# Patient Record
Sex: Male | Born: 1980
Health system: Southern US, Community
[De-identification: ages and names within clinical notes are randomized; demographics above are authoritative.]

## PROBLEM LIST (undated history)

## (undated) DIAGNOSIS — G8190 Hemiplegia, unspecified affecting unspecified side: Secondary | ICD-10-CM

## (undated) DIAGNOSIS — G8110 Spastic hemiplegia affecting unspecified side: Secondary | ICD-10-CM

## (undated) DIAGNOSIS — K219 Gastro-esophageal reflux disease without esophagitis: Secondary | ICD-10-CM

## (undated) DIAGNOSIS — N319 Neuromuscular dysfunction of bladder, unspecified: Secondary | ICD-10-CM

## (undated) DIAGNOSIS — Z720 Tobacco use: Secondary | ICD-10-CM

## (undated) DIAGNOSIS — I639 Cerebral infarction, unspecified: Secondary | ICD-10-CM

## (undated) DIAGNOSIS — E7211 Homocystinuria: Secondary | ICD-10-CM

## (undated) DIAGNOSIS — R4701 Aphasia: Secondary | ICD-10-CM

---

## 2018-02-07 DIAGNOSIS — N319 Neuromuscular dysfunction of bladder, unspecified: Secondary | ICD-10-CM

## 2018-02-07 DIAGNOSIS — G8190 Hemiplegia, unspecified affecting unspecified side: Secondary | ICD-10-CM

## 2018-02-07 HISTORY — DX: Hemiplegia, unspecified affecting unspecified side: G81.90

## 2018-02-07 HISTORY — DX: Neuromuscular dysfunction of bladder, unspecified: N31.9

## 2018-02-19 ENCOUNTER — Emergency Department (HOSPITAL_COMMUNITY): Payer: BLUE CROSS/BLUE SHIELD

## 2018-02-19 ENCOUNTER — Inpatient Hospital Stay (HOSPITAL_COMMUNITY): Payer: BLUE CROSS/BLUE SHIELD

## 2018-02-19 ENCOUNTER — Other Ambulatory Visit: Payer: Self-pay

## 2018-02-19 ENCOUNTER — Encounter (HOSPITAL_COMMUNITY): Payer: Self-pay

## 2018-02-19 ENCOUNTER — Encounter (HOSPITAL_COMMUNITY)
Admission: EM | Disposition: A | Discharge status: Rehabilitation Facility | Payer: Self-pay | Source: Home / Self Care | Attending: Neurology

## 2018-02-19 ENCOUNTER — Emergency Department (HOSPITAL_COMMUNITY): Payer: BLUE CROSS/BLUE SHIELD | Admitting: Anesthesiology

## 2018-02-19 ENCOUNTER — Inpatient Hospital Stay (HOSPITAL_COMMUNITY)
Admission: EM | Admit: 2018-02-19 | Discharge: 2018-03-01 | DRG: 023 | Disposition: A | Discharge status: Rehabilitation Facility | Payer: BLUE CROSS/BLUE SHIELD | Attending: Neurology | Admitting: Neurology

## 2018-02-19 DIAGNOSIS — I7771 Dissection of carotid artery: Secondary | ICD-10-CM | POA: Diagnosis not present

## 2018-02-19 DIAGNOSIS — R29718 NIHSS score 18: Secondary | ICD-10-CM | POA: Diagnosis not present

## 2018-02-19 DIAGNOSIS — G8110 Spastic hemiplegia affecting unspecified side: Secondary | ICD-10-CM | POA: Diagnosis not present

## 2018-02-19 DIAGNOSIS — I358 Other nonrheumatic aortic valve disorders: Secondary | ICD-10-CM

## 2018-02-19 DIAGNOSIS — G8191 Hemiplegia, unspecified affecting right dominant side: Secondary | ICD-10-CM

## 2018-02-19 DIAGNOSIS — I69390 Apraxia following cerebral infarction: Secondary | ICD-10-CM | POA: Diagnosis not present

## 2018-02-19 DIAGNOSIS — N319 Neuromuscular dysfunction of bladder, unspecified: Secondary | ICD-10-CM | POA: Diagnosis not present

## 2018-02-19 DIAGNOSIS — I63412 Cerebral infarction due to embolism of left middle cerebral artery: Secondary | ICD-10-CM

## 2018-02-19 DIAGNOSIS — D72829 Elevated white blood cell count, unspecified: Secondary | ICD-10-CM | POA: Diagnosis not present

## 2018-02-19 DIAGNOSIS — I63312 Cerebral infarction due to thrombosis of left middle cerebral artery: Secondary | ICD-10-CM | POA: Diagnosis not present

## 2018-02-19 DIAGNOSIS — I6602 Occlusion and stenosis of left middle cerebral artery: Secondary | ICD-10-CM | POA: Diagnosis not present

## 2018-02-19 DIAGNOSIS — G9340 Encephalopathy, unspecified: Secondary | ICD-10-CM | POA: Diagnosis not present

## 2018-02-19 DIAGNOSIS — I63512 Cerebral infarction due to unspecified occlusion or stenosis of left middle cerebral artery: Secondary | ICD-10-CM | POA: Diagnosis not present

## 2018-02-19 DIAGNOSIS — F17210 Nicotine dependence, cigarettes, uncomplicated: Secondary | ICD-10-CM | POA: Diagnosis not present

## 2018-02-19 DIAGNOSIS — L723 Sebaceous cyst: Secondary | ICD-10-CM | POA: Diagnosis not present

## 2018-02-19 DIAGNOSIS — J9601 Acute respiratory failure with hypoxia: Secondary | ICD-10-CM | POA: Diagnosis not present

## 2018-02-19 DIAGNOSIS — I6522 Occlusion and stenosis of left carotid artery: Secondary | ICD-10-CM | POA: Diagnosis not present

## 2018-02-19 DIAGNOSIS — E7211 Homocystinuria: Secondary | ICD-10-CM | POA: Diagnosis not present

## 2018-02-19 DIAGNOSIS — I69351 Hemiplegia and hemiparesis following cerebral infarction affecting right dominant side: Secondary | ICD-10-CM | POA: Diagnosis not present

## 2018-02-19 DIAGNOSIS — I639 Cerebral infarction, unspecified: Secondary | ICD-10-CM | POA: Diagnosis not present

## 2018-02-19 DIAGNOSIS — Z823 Family history of stroke: Secondary | ICD-10-CM

## 2018-02-19 DIAGNOSIS — I6300 Cerebral infarction due to thrombosis of unspecified precerebral artery: Secondary | ICD-10-CM | POA: Diagnosis not present

## 2018-02-19 DIAGNOSIS — R1312 Dysphagia, oropharyngeal phase: Secondary | ICD-10-CM | POA: Diagnosis present

## 2018-02-19 DIAGNOSIS — K219 Gastro-esophageal reflux disease without esophagitis: Secondary | ICD-10-CM | POA: Diagnosis not present

## 2018-02-19 DIAGNOSIS — R1310 Dysphagia, unspecified: Secondary | ICD-10-CM | POA: Diagnosis not present

## 2018-02-19 DIAGNOSIS — I63032 Cerebral infarction due to thrombosis of left carotid artery: Secondary | ICD-10-CM | POA: Diagnosis not present

## 2018-02-19 DIAGNOSIS — Z9289 Personal history of other medical treatment: Secondary | ICD-10-CM

## 2018-02-19 DIAGNOSIS — F17200 Nicotine dependence, unspecified, uncomplicated: Secondary | ICD-10-CM | POA: Diagnosis present

## 2018-02-19 DIAGNOSIS — J9690 Respiratory failure, unspecified, unspecified whether with hypoxia or hypercapnia: Secondary | ICD-10-CM

## 2018-02-19 DIAGNOSIS — I69391 Dysphagia following cerebral infarction: Secondary | ICD-10-CM | POA: Diagnosis not present

## 2018-02-19 DIAGNOSIS — Z452 Encounter for adjustment and management of vascular access device: Secondary | ICD-10-CM | POA: Diagnosis not present

## 2018-02-19 DIAGNOSIS — I63511 Cerebral infarction due to unspecified occlusion or stenosis of right middle cerebral artery: Secondary | ICD-10-CM | POA: Diagnosis not present

## 2018-02-19 DIAGNOSIS — R7881 Bacteremia: Secondary | ICD-10-CM | POA: Diagnosis not present

## 2018-02-19 DIAGNOSIS — I359 Nonrheumatic aortic valve disorder, unspecified: Secondary | ICD-10-CM | POA: Diagnosis not present

## 2018-02-19 DIAGNOSIS — I69332 Monoplegia of upper limb following cerebral infarction affecting left dominant side: Secondary | ICD-10-CM | POA: Diagnosis not present

## 2018-02-19 DIAGNOSIS — R0689 Other abnormalities of breathing: Secondary | ICD-10-CM | POA: Diagnosis not present

## 2018-02-19 DIAGNOSIS — I63413 Cerebral infarction due to embolism of bilateral middle cerebral arteries: Principal | ICD-10-CM | POA: Diagnosis present

## 2018-02-19 DIAGNOSIS — E876 Hypokalemia: Secondary | ICD-10-CM | POA: Diagnosis not present

## 2018-02-19 DIAGNOSIS — E870 Hyperosmolality and hypernatremia: Secondary | ICD-10-CM | POA: Diagnosis not present

## 2018-02-19 DIAGNOSIS — L729 Follicular cyst of the skin and subcutaneous tissue, unspecified: Secondary | ICD-10-CM | POA: Diagnosis not present

## 2018-02-19 DIAGNOSIS — Z4682 Encounter for fitting and adjustment of non-vascular catheter: Secondary | ICD-10-CM | POA: Diagnosis not present

## 2018-02-19 DIAGNOSIS — E871 Hypo-osmolality and hyponatremia: Secondary | ICD-10-CM | POA: Diagnosis not present

## 2018-02-19 DIAGNOSIS — I9581 Postprocedural hypotension: Secondary | ICD-10-CM | POA: Diagnosis not present

## 2018-02-19 DIAGNOSIS — R130 Aphagia: Secondary | ICD-10-CM | POA: Diagnosis not present

## 2018-02-19 DIAGNOSIS — R1311 Dysphagia, oral phase: Secondary | ICD-10-CM | POA: Diagnosis present

## 2018-02-19 DIAGNOSIS — E538 Deficiency of other specified B group vitamins: Secondary | ICD-10-CM | POA: Diagnosis not present

## 2018-02-19 DIAGNOSIS — R339 Retention of urine, unspecified: Secondary | ICD-10-CM | POA: Diagnosis not present

## 2018-02-19 DIAGNOSIS — G935 Compression of brain: Secondary | ICD-10-CM | POA: Diagnosis not present

## 2018-02-19 DIAGNOSIS — I6389 Other cerebral infarction: Secondary | ICD-10-CM | POA: Diagnosis not present

## 2018-02-19 DIAGNOSIS — R29810 Facial weakness: Secondary | ICD-10-CM | POA: Diagnosis not present

## 2018-02-19 DIAGNOSIS — R0902 Hypoxemia: Secondary | ICD-10-CM | POA: Diagnosis not present

## 2018-02-19 DIAGNOSIS — Z789 Other specified health status: Secondary | ICD-10-CM | POA: Diagnosis not present

## 2018-02-19 DIAGNOSIS — R29818 Other symptoms and signs involving the nervous system: Secondary | ICD-10-CM | POA: Diagnosis not present

## 2018-02-19 DIAGNOSIS — R296 Repeated falls: Secondary | ICD-10-CM | POA: Diagnosis present

## 2018-02-19 DIAGNOSIS — K592 Neurogenic bowel, not elsewhere classified: Secondary | ICD-10-CM | POA: Diagnosis not present

## 2018-02-19 DIAGNOSIS — I609 Nontraumatic subarachnoid hemorrhage, unspecified: Secondary | ICD-10-CM | POA: Diagnosis not present

## 2018-02-19 DIAGNOSIS — R4701 Aphasia: Secondary | ICD-10-CM | POA: Diagnosis present

## 2018-02-19 DIAGNOSIS — G936 Cerebral edema: Secondary | ICD-10-CM | POA: Diagnosis not present

## 2018-02-19 DIAGNOSIS — Z9889 Other specified postprocedural states: Secondary | ICD-10-CM | POA: Diagnosis not present

## 2018-02-19 DIAGNOSIS — I63232 Cerebral infarction due to unspecified occlusion or stenosis of left carotid arteries: Secondary | ICD-10-CM | POA: Diagnosis not present

## 2018-02-19 DIAGNOSIS — B957 Other staphylococcus as the cause of diseases classified elsewhere: Secondary | ICD-10-CM | POA: Diagnosis not present

## 2018-02-19 DIAGNOSIS — I69320 Aphasia following cerebral infarction: Secondary | ICD-10-CM | POA: Diagnosis not present

## 2018-02-19 DIAGNOSIS — I339 Acute and subacute endocarditis, unspecified: Secondary | ICD-10-CM | POA: Diagnosis not present

## 2018-02-19 DIAGNOSIS — I330 Acute and subacute infective endocarditis: Secondary | ICD-10-CM | POA: Diagnosis not present

## 2018-02-19 DIAGNOSIS — R159 Full incontinence of feces: Secondary | ICD-10-CM | POA: Diagnosis not present

## 2018-02-19 DIAGNOSIS — I10 Essential (primary) hypertension: Secondary | ICD-10-CM | POA: Diagnosis not present

## 2018-02-19 DIAGNOSIS — J984 Other disorders of lung: Secondary | ICD-10-CM | POA: Diagnosis not present

## 2018-02-19 DIAGNOSIS — J9811 Atelectasis: Secondary | ICD-10-CM | POA: Diagnosis not present

## 2018-02-19 DIAGNOSIS — Z23 Encounter for immunization: Secondary | ICD-10-CM | POA: Diagnosis not present

## 2018-02-19 DIAGNOSIS — L089 Local infection of the skin and subcutaneous tissue, unspecified: Secondary | ICD-10-CM | POA: Diagnosis not present

## 2018-02-19 HISTORY — PX: IR PERCUTANEOUS ART THROMBECTOMY/INFUSION INTRACRANIAL INC DIAG ANGIO: IMG6087

## 2018-02-19 HISTORY — PX: IR ANGIO VERTEBRAL SEL VERTEBRAL BILAT MOD SED: IMG5369

## 2018-02-19 HISTORY — PX: RADIOLOGY WITH ANESTHESIA: SHX6223

## 2018-02-19 HISTORY — PX: IR ANGIO INTRA EXTRACRAN SEL COM CAROTID INNOMINATE UNI R MOD SED: IMG5359

## 2018-02-19 HISTORY — PX: IR US GUIDE VASC ACCESS RIGHT: IMG2390

## 2018-02-19 HISTORY — PX: IR CT HEAD LTD: IMG2386

## 2018-02-19 HISTORY — DX: Tobacco use: Z72.0

## 2018-02-19 LAB — POCT I-STAT 3, ART BLOOD GAS (G3+)
Acid-base deficit: 6 mmol/L — ABNORMAL HIGH (ref 0.0–2.0)
Bicarbonate: 19.8 mmol/L — ABNORMAL LOW (ref 20.0–28.0)
O2 Saturation: 100 %
PCO2 ART: 37.9 mmHg (ref 32.0–48.0)
PO2 ART: 218 mmHg — AB (ref 83.0–108.0)
Patient temperature: 98.9
TCO2: 21 mmol/L — AB (ref 22–32)
pH, Arterial: 7.328 — ABNORMAL LOW (ref 7.350–7.450)

## 2018-02-19 LAB — COMPREHENSIVE METABOLIC PANEL
ALBUMIN: 3.4 g/dL — AB (ref 3.5–5.0)
ALT: 33 U/L (ref 0–44)
ANION GAP: 13 (ref 5–15)
AST: 28 U/L (ref 15–41)
Alkaline Phosphatase: 96 U/L (ref 38–126)
BILIRUBIN TOTAL: 1.1 mg/dL (ref 0.3–1.2)
BUN: <5 mg/dL — ABNORMAL LOW (ref 6–20)
CO2: 22 mmol/L (ref 22–32)
Calcium: 8.7 mg/dL — ABNORMAL LOW (ref 8.9–10.3)
Chloride: 105 mmol/L (ref 98–111)
Creatinine, Ser: 0.73 mg/dL (ref 0.61–1.24)
GFR calc Af Amer: >60 mL/min (ref >60)
GFR calc non Af Amer: >60 mL/min (ref >60)
GLUCOSE: 141 mg/dL — AB (ref 70–99)
POTASSIUM: 3.8 mmol/L (ref 3.5–5.1)
Sodium: 140 mmol/L (ref 135–145)
TOTAL PROTEIN: 7.2 g/dL (ref 6.5–8.1)

## 2018-02-19 LAB — CBC
HCT: 48.7 % (ref 39.0–52.0)
Hemoglobin: 18 g/dL — ABNORMAL HIGH (ref 13.0–17.0)
MCH: 39 pg — ABNORMAL HIGH (ref 26.0–34.0)
MCHC: 37 g/dL — ABNORMAL HIGH (ref 30.0–36.0)
MCV: 105.6 fL — AB (ref 78.0–100.0)
PLATELETS: 265 10*3/uL (ref 150–400)
RBC: 4.61 MIL/uL (ref 4.22–5.81)
RDW: 13 % (ref 11.5–15.5)
WBC: 15.4 10*3/uL — AB (ref 4.0–10.5)

## 2018-02-19 LAB — I-STAT CHEM 8, ED
BUN: <3 mg/dL — ABNORMAL LOW (ref 6–20)
CREATININE: 0.8 mg/dL (ref 0.61–1.24)
Calcium, Ion: 0.97 mmol/L — ABNORMAL LOW (ref 1.15–1.40)
Chloride: 101 mmol/L (ref 98–111)
GLUCOSE: 137 mg/dL — AB (ref 70–99)
HEMATOCRIT: 53 % — AB (ref 39.0–52.0)
HEMOGLOBIN: 18 g/dL — AB (ref 13.0–17.0)
Potassium: 3.5 mmol/L (ref 3.5–5.1)
SODIUM: 139 mmol/L (ref 135–145)
TCO2: 23 mmol/L (ref 22–32)

## 2018-02-19 LAB — DIFFERENTIAL
Abs Immature Granulocytes: 0.1 10*3/uL (ref 0.0–0.1)
BASOS ABS: 0.1 10*3/uL (ref 0.0–0.1)
Basophils Relative: 0 %
EOS ABS: 0 10*3/uL (ref 0.0–0.7)
EOS PCT: 0 %
IMMATURE GRANULOCYTES: 1 %
LYMPHS ABS: 2.6 10*3/uL (ref 0.7–4.0)
Lymphocytes Relative: 17 %
Monocytes Absolute: 0.9 10*3/uL (ref 0.1–1.0)
Monocytes Relative: 6 %
Neutro Abs: 11.8 10*3/uL — ABNORMAL HIGH (ref 1.7–7.7)
Neutrophils Relative %: 76 %

## 2018-02-19 LAB — RAPID URINE DRUG SCREEN, HOSP PERFORMED
AMPHETAMINES: NOT DETECTED
BENZODIAZEPINES: NOT DETECTED
Cocaine: NOT DETECTED
OPIATES: NOT DETECTED
Tetrahydrocannabinol: NOT DETECTED

## 2018-02-19 LAB — I-STAT TROPONIN, ED: TROPONIN I, POC: 0 ng/mL (ref 0.00–0.08)

## 2018-02-19 LAB — TRIGLYCERIDES: TRIGLYCERIDES: 130 mg/dL (ref <150)

## 2018-02-19 LAB — MRSA PCR SCREENING: MRSA by PCR: POSITIVE — AB

## 2018-02-19 LAB — PROTIME-INR
INR: 1.02
PROTHROMBIN TIME: 13.3 s (ref 11.4–15.2)

## 2018-02-19 LAB — FOLATE: Folate: 1.5 ng/mL — ABNORMAL LOW (ref >5.9)

## 2018-02-19 LAB — APTT: APTT: 29 s (ref 24–36)

## 2018-02-19 LAB — VITAMIN B12: Vitamin B-12: 224 pg/mL (ref 180–914)

## 2018-02-19 SURGERY — IR WITH ANESTHESIA
Anesthesia: General

## 2018-02-19 MED ORDER — IOHEXOL 300 MG/ML  SOLN: 100.0000 mL | Freq: Once | INTRAMUSCULAR | Status: DC | PRN

## 2018-02-19 MED ORDER — IOPAMIDOL (ISOVUE-370) INJECTION 76%
90.0000 mL | Freq: Once | INTRAVENOUS | Status: AC | PRN
  Administered 2018-02-19: 90 mL via INTRAVENOUS

## 2018-02-19 MED ORDER — PROPOFOL 10 MG/ML IV BOLUS
INTRAVENOUS | Status: DC | PRN
  Administered 2018-02-19: 50 mg via INTRAVENOUS
  Administered 2018-02-19: 130 mg via INTRAVENOUS

## 2018-02-19 MED ORDER — SODIUM CHLORIDE 0.9 % IV SOLN
INTRAVENOUS | Status: DC | PRN
  Administered 2018-02-19: 15 ug/min via INTRAVENOUS

## 2018-02-19 MED ORDER — NICARDIPINE HCL IN NACL 20-0.86 MG/200ML-% IV SOLN
0.0000 mg/h | INTRAVENOUS | Status: DC
  Filled 2018-02-19: qty 200

## 2018-02-19 MED ORDER — CHLORHEXIDINE GLUCONATE CLOTH 2 % EX PADS
6.0000 | MEDICATED_PAD | Freq: Every day | CUTANEOUS | Status: DC
  Administered 2018-02-21 – 2018-02-22 (×2): 6 via TOPICAL

## 2018-02-19 MED ORDER — ONDANSETRON HCL 4 MG/2ML IJ SOLN
INTRAMUSCULAR | Status: DC | PRN
  Administered 2018-02-19: 4 mg via INTRAVENOUS

## 2018-02-19 MED ORDER — CEFAZOLIN SODIUM-DEXTROSE 2-3 GM-%(50ML) IV SOLR
INTRAVENOUS | Status: DC | PRN
  Administered 2018-02-19: 2 g via INTRAVENOUS

## 2018-02-19 MED ORDER — DOCUSATE SODIUM 50 MG/5ML PO LIQD: 100.0000 mg | Freq: Two times a day (BID) | ORAL | Status: DC | PRN

## 2018-02-19 MED ORDER — LIDOCAINE HCL (CARDIAC) PF 100 MG/5ML IV SOSY
PREFILLED_SYRINGE | INTRAVENOUS | Status: DC | PRN
  Administered 2018-02-19: 60 mg via INTRAVENOUS

## 2018-02-19 MED ORDER — ORAL CARE MOUTH RINSE
15.0000 mL | OROMUCOSAL | Status: DC
  Administered 2018-02-19 – 2018-02-22 (×24): 15 mL via OROMUCOSAL

## 2018-02-19 MED ORDER — MUPIROCIN 2 % EX OINT
1.0000 "application " | TOPICAL_OINTMENT | Freq: Two times a day (BID) | CUTANEOUS | Status: AC
  Administered 2018-02-20 – 2018-02-24 (×9): 1 via NASAL
  Filled 2018-02-19 (×2): qty 22

## 2018-02-19 MED ORDER — CLEVIDIPINE BUTYRATE 0.5 MG/ML IV EMUL
0.0000 mg/h | INTRAVENOUS | Status: DC
  Administered 2018-02-19 – 2018-02-21 (×2): 1 mg/h via INTRAVENOUS
  Administered 2018-02-22: 5 mg/h via INTRAVENOUS
  Administered 2018-02-22: 6 mg/h via INTRAVENOUS
  Administered 2018-02-23: 2 mg/h via INTRAVENOUS
  Filled 2018-02-19 (×6): qty 50

## 2018-02-19 MED ORDER — ACETAMINOPHEN 650 MG RE SUPP: 650.0000 mg | RECTAL | Status: DC | PRN

## 2018-02-19 MED ORDER — TICAGRELOR 90 MG PO TABS
ORAL_TABLET | ORAL | Status: AC
  Filled 2018-02-19: qty 2

## 2018-02-19 MED ORDER — EPTIFIBATIDE 20 MG/10ML IV SOLN
INTRAVENOUS | Status: AC
  Filled 2018-02-19: qty 10

## 2018-02-19 MED ORDER — CEFAZOLIN SODIUM-DEXTROSE 2-4 GM/100ML-% IV SOLN
INTRAVENOUS | Status: AC
  Filled 2018-02-19: qty 100

## 2018-02-19 MED ORDER — LIDOCAINE HCL 1 % IJ SOLN
INTRAMUSCULAR | Status: AC
  Filled 2018-02-19: qty 20

## 2018-02-19 MED ORDER — FENTANYL CITRATE (PF) 100 MCG/2ML IJ SOLN
100.0000 ug | INTRAMUSCULAR | Status: DC | PRN
  Administered 2018-02-19 – 2018-02-21 (×10): 100 ug via INTRAVENOUS
  Filled 2018-02-19 (×9): qty 2

## 2018-02-19 MED ORDER — IOPAMIDOL (ISOVUE-300) INJECTION 61%
INTRAVENOUS | Status: AC
  Administered 2018-02-19: 40 mL
  Filled 2018-02-19: qty 50

## 2018-02-19 MED ORDER — STROKE: EARLY STAGES OF RECOVERY BOOK
Freq: Once | Status: AC
  Administered 2018-02-22: 1
  Filled 2018-02-19 (×2): qty 1

## 2018-02-19 MED ORDER — FENTANYL CITRATE (PF) 100 MCG/2ML IJ SOLN
INTRAMUSCULAR | Status: DC | PRN
  Administered 2018-02-19: 150 ug via INTRAVENOUS
  Administered 2018-02-19: 100 ug via INTRAVENOUS

## 2018-02-19 MED ORDER — ROCURONIUM BROMIDE 100 MG/10ML IV SOLN
INTRAVENOUS | Status: DC | PRN
  Administered 2018-02-19 (×2): 100 mg via INTRAVENOUS

## 2018-02-19 MED ORDER — ASPIRIN 325 MG PO TABS
ORAL_TABLET | ORAL | Status: AC
  Filled 2018-02-19: qty 1

## 2018-02-19 MED ORDER — PROPOFOL 500 MG/50ML IV EMUL
INTRAVENOUS | Status: DC | PRN
  Administered 2018-02-19: 50 ug/kg/min via INTRAVENOUS

## 2018-02-19 MED ORDER — FENTANYL CITRATE (PF) 250 MCG/5ML IJ SOLN
INTRAMUSCULAR | Status: AC
  Filled 2018-02-19: qty 5

## 2018-02-19 MED ORDER — SODIUM CHLORIDE 0.9 % IV SOLN
INTRAVENOUS | Status: DC
  Administered 2018-02-19 – 2018-02-20 (×2): via INTRAVENOUS
  Administered 2018-02-20: 75 mL/h via INTRAVENOUS
  Administered 2018-02-21: 02:00:00 via INTRAVENOUS

## 2018-02-19 MED ORDER — NITROGLYCERIN 1 MG/10 ML FOR IR/CATH LAB
INTRA_ARTERIAL | Status: AC
  Filled 2018-02-19: qty 10

## 2018-02-19 MED ORDER — FENTANYL CITRATE (PF) 100 MCG/2ML IJ SOLN
100.0000 ug | INTRAMUSCULAR | Status: AC | PRN
  Administered 2018-02-19 – 2018-02-21 (×3): 100 ug via INTRAVENOUS
  Filled 2018-02-19 (×4): qty 2

## 2018-02-19 MED ORDER — SODIUM CHLORIDE 0.9 % IV SOLN
INTRAVENOUS | Status: DC | PRN
  Administered 2018-02-19 (×2): via INTRAVENOUS

## 2018-02-19 MED ORDER — SUCCINYLCHOLINE CHLORIDE 20 MG/ML IJ SOLN
INTRAMUSCULAR | Status: DC | PRN
  Administered 2018-02-19: 80 mg via INTRAVENOUS

## 2018-02-19 MED ORDER — ALBUMIN HUMAN 5 % IV SOLN
INTRAVENOUS | Status: DC | PRN
  Administered 2018-02-19 (×2): via INTRAVENOUS

## 2018-02-19 MED ORDER — TIROFIBAN HCL IN NACL 5-0.9 MG/100ML-% IV SOLN
INTRAVENOUS | Status: AC
  Filled 2018-02-19: qty 100

## 2018-02-19 MED ORDER — DEXAMETHASONE SODIUM PHOSPHATE 10 MG/ML IJ SOLN
INTRAMUSCULAR | Status: DC | PRN
  Administered 2018-02-19: 10 mg via INTRAVENOUS

## 2018-02-19 MED ORDER — ACETAMINOPHEN 325 MG PO TABS: 650.0000 mg | ORAL_TABLET | ORAL | Status: DC | PRN

## 2018-02-19 MED ORDER — ACETAMINOPHEN 160 MG/5ML PO SOLN
650.0000 mg | ORAL | Status: DC | PRN
  Administered 2018-02-27: 650 mg
  Filled 2018-02-19: qty 20.3

## 2018-02-19 MED ORDER — PROPOFOL 1000 MG/100ML IV EMUL
0.0000 ug/kg/min | INTRAVENOUS | Status: DC
  Administered 2018-02-19: 50 ug/kg/min via INTRAVENOUS
  Administered 2018-02-19: 45 ug/kg/min via INTRAVENOUS
  Administered 2018-02-19: 25 ug/kg/min via INTRAVENOUS
  Administered 2018-02-20 (×3): 50 ug/kg/min via INTRAVENOUS
  Administered 2018-02-20: 45 ug/kg/min via INTRAVENOUS
  Administered 2018-02-20: 50 ug/kg/min via INTRAVENOUS
  Administered 2018-02-20: 45 ug/kg/min via INTRAVENOUS
  Administered 2018-02-20 – 2018-02-21 (×3): 50 ug/kg/min via INTRAVENOUS
  Filled 2018-02-19 (×11): qty 100

## 2018-02-19 MED ORDER — CHLORHEXIDINE GLUCONATE 0.12% ORAL RINSE (MEDLINE KIT)
15.0000 mL | Freq: Two times a day (BID) | OROMUCOSAL | Status: DC
  Administered 2018-02-19 – 2018-02-21 (×5): 15 mL via OROMUCOSAL

## 2018-02-19 MED ORDER — CLOPIDOGREL BISULFATE 300 MG PO TABS
ORAL_TABLET | ORAL | Status: AC
  Filled 2018-02-19: qty 1

## 2018-02-19 NOTE — Sedation Documentation (Signed)
Bed control called. Spoke with [facility]. Given bed assignment [id number].

## 2018-02-19 NOTE — Anesthesia Preprocedure Evaluation (Addendum)
Anesthesia Evaluation   Patient unresponsive    Reviewed: Patient's Chart, lab work & pertinent test results, Unable to perform ROS - Chart review onlyPreop documentation limited or incomplete due to emergent nature of procedure.  Airway Mallampati: II  TM Distance: >3 FB Neck ROM: Full   Comment: Difficult to examine  Dental no notable dental hx.    Pulmonary Current Smoker,    Pulmonary exam normal breath sounds clear to auscultation       Cardiovascular negative cardio ROS Normal cardiovascular exam     Neuro/Psych 1. Hypodensities left frontal lobe and left occipital parietal lobe most compatible with acute infarct. Possible emboli or watershed infarct. Negative for hemorrhage or mass-effect  Right sided weakness negative psych ROS   GI/Hepatic negative GI ROS, Neg liver ROS,   Endo/Other  negative endocrine ROS  Renal/GU negative Renal ROS     Musculoskeletal negative musculoskeletal ROS (+)   Abdominal   Peds  Hematology negative hematology ROS (+)   Anesthesia Other Findings code stroke  Reproductive/Obstetrics                           Anesthesia Physical Anesthesia Plan  ASA: III and emergent  Anesthesia Plan: General   Post-op Pain Management:    Induction: Intravenous  PONV Risk Score and Plan: 2 and Ondansetron, Dexamethasone and Treatment may vary due to age or medical condition  Airway Management Planned: Oral ETT  Additional Equipment: Arterial line  Intra-op Plan:   Post-operative Plan: Extubation in OR and Possible Post-op intubation/ventilation  Informed Consent: I have reviewed the patients History and Physical, chart, labs and discussed the procedure including the risks, benefits and alternatives for the proposed anesthesia with the patient or authorized representative who has indicated his/her understanding and acceptance.   Dental advisory given  Plan  Discussed with: CRNA  Anesthesia Plan Comments:        Anesthesia Quick Evaluation

## 2018-02-19 NOTE — Progress Notes (Signed)
Pt was incredibly agitated during trip to ct and required quite a lot of sedation in order to accurately obtain the CT scan that was emergently ordered .  Since arrival back onto the unit the pt has calmed down a lot more.  Pt still appears to be aphasic and not able to follow commands but is moving all of his extremities purposefully.

## 2018-02-19 NOTE — Sedation Documentation (Signed)
R groin and distal pulses assessed at bedside with Daine Floras, RN. See flowsheet. All questions answered. Care transferred to 4N.

## 2018-02-19 NOTE — Anesthesia Postprocedure Evaluation (Signed)
Anesthesia Post Note  Patient: Xavier Villegas  Procedure(s) Performed: IR WITH ANESTHESIA CODE STROKE (N/A )     Patient location during evaluation: SICU Anesthesia Type: General Level of consciousness: sedated Pain management: pain level controlled Vital Signs Assessment: post-procedure vital signs reviewed and stable Respiratory status: patient remains intubated per anesthesia plan Cardiovascular status: stable Postop Assessment: no apparent nausea or vomiting Anesthetic complications: no    Last Vitals:  Vitals:   02/19/18 1935 02/19/18 2000  BP:  114/71  Pulse: (!) 115 96  Resp: 16 18  Temp:  36.6 C  SpO2: 100% 99%    Last Pain:  Vitals:   02/19/18 2000  TempSrc: Axillary                 Kimori Tartaglia P Rinaldo Macqueen

## 2018-02-19 NOTE — Progress Notes (Signed)
Transported pt from CT to 4N on vent, full support 100% FIO2. Tolerated well and stable throughout.

## 2018-02-19 NOTE — Progress Notes (Addendum)
NeuroInterventional Radiology Pre-Procedure Note  History: 37 yo male with history of acute left MCA stroke, ELVO, NIHSS of 18.   He was last known well last night, outside window for tPA  Baseline mRS: 0 NIHSS:   18  CT ASPECTS: 8 CTA:   M1 occlusion, with left ICA dissection CTP:   Shows evidence of core infarct larger than the aspects reflects   The patient is a young otherwise health male with apparent embolic stroke to the left MCA secondary to presumably spontaneous left ICA dissection.  He is otherwise healthy, with no known risk factors.   He is outside of window for tPA, with dissection.   He stands to have a large infarction of the left MCA territory.   There is some inconsistency with his imaging data set, with an ASPECTS of 8, and CTP showing evidence of infarction larger than the ASPECTS would suggest.  This could mean he is progressing fast, however, he has been without MCA flow since last night.    He is within timeframe for intervention, but does have risk for reperfusion malignant edema/hemorrhage.    Dr. Leonel Ramsay and I discussed options with the family.  In this difficult decision to proceed with thrombectomy or use standard medical care, they decided within their best assessment that their family member would prefer to be aggressive and give himself the best chance for maintaining flow and preserving function.    Given our discussion and the patient's symptoms/baseline/age, we decided to proceed with mechanical thrombectomy.    The risks and benefits of the procedure were discussed with the patient/patient's family, with specific risks including: bleeding, infection, arterial injury/dissection, contrast reaction, kidney injury, need for further procedure/surgery, neurologic deficit, 10-15% risk or greater of intracranial hemorrhage/edema, cardiopulmonary collapse, death. All questions were answered.  The patient/family would like to proceed with attempt at  thrombectomy.   Plan for cerebral angiogram and attempt at mechanical thrombectomy.   Signed,  Dulcy Fanny. Earleen Newport, DO

## 2018-02-19 NOTE — ED Notes (Signed)
Patient arrived to IR, immediately transported to [facility] decision Rapid RN, and Primary ED RN is at bedside

## 2018-02-19 NOTE — Sedation Documentation (Signed)
4N Charge, Sonia Baller, RN, called. Updated on pts progress, condition and plan of care. Verified bed assignment. Receiving RN will be Susie.

## 2018-02-19 NOTE — H&P (Signed)
Neurology H&P  CC: Right sided weakness  History is obtained from:patient  HPI: Xavier Villegas is a 37 yo M with with no PMH who presents with right sided weakness and aphasia present on awakening. There was some question per EMS of some hand symptoms for the past week, but this is unclear as no family present at this time were aware of it. He was still normal at bedtime, however, then was found on the floor this morning by his girlfriend.   EMS activated a code stroke and he was treated as unclear LKW. His CT had aspect score of 8, but perfusion was suggestive of large area of infarct. Due to this discrepancy, he was taken to MRI for consideration of IR if his infarct burden appeared less than estimated by CTP.   Unfortunately, due to other patient care issues(emergency in a PICU patient), it would have been a significant delay to get an MRI. After discussion amongst the team, it was felt that given the patient's young age and relatively preserved ASPECT score, he was taken for intervention.   LKW: prior to bed, unclear exact time.  tpa given?: no, out of window Modified Rankin Scale: 0-Completely asymptomatic and back to baseline post- stroke  ROS:  Unable to obtain due to altered mental status.   PMH: None   FHx: Unable to assess secondary to patient's altered mental status.    Social History:  has no tobacco, alcohol, and drug history on file.   Exam: Current vital signs: There were no vitals taken for this visit. Vital signs in last 24 hours:    Physical Exam  Constitutional: Appears well-developed and well-nourished.  Psych: Affect appropriate to situation Eyes: No scleral injection HENT: No OP obstrucion Head: Normocephalic.  Cardiovascular: Normal rate and regular rhythm.  Respiratory: Effort normal and breath sounds normal to anterior ascultation GI: Soft.  No distension. There is no tenderness.  Skin: WDI  Neuro: Mental Status: Patient is awake, alert,  densely aphasic. When asked to given a thumbs up, he holds his hand out, but does not give thumbs up, when asked to stick out his tongue, he opens his mouth, but does not stick out tongue.  Cranial Nerves: II: Does not blink to threat. Pupils are equal, round, and reactive to light.   III,IV, VI: L gaze preference V: Facial sensation is symmetric to temperature VII: Facial movement with right weakness VIII: hearing is intact to voice Motor: He has little movement of his right arm, does lift right leg against graivty. 5/5 on the left.  Sensory: Sensation is diminished on the right  Cerebellar: Does not perform.    I have reviewed labs in epic and the results pertinent to this consultation are: CMP - unremarkable  I have reviewed the images obtained:CT head - aspect 8, CTP - estimates large infarct core, but still with 40cc penumbra even by CTP.   Primary Diagnosis:  Cerebral infarction due to embolism of right middle cerebral artery  Secondary Diagnosis: Cerebral edema   Impression: 37 yo F with R MCA occlusion. He was taken for IR as described above. His post-operative scan demonstrates some contrast staining and some patchy hemorrhage. He did have an occlusive ICA dissection, but given the risk of dual anti-platelets with ICH, was not stented. .   Recommendations: - HgbA1c, fasting lipid panel - MRI of the brain without contrast - Frequent neuro checks - Echocardiogram - Prophylactic therapy-none due to [place] - Risk factor modification - Telemetry monitoring -  PT consult, OT consult, Speech consult - Stroke team to follow    This patient is critically ill and at significant risk of neurological worsening, death and care requires constant monitoring of vital signs, hemodynamics,respiratory and cardiac monitoring, neurological assessment, discussion with family, other specialists and medical decision making of high complexity. I spent 100 minutes of neurocritical care time  in  the care of  this patient.  Roland Rack, MD [facility] [phone]  If 7pm- 7am, please page neurology on call as listed in [place]. 02/19/2018  12:24 PM

## 2018-02-19 NOTE — Anesthesia Procedure Notes (Signed)
Procedure Name: Intubation Date/Time: 02/19/2018 1:12 PM Performed by: Lance Coon, CRNA Pre-anesthesia Checklist: Patient identified, Emergency Drugs available, Suction available, Patient being monitored and Timeout performed Patient Re-evaluated:Patient Re-evaluated prior to induction Oxygen Delivery Method: Circle system utilized Preoxygenation: Pre-oxygenation with 100% oxygen Induction Type: IV induction and Rapid sequence Laryngoscope Size: Miller and 3 Grade View: Grade I Tube type: Subglottic suction tube Tube size: 8.0 mm Number of attempts: 1 Airway Equipment and Method: Stylet Placement Confirmation: ETT inserted through vocal cords under direct vision,  positive ETCO2 and breath sounds checked- equal and bilateral Secured at: 22 cm Tube secured with: Tape Dental Injury: Teeth and Oropharynx as per pre-operative assessment

## 2018-02-19 NOTE — Anesthesia Procedure Notes (Signed)
Arterial Line Insertion Start/End7/13/2019 1:10 PM, 02/19/2018 1:15 PM Performed by: Murvin Natal, MD, anesthesiologist  Patient location: OR. Preanesthetic checklist: patient identified, IV checked, site marked, surgical consent, monitors and equipment checked, pre-op evaluation, timeout performed and anesthesia consent Emergency situation Lidocaine 1% used for infiltration Left, radial was placed Catheter size: 20 Fr Hand hygiene performed  and maximum sterile barriers used   Attempts: 2 (first attempt by CRNA) Procedure performed without using ultrasound guided technique. Following insertion, dressing applied. Post procedure assessment: normal and unchanged  Patient tolerated the procedure well with no immediate complications.

## 2018-02-19 NOTE — Sedation Documentation (Addendum)
[facility] called to [employer], Therapist, sports.

## 2018-02-19 NOTE — Procedures (Signed)
Neuro-Interventional Radiology Post Cerebral Angiogram Procedure Note  History:  37 yo male presents with acute left MCA ELVO, outside of window for tPA.    Baseline mRS:  0 NIHSS:   21 Last Known Well:  "last night" ASPECTS:   8 Anesthesia    GETA IA tPA:    [No] IA Medication:  [No] Proximal or Distal:  Proximal left M1/MCA occlusion Post TICI Score:  TICI 2b  Device/time:    1st    ADAPT technique with ACE 68 13:50 2nd    Solitaire 4x40 14:01 3rd    Solitaire 4x40  14:24  Procedure:  US guided right CFA access Cerebral angiogram Mechanical thrombectomy of left M1/MCA occlusion Flat panel CT in room Closure with Angioseal device  Findings:  Left proximal cervical ICA dissection/thrombus Left M1 occlusion First pass, TICI 2a, with emboli in distal M1/M2 Second pass, TICI 2a, with persistent occlusion of M2 branch Third pass, TICI 2b  Occlusive left ICA dissection, with patent ACA and maintained cross-flow from right to left Posterior circulation perfuses the left temporal and occipital region  Flat panel CT shows SAH/ICH  Complications: ICH  Recommendations: - Right hip straight x 4 hours - admit to ICU/neurology - remain intubated - CT now - pressure below 140 SBP   Signed,  Dulcy Fanny. Earleen Newport, DO

## 2018-02-19 NOTE — ED Notes (Signed)
Patient arrived from home with [facility].  Per family patient has "problems with hand for a week, he fell on the floor last night, his girlfriend got him up into the bed, and he fell again, this morning, she could not get him up, so she called EMS". Unable to determine Last well known-time because girlfriend states that LKW is "last night"

## 2018-02-19 NOTE — ED Notes (Signed)
Patient transferred to IR staff

## 2018-02-19 NOTE — Consult Note (Signed)
PULMONARY / CRITICAL CARE MEDICINE   Name: Xavier Villegas MRN: [id number] DOB: 1981-07-06    ADMISSION DATE:  02/19/2018 CONSULTATION DATE: 02/19/2018  REFERRING MD:  Rosette Reveal  CHIEF COMPLAINT:  Right sided weakness and aphasia  HISTORY OF PRESENT ILLNESS:   37 yo male otherwise healthy coming in due to multiple falls. Unfortunately patient was sedated on the ventilator and no family around and most of the histroy was taken from the chart and staff. Patient had a sudden weakness last night and had to use assistance to go to his room after he fell on the floor. This morning his girlfriend could not get him up so he was brought in as a code stroke. CT showed left MCA stroke with Left ICA dissection with proximal left M1/MCA occlusion where he was taken for thrombectomy and post procedure CT showed SAH and hemorrhagic transformation. Patient came to the ICU intubated and paralyzed.   PAST MEDICAL HISTORY :  No Known past medical history    No Known Allergies  Medications: None   No current facility-administered medications on file prior to encounter.    No current outpatient medications on file prior to encounter.    FAMILY HISTORY:  Could not be obtained due to altered mental status  SOCIAL HISTORY: Could not be obtained due to sedation    REVIEW OF SYSTEMS:   Could not be obtained due to sedation    VITAL SIGNS: BP 125/81   Pulse 88   Temp 98.2 F (36.8 C) (Axillary)   Resp 16   Ht 6' (1.829 m)   Wt 83 kg (182 lb 15.7 oz)   SpO2 100%   BMI 24.82 kg/m   HEMODYNAMICS:    VENTILATOR SETTINGS: Vent Mode: PRVC FiO2 (%):  [100 %] 100 % Set Rate:  [16 bmp] 16 bmp Vt Set:  [580 mL] 580 mL PEEP:  [5 cmH20] 5 cmH20 Plateau Pressure:  [17 cmH20] 17 cmH20  INTAKE / OUTPUT: No intake/output data recorded.  PHYSICAL EXAMINATION: General: sedated acutely ill on mechanical ventilation Neuro:  Patient just received paralytics flacid pupils are 4 mm equal   HEENT: intubated  Cardiovascular: normal heart sounds no murmurs Lungs: clear equal air sounds bilaterally no wheezing  Abdomen:  Soft no guarding no organoemgaly  Musculoskeletal:  No edema  Skin:  No rash   LABS:  BMET Recent Labs  Lab 02/19/18 1215 02/19/18 1219  NA 140 139  K 3.8 3.5  CL 105 101  CO2 22  --   BUN <5* <3*  CREATININE 0.73 0.80  GLUCOSE 141* 137*    Electrolytes Recent Labs  Lab 02/19/18 1215  CALCIUM 8.7*    CBC Recent Labs  Lab 02/19/18 1215 02/19/18 1219  WBC 15.4*  --   HGB 18.0* 18.0*  HCT 48.7 53.0*  PLT 265  --     Coag's Recent Labs  Lab 02/19/18 1215  APTT 29  INR 1.02    Sepsis Markers No results for input(s): LATICACIDVEN, PROCALCITON, O2SATVEN in the last 168 hours.  ABG No results for input(s): PHART, PCO2ART, PO2ART in the last 168 hours.  Liver Enzymes Recent Labs  Lab 02/19/18 1215  AST 28  ALT 33  ALKPHOS 96  BILITOT 1.1  ALBUMIN 3.4*    Cardiac Enzymes No results for input(s): TROPONINI, PROBNP in the last 168 hours.  Glucose No results for input(s): GLUCAP in the last 168 hours.  Imaging Ct Angio Head W Or Wo Contrast  Result Date:  02/19/2018 CLINICAL DATA:  Stroke.  Right-sided weakness aphasia EXAM: CT ANGIOGRAPHY HEAD AND NECK CT PERFUSION BRAIN TECHNIQUE: Multidetector CT imaging of the head and neck was performed using the standard protocol during bolus administration of intravenous contrast. Multiplanar CT image reconstructions and MIPs were obtained to evaluate the vascular anatomy. Carotid stenosis measurements (when applicable) are obtained utilizing NASCET criteria, using the distal internal carotid diameter as the denominator. Multiphase CT imaging of the brain was performed following IV bolus contrast injection. Subsequent parametric perfusion maps were calculated using RAPID software. CONTRAST:  84mL ISOVUE-370 IOPAMIDOL (ISOVUE-370) INJECTION 76% COMPARISON:  CT head 02/19/2018 FINDINGS:  CTA NECK FINDINGS Aortic arch: Normal aortic arch without atherosclerotic disease or aneurysm. Proximal great vessels normal Right carotid system: Normal right carotid. No evidence of atherosclerotic disease dissection or stenosis Left carotid system: Irregularity and stenosis of the proximal left internal carotid artery compatible with dissection. No significant calcification. Intraluminal filling defect compatible with thrombus. Left internal carotid artery small in caliber but patent to the cavernous segment. There is occlusion of the supraclinoid internal carotid artery on the left. Vertebral arteries: Normal Skeleton: Normal.  Periapical cyst around right upper incisor Other neck: Negative for mass or adenopathy. Upper chest: Negative Review of the MIP images confirms the above findings CTA HEAD FINDINGS Anterior circulation: Slow flow with small caliber of the left cavernous carotid which then occludes in the supraclinoid component. Occlusion of the proximal left A1. Occlusion of left M1 segment. Occlusion of left M2 branches diffusely. Right middle cerebral artery widely patent. A2 segments patent bilaterally. Posterior circulation: Both vertebral arteries patent to the basilar. Left vertebral dominant. PICA patent bilaterally. Basilar patent. Superior cerebellar and posterior cerebral arteries patent bilaterally. Venous sinuses: Negative Anatomic variants: None Delayed phase: Not performed Review of the MIP images confirms the above findings CT Brain Perfusion Findings: CBF (<30%) Volume: 133mL Perfusion (Tmax>6.0s) volume: 135mL Mismatch Volume: 61mL Infarction Location:Left MCA territory including left basal ganglia. IMPRESSION: Large territory left MCA acute infarct. 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 Occlusion left A1 with reconstitution left A2. Occlusion left M1 and M2  segments. Call is currently in for Dr. Leonides Schanz. Electronically Signed   By: Franchot Gallo M.D.   On: 02/19/2018 12:59   Dg Chest 1 View  Result Date: 02/19/2018 CLINICAL DATA:  37 year old male with history of stroke. Post procedure chest x-ray. EXAM: CHEST  1 VIEW COMPARISON:  No priors. FINDINGS: An endotracheal tube is in place with tip 3.1 cm above the carina. Nasogastric tube extends into the proximal stomach. Ill-defined opacity in the right lung base which is coincident with the in divisum tubing, potentially artifactual. Lung volumes are low. No other definite consolidative airspace disease. No pleural effusions. No evidence of pulmonary edema. Heart size is normal. Upper mediastinal contours are within normal limits. IMPRESSION: 1. Support apparatus, as above. 2. Low lung volumes without definite radiographic evidence of acute cardiopulmonary disease. Electronically Signed   By: Vinnie Langton M.D.   On: 02/19/2018 16:24   Ct Head Wo Contrast  Result Date: 02/19/2018 CLINICAL DATA:  Stroke post intervention. EXAM: CT HEAD WITHOUT CONTRAST TECHNIQUE: Contiguous axial images were obtained from the base of the skull through the vertex without intravenous contrast. COMPARISON:  CT head 02/19/2018 FINDINGS: Brain: Large territory left MCA infarct has become more evident on CT with diffuse low-density throughout the left MCA territory including the  basal ganglia. Interval development of patchy areas of acute hemorrhage within the infarct involving the left frontal, left parietal, and left insular region. Chronic hemorrhage also in the left lateral basal ganglia. High density in the left posterior insula likely represents extravasation of contrast. Mild local mass-effect without midline shift. Ventricle size remains normal. No other acute infarct. Vascular: Diffuse arterial enhancement due to prior angiography in CTA Skull: Negative Sinuses/Orbits: Mild mucosal edema paranasal sinuses. Negative orbit  Other: None IMPRESSION: Large territory acute infarct left MCA territory. Interval development of multiple areas of patchy hemorrhage within the infarct following clot retrieval. No midline shift. The images were reviewed with Dr. Earleen Newport. Electronically Signed   By: Franchot Gallo M.D.   On: 02/19/2018 16:18   Ct Angio Neck W Or Wo Contrast  Result Date: 02/19/2018 CLINICAL DATA:  Stroke.  Right-sided weakness aphasia EXAM: CT ANGIOGRAPHY HEAD AND NECK CT PERFUSION BRAIN TECHNIQUE: Multidetector CT imaging of the head and neck was performed using the standard protocol during bolus administration of intravenous contrast. Multiplanar CT image reconstructions and MIPs were obtained to evaluate the vascular anatomy. Carotid stenosis measurements (when applicable) are obtained utilizing NASCET criteria, using the distal internal carotid diameter as the denominator. Multiphase CT imaging of the brain was performed following IV bolus contrast injection. Subsequent parametric perfusion maps were calculated using RAPID software. CONTRAST:  10mL ISOVUE-370 IOPAMIDOL (ISOVUE-370) INJECTION 76% COMPARISON:  CT head 02/19/2018 FINDINGS: CTA NECK FINDINGS Aortic arch: Normal aortic arch without atherosclerotic disease or aneurysm. Proximal great vessels normal Right carotid system: Normal right carotid. No evidence of atherosclerotic disease dissection or stenosis Left carotid system: Irregularity and stenosis of the proximal left internal carotid artery compatible with dissection. No significant calcification. Intraluminal filling defect compatible with thrombus. Left internal carotid artery small in caliber but patent to the cavernous segment. There is occlusion of the supraclinoid internal carotid artery on the left. Vertebral arteries: Normal Skeleton: Normal.  Periapical cyst around right upper incisor Other neck: Negative for mass or adenopathy. Upper chest: Negative Review of the MIP images confirms the above findings  CTA HEAD FINDINGS Anterior circulation: Slow flow with small caliber of the left cavernous carotid which then occludes in the supraclinoid component. Occlusion of the proximal left A1. Occlusion of left M1 segment. Occlusion of left M2 branches diffusely. Right middle cerebral artery widely patent. A2 segments patent bilaterally. Posterior circulation: Both vertebral arteries patent to the basilar. Left vertebral dominant. PICA patent bilaterally. Basilar patent. Superior cerebellar and posterior cerebral arteries patent bilaterally. Venous sinuses: Negative Anatomic variants: None Delayed phase: Not performed Review of the MIP images confirms the above findings CT Brain Perfusion Findings: CBF (<30%) Volume: 157mL Perfusion (Tmax>6.0s) volume: 169mL Mismatch Volume: 68mL Infarction Location:Left MCA territory including left basal ganglia. IMPRESSION: Large territory left MCA acute infarct. 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 Occlusion left A1 with reconstitution left A2. Occlusion left M1 and M2 segments. Call is currently in for Dr. Leonides Schanz. Electronically Signed   By: Franchot Gallo M.D.   On: 02/19/2018 12:59   Ct Cerebral Perfusion W Contrast  Result Date: 02/19/2018 CLINICAL DATA:  Stroke.  Right-sided weakness aphasia EXAM: CT ANGIOGRAPHY HEAD AND NECK CT PERFUSION BRAIN TECHNIQUE: Multidetector CT imaging of the head and neck was performed using the standard protocol during bolus administration of intravenous contrast. Multiplanar CT image reconstructions and MIPs were obtained to evaluate the  vascular anatomy. Carotid stenosis measurements (when applicable) are obtained utilizing NASCET criteria, using the distal internal carotid diameter as the denominator. Multiphase CT imaging of the brain was performed following IV bolus contrast injection. Subsequent parametric perfusion  maps were calculated using RAPID software. CONTRAST:  36mL ISOVUE-370 IOPAMIDOL (ISOVUE-370) INJECTION 76% COMPARISON:  CT head 02/19/2018 FINDINGS: CTA NECK FINDINGS Aortic arch: Normal aortic arch without atherosclerotic disease or aneurysm. Proximal great vessels normal Right carotid system: Normal right carotid. No evidence of atherosclerotic disease dissection or stenosis Left carotid system: Irregularity and stenosis of the proximal left internal carotid artery compatible with dissection. No significant calcification. Intraluminal filling defect compatible with thrombus. Left internal carotid artery small in caliber but patent to the cavernous segment. There is occlusion of the supraclinoid internal carotid artery on the left. Vertebral arteries: Normal Skeleton: Normal.  Periapical cyst around right upper incisor Other neck: Negative for mass or adenopathy. Upper chest: Negative Review of the MIP images confirms the above findings CTA HEAD FINDINGS Anterior circulation: Slow flow with small caliber of the left cavernous carotid which then occludes in the supraclinoid component. Occlusion of the proximal left A1. Occlusion of left M1 segment. Occlusion of left M2 branches diffusely. Right middle cerebral artery widely patent. A2 segments patent bilaterally. Posterior circulation: Both vertebral arteries patent to the basilar. Left vertebral dominant. PICA patent bilaterally. Basilar patent. Superior cerebellar and posterior cerebral arteries patent bilaterally. Venous sinuses: Negative Anatomic variants: None Delayed phase: Not performed Review of the MIP images confirms the above findings CT Brain Perfusion Findings: CBF (<30%) Volume: 156mL Perfusion (Tmax>6.0s) volume: 130mL Mismatch Volume: 17mL Infarction Location:Left MCA territory including left basal ganglia. IMPRESSION: Large territory left MCA acute infarct. Acute dissection left internal carotid artery with intraluminal thrombus and slow flow in the  left internal carotid artery which occludes in the supraclinoid segment. Occlusion left A1 with reconstitution left A2. Occlusion left M1 and M2 segments. Call is currently in for Dr. Leonides Schanz. Electronically Signed   By: Franchot Gallo M.D.   On: 02/19/2018 12:59   Ct Head Code Stroke Wo Contrast  Result Date: 02/19/2018 CLINICAL DATA:  Code stroke.  Right-sided weakness, aphasia EXAM: CT HEAD WITHOUT CONTRAST TECHNIQUE: Contiguous axial images were obtained from the base of the skull through the vertex without intravenous contrast. COMPARISON:  None. FINDINGS: Brain: Hypodensity left occipital parietal cortex and white matter compatible with acute infarct. Patchy hypodensity in the left frontal lobe also consistent with infarct which is probably acute. Negative for hemorrhage.  Ventricle size normal.  No mass lesion. Vascular: Negative for hyperdense vessel Skull: Negative Sinuses/Orbits: Mild mucosal edema paranasal sinuses.  Normal orbit Other: None ASPECTS ([place] Stroke Program Early CT Score) - Ganglionic level infarction (caudate, lentiform nuclei, internal capsule, insula, M1-M3 cortex): 7 - Supraganglionic infarction (M4-M6 cortex): 2 Total score (0-10 with 10 being normal): 8 IMPRESSION: 1. Hypodensities left frontal lobe and left occipital parietal lobe most compatible with acute infarct. Possible emboli or watershed infarct. Negative for hemorrhage or mass-effect 2. ASPECTS is 8 3. These results were called by telephone at the time of interpretation on 02/19/2018 at 12:29 pm to Dr. Leonel Ramsay , who verbally acknowledged these results. Electronically Signed   By: Franchot Gallo M.D.   On: 02/19/2018 12:31     STUDIES:    CULTURES: None   ANTIBIOTICS: None   SIGNIFICANT EVENTS: Thrombectomy and hemorrhagic transformation 7/13  LINES/TUBES: ETT 7/13 >>  DISCUSSION: 37 yr old male coming in with aphasia and  right sided weakness with large left MCA infarct due to M1 occlusion and  spontaneous left ICA dissection not a candidate for TPA due to coming beyond the window was taken for emergent thrombectomy shortly after he had a ICH transformation   ASSESSMENT / PLAN:  PULMONARY A: Acute respiratory insufficiency requiring mechanical ventilation for airway protection   P:   VAP protocol - not candidate for weaning - adjust PEEP and FIO2 to keep Pox>92%  CARDIOVASCULAR A:  Hypotension post procedure  P:  - Required neosynephrine transiently - keep SBP 120-140 - use cardene if needed   RENAL A:   No acute renal issues  P:   - follow urine output and renal function  - NS at 75ml/hr    GASTROINTESTINAL A:   No issues   P:   - PPI for GI prophylaxis - keep NPO for now   HEMATOLOGIC A:   Leukocytosis  P:  - ?possible reactive no signs of infections   INFECTIOUS A:   No signs of infections  P:   No ABx for now   ENDOCRINE A:   No issues     P:   Frequent glucose monitoring    NEUROLOGIC A:   Acute left MCA stroke with left ICA spontaneous disection and M1 occlusion s/p thrombectomy with reperfusion hemorrhagic transformation   P:   RASS goal: -2 - daily sedation vacation - frequent monitoring - assess for any signs of increased ICP ?need for craniotomy - keep SBP 120-140  - neuro is primary - we appreciate the consult we will continue following    Patient is acutely ill with very high risk of morbidity and mortality with guarded prognosis.    I have spent 40 minutes of critical care time this time was spent at bedside or in the unit this time was exclusive of any billable procedures patient is needing intensive care unit due to acute respiratory insufficiency requiring mechanical ventilation acute stroke with hemorrhagic transformation requiring frequent neuro monitoring and close observation of vital signs.  FAMILY  - Updates: no family at bedside   - Inter-disciplinary family meet or Palliative Care meeting due by:   02/26/2018     Silver Huguenin  Pulmonary and [place] Pager: [phone]  02/19/2018, 4:29 PM

## 2018-02-19 NOTE — Transfer of Care (Signed)
Immediate Anesthesia Transfer of Care Note  Patient: Xavier Villegas  Procedure(s) Performed: IR WITH ANESTHESIA CODE STROKE (N/A )  Patient Location: ICU  Anesthesia Type:General  Level of Consciousness: sedated and Patient remains intubated per anesthesia plan  Airway & Oxygen Therapy: Patient remains intubated per anesthesia plan and Patient placed on Ventilator (see vital sign flow sheet for setting)  Post-op Assessment: Report given to RN and Post -op Vital signs reviewed and stable  Post vital signs: Reviewed and stable  Last Vitals:  Vitals Value Taken Time  BP 118/69 02/19/2018  3:51 PM  Temp    Pulse 87 02/19/2018  3:54 PM  Resp 12 02/19/2018  3:54 PM  SpO2 100 % 02/19/2018  3:54 PM  Vitals shown include unvalidated device data.  Last Pain:  Vitals:   02/19/18 1211  TempSrc: Axillary         Complications: No apparent anesthesia complications

## 2018-02-20 ENCOUNTER — Inpatient Hospital Stay (HOSPITAL_COMMUNITY): Payer: BLUE CROSS/BLUE SHIELD

## 2018-02-20 ENCOUNTER — Encounter (HOSPITAL_COMMUNITY): Payer: Self-pay | Admitting: Interventional Radiology

## 2018-02-20 DIAGNOSIS — R0689 Other abnormalities of breathing: Secondary | ICD-10-CM

## 2018-02-20 DIAGNOSIS — I63232 Cerebral infarction due to unspecified occlusion or stenosis of left carotid arteries: Secondary | ICD-10-CM

## 2018-02-20 DIAGNOSIS — I10 Essential (primary) hypertension: Secondary | ICD-10-CM

## 2018-02-20 DIAGNOSIS — I639 Cerebral infarction, unspecified: Secondary | ICD-10-CM

## 2018-02-20 DIAGNOSIS — I339 Acute and subacute endocarditis, unspecified: Secondary | ICD-10-CM

## 2018-02-20 DIAGNOSIS — G9340 Encephalopathy, unspecified: Secondary | ICD-10-CM

## 2018-02-20 LAB — BASIC METABOLIC PANEL
Anion gap: 9 (ref 5–15)
BUN: 5 mg/dL — ABNORMAL LOW (ref 6–20)
CALCIUM: 7.9 mg/dL — AB (ref 8.9–10.3)
CO2: 23 mmol/L (ref 22–32)
Chloride: 110 mmol/L (ref 98–111)
Creatinine, Ser: 0.71 mg/dL (ref 0.61–1.24)
GFR calc non Af Amer: >60 mL/min (ref >60)
GLUCOSE: 133 mg/dL — AB (ref 70–99)
Potassium: 4 mmol/L (ref 3.5–5.1)
Sodium: 142 mmol/L (ref 135–145)

## 2018-02-20 LAB — BLOOD GAS, ARTERIAL
Acid-base deficit: 2.4 mmol/L — ABNORMAL HIGH (ref 0.0–2.0)
Bicarbonate: 21.8 mmol/L (ref 20.0–28.0)
DRAWN BY: 51155
FIO2: 40
MECHVT: 580 mL
O2 Saturation: 99 %
PEEP: 5 cmH2O
PO2 ART: 170 mmHg — AB (ref 83.0–108.0)
Patient temperature: 98.6
RATE: 16 resp/min
pCO2 arterial: 37 mmHg (ref 32.0–48.0)
pH, Arterial: 7.387 (ref 7.350–7.450)

## 2018-02-20 LAB — LIPID PANEL
CHOL/HDL RATIO: 3 ratio
Cholesterol: 101 mg/dL (ref 0–200)
HDL: 34 mg/dL — AB (ref >40)
LDL CALC: 46 mg/dL (ref 0–99)
Triglycerides: 103 mg/dL (ref <150)
VLDL: 21 mg/dL (ref 0–40)

## 2018-02-20 LAB — CBC WITH DIFFERENTIAL/PLATELET
Abs Immature Granulocytes: 0.1 10*3/uL (ref 0.0–0.1)
BASOS PCT: 0 %
Basophils Absolute: 0 10*3/uL (ref 0.0–0.1)
EOS ABS: 0 10*3/uL (ref 0.0–0.7)
EOS PCT: 0 %
HEMATOCRIT: 43 % (ref 39.0–52.0)
Hemoglobin: 15.2 g/dL (ref 13.0–17.0)
Immature Granulocytes: 1 %
LYMPHS ABS: 1 10*3/uL (ref 0.7–4.0)
Lymphocytes Relative: 5 %
MCH: 38.6 pg — ABNORMAL HIGH (ref 26.0–34.0)
MCHC: 35.3 g/dL (ref 30.0–36.0)
MCV: 109.1 fL — AB (ref 78.0–100.0)
Monocytes Absolute: 0.9 10*3/uL (ref 0.1–1.0)
Monocytes Relative: 5 %
Neutro Abs: 17.4 10*3/uL — ABNORMAL HIGH (ref 1.7–7.7)
Neutrophils Relative %: 89 %
Platelets: 174 10*3/uL (ref 150–400)
RBC: 3.94 MIL/uL — AB (ref 4.22–5.81)
RDW: 13.2 % (ref 11.5–15.5)
WBC: 19.5 10*3/uL — AB (ref 4.0–10.5)

## 2018-02-20 LAB — SODIUM
SODIUM: 142 mmol/L (ref 135–145)
Sodium: 140 mmol/L (ref 135–145)

## 2018-02-20 LAB — HIV ANTIBODY (ROUTINE TESTING W REFLEX): HIV SCREEN 4TH GENERATION: NONREACTIVE

## 2018-02-20 LAB — MAGNESIUM
Magnesium: 1.7 mg/dL (ref 1.7–2.4)
Magnesium: 1.7 mg/dL (ref 1.7–2.4)

## 2018-02-20 LAB — TSH: TSH: 1 u[IU]/mL (ref 0.350–4.500)

## 2018-02-20 LAB — ANTITHROMBIN III: AntiThromb III Func: 96 % (ref 75–120)

## 2018-02-20 LAB — HEMOGLOBIN A1C
Hgb A1c MFr Bld: 4.9 % (ref 4.8–5.6)
MEAN PLASMA GLUCOSE: 93.93 mg/dL

## 2018-02-20 LAB — ECHOCARDIOGRAM COMPLETE
Height: 72 in
WEIGHTICAEL: 2927.71 [oz_av]

## 2018-02-20 LAB — PHOSPHORUS
PHOSPHORUS: 3.3 mg/dL (ref 2.5–4.6)
Phosphorus: 3.3 mg/dL (ref 2.5–4.6)

## 2018-02-20 LAB — SEDIMENTATION RATE: Sed Rate: 1 mm/hr (ref 0–16)

## 2018-02-20 LAB — C-REACTIVE PROTEIN

## 2018-02-20 LAB — GLUCOSE, CAPILLARY
GLUCOSE-CAPILLARY: 75 mg/dL (ref 70–99)
Glucose-Capillary: 75 mg/dL (ref 70–99)
Glucose-Capillary: 77 mg/dL (ref 70–99)

## 2018-02-20 MED ORDER — FOLIC ACID 5 MG/ML IJ SOLN
1.0000 mg | Freq: Every day | INTRAMUSCULAR | Status: AC
  Administered 2018-02-20: 1 mg via INTRAVENOUS
  Filled 2018-02-20: qty 0.2

## 2018-02-20 MED ORDER — VITAL HIGH PROTEIN PO LIQD
1000.0000 mL | ORAL | Status: DC
  Administered 2018-02-20 – 2018-02-21 (×2): 1000 mL

## 2018-02-20 MED ORDER — VITAMIN B-12 1000 MCG PO TABS
1000.0000 ug | ORAL_TABLET | Freq: Every day | ORAL | Status: DC
  Administered 2018-02-24 – 2018-03-01 (×6): 1000 ug via ORAL
  Filled 2018-02-20 (×9): qty 1

## 2018-02-20 MED ORDER — ASPIRIN 300 MG RE SUPP: 300.0000 mg | Freq: Every day | RECTAL | Status: DC

## 2018-02-20 MED ORDER — MIDAZOLAM HCL 2 MG/2ML IJ SOLN
INTRAMUSCULAR | Status: AC
Start: 2018-02-20 — End: 2018-02-20
  Administered 2018-02-20: 2 mg
  Filled 2018-02-20: qty 2

## 2018-02-20 MED ORDER — ASPIRIN 325 MG PO TABS
325.0000 mg | ORAL_TABLET | Freq: Every day | ORAL | Status: DC
  Administered 2018-02-20 – 2018-03-01 (×9): 325 mg via ORAL
  Filled 2018-02-20 (×9): qty 1

## 2018-02-20 MED ORDER — SODIUM CHLORIDE 3 % IV SOLN
INTRAVENOUS | Status: DC
  Administered 2018-02-20 – 2018-02-21 (×3): 75 mL/h via INTRAVENOUS
  Administered 2018-02-21 – 2018-02-23 (×4): 50 mL/h via INTRAVENOUS
  Filled 2018-02-20 (×18): qty 500

## 2018-02-20 MED ORDER — MIDAZOLAM HCL 2 MG/2ML IJ SOLN
INTRAMUSCULAR | Status: AC
  Administered 2018-02-20: 2 mg
  Filled 2018-02-20: qty 2

## 2018-02-20 MED ORDER — CYANOCOBALAMIN 1000 MCG/ML IJ SOLN
1000.0000 ug | Freq: Once | INTRAMUSCULAR | Status: AC
  Administered 2018-02-20: 1000 ug via INTRAMUSCULAR
  Filled 2018-02-20: qty 1

## 2018-02-20 MED ORDER — HEPARIN SODIUM (PORCINE) 5000 UNIT/ML IJ SOLN
5000.0000 [IU] | Freq: Three times a day (TID) | INTRAMUSCULAR | Status: DC
  Administered 2018-02-20 – 2018-02-21 (×2): 5000 [IU] via SUBCUTANEOUS
  Filled 2018-02-20 (×2): qty 1

## 2018-02-20 MED ORDER — FOLIC ACID 1 MG PO TABS
1.0000 mg | ORAL_TABLET | Freq: Every day | ORAL | Status: DC
  Filled 2018-02-20: qty 1

## 2018-02-20 MED ORDER — PRO-STAT SUGAR FREE PO LIQD
30.0000 mL | Freq: Two times a day (BID) | ORAL | Status: DC
  Administered 2018-02-20 – 2018-02-21 (×3): 30 mL
  Filled 2018-02-20 (×3): qty 30

## 2018-02-20 NOTE — Progress Notes (Signed)
SLP Cancellation Note  Patient Details Name: Xavier Villegas MRN: [id number] DOB: [birth year]   Cancelled treatment:       Reason Eval/Treat Not Completed: Patient not medically ready. Remains ventilated. Will follow for readiness.    Zariya Minner, Katherene Ponto 02/20/2018, 8:51 AM

## 2018-02-20 NOTE — Progress Notes (Signed)
PT Cancellation Note  Patient Details Name: [name] MRN: [id number] DOB: 06-02-1981   Cancelled Treatment:    Reason Eval/Treat Not Completed: Medical issues which prohibited therapy   Remains on Vent post mechanical thrombectomy of Left M1/MCA occlusion;   Will continue to follow for appropriateness of PT eval as schedule allows;   Roney [place], [place]  [facility] Services Pager [phone] Office [place] 02/20/2018, 7:34 AM

## 2018-02-20 NOTE — Progress Notes (Signed)
OT Cancellation Note  Patient Details Name: Chiron Campione MRN: [id number] DOB: 1981/05/14   Cancelled Treatment:    Reason Eval/Treat Not Completed: Patient not medically ready.  Remains ventilated Will reattempt.  [place], OTR/L [phone]    Lucille Passy M 02/20/2018, 9:43 AM

## 2018-02-20 NOTE — Procedures (Signed)
Central Venous Catheter Insertion Procedure Note Xavier Villegas [id number] 22-Jun-1981  Procedure: Insertion of Central Venous Catheter Indications: Drug and/or fluid administration  Procedure Details Consent: Risks of procedure as well as the alternatives and risks of each were explained to the (patient/caregiver).  Consent for procedure obtained. Time Out: Verified patient identification, verified procedure, site/side was marked, verified correct patient position, special equipment/implants available, medications/allergies/relevent history reviewed, required imaging and test results available.  Performed  Maximum sterile technique was used including antiseptics, cap, gloves, gown, hand hygiene, mask and sheet. Skin prep: Chlorhexidine; local anesthetic administered A antimicrobial bonded/coated triple lumen catheter was placed in the left internal jugular vein using the Seldinger technique.  Evaluation Blood flow good Complications: No apparent complications Patient did tolerate procedure well. Chest X-ray ordered to verify placement.  CXR: pending.  Wael Z Aljishi 02/20/2018, 4:19 PM

## 2018-02-20 NOTE — Progress Notes (Signed)
VASCULAR LAB PRELIMINARY  PRELIMINARY  PRELIMINARY  PRELIMINARY  Carotid duplex completed.    Preliminary report:  R: no ICA stenosis. The left ICA appears occluded.  Right vertebral artery flow is antegrade.  Left vertebral artery not insonated secondary to patient's positioning on vent.   Aamina Skiff, RVT 02/20/2018, 12:19 PM

## 2018-02-20 NOTE — Progress Notes (Signed)
STROKE TEAM PROGRESS NOTE   SUBJECTIVE (INTERVAL HISTORY) His mom and dad and girlfriend are at the bedside.  Pt still intubated on sedation. With sedation off, he was still obtunded, not following commands, moving left arm and leg spontaneously, but right side hemiplegia. MRI showed large left MCA infarct. MRA showed left ICA and MCA occlusion.    OBJECTIVE Temp:  [97.9 F (36.6 C)-98.2 F (36.8 C)] 98 F (36.7 C) (07/14 0400) Pulse Rate:  [58-115] 64 (07/14 0700) Cardiac Rhythm: Normal sinus rhythm (07/14 0400) Resp:  [11-22] 16 (07/14 0700) BP: (111-171)/(60-112) 121/77 (07/14 0700) SpO2:  [98 %-100 %] 99 % (07/14 0700) Arterial Line BP: (115-201)/(58-108) 130/62 (07/14 0700) FiO2 (%):  [40 %-100 %] 40 % (07/14 0259) Weight:  [182 lb 15.7 oz (83 kg)] 182 lb 15.7 oz (83 kg) (07/13 1600)  CBC:  Recent Labs  Lab 02/19/18 1215 02/19/18 1219 02/20/18 0547  WBC 15.4*  --  19.5*  NEUTROABS 11.8*  --  17.4*  HGB 18.0* 18.0* 15.2  HCT 48.7 53.0* 43.0  MCV 105.6*  --  109.1*  PLT 265  --  496    Basic Metabolic Panel:  Recent Labs  Lab 02/19/18 1215 02/19/18 1219 02/20/18 0547  NA 140 139 142  K 3.8 3.5 4.0  CL 105 101 110  CO2 22  --  23  GLUCOSE 141* 137* 133*  BUN <5* <3* 5*  CREATININE 0.73 0.80 0.71  CALCIUM 8.7*  --  7.9*    Lipid Panel:     Component Value Date/Time   CHOL 101 02/20/2018 0547   TRIG 103 02/20/2018 0547   HDL 34 (L) 02/20/2018 0547   CHOLHDL 3.0 02/20/2018 0547   VLDL 21 02/20/2018 0547   LDLCALC 46 02/20/2018 0547   HgbA1c:  Lab Results  Component Value Date   HGBA1C 4.9 02/20/2018   Urine Drug Screen:     Component Value Date/Time   LABOPIA NONE DETECTED 02/19/2018 1655   COCAINSCRNUR NONE DETECTED 02/19/2018 1655   LABBENZ NONE DETECTED 02/19/2018 1655   AMPHETMU NONE DETECTED 02/19/2018 1655   THCU NONE DETECTED 02/19/2018 1655   LABBARB (A) 02/19/2018 1655    Result not available. Reagent lot number recalled by  manufacturer.    Alcohol Level No results found for: [facility]  IMAGING I have personally reviewed the radiological images below and agree with the radiology interpretations.  Ct Angio Head W Or Wo Contrast Ct Angio Neck W Or Wo Contrast Ct Cerebral Perfusion W Contrast 02/19/2018 IMPRESSION:  Large territory left MCA acute infarct. 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 Occlusion left A1 with reconstitution left A2. Occlusion left M1 and M2 segments.   IR Angiogram Dr Earleen Newport 02/19/2018 3:08 PM Findings:  Left proximal cervical ICA dissection/thrombus Left M1 occlusion First pass, TICI 2a, with emboli in distal M1/M2 Second pass, TICI 2a, with persistent occlusion of M2 branch Third pass, TICI 2b Occlusive left ICA dissection, with patent ACA and maintained cross-flow from right to left Posterior circulation perfuses the left temporal and occipital region Flat panel CT shows SAH/ICH Complications: ICH  Ct Head Wo Contrast 02/19/2018 IMPRESSION:  Left MCA territory infarction with early low-density and swelling but no mass effect or shift at this time. I think most the hyperdensity seen previously relates to contrast staining, much of which is diminishing or resolved. There is some persistent contrast staining  in the region of the insula and I could not exclude that there is a small amount of admixed hemorrhage.   Ct Head Wo Contrast 02/19/2018 IMPRESSION:  Large territory acute infarct left MCA territory. Interval development of multiple areas of patchy hemorrhage within the infarct following clot retrieval. No midline shift.   Ct Head Code Stroke Wo Contrast 02/19/2018 IMPRESSION:  1. Hypodensities left frontal lobe and left occipital parietal lobe most compatible with acute infarct. Possible emboli or watershed infarct. Negative for hemorrhage or mass-effect   2. ASPECTS is 8   MRI / MRA Acute infarct of the entire left MCA territory with mild associated hemorrhage in the left parietal and frontal lobe. Diffuse edema. 2 mm midline shift to the right. No other infarct Occlusion of the left internal carotid artery and entire left middle cerebral artery  Transthoracic Echocardiogram - Left ventricle: The cavity size was normal. Wall thickness was   increased in a pattern of mild LVH. Systolic function was normal.   The estimated ejection fraction was in the range of 55% to 60%.   Wall motion was normal; there were no regional wall motion   abnormalities. Left ventricular diastolic function parameters   were normal. - Aortic valve: There was a small, mobile vegetation on the left   ventricular aspect. Impressions: - There is a small - medium sized mobile mass on the LV aspect of   the aortic valve.   The appearence is c/w vegetation.   Aortic valve endocarditis  VAS [place] LOWER EXTREMITY VENOUS (DVT) There is no DVT or SVT noted in the bilateral lower extremities.   CUS  R: no ICA stenosis. The left ICA appears occluded.  Right vertebral artery flow is antegrade.  Left vertebral artery not insonated secondary to patient's positioning on vent.  CT head pending   PHYSICAL EXAM Temp:  [97.9 F (36.6 C)-100.6 F (38.1 C)] 98.9 F (37.2 C) (07/14 1600) Pulse Rate:  [58-115] 62 (07/14 1400) Resp:  [11-22] 16 (07/14 1400) BP: (111-171)/(62-112) 124/80 (07/14 1400) SpO2:  [99 %-100 %] 100 % (07/14 1400) Arterial Line BP: (115-201)/(58-108) 142/74 (07/14 1400) FiO2 (%):  [40 %-60 %] 40 % (07/14 1310)  General - Well nourished, well developed, intubated on sedation.  Ophthalmologic - fundi not visualized due to noncooperation.  Cardiovascular - Regular rate and rhythm.  Neuro - intubated on sedation. With sedation off, he was not able to open eyes on voice. With forced eye open, eye mid position, sluggish pupillary reflex, positive gag  and corneal. Not blinking to visual threat, doll's eye sluggish bilaterally. Facial symmetry not able to evaluate due to ET tube. LUE and LLE spontaneous movement against gravity, RUE and REL 1/5 on pain stimulation. DTR 1+ and right babinski positive. Sensation, coordination and gait not tested.   ASSESSMENT/PLAN Mr. Colt Martelle is a 37 y.o. male with no significant past medical history other than tobacco use presenting with right-sided weakness and aphasia. He did not receive IV t-PA due to late presentation.  Attempted thrombectomy with subsequent left ICA dissection and possible ICH.  Stroke: large left MCA territory infarct due to left ICA and MCA occlusion, etiology unclear, could be due to ICA dissection or aortic valve vegetation shown on TTE   Resultant  Intubated, right hemiplegia, aphasia  CT head - Large territory acute infarct left MCA territory.  IR - occlusive left ICA but TICI2b left MCA  MRI head - large left MCA infarct with small ICH/SAH hemorrhagic conversion  MRA head - left ICA and MCA remain occluded  CTA H&N - Acute dissection left internal carotid artery with intraluminal thrombus  LE Venous Dopplers - no DVT  Carotid Doppler - left ICA occluded  2D Echo - EF 55-60% and possible aortic valve endocarditis  Need to consider TEE once stable  Hypercoagulable work up pending  LDL - 46  HgbA1c - 4.9  VTE prophylaxis - subq heparin  No antithrombotic prior to admission, now on ASA.   Ongoing aggressive stroke risk factor management  Therapy recommendations:  pending  Disposition:  Pending  Cerebral edema  MRA large left MCA infarct with 50mm midline shift  On 3% saline  Na goal 150-160  Na Q6h  Need central line for potential 23.4% saline  CT repeat in am  Will call NSG consult if hemicrani is needed.  Aortic valve endocarditis ??  TTE concerning for AV vegetation  Blood culture pending  WBC 15.4->19.5  Tmax  100.6  Will need TEE once stable  Left ICA dissection ??  Girlfriend stated that he bumped his head to window 3 weeks ago  Left ICA occlusion   Tobacco abuse  Current smoker  Smoking cessation counseling will be provided   Other Stroke Risk Factors  ETOH use, advised to drink no more than 1 alcoholic beverage per day.  Family hx of stroke (grandparents)  Other Active Problems  Low FA level - 1.5 - on FA supplement  Low normal B12 level - 224 -supplementation ordered  Hospital day # 1  This patient is critically ill due to large left MCA infarct with hemorrhagic conversion, endocarditis, smoker and at significant risk of neurological worsening, death form recurrent stroke, hemorrhage, heart failure, seizure. This patient's care requires constant monitoring of vital signs, hemodynamics, respiratory and cardiac monitoring, review of multiple databases, neurological assessment, discussion with family, other specialists and medical decision making of high complexity. I spent 50 minutes of neurocritical care time in the care of this patient. I had long discussion with dad, mom and girlfriend at bedside, updated pt current condition, treatment plan and potential prognosis. They expressed understanding and appreciation.   Rosalin Hawking, MD PhD Stroke Neurology 02/20/2018 4:49 PM  To contact Stroke Continuity provider, please refer to [email or web address] After hours, contact General Neurology

## 2018-02-20 NOTE — Progress Notes (Signed)
  Echocardiogram 2D Echocardiogram has been performed.  Xavier Villegas 02/20/2018, 1:04 PM

## 2018-02-20 NOTE — Progress Notes (Signed)
VASCULAR LAB PRELIMINARY  PRELIMINARY  PRELIMINARY  PRELIMINARY  Bilateral lower extremity venous duplex completed.    Preliminary report:  There is no DVT or SVT noted in the bilateral lower extremities.   Rashee Marschall, RVT 02/20/2018, 12:21 PM

## 2018-02-20 NOTE — Progress Notes (Signed)
Brief Nutrition Note  Consult received for enteral/tube feeding initiation and management.  Adult Enteral Nutrition Protocol initiated. Full assessment to follow.  Admitting Dx: Stroke (cerebrum) ([place]) [I63.9]  Body mass index is 24.82 kg/m. Pt meets criteria for normal based on current BMI.  Labs:  Recent Labs  Lab 02/19/18 1215 02/19/18 1219 02/20/18 0547  NA 140 139 142  K 3.8 3.5 4.0  CL 105 101 110  CO2 22  --  23  BUN <5* <3* 5*  CREATININE 0.73 0.80 0.71  CALCIUM 8.7*  --  7.9*  GLUCOSE 141* 137* 133*    Clayton Bibles, MS, RD, LDN [facility] Long Inpatient Clinical Dietitian Pager: [phone] After Hours Pager: [phone]

## 2018-02-20 NOTE — Consult Note (Signed)
PULMONARY / CRITICAL CARE MEDICINE   Name: Xavier Villegas MRN: [id number] DOB: [birth year]    ADMISSION DATE:  02/19/2018 CONSULTATION DATE: 02/19/2018  REFERRING MD:  Rosette Reveal  CHIEF COMPLAINT:  Right sided weakness and aphasia  HISTORY OF PRESENT ILLNESS:   37 yo male otherwise healthy coming in due to multiple falls. Unfortunately patient was sedated on the ventilator and no family around and most of the histroy was taken from the chart and staff. Patient had a sudden weakness last night and had to use assistance to go to his room after he fell on the floor. This morning his girlfriend could not get him up so he was brought in as a code stroke. CT showed left MCA stroke with Left ICA dissection with proximal left M1/MCA occlusion where he was taken for thrombectomy and post procedure CT showed SAH and hemorrhagic transformation. Patient came to the ICU intubated and paralyzed.   Subjective: No events overnight, no new complaints  VITAL SIGNS: BP (!) 168/80   Pulse 75   Temp 98.4 F (36.9 C) (Axillary)   Resp 16   Ht 6' (1.829 m)   Wt 182 lb 15.7 oz (83 kg)   SpO2 100%   BMI 24.82 kg/m   HEMODYNAMICS:    VENTILATOR SETTINGS: Vent Mode: PRVC FiO2 (%):  [40 %-100 %] 40 % Set Rate:  [16 bmp] 16 bmp Vt Set:  [580 mL] 580 mL PEEP:  [5 cmH20] 5 cmH20 Plateau Pressure:  [7 cmH20-19 cmH20] 16 cmH20  INTAKE / OUTPUT: I/O last 3 completed shifts: In: 2805.8 [I.V.:2305.8; IV Piggyback:500] Out: 1900 [Urine:1890; Blood:10]  PHYSICAL EXAMINATION: General: Sedated, on the vent acutely ill appearing Neuro:  Unable to assess, heavily sedated for MRI HEENT: Melbeta/AT, ETT in place Cardiovascular: RRR, Nl S1/S2 and -M/R/G Lungs: CTA bilaterally Abdomen:  Soft, NT, ND and +BS Musculoskeletal:  No edema  Skin:  No rash   LABS:  BMET Recent Labs  Lab 02/19/18 1215 02/19/18 1219 02/20/18 0547  NA 140 139 142  K 3.8 3.5 4.0  CL 105 101 110  CO2 22  --  23  BUN <5*  <3* 5*  CREATININE 0.73 0.80 0.71  GLUCOSE 141* 137* 133*   Electrolytes Recent Labs  Lab 02/19/18 1215 02/20/18 0547  CALCIUM 8.7* 7.9*   CBC Recent Labs  Lab 02/19/18 1215 02/19/18 1219 02/20/18 0547  WBC 15.4*  --  19.5*  HGB 18.0* 18.0* 15.2  HCT 48.7 53.0* 43.0  PLT 265  --  174   Coag's Recent Labs  Lab 02/19/18 1215  APTT 29  INR 1.02   Sepsis Markers No results for input(s): LATICACIDVEN, PROCALCITON, O2SATVEN in the last 168 hours.  ABG Recent Labs  Lab 02/19/18 1850 02/20/18 0252  PHART 7.328* 7.387  PCO2ART 37.9 37.0  PO2ART 218.0* 170*   Liver Enzymes Recent Labs  Lab 02/19/18 1215  AST 28  ALT 33  ALKPHOS 96  BILITOT 1.1  ALBUMIN 3.4*   Cardiac Enzymes No results for input(s): TROPONINI, PROBNP in the last 168 hours.  Glucose No results for input(s): GLUCAP in the last 168 hours.  Imaging Ct Angio Head W Or Wo Contrast  Result Date: 02/19/2018 CLINICAL DATA:  Stroke.  Right-sided weakness aphasia EXAM: CT ANGIOGRAPHY HEAD AND NECK CT PERFUSION BRAIN TECHNIQUE: Multidetector CT imaging of the head and neck was performed using the standard protocol during bolus administration of intravenous contrast. Multiplanar CT image reconstructions and MIPs were obtained to evaluate the  vascular anatomy. Carotid stenosis measurements (when applicable) are obtained utilizing NASCET criteria, using the distal internal carotid diameter as the denominator. Multiphase CT imaging of the brain was performed following IV bolus contrast injection. Subsequent parametric perfusion maps were calculated using RAPID software. CONTRAST:  17mL ISOVUE-370 IOPAMIDOL (ISOVUE-370) INJECTION 76% COMPARISON:  CT head 02/19/2018 FINDINGS: CTA NECK FINDINGS Aortic arch: Normal aortic arch without atherosclerotic disease or aneurysm. Proximal great vessels normal Right carotid system: Normal right carotid. No evidence of atherosclerotic disease dissection or stenosis Left carotid  system: Irregularity and stenosis of the proximal left internal carotid artery compatible with dissection. No significant calcification. Intraluminal filling defect compatible with thrombus. Left internal carotid artery small in caliber but patent to the cavernous segment. There is occlusion of the supraclinoid internal carotid artery on the left. Vertebral arteries: Normal Skeleton: Normal.  Periapical cyst around right upper incisor Other neck: Negative for mass or adenopathy. Upper chest: Negative Review of the MIP images confirms the above findings CTA HEAD FINDINGS Anterior circulation: Slow flow with small caliber of the left cavernous carotid which then occludes in the supraclinoid component. Occlusion of the proximal left A1. Occlusion of left M1 segment. Occlusion of left M2 branches diffusely. Right middle cerebral artery widely patent. A2 segments patent bilaterally. Posterior circulation: Both vertebral arteries patent to the basilar. Left vertebral dominant. PICA patent bilaterally. Basilar patent. Superior cerebellar and posterior cerebral arteries patent bilaterally. Venous sinuses: Negative Anatomic variants: None Delayed phase: Not performed Review of the MIP images confirms the above findings CT Brain Perfusion Findings: CBF (<30%) Volume: 165mL Perfusion (Tmax>6.0s) volume: 128mL Mismatch Volume: 12mL Infarction Location:Left MCA territory including left basal ganglia. IMPRESSION: Large territory left MCA acute infarct. 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 Occlusion left A1 with reconstitution left A2. Occlusion left M1 and M2 segments. Call is currently in for Dr. Leonides Schanz. Electronically Signed   By: Franchot Gallo M.D.   On: 02/19/2018 12:59   Dg Chest 1 View  Result Date: 02/19/2018 CLINICAL DATA:  37 year old male with history of stroke. Post procedure chest  x-ray. EXAM: CHEST  1 VIEW COMPARISON:  No priors. FINDINGS: An endotracheal tube is in place with tip 3.1 cm above the carina. Nasogastric tube extends into the proximal stomach. Ill-defined opacity in the right lung base which is coincident with the in divisum tubing, potentially artifactual. Lung volumes are low. No other definite consolidative airspace disease. No pleural effusions. No evidence of pulmonary edema. Heart size is normal. Upper mediastinal contours are within normal limits. IMPRESSION: 1. Support apparatus, as above. 2. Low lung volumes without definite radiographic evidence of acute cardiopulmonary disease. Electronically Signed   By: Vinnie Langton M.D.   On: 02/19/2018 16:24   Ct Head Wo Contrast  Result Date: 02/19/2018 CLINICAL DATA:  Followup revascularization attempt. Left MCA territory infarction secondary to acute dissection of the left internal carotid artery. EXAM: CT HEAD WITHOUT CONTRAST TECHNIQUE: Contiguous axial images were obtained from the base of the skull through the vertex without intravenous contrast. COMPARISON:  Multiple examinations earlier today. FINDINGS: Brain: Developing left MCA territory infarction. Contrast staining possibly admixed with hemorrhage in the insular region. Diminished contrast staining elsewhere in the region of the infarction. Early swelling but no mass effect or shift at this time. No hydrocephalus. No extra-axial collection. Vascular: Some vascular contrast still evident. Skull: Normal Sinuses/Orbits: Clear/normal Other: None IMPRESSION: Left MCA  territory infarction with early low-density and swelling but no mass effect or shift at this time. I think most the hyperdensity seen previously relates to contrast staining, much of which is diminishing or resolved. There is some persistent contrast staining in the region of the insula and I could not exclude that there is a small amount of admixed hemorrhage. Electronically Signed   By: Nelson Chimes  M.D.   On: 02/19/2018 20:33   Ct Head Wo Contrast  Result Date: 02/19/2018 CLINICAL DATA:  Stroke post intervention. EXAM: CT HEAD WITHOUT CONTRAST TECHNIQUE: Contiguous axial images were obtained from the base of the skull through the vertex without intravenous contrast. COMPARISON:  CT head 02/19/2018 FINDINGS: Brain: Large territory left MCA infarct has become more evident on CT with diffuse low-density throughout the left MCA territory including the basal ganglia. Interval development of patchy areas of acute hemorrhage within the infarct involving the left frontal, left parietal, and left insular region. Chronic hemorrhage also in the left lateral basal ganglia. High density in the left posterior insula likely represents extravasation of contrast. Mild local mass-effect without midline shift. Ventricle size remains normal. No other acute infarct. Vascular: Diffuse arterial enhancement due to prior angiography in CTA Skull: Negative Sinuses/Orbits: Mild mucosal edema paranasal sinuses. Negative orbit Other: None IMPRESSION: Large territory acute infarct left MCA territory. Interval development of multiple areas of patchy hemorrhage within the infarct following clot retrieval. No midline shift. The images were reviewed with Dr. Earleen Newport. Electronically Signed   By: Franchot Gallo M.D.   On: 02/19/2018 16:18   Ct Angio Neck W Or Wo Contrast  Result Date: 02/19/2018 CLINICAL DATA:  Stroke.  Right-sided weakness aphasia EXAM: CT ANGIOGRAPHY HEAD AND NECK CT PERFUSION BRAIN TECHNIQUE: Multidetector CT imaging of the head and neck was performed using the standard protocol during bolus administration of intravenous contrast. Multiplanar CT image reconstructions and MIPs were obtained to evaluate the vascular anatomy. Carotid stenosis measurements (when applicable) are obtained utilizing NASCET criteria, using the distal internal carotid diameter as the denominator. Multiphase CT imaging of the brain was performed  following IV bolus contrast injection. Subsequent parametric perfusion maps were calculated using RAPID software. CONTRAST:  6mL ISOVUE-370 IOPAMIDOL (ISOVUE-370) INJECTION 76% COMPARISON:  CT head 02/19/2018 FINDINGS: CTA NECK FINDINGS Aortic arch: Normal aortic arch without atherosclerotic disease or aneurysm. Proximal great vessels normal Right carotid system: Normal right carotid. No evidence of atherosclerotic disease dissection or stenosis Left carotid system: Irregularity and stenosis of the proximal left internal carotid artery compatible with dissection. No significant calcification. Intraluminal filling defect compatible with thrombus. Left internal carotid artery small in caliber but patent to the cavernous segment. There is occlusion of the supraclinoid internal carotid artery on the left. Vertebral arteries: Normal Skeleton: Normal.  Periapical cyst around right upper incisor Other neck: Negative for mass or adenopathy. Upper chest: Negative Review of the MIP images confirms the above findings CTA HEAD FINDINGS Anterior circulation: Slow flow with small caliber of the left cavernous carotid which then occludes in the supraclinoid component. Occlusion of the proximal left A1. Occlusion of left M1 segment. Occlusion of left M2 branches diffusely. Right middle cerebral artery widely patent. A2 segments patent bilaterally. Posterior circulation: Both vertebral arteries patent to the basilar. Left vertebral dominant. PICA patent bilaterally. Basilar patent. Superior cerebellar and posterior cerebral arteries patent bilaterally. Venous sinuses: Negative Anatomic variants: None Delayed phase: Not performed Review of the MIP images confirms the above findings CT Brain Perfusion Findings: CBF (<30%) Volume:  158mL Perfusion (Tmax>6.0s) volume: 114mL Mismatch Volume: 54mL Infarction Location:Left MCA territory including left basal ganglia. IMPRESSION: Large territory left MCA acute infarct. Acute dissection left  internal carotid artery with intraluminal thrombus and slow flow in the left internal carotid artery which occludes in the supraclinoid segment. Occlusion left A1 with reconstitution left A2. Occlusion left M1 and M2 segments. Call is currently in for Dr. Leonides Schanz. Electronically Signed   By: Franchot Gallo M.D.   On: 02/19/2018 12:59   Ct Cerebral Perfusion W Contrast  Result Date: 02/19/2018 CLINICAL DATA:  Stroke.  Right-sided weakness aphasia EXAM: CT ANGIOGRAPHY HEAD AND NECK CT PERFUSION BRAIN TECHNIQUE: Multidetector CT imaging of the head and neck was performed using the standard protocol during bolus administration of intravenous contrast. Multiplanar CT image reconstructions and MIPs were obtained to evaluate the vascular anatomy. Carotid stenosis measurements (when applicable) are obtained utilizing NASCET criteria, using the distal internal carotid diameter as the denominator. Multiphase CT imaging of the brain was performed following IV bolus contrast injection. Subsequent parametric perfusion maps were calculated using RAPID software. CONTRAST:  47mL ISOVUE-370 IOPAMIDOL (ISOVUE-370) INJECTION 76% COMPARISON:  CT head 02/19/2018 FINDINGS: CTA NECK FINDINGS Aortic arch: Normal aortic arch without atherosclerotic disease or aneurysm. Proximal great vessels normal Right carotid system: Normal right carotid. No evidence of atherosclerotic disease dissection or stenosis Left carotid system: Irregularity and stenosis of the proximal left internal carotid artery compatible with dissection. No significant calcification. Intraluminal filling defect compatible with thrombus. Left internal carotid artery small in caliber but patent to the cavernous segment. There is occlusion of the supraclinoid internal carotid artery on the left. Vertebral arteries: Normal Skeleton: Normal.  Periapical cyst around right upper incisor Other neck: Negative for mass or adenopathy. Upper chest: Negative Review of the MIP images  confirms the above findings CTA HEAD FINDINGS Anterior circulation: Slow flow with small caliber of the left cavernous carotid which then occludes in the supraclinoid component. Occlusion of the proximal left A1. Occlusion of left M1 segment. Occlusion of left M2 branches diffusely. Right middle cerebral artery widely patent. A2 segments patent bilaterally. Posterior circulation: Both vertebral arteries patent to the basilar. Left vertebral dominant. PICA patent bilaterally. Basilar patent. Superior cerebellar and posterior cerebral arteries patent bilaterally. Venous sinuses: Negative Anatomic variants: None Delayed phase: Not performed Review of the MIP images confirms the above findings CT Brain Perfusion Findings: CBF (<30%) Volume: 123mL Perfusion (Tmax>6.0s) volume: 136mL Mismatch Volume: 7mL Infarction Location:Left MCA territory including left basal ganglia. IMPRESSION: Large territory left MCA acute infarct. 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 Occlusion left A1 with reconstitution left A2. Occlusion left M1 and M2 segments. Call is currently in for Dr. Leonides Schanz. Electronically Signed   By: Franchot Gallo M.D.   On: 02/19/2018 12:59   Dg Chest Port 1 View  Result Date: 02/20/2018 CLINICAL DATA:  37 year old male with respiratory failure status post left internal carotid artery dissection and left MCA territory infarct. EXAM: PORTABLE CHEST 1 VIEW COMPARISON:  Prior chest x-ray 02/19/2018 FINDINGS: The patient is intubated. The tip of the endotracheal tube is 6.8 cm above the carina. A nasogastric tube is present. The tip of the tube overlies the GE junction and is not within the stomach. Low inspiratory volumes with perhaps minimal bibasilar atelectasis. The lungs are otherwise clear. Remote healed right seventh rib fracture. IMPRESSION: 1. The tip of the nasogastric tube  overlies the gastroesophageal junction and is not within the stomach. If the intends to decompress the stomach, recommend advancing 10 cm. 2. Low inspiratory volumes with minimal bibasilar atelectasis. 3. The tip of the endotracheal tube is 6.7 cm above the carina. These results will be called to the ordering clinician or representative by the Radiologist Assistant, and communication documented in the PACS or zVision Dashboard. Electronically Signed   By: Jacqulynn Cadet M.D.   On: 02/20/2018 08:55   Ct Head Code Stroke Wo Contrast  Result Date: 02/19/2018 CLINICAL DATA:  Code stroke.  Right-sided weakness, aphasia EXAM: CT HEAD WITHOUT CONTRAST TECHNIQUE: Contiguous axial images were obtained from the base of the skull through the vertex without intravenous contrast. COMPARISON:  None. FINDINGS: Brain: Hypodensity left occipital parietal cortex and white matter compatible with acute infarct. Patchy hypodensity in the left frontal lobe also consistent with infarct which is probably acute. Negative for hemorrhage.  Ventricle size normal.  No mass lesion. Vascular: Negative for hyperdense vessel Skull: Negative Sinuses/Orbits: Mild mucosal edema paranasal sinuses.  Normal orbit Other: None ASPECTS (Nikolai Stroke Program Early CT Score) - Ganglionic level infarction (caudate, lentiform nuclei, internal capsule, insula, M1-M3 cortex): 7 - Supraganglionic infarction (M4-M6 cortex): 2 Total score (0-10 with 10 being normal): 8 IMPRESSION: 1. Hypodensities left frontal lobe and left occipital parietal lobe most compatible with acute infarct. Possible emboli or watershed infarct. Negative for hemorrhage or mass-effect 2. ASPECTS is 8 3. These results were called by telephone at the time of interpretation on 02/19/2018 at 12:29 pm to Dr. Leonel Ramsay , who verbally acknowledged these results. Electronically Signed   By: Franchot Gallo M.D.   On: 02/19/2018 12:31   STUDIES:    CULTURES: None   ANTIBIOTICS: None    SIGNIFICANT EVENTS: Thrombectomy and hemorrhagic transformation 7/13  LINES/TUBES: ETT 7/13 >>  DISCUSSION: 37 yr old male coming in with aphasia and right sided weakness with large left MCA infarct due to M1 occlusion and spontaneous left ICA dissection not a candidate for TPA due to coming beyond the window was taken for emergent thrombectomy shortly after he had a ICH transformation   ASSESSMENT / PLAN:  PULMONARY A: Acute respiratory insufficiency requiring mechanical ventilation for airway protection   P:   - VAP protocol - Continue full vent support - Adjust vent for ABG - CXR and ABG in AM - Will need to assess airway protection prior to serious consideration of extubation  CARDIOVASCULAR A:  Hypotension post procedure  P:  - Required neosynephrine transiently - Cardene or Neo for target SBP of 120-140 - Tele monitoring  RENAL A:   No acute renal issues  P:   - Follow urine output and renal function  - NS at 69ml/hr  - BMET in AM - Replace electrolytes as indicated  GASTROINTESTINAL A:   No issues   P:   - PPI for GI prophylaxis - Consult nutrition for TF as per nutrition  HEMATOLOGIC A:   Leukocytosis  P:  - ?possible reactive no signs of infections   INFECTIOUS A:   No signs of infections  P:   - Monitor off abx  ENDOCRINE A:   No issues     P:   - Frequent glucose monitoring   NEUROLOGIC A:   Acute left MCA stroke with left ICA spontaneous disection and M1 occlusion s/p thrombectomy with reperfusion hemorrhagic transformation   P:   RASS goal: -2 - Daily sedation vacation - Frequent monitoring - Assess  for any signs of increased ICP ?need for craniotomy - Keep SBP 120-140  - Neuro is primary   FAMILY  - Updates: no family at bedside   - Inter-disciplinary family meet or Palliative Care meeting due by:  02/26/2018   The patient is critically ill with multiple organ systems failure and requires high complexity decision  making for assessment and support, frequent evaluation and titration of therapies, application of advanced monitoring technologies and extensive interpretation of multiple databases.   Critical Care Time devoted to patient care services described in this note is  34  Minutes. This time reflects time of care of this signee Dr Jennet Maduro. This critical care time does not reflect procedure time, or teaching time or supervisory time of PA/NP/Med student/Med Resident etc but could involve care discussion time.  Rush Farmer, M.D. [facility] Pulmonary/Critical Care Medicine. Pager: [phone]. After hours pager: [phone].   02/20/2018, 10:25 AM

## 2018-02-21 ENCOUNTER — Encounter (HOSPITAL_COMMUNITY): Payer: Self-pay

## 2018-02-21 ENCOUNTER — Inpatient Hospital Stay (HOSPITAL_COMMUNITY)
Admission: EM | Disposition: A | Discharge status: Rehabilitation Facility | Payer: Self-pay | Source: Home / Self Care | Attending: Neurology

## 2018-02-21 ENCOUNTER — Inpatient Hospital Stay (HOSPITAL_COMMUNITY): Payer: BLUE CROSS/BLUE SHIELD | Admitting: Certified Registered"

## 2018-02-21 ENCOUNTER — Inpatient Hospital Stay (HOSPITAL_COMMUNITY): Payer: BLUE CROSS/BLUE SHIELD

## 2018-02-21 DIAGNOSIS — R0689 Other abnormalities of breathing: Secondary | ICD-10-CM | POA: Diagnosis not present

## 2018-02-21 DIAGNOSIS — I6522 Occlusion and stenosis of left carotid artery: Secondary | ICD-10-CM

## 2018-02-21 DIAGNOSIS — G935 Compression of brain: Secondary | ICD-10-CM

## 2018-02-21 DIAGNOSIS — I6389 Other cerebral infarction: Secondary | ICD-10-CM | POA: Diagnosis not present

## 2018-02-21 DIAGNOSIS — J9601 Acute respiratory failure with hypoxia: Secondary | ICD-10-CM

## 2018-02-21 HISTORY — PX: CRANIOTOMY: SHX93

## 2018-02-21 LAB — POCT I-STAT 3, ART BLOOD GAS (G3+)
Acid-base deficit: 2 mmol/L (ref 0.0–2.0)
Bicarbonate: 24.8 mmol/L (ref 20.0–28.0)
Bicarbonate: 23 mmol/L (ref 20.0–28.0)
O2 SAT: 100 %
O2 SAT: 99 %
PCO2 ART: 39.7 mmHg (ref 32.0–48.0)
PCO2 ART: 39.4 mmHg (ref 32.0–48.0)
PH ART: 7.373 (ref 7.350–7.450)
PO2 ART: 169 mmHg — AB (ref 83.0–108.0)
Patient temperature: 98.2
TCO2: 26 mmol/L (ref 22–32)
TCO2: 24 mmol/L (ref 22–32)
pH, Arterial: 7.404 (ref 7.350–7.450)
pO2, Arterial: 172 mmHg — ABNORMAL HIGH (ref 83.0–108.0)

## 2018-02-21 LAB — LUPUS ANTICOAGULANT PANEL
DRVVT: 32.1 s (ref 0.0–47.0)
PTT Lupus Anticoagulant: 32.7 s (ref 0.0–51.9)

## 2018-02-21 LAB — CBC
HCT: 44.2 % (ref 39.0–52.0)
Hemoglobin: 15.1 g/dL (ref 13.0–17.0)
MCH: 38.4 pg — AB (ref 26.0–34.0)
MCHC: 34.2 g/dL (ref 30.0–36.0)
MCV: 112.5 fL — ABNORMAL HIGH (ref 78.0–100.0)
Platelets: 157 10*3/uL (ref 150–400)
RBC: 3.93 MIL/uL — ABNORMAL LOW (ref 4.22–5.81)
RDW: 13.5 % (ref 11.5–15.5)
WBC: 14.2 10*3/uL — ABNORMAL HIGH (ref 4.0–10.5)

## 2018-02-21 LAB — PROTEIN C ACTIVITY: PROTEIN C ACTIVITY: 94 % (ref 73–180)

## 2018-02-21 LAB — BLOOD GAS, ARTERIAL
ACID-BASE DEFICIT: 0.1 mmol/L (ref 0.0–2.0)
Bicarbonate: 24.1 mmol/L (ref 20.0–28.0)
DRAWN BY: 252031
FIO2: 40
LHR: 16 {breaths}/min
O2 Saturation: 99.2 %
PEEP: 5 cmH2O
Patient temperature: 98.6
VT: 580 mL
pCO2 arterial: 39.5 mmHg (ref 32.0–48.0)
pH, Arterial: 7.401 (ref 7.350–7.450)
pO2, Arterial: 156 mmHg — ABNORMAL HIGH (ref 83.0–108.0)

## 2018-02-21 LAB — BASIC METABOLIC PANEL
Anion gap: 7 (ref 5–15)
BUN: 12 mg/dL (ref 6–20)
CALCIUM: 8 mg/dL — AB (ref 8.9–10.3)
CO2: 25 mmol/L (ref 22–32)
CREATININE: 0.7 mg/dL (ref 0.61–1.24)
Chloride: 118 mmol/L — ABNORMAL HIGH (ref 98–111)
GFR calc non Af Amer: >60 mL/min (ref >60)
Glucose, Bld: 120 mg/dL — ABNORMAL HIGH (ref 70–99)
Potassium: 3.4 mmol/L — ABNORMAL LOW (ref 3.5–5.1)
SODIUM: 150 mmol/L — AB (ref 135–145)

## 2018-02-21 LAB — GLUCOSE, CAPILLARY
GLUCOSE-CAPILLARY: 82 mg/dL (ref 70–99)
Glucose-Capillary: 107 mg/dL — ABNORMAL HIGH (ref 70–99)
Glucose-Capillary: 146 mg/dL — ABNORMAL HIGH (ref 70–99)
Glucose-Capillary: 81 mg/dL (ref 70–99)
Glucose-Capillary: 85 mg/dL (ref 70–99)
Glucose-Capillary: 89 mg/dL (ref 70–99)

## 2018-02-21 LAB — HOMOCYSTEINE: Homocysteine: 181.7 umol/L — ABNORMAL HIGH (ref 0.0–15.0)

## 2018-02-21 LAB — PROTEIN S, TOTAL: Protein S Ag, Total: 75 % (ref 60–150)

## 2018-02-21 LAB — PROTEIN S ACTIVITY: PROTEIN S ACTIVITY: 70 % (ref 63–140)

## 2018-02-21 LAB — PHOSPHORUS
PHOSPHORUS: 2.6 mg/dL (ref 2.5–4.6)
PHOSPHORUS: 2.9 mg/dL (ref 2.5–4.6)

## 2018-02-21 LAB — SODIUM
SODIUM: 145 mmol/L (ref 135–145)
SODIUM: 153 mmol/L — AB (ref 135–145)
Sodium: 157 mmol/L — ABNORMAL HIGH (ref 135–145)

## 2018-02-21 LAB — ANTI-DNA ANTIBODY, DOUBLE-STRANDED: DS DNA AB: 2 [IU]/mL (ref 0–9)

## 2018-02-21 LAB — ANTINUCLEAR ANTIBODIES, IFA: ANTINUCLEAR ANTIBODIES, IFA: NEGATIVE

## 2018-02-21 LAB — RPR: RPR: NONREACTIVE

## 2018-02-21 LAB — MAGNESIUM
MAGNESIUM: 1.8 mg/dL (ref 1.7–2.4)
Magnesium: 1.8 mg/dL (ref 1.7–2.4)

## 2018-02-21 SURGERY — CRANIOTOMY HEMATOMA EVACUATION SUBDURAL
Anesthesia: General | Laterality: Left

## 2018-02-21 MED ORDER — CEFAZOLIN SODIUM-DEXTROSE 2-4 GM/100ML-% IV SOLN
2.0000 g | Freq: Three times a day (TID) | INTRAVENOUS | Status: AC
  Administered 2018-02-22 (×2): 2 g via INTRAVENOUS
  Filled 2018-02-21 (×2): qty 100

## 2018-02-21 MED ORDER — CEFAZOLIN SODIUM-DEXTROSE 1-4 GM/50ML-% IV SOLN
INTRAVENOUS | Status: DC | PRN
  Administered 2018-02-21: 2 g via INTRAVENOUS

## 2018-02-21 MED ORDER — SODIUM CHLORIDE 0.9 % IJ SOLN
INTRAMUSCULAR | Status: AC
  Filled 2018-02-21: qty 20

## 2018-02-21 MED ORDER — SUCCINYLCHOLINE CHLORIDE 200 MG/10ML IV SOSY
PREFILLED_SYRINGE | INTRAVENOUS | Status: AC
  Filled 2018-02-21: qty 10

## 2018-02-21 MED ORDER — SODIUM CHLORIDE 23.4 % INJECTION (4 MEQ/ML) FOR IV ADMINISTRATION
120.0000 meq | Freq: Once | INTRAVENOUS | Status: DC
  Filled 2018-02-21: qty 30

## 2018-02-21 MED ORDER — ROCURONIUM BROMIDE 100 MG/10ML IV SOLN
INTRAVENOUS | Status: DC | PRN
  Administered 2018-02-21: 50 mg via INTRAVENOUS

## 2018-02-21 MED ORDER — PHENYLEPHRINE 40 MCG/ML (10ML) SYRINGE FOR IV PUSH (FOR BLOOD PRESSURE SUPPORT)
PREFILLED_SYRINGE | INTRAVENOUS | Status: AC
  Filled 2018-02-21: qty 20

## 2018-02-21 MED ORDER — CEFAZOLIN SODIUM 1 G IJ SOLR
INTRAMUSCULAR | Status: AC
  Filled 2018-02-21: qty 20

## 2018-02-21 MED ORDER — FAMOTIDINE IN NACL 20-0.9 MG/50ML-% IV SOLN
20.0000 mg | Freq: Two times a day (BID) | INTRAVENOUS | Status: DC
  Administered 2018-02-21 – 2018-02-23 (×4): 20 mg via INTRAVENOUS
  Filled 2018-02-21 (×4): qty 50

## 2018-02-21 MED ORDER — FENTANYL CITRATE (PF) 250 MCG/5ML IJ SOLN
INTRAMUSCULAR | Status: DC | PRN
  Administered 2018-02-21 (×3): 50 ug via INTRAVENOUS
  Administered 2018-02-21: 100 ug via INTRAVENOUS

## 2018-02-21 MED ORDER — 0.9 % SODIUM CHLORIDE (POUR BTL) OPTIME
TOPICAL | Status: DC | PRN
  Administered 2018-02-21 (×2): 1000 mL

## 2018-02-21 MED ORDER — DEXAMETHASONE SODIUM PHOSPHATE 10 MG/ML IJ SOLN
INTRAMUSCULAR | Status: AC
  Filled 2018-02-21: qty 1

## 2018-02-21 MED ORDER — LIDOCAINE-EPINEPHRINE 1 %-1:100000 IJ SOLN
INTRAMUSCULAR | Status: AC
  Filled 2018-02-21: qty 1

## 2018-02-21 MED ORDER — PROMETHAZINE HCL 25 MG PO TABS: 12.5000 mg | ORAL_TABLET | ORAL | Status: DC | PRN

## 2018-02-21 MED ORDER — THROMBIN 20000 UNITS EX SOLR
CUTANEOUS | Status: DC | PRN
  Administered 2018-02-21: 20000 [IU] via TOPICAL

## 2018-02-21 MED ORDER — ROCURONIUM BROMIDE 10 MG/ML (PF) SYRINGE
PREFILLED_SYRINGE | INTRAVENOUS | Status: AC
  Filled 2018-02-21: qty 10

## 2018-02-21 MED ORDER — SODIUM CHLORIDE 0.9 % IV SOLN
INTRAVENOUS | Status: DC | PRN
  Administered 2018-02-21 – 2018-02-26 (×4): 250 mL via INTRAVENOUS
  Administered 2018-02-26: 1000 mL via INTRAVENOUS
  Administered 2018-02-26: 250 mL via INTRAVENOUS

## 2018-02-21 MED ORDER — EMPTY CONTAINERS FLEXIBLE MISC
50.0000 g | Freq: Once | Status: AC
  Administered 2018-02-21: 50 g via INTRAVENOUS
  Filled 2018-02-21: qty 250

## 2018-02-21 MED ORDER — ONDANSETRON HCL 4 MG/2ML IJ SOLN
INTRAMUSCULAR | Status: AC
  Filled 2018-02-21: qty 2

## 2018-02-21 MED ORDER — BACITRACIN 50000 UNITS IM SOLR
INTRAMUSCULAR | Status: DC | PRN
  Administered 2018-02-21: 15:00:00

## 2018-02-21 MED ORDER — ONDANSETRON HCL 4 MG PO TABS: 4.0000 mg | ORAL_TABLET | ORAL | Status: DC | PRN

## 2018-02-21 MED ORDER — LIDOCAINE 2% (20 MG/ML) 5 ML SYRINGE
INTRAMUSCULAR | Status: AC
  Filled 2018-02-21: qty 5

## 2018-02-21 MED ORDER — THROMBIN (RECOMBINANT) 20000 UNITS EX SOLR
CUTANEOUS | Status: AC
  Filled 2018-02-21: qty 20000

## 2018-02-21 MED ORDER — FENTANYL CITRATE (PF) 250 MCG/5ML IJ SOLN
INTRAMUSCULAR | Status: AC
  Filled 2018-02-21: qty 5

## 2018-02-21 MED ORDER — BUPIVACAINE HCL (PF) 0.5 % IJ SOLN
INTRAMUSCULAR | Status: AC
  Filled 2018-02-21: qty 30

## 2018-02-21 MED ORDER — MICROFIBRILLAR COLL HEMOSTAT EX PADS
MEDICATED_PAD | CUTANEOUS | Status: DC | PRN
  Administered 2018-02-21: 1 via TOPICAL

## 2018-02-21 MED ORDER — SODIUM CHLORIDE 0.9 % IV SOLN
INTRAVENOUS | Status: DC | PRN
  Administered 2018-02-21: 15:00:00 via INTRAVENOUS

## 2018-02-21 MED ORDER — LIDOCAINE-EPINEPHRINE 1 %-1:100000 IJ SOLN
INTRAMUSCULAR | Status: DC | PRN
  Administered 2018-02-21: 16 mL via INTRADERMAL

## 2018-02-21 MED ORDER — SODIUM CHLORIDE 23.4 % INJECTION (4 MEQ/ML) FOR IV ADMINISTRATION
120.0000 meq | Freq: Once | INTRAVENOUS | Status: AC
  Administered 2018-02-21: 120 meq via INTRAVENOUS
  Filled 2018-02-21: qty 30

## 2018-02-21 MED ORDER — ONDANSETRON HCL 4 MG/2ML IJ SOLN: 4.0000 mg | INTRAMUSCULAR | Status: DC | PRN

## 2018-02-21 MED ORDER — HEMOSTATIC AGENTS (NO CHARGE) OPTIME
TOPICAL | Status: DC | PRN
  Administered 2018-02-21 (×2): 1 via TOPICAL

## 2018-02-21 SURGICAL SUPPLY — 74 items
BAG DECANTER FOR FLEXI CONT (MISCELLANEOUS) ×2 IMPLANT
BANDAGE GAUZE 4  KLING STR (GAUZE/BANDAGES/DRESSINGS) IMPLANT
BIT DRILL WIRE PASS 1.3MM (BIT) ×1 IMPLANT
BLADE CLIPPER SURG (BLADE) ×2 IMPLANT
BLADE SURG 11 STRL SS (BLADE) ×2 IMPLANT
BNDG COHESIVE 4X5 TAN NS LF (GAUZE/BANDAGES/DRESSINGS) IMPLANT
BUR ACORN 9.0 PRECISION (BURR) ×2 IMPLANT
BUR SPIRAL ROUTER 2.3 (BUR) ×2 IMPLANT
CANISTER SUCT 3000ML PPV (MISCELLANEOUS) ×2 IMPLANT
CARTRIDGE OIL MAESTRO DRILL (MISCELLANEOUS) ×1 IMPLANT
CLIP RANEY DISP (INSTRUMENTS) ×2 IMPLANT
CLIP VESOCCLUDE MED 6/CT (CLIP) IMPLANT
COVER BACK TABLE 60X90IN (DRAPES) IMPLANT
DECANTER SPIKE VIAL GLASS SM (MISCELLANEOUS) ×4 IMPLANT
DERMABOND ADVANCED (GAUZE/BANDAGES/DRESSINGS) ×1
DERMABOND ADVANCED .7 DNX12 (GAUZE/BANDAGES/DRESSINGS) ×1 IMPLANT
DIFFUSER DRILL AIR PNEUMATIC (MISCELLANEOUS) ×2 IMPLANT
DRAPE NEUROLOGICAL W/INCISE (DRAPES) ×2 IMPLANT
DRAPE SURG 17X23 STRL (DRAPES) IMPLANT
DRAPE WARM FLUID 44X44 (DRAPE) ×2 IMPLANT
DRILL WIRE PASS 1.3MM (BIT) ×2
DRSG OPSITE 4X5.5 SM (GAUZE/BANDAGES/DRESSINGS) ×2 IMPLANT
DRSG OPSITE POSTOP 4X10 (GAUZE/BANDAGES/DRESSINGS) ×2 IMPLANT
DURAMATRIX ONLAY 2X2 (Neuro Prosthesis/Implant) ×2 IMPLANT
DURAMATRIX ONLAY 3X3 (Plate) ×2 IMPLANT
ELECT CAUTERY BLADE 6.4 (BLADE) ×2 IMPLANT
ELECT REM PT RETURN 9FT ADLT (ELECTROSURGICAL) ×2
ELECTRODE REM PT RTRN 9FT ADLT (ELECTROSURGICAL) ×1 IMPLANT
GAUZE SPONGE 4X4 12PLY STRL (GAUZE/BANDAGES/DRESSINGS) IMPLANT
GAUZE SPONGE 4X4 12PLY STRL LF (GAUZE/BANDAGES/DRESSINGS) ×2 IMPLANT
GAUZE SPONGE 4X4 16PLY XRAY LF (GAUZE/BANDAGES/DRESSINGS) IMPLANT
GLOVE BIO SURGEON STRL SZ7 (GLOVE) IMPLANT
GLOVE BIO SURGEON STRL SZ8 (GLOVE) ×2 IMPLANT
GLOVE BIOGEL PI IND STRL 7.0 (GLOVE) IMPLANT
GLOVE BIOGEL PI IND STRL 8.5 (GLOVE) ×1 IMPLANT
GLOVE BIOGEL PI INDICATOR 7.0 (GLOVE)
GLOVE BIOGEL PI INDICATOR 8.5 (GLOVE) ×1
GLOVE ECLIPSE 8.0 STRL XLNG CF (GLOVE) ×4 IMPLANT
GLOVE EXAM NITRILE LRG STRL (GLOVE) IMPLANT
GLOVE EXAM NITRILE XL STR (GLOVE) IMPLANT
GLOVE EXAM NITRILE XS STR PU (GLOVE) IMPLANT
GLOVE INDICATOR 8.5 STRL (GLOVE) ×2 IMPLANT
GOWN STRL REUS W/ TWL LRG LVL3 (GOWN DISPOSABLE) ×1 IMPLANT
GOWN STRL REUS W/ TWL XL LVL3 (GOWN DISPOSABLE) ×1 IMPLANT
GOWN STRL REUS W/TWL 2XL LVL3 (GOWN DISPOSABLE) ×4 IMPLANT
GOWN STRL REUS W/TWL LRG LVL3 (GOWN DISPOSABLE) ×1
GOWN STRL REUS W/TWL XL LVL3 (GOWN DISPOSABLE) ×1
HEMOSTAT SURGICEL 2X14 (HEMOSTASIS) ×2 IMPLANT
KIT BASIN OR (CUSTOM PROCEDURE TRAY) ×2 IMPLANT
KIT TURNOVER KIT B (KITS) ×2 IMPLANT
NEEDLE HYPO 25X1 1.5 SAFETY (NEEDLE) ×2 IMPLANT
NS IRRIG 1000ML POUR BTL (IV SOLUTION) ×2 IMPLANT
OIL CARTRIDGE MAESTRO DRILL (MISCELLANEOUS) ×2
PACK CRANIOTOMY CUSTOM (CUSTOM PROCEDURE TRAY) ×2 IMPLANT
PAD ARMBOARD 7.5X6 YLW CONV (MISCELLANEOUS) ×2 IMPLANT
PATTIES SURGICAL .25X.25 (GAUZE/BANDAGES/DRESSINGS) IMPLANT
PATTIES SURGICAL .5 X.5 (GAUZE/BANDAGES/DRESSINGS) IMPLANT
PATTIES SURGICAL .5 X3 (DISPOSABLE) ×2 IMPLANT
PATTIES SURGICAL 1X1 (DISPOSABLE) IMPLANT
PIN MAYFIELD SKULL DISP (PIN) IMPLANT
SPONGE NEURO XRAY DETECT 1X3 (DISPOSABLE) IMPLANT
SPONGE SURGIFOAM ABS GEL 100 (HEMOSTASIS) IMPLANT
STAPLER SKIN PROX WIDE 3.9 (STAPLE) ×4 IMPLANT
STAPLER VISISTAT 35W (STAPLE) ×2 IMPLANT
SUT ETHILON 3 0 FSL (SUTURE) IMPLANT
SUT NURALON 4 0 TR CR/8 (SUTURE) ×4 IMPLANT
SUT VIC AB 2-0 CT1 18 (SUTURE) ×8 IMPLANT
SUT VIC AB 3-0 SH 8-18 (SUTURE) ×4 IMPLANT
SYR CONTROL 10ML LL (SYRINGE) ×2 IMPLANT
TOWEL GREEN STERILE (TOWEL DISPOSABLE) ×2 IMPLANT
TOWEL GREEN STERILE FF (TOWEL DISPOSABLE) ×2 IMPLANT
TRAY FOLEY MTR SLVR 16FR STAT (SET/KITS/TRAYS/PACK) IMPLANT
UNDERPAD 30X30 (UNDERPADS AND DIAPERS) IMPLANT
WATER STERILE IRR 1000ML POUR (IV SOLUTION) ×2 IMPLANT

## 2018-02-21 NOTE — Transfer of Care (Signed)
Immediate Anesthesia Transfer of Care Note  Patient: Xavier Villegas  Procedure(s) Performed: DECOMPRESSIVE CRANIECTOMY with bone flap to abdomen (Left )  Patient Location: PACU  Anesthesia Type:General  Level of Consciousness: Patient remains intubated per anesthesia plan  Airway & Oxygen Therapy: Patient remains intubated per anesthesia plan and Patient placed on Ventilator (see vital sign flow sheet for setting)  Post-op Assessment: Report given to RN and Post -op Vital signs reviewed and stable  Post vital signs: Reviewed and stable  Last Vitals:  Vitals Value Taken Time  BP 117/82 02/21/2018  4:45 PM  Temp 37.3 C 02/21/2018  4:45 PM  Pulse 67 02/21/2018  4:45 PM  Resp 16 02/21/2018  4:45 PM  SpO2 100 % 02/21/2018  4:45 PM    Last Pain:  Vitals:   02/21/18 1645  TempSrc: Axillary         Complications: No apparent anesthesia complications

## 2018-02-21 NOTE — Consult Note (Addendum)
Cardiology Consultation:   Patient ID: Xavier Villegas; [id number]; 02-28-1981   Admit date: 02/19/2018 Date of Consult: 02/21/2018  Primary Care Provider: Patient, No Pcp Per Primary Cardiologist: Karma Greaser   Patient Profile:   Xavier Villegas is a 37 y.o. male with history of tobacco use who is being seen today for the evaluation of aortic valve mass consistent with aortic valve vegetation at the request of Dr. Erlinda Hong.   History of Present Illness:   Xavier Villegas is a 37 year old male who presented to [facility] with right-sided weakness and aphasia on 02/19/2018. Unfortunately, due to his current status, history difficult to obtain and was taken from chart and family at bedside. Patient's family states that there was some question of hand symptoms (trouble writing and numbness) for the past week. On day of admission, he was found on the floor by his girlfriend with reports he had been last seen normal at bedtime. Given his significant others findings, EMS was called for transport to [facility] for further evaluation.    Upon transport, EMS activated code stroke. CT of his head was suggestive of a large area of infarct, an apparent embolic left MCA stroke secondary to presumably spontaneous left ICA dissection. He was then taken to MRI for consideration of IR if his infarct burden appeared to be less than estimated by CTP.  Interventional radiology was consulted for possible IR intervention.  Per IR note, there was concern for reperfusion malignant edema/hemorrhage. The decision was then made to proceed with mechanical thrombectomy performed on 02/19/2018, though this was complicated by intracranial hemorrhage. Patient is now intubated and sedated on propofol.  Per family, he had complaints of a migraine headache approximately 3 weeks ago as well as complaints of mild lower extremity swelling. He has a hx of tobacco and mild alcohol use and no history of illicit drug use. UDS negative. He has a  strong family history of CVA in both paternal and maternal grandparents.  Family at bedside report no recent fevers, chills, cough, chest pain, palpitations or syncope.  His father does report that he was recently treated at an urgent care for a lower lip cyst which was drained and he was treated with antibiotics.  An echocardiogram was performed 02/20/2018 which revealed a small, mobile vegetation on the left ventricular aspect (small-medium sized mobile mass) consistent with vegetation, aortic valve endocarditis.  Cardiology has been consulted given the above echocardiogram findings.  Past Medical History:  Diagnosis Date  . Tobacco abuse     The histories are not reviewed yet. Please review them in the "History" navigator section and refresh this [place].   Prior to Admission medications   Not on File    Inpatient Medications: Scheduled Meds: .  stroke: mapping our early stages of recovery book   Does not apply Once  . aspirin  325 mg Oral Daily   Or  . aspirin  300 mg Rectal Daily  . chlorhexidine gluconate ([name] KIT)  15 mL Mouth Rinse BID  . Chlorhexidine Gluconate Cloth  6 each Topical Q0600  . feeding supplement (PRO-STAT SUGAR FREE 64)  30 mL Per Tube BID  . feeding supplement (VITAL HIGH PROTEIN)  1,000 mL Per Tube Q24H  . folic acid  1 mg Oral Daily  . mouth rinse  15 mL Mouth Rinse 10 times per day  . mupirocin ointment  1 application Nasal BID  . vitamin B-12  1,000 mcg Oral Daily   Continuous Infusions: . clevidipine Stopped (  02/19/18 2042)  . famotidine (PEPCID) IV    . propofol (DIPRIVAN) infusion 50 mcg/kg/min (02/21/18 0600)  . sodium chloride (hypertonic) 75 mL/hr (02/21/18 0859)   PRN Meds: acetaminophen **OR** acetaminophen (TYLENOL) oral liquid 160 mg/5 mL **OR** acetaminophen, docusate, fentaNYL (SUBLIMAZE) injection, iohexol  Allergies:   No Known Allergies  Social History:   Social History   Socioeconomic History  . Marital status: Single     Spouse name: Not on file  . Number of children: Not on file  . Years of education: Not on file  . Highest education level: Not on file  Occupational History  . Not on file  Social Needs  . Financial resource strain: Not on file  . Food insecurity:    Worry: Not on file    Inability: Not on file  . Transportation needs:    Medical: Not on file    Non-medical: Not on file  Tobacco Use  . Smoking status: Current Every Day Smoker    Packs/day: 0.50    Types: Cigarettes  . Smokeless tobacco: Never Used  Substance and Sexual Activity  . Alcohol use: Yes    Alcohol/week: 8.4 oz    Types: 14 Cans of beer per week  . Drug use: Never  . Sexual activity: Yes  Lifestyle  . Physical activity:    Days per week: Not on file    Minutes per session: Not on file  . Stress: Not on file  Relationships  . Social connections:    Talks on phone: Not on file    Gets together: Not on file    Attends religious service: Not on file    Active member of club or organization: Not on file    Attends meetings of clubs or organizations: Not on file    Relationship status: Not on file  . Intimate partner violence:    Fear of current or ex partner: Not on file    Emotionally abused: Not on file    Physically abused: Not on file    Forced sexual activity: Not on file  Other Topics Concern  . Not on file  Social History Narrative  . Not on file    Family History:   Family History  Problem Relation Age of Onset  . Stroke Maternal Grandmother   . Stroke Maternal Grandfather    Family Status:  Family Status  Relation Name Status  . MGM  (Not Specified)  . MGF  (Not Specified)    ROS:  Please see the history of present illness.  All other ROS reviewed and negative.     Physical Exam/Data:   Vitals:   02/21/18 0800 02/21/18 0815 02/21/18 1131 02/21/18 1200  BP:  129/76 (!) 143/87   Pulse:  (!) 55 69   Resp:  16 18   Temp: (!) 97.2 F (36.2 C)   99.7 F (37.6 C)  TempSrc: Axillary    Axillary  SpO2:  100% 97%   Weight:      Height:        Intake/Output Summary (Last 24 hours) at 02/21/2018 1417 Last data filed at 02/21/2018 0700 Gross per 24 hour  Intake 2782.79 ml  Output 765 ml  Net 2017.79 ml   Filed Weights   02/19/18 1600 02/21/18 0500  Weight: 182 lb 15.7 oz (83 kg) 189 lb 9.5 oz (86 kg)   Body mass index is 25.71 kg/m.   General: Intubated and sedated,  NAD Skin: Warm, dry, intact  Head:  Normocephalic, atraumatic, clear, moist mucus membranes. Neck: Negative for carotid bruits. No JVD Lungs:Clear to ausculation bilaterally. No wheezes, rales, or rhonchi. ETT for ventilation  Cardiovascular: RRR with S1 S2. No murmurs, rubs or gallops Abdomen: Soft, non-tender, non-distended with normoactive bowel sounds. No obvious abdominal masses. MSK: Strength and tone appear normal for age on left upper and lower extremity. Flaccid on upper and lower right  Extremities: No edema. No clubbing or cyanosis. DP/PT pulses 1+ bilaterally Neuro: Intubated and sedated. No facial asymmetry. Moves left upper and lower extremity  spontaneously. Psych: Intubated and sedated.   EKG:  The EKG was personally reviewed and demonstrates:  None on file  Telemetry:  Telemetry was personally reviewed and demonstrates: 02/21/18 NSR HR 58  Relevant CV Studies:  ECHO: 02/20/2018: Study Conclusions  - Left ventricle: The cavity size was normal. Wall thickness was   increased in a pattern of mild LVH. Systolic function was normal.   The estimated ejection fraction was in the range of 55% to 60%.   Wall motion was normal; there were no regional wall motion   abnormalities. Left ventricular diastolic function parameters   were normal. - Aortic valve: There was a small, mobile vegetation on the left   ventricular aspect.  Impressions:  - There is a small - medium sized mobile mass on the LV aspect of   the aortic valve.   The appearence is c/w vegetation.   Aortic valve  endocarditis  CATH: None  Laboratory Data:  Chemistry Recent Labs  Lab 02/19/18 1215 02/19/18 1219 02/20/18 0547  02/20/18 2302 02/21/18 0539 02/21/18 1157  NA 140 139 142   < > 145 150* 153*  K 3.8 3.5 4.0  --   --  3.4*  --   CL 105 101 110  --   --  118*  --   CO2 22  --  23  --   --  25  --   GLUCOSE 141* 137* 133*  --   --  120*  --   BUN <5* <3* 5*  --   --  12  --   CREATININE 0.73 0.80 0.71  --   --  0.70  --   CALCIUM 8.7*  --  7.9*  --   --  8.0*  --   GFRNONAA >60  --  >60  --   --  >60  --   GFRAA >60  --  >60  --   --  >60  --   ANIONGAP 13  --  9  --   --  7  --    < > = values in this interval not displayed.    Total Protein  Date Value Ref Range Status  02/19/2018 7.2 6.5 - 8.1 g/dL Final   Albumin  Date Value Ref Range Status  02/19/2018 3.4 (L) 3.5 - 5.0 g/dL Final   AST  Date Value Ref Range Status  02/19/2018 28 15 - 41 U/L Final   ALT  Date Value Ref Range Status  02/19/2018 33 0 - 44 U/L Final    Comment:    Please note change in reference range.   Alkaline Phosphatase  Date Value Ref Range Status  02/19/2018 96 38 - 126 U/L Final   Total Bilirubin  Date Value Ref Range Status  02/19/2018 1.1 0.3 - 1.2 mg/dL Final   Hematology Recent Labs  Lab 02/19/18 1215 02/19/18 1219 02/20/18 0547 02/21/18 0539  WBC 15.4*  --  19.5* 14.2*  RBC 4.61  --  3.94* 3.93*  HGB 18.0* 18.0* 15.2 15.1  HCT 48.7 53.0* 43.0 44.2  MCV 105.6*  --  109.1* 112.5*  MCH 39.0*  --  38.6* 38.4*  MCHC 37.0*  --  35.3 34.2  RDW 13.0  --  13.2 13.5  PLT 265  --  174 157   Cardiac EnzymesNo results for input(s): TROPONINI in the last 168 hours.  Recent Labs  Lab 02/19/18 1218  TROPIPOC 0.00    BNPNo results for input(s): BNP, PROBNP in the last 168 hours.  DDimer No results for input(s): DDIMER in the last 168 hours. TSH:  Lab Results  Component Value Date   TSH 1.000 02/20/2018   Lipids: Lab Results  Component Value Date   CHOL 101 02/20/2018     HDL 34 (L) 02/20/2018   LDLCALC 46 02/20/2018   TRIG 103 02/20/2018   CHOLHDL 3.0 02/20/2018   HgbA1c: Lab Results  Component Value Date   HGBA1C 4.9 02/20/2018    Radiology/Studies:  Ct Angio Head W Or Wo Contrast  Result Date: 02/19/2018 CLINICAL DATA:  Stroke.  Right-sided weakness aphasia EXAM: CT ANGIOGRAPHY HEAD AND NECK CT PERFUSION BRAIN TECHNIQUE: Multidetector CT imaging of the head and neck was performed using the standard protocol during bolus administration of intravenous contrast. Multiplanar CT image reconstructions and MIPs were obtained to evaluate the vascular anatomy. Carotid stenosis measurements (when applicable) are obtained utilizing NASCET criteria, using the distal internal carotid diameter as the denominator. Multiphase CT imaging of the brain was performed following IV bolus contrast injection. Subsequent parametric perfusion maps were calculated using RAPID software. CONTRAST:  39m ISOVUE-370 IOPAMIDOL (ISOVUE-370) INJECTION 76% COMPARISON:  CT head 02/19/2018 FINDINGS: CTA NECK FINDINGS Aortic arch: Normal aortic arch without atherosclerotic disease or aneurysm. Proximal great vessels normal Right carotid system: Normal right carotid. No evidence of atherosclerotic disease dissection or stenosis Left carotid system: Irregularity and stenosis of the proximal left internal carotid artery compatible with dissection. No significant calcification. Intraluminal filling defect compatible with thrombus. Left internal carotid artery small in caliber but patent to the cavernous segment. There is occlusion of the supraclinoid internal carotid artery on the left. Vertebral arteries: Normal Skeleton: Normal.  Periapical cyst around right upper incisor Other neck: Negative for mass or adenopathy. Upper chest: Negative Review of the MIP images confirms the above findings CTA HEAD FINDINGS Anterior circulation: Slow flow with small caliber of the left cavernous carotid which then  occludes in the supraclinoid component. Occlusion of the proximal left A1. Occlusion of left M1 segment. Occlusion of left M2 branches diffusely. Right middle cerebral artery widely patent. A2 segments patent bilaterally. Posterior circulation: Both vertebral arteries patent to the basilar. Left vertebral dominant. PICA patent bilaterally. Basilar patent. Superior cerebellar and posterior cerebral arteries patent bilaterally. Venous sinuses: Negative Anatomic variants: None Delayed phase: Not performed Review of the MIP images confirms the above findings CT Brain Perfusion Findings: CBF (<30%) Volume: 1328mPerfusion (Tmax>6.0s) volume: 1714mismatch Volume: 33m92mfarction Location:Left MCA territory including left basal ganglia. IMPRESSION: Large territory left MCA acute infarct. 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 Occlusion left A1 with reconstitution left A2. Occlusion left M1 and M2 segments. Call is currently in for Dr. McNeLeonides Schanzectronically Signed   By: CharFranchot Gallo.   On: 02/19/2018 12:59   Dg Chest 1 View  Result Date:  02/19/2018 CLINICAL DATA:  37 year old male with history of stroke. Post procedure chest x-ray. EXAM: CHEST  1 VIEW COMPARISON:  No priors. FINDINGS: An endotracheal tube is in place with tip 3.1 cm above the carina. Nasogastric tube extends into the proximal stomach. Ill-defined opacity in the right lung base which is coincident with the in divisum tubing, potentially artifactual. Lung volumes are low. No other definite consolidative airspace disease. No pleural effusions. No evidence of pulmonary edema. Heart size is normal. Upper mediastinal contours are within normal limits. IMPRESSION: 1. Support apparatus, as above. 2. Low lung volumes without definite radiographic evidence of acute cardiopulmonary disease. Electronically Signed   By: Vinnie Langton M.D.   On: 02/19/2018 16:24   Ct Head Wo Contrast  Result Date: 02/21/2018 CLINICAL DATA:  Stroke follow-up EXAM: CT HEAD WITHOUT CONTRAST TECHNIQUE: Contiguous axial images were obtained from the base of the skull through the vertex without intravenous contrast. COMPARISON:  Head CT 02/19/2018 FINDINGS: Brain: There is increased hypodensity throughout the left MCA distribution in the location of the known infarct. Hyperdense material within the infarcted region has also decreased in density, with no increased volume. Mass effect on the left lateral ventricle is increased and there is now rightward midline shift measuring 4 mm at the level of the foramina of [place]. No hydrocephalus. Right hemisphere and cerebellum are normal. Vascular: Hyperattenuation of the left middle cerebral artery. Skull: The visualized skull base, calvarium and extracranial soft tissues are normal. Sinuses/Orbits: No fluid levels or advanced mucosal thickening of the visualized paranasal sinuses. No mastoid or middle ear effusion. The orbits are normal. IMPRESSION: 1. Progression of cytotoxic edema throughout the left MCA distribution, causing slight mass effect on the left lateral ventricle with 4 mm of rightward midline shift. No hydrocephalus. 2. Decreased density in unchanged distribution of hyperattenuating material in the left MCA territory, favored to indicate resolving contrast staining. Electronically Signed   By: Ulyses Jarred M.D.   On: 02/21/2018 05:15   Ct Head Wo Contrast  Result Date: 02/19/2018 CLINICAL DATA:  Followup revascularization attempt. Left MCA territory infarction secondary to acute dissection of the left internal carotid artery. EXAM: CT HEAD WITHOUT CONTRAST TECHNIQUE: Contiguous axial images were obtained from the base of the skull through the vertex without intravenous contrast. COMPARISON:  Multiple examinations earlier today. FINDINGS: Brain: Developing left MCA territory infarction. Contrast  staining possibly admixed with hemorrhage in the insular region. Diminished contrast staining elsewhere in the region of the infarction. Early swelling but no mass effect or shift at this time. No hydrocephalus. No extra-axial collection. Vascular: Some vascular contrast still evident. Skull: Normal Sinuses/Orbits: Clear/normal Other: None IMPRESSION: Left MCA territory infarction with early low-density and swelling but no mass effect or shift at this time. I think most the hyperdensity seen previously relates to contrast staining, much of which is diminishing or resolved. There is some persistent contrast staining in the region of the insula and I could not exclude that there is a small amount of admixed hemorrhage. Electronically Signed   By: Nelson Chimes M.D.   On: 02/19/2018 20:33   Ct Head Wo Contrast  Result Date: 02/19/2018 CLINICAL DATA:  Stroke post intervention. EXAM: CT HEAD WITHOUT CONTRAST TECHNIQUE: Contiguous axial images were obtained from the base of the skull through the vertex without intravenous contrast. COMPARISON:  CT head 02/19/2018 FINDINGS: Brain: Large territory left MCA infarct has become more evident on CT with diffuse low-density throughout the left MCA territory including the basal  ganglia. Interval development of patchy areas of acute hemorrhage within the infarct involving the left frontal, left parietal, and left insular region. Chronic hemorrhage also in the left lateral basal ganglia. High density in the left posterior insula likely represents extravasation of contrast. Mild local mass-effect without midline shift. Ventricle size remains normal. No other acute infarct. Vascular: Diffuse arterial enhancement due to prior angiography in CTA Skull: Negative Sinuses/Orbits: Mild mucosal edema paranasal sinuses. Negative orbit Other: None IMPRESSION: Large territory acute infarct left MCA territory. Interval development of multiple areas of patchy hemorrhage within the infarct  following clot retrieval. No midline shift. The images were reviewed with Dr. Earleen Newport. Electronically Signed   By: Franchot Gallo M.D.   On: 02/19/2018 16:18   Ct Angio Neck W Or Wo Contrast  Result Date: 02/19/2018 CLINICAL DATA:  Stroke.  Right-sided weakness aphasia EXAM: CT ANGIOGRAPHY HEAD AND NECK CT PERFUSION BRAIN TECHNIQUE: Multidetector CT imaging of the head and neck was performed using the standard protocol during bolus administration of intravenous contrast. Multiplanar CT image reconstructions and MIPs were obtained to evaluate the vascular anatomy. Carotid stenosis measurements (when applicable) are obtained utilizing NASCET criteria, using the distal internal carotid diameter as the denominator. Multiphase CT imaging of the brain was performed following IV bolus contrast injection. Subsequent parametric perfusion maps were calculated using RAPID software. CONTRAST:  26m ISOVUE-370 IOPAMIDOL (ISOVUE-370) INJECTION 76% COMPARISON:  CT head 02/19/2018 FINDINGS: CTA NECK FINDINGS Aortic arch: Normal aortic arch without atherosclerotic disease or aneurysm. Proximal great vessels normal Right carotid system: Normal right carotid. No evidence of atherosclerotic disease dissection or stenosis Left carotid system: Irregularity and stenosis of the proximal left internal carotid artery compatible with dissection. No significant calcification. Intraluminal filling defect compatible with thrombus. Left internal carotid artery small in caliber but patent to the cavernous segment. There is occlusion of the supraclinoid internal carotid artery on the left. Vertebral arteries: Normal Skeleton: Normal.  Periapical cyst around right upper incisor Other neck: Negative for mass or adenopathy. Upper chest: Negative Review of the MIP images confirms the above findings CTA HEAD FINDINGS Anterior circulation: Slow flow with small caliber of the left cavernous carotid which then occludes in the supraclinoid component.  Occlusion of the proximal left A1. Occlusion of left M1 segment. Occlusion of left M2 branches diffusely. Right middle cerebral artery widely patent. A2 segments patent bilaterally. Posterior circulation: Both vertebral arteries patent to the basilar. Left vertebral dominant. PICA patent bilaterally. Basilar patent. Superior cerebellar and posterior cerebral arteries patent bilaterally. Venous sinuses: Negative Anatomic variants: None Delayed phase: Not performed Review of the MIP images confirms the above findings CT Brain Perfusion Findings: CBF (<30%) Volume: 1341mPerfusion (Tmax>6.0s) volume: 171104mismatch Volume: 74m9mfarction Location:Left MCA territory including left basal ganglia. IMPRESSION: Large territory left MCA acute infarct. 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 Occlusion left A1 with reconstitution left A2. Occlusion left M1 and M2 segments. Call is currently in for Dr. McNeLeonides Schanzectronically Signed   By: CharFranchot Gallo.   On: 02/19/2018 12:59   Mr Mra Jodene Namd Wo Contrast  Result Date: 02/20/2018 CLINICAL DATA:  Stroke post left MCA clot retrieval. Left carotid dissection EXAM: MRI HEAD WITHOUT CONTRAST MRA HEAD WITHOUT CONTRAST TECHNIQUE: Multiplanar, multiecho pulse sequences of the brain and surrounding structures were obtained without intravenous contrast. Angiographic images of the head were obtained using MRA technique without contrast. COMPARISON:  CT head 02/19/2018 FINDINGS: MRI HEAD FINDINGS Brain: Large territory acute infarct left MCA territory. Acute infarct throughout the left basal ganglia and entire left MCA territory. Mild amount of hemorrhage in the left frontal and parietal lobe. Diffuse edema with mass-effect and 2 mm midline shift to the right. No other areas of infarct hemorrhage or mass. Vascular: Thrombosed left internal carotid artery.  Normal flow void right internal carotid artery and posterior circulation Skull and upper cervical spine: Negative Sinuses/Orbits: Negative Other: None MRA HEAD FINDINGS Left internal carotid artery is occluded through the cervical and cavernous segment. Left middle cerebral artery is occluded including the left M1 M2 and M3 segments. Left anterior cerebral artery is patent presumably supplied from the right. Right internal carotid artery widely patent. Right anterior and middle cerebral arteries normal. Both vertebral arteries patent to the basilar. PICA patent bilaterally. Basilar widely patent. Superior cerebellar and posterior cerebral arteries normal bilaterally Negative for cerebral aneurysm IMPRESSION: Acute infarct of the entire left MCA territory with mild associated hemorrhage in the left parietal and frontal lobe. Diffuse edema. 2 mm midline shift to the right. No other infarct Occlusion of the left internal carotid artery and entire left middle cerebral artery Electronically Signed   By: Franchot Gallo M.D.   On: 02/20/2018 10:52   Mr Brain Wo Contrast  Result Date: 02/20/2018 CLINICAL DATA:  Stroke post left MCA clot retrieval. Left carotid dissection EXAM: MRI HEAD WITHOUT CONTRAST MRA HEAD WITHOUT CONTRAST TECHNIQUE: Multiplanar, multiecho pulse sequences of the brain and surrounding structures were obtained without intravenous contrast. Angiographic images of the head were obtained using MRA technique without contrast. COMPARISON:  CT head 02/19/2018 FINDINGS: MRI HEAD FINDINGS Brain: Large territory acute infarct left MCA territory. Acute infarct throughout the left basal ganglia and entire left MCA territory. Mild amount of hemorrhage in the left frontal and parietal lobe. Diffuse edema with mass-effect and 2 mm midline shift to the right. No other areas of infarct hemorrhage or mass. Vascular: Thrombosed left internal carotid artery. Normal flow void right internal carotid artery and posterior  circulation Skull and upper cervical spine: Negative Sinuses/Orbits: Negative Other: None MRA HEAD FINDINGS Left internal carotid artery is occluded through the cervical and cavernous segment. Left middle cerebral artery is occluded including the left M1 M2 and M3 segments. Left anterior cerebral artery is patent presumably supplied from the right. Right internal carotid artery widely patent. Right anterior and middle cerebral arteries normal. Both vertebral arteries patent to the basilar. PICA patent bilaterally. Basilar widely patent. Superior cerebellar and posterior cerebral arteries normal bilaterally Negative for cerebral aneurysm IMPRESSION: Acute infarct of the entire left MCA territory with mild associated hemorrhage in the left parietal and frontal lobe. Diffuse edema. 2 mm midline shift to the right. No other infarct Occlusion of the left internal carotid artery and entire left middle cerebral artery Electronically Signed   By: Franchot Gallo M.D.   On: 02/20/2018 10:52   [place]  Result Date: 02/20/2018 INDICATION: 37 year old male with a history of acute left MCA stroke Given the patient's age and baseline function, thrombectomy was offered to the patient's family as a possibility of improving his overall function and giving him the best chance of recovery, accepting a high risk of intracranial hemorrhage/malignant edema given the size of the infarction. EXAM: ULTRASOUND GUIDED ACCESS RIGHT COMMON FEMORAL ARTERY CEREBRAL ANGIOGRAM MECHANICAL THROMBECTOMY LEFT MCA CLOSURE OF ACCESS SITE WITH ANGIO-SEAL DEVICE COMPARISON:  CT 02/19/2018, CT ANGIOGRAM 02/19/2018 MEDICATIONS: 2  g Ancef. The antibiotic was administered within 1 hour of the procedure ANESTHESIA/SEDATION: GENERAL ENDOTRACHEAL TUBE ANESTHESIA WITH THE ANESTHESIA TEAM CONTRAST:  140 cc Isovue FLUOROSCOPY TIME:  Fluoroscopy Time: 35 minutes 6 seconds (2373 mGy). COMPLICATIONS: None TECHNIQUE: Informed written consent was obtained from  the patient's family after a thorough discussion of the procedural risks, benefits and alternatives. Specific risks discussed include: Bleeding, infection, contrast reaction, kidney injury/failure, need for further procedure/surgery, arterial injury or dissection, embolization to new territory, intracranial hemorrhage (10-15% risk), neurologic deterioration, cardiopulmonary collapse, death. All questions were addressed. Maximal Sterile Barrier Technique was utilized including during the procedure including caps, mask, sterile gowns, sterile gloves, sterile drape, hand hygiene and skin antiseptic. A timeout was performed prior to the initiation of the procedure. The anesthesia team was present to provide general endotracheal tube anesthesia and for patient monitoring during the procedure. Interventional neuro radiology nursing staff was also present. FINDINGS: Ultrasound images were performed of the right inguinal region, confirming patency of the right common femoral artery. Images were stored and sent to PACs. Initial Findings: Left common carotid artery:  Normal course caliber and contour. Left external carotid artery: Patent with antegrade flow. Left internal carotid artery: There is irregular appearance of the posterior proximal internal carotid artery extending from the carotid bulb along the proximal 3rd of the right internal carotid artery. This results in at least 50 percent narrowing, with irregular blunted appearance on the distal filling defect, compatible with thrombus/dissection. The mid and distal cervical segment of the ICA is patent. Unremarkable petrous segment, lacerum segment, cavernous segment, clinoid segment/ophthalmic segment and terminal segment. Left MCA: MCA occluded at the origin with a reversed meniscus suggesting acute thrombus. Patent anterior choroidal artery and a small posterior communicating artery, which washes out from collateral flow from the posterior. The initial injection  demonstrates patent meningo-hypophyseal branch, with sellar blush. Left ACA: A 1 segment patent. A 2 segment perfuses the left territory. Patent anterior communicating artery with transient perfusion of the right ACA territory. Completion Findings: Left MCA: After mechanical thrombectomy, there is reperfusion of the middle cerebral artery, with slow flow through the insular branches, TICI 2b. Early venous drainage is visualized. Upon withdrawal into the common carotid artery, there is occlusion of the proximal cervical ICA secondary to acute dissection/thrombus. External carotid artery remains patent, with early perfusion changes of external to internal collateralization, as there is filling of the carotid siphon via ascending cervical. Right common carotid artery:  Normal course caliber and contour. Right external carotid artery: Patent with antegrade flow. Right internal carotid artery: Normal course caliber and contour of the cervical portion. Vertical and petrous segment patent with normal course caliber contour. Lacerum segment patent. cavernous segment patent. Clinoid segment patent. Antegrade flow of the ophthalmic artery. Ophthalmic segment patent. Terminus patent. Crossover flow into the left hemisphere, with perfusion of both the ACA territory and the MCA territory at the conclusion. Right MCA: M1 segment patent. Insular and opercular segments patent. Unremarkable caliber and course of the cortical segments. Typical arterial, capillary/ parenchymal, and venous phase. Right ACA: A 1 segment patent. A 2 segment perfuses the right territory. Right vertebral: Cervical vertebral artery have normal course caliber and contour with expected filling of the C3, C2, C1 branches. The dominant runoff of the right vertebral artery is into a right posterior inferior cerebellar artery, with a small right vertebral artery to basilar contribution. The majority of flow into the basilar from the right is washed out from a  left vertebral  artery inflow. Left vertebral: Normal course caliber and contour of left vertebral artery. Dominant left vertebral artery. Expected filling of the C3, C2, C1 branches. Dominant vertebral artery contributes to basilar artery, with patent left posterior inferior cerebellar artery, patent bilateral superior cerebellar artery, and patent bilateral posterior cerebral artery, with symmetric opacification from the left vertebral injection. The left posterior cerebral artery contributes to perfusion of the left temporal lobe and occipital lobe. Flat panel CT: CT demonstrates no significant mass effect, however, at the conclusion there is evidence of contrast staining of the left MCA territory gyri as well as a combination of ICH/SAH at the left basal ganglia and sylvian fissure. Evidence of involving left MCA infarction. PROCEDURE: Patient is brought emergently to the neuro angiography suite, with the patient identified appropriately and placed supine position on the table. Left radial arterial line was placed by the anesthesia team. The patient is then prepped and draped in the usual sterile fashion. Ultrasound survey of the right inguinal region was performed with images stored and sent to PACs, confirming patency of the common femoral artery. 11 blade scalpel was used to make a small incision. Blunt dissection was performed. A micropuncture needle was used access the right common femoral artery under ultrasound. With excellent arterial blood flow returned, an .018 micro wire was passed through the needle, observed to enter the abdominal aorta under fluoroscopy. The needle was removed, and a micropuncture sheath was placed over the wire. The inner dilator and wire were removed, and an 035 Bentson wire was advanced under fluoroscopy into the abdominal aorta. The sheath was removed and a standard 5 [place] vascular sheath was placed. The dilator was removed and the sheath was flushed. A 72F JB-1 diagnostic  catheter was advanced over the wire to the proximal descending thoracic aorta. Wire was then removed. Double flush of the catheter was performed. Catheter was then used to select the left common carotid artery. Formal angiogram was performed of the cervical and cerebral vessels. Glidewire was then used to navigate across the dissected proximal cervical ICA. Diagnostic catheter was then advanced into the distal cervical ICA. Exchange length Rosen wire was then passed through the diagnostic catheter to the distal cervical ICA and the diagnostic catheter was removed. The 5 French sheath was removed and exchanged for 8 French 55 centimeter BrightTip sheath. Sheath was flushed and attached to pressurized and heparinized saline bag for constant forward flow. Then a neuron Max 80cm catheter was prepared on the back table. Ace 68 intermediate catheter was then loaded though the Neuron Max catheter with coaxial microcatheter, and advanced through the guide catheter. Under roadmap angiogram, Microcatheter system was then introduced through the Ace catheter using a synchro soft 014 wire and a Trevo Provue18 catheter. Microcatheter system was advanced into the internal carotid artery, to the level of the occlusion. The micro wire and the microcatheter were carefully placed at the proximal occlusion, and the aspiration catheter was advanced to the occlusion. Microwire in the microcatheter were withdrawn, an aspiration was performed. Aspiration was initiated, confirming return of blood flow. The catheter was then advanced into the occlusion with stasis of blood flow returned. With no blood flow returned after 30 seconds-60 seconds of aspiration, the aspiration catheter was removed entirely from the neuron max. This catheter was then flushed, with clot retrieved. Repeat angiogram performed. Further targets with thrombus were identified within M2 branches. The aspiration catheter was then used as a intermediate catheter, with the  microcatheter advanced with the synchro wire.  Under road map angiogram, the microcatheter and microwire were advanced beyond the M2 thrombus. The wire was removed and gentle aspiration was performed confirming blood flow returned. Gentle injection was performed to prove intraluminal location. A rotating hemostatic valve was then attached to the back end of the microcatheter, and a pressurized and heparinized saline bag was attached to the catheter. 4 x 40 solitaire device was then selected. Back flush was achieved at the rotating hemostatic valve, and then the device was gently advanced through the microcatheter to the distal end. The retriever was then unsheathed by withdrawing the microcatheter under fluoroscopy. Once the retriever was completely unsheathed, control angiogram was performed from the balloon catheter. Constant aspiration was then performed through the Ace catheter as the retriever was gently and slowly withdrawn with fluoroscopic observation. Once the retriever was entirely removed from the system, free aspiration was confirmed at the hub of the intermediate catheter, with free blood return confirmed. Control angiogram was then performed. Persistent occlusion at the proximal M2 branch was confirmed. The microcatheter system was then again advanced through the Ace catheter. Once the micro wire microcatheter were beyond the M2 occlusion, the micro wire was removed and solitaire 4 x 40 device was deployed. After the solitaire was deployed across the occlusion, the Ace catheter was advanced into the M1 segment at the site of the occlusion. Local aspiration was performed upon withdrawal of the solitaire device under fluoroscopic observation. Once the retrieve her was entirely removed from the system, free aspiration was confirmed at the hub of the intermediate catheter, with free blood return confirmed. Ace aspiration catheter was removed. Control angiogram was again performed. 035 rose in wire was then  advanced through the neuron max into the cervical segment. The neuron max was withdrawn proximal to the bifurcation with repeat angiogram performed. At this point, the results and evolution of the case as well as the patient management was thoroughly discussed with the neurology team. We elected at this time to perform flat panel CT. After the results demonstrated presence of subarachnoid/ICH, we elected not to place a carotid stent. The neuro on max was removed. Completion cerebral angiogram was then performed of the right carotid system and the bilateral vertebral arteries. Catheter was removed, and the bright tip sheath was exchanged for a short 8 French sheath at the right common femoral artery. Eight [place] Angio-Seal was deployed for hemostasis. Patient tolerated the procedure well and remained hemodynamically stable throughout. No complications were encountered. IMPRESSION: Status post ultrasound guided access right common femoral artery for cerebral angiogram and mechanical thrombectomy of left MCA ELVO, achieving TICI 2b reperfusion after 3 passes with a combination of ADAPT technique and SOLUMBRA technique using a 4x40 solitaire. Status post deployment of Angio-Seal for hemostasis at the access site. Cerebral angiogram confirms acute left ICA dissection as the source of the intracranial emboli, with cervical ICA occluded at the case conclusion. Signed, Dulcy Fanny. Dellia Nims, RPVI [facility] Radiology PLAN: Given the presence of hemorrhage, the patient will stay intubated. Straight to CT for baseline CT scan. Right common femoral artery has been closed with Angio-Seal. Local wound care and right hip straight for 4 hours. The patient will be admitted to the neuro ICU. Target blood pressure goal of less than 413 systolic Electronically Signed   By: Corrie Mckusick D.O.   On: 02/20/2018 14:20   Ir US Guide Vasc Access Right  Result Date: 02/21/2018 INDICATION:  37 year old male with a history of acute  left MCA stroke  Given the patient's age and baseline function, thrombectomy was offered to the patient's family as a possibility of improving his overall function and giving him the best chance of recovery, accepting a high risk of intracranial hemorrhage/malignant edema given the size of the infarction.  EXAM: ULTRASOUND GUIDED ACCESS RIGHT COMMON FEMORAL ARTERY  CEREBRAL ANGIOGRAM  MECHANICAL THROMBECTOMY LEFT MCA  CLOSURE OF ACCESS SITE WITH ANGIO-SEAL DEVICE  COMPARISON:  CT 02/19/2018, CT ANGIOGRAM 02/19/2018  MEDICATIONS: 2 g Ancef. The antibiotic was administered within 1 hour of the procedure  ANESTHESIA/SEDATION: GENERAL ENDOTRACHEAL TUBE ANESTHESIA WITH THE ANESTHESIA TEAM  CONTRAST:  140 cc Isovue  FLUOROSCOPY TIME:  Fluoroscopy Time: 35 minutes 6 seconds (2373 mGy).  COMPLICATIONS: None  TECHNIQUE: Informed written consent was obtained from the patient's family after a thorough discussion of the procedural risks, benefits and alternatives. Specific risks discussed include: Bleeding, infection, contrast reaction, kidney injury/failure, need for further procedure/surgery, arterial injury or dissection, embolization to new territory, intracranial hemorrhage (10-15% risk), neurologic deterioration, cardiopulmonary collapse, death. All questions were addressed. Maximal Sterile Barrier Technique was utilized including during the procedure including caps, mask, sterile gowns, sterile gloves, sterile drape, hand hygiene and skin antiseptic. A timeout was performed prior to the initiation of the procedure.  The anesthesia team was present to provide general endotracheal tube anesthesia and for patient monitoring during the procedure. Interventional neuro radiology nursing staff was also present.  FINDINGS: Ultrasound images were performed of the right inguinal region, confirming patency of the right common femoral artery. Images were stored and sent to PACs.   Initial Findings:  Left common carotid artery:  Normal course caliber and contour.  Left external carotid artery: Patent with antegrade flow.  Left internal carotid artery: There is irregular appearance of the posterior proximal internal carotid artery extending from the carotid bulb along the proximal 3rd of the right internal carotid artery. This results in at least 50 percent narrowing, with irregular blunted appearance on the distal filling defect, compatible with thrombus/dissection.  The mid and distal cervical segment of the ICA is patent. Unremarkable petrous segment, lacerum segment, cavernous segment, clinoid segment/ophthalmic segment and terminal segment.  Left MCA: MCA occluded at the origin with a reversed meniscus suggesting acute thrombus. Patent anterior choroidal artery and a small posterior communicating artery, which washes out from collateral flow from the posterior. The initial injection demonstrates patent meningo-hypophyseal branch, with sellar blush.  Left ACA: A 1 segment patent. A 2 segment perfuses the left territory. Patent anterior communicating artery with transient perfusion of the right ACA territory.  Completion Findings:  Left MCA: After mechanical thrombectomy, there is reperfusion of the middle cerebral artery, with slow flow through the insular branches, TICI 2b.  Early venous drainage is visualized.  Upon withdrawal into the common carotid artery, there is occlusion of the proximal cervical ICA secondary to acute dissection/thrombus. External carotid artery remains patent, with early perfusion changes of external to internal collateralization, as there is filling of the carotid siphon via ascending cervical.  Right common carotid artery:  Normal course caliber and contour.  Right external carotid artery: Patent with antegrade flow.  Right internal carotid artery: Normal course caliber and contour of the cervical portion. Vertical and petrous segment patent with  normal course caliber contour. Lacerum segment patent. cavernous segment patent. Clinoid segment patent. Antegrade flow of the ophthalmic artery. Ophthalmic segment patent. Terminus patent. Crossover flow into the left hemisphere, with perfusion of both the ACA territory and the MCA  territory at the conclusion.  Right MCA: M1 segment patent. Insular and opercular segments patent. Unremarkable caliber and course of the cortical segments. Typical arterial, capillary/ parenchymal, and venous phase.  Right ACA: A 1 segment patent. A 2 segment perfuses the right territory.  Right vertebral:  Cervical vertebral artery have normal course caliber and contour with expected filling of the C3, C2, C1 branches. The dominant runoff of the right vertebral artery is into a right posterior inferior cerebellar artery, with a small right vertebral artery to basilar contribution. The majority of flow into the basilar from the right is washed out from a left vertebral artery inflow.  Left vertebral:  Normal course caliber and contour of left vertebral artery. Dominant left vertebral artery. Expected filling of the C3, C2, C1 branches.  Dominant vertebral artery contributes to basilar artery, with patent left posterior inferior cerebellar artery, patent bilateral superior cerebellar artery, and patent bilateral posterior cerebral artery, with symmetric opacification from the left vertebral injection. The left posterior cerebral artery contributes to perfusion of the left temporal lobe and occipital lobe.  Flat panel CT:  CT demonstrates no significant mass effect, however, at the conclusion there is evidence of contrast staining of the left MCA territory gyri as well as a combination of ICH/SAH at the left basal ganglia and sylvian fissure. Evidence of involving left MCA infarction.  PROCEDURE: Patient is brought emergently to the neuro angiography suite, with the patient identified appropriately and placed supine position on  the table. Left radial arterial line was placed by the anesthesia team.  The patient is then prepped and draped in the usual sterile fashion.  Ultrasound survey of the right inguinal region was performed with images stored and sent to PACs, confirming patency of the common femoral artery.  11 blade scalpel was used to make a small incision. Blunt dissection was performed. A micropuncture needle was used access the right common femoral artery under ultrasound. With excellent arterial blood flow returned, an .018 micro wire was passed through the needle, observed to enter the abdominal aorta under fluoroscopy. The needle was removed, and a micropuncture sheath was placed over the wire. The inner dilator and wire were removed, and an 035 Bentson wire was advanced under fluoroscopy into the abdominal aorta. The sheath was removed and a standard 5 [place] vascular sheath was placed. The dilator was removed and the sheath was flushed.  A 81F JB-1 diagnostic catheter was advanced over the wire to the proximal descending thoracic aorta. Wire was then removed. Double flush of the catheter was performed. Catheter was then used to select the left common carotid artery.  Formal angiogram was performed of the cervical and cerebral vessels.  Glidewire was then used to navigate across the dissected proximal cervical ICA. Diagnostic catheter was then advanced into the distal cervical ICA.  Exchange length Rosen wire was then passed through the diagnostic catheter to the distal cervical ICA and the diagnostic catheter was removed. The 5 French sheath was removed and exchanged for 8 French 55 centimeter BrightTip sheath. Sheath was flushed and attached to pressurized and heparinized saline bag for constant forward flow. Then a neuron Max 80cm catheter was prepared on the back table.  Ace 68 intermediate catheter was then loaded though the Neuron Max catheter with coaxial microcatheter, and advanced through the guide catheter.   Under roadmap angiogram, Microcatheter system was then introduced through the Ace catheter using a synchro soft 014 wire and a Trevo Provue18 catheter. Microcatheter system was advanced into  the internal carotid artery, to the level of the occlusion. The micro wire and the microcatheter were carefully placed at the proximal occlusion, and the aspiration catheter was advanced to the occlusion. Microwire in the microcatheter were withdrawn, an aspiration was performed.  Aspiration was initiated, confirming return of blood flow. The catheter was then advanced into the occlusion with stasis of blood flow returned.  With no blood flow returned after 30 seconds-60 seconds of aspiration, the aspiration catheter was removed entirely from the neuron max. This catheter was then flushed, with clot retrieved.  Repeat angiogram performed. Further targets with thrombus were identified within M2 branches.  The aspiration catheter was then used as a intermediate catheter, with the microcatheter advanced with the synchro wire. Under road map angiogram, the microcatheter and microwire were advanced beyond the M2 thrombus. The wire was removed and gentle aspiration was performed confirming blood flow returned. Gentle injection was performed to prove intraluminal location.  A rotating hemostatic valve was then attached to the back end of the microcatheter, and a pressurized and heparinized saline bag was attached to the catheter.  4 x 40 solitaire device was then selected. Back flush was achieved at the rotating hemostatic valve, and then the device was gently advanced through the microcatheter to the distal end. The retriever was then unsheathed by withdrawing the microcatheter under fluoroscopy. Once the retriever was completely unsheathed, control angiogram was performed from the balloon catheter.  Constant aspiration was then performed through the Ace catheter as the retriever was gently and slowly withdrawn with  fluoroscopic observation. Once the retriever was entirely removed from the system, free aspiration was confirmed at the hub of the intermediate catheter, with free blood return confirmed.  Control angiogram was then performed. Persistent occlusion at the proximal M2 branch was confirmed.  The microcatheter system was then again advanced through the Ace catheter.  Once the micro wire microcatheter were beyond the M2 occlusion, the micro wire was removed and solitaire 4 x 40 device was deployed. After the solitaire was deployed across the occlusion, the Ace catheter was advanced into the M1 segment at the site of the occlusion.  Local aspiration was performed upon withdrawal of the solitaire device under fluoroscopic observation. Once the retrieve her was entirely removed from the system, free aspiration was confirmed at the hub of the intermediate catheter, with free blood return confirmed.  Ace aspiration catheter was removed.  Control angiogram was again performed.  035 rose in wire was then advanced through the neuron max into the cervical segment. The neuron max was withdrawn proximal to the bifurcation with repeat angiogram performed.  At this point, the results and evolution of the case as well as the patient management was thoroughly discussed with the neurology team. We elected at this time to perform flat panel CT. After the results demonstrated presence of subarachnoid/ICH, we elected not to place a carotid stent.  The neuro on max was removed.  Completion cerebral angiogram was then performed of the right carotid system and the bilateral vertebral arteries.  Catheter was removed, and the bright tip sheath was exchanged for a short 8 French sheath at the right common femoral artery.  Eight [place] Angio-Seal was deployed for hemostasis.  Patient tolerated the procedure well and remained hemodynamically stable throughout.  No complications were encountered.  IMPRESSION: Status post ultrasound  guided access right common femoral artery for cerebral angiogram and mechanical thrombectomy of left MCA ELVO, achieving TICI 2b reperfusion after 3 passes with a combination  of ADAPT technique and SOLUMBRA technique using a 4x40 solitaire.  Status post deployment of Angio-Seal for hemostasis at the access site.  Cerebral angiogram confirms acute left ICA dissection as the source of the intracranial emboli, with cervical ICA occluded at the case conclusion.  Signed,  Dulcy Fanny. Dellia Nims, RPVI  [facility]  [facility] Radiology  PLAN: Given the presence of hemorrhage, the patient will stay intubated.  Straight to CT for baseline CT scan.  Right common femoral artery has been closed with Angio-Seal. Local wound care and right hip straight for 4 hours.  The patient will be admitted to the neuro ICU.  Target blood pressure goal of less than 417 systolic   Electronically Signed   By: Corrie Mckusick D.O.   On: 02/20/2018 14:20  Ct Cerebral Perfusion W Contrast  Result Date: 02/19/2018 CLINICAL DATA:  Stroke.  Right-sided weakness aphasia EXAM: CT ANGIOGRAPHY HEAD AND NECK CT PERFUSION BRAIN TECHNIQUE: Multidetector CT imaging of the head and neck was performed using the standard protocol during bolus administration of intravenous contrast. Multiplanar CT image reconstructions and MIPs were obtained to evaluate the vascular anatomy. Carotid stenosis measurements (when applicable) are obtained utilizing NASCET criteria, using the distal internal carotid diameter as the denominator. Multiphase CT imaging of the brain was performed following IV bolus contrast injection. Subsequent parametric perfusion maps were calculated using RAPID software. CONTRAST:  31m ISOVUE-370 IOPAMIDOL (ISOVUE-370) INJECTION 76% COMPARISON:  CT head 02/19/2018 FINDINGS: CTA NECK FINDINGS Aortic arch: Normal aortic arch without atherosclerotic disease or aneurysm. Proximal great vessels normal  Right carotid system: Normal right carotid. No evidence of atherosclerotic disease dissection or stenosis Left carotid system: Irregularity and stenosis of the proximal left internal carotid artery compatible with dissection. No significant calcification. Intraluminal filling defect compatible with thrombus. Left internal carotid artery small in caliber but patent to the cavernous segment. There is occlusion of the supraclinoid internal carotid artery on the left. Vertebral arteries: Normal Skeleton: Normal.  Periapical cyst around right upper incisor Other neck: Negative for mass or adenopathy. Upper chest: Negative Review of the MIP images confirms the above findings CTA HEAD FINDINGS Anterior circulation: Slow flow with small caliber of the left cavernous carotid which then occludes in the supraclinoid component. Occlusion of the proximal left A1. Occlusion of left M1 segment. Occlusion of left M2 branches diffusely. Right middle cerebral artery widely patent. A2 segments patent bilaterally. Posterior circulation: Both vertebral arteries patent to the basilar. Left vertebral dominant. PICA patent bilaterally. Basilar patent. Superior cerebellar and posterior cerebral arteries patent bilaterally. Venous sinuses: Negative Anatomic variants: None Delayed phase: Not performed Review of the MIP images confirms the above findings CT Brain Perfusion Findings: CBF (<30%) Volume: 1361mPerfusion (Tmax>6.0s) volume: 17112mismatch Volume: 40m35mfarction Location:Left MCA territory including left basal ganglia. IMPRESSION: Large territory left MCA acute infarct. 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 Occlusion left A1 with reconstitution left A2. Occlusion left M1 and M2 segments. Call is currently in for Dr. McNeLeonides Schanzectronically Signed   By: CharFranchot Gallo.   On: 02/19/2018 12:59   Dg  Chest Port 1 View  Result Date: 02/21/2018 CLINICAL DATA:  Hypoxia EXAM: PORTABLE CHEST 1 VIEW COMPARISON:  February 20, 2018 FINDINGS: Endotracheal tube tip is 4.0 cm above the carina. Nasogastric tube tip and side port are below the diaphragm. Central catheter tip is at the  cavoatrial junction. No pneumothorax. There is atelectatic change in the lung bases. Lungs elsewhere clear. Heart is upper normal in size with pulmonary vascularity normal. No adenopathy. No bone lesions. IMPRESSION: Tube and catheter positions as described without pneumothorax. Lower lobe atelectatic change. No edema or consolidation. Stable cardiac silhouette. Electronically Signed   By: Lowella Grip III M.D.   On: 02/21/2018 07:25   Dg Chest Port 1 View  Result Date: 02/20/2018 CLINICAL DATA:  Respiratory failure,central line placement EXAM: PORTABLE CHEST 1 VIEW COMPARISON:  02/20/2018 FINDINGS: Endotracheal tube is in place, tip approximately 7.1 centimeters above the carina. Nasogastric tube is in place, tip beyond the level of the mid stomach. A LEFT IJ central line tip overlies the superior vena cava and is new since the previous exam. There is no pneumothorax. Heart size is normal. Mild subsegmental atelectasis in the MEDIAL LEFT lung base. Remote RIGHT rib fracture. IMPRESSION: Interval placement of LEFT IJ central line.  No pneumothorax. LEFT LOWER lobe atelectasis. Electronically Signed   By: Nolon Nations M.D.   On: 02/20/2018 16:50   Dg Chest Port 1 View  Result Date: 02/20/2018 CLINICAL DATA:  Endotracheal tube placement EXAM: PORTABLE CHEST 1 VIEW COMPARISON:  02/20/2018 FINDINGS: Endotracheal tube 6.5 cm above the carina unchanged from earlier today. NG tube enters the stomach with side hole in the distal esophagus similar to the prior study. Mild bibasilar atelectasis similar to the prior study. Negative for edema or effusion IMPRESSION: Endotracheal tube 6.5 cm above the carina unchanged NG tube in the distal  esophagus unchanged Mild bibasilar atelectasis unchanged Electronically Signed   By: Franchot Gallo M.D.   On: 02/20/2018 15:34   Dg Chest Port 1 View  Result Date: 02/20/2018 CLINICAL DATA:  37 year old male with respiratory failure status post left internal carotid artery dissection and left MCA territory infarct. EXAM: PORTABLE CHEST 1 VIEW COMPARISON:  Prior chest x-ray 02/19/2018 FINDINGS: The patient is intubated. The tip of the endotracheal tube is 6.8 cm above the carina. A nasogastric tube is present. The tip of the tube overlies the GE junction and is not within the stomach. Low inspiratory volumes with perhaps minimal bibasilar atelectasis. The lungs are otherwise clear. Remote healed right seventh rib fracture. IMPRESSION: 1. The tip of the nasogastric tube overlies the gastroesophageal junction and is not within the stomach. If the intends to decompress the stomach, recommend advancing 10 cm. 2. Low inspiratory volumes with minimal bibasilar atelectasis. 3. The tip of the endotracheal tube is 6.7 cm above the carina. These results will be called to the ordering clinician or representative by the Radiologist Assistant, and communication documented in the PACS or zVision Dashboard. Electronically Signed   By: Jacqulynn Cadet M.D.   On: 02/20/2018 08:55   Ir Percutaneous Art Thrombectomy/infusion Intracranial Inc Diag Angio  Result Date: 02/20/2018 INDICATION: 37 year old male with a history of acute left MCA stroke Given the patient's age and baseline function, thrombectomy was offered to the patient's family as a possibility of improving his overall function and giving him the best chance of recovery, accepting a high risk of intracranial hemorrhage/malignant edema given the size of the infarction. EXAM: ULTRASOUND GUIDED ACCESS RIGHT COMMON FEMORAL ARTERY CEREBRAL ANGIOGRAM MECHANICAL THROMBECTOMY LEFT MCA CLOSURE OF ACCESS SITE WITH ANGIO-SEAL DEVICE COMPARISON:  CT 02/19/2018, CT ANGIOGRAM  02/19/2018 MEDICATIONS: 2 g Ancef. The antibiotic was administered within 1 hour of the procedure ANESTHESIA/SEDATION: GENERAL ENDOTRACHEAL TUBE ANESTHESIA WITH THE ANESTHESIA TEAM CONTRAST:  140 cc Isovue FLUOROSCOPY  TIME:  Fluoroscopy Time: 35 minutes 6 seconds (2373 mGy). COMPLICATIONS: None TECHNIQUE: Informed written consent was obtained from the patient's family after a thorough discussion of the procedural risks, benefits and alternatives. Specific risks discussed include: Bleeding, infection, contrast reaction, kidney injury/failure, need for further procedure/surgery, arterial injury or dissection, embolization to new territory, intracranial hemorrhage (10-15% risk), neurologic deterioration, cardiopulmonary collapse, death. All questions were addressed. Maximal Sterile Barrier Technique was utilized including during the procedure including caps, mask, sterile gowns, sterile gloves, sterile drape, hand hygiene and skin antiseptic. A timeout was performed prior to the initiation of the procedure. The anesthesia team was present to provide general endotracheal tube anesthesia and for patient monitoring during the procedure. Interventional neuro radiology nursing staff was also present. FINDINGS: Ultrasound images were performed of the right inguinal region, confirming patency of the right common femoral artery. Images were stored and sent to PACs. Initial Findings: Left common carotid artery:  Normal course caliber and contour. Left external carotid artery: Patent with antegrade flow. Left internal carotid artery: There is irregular appearance of the posterior proximal internal carotid artery extending from the carotid bulb along the proximal 3rd of the right internal carotid artery. This results in at least 50 percent narrowing, with irregular blunted appearance on the distal filling defect, compatible with thrombus/dissection. The mid and distal cervical segment of the ICA is patent. Unremarkable petrous  segment, lacerum segment, cavernous segment, clinoid segment/ophthalmic segment and terminal segment. Left MCA: MCA occluded at the origin with a reversed meniscus suggesting acute thrombus. Patent anterior choroidal artery and a small posterior communicating artery, which washes out from collateral flow from the posterior. The initial injection demonstrates patent meningo-hypophyseal branch, with sellar blush. Left ACA: A 1 segment patent. A 2 segment perfuses the left territory. Patent anterior communicating artery with transient perfusion of the right ACA territory. Completion Findings: Left MCA: After mechanical thrombectomy, there is reperfusion of the middle cerebral artery, with slow flow through the insular branches, TICI 2b. Early venous drainage is visualized. Upon withdrawal into the common carotid artery, there is occlusion of the proximal cervical ICA secondary to acute dissection/thrombus. External carotid artery remains patent, with early perfusion changes of external to internal collateralization, as there is filling of the carotid siphon via ascending cervical. Right common carotid artery:  Normal course caliber and contour. Right external carotid artery: Patent with antegrade flow. Right internal carotid artery: Normal course caliber and contour of the cervical portion. Vertical and petrous segment patent with normal course caliber contour. Lacerum segment patent. cavernous segment patent. Clinoid segment patent. Antegrade flow of the ophthalmic artery. Ophthalmic segment patent. Terminus patent. Crossover flow into the left hemisphere, with perfusion of both the ACA territory and the MCA territory at the conclusion. Right MCA: M1 segment patent. Insular and opercular segments patent. Unremarkable caliber and course of the cortical segments. Typical arterial, capillary/ parenchymal, and venous phase. Right ACA: A 1 segment patent. A 2 segment perfuses the right territory. Right vertebral: Cervical  vertebral artery have normal course caliber and contour with expected filling of the C3, C2, C1 branches. The dominant runoff of the right vertebral artery is into a right posterior inferior cerebellar artery, with a small right vertebral artery to basilar contribution. The majority of flow into the basilar from the right is washed out from a left vertebral artery inflow. Left vertebral: Normal course caliber and contour of left vertebral artery. Dominant left vertebral artery. Expected filling of the C3, C2, C1 branches. Dominant vertebral artery  contributes to basilar artery, with patent left posterior inferior cerebellar artery, patent bilateral superior cerebellar artery, and patent bilateral posterior cerebral artery, with symmetric opacification from the left vertebral injection. The left posterior cerebral artery contributes to perfusion of the left temporal lobe and occipital lobe. Flat panel CT: CT demonstrates no significant mass effect, however, at the conclusion there is evidence of contrast staining of the left MCA territory gyri as well as a combination of ICH/SAH at the left basal ganglia and sylvian fissure. Evidence of involving left MCA infarction. PROCEDURE: Patient is brought emergently to the neuro angiography suite, with the patient identified appropriately and placed supine position on the table. Left radial arterial line was placed by the anesthesia team. The patient is then prepped and draped in the usual sterile fashion. Ultrasound survey of the right inguinal region was performed with images stored and sent to PACs, confirming patency of the common femoral artery. 11 blade scalpel was used to make a small incision. Blunt dissection was performed. A micropuncture needle was used access the right common femoral artery under ultrasound. With excellent arterial blood flow returned, an .018 micro wire was passed through the needle, observed to enter the abdominal aorta under fluoroscopy. The  needle was removed, and a micropuncture sheath was placed over the wire. The inner dilator and wire were removed, and an 035 Bentson wire was advanced under fluoroscopy into the abdominal aorta. The sheath was removed and a standard 5 [place] vascular sheath was placed. The dilator was removed and the sheath was flushed. A 55F JB-1 diagnostic catheter was advanced over the wire to the proximal descending thoracic aorta. Wire was then removed. Double flush of the catheter was performed. Catheter was then used to select the left common carotid artery. Formal angiogram was performed of the cervical and cerebral vessels. Glidewire was then used to navigate across the dissected proximal cervical ICA. Diagnostic catheter was then advanced into the distal cervical ICA. Exchange length Rosen wire was then passed through the diagnostic catheter to the distal cervical ICA and the diagnostic catheter was removed. The 5 French sheath was removed and exchanged for 8 French 55 centimeter BrightTip sheath. Sheath was flushed and attached to pressurized and heparinized saline bag for constant forward flow. Then a neuron Max 80cm catheter was prepared on the back table. Ace 68 intermediate catheter was then loaded though the Neuron Max catheter with coaxial microcatheter, and advanced through the guide catheter. Under roadmap angiogram, Microcatheter system was then introduced through the Ace catheter using a synchro soft 014 wire and a Trevo Provue18 catheter. Microcatheter system was advanced into the internal carotid artery, to the level of the occlusion. The micro wire and the microcatheter were carefully placed at the proximal occlusion, and the aspiration catheter was advanced to the occlusion. Microwire in the microcatheter were withdrawn, an aspiration was performed. Aspiration was initiated, confirming return of blood flow. The catheter was then advanced into the occlusion with stasis of blood flow returned. With no blood  flow returned after 30 seconds-60 seconds of aspiration, the aspiration catheter was removed entirely from the neuron max. This catheter was then flushed, with clot retrieved. Repeat angiogram performed. Further targets with thrombus were identified within M2 branches. The aspiration catheter was then used as a intermediate catheter, with the microcatheter advanced with the synchro wire. Under road map angiogram, the microcatheter and microwire were advanced beyond the M2 thrombus. The wire was removed and gentle aspiration was performed confirming blood flow returned. Gentle  injection was performed to prove intraluminal location. A rotating hemostatic valve was then attached to the back end of the microcatheter, and a pressurized and heparinized saline bag was attached to the catheter. 4 x 40 solitaire device was then selected. Back flush was achieved at the rotating hemostatic valve, and then the device was gently advanced through the microcatheter to the distal end. The retriever was then unsheathed by withdrawing the microcatheter under fluoroscopy. Once the retriever was completely unsheathed, control angiogram was performed from the balloon catheter. Constant aspiration was then performed through the Ace catheter as the retriever was gently and slowly withdrawn with fluoroscopic observation. Once the retriever was entirely removed from the system, free aspiration was confirmed at the hub of the intermediate catheter, with free blood return confirmed. Control angiogram was then performed. Persistent occlusion at the proximal M2 branch was confirmed. The microcatheter system was then again advanced through the Ace catheter. Once the micro wire microcatheter were beyond the M2 occlusion, the micro wire was removed and solitaire 4 x 40 device was deployed. After the solitaire was deployed across the occlusion, the Ace catheter was advanced into the M1 segment at the site of the occlusion. Local aspiration was  performed upon withdrawal of the solitaire device under fluoroscopic observation. Once the retrieve her was entirely removed from the system, free aspiration was confirmed at the hub of the intermediate catheter, with free blood return confirmed. Ace aspiration catheter was removed. Control angiogram was again performed. 035 rose in wire was then advanced through the neuron max into the cervical segment. The neuron max was withdrawn proximal to the bifurcation with repeat angiogram performed. At this point, the results and evolution of the case as well as the patient management was thoroughly discussed with the neurology team. We elected at this time to perform flat panel CT. After the results demonstrated presence of subarachnoid/ICH, we elected not to place a carotid stent. The neuro on max was removed. Completion cerebral angiogram was then performed of the right carotid system and the bilateral vertebral arteries. Catheter was removed, and the bright tip sheath was exchanged for a short 8 French sheath at the right common femoral artery. Eight [place] Angio-Seal was deployed for hemostasis. Patient tolerated the procedure well and remained hemodynamically stable throughout. No complications were encountered. IMPRESSION: Status post ultrasound guided access right common femoral artery for cerebral angiogram and mechanical thrombectomy of left MCA ELVO, achieving TICI 2b reperfusion after 3 passes with a combination of ADAPT technique and SOLUMBRA technique using a 4x40 solitaire. Status post deployment of Angio-Seal for hemostasis at the access site. Cerebral angiogram confirms acute left ICA dissection as the source of the intracranial emboli, with cervical ICA occluded at the case conclusion. Signed, Dulcy Fanny. Dellia Nims, RPVI [facility] Radiology PLAN: Given the presence of hemorrhage, the patient will stay intubated. Straight to CT for baseline CT scan. Right  common femoral artery has been closed with Angio-Seal. Local wound care and right hip straight for 4 hours. The patient will be admitted to the neuro ICU. Target blood pressure goal of less than 315 systolic Electronically Signed   By: Corrie Mckusick D.O.   On: 02/20/2018 14:20   Ct Head Code Stroke Wo Contrast  Result Date: 02/19/2018 CLINICAL DATA:  Code stroke.  Right-sided weakness, aphasia EXAM: CT HEAD WITHOUT CONTRAST TECHNIQUE: Contiguous axial images were obtained from the base of the skull through the vertex without intravenous contrast. COMPARISON:  None. FINDINGS:  Brain: Hypodensity left occipital parietal cortex and white matter compatible with acute infarct. Patchy hypodensity in the left frontal lobe also consistent with infarct which is probably acute. Negative for hemorrhage.  Ventricle size normal.  No mass lesion. Vascular: Negative for hyperdense vessel Skull: Negative Sinuses/Orbits: Mild mucosal edema paranasal sinuses.  Normal orbit Other: None ASPECTS ([place] Stroke Program Early CT Score) - Ganglionic level infarction (caudate, lentiform nuclei, internal capsule, insula, M1-M3 cortex): 7 - Supraganglionic infarction (M4-M6 cortex): 2 Total score (0-10 with 10 being normal): 8 IMPRESSION: 1. Hypodensities left frontal lobe and left occipital parietal lobe most compatible with acute infarct. Possible emboli or watershed infarct. Negative for hemorrhage or mass-effect 2. ASPECTS is 8 3. These results were called by telephone at the time of interpretation on 02/19/2018 at 12:29 pm to Dr. Leonel Ramsay , who verbally acknowledged these results. Electronically Signed   By: Franchot Gallo M.D.   On: 02/19/2018 12:31   Ir Angio Intra Extracran Sel Com Carotid Innominate Uni R Mod Sed  Result Date: 02/20/2018 INDICATION: 37 year old male with a history of acute left MCA stroke Given the patient's age and baseline function, thrombectomy was offered to the patient's family as a possibility of  improving his overall function and giving him the best chance of recovery, accepting a high risk of intracranial hemorrhage/malignant edema given the size of the infarction. EXAM: ULTRASOUND GUIDED ACCESS RIGHT COMMON FEMORAL ARTERY CEREBRAL ANGIOGRAM MECHANICAL THROMBECTOMY LEFT MCA CLOSURE OF ACCESS SITE WITH ANGIO-SEAL DEVICE COMPARISON:  CT 02/19/2018, CT ANGIOGRAM 02/19/2018 MEDICATIONS: 2 g Ancef. The antibiotic was administered within 1 hour of the procedure ANESTHESIA/SEDATION: GENERAL ENDOTRACHEAL TUBE ANESTHESIA WITH THE ANESTHESIA TEAM CONTRAST:  140 cc Isovue FLUOROSCOPY TIME:  Fluoroscopy Time: 35 minutes 6 seconds (2373 mGy). COMPLICATIONS: None TECHNIQUE: Informed written consent was obtained from the patient's family after a thorough discussion of the procedural risks, benefits and alternatives. Specific risks discussed include: Bleeding, infection, contrast reaction, kidney injury/failure, need for further procedure/surgery, arterial injury or dissection, embolization to new territory, intracranial hemorrhage (10-15% risk), neurologic deterioration, cardiopulmonary collapse, death. All questions were addressed. Maximal Sterile Barrier Technique was utilized including during the procedure including caps, mask, sterile gowns, sterile gloves, sterile drape, hand hygiene and skin antiseptic. A timeout was performed prior to the initiation of the procedure. The anesthesia team was present to provide general endotracheal tube anesthesia and for patient monitoring during the procedure. Interventional neuro radiology nursing staff was also present. FINDINGS: Ultrasound images were performed of the right inguinal region, confirming patency of the right common femoral artery. Images were stored and sent to PACs. Initial Findings: Left common carotid artery:  Normal course caliber and contour. Left external carotid artery: Patent with antegrade flow. Left internal carotid artery: There is irregular appearance  of the posterior proximal internal carotid artery extending from the carotid bulb along the proximal 3rd of the right internal carotid artery. This results in at least 50 percent narrowing, with irregular blunted appearance on the distal filling defect, compatible with thrombus/dissection. The mid and distal cervical segment of the ICA is patent. Unremarkable petrous segment, lacerum segment, cavernous segment, clinoid segment/ophthalmic segment and terminal segment. Left MCA: MCA occluded at the origin with a reversed meniscus suggesting acute thrombus. Patent anterior choroidal artery and a small posterior communicating artery, which washes out from collateral flow from the posterior. The initial injection demonstrates patent meningo-hypophyseal branch, with sellar blush. Left ACA: A 1 segment patent. A 2 segment perfuses the left territory. Patent anterior communicating  artery with transient perfusion of the right ACA territory. Completion Findings: Left MCA: After mechanical thrombectomy, there is reperfusion of the middle cerebral artery, with slow flow through the insular branches, TICI 2b. Early venous drainage is visualized. Upon withdrawal into the common carotid artery, there is occlusion of the proximal cervical ICA secondary to acute dissection/thrombus. External carotid artery remains patent, with early perfusion changes of external to internal collateralization, as there is filling of the carotid siphon via ascending cervical. Right common carotid artery:  Normal course caliber and contour. Right external carotid artery: Patent with antegrade flow. Right internal carotid artery: Normal course caliber and contour of the cervical portion. Vertical and petrous segment patent with normal course caliber contour. Lacerum segment patent. cavernous segment patent. Clinoid segment patent. Antegrade flow of the ophthalmic artery. Ophthalmic segment patent. Terminus patent. Crossover flow into the left  hemisphere, with perfusion of both the ACA territory and the MCA territory at the conclusion. Right MCA: M1 segment patent. Insular and opercular segments patent. Unremarkable caliber and course of the cortical segments. Typical arterial, capillary/ parenchymal, and venous phase. Right ACA: A 1 segment patent. A 2 segment perfuses the right territory. Right vertebral: Cervical vertebral artery have normal course caliber and contour with expected filling of the C3, C2, C1 branches. The dominant runoff of the right vertebral artery is into a right posterior inferior cerebellar artery, with a small right vertebral artery to basilar contribution. The majority of flow into the basilar from the right is washed out from a left vertebral artery inflow. Left vertebral: Normal course caliber and contour of left vertebral artery. Dominant left vertebral artery. Expected filling of the C3, C2, C1 branches. Dominant vertebral artery contributes to basilar artery, with patent left posterior inferior cerebellar artery, patent bilateral superior cerebellar artery, and patent bilateral posterior cerebral artery, with symmetric opacification from the left vertebral injection. The left posterior cerebral artery contributes to perfusion of the left temporal lobe and occipital lobe. Flat panel CT: CT demonstrates no significant mass effect, however, at the conclusion there is evidence of contrast staining of the left MCA territory gyri as well as a combination of ICH/SAH at the left basal ganglia and sylvian fissure. Evidence of involving left MCA infarction. PROCEDURE: Patient is brought emergently to the neuro angiography suite, with the patient identified appropriately and placed supine position on the table. Left radial arterial line was placed by the anesthesia team. The patient is then prepped and draped in the usual sterile fashion. Ultrasound survey of the right inguinal region was performed with images stored and sent to PACs,  confirming patency of the common femoral artery. 11 blade scalpel was used to make a small incision. Blunt dissection was performed. A micropuncture needle was used access the right common femoral artery under ultrasound. With excellent arterial blood flow returned, an .018 micro wire was passed through the needle, observed to enter the abdominal aorta under fluoroscopy. The needle was removed, and a micropuncture sheath was placed over the wire. The inner dilator and wire were removed, and an 035 Bentson wire was advanced under fluoroscopy into the abdominal aorta. The sheath was removed and a standard 5 [place] vascular sheath was placed. The dilator was removed and the sheath was flushed. A 39F JB-1 diagnostic catheter was advanced over the wire to the proximal descending thoracic aorta. Wire was then removed. Double flush of the catheter was performed. Catheter was then used to select the left common carotid artery. Formal angiogram was performed of  the cervical and cerebral vessels. Glidewire was then used to navigate across the dissected proximal cervical ICA. Diagnostic catheter was then advanced into the distal cervical ICA. Exchange length Rosen wire was then passed through the diagnostic catheter to the distal cervical ICA and the diagnostic catheter was removed. The 5 French sheath was removed and exchanged for 8 French 55 centimeter BrightTip sheath. Sheath was flushed and attached to pressurized and heparinized saline bag for constant forward flow. Then a neuron Max 80cm catheter was prepared on the back table. Ace 68 intermediate catheter was then loaded though the Neuron Max catheter with coaxial microcatheter, and advanced through the guide catheter. Under roadmap angiogram, Microcatheter system was then introduced through the Ace catheter using a synchro soft 014 wire and a Trevo Provue18 catheter. Microcatheter system was advanced into the internal carotid artery, to the level of the occlusion. The  micro wire and the microcatheter were carefully placed at the proximal occlusion, and the aspiration catheter was advanced to the occlusion. Microwire in the microcatheter were withdrawn, an aspiration was performed. Aspiration was initiated, confirming return of blood flow. The catheter was then advanced into the occlusion with stasis of blood flow returned. With no blood flow returned after 30 seconds-60 seconds of aspiration, the aspiration catheter was removed entirely from the neuron max. This catheter was then flushed, with clot retrieved. Repeat angiogram performed. Further targets with thrombus were identified within M2 branches. The aspiration catheter was then used as a intermediate catheter, with the microcatheter advanced with the synchro wire. Under road map angiogram, the microcatheter and microwire were advanced beyond the M2 thrombus. The wire was removed and gentle aspiration was performed confirming blood flow returned. Gentle injection was performed to prove intraluminal location. A rotating hemostatic valve was then attached to the back end of the microcatheter, and a pressurized and heparinized saline bag was attached to the catheter. 4 x 40 solitaire device was then selected. Back flush was achieved at the rotating hemostatic valve, and then the device was gently advanced through the microcatheter to the distal end. The retriever was then unsheathed by withdrawing the microcatheter under fluoroscopy. Once the retriever was completely unsheathed, control angiogram was performed from the balloon catheter. Constant aspiration was then performed through the Ace catheter as the retriever was gently and slowly withdrawn with fluoroscopic observation. Once the retriever was entirely removed from the system, free aspiration was confirmed at the hub of the intermediate catheter, with free blood return confirmed. Control angiogram was then performed. Persistent occlusion at the proximal M2 branch was  confirmed. The microcatheter system was then again advanced through the Ace catheter. Once the micro wire microcatheter were beyond the M2 occlusion, the micro wire was removed and solitaire 4 x 40 device was deployed. After the solitaire was deployed across the occlusion, the Ace catheter was advanced into the M1 segment at the site of the occlusion. Local aspiration was performed upon withdrawal of the solitaire device under fluoroscopic observation. Once the retrieve her was entirely removed from the system, free aspiration was confirmed at the hub of the intermediate catheter, with free blood return confirmed. Ace aspiration catheter was removed. Control angiogram was again performed. 035 rose in wire was then advanced through the neuron max into the cervical segment. The neuron max was withdrawn proximal to the bifurcation with repeat angiogram performed. At this point, the results and evolution of the case as well as the patient management was thoroughly discussed with the neurology team. We elected  at this time to perform flat panel CT. After the results demonstrated presence of subarachnoid/ICH, we elected not to place a carotid stent. The neuro on max was removed. Completion cerebral angiogram was then performed of the right carotid system and the bilateral vertebral arteries. Catheter was removed, and the bright tip sheath was exchanged for a short 8 French sheath at the right common femoral artery. Eight [place] Angio-Seal was deployed for hemostasis. Patient tolerated the procedure well and remained hemodynamically stable throughout. No complications were encountered. IMPRESSION: Status post ultrasound guided access right common femoral artery for cerebral angiogram and mechanical thrombectomy of left MCA ELVO, achieving TICI 2b reperfusion after 3 passes with a combination of ADAPT technique and SOLUMBRA technique using a 4x40 solitaire. Status post deployment of Angio-Seal for hemostasis at the access  site. Cerebral angiogram confirms acute left ICA dissection as the source of the intracranial emboli, with cervical ICA occluded at the case conclusion. Signed, Dulcy Fanny. Dellia Nims, RPVI [facility] Inter[facility]iology [facility] Radiology PLAN: Given the presence of hemorrhage, the patient will stay intubated. Straight to CT for baseline CT scan. Right common femoral artery has been closed with Angio-Seal. Local wound care and right hip straight for 4 hours. The patient will be admitted to the neuro ICU. Target blood pressure goal of less than 094 systolic Electronically Signed   By: Corrie Mckusick D.O.   On: 02/20/2018 14:20   Ir Angio Vertebral Sel Vertebral Bilat Mod Sed  Result Date: 02/20/2018 INDICATION: 37 year old male with a history of acute left MCA stroke Given the patient's age and baseline function, thrombectomy was offered to the patient's family as a possibility of improving his overall function and giving him the best chance of recovery, accepting a high risk of intracranial hemorrhage/malignant edema given the size of the infarction. EXAM: ULTRASOUND GUIDED ACCESS RIGHT COMMON FEMORAL ARTERY CEREBRAL ANGIOGRAM MECHANICAL THROMBECTOMY LEFT MCA CLOSURE OF ACCESS SITE WITH ANGIO-SEAL DEVICE COMPARISON:  CT 02/19/2018, CT ANGIOGRAM 02/19/2018 MEDICATIONS: 2 g Ancef. The antibiotic was administered within 1 hour of the procedure ANESTHESIA/SEDATION: GENERAL ENDOTRACHEAL TUBE ANESTHESIA WITH THE ANESTHESIA TEAM CONTRAST:  140 cc Isovue FLUOROSCOPY TIME:  Fluoroscopy Time: 35 minutes 6 seconds (2373 mGy). COMPLICATIONS: None TECHNIQUE: Informed written consent was obtained from the patient's family after a thorough discussion of the procedural risks, benefits and alternatives. Specific risks discussed include: Bleeding, infection, contrast reaction, kidney injury/failure, need for further procedure/surgery, arterial injury or dissection, embolization to new territory, intracranial  hemorrhage (10-15% risk), neurologic deterioration, cardiopulmonary collapse, death. All questions were addressed. Maximal Sterile Barrier Technique was utilized including during the procedure including caps, mask, sterile gowns, sterile gloves, sterile drape, hand hygiene and skin antiseptic. A timeout was performed prior to the initiation of the procedure. The anesthesia team was present to provide general endotracheal tube anesthesia and for patient monitoring during the procedure. Interventional neuro radiology nursing staff was also present. FINDINGS: Ultrasound images were performed of the right inguinal region, confirming patency of the right common femoral artery. Images were stored and sent to PACs. Initial Findings: Left common carotid artery:  Normal course caliber and contour. Left external carotid artery: Patent with antegrade flow. Left internal carotid artery: There is irregular appearance of the posterior proximal internal carotid artery extending from the carotid bulb along the proximal 3rd of the right internal carotid artery. This results in at least 50 percent narrowing, with irregular blunted appearance on the distal filling defect, compatible with thrombus/dissection. The mid and distal cervical  segment of the ICA is patent. Unremarkable petrous segment, lacerum segment, cavernous segment, clinoid segment/ophthalmic segment and terminal segment. Left MCA: MCA occluded at the origin with a reversed meniscus suggesting acute thrombus. Patent anterior choroidal artery and a small posterior communicating artery, which washes out from collateral flow from the posterior. The initial injection demonstrates patent meningo-hypophyseal branch, with sellar blush. Left ACA: A 1 segment patent. A 2 segment perfuses the left territory. Patent anterior communicating artery with transient perfusion of the right ACA territory. Completion Findings: Left MCA: After mechanical thrombectomy, there is reperfusion of  the middle cerebral artery, with slow flow through the insular branches, TICI 2b. Early venous drainage is visualized. Upon withdrawal into the common carotid artery, there is occlusion of the proximal cervical ICA secondary to acute dissection/thrombus. External carotid artery remains patent, with early perfusion changes of external to internal collateralization, as there is filling of the carotid siphon via ascending cervical. Right common carotid artery:  Normal course caliber and contour. Right external carotid artery: Patent with antegrade flow. Right internal carotid artery: Normal course caliber and contour of the cervical portion. Vertical and petrous segment patent with normal course caliber contour. Lacerum segment patent. cavernous segment patent. Clinoid segment patent. Antegrade flow of the ophthalmic artery. Ophthalmic segment patent. Terminus patent. Crossover flow into the left hemisphere, with perfusion of both the ACA territory and the MCA territory at the conclusion. Right MCA: M1 segment patent. Insular and opercular segments patent. Unremarkable caliber and course of the cortical segments. Typical arterial, capillary/ parenchymal, and venous phase. Right ACA: A 1 segment patent. A 2 segment perfuses the right territory. Right vertebral: Cervical vertebral artery have normal course caliber and contour with expected filling of the C3, C2, C1 branches. The dominant runoff of the right vertebral artery is into a right posterior inferior cerebellar artery, with a small right vertebral artery to basilar contribution. The majority of flow into the basilar from the right is washed out from a left vertebral artery inflow. Left vertebral: Normal course caliber and contour of left vertebral artery. Dominant left vertebral artery. Expected filling of the C3, C2, C1 branches. Dominant vertebral artery contributes to basilar artery, with patent left posterior inferior cerebellar artery, patent bilateral  superior cerebellar artery, and patent bilateral posterior cerebral artery, with symmetric opacification from the left vertebral injection. The left posterior cerebral artery contributes to perfusion of the left temporal lobe and occipital lobe. Flat panel CT: CT demonstrates no significant mass effect, however, at the conclusion there is evidence of contrast staining of the left MCA territory gyri as well as a combination of ICH/SAH at the left basal ganglia and sylvian fissure. Evidence of involving left MCA infarction. PROCEDURE: Patient is brought emergently to the neuro angiography suite, with the patient identified appropriately and placed supine position on the table. Left radial arterial line was placed by the anesthesia team. The patient is then prepped and draped in the usual sterile fashion. Ultrasound survey of the right inguinal region was performed with images stored and sent to PACs, confirming patency of the common femoral artery. 11 blade scalpel was used to make a small incision. Blunt dissection was performed. A micropuncture needle was used access the right common femoral artery under ultrasound. With excellent arterial blood flow returned, an .018 micro wire was passed through the needle, observed to enter the abdominal aorta under fluoroscopy. The needle was removed, and a micropuncture sheath was placed over the wire. The inner dilator and wire were removed,  and an 035 Bentson wire was advanced under fluoroscopy into the abdominal aorta. The sheath was removed and a standard 5 [place] vascular sheath was placed. The dilator was removed and the sheath was flushed. A 46F JB-1 diagnostic catheter was advanced over the wire to the proximal descending thoracic aorta. Wire was then removed. Double flush of the catheter was performed. Catheter was then used to select the left common carotid artery. Formal angiogram was performed of the cervical and cerebral vessels. Glidewire was then used to navigate  across the dissected proximal cervical ICA. Diagnostic catheter was then advanced into the distal cervical ICA. Exchange length Rosen wire was then passed through the diagnostic catheter to the distal cervical ICA and the diagnostic catheter was removed. The 5 French sheath was removed and exchanged for 8 French 55 centimeter BrightTip sheath. Sheath was flushed and attached to pressurized and heparinized saline bag for constant forward flow. Then a neuron Max 80cm catheter was prepared on the back table. Ace 68 intermediate catheter was then loaded though the Neuron Max catheter with coaxial microcatheter, and advanced through the guide catheter. Under roadmap angiogram, Microcatheter system was then introduced through the Ace catheter using a synchro soft 014 wire and a Trevo Provue18 catheter. Microcatheter system was advanced into the internal carotid artery, to the level of the occlusion. The micro wire and the microcatheter were carefully placed at the proximal occlusion, and the aspiration catheter was advanced to the occlusion. Microwire in the microcatheter were withdrawn, an aspiration was performed. Aspiration was initiated, confirming return of blood flow. The catheter was then advanced into the occlusion with stasis of blood flow returned. With no blood flow returned after 30 seconds-60 seconds of aspiration, the aspiration catheter was removed entirely from the neuron max. This catheter was then flushed, with clot retrieved. Repeat angiogram performed. Further targets with thrombus were identified within M2 branches. The aspiration catheter was then used as a intermediate catheter, with the microcatheter advanced with the synchro wire. Under road map angiogram, the microcatheter and microwire were advanced beyond the M2 thrombus. The wire was removed and gentle aspiration was performed confirming blood flow returned. Gentle injection was performed to prove intraluminal location. A rotating hemostatic  valve was then attached to the back end of the microcatheter, and a pressurized and heparinized saline bag was attached to the catheter. 4 x 40 solitaire device was then selected. Back flush was achieved at the rotating hemostatic valve, and then the device was gently advanced through the microcatheter to the distal end. The retriever was then unsheathed by withdrawing the microcatheter under fluoroscopy. Once the retriever was completely unsheathed, control angiogram was performed from the balloon catheter. Constant aspiration was then performed through the Ace catheter as the retriever was gently and slowly withdrawn with fluoroscopic observation. Once the retriever was entirely removed from the system, free aspiration was confirmed at the hub of the intermediate catheter, with free blood return confirmed. Control angiogram was then performed. Persistent occlusion at the proximal M2 branch was confirmed. The microcatheter system was then again advanced through the Ace catheter. Once the micro wire microcatheter were beyond the M2 occlusion, the micro wire was removed and solitaire 4 x 40 device was deployed. After the solitaire was deployed across the occlusion, the Ace catheter was advanced into the M1 segment at the site of the occlusion. Local aspiration was performed upon withdrawal of the solitaire device under fluoroscopic observation. Once the retrieve her was entirely removed from the system, free  aspiration was confirmed at the hub of the intermediate catheter, with free blood return confirmed. Ace aspiration catheter was removed. Control angiogram was again performed. 035 rose in wire was then advanced through the neuron max into the cervical segment. The neuron max was withdrawn proximal to the bifurcation with repeat angiogram performed. At this point, the results and evolution of the case as well as the patient management was thoroughly discussed with the neurology team. We elected at this time to  perform flat panel CT. After the results demonstrated presence of subarachnoid/ICH, we elected not to place a carotid stent. The neuro on max was removed. Completion cerebral angiogram was then performed of the right carotid system and the bilateral vertebral arteries. Catheter was removed, and the bright tip sheath was exchanged for a short 8 French sheath at the right common femoral artery. Eight [place] Angio-Seal was deployed for hemostasis. Patient tolerated the procedure well and remained hemodynamically stable throughout. No complications were encountered. IMPRESSION: Status post ultrasound guided access right common femoral artery for cerebral angiogram and mechanical thrombectomy of left MCA ELVO, achieving TICI 2b reperfusion after 3 passes with a combination of ADAPT technique and SOLUMBRA technique using a 4x40 solitaire. Status post deployment of Angio-Seal for hemostasis at the access site. Cerebral angiogram confirms acute left ICA dissection as the source of the intracranial emboli, with cervical ICA occluded at the case conclusion. Signed, Dulcy Fanny. Dellia Nims, RPVI Vascular and Interventional Radiology Specialists [facility] Radiology PLAN: Given the presence of hemorrhage, the patient will stay intubated. Straight to CT for baseline CT scan. Right common femoral artery has been closed with Angio-Seal. Local wound care and right hip straight for 4 hours. The patient will be admitted to the neuro ICU. Target blood pressure goal of less than 683 systolic Electronically Signed   By: Corrie Mckusick D.O.   On: 02/20/2018 14:20    Assessment and Plan:   1.  Acute left MCA infarct with increased cerebral edema with 4 mm midline shift: -Patient presented to [facility] after being found at his home on the floor by his significant other on 02/19/2018 after several recent "falls". He has had recent complaints of upper extremity dysfunction (unable to write his name) and LE swelling. He also had complaints to his  father about a recent migraine. Code stroke was initiated. Given his head CT findings, patient was taken to IR for possible intervention. Per IR note, there was initial concern for reperfusion malignant edema/hemorrhage.  The decision was then made to proceed with mechanical thrombectomy on 02/19/2018 though this was complicated by intracranial hemorrhage and was deemed unsuccessful.  -Neurosurgery consulted on 02/21/2018 with recommendations for decompressive hemicraniotomy and duraplasty scheduled for today, 02/21/2018.  2.  Aortic valve vegetation/mass: -Echocardiogram performed 02/20/2018 with concern for AV vegetation versus aortic valve mass -Patient without endocarditis symptoms, afebrile, WBC 15.4>19.5>14.2 -Blood cultures sent per primary team -Will need TEE, however given patient's current status and scheduled surgery (02/21/18) will defer to a more appropriate time when okay with neurosurgery.  3.  Tobacco abuse: -Current every day smoker -Smoking cessation will be provided once extubated   For questions or updates, please contact [place] Please consult [email or web address] for contact info under Cardiology/STEMI.   Lyndel Safe NP-C [facility] Pager: [phone] 02/21/2018 2:17 PM   Patient examined chart reviewed. Discussed care with Dr Saintclair Halsted and PA. Patient getting ready to go for decompressive craniotomy. Exam with flaccid right side moves LUE. Intubated no aortic valve murmur lungs clear  anteriorly Reviewed his echo and does have what appears to be a vegetation on ventricular side of AV. Not causing any hemodynamic issues in regard to AR/AS. SBE may be related to soft tissue infection of lip weeks ago.  Rx with antibiotics for 6 weeks per ID. Reviewed head CT and has 4 mm of midline shift. No cardiac contraindications to surgery now. Will continue to have brain edema next 3-4 days with rise in intracranial pressure so hopefully procedure will help decrease the risk of  nuchal herniation. No immediate cardiac w/u indicated Will need TEE latter when stable as initial w/u   Jenkins Rouge

## 2018-02-21 NOTE — Progress Notes (Addendum)
STROKE TEAM PROGRESS NOTE   SUBJECTIVE (INTERVAL HISTORY) His dad and girlfriend are at the bedside.  Pt still intubated. Off propofol for one hour also, not able to open eyes but able to follow some simple command on the left. However, his left pupil is bigger than right but still reactive to light briskly. Will consult NSG for hemicrani. TTE also showed AV mass vs. Vegetation, will call cardiology for TEE and management.    OBJECTIVE Temp:  [97.2 F (36.2 C)-100.6 F (38.1 C)] 97.2 F (36.2 C) (07/15 0800) Pulse Rate:  [53-98] 55 (07/15 0815) Cardiac Rhythm: Normal sinus rhythm (07/15 0400) Resp:  [13-24] 16 (07/15 0815) BP: (112-147)/(60-102) 129/76 (07/15 0815) SpO2:  [100 %] 100 % (07/15 0815) Arterial Line BP: (131-172)/(59-87) 155/71 (07/15 0400) FiO2 (%):  [40 %] 40 % (07/15 0815) Weight:  [189 lb 9.5 oz (86 kg)] 189 lb 9.5 oz (86 kg) (07/15 0500)  CBC:  Recent Labs  Lab 02/19/18 1215  02/20/18 0547 02/21/18 0539  WBC 15.4*  --  19.5* 14.2*  NEUTROABS 11.8*  --  17.4*  --   HGB 18.0*   < > 15.2 15.1  HCT 48.7   < > 43.0 44.2  MCV 105.6*  --  109.1* 112.5*  PLT 265  --  174 157   < > = values in this interval not displayed.    Basic Metabolic Panel:  Recent Labs  Lab 02/20/18 0547  02/20/18 1656 02/20/18 2302 02/21/18 0539  NA 142   < > 140 145 150*  K 4.0  --   --   --  3.4*  CL 110  --   --   --  118*  CO2 23  --   --   --  25  GLUCOSE 133*  --   --   --  120*  BUN 5*  --   --   --  12  CREATININE 0.71  --   --   --  0.70  CALCIUM 7.9*  --   --   --  8.0*  MG  --    < > 1.7  --  1.8  PHOS  --    < > 3.3  --  2.6   < > = values in this interval not displayed.    Lipid Panel:     Component Value Date/Time   CHOL 101 02/20/2018 0547   TRIG 103 02/20/2018 0547   HDL 34 (L) 02/20/2018 0547   CHOLHDL 3.0 02/20/2018 0547   VLDL 21 02/20/2018 0547   LDLCALC 46 02/20/2018 0547   HgbA1c:  Lab Results  Component Value Date   HGBA1C 4.9 02/20/2018    Urine Drug Screen:     Component Value Date/Time   LABOPIA NONE DETECTED 02/19/2018 1655   COCAINSCRNUR NONE DETECTED 02/19/2018 1655   LABBENZ NONE DETECTED 02/19/2018 1655   AMPHETMU NONE DETECTED 02/19/2018 1655   THCU NONE DETECTED 02/19/2018 1655   LABBARB (A) 02/19/2018 1655    Result not available. Reagent lot number recalled by manufacturer.    Alcohol Level No results found for: [facility]  IMAGING I have personally reviewed the radiological images below and agree with the radiology interpretations.  Ct Angio Head W Or Wo Contrast Ct Angio Neck W Or Wo Contrast Ct Cerebral Perfusion W Contrast 02/19/2018 IMPRESSION:  Large territory left MCA acute infarct. Acute dissection left internal carotid artery with intraluminal thrombus and slow flow in the left internal carotid artery which  occludes in the supraclinoid segment. Occlusion left A1 with reconstitution left A2. Occlusion left M1 and M2 segments.   IR Angiogram Dr Earleen Newport 02/19/2018 3:08 PM Findings:  Left proximal cervical ICA dissection/thrombus Left M1 occlusion First pass, TICI 2a, with emboli in distal M1/M2 Second pass, TICI 2a, with persistent occlusion of M2 branch Third pass, TICI 2b Occlusive left ICA dissection, with patent ACA and maintained cross-flow from right to left Posterior circulation perfuses the left temporal and occipital region Flat panel CT shows SAH/ICH Complications: ICH  Ct Head Wo Contrast 02/19/2018 IMPRESSION:  Left MCA territory infarction with early low-density and swelling but no mass effect or shift at this time. I think most the hyperdensity seen previously relates to contrast staining, much of which is diminishing or resolved. There is some persistent contrast staining in the region of the insula and I could not exclude that there is a small amount of admixed hemorrhage.   Ct Head Wo Contrast 02/19/2018 IMPRESSION:  Large territory acute infarct left MCA territory. Interval  development of multiple areas of patchy hemorrhage within the infarct following clot retrieval. No midline shift.   Ct Head Code Stroke Wo Contrast 02/19/2018 IMPRESSION:  1. Hypodensities left frontal lobe and left occipital parietal lobe most compatible with acute infarct. Possible emboli or watershed infarct. Negative for hemorrhage or mass-effect  2. ASPECTS is 8   MRI / MRA Acute infarct of the entire left MCA territory with mild associated hemorrhage in the left parietal and frontal lobe. Diffuse edema. 2 mm midline shift to the right. No other infarct Occlusion of the left internal carotid artery and entire left middle cerebral artery  Transthoracic Echocardiogram - Left ventricle: The cavity size was normal. Wall thickness was   increased in a pattern of mild LVH. Systolic function was normal.   The estimated ejection fraction was in the range of 55% to 60%.   Wall motion was normal; there were no regional wall motion   abnormalities. Left ventricular diastolic function parameters   were normal. - Aortic valve: There was a small, mobile vegetation on the left   ventricular aspect. Impressions: - There is a small - medium sized mobile mass on the LV aspect of   the aortic valve.   The appearence is c/w vegetation.   Aortic valve endocarditis  VAS [place] LOWER EXTREMITY VENOUS (DVT) There is no DVT or SVT noted in the bilateral lower extremities.   CUS  R: no ICA stenosis. The left ICA appears occluded.  Right vertebral artery flow is antegrade.  Left vertebral artery not insonated secondary to patient's positioning on vent.  Ct Head Wo Contrast 02/21/2018 CLINICAL DATA:  Stroke follow-up EXAM: CT HEAD WITHOUT CONTRAST TECHNIQUE: Contiguous axial images were obtained from the base of the skull through the vertex without intravenous contrast. COMPARISON:  Head CT 02/19/2018 FINDINGS: Brain: There is increased hypodensity throughout the left MCA distribution in the location of the  known infarct. Hyperdense material within the infarcted region has also decreased in density, with no increased volume. Mass effect on the left lateral ventricle is increased and there is now rightward midline shift measuring 4 mm at the level of the foramina of [place]. No hydrocephalus. Right hemisphere and cerebellum are normal. Vascular: Hyperattenuation of the left middle cerebral artery. Skull: The visualized skull base, calvarium and extracranial soft tissues are normal. Sinuses/Orbits: No fluid levels or advanced mucosal thickening of the visualized paranasal sinuses. No mastoid or middle ear effusion. The orbits are normal.  IMPRESSION: 1. Progression of cytotoxic edema throughout the left MCA distribution, causing slight mass effect on the left lateral ventricle with 4 mm of rightward midline shift. No hydrocephalus. 2. Decreased density in unchanged distribution of hyperattenuating material in the left MCA territory, favored to indicate resolving contrast staining. Electronically Signed   By: Ulyses Jarred M.D.   On: 02/21/2018 05:15    PHYSICAL EXAM Temp:  [97.2 F (36.2 C)-100.6 F (38.1 C)] 97.2 F (36.2 C) (07/15 0800) Pulse Rate:  [53-98] 55 (07/15 0815) Resp:  [13-24] 16 (07/15 0815) BP: (112-147)/(60-102) 129/76 (07/15 0815) SpO2:  [100 %] 100 % (07/15 0815) Arterial Line BP: (131-172)/(59-87) 155/71 (07/15 0400) FiO2 (%):  [40 %] 40 % (07/15 0815) Weight:  [189 lb 9.5 oz (86 kg)] 189 lb 9.5 oz (86 kg) (07/15 0500)  General - Well nourished, well developed, intubated off sedation.  Ophthalmologic - fundi not visualized due to noncooperation.  Cardiovascular - Regular rate and rhythm.  Neuro - intubated just off sedation. He was not able to open eyes on voice, but obviously trying. Able to follow simple commands on the left hand with close and open, left leg holding up, but not showing fingers or thumb. With forced eye open, eye mid position, brisk pupillary reflex bilaterally,  however, anisocoria, L>R, positive gag and corneal. Not blinking to visual threat. Facial symmetry not able to evaluate due to ET tube. LUE and LLE spontaneous movement against gravity, and able to holding up in air as requested. RUE 0/5 and REL 1/5 on pain stimulation. DTR 1+ and right babinski positive. Sensation, coordination and gait not tested.   ASSESSMENT/PLAN Mr. Asim Gersten is a 36 y.o. male with no significant past medical history other than tobacco use presenting with right-sided weakness and aphasia. He did not receive IV t-PA due to late presentation.  Attempted thrombectomy with subsequent left ICA dissection and possible ICH.  Stroke: large left MCA territory infarct due to left ICA and MCA occlusion, etiology unclear, could be due to ICA dissection or aortic valve vegetation shown on TTE   Resultant  Intubated, right hemiplegia, aphasia  CT head - Large territory acute infarct left MCA territory.  IR - occlusive left ICA but TICI2b left MCA  MRI head - large left MCA infarct with small ICH/SAH hemorrhagic conversion  MRA head - left ICA and MCA remain occluded  CTA H&N - Acute dissection left internal carotid artery with intraluminal thrombus  LE Venous Dopplers - no DVT  Carotid Doppler - left ICA occluded  2D Echo - EF 55-60% and aortic valve mass vs. vegetation  Need to consider TEE - will have cardiology consult  Hypercoagulable work up pending  LDL - 46  HgbA1c - 4.9  VTE prophylaxis - subq heparin  No antithrombotic prior to admission, now on ASA.   Ongoing aggressive stroke risk factor management  Therapy recommendations:  pending  Disposition:  Pending  Cerebral edema  MRA large left MCA infarct with 62mm midline shift 02/20/18  Repeat CT showed mass effect with 58mm midline shift 02/21/18  Anisocoria today but pupillary reflex present  On 3% saline  Na goal 150-160  Na Q6h  Mannitol and 23.4% saline x 1  Emergent NSG consult  for [facility]  Aortic valve mass vs. vegetation  TTE concerning for AV vegetation vs. mass  Blood culture pending  WBC 15.4->19.5->14.2  Tmax 100.6->97.2  Will need TEE - will have cardiology consult  Left ICA dissection ??  Girlfriend  stated that he bumped his head to window 3 weeks ago  Left ICA occlusion   Tobacco abuse  Current smoker  Smoking cessation counseling will be provided   Other Stroke Risk Factors  ETOH use, advised to drink no more than 1 alcoholic beverage per day.  Family hx of stroke (grandparents)  Other Active Problems  Low FA level - 1.5 - on FA supplement  Low normal B12 level - 224 -supplementation ordered  Hospital day # 2  This patient is critically ill due to large left MCA infarct with hemorrhagic conversion, endocarditis, smoker and at significant risk of neurological worsening, death form recurrent stroke, hemorrhage, heart failure, seizure, brain herniation. This patient's care requires constant monitoring of vital signs, hemodynamics, respiratory and cardiac monitoring, review of multiple databases, neurological assessment, discussion with family, other specialists and medical decision making of high complexity. I spent 50 minutes of neurocritical care time in the care of this patient. I had long discussion with dad and girlfriend at bedside, updated pt current condition, treatment plan and potential prognosis. They expressed understanding and appreciation. I also discussed with Dr. Pearline Cables from [place].   Rosalin Hawking, MD PhD Stroke Neurology 02/21/2018 9:45 AM  To contact Stroke Continuity provider, please refer to [email or web address] After hours, contact General Neurology

## 2018-02-21 NOTE — Progress Notes (Signed)
Referring Physician(s): CODE STROKE  Supervising Physician: Corrie Mckusick  Patient Status:  [facility] - In-pt  Chief Complaint: None  Subjective:  Left MCA ELVO s/p thrombectomy 02/19/2018 with Dr. Earleen Newport. Patient laying in bed intubated and sedated. Accompanied by father and girlfriend at bedside. No spontaneous movement of extremities with sedation on. PEERL sluggish bilaterally. Right groin incision c/d/i.  CT head 02/21/2018: 1. Progression of cytotoxic edema throughout the left MCA distribution, causing slight mass effect on the left lateral ventricle with 4 mm of rightward midline shift. No hydrocephalus. 2. Decreased density in unchanged distribution of hyperattenuating material in the left MCA territory, favored to indicate resolving contrast staining.  Allergies: Patient has no known allergies.  Medications: Prior to Admission medications   Not on File     Vital Signs: BP 125/79   Pulse (!) 59   Temp 98 F (36.7 C) (Axillary)   Resp 16   Ht 6' (1.829 m)   Wt 189 lb 9.5 oz (86 kg)   SpO2 100%   BMI 25.71 kg/m   Physical Exam  Constitutional: He appears well-developed and well-nourished. No distress.  Intubated and sedated.  Cardiovascular: Normal rate, regular rhythm and normal heart sounds.  No murmur heard. Pulmonary/Chest: Effort normal and breath sounds normal. No respiratory distress. He has no wheezes.  Intubated and sedated.  Neurological:  Intubated and sedated. Speech and comprehension not assessed. PERRL sluggish bilaterally. EOMs not assessed. Visual fields not assessed. Facial asymmetry not assessed. Tongue protrusion not assessed. No spontaneous movement of extremities but positive Babinski in right foot with sedation on. Pronator drift not assessed. Fine motor and coordination not assessed. Gait not assessed. Romberg not assessed. Heel to toe not assessed. Distal pulses palpable with doppler bilaterally.  Skin: Skin is warm and dry.    Right groin incision soft without active bleeding or hematoma.  Psychiatric:  Intubated and sedated.    Imaging: Ct Angio Head W Or Wo Contrast  Result Date: 02/19/2018 CLINICAL DATA:  Stroke.  Right-sided weakness aphasia EXAM: CT ANGIOGRAPHY HEAD AND NECK CT PERFUSION BRAIN TECHNIQUE: Multidetector CT imaging of the head and neck was performed using the standard protocol during bolus administration of intravenous contrast. Multiplanar CT image reconstructions and MIPs were obtained to evaluate the vascular anatomy. Carotid stenosis measurements (when applicable) are obtained utilizing NASCET criteria, using the distal internal carotid diameter as the denominator. Multiphase CT imaging of the brain was performed following IV bolus contrast injection. Subsequent parametric perfusion maps were calculated using RAPID software. CONTRAST:  41mL ISOVUE-370 IOPAMIDOL (ISOVUE-370) INJECTION 76% COMPARISON:  CT head 02/19/2018 FINDINGS: CTA NECK FINDINGS Aortic arch: Normal aortic arch without atherosclerotic disease or aneurysm. Proximal great vessels normal Right carotid system: Normal right carotid. No evidence of atherosclerotic disease dissection or stenosis Left carotid system: Irregularity and stenosis of the proximal left internal carotid artery compatible with dissection. No significant calcification. Intraluminal filling defect compatible with thrombus. Left internal carotid artery small in caliber but patent to the cavernous segment. There is occlusion of the supraclinoid internal carotid artery on the left. Vertebral arteries: Normal Skeleton: Normal.  Periapical cyst around right upper incisor Other neck: Negative for mass or adenopathy. Upper chest: Negative Review of the MIP images confirms the above findings CTA HEAD FINDINGS Anterior circulation: Slow flow with small caliber of the left cavernous carotid which then occludes in the supraclinoid component. Occlusion of the proximal left A1.  Occlusion of left M1 segment. Occlusion of left M2 branches diffusely. Right  middle cerebral artery widely patent. A2 segments patent bilaterally. Posterior circulation: Both vertebral arteries patent to the basilar. Left vertebral dominant. PICA patent bilaterally. Basilar patent. Superior cerebellar and posterior cerebral arteries patent bilaterally. Venous sinuses: Negative Anatomic variants: None Delayed phase: Not performed Review of the MIP images confirms the above findings CT Brain Perfusion Findings: CBF (<30%) Volume: 175mL Perfusion (Tmax>6.0s) volume: 160mL Mismatch Volume: 85mL Infarction Location:Left MCA territory including left basal ganglia. IMPRESSION: Large territory left MCA acute infarct. 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 Occlusion left A1 with reconstitution left A2. Occlusion left M1 and M2 segments. Call is currently in for Dr. Leonides Schanz. Electronically Signed   By: Franchot Gallo M.D.   On: 02/19/2018 12:59   Dg Chest 1 View  Result Date: 02/19/2018 CLINICAL DATA:  37 year old male with history of stroke. Post procedure chest x-ray. EXAM: CHEST  1 VIEW COMPARISON:  No priors. FINDINGS: An endotracheal tube is in place with tip 3.1 cm above the carina. Nasogastric tube extends into the proximal stomach. Ill-defined opacity in the right lung base which is coincident with the in divisum tubing, potentially artifactual. Lung volumes are low. No other definite consolidative airspace disease. No pleural effusions. No evidence of pulmonary edema. Heart size is normal. Upper mediastinal contours are within normal limits. IMPRESSION: 1. Support apparatus, as above. 2. Low lung volumes without definite radiographic evidence of acute cardiopulmonary disease. Electronically Signed   By: Vinnie Langton M.D.   On: 02/19/2018 16:24   Ct Head Wo Contrast  Result Date:  02/21/2018 CLINICAL DATA:  Stroke follow-up EXAM: CT HEAD WITHOUT CONTRAST TECHNIQUE: Contiguous axial images were obtained from the base of the skull through the vertex without intravenous contrast. COMPARISON:  Head CT 02/19/2018 FINDINGS: Brain: There is increased hypodensity throughout the left MCA distribution in the location of the known infarct. Hyperdense material within the infarcted region has also decreased in density, with no increased volume. Mass effect on the left lateral ventricle is increased and there is now rightward midline shift measuring 4 mm at the level of the foramina of [place]. No hydrocephalus. Right hemisphere and cerebellum are normal. Vascular: Hyperattenuation of the left middle cerebral artery. Skull: The visualized skull base, calvarium and extracranial soft tissues are normal. Sinuses/Orbits: No fluid levels or advanced mucosal thickening of the visualized paranasal sinuses. No mastoid or middle ear effusion. The orbits are normal. IMPRESSION: 1. Progression of cytotoxic edema throughout the left MCA distribution, causing slight mass effect on the left lateral ventricle with 4 mm of rightward midline shift. No hydrocephalus. 2. Decreased density in unchanged distribution of hyperattenuating material in the left MCA territory, favored to indicate resolving contrast staining. Electronically Signed   By: Ulyses Jarred M.D.   On: 02/21/2018 05:15   Ct Head Wo Contrast  Result Date: 02/19/2018 CLINICAL DATA:  Followup revascularization attempt. Left MCA territory infarction secondary to acute dissection of the left internal carotid artery. EXAM: CT HEAD WITHOUT CONTRAST TECHNIQUE: Contiguous axial images were obtained from the base of the skull through the vertex without intravenous contrast. COMPARISON:  Multiple examinations earlier today. FINDINGS: Brain: Developing left MCA territory infarction. Contrast staining possibly admixed with hemorrhage in the insular region. Diminished  contrast staining elsewhere in the region of the infarction. Early swelling but no mass effect or shift at this time. No hydrocephalus. No extra-axial collection. Vascular: Some vascular contrast still evident. Skull:  Normal Sinuses/Orbits: Clear/normal Other: None IMPRESSION: Left MCA territory infarction with early low-density and swelling but no mass effect or shift at this time. I think most the hyperdensity seen previously relates to contrast staining, much of which is diminishing or resolved. There is some persistent contrast staining in the region of the insula and I could not exclude that there is a small amount of admixed hemorrhage. Electronically Signed   By: Nelson Chimes M.D.   On: 02/19/2018 20:33   Ct Head Wo Contrast  Result Date: 02/19/2018 CLINICAL DATA:  Stroke post intervention. EXAM: CT HEAD WITHOUT CONTRAST TECHNIQUE: Contiguous axial images were obtained from the base of the skull through the vertex without intravenous contrast. COMPARISON:  CT head 02/19/2018 FINDINGS: Brain: Large territory left MCA infarct has become more evident on CT with diffuse low-density throughout the left MCA territory including the basal ganglia. Interval development of patchy areas of acute hemorrhage within the infarct involving the left frontal, left parietal, and left insular region. Chronic hemorrhage also in the left lateral basal ganglia. High density in the left posterior insula likely represents extravasation of contrast. Mild local mass-effect without midline shift. Ventricle size remains normal. No other acute infarct. Vascular: Diffuse arterial enhancement due to prior angiography in CTA Skull: Negative Sinuses/Orbits: Mild mucosal edema paranasal sinuses. Negative orbit Other: None IMPRESSION: Large territory acute infarct left MCA territory. Interval development of multiple areas of patchy hemorrhage within the infarct following clot retrieval. No midline shift. The images were reviewed with Dr.  Earleen Newport. Electronically Signed   By: Franchot Gallo M.D.   On: 02/19/2018 16:18   Ct Angio Neck W Or Wo Contrast  Result Date: 02/19/2018 CLINICAL DATA:  Stroke.  Right-sided weakness aphasia EXAM: CT ANGIOGRAPHY HEAD AND NECK CT PERFUSION BRAIN TECHNIQUE: Multidetector CT imaging of the head and neck was performed using the standard protocol during bolus administration of intravenous contrast. Multiplanar CT image reconstructions and MIPs were obtained to evaluate the vascular anatomy. Carotid stenosis measurements (when applicable) are obtained utilizing NASCET criteria, using the distal internal carotid diameter as the denominator. Multiphase CT imaging of the brain was performed following IV bolus contrast injection. Subsequent parametric perfusion maps were calculated using RAPID software. CONTRAST:  38mL ISOVUE-370 IOPAMIDOL (ISOVUE-370) INJECTION 76% COMPARISON:  CT head 02/19/2018 FINDINGS: CTA NECK FINDINGS Aortic arch: Normal aortic arch without atherosclerotic disease or aneurysm. Proximal great vessels normal Right carotid system: Normal right carotid. No evidence of atherosclerotic disease dissection or stenosis Left carotid system: Irregularity and stenosis of the proximal left internal carotid artery compatible with dissection. No significant calcification. Intraluminal filling defect compatible with thrombus. Left internal carotid artery small in caliber but patent to the cavernous segment. There is occlusion of the supraclinoid internal carotid artery on the left. Vertebral arteries: Normal Skeleton: Normal.  Periapical cyst around right upper incisor Other neck: Negative for mass or adenopathy. Upper chest: Negative Review of the MIP images confirms the above findings CTA HEAD FINDINGS Anterior circulation: Slow flow with small caliber of the left cavernous carotid which then occludes in the supraclinoid component. Occlusion of the proximal left A1. Occlusion of left M1 segment. Occlusion of  left M2 branches diffusely. Right middle cerebral artery widely patent. A2 segments patent bilaterally. Posterior circulation: Both vertebral arteries patent to the basilar. Left vertebral dominant. PICA patent bilaterally. Basilar patent. Superior cerebellar and posterior cerebral arteries patent bilaterally. Venous sinuses: Negative Anatomic variants: None Delayed phase: Not performed Review of the MIP images confirms the above  findings CT Brain Perfusion Findings: CBF (<30%) Volume: 179mL Perfusion (Tmax>6.0s) volume: 165mL Mismatch Volume: 33mL Infarction Location:Left MCA territory including left basal ganglia. IMPRESSION: Large territory left MCA acute infarct. 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 Occlusion left A1 with reconstitution left A2. Occlusion left M1 and M2 segments. Call is currently in for Dr. Leonides Schanz. Electronically Signed   By: Franchot Gallo M.D.   On: 02/19/2018 12:59   Mr Jodene Nam Head Wo Contrast  Result Date: 02/20/2018 CLINICAL DATA:  Stroke post left MCA clot retrieval. Left carotid dissection EXAM: MRI HEAD WITHOUT CONTRAST MRA HEAD WITHOUT CONTRAST TECHNIQUE: Multiplanar, multiecho pulse sequences of the brain and surrounding structures were obtained without intravenous contrast. Angiographic images of the head were obtained using MRA technique without contrast. COMPARISON:  CT head 02/19/2018 FINDINGS: MRI HEAD FINDINGS Brain: Large territory acute infarct left MCA territory. Acute infarct throughout the left basal ganglia and entire left MCA territory. Mild amount of hemorrhage in the left frontal and parietal lobe. Diffuse edema with mass-effect and 2 mm midline shift to the right. No other areas of infarct hemorrhage or mass. Vascular: Thrombosed left internal carotid artery. Normal flow void right internal carotid artery and posterior circulation Skull and  upper cervical spine: Negative Sinuses/Orbits: Negative Other: None MRA HEAD FINDINGS Left internal carotid artery is occluded through the cervical and cavernous segment. Left middle cerebral artery is occluded including the left M1 M2 and M3 segments. Left anterior cerebral artery is patent presumably supplied from the right. Right internal carotid artery widely patent. Right anterior and middle cerebral arteries normal. Both vertebral arteries patent to the basilar. PICA patent bilaterally. Basilar widely patent. Superior cerebellar and posterior cerebral arteries normal bilaterally Negative for cerebral aneurysm IMPRESSION: Acute infarct of the entire left MCA territory with mild associated hemorrhage in the left parietal and frontal lobe. Diffuse edema. 2 mm midline shift to the right. No other infarct Occlusion of the left internal carotid artery and entire left middle cerebral artery Electronically Signed   By: Franchot Gallo M.D.   On: 02/20/2018 10:52   Mr Brain Wo Contrast  Result Date: 02/20/2018 CLINICAL DATA:  Stroke post left MCA clot retrieval. Left carotid dissection EXAM: MRI HEAD WITHOUT CONTRAST MRA HEAD WITHOUT CONTRAST TECHNIQUE: Multiplanar, multiecho pulse sequences of the brain and surrounding structures were obtained without intravenous contrast. Angiographic images of the head were obtained using MRA technique without contrast. COMPARISON:  CT head 02/19/2018 FINDINGS: MRI HEAD FINDINGS Brain: Large territory acute infarct left MCA territory. Acute infarct throughout the left basal ganglia and entire left MCA territory. Mild amount of hemorrhage in the left frontal and parietal lobe. Diffuse edema with mass-effect and 2 mm midline shift to the right. No other areas of infarct hemorrhage or mass. Vascular: Thrombosed left internal carotid artery. Normal flow void right internal carotid artery and posterior circulation Skull and upper cervical spine: Negative Sinuses/Orbits: Negative  Other: None MRA HEAD FINDINGS Left internal carotid artery is occluded through the cervical and cavernous segment. Left middle cerebral artery is occluded including the left M1 M2 and M3 segments. Left anterior cerebral artery is patent presumably supplied from the right. Right internal carotid artery widely patent. Right anterior and middle cerebral arteries normal. Both vertebral arteries patent to the basilar. PICA patent bilaterally. Basilar widely patent. Superior cerebellar and posterior cerebral arteries normal bilaterally Negative for cerebral aneurysm IMPRESSION: Acute infarct  of the entire left MCA territory with mild associated hemorrhage in the left parietal and frontal lobe. Diffuse edema. 2 mm midline shift to the right. No other infarct Occlusion of the left internal carotid artery and entire left middle cerebral artery Electronically Signed   By: Franchot Gallo M.D.   On: 02/20/2018 10:52   [place]  Result Date: 02/20/2018 INDICATION: 37 year old male with a history of acute left MCA stroke Given the patient's age and baseline function, thrombectomy was offered to the patient's family as a possibility of improving his overall function and giving him the best chance of recovery, accepting a high risk of intracranial hemorrhage/malignant edema given the size of the infarction. EXAM: ULTRASOUND GUIDED ACCESS RIGHT COMMON FEMORAL ARTERY CEREBRAL ANGIOGRAM MECHANICAL THROMBECTOMY LEFT MCA CLOSURE OF ACCESS SITE WITH ANGIO-SEAL DEVICE COMPARISON:  CT 02/19/2018, CT ANGIOGRAM 02/19/2018 MEDICATIONS: 2 g Ancef. The antibiotic was administered within 1 hour of the procedure ANESTHESIA/SEDATION: GENERAL ENDOTRACHEAL TUBE ANESTHESIA WITH THE ANESTHESIA TEAM CONTRAST:  140 cc Isovue FLUOROSCOPY TIME:  Fluoroscopy Time: 35 minutes 6 seconds (2373 mGy). COMPLICATIONS: None TECHNIQUE: Informed written consent was obtained from the patient's family after a thorough discussion of the procedural risks,  benefits and alternatives. Specific risks discussed include: Bleeding, infection, contrast reaction, kidney injury/failure, need for further procedure/surgery, arterial injury or dissection, embolization to new territory, intracranial hemorrhage (10-15% risk), neurologic deterioration, cardiopulmonary collapse, death. All questions were addressed. Maximal Sterile Barrier Technique was utilized including during the procedure including caps, mask, sterile gowns, sterile gloves, sterile drape, hand hygiene and skin antiseptic. A timeout was performed prior to the initiation of the procedure. The anesthesia team was present to provide general endotracheal tube anesthesia and for patient monitoring during the procedure. Interventional neuro radiology nursing staff was also present. FINDINGS: Ultrasound images were performed of the right inguinal region, confirming patency of the right common femoral artery. Images were stored and sent to PACs. Initial Findings: Left common carotid artery:  Normal course caliber and contour. Left external carotid artery: Patent with antegrade flow. Left internal carotid artery: There is irregular appearance of the posterior proximal internal carotid artery extending from the carotid bulb along the proximal 3rd of the right internal carotid artery. This results in at least 50 percent narrowing, with irregular blunted appearance on the distal filling defect, compatible with thrombus/dissection. The mid and distal cervical segment of the ICA is patent. Unremarkable petrous segment, lacerum segment, cavernous segment, clinoid segment/ophthalmic segment and terminal segment. Left MCA: MCA occluded at the origin with a reversed meniscus suggesting acute thrombus. Patent anterior choroidal artery and a small posterior communicating artery, which washes out from collateral flow from the posterior. The initial injection demonstrates patent meningo-hypophyseal branch, with sellar blush. Left ACA: A  1 segment patent. A 2 segment perfuses the left territory. Patent anterior communicating artery with transient perfusion of the right ACA territory. Completion Findings: Left MCA: After mechanical thrombectomy, there is reperfusion of the middle cerebral artery, with slow flow through the insular branches, TICI 2b. Early venous drainage is visualized. Upon withdrawal into the common carotid artery, there is occlusion of the proximal cervical ICA secondary to acute dissection/thrombus. External carotid artery remains patent, with early perfusion changes of external to internal collateralization, as there is filling of the carotid siphon via ascending cervical. Right common carotid artery:  Normal course caliber and contour. Right external carotid artery: Patent with antegrade flow. Right internal carotid artery: Normal course caliber and contour of the cervical portion.  Vertical and petrous segment patent with normal course caliber contour. Lacerum segment patent. cavernous segment patent. Clinoid segment patent. Antegrade flow of the ophthalmic artery. Ophthalmic segment patent. Terminus patent. Crossover flow into the left hemisphere, with perfusion of both the ACA territory and the MCA territory at the conclusion. Right MCA: M1 segment patent. Insular and opercular segments patent. Unremarkable caliber and course of the cortical segments. Typical arterial, capillary/ parenchymal, and venous phase. Right ACA: A 1 segment patent. A 2 segment perfuses the right territory. Right vertebral: Cervical vertebral artery have normal course caliber and contour with expected filling of the C3, C2, C1 branches. The dominant runoff of the right vertebral artery is into a right posterior inferior cerebellar artery, with a small right vertebral artery to basilar contribution. The majority of flow into the basilar from the right is washed out from a left vertebral artery inflow. Left vertebral: Normal course caliber and contour of  left vertebral artery. Dominant left vertebral artery. Expected filling of the C3, C2, C1 branches. Dominant vertebral artery contributes to basilar artery, with patent left posterior inferior cerebellar artery, patent bilateral superior cerebellar artery, and patent bilateral posterior cerebral artery, with symmetric opacification from the left vertebral injection. The left posterior cerebral artery contributes to perfusion of the left temporal lobe and occipital lobe. Flat panel CT: CT demonstrates no significant mass effect, however, at the conclusion there is evidence of contrast staining of the left MCA territory gyri as well as a combination of ICH/SAH at the left basal ganglia and sylvian fissure. Evidence of involving left MCA infarction. PROCEDURE: Patient is brought emergently to the neuro angiography suite, with the patient identified appropriately and placed supine position on the table. Left radial arterial line was placed by the anesthesia team. The patient is then prepped and draped in the usual sterile fashion. Ultrasound survey of the right inguinal region was performed with images stored and sent to PACs, confirming patency of the common femoral artery. 11 blade scalpel was used to make a small incision. Blunt dissection was performed. A micropuncture needle was used access the right common femoral artery under ultrasound. With excellent arterial blood flow returned, an .018 micro wire was passed through the needle, observed to enter the abdominal aorta under fluoroscopy. The needle was removed, and a micropuncture sheath was placed over the wire. The inner dilator and wire were removed, and an 035 Bentson wire was advanced under fluoroscopy into the abdominal aorta. The sheath was removed and a standard 5 [place] vascular sheath was placed. The dilator was removed and the sheath was flushed. A 40F JB-1 diagnostic catheter was advanced over the wire to the proximal descending thoracic aorta. Wire was  then removed. Double flush of the catheter was performed. Catheter was then used to select the left common carotid artery. Formal angiogram was performed of the cervical and cerebral vessels. Glidewire was then used to navigate across the dissected proximal cervical ICA. Diagnostic catheter was then advanced into the distal cervical ICA. Exchange length Rosen wire was then passed through the diagnostic catheter to the distal cervical ICA and the diagnostic catheter was removed. The 5 French sheath was removed and exchanged for 8 French 55 centimeter BrightTip sheath. Sheath was flushed and attached to pressurized and heparinized saline bag for constant forward flow. Then a neuron Max 80cm catheter was prepared on the back table. Ace 68 intermediate catheter was then loaded though the Neuron Max catheter with coaxial microcatheter, and advanced through the guide catheter. Under  roadmap angiogram, Microcatheter system was then introduced through the Ace catheter using a synchro soft 014 wire and a Trevo Provue18 catheter. Microcatheter system was advanced into the internal carotid artery, to the level of the occlusion. The micro wire and the microcatheter were carefully placed at the proximal occlusion, and the aspiration catheter was advanced to the occlusion. Microwire in the microcatheter were withdrawn, an aspiration was performed. Aspiration was initiated, confirming return of blood flow. The catheter was then advanced into the occlusion with stasis of blood flow returned. With no blood flow returned after 30 seconds-60 seconds of aspiration, the aspiration catheter was removed entirely from the neuron max. This catheter was then flushed, with clot retrieved. Repeat angiogram performed. Further targets with thrombus were identified within M2 branches. The aspiration catheter was then used as a intermediate catheter, with the microcatheter advanced with the synchro wire. Under road map angiogram, the microcatheter  and microwire were advanced beyond the M2 thrombus. The wire was removed and gentle aspiration was performed confirming blood flow returned. Gentle injection was performed to prove intraluminal location. A rotating hemostatic valve was then attached to the back end of the microcatheter, and a pressurized and heparinized saline bag was attached to the catheter. 4 x 40 solitaire device was then selected. Back flush was achieved at the rotating hemostatic valve, and then the device was gently advanced through the microcatheter to the distal end. The retriever was then unsheathed by withdrawing the microcatheter under fluoroscopy. Once the retriever was completely unsheathed, control angiogram was performed from the balloon catheter. Constant aspiration was then performed through the Ace catheter as the retriever was gently and slowly withdrawn with fluoroscopic observation. Once the retriever was entirely removed from the system, free aspiration was confirmed at the hub of the intermediate catheter, with free blood return confirmed. Control angiogram was then performed. Persistent occlusion at the proximal M2 branch was confirmed. The microcatheter system was then again advanced through the Ace catheter. Once the micro wire microcatheter were beyond the M2 occlusion, the micro wire was removed and solitaire 4 x 40 device was deployed. After the solitaire was deployed across the occlusion, the Ace catheter was advanced into the M1 segment at the site of the occlusion. Local aspiration was performed upon withdrawal of the solitaire device under fluoroscopic observation. Once the retrieve her was entirely removed from the system, free aspiration was confirmed at the hub of the intermediate catheter, with free blood return confirmed. Ace aspiration catheter was removed. Control angiogram was again performed. 035 rose in wire was then advanced through the neuron max into the cervical segment. The neuron max was withdrawn  proximal to the bifurcation with repeat angiogram performed. At this point, the results and evolution of the case as well as the patient management was thoroughly discussed with the neurology team. We elected at this time to perform flat panel CT. After the results demonstrated presence of subarachnoid/ICH, we elected not to place a carotid stent. The neuro on max was removed. Completion cerebral angiogram was then performed of the right carotid system and the bilateral vertebral arteries. Catheter was removed, and the bright tip sheath was exchanged for a short 8 French sheath at the right common femoral artery. Eight [place] Angio-Seal was deployed for hemostasis. Patient tolerated the procedure well and remained hemodynamically stable throughout. No complications were encountered. IMPRESSION: Status post ultrasound guided access right common femoral artery for cerebral angiogram and mechanical thrombectomy of left MCA ELVO, achieving TICI 2b reperfusion after  3 passes with a combination of ADAPT technique and SOLUMBRA technique using a 4x40 solitaire. Status post deployment of Angio-Seal for hemostasis at the access site. Cerebral angiogram confirms acute left ICA dissection as the source of the intracranial emboli, with cervical ICA occluded at the case conclusion. Signed, Dulcy Fanny. Dellia Nims, RPVI Vascular and Interventional Radiology Specialists [facility] Radiology PLAN: Given the presence of hemorrhage, the patient will stay intubated. Straight to CT for baseline CT scan. Right common femoral artery has been closed with Angio-Seal. Local wound care and right hip straight for 4 hours. The patient will be admitted to the neuro ICU. Target blood pressure goal of less than 132 systolic Electronically Signed   By: Corrie Mckusick D.O.   On: 02/20/2018 14:20   Ct Cerebral Perfusion W Contrast  Result Date: 02/19/2018 CLINICAL DATA:  Stroke.  Right-sided weakness aphasia EXAM: CT ANGIOGRAPHY HEAD AND NECK CT  PERFUSION BRAIN TECHNIQUE: Multidetector CT imaging of the head and neck was performed using the standard protocol during bolus administration of intravenous contrast. Multiplanar CT image reconstructions and MIPs were obtained to evaluate the vascular anatomy. Carotid stenosis measurements (when applicable) are obtained utilizing NASCET criteria, using the distal internal carotid diameter as the denominator. Multiphase CT imaging of the brain was performed following IV bolus contrast injection. Subsequent parametric perfusion maps were calculated using RAPID software. CONTRAST:  53mL ISOVUE-370 IOPAMIDOL (ISOVUE-370) INJECTION 76% COMPARISON:  CT head 02/19/2018 FINDINGS: CTA NECK FINDINGS Aortic arch: Normal aortic arch without atherosclerotic disease or aneurysm. Proximal great vessels normal Right carotid system: Normal right carotid. No evidence of atherosclerotic disease dissection or stenosis Left carotid system: Irregularity and stenosis of the proximal left internal carotid artery compatible with dissection. No significant calcification. Intraluminal filling defect compatible with thrombus. Left internal carotid artery small in caliber but patent to the cavernous segment. There is occlusion of the supraclinoid internal carotid artery on the left. Vertebral arteries: Normal Skeleton: Normal.  Periapical cyst around right upper incisor Other neck: Negative for mass or adenopathy. Upper chest: Negative Review of the MIP images confirms the above findings CTA HEAD FINDINGS Anterior circulation: Slow flow with small caliber of the left cavernous carotid which then occludes in the supraclinoid component. Occlusion of the proximal left A1. Occlusion of left M1 segment. Occlusion of left M2 branches diffusely. Right middle cerebral artery widely patent. A2 segments patent bilaterally. Posterior circulation: Both vertebral arteries patent to the basilar. Left vertebral dominant. PICA patent bilaterally. Basilar  patent. Superior cerebellar and posterior cerebral arteries patent bilaterally. Venous sinuses: Negative Anatomic variants: None Delayed phase: Not performed Review of the MIP images confirms the above findings CT Brain Perfusion Findings: CBF (<30%) Volume: 181mL Perfusion (Tmax>6.0s) volume: 126mL Mismatch Volume: 39mL Infarction Location:Left MCA territory including left basal ganglia. IMPRESSION: Large territory left MCA acute infarct. 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 Occlusion left A1 with reconstitution left A2. Occlusion left M1 and M2 segments. Call is currently in for Dr. Leonides Schanz. Electronically Signed   By: Franchot Gallo M.D.   On: 02/19/2018 12:59   Dg Chest Port 1 View  Result Date: 02/21/2018 CLINICAL DATA:  Hypoxia EXAM: PORTABLE CHEST 1 VIEW COMPARISON:  February 20, 2018 FINDINGS: Endotracheal tube tip is 4.0 cm above the carina. Nasogastric tube tip and side port are below the diaphragm. Central catheter tip is at the cavoatrial junction. No pneumothorax. There is atelectatic  change in the lung bases. Lungs elsewhere clear. Heart is upper normal in size with pulmonary vascularity normal. No adenopathy. No bone lesions. IMPRESSION: Tube and catheter positions as described without pneumothorax. Lower lobe atelectatic change. No edema or consolidation. Stable cardiac silhouette. Electronically Signed   By: Lowella Grip III M.D.   On: 02/21/2018 07:25   Dg Chest Port 1 View  Result Date: 02/20/2018 CLINICAL DATA:  Respiratory failure,central line placement EXAM: PORTABLE CHEST 1 VIEW COMPARISON:  02/20/2018 FINDINGS: Endotracheal tube is in place, tip approximately 7.1 centimeters above the carina. Nasogastric tube is in place, tip beyond the level of the mid stomach. A LEFT IJ central line tip overlies the superior vena cava and is new since the previous exam.  There is no pneumothorax. Heart size is normal. Mild subsegmental atelectasis in the MEDIAL LEFT lung base. Remote RIGHT rib fracture. IMPRESSION: Interval placement of LEFT IJ central line.  No pneumothorax. LEFT LOWER lobe atelectasis. Electronically Signed   By: Nolon Nations M.D.   On: 02/20/2018 16:50   Dg Chest Port 1 View  Result Date: 02/20/2018 CLINICAL DATA:  Endotracheal tube placement EXAM: PORTABLE CHEST 1 VIEW COMPARISON:  02/20/2018 FINDINGS: Endotracheal tube 6.5 cm above the carina unchanged from earlier today. NG tube enters the stomach with side hole in the distal esophagus similar to the prior study. Mild bibasilar atelectasis similar to the prior study. Negative for edema or effusion IMPRESSION: Endotracheal tube 6.5 cm above the carina unchanged NG tube in the distal esophagus unchanged Mild bibasilar atelectasis unchanged Electronically Signed   By: Franchot Gallo M.D.   On: 02/20/2018 15:34   Dg Chest Port 1 View  Result Date: 02/20/2018 CLINICAL DATA:  37 year old male with respiratory failure status post left internal carotid artery dissection and left MCA territory infarct. EXAM: PORTABLE CHEST 1 VIEW COMPARISON:  Prior chest x-ray 02/19/2018 FINDINGS: The patient is intubated. The tip of the endotracheal tube is 6.8 cm above the carina. A nasogastric tube is present. The tip of the tube overlies the GE junction and is not within the stomach. Low inspiratory volumes with perhaps minimal bibasilar atelectasis. The lungs are otherwise clear. Remote healed right seventh rib fracture. IMPRESSION: 1. The tip of the nasogastric tube overlies the gastroesophageal junction and is not within the stomach. If the intends to decompress the stomach, recommend advancing 10 cm. 2. Low inspiratory volumes with minimal bibasilar atelectasis. 3. The tip of the endotracheal tube is 6.7 cm above the carina. These results will be called to the ordering clinician or representative by the Radiologist  Assistant, and communication documented in the PACS or zVision Dashboard. Electronically Signed   By: Jacqulynn Cadet M.D.   On: 02/20/2018 08:55   Ir Percutaneous Art Thrombectomy/infusion Intracranial Inc Diag Angio  Result Date: 02/20/2018 INDICATION: 37 year old male with a history of acute left MCA stroke Given the patient's age and baseline function, thrombectomy was offered to the patient's family as a possibility of improving his overall function and giving him the best chance of recovery, accepting a high risk of intracranial hemorrhage/malignant edema given the size of the infarction. EXAM: ULTRASOUND GUIDED ACCESS RIGHT COMMON FEMORAL ARTERY CEREBRAL ANGIOGRAM MECHANICAL THROMBECTOMY LEFT MCA CLOSURE OF ACCESS SITE WITH ANGIO-SEAL DEVICE COMPARISON:  CT 02/19/2018, CT ANGIOGRAM 02/19/2018 MEDICATIONS: 2 g Ancef. The antibiotic was administered within 1 hour of the procedure ANESTHESIA/SEDATION: GENERAL ENDOTRACHEAL TUBE ANESTHESIA WITH THE ANESTHESIA TEAM CONTRAST:  140 cc Isovue FLUOROSCOPY TIME:  Fluoroscopy Time: 35 minutes 6  seconds (2373 mGy). COMPLICATIONS: None TECHNIQUE: Informed written consent was obtained from the patient's family after a thorough discussion of the procedural risks, benefits and alternatives. Specific risks discussed include: Bleeding, infection, contrast reaction, kidney injury/failure, need for further procedure/surgery, arterial injury or dissection, embolization to new territory, intracranial hemorrhage (10-15% risk), neurologic deterioration, cardiopulmonary collapse, death. All questions were addressed. Maximal Sterile Barrier Technique was utilized including during the procedure including caps, mask, sterile gowns, sterile gloves, sterile drape, hand hygiene and skin antiseptic. A timeout was performed prior to the initiation of the procedure. The anesthesia team was present to provide general endotracheal tube anesthesia and for patient monitoring during the  procedure. Interventional neuro radiology nursing staff was also present. FINDINGS: Ultrasound images were performed of the right inguinal region, confirming patency of the right common femoral artery. Images were stored and sent to PACs. Initial Findings: Left common carotid artery:  Normal course caliber and contour. Left external carotid artery: Patent with antegrade flow. Left internal carotid artery: There is irregular appearance of the posterior proximal internal carotid artery extending from the carotid bulb along the proximal 3rd of the right internal carotid artery. This results in at least 50 percent narrowing, with irregular blunted appearance on the distal filling defect, compatible with thrombus/dissection. The mid and distal cervical segment of the ICA is patent. Unremarkable petrous segment, lacerum segment, cavernous segment, clinoid segment/ophthalmic segment and terminal segment. Left MCA: MCA occluded at the origin with a reversed meniscus suggesting acute thrombus. Patent anterior choroidal artery and a small posterior communicating artery, which washes out from collateral flow from the posterior. The initial injection demonstrates patent meningo-hypophyseal branch, with sellar blush. Left ACA: A 1 segment patent. A 2 segment perfuses the left territory. Patent anterior communicating artery with transient perfusion of the right ACA territory. Completion Findings: Left MCA: After mechanical thrombectomy, there is reperfusion of the middle cerebral artery, with slow flow through the insular branches, TICI 2b. Early venous drainage is visualized. Upon withdrawal into the common carotid artery, there is occlusion of the proximal cervical ICA secondary to acute dissection/thrombus. External carotid artery remains patent, with early perfusion changes of external to internal collateralization, as there is filling of the carotid siphon via ascending cervical. Right common carotid artery:  Normal course  caliber and contour. Right external carotid artery: Patent with antegrade flow. Right internal carotid artery: Normal course caliber and contour of the cervical portion. Vertical and petrous segment patent with normal course caliber contour. Lacerum segment patent. cavernous segment patent. Clinoid segment patent. Antegrade flow of the ophthalmic artery. Ophthalmic segment patent. Terminus patent. Crossover flow into the left hemisphere, with perfusion of both the ACA territory and the MCA territory at the conclusion. Right MCA: M1 segment patent. Insular and opercular segments patent. Unremarkable caliber and course of the cortical segments. Typical arterial, capillary/ parenchymal, and venous phase. Right ACA: A 1 segment patent. A 2 segment perfuses the right territory. Right vertebral: Cervical vertebral artery have normal course caliber and contour with expected filling of the C3, C2, C1 branches. The dominant runoff of the right vertebral artery is into a right posterior inferior cerebellar artery, with a small right vertebral artery to basilar contribution. The majority of flow into the basilar from the right is washed out from a left vertebral artery inflow. Left vertebral: Normal course caliber and contour of left vertebral artery. Dominant left vertebral artery. Expected filling of the C3, C2, C1 branches. Dominant vertebral artery contributes to basilar artery, with patent left  posterior inferior cerebellar artery, patent bilateral superior cerebellar artery, and patent bilateral posterior cerebral artery, with symmetric opacification from the left vertebral injection. The left posterior cerebral artery contributes to perfusion of the left temporal lobe and occipital lobe. Flat panel CT: CT demonstrates no significant mass effect, however, at the conclusion there is evidence of contrast staining of the left MCA territory gyri as well as a combination of ICH/SAH at the left basal ganglia and sylvian  fissure. Evidence of involving left MCA infarction. PROCEDURE: Patient is brought emergently to the neuro angiography suite, with the patient identified appropriately and placed supine position on the table. Left radial arterial line was placed by the anesthesia team. The patient is then prepped and draped in the usual sterile fashion. Ultrasound survey of the right inguinal region was performed with images stored and sent to PACs, confirming patency of the common femoral artery. 11 blade scalpel was used to make a small incision. Blunt dissection was performed. A micropuncture needle was used access the right common femoral artery under ultrasound. With excellent arterial blood flow returned, an .018 micro wire was passed through the needle, observed to enter the abdominal aorta under fluoroscopy. The needle was removed, and a micropuncture sheath was placed over the wire. The inner dilator and wire were removed, and an 035 Bentson wire was advanced under fluoroscopy into the abdominal aorta. The sheath was removed and a standard 5 [place] vascular sheath was placed. The dilator was removed and the sheath was flushed. A 36F JB-1 diagnostic catheter was advanced over the wire to the proximal descending thoracic aorta. Wire was then removed. Double flush of the catheter was performed. Catheter was then used to select the left common carotid artery. Formal angiogram was performed of the cervical and cerebral vessels. Glidewire was then used to navigate across the dissected proximal cervical ICA. Diagnostic catheter was then advanced into the distal cervical ICA. Exchange length Rosen wire was then passed through the diagnostic catheter to the distal cervical ICA and the diagnostic catheter was removed. The 5 French sheath was removed and exchanged for 8 French 55 centimeter BrightTip sheath. Sheath was flushed and attached to pressurized and heparinized saline bag for constant forward flow. Then a neuron Max 80cm  catheter was prepared on the back table. Ace 68 intermediate catheter was then loaded though the Neuron Max catheter with coaxial microcatheter, and advanced through the guide catheter. Under roadmap angiogram, Microcatheter system was then introduced through the Ace catheter using a synchro soft 014 wire and a Trevo Provue18 catheter. Microcatheter system was advanced into the internal carotid artery, to the level of the occlusion. The micro wire and the microcatheter were carefully placed at the proximal occlusion, and the aspiration catheter was advanced to the occlusion. Microwire in the microcatheter were withdrawn, an aspiration was performed. Aspiration was initiated, confirming return of blood flow. The catheter was then advanced into the occlusion with stasis of blood flow returned. With no blood flow returned after 30 seconds-60 seconds of aspiration, the aspiration catheter was removed entirely from the neuron max. This catheter was then flushed, with clot retrieved. Repeat angiogram performed. Further targets with thrombus were identified within M2 branches. The aspiration catheter was then used as a intermediate catheter, with the microcatheter advanced with the synchro wire. Under road map angiogram, the microcatheter and microwire were advanced beyond the M2 thrombus. The wire was removed and gentle aspiration was performed confirming blood flow returned. Gentle injection was performed to prove intraluminal location.  A rotating hemostatic valve was then attached to the back end of the microcatheter, and a pressurized and heparinized saline bag was attached to the catheter. 4 x 40 solitaire device was then selected. Back flush was achieved at the rotating hemostatic valve, and then the device was gently advanced through the microcatheter to the distal end. The retriever was then unsheathed by withdrawing the microcatheter under fluoroscopy. Once the retriever was completely unsheathed, control  angiogram was performed from the balloon catheter. Constant aspiration was then performed through the Ace catheter as the retriever was gently and slowly withdrawn with fluoroscopic observation. Once the retriever was entirely removed from the system, free aspiration was confirmed at the hub of the intermediate catheter, with free blood return confirmed. Control angiogram was then performed. Persistent occlusion at the proximal M2 branch was confirmed. The microcatheter system was then again advanced through the Ace catheter. Once the micro wire microcatheter were beyond the M2 occlusion, the micro wire was removed and solitaire 4 x 40 device was deployed. After the solitaire was deployed across the occlusion, the Ace catheter was advanced into the M1 segment at the site of the occlusion. Local aspiration was performed upon withdrawal of the solitaire device under fluoroscopic observation. Once the retrieve her was entirely removed from the system, free aspiration was confirmed at the hub of the intermediate catheter, with free blood return confirmed. Ace aspiration catheter was removed. Control angiogram was again performed. 035 rose in wire was then advanced through the neuron max into the cervical segment. The neuron max was withdrawn proximal to the bifurcation with repeat angiogram performed. At this point, the results and evolution of the case as well as the patient management was thoroughly discussed with the neurology team. We elected at this time to perform flat panel CT. After the results demonstrated presence of subarachnoid/ICH, we elected not to place a carotid stent. The neuro on max was removed. Completion cerebral angiogram was then performed of the right carotid system and the bilateral vertebral arteries. Catheter was removed, and the bright tip sheath was exchanged for a short 8 French sheath at the right common femoral artery. Eight [place] Angio-Seal was deployed for hemostasis. Patient tolerated  the procedure well and remained hemodynamically stable throughout. No complications were encountered. IMPRESSION: Status post ultrasound guided access right common femoral artery for cerebral angiogram and mechanical thrombectomy of left MCA ELVO, achieving TICI 2b reperfusion after 3 passes with a combination of ADAPT technique and SOLUMBRA technique using a 4x40 solitaire. Status post deployment of Angio-Seal for hemostasis at the access site. Cerebral angiogram confirms acute left ICA dissection as the source of the intracranial emboli, with cervical ICA occluded at the case conclusion. Signed, Dulcy Fanny. Dellia Nims, RPVI Vascular and Interventional Radiology Specialists [facility] Radiology PLAN: Given the presence of hemorrhage, the patient will stay intubated. Straight to CT for baseline CT scan. Right common femoral artery has been closed with Angio-Seal. Local wound care and right hip straight for 4 hours. The patient will be admitted to the neuro ICU. Target blood pressure goal of less than 045 systolic Electronically Signed   By: Corrie Mckusick D.O.   On: 02/20/2018 14:20   Ct Head Code Stroke Wo Contrast  Result Date: 02/19/2018 CLINICAL DATA:  Code stroke.  Right-sided weakness, aphasia EXAM: CT HEAD WITHOUT CONTRAST TECHNIQUE: Contiguous axial images were obtained from the base of the skull through the vertex without intravenous contrast. COMPARISON:  None. FINDINGS: Brain: Hypodensity left occipital parietal cortex and  white matter compatible with acute infarct. Patchy hypodensity in the left frontal lobe also consistent with infarct which is probably acute. Negative for hemorrhage.  Ventricle size normal.  No mass lesion. Vascular: Negative for hyperdense vessel Skull: Negative Sinuses/Orbits: Mild mucosal edema paranasal sinuses.  Normal orbit Other: None ASPECTS ([place] Stroke Program Early CT Score) - Ganglionic level infarction (caudate, lentiform nuclei, internal capsule, insula, M1-M3 cortex):  7 - Supraganglionic infarction (M4-M6 cortex): 2 Total score (0-10 with 10 being normal): 8 IMPRESSION: 1. Hypodensities left frontal lobe and left occipital parietal lobe most compatible with acute infarct. Possible emboli or watershed infarct. Negative for hemorrhage or mass-effect 2. ASPECTS is 8 3. These results were called by telephone at the time of interpretation on 02/19/2018 at 12:29 pm to Dr. Leonel Ramsay , who verbally acknowledged these results. Electronically Signed   By: Franchot Gallo M.D.   On: 02/19/2018 12:31   Ir Angio Intra Extracran Sel Com Carotid Innominate Uni R Mod Sed  Result Date: 02/20/2018 INDICATION: 37 year old male with a history of acute left MCA stroke Given the patient's age and baseline function, thrombectomy was offered to the patient's family as a possibility of improving his overall function and giving him the best chance of recovery, accepting a high risk of intracranial hemorrhage/malignant edema given the size of the infarction. EXAM: ULTRASOUND GUIDED ACCESS RIGHT COMMON FEMORAL ARTERY CEREBRAL ANGIOGRAM MECHANICAL THROMBECTOMY LEFT MCA CLOSURE OF ACCESS SITE WITH ANGIO-SEAL DEVICE COMPARISON:  CT 02/19/2018, CT ANGIOGRAM 02/19/2018 MEDICATIONS: 2 g Ancef. The antibiotic was administered within 1 hour of the procedure ANESTHESIA/SEDATION: GENERAL ENDOTRACHEAL TUBE ANESTHESIA WITH THE ANESTHESIA TEAM CONTRAST:  140 cc Isovue FLUOROSCOPY TIME:  Fluoroscopy Time: 35 minutes 6 seconds (2373 mGy). COMPLICATIONS: None TECHNIQUE: Informed written consent was obtained from the patient's family after a thorough discussion of the procedural risks, benefits and alternatives. Specific risks discussed include: Bleeding, infection, contrast reaction, kidney injury/failure, need for further procedure/surgery, arterial injury or dissection, embolization to new territory, intracranial hemorrhage (10-15% risk), neurologic deterioration, cardiopulmonary collapse, death. All questions were  addressed. Maximal Sterile Barrier Technique was utilized including during the procedure including caps, mask, sterile gowns, sterile gloves, sterile drape, hand hygiene and skin antiseptic. A timeout was performed prior to the initiation of the procedure. The anesthesia team was present to provide general endotracheal tube anesthesia and for patient monitoring during the procedure. Interventional neuro radiology nursing staff was also present. FINDINGS: Ultrasound images were performed of the right inguinal region, confirming patency of the right common femoral artery. Images were stored and sent to PACs. Initial Findings: Left common carotid artery:  Normal course caliber and contour. Left external carotid artery: Patent with antegrade flow. Left internal carotid artery: There is irregular appearance of the posterior proximal internal carotid artery extending from the carotid bulb along the proximal 3rd of the right internal carotid artery. This results in at least 50 percent narrowing, with irregular blunted appearance on the distal filling defect, compatible with thrombus/dissection. The mid and distal cervical segment of the ICA is patent. Unremarkable petrous segment, lacerum segment, cavernous segment, clinoid segment/ophthalmic segment and terminal segment. Left MCA: MCA occluded at the origin with a reversed meniscus suggesting acute thrombus. Patent anterior choroidal artery and a small posterior communicating artery, which washes out from collateral flow from the posterior. The initial injection demonstrates patent meningo-hypophyseal branch, with sellar blush. Left ACA: A 1 segment patent. A 2 segment perfuses the left territory. Patent anterior communicating artery with transient perfusion of the right  ACA territory. Completion Findings: Left MCA: After mechanical thrombectomy, there is reperfusion of the middle cerebral artery, with slow flow through the insular branches, TICI 2b. Early venous drainage  is visualized. Upon withdrawal into the common carotid artery, there is occlusion of the proximal cervical ICA secondary to acute dissection/thrombus. External carotid artery remains patent, with early perfusion changes of external to internal collateralization, as there is filling of the carotid siphon via ascending cervical. Right common carotid artery:  Normal course caliber and contour. Right external carotid artery: Patent with antegrade flow. Right internal carotid artery: Normal course caliber and contour of the cervical portion. Vertical and petrous segment patent with normal course caliber contour. Lacerum segment patent. cavernous segment patent. Clinoid segment patent. Antegrade flow of the ophthalmic artery. Ophthalmic segment patent. Terminus patent. Crossover flow into the left hemisphere, with perfusion of both the ACA territory and the MCA territory at the conclusion. Right MCA: M1 segment patent. Insular and opercular segments patent. Unremarkable caliber and course of the cortical segments. Typical arterial, capillary/ parenchymal, and venous phase. Right ACA: A 1 segment patent. A 2 segment perfuses the right territory. Right vertebral: Cervical vertebral artery have normal course caliber and contour with expected filling of the C3, C2, C1 branches. The dominant runoff of the right vertebral artery is into a right posterior inferior cerebellar artery, with a small right vertebral artery to basilar contribution. The majority of flow into the basilar from the right is washed out from a left vertebral artery inflow. Left vertebral: Normal course caliber and contour of left vertebral artery. Dominant left vertebral artery. Expected filling of the C3, C2, C1 branches. Dominant vertebral artery contributes to basilar artery, with patent left posterior inferior cerebellar artery, patent bilateral superior cerebellar artery, and patent bilateral posterior cerebral artery, with symmetric opacification from  the left vertebral injection. The left posterior cerebral artery contributes to perfusion of the left temporal lobe and occipital lobe. Flat panel CT: CT demonstrates no significant mass effect, however, at the conclusion there is evidence of contrast staining of the left MCA territory gyri as well as a combination of ICH/SAH at the left basal ganglia and sylvian fissure. Evidence of involving left MCA infarction. PROCEDURE: Patient is brought emergently to the neuro angiography suite, with the patient identified appropriately and placed supine position on the table. Left radial arterial line was placed by the anesthesia team. The patient is then prepped and draped in the usual sterile fashion. Ultrasound survey of the right inguinal region was performed with images stored and sent to PACs, confirming patency of the common femoral artery. 11 blade scalpel was used to make a small incision. Blunt dissection was performed. A micropuncture needle was used access the right common femoral artery under ultrasound. With excellent arterial blood flow returned, an .018 micro wire was passed through the needle, observed to enter the abdominal aorta under fluoroscopy. The needle was removed, and a micropuncture sheath was placed over the wire. The inner dilator and wire were removed, and an 035 Bentson wire was advanced under fluoroscopy into the abdominal aorta. The sheath was removed and a standard 5 [place] vascular sheath was placed. The dilator was removed and the sheath was flushed. A 22F JB-1 diagnostic catheter was advanced over the wire to the proximal descending thoracic aorta. Wire was then removed. Double flush of the catheter was performed. Catheter was then used to select the left common carotid artery. Formal angiogram was performed of the cervical and cerebral vessels. Glidewire was  then used to navigate across the dissected proximal cervical ICA. Diagnostic catheter was then advanced into the distal cervical  ICA. Exchange length Rosen wire was then passed through the diagnostic catheter to the distal cervical ICA and the diagnostic catheter was removed. The 5 French sheath was removed and exchanged for 8 French 55 centimeter BrightTip sheath. Sheath was flushed and attached to pressurized and heparinized saline bag for constant forward flow. Then a neuron Max 80cm catheter was prepared on the back table. Ace 68 intermediate catheter was then loaded though the Neuron Max catheter with coaxial microcatheter, and advanced through the guide catheter. Under roadmap angiogram, Microcatheter system was then introduced through the Ace catheter using a synchro soft 014 wire and a Trevo Provue18 catheter. Microcatheter system was advanced into the internal carotid artery, to the level of the occlusion. The micro wire and the microcatheter were carefully placed at the proximal occlusion, and the aspiration catheter was advanced to the occlusion. Microwire in the microcatheter were withdrawn, an aspiration was performed. Aspiration was initiated, confirming return of blood flow. The catheter was then advanced into the occlusion with stasis of blood flow returned. With no blood flow returned after 30 seconds-60 seconds of aspiration, the aspiration catheter was removed entirely from the neuron max. This catheter was then flushed, with clot retrieved. Repeat angiogram performed. Further targets with thrombus were identified within M2 branches. The aspiration catheter was then used as a intermediate catheter, with the microcatheter advanced with the synchro wire. Under road map angiogram, the microcatheter and microwire were advanced beyond the M2 thrombus. The wire was removed and gentle aspiration was performed confirming blood flow returned. Gentle injection was performed to prove intraluminal location. A rotating hemostatic valve was then attached to the back end of the microcatheter, and a pressurized and heparinized saline bag  was attached to the catheter. 4 x 40 solitaire device was then selected. Back flush was achieved at the rotating hemostatic valve, and then the device was gently advanced through the microcatheter to the distal end. The retriever was then unsheathed by withdrawing the microcatheter under fluoroscopy. Once the retriever was completely unsheathed, control angiogram was performed from the balloon catheter. Constant aspiration was then performed through the Ace catheter as the retriever was gently and slowly withdrawn with fluoroscopic observation. Once the retriever was entirely removed from the system, free aspiration was confirmed at the hub of the intermediate catheter, with free blood return confirmed. Control angiogram was then performed. Persistent occlusion at the proximal M2 branch was confirmed. The microcatheter system was then again advanced through the Ace catheter. Once the micro wire microcatheter were beyond the M2 occlusion, the micro wire was removed and solitaire 4 x 40 device was deployed. After the solitaire was deployed across the occlusion, the Ace catheter was advanced into the M1 segment at the site of the occlusion. Local aspiration was performed upon withdrawal of the solitaire device under fluoroscopic observation. Once the retrieve her was entirely removed from the system, free aspiration was confirmed at the hub of the intermediate catheter, with free blood return confirmed. Ace aspiration catheter was removed. Control angiogram was again performed. 035 rose in wire was then advanced through the neuron max into the cervical segment. The neuron max was withdrawn proximal to the bifurcation with repeat angiogram performed. At this point, the results and evolution of the case as well as the patient management was thoroughly discussed with the neurology team. We elected at this time to perform flat panel  CT. After the results demonstrated presence of subarachnoid/ICH, we elected not to place a  carotid stent. The neuro on max was removed. Completion cerebral angiogram was then performed of the right carotid system and the bilateral vertebral arteries. Catheter was removed, and the bright tip sheath was exchanged for a short 8 French sheath at the right common femoral artery. Eight [place] Angio-Seal was deployed for hemostasis. Patient tolerated the procedure well and remained hemodynamically stable throughout. No complications were encountered. IMPRESSION: Status post ultrasound guided access right common femoral artery for cerebral angiogram and mechanical thrombectomy of left MCA ELVO, achieving TICI 2b reperfusion after 3 passes with a combination of ADAPT technique and SOLUMBRA technique using a 4x40 solitaire. Status post deployment of Angio-Seal for hemostasis at the access site. Cerebral angiogram confirms acute left ICA dissection as the source of the intracranial emboli, with cervical ICA occluded at the case conclusion. Signed, Dulcy Fanny. Dellia Nims, RPVI [facility] Radiology PLAN: Given the presence of hemorrhage, the patient will stay intubated. Straight to CT for baseline CT scan. Right common femoral artery has been closed with Angio-Seal. Local wound care and right hip straight for 4 hours. The patient will be admitted to the neuro ICU. Target blood pressure goal of less than 196 systolic Electronically Signed   By: Corrie Mckusick D.O.   On: 02/20/2018 14:20   Ir Angio Vertebral Sel Vertebral Bilat Mod Sed  Result Date: 02/20/2018 INDICATION: 37 year old male with a history of acute left MCA stroke Given the patient's age and baseline function, thrombectomy was offered to the patient's family as a possibility of improving his overall function and giving him the best chance of recovery, accepting a high risk of intracranial hemorrhage/malignant edema given the size of the infarction. EXAM: ULTRASOUND GUIDED ACCESS RIGHT COMMON FEMORAL ARTERY  CEREBRAL ANGIOGRAM MECHANICAL THROMBECTOMY LEFT MCA CLOSURE OF ACCESS SITE WITH ANGIO-SEAL DEVICE COMPARISON:  CT 02/19/2018, CT ANGIOGRAM 02/19/2018 MEDICATIONS: 2 g Ancef. The antibiotic was administered within 1 hour of the procedure ANESTHESIA/SEDATION: GENERAL ENDOTRACHEAL TUBE ANESTHESIA WITH THE ANESTHESIA TEAM CONTRAST:  140 cc Isovue FLUOROSCOPY TIME:  Fluoroscopy Time: 35 minutes 6 seconds (2373 mGy). COMPLICATIONS: None TECHNIQUE: Informed written consent was obtained from the patient's family after a thorough discussion of the procedural risks, benefits and alternatives. Specific risks discussed include: Bleeding, infection, contrast reaction, kidney injury/failure, need for further procedure/surgery, arterial injury or dissection, embolization to new territory, intracranial hemorrhage (10-15% risk), neurologic deterioration, cardiopulmonary collapse, death. All questions were addressed. Maximal Sterile Barrier Technique was utilized including during the procedure including caps, mask, sterile gowns, sterile gloves, sterile drape, hand hygiene and skin antiseptic. A timeout was performed prior to the initiation of the procedure. The anesthesia team was present to provide general endotracheal tube anesthesia and for patient monitoring during the procedure. Interventional neuro radiology nursing staff was also present. FINDINGS: Ultrasound images were performed of the right inguinal region, confirming patency of the right common femoral artery. Images were stored and sent to PACs. Initial Findings: Left common carotid artery:  Normal course caliber and contour. Left external carotid artery: Patent with antegrade flow. Left internal carotid artery: There is irregular appearance of the posterior proximal internal carotid artery extending from the carotid bulb along the proximal 3rd of the right internal carotid artery. This results in at least 50 percent narrowing, with irregular blunted appearance on the  distal filling defect, compatible with thrombus/dissection. The mid and distal cervical segment of the ICA is patent.  Unremarkable petrous segment, lacerum segment, cavernous segment, clinoid segment/ophthalmic segment and terminal segment. Left MCA: MCA occluded at the origin with a reversed meniscus suggesting acute thrombus. Patent anterior choroidal artery and a small posterior communicating artery, which washes out from collateral flow from the posterior. The initial injection demonstrates patent meningo-hypophyseal branch, with sellar blush. Left ACA: A 1 segment patent. A 2 segment perfuses the left territory. Patent anterior communicating artery with transient perfusion of the right ACA territory. Completion Findings: Left MCA: After mechanical thrombectomy, there is reperfusion of the middle cerebral artery, with slow flow through the insular branches, TICI 2b. Early venous drainage is visualized. Upon withdrawal into the common carotid artery, there is occlusion of the proximal cervical ICA secondary to acute dissection/thrombus. External carotid artery remains patent, with early perfusion changes of external to internal collateralization, as there is filling of the carotid siphon via ascending cervical. Right common carotid artery:  Normal course caliber and contour. Right external carotid artery: Patent with antegrade flow. Right internal carotid artery: Normal course caliber and contour of the cervical portion. Vertical and petrous segment patent with normal course caliber contour. Lacerum segment patent. cavernous segment patent. Clinoid segment patent. Antegrade flow of the ophthalmic artery. Ophthalmic segment patent. Terminus patent. Crossover flow into the left hemisphere, with perfusion of both the ACA territory and the MCA territory at the conclusion. Right MCA: M1 segment patent. Insular and opercular segments patent. Unremarkable caliber and course of the cortical segments. Typical arterial,  capillary/ parenchymal, and venous phase. Right ACA: A 1 segment patent. A 2 segment perfuses the right territory. Right vertebral: Cervical vertebral artery have normal course caliber and contour with expected filling of the C3, C2, C1 branches. The dominant runoff of the right vertebral artery is into a right posterior inferior cerebellar artery, with a small right vertebral artery to basilar contribution. The majority of flow into the basilar from the right is washed out from a left vertebral artery inflow. Left vertebral: Normal course caliber and contour of left vertebral artery. Dominant left vertebral artery. Expected filling of the C3, C2, C1 branches. Dominant vertebral artery contributes to basilar artery, with patent left posterior inferior cerebellar artery, patent bilateral superior cerebellar artery, and patent bilateral posterior cerebral artery, with symmetric opacification from the left vertebral injection. The left posterior cerebral artery contributes to perfusion of the left temporal lobe and occipital lobe. Flat panel CT: CT demonstrates no significant mass effect, however, at the conclusion there is evidence of contrast staining of the left MCA territory gyri as well as a combination of ICH/SAH at the left basal ganglia and sylvian fissure. Evidence of involving left MCA infarction. PROCEDURE: Patient is brought emergently to the neuro angiography suite, with the patient identified appropriately and placed supine position on the table. Left radial arterial line was placed by the anesthesia team. The patient is then prepped and draped in the usual sterile fashion. Ultrasound survey of the right inguinal region was performed with images stored and sent to PACs, confirming patency of the common femoral artery. 11 blade scalpel was used to make a small incision. Blunt dissection was performed. A micropuncture needle was used access the right common femoral artery under ultrasound. With excellent  arterial blood flow returned, an .018 micro wire was passed through the needle, observed to enter the abdominal aorta under fluoroscopy. The needle was removed, and a micropuncture sheath was placed over the wire. The inner dilator and wire were removed, and an 035 Bentson wire was  advanced under fluoroscopy into the abdominal aorta. The sheath was removed and a standard 5 [place] vascular sheath was placed. The dilator was removed and the sheath was flushed. A 25F JB-1 diagnostic catheter was advanced over the wire to the proximal descending thoracic aorta. Wire was then removed. Double flush of the catheter was performed. Catheter was then used to select the left common carotid artery. Formal angiogram was performed of the cervical and cerebral vessels. Glidewire was then used to navigate across the dissected proximal cervical ICA. Diagnostic catheter was then advanced into the distal cervical ICA. Exchange length Rosen wire was then passed through the diagnostic catheter to the distal cervical ICA and the diagnostic catheter was removed. The 5 French sheath was removed and exchanged for 8 French 55 centimeter BrightTip sheath. Sheath was flushed and attached to pressurized and heparinized saline bag for constant forward flow. Then a neuron Max 80cm catheter was prepared on the back table. Ace 68 intermediate catheter was then loaded though the Neuron Max catheter with coaxial microcatheter, and advanced through the guide catheter. Under roadmap angiogram, Microcatheter system was then introduced through the Ace catheter using a synchro soft 014 wire and a Trevo Provue18 catheter. Microcatheter system was advanced into the internal carotid artery, to the level of the occlusion. The micro wire and the microcatheter were carefully placed at the proximal occlusion, and the aspiration catheter was advanced to the occlusion. Microwire in the microcatheter were withdrawn, an aspiration was performed. Aspiration was  initiated, confirming return of blood flow. The catheter was then advanced into the occlusion with stasis of blood flow returned. With no blood flow returned after 30 seconds-60 seconds of aspiration, the aspiration catheter was removed entirely from the neuron max. This catheter was then flushed, with clot retrieved. Repeat angiogram performed. Further targets with thrombus were identified within M2 branches. The aspiration catheter was then used as a intermediate catheter, with the microcatheter advanced with the synchro wire. Under road map angiogram, the microcatheter and microwire were advanced beyond the M2 thrombus. The wire was removed and gentle aspiration was performed confirming blood flow returned. Gentle injection was performed to prove intraluminal location. A rotating hemostatic valve was then attached to the back end of the microcatheter, and a pressurized and heparinized saline bag was attached to the catheter. 4 x 40 solitaire device was then selected. Back flush was achieved at the rotating hemostatic valve, and then the device was gently advanced through the microcatheter to the distal end. The retriever was then unsheathed by withdrawing the microcatheter under fluoroscopy. Once the retriever was completely unsheathed, control angiogram was performed from the balloon catheter. Constant aspiration was then performed through the Ace catheter as the retriever was gently and slowly withdrawn with fluoroscopic observation. Once the retriever was entirely removed from the system, free aspiration was confirmed at the hub of the intermediate catheter, with free blood return confirmed. Control angiogram was then performed. Persistent occlusion at the proximal M2 branch was confirmed. The microcatheter system was then again advanced through the Ace catheter. Once the micro wire microcatheter were beyond the M2 occlusion, the micro wire was removed and solitaire 4 x 40 device was deployed. After the  solitaire was deployed across the occlusion, the Ace catheter was advanced into the M1 segment at the site of the occlusion. Local aspiration was performed upon withdrawal of the solitaire device under fluoroscopic observation. Once the retrieve her was entirely removed from the system, free aspiration was confirmed at the hub  of the intermediate catheter, with free blood return confirmed. Ace aspiration catheter was removed. Control angiogram was again performed. 035 rose in wire was then advanced through the neuron max into the cervical segment. The neuron max was withdrawn proximal to the bifurcation with repeat angiogram performed. At this point, the results and evolution of the case as well as the patient management was thoroughly discussed with the neurology team. We elected at this time to perform flat panel CT. After the results demonstrated presence of subarachnoid/ICH, we elected not to place a carotid stent. The neuro on max was removed. Completion cerebral angiogram was then performed of the right carotid system and the bilateral vertebral arteries. Catheter was removed, and the bright tip sheath was exchanged for a short 8 French sheath at the right common femoral artery. Eight [place] Angio-Seal was deployed for hemostasis. Patient tolerated the procedure well and remained hemodynamically stable throughout. No complications were encountered. IMPRESSION: Status post ultrasound guided access right common femoral artery for cerebral angiogram and mechanical thrombectomy of left MCA ELVO, achieving TICI 2b reperfusion after 3 passes with a combination of ADAPT technique and SOLUMBRA technique using a 4x40 solitaire. Status post deployment of Angio-Seal for hemostasis at the access site. Cerebral angiogram confirms acute left ICA dissection as the source of the intracranial emboli, with cervical ICA occluded at the case conclusion. Signed, Dulcy Fanny. Dellia Nims, RPVI V[facility] Interventional Radiology  Special[facility] Radiology PLAN: Given the presence of hemorrhage, the patient will stay intubated. Straight to CT for baseline CT scan. Right common femoral artery has been closed with Angio-Seal. Local wound care and right hip straight for 4 hours. The patient will be admitted to the neuro ICU. Target blood pressure goal of less than 938 systolic Electronically Signed   By: Corrie Mckusick D.O.   On: 02/20/2018 14:20    Labs:  CBC: Recent Labs    02/19/18 1215 02/19/18 1219 02/20/18 0547 02/21/18 0539  WBC 15.4*  --  19.5* 14.2*  HGB 18.0* 18.0* 15.2 15.1  HCT 48.7 53.0* 43.0 44.2  PLT 265  --  174 157    COAGS: Recent Labs    02/19/18 1215  INR 1.02  APTT 29    BMP: Recent Labs    02/19/18 1215 02/19/18 1219 02/20/18 0547 02/20/18 1127 02/20/18 1656 02/20/18 2302 02/21/18 0539  NA 140 139 142 142 140 145 150*  K 3.8 3.5 4.0  --   --   --  3.4*  CL 105 101 110  --   --   --  118*  CO2 22  --  23  --   --   --  25  GLUCOSE 141* 137* 133*  --   --   --  120*  BUN <5* <3* 5*  --   --   --  12  CALCIUM 8.7*  --  7.9*  --   --   --  8.0*  CREATININE 0.73 0.80 0.71  --   --   --  0.70  GFRNONAA >60  --  >60  --   --   --  >60  GFRAA >60  --  >60  --   --   --  >60    LIVER FUNCTION TESTS: Recent Labs    02/19/18 1215  BILITOT 1.1  AST 28  ALT 33  ALKPHOS 96  PROT 7.2  ALBUMIN 3.4*    Assessment and Plan:   Left MCA ELVO s/p thrombectomy 02/19/2018  with Dr. Earleen Newport. Patient's condition stable at this time- remains intubated and sedated, no spontaneous movement of extremities but positive Babinski in right foot with sedation on, PERRL sluggish bilaterally. Right groin incision stable. Appreciate and agree with neurology management.  Electronically Signed: Earley Abide, PA-C 02/21/2018, 8:16 AM   I spent a total of 15 Minutes at the the patient's bedside AND on the patient's hospital floor or unit, greater than 50% of which was  counseling/coordinating care for left MCA ELVO s/p thrombectomy.

## 2018-02-21 NOTE — Progress Notes (Addendum)
OT Cancellation Note  Patient Details Name: Xavier Villegas MRN: [id number] DOB: Dec 27, 1980   Cancelled Treatment:    Reason Eval/Treat Not Completed: Patient not medically ready. Remains ventilated and sedated. Will continue to follow.   [place] 02/21/2018, 8:59 AM  Hulda Humphrey OTR/L [phone]  Pt currently in OR for a decompressive hemicraniectomy and duraplasty. OT will attempt as Pt medically ready post-op.  Hulda Humphrey OTR/L [phone] 3:29pm

## 2018-02-21 NOTE — Progress Notes (Signed)
Pt transported from 4N30 to CT and back with no complications.

## 2018-02-21 NOTE — Progress Notes (Signed)
PT Cancellation Note  Patient Details Name: Xavier Villegas MRN: [id number] DOB: [birth year]   Cancelled Treatment:    Reason Eval/Treat Not Completed: Patient not medically ready.  Pt sedated on the vent. Will see pt as able. 02/21/2018  Donnella Sham, [place] [phone]  (pager)    Tessie Fass Rosi Secrist 02/21/2018, 12:59 PM
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
## 2018-02-21 NOTE — Anesthesia Preprocedure Evaluation (Signed)
Anesthesia Evaluation  Patient identified by MRN, date of birth, ID band Patient unresponsive    Reviewed: Patient's Chart, lab work & pertinent test results, Unable to perform ROS - Chart review only  Airway Mallampati: Intubated      Comment: Difficult to examine  Dental no notable dental hx.    Pulmonary Current Smoker,    breath sounds clear to auscultation       Cardiovascular negative cardio ROS   Rhythm:Regular     Neuro/Psych 1. Hypodensities left frontal lobe and left occipital parietal lobe most compatible with acute infarct. Possible emboli or watershed infarct. Negative for hemorrhage or mass-effect  Right sided weakness CVA negative psych ROS   GI/Hepatic negative GI ROS, Neg liver ROS,   Endo/Other  negative endocrine ROS  Renal/GU negative Renal ROS     Musculoskeletal negative musculoskeletal ROS (+)   Abdominal   Peds  Hematology negative hematology ROS (+)   Anesthesia Other Findings code stroke  Reproductive/Obstetrics                             Anesthesia Physical Anesthesia Plan  ASA: IV  Anesthesia Plan: General   Post-op Pain Management:    Induction: Inhalational  PONV Risk Score and Plan: 1 and Treatment may vary due to age or medical condition  Airway Management Planned: Oral ETT  Additional Equipment:   Intra-op Plan:   Post-operative Plan: Post-operative intubation/ventilation  Informed Consent:   History available from chart only  Plan Discussed with: CRNA and Surgeon  Anesthesia Plan Comments:         Anesthesia Quick Evaluation

## 2018-02-21 NOTE — Consult Note (Signed)
Reason for Consult:MCA stroke Referring Physician: neurology  Xavier Villegas is an 37 y.o. male.  HPI: 37 year old gentleman presented with a large MCA stroke 2 days ago said some declining mental status but is also present chemically sedated and intubated. However CT scans and MRIs have shown increased cerebral edema around left MCA stroke with 4 mm midline shift. There is also been a question of vegetations around a heart valve unclear whether this represents endocarditis Xavier Villegas has had no fevers.  History reviewed. No pertinent past medical history.    No family history on file.  Social History:  reports that he has been smoking cigarettes.  He has been smoking about 0.50 packs per day. He has never used smokeless tobacco. He reports that he drinks about 8.4 oz of alcohol per week. He reports that he does not use drugs.  Allergies: No Known Allergies  Medications:   Results for orders placed or performed during the hospital encounter of 02/19/18 (from the past 48 hour(s))  Protime-INR     Status: None   Collection Time: 02/19/18 12:15 PM  Result Value Ref Range   Prothrombin Time 13.3 11.4 - 15.2 seconds   INR 1.02     Comment: Performed at [facility] [address] [place], [place] [address]  APTT     Status: None   Collection Time: 02/19/18 12:15 PM  Result Value Ref Range   aPTT 29 24 - 36 seconds    Comment: Performed at [facility] [address] [place], [place] [address]  CBC     Status: Abnormal   Collection Time: 02/19/18 12:15 PM  Result Value Ref Range   WBC 15.4 (H) 4.0 - 10.5 K/uL   RBC 4.61 4.22 - 5.81 MIL/uL   Hemoglobin 18.0 (H) 13.0 - 17.0 g/dL   HCT 48.7 39.0 - 52.0 %   MCV 105.6 (H) 78.0 - 100.0 fL   MCH 39.0 (H) 26.0 - 34.0 pg   MCHC 37.0 (H) 30.0 - 36.0 g/dL   RDW 13.0 11.5 - 15.5 %   Platelets 265 150 - 400 K/uL    Comment: Performed at [facility] [address] [place], [place] [address]  Differential      Status: Abnormal   Collection Time: 02/19/18 12:15 PM  Result Value Ref Range   Neutrophils Relative % 76 %   Neutro Abs 11.8 (H) 1.7 - 7.7 K/uL   Lymphocytes Relative 17 %   Lymphs Abs 2.6 0.7 - 4.0 K/uL   Monocytes Relative 6 %   Monocytes Absolute 0.9 0.1 - 1.0 K/uL   Eosinophils Relative 0 %   Eosinophils Absolute 0.0 0.0 - 0.7 K/uL   Basophils Relative 0 %   Basophils Absolute 0.1 0.0 - 0.1 K/uL   Immature Granulocytes 1 %   Abs Immature Granulocytes 0.1 0.0 - 0.1 K/uL    Comment: Performed at [facility] [address], [place], [place] [address]  Comprehensive metabolic panel     Status: Abnormal   Collection Time: 02/19/18 12:15 PM  Result Value Ref Range   Sodium 140 135 - 145 mmol/L   Potassium 3.8 3.5 - 5.1 mmol/L   Chloride 105 98 - 111 mmol/L    Comment: Please note change in reference range.   CO2 22 22 - 32 mmol/L   Glucose, Bld 141 (H) 70 - 99 mg/dL    Comment: Please note change in reference range.   BUN <  5 (L) 6 - 20 mg/dL    Comment: Please note change in reference range.   Creatinine, Ser 0.73 0.61 - 1.24 mg/dL   Calcium 8.7 (L) 8.9 - 10.3 mg/dL   Total Protein 7.2 6.5 - 8.1 g/dL   Albumin 3.4 (L) 3.5 - 5.0 g/dL   AST 28 15 - 41 U/L   ALT 33 0 - 44 U/L    Comment: Please note change in reference range.   Alkaline Phosphatase 96 38 - 126 U/L   Total Bilirubin 1.1 0.3 - 1.2 mg/dL   GFR calc non Af Amer >60 >60 mL/min   GFR calc Af Amer >60 >60 mL/min    Comment: (NOTE) The eGFR has been calculated using the CKD EPI equation. This calculation has not been validated in all clinical situations. eGFR's persistently <60 mL/min signify possible Chronic Kidney Disease.    Anion gap 13 5 - 15    Comment: Performed at [facility] [address] [place], [place] [address]  I-stat troponin, ED     Status: None   Collection Time: 02/19/18 12:18 PM  Result Value Ref Range   Troponin i, poc 0.00 0.00 - 0.08 ng/mL   Comment 3            Comment:  Due to the release kinetics of cTnI, a negative result within the first hours of the onset of symptoms does not rule out myocardial infarction with certainty. If myocardial infarction is still suspected, repeat the test at appropriate intervals.   I-Stat Chem 8, ED     Status: Abnormal   Collection Time: 02/19/18 12:19 PM  Result Value Ref Range   Sodium 139 135 - 145 mmol/L   Potassium 3.5 3.5 - 5.1 mmol/L   Chloride 101 98 - 111 mmol/L   BUN <3 (L) 6 - 20 mg/dL    Comment: QA FLAGS AND/OR RANGES MODIFIED BY DEMOGRAPHIC UPDATE ON 07/13 AT 1239   Creatinine, Ser 0.80 0.61 - 1.24 mg/dL   Glucose, Bld 137 (H) 70 - 99 mg/dL   Calcium, Ion 0.97 (L) 1.15 - 1.40 mmol/L   TCO2 23 22 - 32 mmol/L   Hemoglobin 18.0 (H) 13.0 - 17.0 g/dL   HCT 53.0 (H) 39.0 - 52.0 %  HIV antibody (Routine Testing)     Status: None   Collection Time: 02/19/18  3:50 PM  Result Value Ref Range   HIV Screen 4th Generation wRfx Non Reactive Non Reactive    Comment: (NOTE) Performed At: [facility] [address] [place], [place] [id number] Rush Farmer MD [id number]   MRSA PCR Screening     Status: Abnormal   Collection Time: 02/19/18  4:00 PM  Result Value Ref Range   MRSA by PCR POSITIVE (A) NEGATIVE    Comment:        The GeneXpert MRSA Assay (FDA approved for NASAL specimens only), is one component of a comprehensive MRSA colonization surveillance program. It is not intended to diagnose MRSA infection nor to guide or monitor treatment for MRSA infections. RESULT CALLED TO, READ BACK BY AND VERIFIED WITH: S.MAYES RN AT 1752 02/19/18 BY A.DAVIS Performed at [facility], [address] [place], [place] [address]   Triglycerides     Status: None   Collection Time: 02/19/18  4:55 PM  Result Value Ref Range   Triglycerides 130 <150 mg/dL    Comment: Performed at [facility] [address], [place], [place] [address]  Urine rapid drug screen (hosp performed)     Status:  Abnormal   Collection Time: 02/19/18  4:55 PM  Result Value Ref Range   Opiates NONE DETECTED NONE DETECTED   Cocaine NONE DETECTED NONE DETECTED   Benzodiazepines NONE DETECTED NONE DETECTED   Amphetamines NONE DETECTED NONE DETECTED   Tetrahydrocannabinol NONE DETECTED NONE DETECTED   Barbiturates (A) NONE DETECTED    Result not available. Reagent lot number recalled by manufacturer.    Comment: Performed at [facility], [place] [address] [place], [place] [address]  Vitamin B12     Status: None   Collection Time: 02/19/18  4:55 PM  Result Value Ref Range   Vitamin B-12 224 180 - 914 pg/mL    Comment: (NOTE) This assay is not validated for testing neonatal or myeloproliferative syndrome specimens for Vitamin B12 levels. Performed at [facility], [place] [address] [place], [place] [address]   Folate     Status: Abnormal   Collection Time: 02/19/18  4:55 PM  Result Value Ref Range   Folate 1.5 (L) >5.9 ng/mL    Comment: Performed at [facility], [place] [address], [place], [place] [address]  I-STAT 3, arterial blood gas (G3+)     Status: Abnormal   Collection Time: 02/19/18  6:50 PM  Result Value Ref Range   pH, Arterial 7.328 (L) 7.350 - 7.450   pCO2 arterial 37.9 32.0 - 48.0 mmHg   pO2, Arterial 218.0 (H) 83.0 - 108.0 mmHg   Bicarbonate 19.8 (L) 20.0 - 28.0 mmol/L   TCO2 21 (L) 22 - 32 mmol/L   O2 Saturation 100.0 %   Acid-base deficit 6.0 (H) 0.0 - 2.0 mmol/L   Xavier Villegas temperature 98.9 F    Collection site ARTERIAL LINE    Drawn by Operator    Sample type ARTERIAL   Blood gas, arterial     Status: Abnormal   Collection Time: 02/20/18  2:52 AM  Result Value Ref Range   FIO2 40.00    Delivery systems VENTILATOR    Mode PRESSURE REGULATED VOLUME CONTROL    VT 580 mL   LHR 16 resp/min   Peep/cpap 5.0 cm H20   pH, Arterial 7.387 7.350 - 7.450   pCO2 arterial 37.0 32.0 - 48.0 mmHg   pO2, Arterial 170 (H) 83.0 - 108.0 mmHg   Bicarbonate 21.8 20.0 - 28.0  mmol/L   Acid-base deficit 2.4 (H) 0.0 - 2.0 mmol/L   O2 Saturation 99.0 %   Xavier Villegas temperature 98.6    Collection site A-LINE    Drawn by [phone]    Sample type ARTERIAL DRAW    Allens test (pass/fail) PASS PASS  Hemoglobin A1c     Status: None   Collection Time: 02/20/18  5:47 AM  Result Value Ref Range   Hgb A1c MFr Bld 4.9 4.8 - 5.6 %    Comment: (NOTE) Pre diabetes:          5.7%-6.4% Diabetes:              >6.4% Glycemic control for   <7.0% adults with diabetes    Mean Plasma Glucose 93.93 mg/dL    Comment: Performed at [facility], [address], [place], [place] [address]  Lipid panel     Status: Abnormal   Collection Time: 02/20/18  5:47 AM  Result Value Ref Range   Cholesterol 101 0 - 200 mg/dL   Triglycerides 103 <150 mg/dL  HDL 34 (L) >40 mg/dL   Total CHOL/HDL Ratio 3.0 RATIO   VLDL 21 0 - 40 mg/dL   LDL Cholesterol 46 0 - 99 mg/dL    Comment:        Total Cholesterol/HDL:CHD Risk Coronary Heart Disease Risk Table                     Men   Women  1/2 Average Risk   3.4   3.3  Average Risk       5.0   4.4  2 X Average Risk   9.6   7.1  3 X Average Risk  23.4   11.0        Use the calculated Xavier Villegas Ratio above and the CHD Risk Table to determine the Xavier Villegas's CHD Risk.        ATP III CLASSIFICATION (LDL):  <100     mg/dL   Optimal  100-129  mg/dL   Near or Above                    Optimal  130-159  mg/dL   Borderline  160-189  mg/dL   High  >190     mg/dL   Very High Performed at [facility] [address], [place], [place] [address]   CBC with Differential/Platelet     Status: Abnormal   Collection Time: 02/20/18  5:47 AM  Result Value Ref Range   WBC 19.5 (H) 4.0 - 10.5 K/uL   RBC 3.94 (L) 4.22 - 5.81 MIL/uL   Hemoglobin 15.2 13.0 - 17.0 g/dL   HCT 43.0 39.0 - 52.0 %   MCV 109.1 (H) 78.0 - 100.0 fL   MCH 38.6 (H) 26.0 - 34.0 pg   MCHC 35.3 30.0 - 36.0 g/dL   RDW 13.2 11.5 - 15.5 %   Platelets 174 150 - 400 K/uL   Neutrophils  Relative % 89 %   Neutro Abs 17.4 (H) 1.7 - 7.7 K/uL   Lymphocytes Relative 5 %   Lymphs Abs 1.0 0.7 - 4.0 K/uL   Monocytes Relative 5 %   Monocytes Absolute 0.9 0.1 - 1.0 K/uL   Eosinophils Relative 0 %   Eosinophils Absolute 0.0 0.0 - 0.7 K/uL   Basophils Relative 0 %   Basophils Absolute 0.0 0.0 - 0.1 K/uL   Immature Granulocytes 1 %   Abs Immature Granulocytes 0.1 0.0 - 0.1 K/uL    Comment: Performed at [facility], [place] [address] [place], [place] [address]  Basic metabolic panel     Status: Abnormal   Collection Time: 02/20/18  5:47 AM  Result Value Ref Range   Sodium 142 135 - 145 mmol/L   Potassium 4.0 3.5 - 5.1 mmol/L   Chloride 110 98 - 111 mmol/L    Comment: Please note change in reference range.   CO2 23 22 - 32 mmol/L   Glucose, Bld 133 (H) 70 - 99 mg/dL    Comment: Please note change in reference range.   BUN 5 (L) 6 - 20 mg/dL    Comment: Please note change in reference range.   Creatinine, Ser 0.71 0.61 - 1.24 mg/dL   Calcium 7.9 (L) 8.9 - 10.3 mg/dL   GFR calc non Af Amer >60 >60 mL/min   GFR calc Af Amer >60 >60 mL/min    Comment: (NOTE) The eGFR has been calculated using the CKD EPI equation. This calculation has not been  validated in all clinical situations. eGFR's persistently <60 mL/min signify possible Chronic Kidney Disease.    Anion gap 9 5 - 15    Comment: Performed at [facility] [address] [place], [place] [address]  TSH     Status: None   Collection Time: 02/20/18 11:27 AM  Result Value Ref Range   TSH 1.000 0.350 - 4.500 uIU/mL    Comment: Performed by a 3rd Generation assay with a functional sensitivity of <=0.01 uIU/mL. Performed at [facility], [place] [address] [place], [place] [address]   RPR     Status: None   Collection Time: 02/20/18 11:27 AM  Result Value Ref Range   RPR Ser Ql Non Reactive Non Reactive    Comment: (NOTE) Performed At: [facility] [place], [place]  [id number] Rush Farmer MD [id number]   C-reactive protein     Status: None   Collection Time: 02/20/18 11:27 AM  Result Value Ref Range   CRP <0.8 <1.0 mg/dL    Comment: Performed at [facility], [place] [address], [place], [place] [address]  Sedimentation rate     Status: None   Collection Time: 02/20/18 11:27 AM  Result Value Ref Range   Sed Rate 1 0 - 16 mm/hr    Comment: Performed at [facility] [address], [place], [place] [address]  Antithrombin III     Status: None   Collection Time: 02/20/18 11:27 AM  Result Value Ref Range   AntiThromb III Func 96 75 - 120 %    Comment: Performed at [facility] [address], [place], [place] [address]  Sodium     Status: None   Collection Time: 02/20/18 11:27 AM  Result Value Ref Range   Sodium 142 135 - 145 mmol/L    Comment: Performed at [facility] [address], [place], [place] [address]  Magnesium     Status: None   Collection Time: 02/20/18  1:17 PM  Result Value Ref Range   Magnesium 1.7 1.7 - 2.4 mg/dL    Comment: Performed at [facility] [address] [place], [place] [address]  Phosphorus     Status: None   Collection Time: 02/20/18  1:17 PM  Result Value Ref Range   Phosphorus 3.3 2.5 - 4.6 mg/dL    Comment: Performed at [facility] [address] [place], [place] [address]  Glucose, capillary     Status: None   Collection Time: 02/20/18  3:46 PM  Result Value Ref Range   Glucose-Capillary 75 70 - 99 mg/dL  Sodium     Status: None   Collection Time: 02/20/18  4:56 PM  Result Value Ref Range   Sodium 140 135 - 145 mmol/L    Comment: Performed at [facility] [address] [place], [place] [address]  Magnesium     Status: None   Collection Time: 02/20/18  4:56 PM  Result Value Ref Range   Magnesium 1.7 1.7 - 2.4 mg/dL    Comment: Performed at [facility], [place] [address] [place], [place] [address]  Phosphorus     Status: None   Collection  Time: 02/20/18  4:56 PM  Result Value Ref Range   Phosphorus 3.3 2.5 - 4.6 mg/dL    Comment: Performed at [facility], [place] [address], [place], [place] [address]  Culture, blood (Routine X  2) w Reflex to ID Panel     Status: None (Preliminary result)   Collection Time: 02/20/18  4:56 PM  Result Value Ref Range   Specimen Description BLOOD CENTRAL LINE    Special Requests      BOTTLES DRAWN AEROBIC AND ANAEROBIC Blood Culture adequate volume   Culture      NO GROWTH < 24 HOURS Performed at [facility], [place] [address] [place], [place] [address]    Report Status PENDING   Culture, blood (Routine X 2) w Reflex to ID Panel     Status: None (Preliminary result)   Collection Time: 02/20/18  5:20 PM  Result Value Ref Range   Specimen Description BLOOD CENTRAL LINE    Special Requests      BOTTLES DRAWN AEROBIC AND ANAEROBIC Blood Culture adequate volume   Culture      NO GROWTH < 24 HOURS Performed at [facility], [place] [address] [place], [place] [address]    Report Status PENDING   Glucose, capillary     Status: None   Collection Time: 02/20/18  8:11 PM  Result Value Ref Range   Glucose-Capillary 75 70 - 99 mg/dL  Glucose, capillary     Status: None   Collection Time: 02/20/18 10:58 PM  Result Value Ref Range   Glucose-Capillary 77 70 - 99 mg/dL  Sodium     Status: None   Collection Time: 02/20/18 11:02 PM  Result Value Ref Range   Sodium 145 135 - 145 mmol/L    Comment: Performed at [facility], [place] [address] [place], [place] [address]  Blood gas, arterial     Status: Abnormal   Collection Time: 02/21/18  3:33 AM  Result Value Ref Range   FIO2 40.00    O2 Content VENTILATOR L/min   Delivery systems VENTILATOR    Mode PRESSURE REGULATED VOLUME CONTROL    VT 580 mL   LHR 16 resp/min   Peep/cpap 5.0 cm H20   pH, Arterial 7.401 7.350 - 7.450   pCO2 arterial 39.5 32.0 - 48.0 mmHg   pO2, Arterial 156 (H) 83.0 - 108.0 mmHg   Bicarbonate 24.1 20.0  - 28.0 mmol/L   Acid-base deficit 0.1 0.0 - 2.0 mmol/L   O2 Saturation 99.2 %   Xavier Villegas temperature 98.6    Collection site A-LINE    Drawn by [id number]    Sample type ARTERIAL DRAW    Allens test (pass/fail) PASS PASS  Glucose, capillary     Status: None   Collection Time: 02/21/18  3:36 AM  Result Value Ref Range   Glucose-Capillary 89 70 - 99 mg/dL  I-STAT 3, arterial blood gas (G3+)     Status: Abnormal   Collection Time: 02/21/18  3:58 AM  Result Value Ref Range   pH, Arterial 7.404 7.350 - 7.450   pCO2 arterial 39.7 32.0 - 48.0 mmHg   pO2, Arterial 172.0 (H) 83.0 - 108.0 mmHg   Bicarbonate 24.8 20.0 - 28.0 mmol/L   TCO2 26 22 - 32 mmol/L   O2 Saturation 100.0 %   Xavier Villegas temperature HIDE    Collection site RADIAL, ALLEN'S TEST ACCEPTABLE    Drawn by RT    Sample type ARTERIAL   CBC     Status: Abnormal   Collection Time: 02/21/18  5:39 AM  Result Value Ref Range   WBC 14.2 (H) 4.0 - 10.5 K/uL   RBC 3.93 (L) 4.22 - 5.81 MIL/uL  Hemoglobin 15.1 13.0 - 17.0 g/dL   HCT 44.2 39.0 - 52.0 %   MCV 112.5 (H) 78.0 - 100.0 fL   MCH 38.4 (H) 26.0 - 34.0 pg   MCHC 34.2 30.0 - 36.0 g/dL   RDW 13.5 11.5 - 15.5 %   Platelets 157 150 - 400 K/uL    Comment: Performed at [facility] [address], [place], [place] [address]  Basic metabolic panel     Status: Abnormal   Collection Time: 02/21/18  5:39 AM  Result Value Ref Range   Sodium 150 (H) 135 - 145 mmol/L   Potassium 3.4 (L) 3.5 - 5.1 mmol/L   Chloride 118 (H) 98 - 111 mmol/L    Comment: Please note change in reference range.   CO2 25 22 - 32 mmol/L   Glucose, Bld 120 (H) 70 - 99 mg/dL    Comment: Please note change in reference range.   BUN 12 6 - 20 mg/dL    Comment: Please note change in reference range.   Creatinine, Ser 0.70 0.61 - 1.24 mg/dL   Calcium 8.0 (L) 8.9 - 10.3 mg/dL   GFR calc non Af Amer >60 >60 mL/min   GFR calc Af Amer >60 >60 mL/min    Comment: (NOTE) The eGFR has been calculated using the CKD  EPI equation. This calculation has not been validated in all clinical situations. eGFR's persistently <60 mL/min signify possible Chronic Kidney Disease.    Anion gap 7 5 - 15    Comment: Performed at [facility] [address], [place], [place] [address]  Magnesium     Status: None   Collection Time: 02/21/18  5:39 AM  Result Value Ref Range   Magnesium 1.8 1.7 - 2.4 mg/dL    Comment: Performed at [facility] [address], [place], [place] [address]  Phosphorus     Status: None   Collection Time: 02/21/18  5:39 AM  Result Value Ref Range   Phosphorus 2.6 2.5 - 4.6 mg/dL    Comment: Performed at [facility] [address], [place], [place] [address]  Glucose, capillary     Status: None   Collection Time: 02/21/18  8:30 AM  Result Value Ref Range   Glucose-Capillary 81 70 - 99 mg/dL   Comment 1 Notify RN    Comment 2 Document in Chart   I-STAT 3, arterial blood gas (G3+)     Status: Abnormal   Collection Time: 02/21/18  9:42 AM  Result Value Ref Range   pH, Arterial 7.373 7.350 - 7.450   pCO2 arterial 39.4 32.0 - 48.0 mmHg   pO2, Arterial 169.0 (H) 83.0 - 108.0 mmHg   Bicarbonate 23.0 20.0 - 28.0 mmol/L   TCO2 24 22 - 32 mmol/L   O2 Saturation 99.0 %   Acid-base deficit 2.0 0.0 - 2.0 mmol/L   Xavier Villegas temperature 98.2 F    Collection site RADIAL, ALLEN'S TEST ACCEPTABLE    Drawn by RT    Sample type ARTERIAL     Ct Angio Head W Or Wo Contrast  Result Date: 02/19/2018 CLINICAL DATA:  Stroke.  Right-sided weakness aphasia EXAM: CT ANGIOGRAPHY HEAD AND NECK CT PERFUSION BRAIN TECHNIQUE: Multidetector CT imaging of the head and neck was performed using the standard protocol during bolus administration of intravenous contrast. Multiplanar CT image reconstructions and MIPs were obtained to evaluate the vascular anatomy. Carotid stenosis measurements (when applicable) are obtained utilizing NASCET  criteria, using the distal internal carotid diameter as the  denominator. Multiphase CT imaging of the brain was performed following IV bolus contrast injection. Subsequent parametric perfusion maps were calculated using RAPID software. CONTRAST:  62m ISOVUE-370 IOPAMIDOL (ISOVUE-370) INJECTION 76% COMPARISON:  CT head 02/19/2018 FINDINGS: CTA NECK FINDINGS Aortic arch: Normal aortic arch without atherosclerotic disease or aneurysm. Proximal great vessels normal Right carotid system: Normal right carotid. No evidence of atherosclerotic disease dissection or stenosis Left carotid system: Irregularity and stenosis of the proximal left internal carotid artery compatible with dissection. No significant calcification. Intraluminal filling defect compatible with thrombus. Left internal carotid artery small in caliber but patent to the cavernous segment. There is occlusion of the supraclinoid internal carotid artery on the left. Vertebral arteries: Normal Skeleton: Normal.  Periapical cyst around right upper incisor Other neck: Negative for mass or adenopathy. Upper chest: Negative Review of the MIP images confirms the above findings CTA HEAD FINDINGS Anterior circulation: Slow flow with small caliber of the left cavernous carotid which then occludes in the supraclinoid component. Occlusion of the proximal left A1. Occlusion of left M1 segment. Occlusion of left M2 branches diffusely. Right middle cerebral artery widely patent. A2 segments patent bilaterally. Posterior circulation: Both vertebral arteries patent to the basilar. Left vertebral dominant. PICA patent bilaterally. Basilar patent. Superior cerebellar and posterior cerebral arteries patent bilaterally. Venous sinuses: Negative Anatomic variants: None Delayed phase: Not performed Review of the MIP images confirms the above findings CT Brain Perfusion Findings: CBF (<30%) Volume: 1364mPerfusion (Tmax>6.0s) volume: 17149mismatch Volume: 7m8mfarction Location:Left MCA territory including left basal ganglia. IMPRESSION:  Large territory left MCA acute infarct. 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 Occlusion left A1 with reconstitution left A2. Occlusion left M1 and M2 segments. Call is currently in for Dr. McNeLeonides Schanzectronically Signed   By: CharFranchot Gallo.   On: 02/19/2018 12:59   Dg Chest 1 View  Result Date: 02/19/2018 CLINICAL DATA:  36 y52r old male with history of stroke. Post procedure chest x-ray. EXAM: CHEST  1 VIEW COMPARISON:  No priors. FINDINGS: An endotracheal tube is in place with tip 3.1 cm above the carina. Nasogastric tube extends into the proximal stomach. Ill-defined opacity in the right lung base which is coincident with the in divisum tubing, potentially artifactual. Lung volumes are low. No other definite consolidative airspace disease. No pleural effusions. No evidence of pulmonary edema. Heart size is normal. Upper mediastinal contours are within normal limits. IMPRESSION: 1. Support apparatus, as above. 2. Low lung volumes without definite radiographic evidence of acute cardiopulmonary disease. Electronically Signed   By: DaniVinnie Langton.   On: 02/19/2018 16:24   Ct Head Wo Contrast  Result Date: 02/21/2018 CLINICAL DATA:  Stroke follow-up EXAM: CT HEAD WITHOUT CONTRAST TECHNIQUE: Contiguous axial images were obtained from the base of the skull through the vertex without intravenous contrast. COMPARISON:  Head CT 02/19/2018 FINDINGS: Brain: There is increased hypodensity throughout the left MCA distribution in the location of the known infarct. Hyperdense material within the infarcted region has also decreased in density, with no increased volume. Mass effect on the left lateral ventricle is increased and there is now rightward midline shift measuring 4 mm at the level of the foramina of [place]. No hydrocephalus. Right hemisphere and cerebellum are normal.  Vascular: Hyperattenuation of the left middle cerebral artery. Skull: The visualized skull base, calvarium and extracranial  soft tissues are normal. Sinuses/Orbits: No fluid levels or advanced mucosal thickening of the visualized paranasal sinuses. No mastoid or middle ear effusion. The orbits are normal. IMPRESSION: 1. Progression of cytotoxic edema throughout the left MCA distribution, causing slight mass effect on the left lateral ventricle with 4 mm of rightward midline shift. No hydrocephalus. 2. Decreased density in unchanged distribution of hyperattenuating material in the left MCA territory, favored to indicate resolving contrast staining. Electronically Signed   By: Ulyses Jarred M.D.   On: 02/21/2018 05:15   Ct Head Wo Contrast  Result Date: 02/19/2018 CLINICAL DATA:  Followup revascularization attempt. Left MCA territory infarction secondary to acute dissection of the left internal carotid artery. EXAM: CT HEAD WITHOUT CONTRAST TECHNIQUE: Contiguous axial images were obtained from the base of the skull through the vertex without intravenous contrast. COMPARISON:  Multiple examinations earlier today. FINDINGS: Brain: Developing left MCA territory infarction. Contrast staining possibly admixed with hemorrhage in the insular region. Diminished contrast staining elsewhere in the region of the infarction. Early swelling but no mass effect or shift at this time. No hydrocephalus. No extra-axial collection. Vascular: Some vascular contrast still evident. Skull: Normal Sinuses/Orbits: Clear/normal Other: None IMPRESSION: Left MCA territory infarction with early low-density and swelling but no mass effect or shift at this time. I think most the hyperdensity seen previously relates to contrast staining, much of which is diminishing or resolved. There is some persistent contrast staining in the region of the insula and I could not exclude that there is a small amount of admixed hemorrhage. Electronically Signed    By: Nelson Chimes M.D.   On: 02/19/2018 20:33   Ct Head Wo Contrast  Result Date: 02/19/2018 CLINICAL DATA:  Stroke post intervention. EXAM: CT HEAD WITHOUT CONTRAST TECHNIQUE: Contiguous axial images were obtained from the base of the skull through the vertex without intravenous contrast. COMPARISON:  CT head 02/19/2018 FINDINGS: Brain: Large territory left MCA infarct has become more evident on CT with diffuse low-density throughout the left MCA territory including the basal ganglia. Interval development of patchy areas of acute hemorrhage within the infarct involving the left frontal, left parietal, and left insular region. Chronic hemorrhage also in the left lateral basal ganglia. High density in the left posterior insula likely represents extravasation of contrast. Mild local mass-effect without midline shift. Ventricle size remains normal. No other acute infarct. Vascular: Diffuse arterial enhancement due to prior angiography in CTA Skull: Negative Sinuses/Orbits: Mild mucosal edema paranasal sinuses. Negative orbit Other: None IMPRESSION: Large territory acute infarct left MCA territory. Interval development of multiple areas of patchy hemorrhage within the infarct following clot retrieval. No midline shift. The images were reviewed with Dr. Earleen Newport. Electronically Signed   By: Franchot Gallo M.D.   On: 02/19/2018 16:18   Ct Angio Neck W Or Wo Contrast  Result Date: 02/19/2018 CLINICAL DATA:  Stroke.  Right-sided weakness aphasia EXAM: CT ANGIOGRAPHY HEAD AND NECK CT PERFUSION BRAIN TECHNIQUE: Multidetector CT imaging of the head and neck was performed using the standard protocol during bolus administration of intravenous contrast. Multiplanar CT image reconstructions and MIPs were obtained to evaluate the vascular anatomy. Carotid stenosis measurements (when applicable) are obtained utilizing NASCET criteria, using the distal internal carotid diameter as the denominator. Multiphase CT imaging of the  brain was performed following IV bolus contrast injection. Subsequent parametric perfusion maps were calculated using RAPID software. CONTRAST:  12m ISOVUE-370 IOPAMIDOL (ISOVUE-370) INJECTION 76% COMPARISON:  CT head 02/19/2018 FINDINGS: CTA NECK FINDINGS Aortic arch:  Normal aortic arch without atherosclerotic disease or aneurysm. Proximal great vessels normal Right carotid system: Normal right carotid. No evidence of atherosclerotic disease dissection or stenosis Left carotid system: Irregularity and stenosis of the proximal left internal carotid artery compatible with dissection. No significant calcification. Intraluminal filling defect compatible with thrombus. Left internal carotid artery small in caliber but patent to the cavernous segment. There is occlusion of the supraclinoid internal carotid artery on the left. Vertebral arteries: Normal Skeleton: Normal.  Periapical cyst around right upper incisor Other neck: Negative for mass or adenopathy. Upper chest: Negative Review of the MIP images confirms the above findings CTA HEAD FINDINGS Anterior circulation: Slow flow with small caliber of the left cavernous carotid which then occludes in the supraclinoid component. Occlusion of the proximal left A1. Occlusion of left M1 segment. Occlusion of left M2 branches diffusely. Right middle cerebral artery widely patent. A2 segments patent bilaterally. Posterior circulation: Both vertebral arteries patent to the basilar. Left vertebral dominant. PICA patent bilaterally. Basilar patent. Superior cerebellar and posterior cerebral arteries patent bilaterally. Venous sinuses: Negative Anatomic variants: None Delayed phase: Not performed Review of the MIP images confirms the above findings CT Brain Perfusion Findings: CBF (<30%) Volume: 173m Perfusion (Tmax>6.0s) volume: 1713mMismatch Volume: 4041mnfarction Location:Left MCA territory including left basal ganglia. IMPRESSION: Large territory left MCA acute infarct.  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 Occlusion left A1 with reconstitution left A2. Occlusion left M1 and M2 segments. Call is currently in for Dr. McNLeonides Schanzlectronically Signed   By: ChaFranchot GalloD.   On: 02/19/2018 12:59   Mr MraJodene Namad Wo Contrast  Result Date: 02/20/2018 CLINICAL DATA:  Stroke post left MCA clot retrieval. Left carotid dissection EXAM: MRI HEAD WITHOUT CONTRAST MRA HEAD WITHOUT CONTRAST TECHNIQUE: Multiplanar, multiecho pulse sequences of the brain and surrounding structures were obtained without intravenous contrast. Angiographic images of the head were obtained using MRA technique without contrast. COMPARISON:  CT head 02/19/2018 FINDINGS: MRI HEAD FINDINGS Brain: Large territory acute infarct left MCA territory. Acute infarct throughout the left basal ganglia and entire left MCA territory. Mild amount of hemorrhage in the left frontal and parietal lobe. Diffuse edema with mass-effect and 2 mm midline shift to the right. No other areas of infarct hemorrhage or mass. Vascular: Thrombosed left internal carotid artery. Normal flow void right internal carotid artery and posterior circulation Skull and upper cervical spine: Negative Sinuses/Orbits: Negative Other: None MRA HEAD FINDINGS Left internal carotid artery is occluded through the cervical and cavernous segment. Left middle cerebral artery is occluded including the left M1 M2 and M3 segments. Left anterior cerebral artery is patent presumably supplied from the right. Right internal carotid artery widely patent. Right anterior and middle cerebral arteries normal. Both vertebral arteries patent to the basilar. PICA patent bilaterally. Basilar widely patent. Superior cerebellar and posterior cerebral arteries normal bilaterally Negative for cerebral aneurysm IMPRESSION: Acute infarct of the entire left MCA  territory with mild associated hemorrhage in the left parietal and frontal lobe. Diffuse edema. 2 mm midline shift to the right. No other infarct Occlusion of the left internal carotid artery and entire left middle cerebral artery Electronically Signed   By: ChaFranchot GalloD.   On: 02/20/2018 10:52   Mr Brain Wo Contrast  Result Date: 02/20/2018 CLINICAL DATA:  Stroke post left MCA clot retrieval. Left carotid dissection EXAM: MRI HEAD WITHOUT CONTRAST  MRA HEAD WITHOUT CONTRAST TECHNIQUE: Multiplanar, multiecho pulse sequences of the brain and surrounding structures were obtained without intravenous contrast. Angiographic images of the head were obtained using MRA technique without contrast. COMPARISON:  CT head 02/19/2018 FINDINGS: MRI HEAD FINDINGS Brain: Large territory acute infarct left MCA territory. Acute infarct throughout the left basal ganglia and entire left MCA territory. Mild amount of hemorrhage in the left frontal and parietal lobe. Diffuse edema with mass-effect and 2 mm midline shift to the right. No other areas of infarct hemorrhage or mass. Vascular: Thrombosed left internal carotid artery. Normal flow void right internal carotid artery and posterior circulation Skull and upper cervical spine: Negative Sinuses/Orbits: Negative Other: None MRA HEAD FINDINGS Left internal carotid artery is occluded through the cervical and cavernous segment. Left middle cerebral artery is occluded including the left M1 M2 and M3 segments. Left anterior cerebral artery is patent presumably supplied from the right. Right internal carotid artery widely patent. Right anterior and middle cerebral arteries normal. Both vertebral arteries patent to the basilar. PICA patent bilaterally. Basilar widely patent. Superior cerebellar and posterior cerebral arteries normal bilaterally Negative for cerebral aneurysm IMPRESSION: Acute infarct of the entire left MCA territory with mild associated hemorrhage in the left  parietal and frontal lobe. Diffuse edema. 2 mm midline shift to the right. No other infarct Occlusion of the left internal carotid artery and entire left middle cerebral artery Electronically Signed   By: Franchot Gallo M.D.   On: 02/20/2018 10:52   [place]  Result Date: 02/20/2018 INDICATION: 37 year old male with a history of acute left MCA stroke Given the Xavier Villegas's age and baseline function, thrombectomy was offered to the Xavier Villegas's family as a possibility of improving his overall function and giving him the best chance of recovery, accepting a high risk of intracranial hemorrhage/malignant edema given the size of the infarction. EXAM: ULTRASOUND GUIDED ACCESS RIGHT COMMON FEMORAL ARTERY CEREBRAL ANGIOGRAM MECHANICAL THROMBECTOMY LEFT MCA CLOSURE OF ACCESS SITE WITH ANGIO-SEAL DEVICE COMPARISON:  CT 02/19/2018, CT ANGIOGRAM 02/19/2018 MEDICATIONS: 2 g Ancef. The antibiotic was administered within 1 hour of the procedure ANESTHESIA/SEDATION: GENERAL ENDOTRACHEAL TUBE ANESTHESIA WITH THE ANESTHESIA TEAM CONTRAST:  140 cc Isovue FLUOROSCOPY TIME:  Fluoroscopy Time: 35 minutes 6 seconds (2373 mGy). COMPLICATIONS: None TECHNIQUE: Informed written consent was obtained from the Xavier Villegas's family after a thorough discussion of the procedural risks, benefits and alternatives. Specific risks discussed include: Bleeding, infection, contrast reaction, kidney injury/failure, need for further procedure/surgery, arterial injury or dissection, embolization to new territory, intracranial hemorrhage (10-15% risk), neurologic deterioration, cardiopulmonary collapse, death. All questions were addressed. Maximal Sterile Barrier Technique was utilized including during the procedure including caps, mask, sterile gowns, sterile gloves, sterile drape, hand hygiene and skin antiseptic. A timeout was performed prior to the initiation of the procedure. The anesthesia team was present to provide general endotracheal tube  anesthesia and for Xavier Villegas monitoring during the procedure. Interventional neuro radiology nursing staff was also present. FINDINGS: Ultrasound images were performed of the right inguinal region, confirming patency of the right common femoral artery. Images were stored and sent to PACs. Initial Findings: Left common carotid artery:  Normal course caliber and contour. Left external carotid artery: Patent with antegrade flow. Left internal carotid artery: There is irregular appearance of the posterior proximal internal carotid artery extending from the carotid bulb along the proximal 3rd of the right internal carotid artery. This results in at least 50 percent narrowing, with irregular blunted appearance on the distal filling defect,  compatible with thrombus/dissection. The mid and distal cervical segment of the ICA is patent. Unremarkable petrous segment, lacerum segment, cavernous segment, clinoid segment/ophthalmic segment and terminal segment. Left MCA: MCA occluded at the origin with a reversed meniscus suggesting acute thrombus. Patent anterior choroidal artery and a small posterior communicating artery, which washes out from collateral flow from the posterior. The initial injection demonstrates patent meningo-hypophyseal branch, with sellar blush. Left ACA: A 1 segment patent. A 2 segment perfuses the left territory. Patent anterior communicating artery with transient perfusion of the right ACA territory. Completion Findings: Left MCA: After mechanical thrombectomy, there is reperfusion of the middle cerebral artery, with slow flow through the insular branches, TICI 2b. Early venous drainage is visualized. Upon withdrawal into the common carotid artery, there is occlusion of the proximal cervical ICA secondary to acute dissection/thrombus. External carotid artery remains patent, with early perfusion changes of external to internal collateralization, as there is filling of the carotid siphon via ascending  cervical. Right common carotid artery:  Normal course caliber and contour. Right external carotid artery: Patent with antegrade flow. Right internal carotid artery: Normal course caliber and contour of the cervical portion. Vertical and petrous segment patent with normal course caliber contour. Lacerum segment patent. cavernous segment patent. Clinoid segment patent. Antegrade flow of the ophthalmic artery. Ophthalmic segment patent. Terminus patent. Crossover flow into the left hemisphere, with perfusion of both the ACA territory and the MCA territory at the conclusion. Right MCA: M1 segment patent. Insular and opercular segments patent. Unremarkable caliber and course of the cortical segments. Typical arterial, capillary/ parenchymal, and venous phase. Right ACA: A 1 segment patent. A 2 segment perfuses the right territory. Right vertebral: Cervical vertebral artery have normal course caliber and contour with expected filling of the C3, C2, C1 branches. The dominant runoff of the right vertebral artery is into a right posterior inferior cerebellar artery, with a small right vertebral artery to basilar contribution. The majority of flow into the basilar from the right is washed out from a left vertebral artery inflow. Left vertebral: Normal course caliber and contour of left vertebral artery. Dominant left vertebral artery. Expected filling of the C3, C2, C1 branches. Dominant vertebral artery contributes to basilar artery, with patent left posterior inferior cerebellar artery, patent bilateral superior cerebellar artery, and patent bilateral posterior cerebral artery, with symmetric opacification from the left vertebral injection. The left posterior cerebral artery contributes to perfusion of the left temporal lobe and occipital lobe. Flat panel CT: CT demonstrates no significant mass effect, however, at the conclusion there is evidence of contrast staining of the left MCA territory gyri as well as a combination  of ICH/SAH at the left basal ganglia and sylvian fissure. Evidence of involving left MCA infarction. PROCEDURE: Xavier Villegas is brought emergently to the neuro angiography suite, with the Xavier Villegas identified appropriately and placed supine position on the table. Left radial arterial line was placed by the anesthesia team. The Xavier Villegas is then prepped and draped in the usual sterile fashion. Ultrasound survey of the right inguinal region was performed with images stored and sent to PACs, confirming patency of the common femoral artery. 11 blade scalpel was used to make a small incision. Blunt dissection was performed. A micropuncture needle was used access the right common femoral artery under ultrasound. With excellent arterial blood flow returned, an .018 micro wire was passed through the needle, observed to enter the abdominal aorta under fluoroscopy. The needle was removed, and a micropuncture sheath was placed over the  wire. The inner dilator and wire were removed, and an 035 Bentson wire was advanced under fluoroscopy into the abdominal aorta. The sheath was removed and a standard 5 [place] vascular sheath was placed. The dilator was removed and the sheath was flushed. A 54F JB-1 diagnostic catheter was advanced over the wire to the proximal descending thoracic aorta. Wire was then removed. Double flush of the catheter was performed. Catheter was then used to select the left common carotid artery. Formal angiogram was performed of the cervical and cerebral vessels. Glidewire was then used to navigate across the dissected proximal cervical ICA. Diagnostic catheter was then advanced into the distal cervical ICA. Exchange length Rosen wire was then passed through the diagnostic catheter to the distal cervical ICA and the diagnostic catheter was removed. The 5 French sheath was removed and exchanged for 8 French 55 centimeter BrightTip sheath. Sheath was flushed and attached to pressurized and heparinized saline bag for  constant forward flow. Then a neuron Max 80cm catheter was prepared on the back table. Ace 68 intermediate catheter was then loaded though the Neuron Max catheter with coaxial microcatheter, and advanced through the guide catheter. Under roadmap angiogram, Microcatheter system was then introduced through the Ace catheter using a synchro soft 014 wire and a Trevo Provue18 catheter. Microcatheter system was advanced into the internal carotid artery, to the level of the occlusion. The micro wire and the microcatheter were carefully placed at the proximal occlusion, and the aspiration catheter was advanced to the occlusion. Microwire in the microcatheter were withdrawn, an aspiration was performed. Aspiration was initiated, confirming return of blood flow. The catheter was then advanced into the occlusion with stasis of blood flow returned. With no blood flow returned after 30 seconds-60 seconds of aspiration, the aspiration catheter was removed entirely from the neuron max. This catheter was then flushed, with clot retrieved. Repeat angiogram performed. Further targets with thrombus were identified within M2 branches. The aspiration catheter was then used as a intermediate catheter, with the microcatheter advanced with the synchro wire. Under road map angiogram, the microcatheter and microwire were advanced beyond the M2 thrombus. The wire was removed and gentle aspiration was performed confirming blood flow returned. Gentle injection was performed to prove intraluminal location. A rotating hemostatic valve was then attached to the back end of the microcatheter, and a pressurized and heparinized saline bag was attached to the catheter. 4 x 40 solitaire device was then selected. Back flush was achieved at the rotating hemostatic valve, and then the device was gently advanced through the microcatheter to the distal end. The retriever was then unsheathed by withdrawing the microcatheter under fluoroscopy. Once the  retriever was completely unsheathed, control angiogram was performed from the balloon catheter. Constant aspiration was then performed through the Ace catheter as the retriever was gently and slowly withdrawn with fluoroscopic observation. Once the retriever was entirely removed from the system, free aspiration was confirmed at the hub of the intermediate catheter, with free blood return confirmed. Control angiogram was then performed. Persistent occlusion at the proximal M2 branch was confirmed. The microcatheter system was then again advanced through the Ace catheter. Once the micro wire microcatheter were beyond the M2 occlusion, the micro wire was removed and solitaire 4 x 40 device was deployed. After the solitaire was deployed across the occlusion, the Ace catheter was advanced into the M1 segment at the site of the occlusion. Local aspiration was performed upon withdrawal of the solitaire device under fluoroscopic observation. Once the retrieve  her was entirely removed from the system, free aspiration was confirmed at the hub of the intermediate catheter, with free blood return confirmed. Ace aspiration catheter was removed. Control angiogram was again performed. 035 rose in wire was then advanced through the neuron max into the cervical segment. The neuron max was withdrawn proximal to the bifurcation with repeat angiogram performed. At this point, the results and evolution of the case as well as the Xavier Villegas management was thoroughly discussed with the neurology team. We elected at this time to perform flat panel CT. After the results demonstrated presence of subarachnoid/ICH, we elected not to place a carotid stent. The neuro on max was removed. Completion cerebral angiogram was then performed of the right carotid system and the bilateral vertebral arteries. Catheter was removed, and the bright tip sheath was exchanged for a short 8 French sheath at the right common femoral artery. Eight [place] Angio-Seal  was deployed for hemostasis. Xavier Villegas tolerated the procedure well and remained hemodynamically stable throughout. No complications were encountered. IMPRESSION: Status post ultrasound guided access right common femoral artery for cerebral angiogram and mechanical thrombectomy of left MCA ELVO, achieving TICI 2b reperfusion after 3 passes with a combination of ADAPT technique and SOLUMBRA technique using a 4x40 solitaire. Status post deployment of Angio-Seal for hemostasis at the access site. Cerebral angiogram confirms acute left ICA dissection as the source of the intracranial emboli, with cervical ICA occluded at the case conclusion. Signed, Dulcy Fanny. Dellia Nims, RPVI [facility] PLAN: Given the presence of hemorrhage, the Xavier Villegas will stay intubated. Straight to CT for baseline CT scan. Right common femoral artery has been closed with Angio-Seal. Local wound care and right hip straight for 4 hours. The Xavier Villegas will be admitted to the neuro ICU. Target blood pressure goal of less than 818 systolic Electronically Signed   By: Corrie Mckusick D.O.   On: 02/20/2018 14:20   Ir US Guide Vasc Access Right  Result Date: 02/21/2018 INDICATION: 37 year old male with a history of acute left MCA stroke  Given the Xavier Villegas's age and baseline function, thrombectomy was offered to the Xavier Villegas's family as a possibility of improving his overall function and giving him the best chance of recovery, accepting a high risk of intracranial hemorrhage/malignant edema given the size of the infarction.  EXAM: ULTRASOUND GUIDED ACCESS RIGHT COMMON FEMORAL ARTERY  CEREBRAL ANGIOGRAM  MECHANICAL THROMBECTOMY LEFT MCA  CLOSURE OF ACCESS SITE WITH ANGIO-SEAL DEVICE  COMPARISON:  CT 02/19/2018, CT ANGIOGRAM 02/19/2018  MEDICATIONS: 2 g Ancef. The antibiotic was administered within 1 hour of the procedure  ANESTHESIA/SEDATION: GENERAL ENDOTRACHEAL TUBE ANESTHESIA WITH THE  ANESTHESIA TEAM  CONTRAST:  140 cc Isovue  FLUOROSCOPY TIME:  Fluoroscopy Time: 35 minutes 6 seconds (2373 mGy).  COMPLICATIONS: None  TECHNIQUE: Informed written consent was obtained from the Xavier Villegas's family after a thorough discussion of the procedural risks, benefits and alternatives. Specific risks discussed include: Bleeding, infection, contrast reaction, kidney injury/failure, need for further procedure/surgery, arterial injury or dissection, embolization to new territory, intracranial hemorrhage (10-15% risk), neurologic deterioration, cardiopulmonary collapse, death. All questions were addressed. Maximal Sterile Barrier Technique was utilized including during the procedure including caps, mask, sterile gowns, sterile gloves, sterile drape, hand hygiene and skin antiseptic. A timeout was performed prior to the initiation of the procedure.  The anesthesia team was present to provide general endotracheal tube anesthesia and for Xavier Villegas monitoring during the procedure. Interventional neuro radiology nursing staff was also present.  FINDINGS: Ultrasound  images were performed of the right inguinal region, confirming patency of the right common femoral artery. Images were stored and sent to PACs.  Initial Findings:  Left common carotid artery:  Normal course caliber and contour.  Left external carotid artery: Patent with antegrade flow.  Left internal carotid artery: There is irregular appearance of the posterior proximal internal carotid artery extending from the carotid bulb along the proximal 3rd of the right internal carotid artery. This results in at least 50 percent narrowing, with irregular blunted appearance on the distal filling defect, compatible with thrombus/dissection.  The mid and distal cervical segment of the ICA is patent. Unremarkable petrous segment, lacerum segment, cavernous segment, clinoid segment/ophthalmic segment and terminal segment.  Left MCA: MCA occluded at the origin with  a reversed meniscus suggesting acute thrombus. Patent anterior choroidal artery and a small posterior communicating artery, which washes out from collateral flow from the posterior. The initial injection demonstrates patent meningo-hypophyseal branch, with sellar blush.  Left ACA: A 1 segment patent. A 2 segment perfuses the left territory. Patent anterior communicating artery with transient perfusion of the right ACA territory.  Completion Findings:  Left MCA: After mechanical thrombectomy, there is reperfusion of the middle cerebral artery, with slow flow through the insular branches, TICI 2b.  Early venous drainage is visualized.  Upon withdrawal into the common carotid artery, there is occlusion of the proximal cervical ICA secondary to acute dissection/thrombus. External carotid artery remains patent, with early perfusion changes of external to internal collateralization, as there is filling of the carotid siphon via ascending cervical.  Right common carotid artery:  Normal course caliber and contour.  Right external carotid artery: Patent with antegrade flow.  Right internal carotid artery: Normal course caliber and contour of the cervical portion. Vertical and petrous segment patent with normal course caliber contour. Lacerum segment patent. cavernous segment patent. Clinoid segment patent. Antegrade flow of the ophthalmic artery. Ophthalmic segment patent. Terminus patent. Crossover flow into the left hemisphere, with perfusion of both the ACA territory and the MCA territory at the conclusion.  Right MCA: M1 segment patent. Insular and opercular segments patent. Unremarkable caliber and course of the cortical segments. Typical arterial, capillary/ parenchymal, and venous phase.  Right ACA: A 1 segment patent. A 2 segment perfuses the right territory.  Right vertebral:  Cervical vertebral artery have normal course caliber and contour with expected filling of the C3, C2, C1 branches. The dominant  runoff of the right vertebral artery is into a right posterior inferior cerebellar artery, with a small right vertebral artery to basilar contribution. The majority of flow into the basilar from the right is washed out from a left vertebral artery inflow.  Left vertebral:  Normal course caliber and contour of left vertebral artery. Dominant left vertebral artery. Expected filling of the C3, C2, C1 branches.  Dominant vertebral artery contributes to basilar artery, with patent left posterior inferior cerebellar artery, patent bilateral superior cerebellar artery, and patent bilateral posterior cerebral artery, with symmetric opacification from the left vertebral injection. The left posterior cerebral artery contributes to perfusion of the left temporal lobe and occipital lobe.  Flat panel CT:  CT demonstrates no significant mass effect, however, at the conclusion there is evidence of contrast staining of the left MCA territory gyri as well as a combination of ICH/SAH at the left basal ganglia and sylvian fissure. Evidence of involving left MCA infarction.  PROCEDURE: Xavier Villegas is brought emergently to the neuro angiography suite, with the Xavier Villegas identified appropriately  and placed supine position on the table. Left radial arterial line was placed by the anesthesia team.  The Xavier Villegas is then prepped and draped in the usual sterile fashion.  Ultrasound survey of the right inguinal region was performed with images stored and sent to PACs, confirming patency of the common femoral artery.  11 blade scalpel was used to make a small incision. Blunt dissection was performed. A micropuncture needle was used access the right common femoral artery under ultrasound. With excellent arterial blood flow returned, an .018 micro wire was passed through the needle, observed to enter the abdominal aorta under fluoroscopy. The needle was removed, and a micropuncture sheath was placed over the wire. The inner dilator and wire  were removed, and an 035 Bentson wire was advanced under fluoroscopy into the abdominal aorta. The sheath was removed and a standard 5 [place] vascular sheath was placed. The dilator was removed and the sheath was flushed.  A 79F JB-1 diagnostic catheter was advanced over the wire to the proximal descending thoracic aorta. Wire was then removed. Double flush of the catheter was performed. Catheter was then used to select the left common carotid artery.  Formal angiogram was performed of the cervical and cerebral vessels.  Glidewire was then used to navigate across the dissected proximal cervical ICA. Diagnostic catheter was then advanced into the distal cervical ICA.  Exchange length Rosen wire was then passed through the diagnostic catheter to the distal cervical ICA and the diagnostic catheter was removed. The 5 French sheath was removed and exchanged for 8 French 55 centimeter BrightTip sheath. Sheath was flushed and attached to pressurized and heparinized saline bag for constant forward flow. Then a neuron Max 80cm catheter was prepared on the back table.  Ace 68 intermediate catheter was then loaded though the Neuron Max catheter with coaxial microcatheter, and advanced through the guide catheter.  Under roadmap angiogram, Microcatheter system was then introduced through the Ace catheter using a synchro soft 014 wire and a Trevo Provue18 catheter. Microcatheter system was advanced into the internal carotid artery, to the level of the occlusion. The micro wire and the microcatheter were carefully placed at the proximal occlusion, and the aspiration catheter was advanced to the occlusion. Microwire in the microcatheter were withdrawn, an aspiration was performed.  Aspiration was initiated, confirming return of blood flow. The catheter was then advanced into the occlusion with stasis of blood flow returned.  With no blood flow returned after 30 seconds-60 seconds of aspiration, the aspiration catheter was  removed entirely from the neuron max. This catheter was then flushed, with clot retrieved.  Repeat angiogram performed. Further targets with thrombus were identified within M2 branches.  The aspiration catheter was then used as a intermediate catheter, with the microcatheter advanced with the synchro wire. Under road map angiogram, the microcatheter and microwire were advanced beyond the M2 thrombus. The wire was removed and gentle aspiration was performed confirming blood flow returned. Gentle injection was performed to prove intraluminal location.  A rotating hemostatic valve was then attached to the back end of the microcatheter, and a pressurized and heparinized saline bag was attached to the catheter.  4 x 40 solitaire device was then selected. Back flush was achieved at the rotating hemostatic valve, and then the device was gently advanced through the microcatheter to the distal end. The retriever was then unsheathed by withdrawing the microcatheter under fluoroscopy. Once the retriever was completely unsheathed, control angiogram was performed from the balloon catheter.  Constant aspiration  was then performed through the Ace catheter as the retriever was gently and slowly withdrawn with fluoroscopic observation. Once the retriever was entirely removed from the system, free aspiration was confirmed at the hub of the intermediate catheter, with free blood return confirmed.  Control angiogram was then performed. Persistent occlusion at the proximal M2 branch was confirmed.  The microcatheter system was then again advanced through the Ace catheter.  Once the micro wire microcatheter were beyond the M2 occlusion, the micro wire was removed and solitaire 4 x 40 device was deployed. After the solitaire was deployed across the occlusion, the Ace catheter was advanced into the M1 segment at the site of the occlusion.  Local aspiration was performed upon withdrawal of the solitaire device under fluoroscopic  observation. Once the retrieve her was entirely removed from the system, free aspiration was confirmed at the hub of the intermediate catheter, with free blood return confirmed.  Ace aspiration catheter was removed.  Control angiogram was again performed.  035 rose in wire was then advanced through the neuron max into the cervical segment. The neuron max was withdrawn proximal to the bifurcation with repeat angiogram performed.  At this point, the results and evolution of the case as well as the Xavier Villegas management was thoroughly discussed with the neurology team. We elected at this time to perform flat panel CT. After the results demonstrated presence of subarachnoid/ICH, we elected not to place a carotid stent.  The neuro on max was removed.  Completion cerebral angiogram was then performed of the right carotid system and the bilateral vertebral arteries.  Catheter was removed, and the bright tip sheath was exchanged for a short 8 French sheath at the right common femoral artery.  Eight [place] Angio-Seal was deployed for hemostasis.  Xavier Villegas tolerated the procedure well and remained hemodynamically stable throughout.  No complications were encountered.  IMPRESSION: Status post ultrasound guided access right common femoral artery for cerebral angiogram and mechanical thrombectomy of left MCA ELVO, achieving TICI 2b reperfusion after 3 passes with a combination of ADAPT technique and SOLUMBRA technique using a 4x40 solitaire.  Status post deployment of Angio-Seal for hemostasis at the access site.  Cerebral angiogram confirms acute left ICA dissection as the source of the intracranial emboli, with cervical ICA occluded at the case conclusion.  Signed,  Dulcy Fanny. Dellia Nims, RPVI  [facility]  [facility]  PLAN: Given the presence of hemorrhage, the Xavier Villegas will stay intubated.  Straight to CT for baseline CT scan.  Right common femoral artery has been  closed with Angio-Seal. Local wound care and right hip straight for 4 hours.  The Xavier Villegas will be admitted to the neuro ICU.  Target blood pressure goal of less than 756 systolic   Electronically Signed   By: Corrie Mckusick D.O.   On: 02/20/2018 14:20  Ct Cerebral Perfusion W Contrast  Result Date: 02/19/2018 CLINICAL DATA:  Stroke.  Right-sided weakness aphasia EXAM: CT ANGIOGRAPHY HEAD AND NECK CT PERFUSION BRAIN TECHNIQUE: Multidetector CT imaging of the head and neck was performed using the standard protocol during bolus administration of intravenous contrast. Multiplanar CT image reconstructions and MIPs were obtained to evaluate the vascular anatomy. Carotid stenosis measurements (when applicable) are obtained utilizing NASCET criteria, using the distal internal carotid diameter as the denominator. Multiphase CT imaging of the brain was performed following IV bolus contrast injection. Subsequent parametric perfusion maps were calculated using RAPID software. CONTRAST:  49m ISOVUE-370 IOPAMIDOL (ISOVUE-370) INJECTION 76% COMPARISON:  CT head 02/19/2018 FINDINGS: CTA NECK FINDINGS Aortic arch: Normal aortic arch without atherosclerotic disease or aneurysm. Proximal great vessels normal Right carotid system: Normal right carotid. No evidence of atherosclerotic disease dissection or stenosis Left carotid system: Irregularity and stenosis of the proximal left internal carotid artery compatible with dissection. No significant calcification. Intraluminal filling defect compatible with thrombus. Left internal carotid artery small in caliber but patent to the cavernous segment. There is occlusion of the supraclinoid internal carotid artery on the left. Vertebral arteries: Normal Skeleton: Normal.  Periapical cyst around right upper incisor Other neck: Negative for mass or adenopathy. Upper chest: Negative Review of the MIP images confirms the above findings CTA HEAD FINDINGS Anterior circulation: Slow flow with  small caliber of the left cavernous carotid which then occludes in the supraclinoid component. Occlusion of the proximal left A1. Occlusion of left M1 segment. Occlusion of left M2 branches diffusely. Right middle cerebral artery widely patent. A2 segments patent bilaterally. Posterior circulation: Both vertebral arteries patent to the basilar. Left vertebral dominant. PICA patent bilaterally. Basilar patent. Superior cerebellar and posterior cerebral arteries patent bilaterally. Venous sinuses: Negative Anatomic variants: None Delayed phase: Not performed Review of the MIP images confirms the above findings CT Brain Perfusion Findings: CBF (<30%) Volume: 1106m Perfusion (Tmax>6.0s) volume: 1773mMismatch Volume: 4084mnfarction Location:Left MCA territory including left basal ganglia. IMPRESSION: Large territory left MCA acute infarct. 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 Occlusion left A1 with reconstitution left A2. Occlusion left M1 and M2 segments. Call is currently in for Dr. McNLeonides Schanzlectronically Signed   By: ChaFranchot GalloD.   On: 02/19/2018 12:59   Dg Chest Port 1 View  Result Date: 02/21/2018 CLINICAL DATA:  Hypoxia EXAM: PORTABLE CHEST 1 VIEW COMPARISON:  February 20, 2018 FINDINGS: Endotracheal tube tip is 4.0 cm above the carina. Nasogastric tube tip and side port are below the diaphragm. Central catheter tip is at the cavoatrial junction. No pneumothorax. There is atelectatic change in the lung bases. Lungs elsewhere clear. Heart is upper normal in size with pulmonary vascularity normal. No adenopathy. No bone lesions. IMPRESSION: Tube and catheter positions as described without pneumothorax. Lower lobe atelectatic change. No edema or consolidation. Stable cardiac silhouette. Electronically Signed   By: WilLowella GripI M.D.   On: 02/21/2018 07:25   Dg Chest Port  1 View  Result Date: 02/20/2018 CLINICAL DATA:  Respiratory failure,central line placement EXAM: PORTABLE CHEST 1 VIEW COMPARISON:  02/20/2018 FINDINGS: Endotracheal tube is in place, tip approximately 7.1 centimeters above the carina. Nasogastric tube is in place, tip beyond the level of the mid stomach. A LEFT IJ central line tip overlies the superior vena cava and is new since the previous exam. There is no pneumothorax. Heart size is normal. Mild subsegmental atelectasis in the MEDIAL LEFT lung base. Remote RIGHT rib fracture. IMPRESSION: Interval placement of LEFT IJ central line.  No pneumothorax. LEFT LOWER lobe atelectasis. Electronically Signed   By: EliNolon NationsD.   On: 02/20/2018 16:50   Dg Chest Port 1 View  Result Date: 02/20/2018 CLINICAL DATA:  Endotracheal tube placement EXAM: PORTABLE CHEST 1 VIEW COMPARISON:  02/20/2018 FINDINGS: Endotracheal tube 6.5 cm above the carina unchanged from earlier today. NG tube enters the stomach with side hole in the distal esophagus similar to the prior study. Mild bibasilar atelectasis similar to the prior study. Negative for edema  or effusion IMPRESSION: Endotracheal tube 6.5 cm above the carina unchanged NG tube in the distal esophagus unchanged Mild bibasilar atelectasis unchanged Electronically Signed   By: Franchot Gallo M.D.   On: 02/20/2018 15:34   Dg Chest Port 1 View  Result Date: 02/20/2018 CLINICAL DATA:  37 year old male with respiratory failure status post left internal carotid artery dissection and left MCA territory infarct. EXAM: PORTABLE CHEST 1 VIEW COMPARISON:  Prior chest x-ray 02/19/2018 FINDINGS: The Xavier Villegas is intubated. The tip of the endotracheal tube is 6.8 cm above the carina. A nasogastric tube is present. The tip of the tube overlies the GE junction and is not within the stomach. Low inspiratory volumes with perhaps minimal bibasilar atelectasis. The lungs are otherwise clear. Remote healed right seventh rib fracture.  IMPRESSION: 1. The tip of the nasogastric tube overlies the gastroesophageal junction and is not within the stomach. If the intends to decompress the stomach, recommend advancing 10 cm. 2. Low inspiratory volumes with minimal bibasilar atelectasis. 3. The tip of the endotracheal tube is 6.7 cm above the carina. These results will be called to the ordering clinician or representative by the Radiologist Assistant, and communication documented in the PACS or zVision Dashboard. Electronically Signed   By: Jacqulynn Cadet M.D.   On: 02/20/2018 08:55   Ir Percutaneous Art Thrombectomy/infusion Intracranial Inc Diag Angio  Result Date: 02/20/2018 INDICATION: 37 year old male with a history of acute left MCA stroke Given the Xavier Villegas's age and baseline function, thrombectomy was offered to the Xavier Villegas's family as a possibility of improving his overall function and giving him the best chance of recovery, accepting a high risk of intracranial hemorrhage/malignant edema given the size of the infarction. EXAM: ULTRASOUND GUIDED ACCESS RIGHT COMMON FEMORAL ARTERY CEREBRAL ANGIOGRAM MECHANICAL THROMBECTOMY LEFT MCA CLOSURE OF ACCESS SITE WITH ANGIO-SEAL DEVICE COMPARISON:  CT 02/19/2018, CT ANGIOGRAM 02/19/2018 MEDICATIONS: 2 g Ancef. The antibiotic was administered within 1 hour of the procedure ANESTHESIA/SEDATION: GENERAL ENDOTRACHEAL TUBE ANESTHESIA WITH THE ANESTHESIA TEAM CONTRAST:  140 cc Isovue FLUOROSCOPY TIME:  Fluoroscopy Time: 35 minutes 6 seconds (2373 mGy). COMPLICATIONS: None TECHNIQUE: Informed written consent was obtained from the Xavier Villegas's family after a thorough discussion of the procedural risks, benefits and alternatives. Specific risks discussed include: Bleeding, infection, contrast reaction, kidney injury/failure, need for further procedure/surgery, arterial injury or dissection, embolization to new territory, intracranial hemorrhage (10-15% risk), neurologic deterioration, cardiopulmonary collapse,  death. All questions were addressed. Maximal Sterile Barrier Technique was utilized including during the procedure including caps, mask, sterile gowns, sterile gloves, sterile drape, hand hygiene and skin antiseptic. A timeout was performed prior to the initiation of the procedure. The anesthesia team was present to provide general endotracheal tube anesthesia and for Xavier Villegas monitoring during the procedure. Interventional neuro radiology nursing staff was also present. FINDINGS: Ultrasound images were performed of the right inguinal region, confirming patency of the right common femoral artery. Images were stored and sent to PACs. Initial Findings: Left common carotid artery:  Normal course caliber and contour. Left external carotid artery: Patent with antegrade flow. Left internal carotid artery: There is irregular appearance of the posterior proximal internal carotid artery extending from the carotid bulb along the proximal 3rd of the right internal carotid artery. This results in at least 50 percent narrowing, with irregular blunted appearance on the distal filling defect, compatible with thrombus/dissection. The mid and distal cervical segment of the ICA is patent. Unremarkable petrous segment, lacerum segment, cavernous segment, clinoid segment/ophthalmic segment and terminal segment. Left MCA:  MCA occluded at the origin with a reversed meniscus suggesting acute thrombus. Patent anterior choroidal artery and a small posterior communicating artery, which washes out from collateral flow from the posterior. The initial injection demonstrates patent meningo-hypophyseal branch, with sellar blush. Left ACA: A 1 segment patent. A 2 segment perfuses the left territory. Patent anterior communicating artery with transient perfusion of the right ACA territory. Completion Findings: Left MCA: After mechanical thrombectomy, there is reperfusion of the middle cerebral artery, with slow flow through the insular branches, TICI  2b. Early venous drainage is visualized. Upon withdrawal into the common carotid artery, there is occlusion of the proximal cervical ICA secondary to acute dissection/thrombus. External carotid artery remains patent, with early perfusion changes of external to internal collateralization, as there is filling of the carotid siphon via ascending cervical. Right common carotid artery:  Normal course caliber and contour. Right external carotid artery: Patent with antegrade flow. Right internal carotid artery: Normal course caliber and contour of the cervical portion. Vertical and petrous segment patent with normal course caliber contour. Lacerum segment patent. cavernous segment patent. Clinoid segment patent. Antegrade flow of the ophthalmic artery. Ophthalmic segment patent. Terminus patent. Crossover flow into the left hemisphere, with perfusion of both the ACA territory and the MCA territory at the conclusion. Right MCA: M1 segment patent. Insular and opercular segments patent. Unremarkable caliber and course of the cortical segments. Typical arterial, capillary/ parenchymal, and venous phase. Right ACA: A 1 segment patent. A 2 segment perfuses the right territory. Right vertebral: Cervical vertebral artery have normal course caliber and contour with expected filling of the C3, C2, C1 branches. The dominant runoff of the right vertebral artery is into a right posterior inferior cerebellar artery, with a small right vertebral artery to basilar contribution. The majority of flow into the basilar from the right is washed out from a left vertebral artery inflow. Left vertebral: Normal course caliber and contour of left vertebral artery. Dominant left vertebral artery. Expected filling of the C3, C2, C1 branches. Dominant vertebral artery contributes to basilar artery, with patent left posterior inferior cerebellar artery, patent bilateral superior cerebellar artery, and patent bilateral posterior cerebral artery, with  symmetric opacification from the left vertebral injection. The left posterior cerebral artery contributes to perfusion of the left temporal lobe and occipital lobe. Flat panel CT: CT demonstrates no significant mass effect, however, at the conclusion there is evidence of contrast staining of the left MCA territory gyri as well as a combination of ICH/SAH at the left basal ganglia and sylvian fissure. Evidence of involving left MCA infarction. PROCEDURE: Xavier Villegas is brought emergently to the neuro angiography suite, with the Xavier Villegas identified appropriately and placed supine position on the table. Left radial arterial line was placed by the anesthesia team. The Xavier Villegas is then prepped and draped in the usual sterile fashion. Ultrasound survey of the right inguinal region was performed with images stored and sent to PACs, confirming patency of the common femoral artery. 11 blade scalpel was used to make a small incision. Blunt dissection was performed. A micropuncture needle was used access the right common femoral artery under ultrasound. With excellent arterial blood flow returned, an .018 micro wire was passed through the needle, observed to enter the abdominal aorta under fluoroscopy. The needle was removed, and a micropuncture sheath was placed over the wire. The inner dilator and wire were removed, and an 035 Bentson wire was advanced under fluoroscopy into the abdominal aorta. The sheath was removed and a standard 5  [place] vascular sheath was placed. The dilator was removed and the sheath was flushed. A 93F JB-1 diagnostic catheter was advanced over the wire to the proximal descending thoracic aorta. Wire was then removed. Double flush of the catheter was performed. Catheter was then used to select the left common carotid artery. Formal angiogram was performed of the cervical and cerebral vessels. Glidewire was then used to navigate across the dissected proximal cervical ICA. Diagnostic catheter was then advanced  into the distal cervical ICA. Exchange length Rosen wire was then passed through the diagnostic catheter to the distal cervical ICA and the diagnostic catheter was removed. The 5 French sheath was removed and exchanged for 8 French 55 centimeter BrightTip sheath. Sheath was flushed and attached to pressurized and heparinized saline bag for constant forward flow. Then a neuron Max 80cm catheter was prepared on the back table. Ace 68 intermediate catheter was then loaded though the Neuron Max catheter with coaxial microcatheter, and advanced through the guide catheter. Under roadmap angiogram, Microcatheter system was then introduced through the Ace catheter using a synchro soft 014 wire and a Trevo Provue18 catheter. Microcatheter system was advanced into the internal carotid artery, to the level of the occlusion. The micro wire and the microcatheter were carefully placed at the proximal occlusion, and the aspiration catheter was advanced to the occlusion. Microwire in the microcatheter were withdrawn, an aspiration was performed. Aspiration was initiated, confirming return of blood flow. The catheter was then advanced into the occlusion with stasis of blood flow returned. With no blood flow returned after 30 seconds-60 seconds of aspiration, the aspiration catheter was removed entirely from the neuron max. This catheter was then flushed, with clot retrieved. Repeat angiogram performed. Further targets with thrombus were identified within M2 branches. The aspiration catheter was then used as a intermediate catheter, with the microcatheter advanced with the synchro wire. Under road map angiogram, the microcatheter and microwire were advanced beyond the M2 thrombus. The wire was removed and gentle aspiration was performed confirming blood flow returned. Gentle injection was performed to prove intraluminal location. A rotating hemostatic valve was then attached to the back end of the microcatheter, and a pressurized and  heparinized saline bag was attached to the catheter. 4 x 40 solitaire device was then selected. Back flush was achieved at the rotating hemostatic valve, and then the device was gently advanced through the microcatheter to the distal end. The retriever was then unsheathed by withdrawing the microcatheter under fluoroscopy. Once the retriever was completely unsheathed, control angiogram was performed from the balloon catheter. Constant aspiration was then performed through the Ace catheter as the retriever was gently and slowly withdrawn with fluoroscopic observation. Once the retriever was entirely removed from the system, free aspiration was confirmed at the hub of the intermediate catheter, with free blood return confirmed. Control angiogram was then performed. Persistent occlusion at the proximal M2 branch was confirmed. The microcatheter system was then again advanced through the Ace catheter. Once the micro wire microcatheter were beyond the M2 occlusion, the micro wire was removed and solitaire 4 x 40 device was deployed. After the solitaire was deployed across the occlusion, the Ace catheter was advanced into the M1 segment at the site of the occlusion. Local aspiration was performed upon withdrawal of the solitaire device under fluoroscopic observation. Once the retrieve her was entirely removed from the system, free aspiration was confirmed at the hub of the intermediate catheter, with free blood return confirmed. Ace aspiration catheter was removed. Control  angiogram was again performed. 035 rose in wire was then advanced through the neuron max into the cervical segment. The neuron max was withdrawn proximal to the bifurcation with repeat angiogram performed. At this point, the results and evolution of the case as well as the Xavier Villegas management was thoroughly discussed with the neurology team. We elected at this time to perform flat panel CT. After the results demonstrated presence of subarachnoid/ICH, we  elected not to place a carotid stent. The neuro on max was removed. Completion cerebral angiogram was then performed of the right carotid system and the bilateral vertebral arteries. Catheter was removed, and the bright tip sheath was exchanged for a short 8 French sheath at the right common femoral artery. Eight [place] Angio-Seal was deployed for hemostasis. Xavier Villegas tolerated the procedure well and remained hemodynamically stable throughout. No complications were encountered. IMPRESSION: Status post ultrasound guided access right common femoral artery for cerebral angiogram and mechanical thrombectomy of left MCA ELVO, achieving TICI 2b reperfusion after 3 passes with a combination of ADAPT technique and SOLUMBRA technique using a 4x40 solitaire. Status post deployment of Angio-Seal for hemostasis at the access site. Cerebral angiogram confirms acute left ICA dissection as the source of the intracranial emboli, with cervical ICA occluded at the case conclusion. Signed, Dulcy Fanny. Dellia Nims, RPVI [facility] Interventional Radiology Specialists [facility] Radiology PLAN: Given the presence of hemorrhage, the Xavier Villegas will stay intubated. Straight to CT for baseline CT scan. Right common femoral artery has been closed with Angio-Seal. Local wound care and right hip straight for 4 hours. The Xavier Villegas will be admitted to the neuro ICU. Target blood pressure goal of less than 818 systolic Electronically Signed   By: Corrie Mckusick D.O.   On: 02/20/2018 14:20   Ct Head Code Stroke Wo Contrast  Result Date: 02/19/2018 CLINICAL DATA:  Code stroke.  Right-sided weakness, aphasia EXAM: CT HEAD WITHOUT CONTRAST TECHNIQUE: Contiguous axial images were obtained from the base of the skull through the vertex without intravenous contrast. COMPARISON:  None. FINDINGS: Brain: Hypodensity left occipital parietal cortex and white matter compatible with acute infarct. Patchy hypodensity in the left frontal lobe also consistent with  infarct which is probably acute. Negative for hemorrhage.  Ventricle size normal.  No mass lesion. Vascular: Negative for hyperdense vessel Skull: Negative Sinuses/Orbits: Mild mucosal edema paranasal sinuses.  Normal orbit Other: None ASPECTS ([place] Stroke Program Early CT Score) - Ganglionic level infarction (caudate, lentiform nuclei, internal capsule, insula, M1-M3 cortex): 7 - Supraganglionic infarction (M4-M6 cortex): 2 Total score (0-10 with 10 being normal): 8 IMPRESSION: 1. Hypodensities left frontal lobe and left occipital parietal lobe most compatible with acute infarct. Possible emboli or watershed infarct. Negative for hemorrhage or mass-effect 2. ASPECTS is 8 3. These results were called by telephone at the time of interpretation on 02/19/2018 at 12:29 pm to Dr. Leonel Ramsay , who verbally acknowledged these results. Electronically Signed   By: Franchot Gallo M.D.   On: 02/19/2018 12:31   Ir Angio Intra Extracran Sel Com Carotid Innominate Uni R Mod Sed  Result Date: 02/20/2018 INDICATION: 37 year old male with a history of acute left MCA stroke Given the Xavier Villegas's age and baseline function, thrombectomy was offered to the Xavier Villegas's family as a possibility of improving his overall function and giving him the best chance of recovery, accepting a high risk of intracranial hemorrhage/malignant edema given the size of the infarction. EXAM: ULTRASOUND GUIDED ACCESS RIGHT COMMON FEMORAL ARTERY CEREBRAL ANGIOGRAM MECHANICAL THROMBECTOMY LEFT MCA CLOSURE OF  ACCESS SITE WITH ANGIO-SEAL DEVICE COMPARISON:  CT 02/19/2018, CT ANGIOGRAM 02/19/2018 MEDICATIONS: 2 g Ancef. The antibiotic was administered within 1 hour of the procedure ANESTHESIA/SEDATION: GENERAL ENDOTRACHEAL TUBE ANESTHESIA WITH THE ANESTHESIA TEAM CONTRAST:  140 cc Isovue FLUOROSCOPY TIME:  Fluoroscopy Time: 35 minutes 6 seconds (2373 mGy). COMPLICATIONS: None TECHNIQUE: Informed written consent was obtained from the Xavier Villegas's family after a  thorough discussion of the procedural risks, benefits and alternatives. Specific risks discussed include: Bleeding, infection, contrast reaction, kidney injury/failure, need for further procedure/surgery, arterial injury or dissection, embolization to new territory, intracranial hemorrhage (10-15% risk), neurologic deterioration, cardiopulmonary collapse, death. All questions were addressed. Maximal Sterile Barrier Technique was utilized including during the procedure including caps, mask, sterile gowns, sterile gloves, sterile drape, hand hygiene and skin antiseptic. A timeout was performed prior to the initiation of the procedure. The anesthesia team was present to provide general endotracheal tube anesthesia and for Xavier Villegas monitoring during the procedure. Interventional neuro radiology nursing staff was also present. FINDINGS: Ultrasound images were performed of the right inguinal region, confirming patency of the right common femoral artery. Images were stored and sent to PACs. Initial Findings: Left common carotid artery:  Normal course caliber and contour. Left external carotid artery: Patent with antegrade flow. Left internal carotid artery: There is irregular appearance of the posterior proximal internal carotid artery extending from the carotid bulb along the proximal 3rd of the right internal carotid artery. This results in at least 50 percent narrowing, with irregular blunted appearance on the distal filling defect, compatible with thrombus/dissection. The mid and distal cervical segment of the ICA is patent. Unremarkable petrous segment, lacerum segment, cavernous segment, clinoid segment/ophthalmic segment and terminal segment. Left MCA: MCA occluded at the origin with a reversed meniscus suggesting acute thrombus. Patent anterior choroidal artery and a small posterior communicating artery, which washes out from collateral flow from the posterior. The initial injection demonstrates patent  meningo-hypophyseal branch, with sellar blush. Left ACA: A 1 segment patent. A 2 segment perfuses the left territory. Patent anterior communicating artery with transient perfusion of the right ACA territory. Completion Findings: Left MCA: After mechanical thrombectomy, there is reperfusion of the middle cerebral artery, with slow flow through the insular branches, TICI 2b. Early venous drainage is visualized. Upon withdrawal into the common carotid artery, there is occlusion of the proximal cervical ICA secondary to acute dissection/thrombus. External carotid artery remains patent, with early perfusion changes of external to internal collateralization, as there is filling of the carotid siphon via ascending cervical. Right common carotid artery:  Normal course caliber and contour. Right external carotid artery: Patent with antegrade flow. Right internal carotid artery: Normal course caliber and contour of the cervical portion. Vertical and petrous segment patent with normal course caliber contour. Lacerum segment patent. cavernous segment patent. Clinoid segment patent. Antegrade flow of the ophthalmic artery. Ophthalmic segment patent. Terminus patent. Crossover flow into the left hemisphere, with perfusion of both the ACA territory and the MCA territory at the conclusion. Right MCA: M1 segment patent. Insular and opercular segments patent. Unremarkable caliber and course of the cortical segments. Typical arterial, capillary/ parenchymal, and venous phase. Right ACA: A 1 segment patent. A 2 segment perfuses the right territory. Right vertebral: Cervical vertebral artery have normal course caliber and contour with expected filling of the C3, C2, C1 branches. The dominant runoff of the right vertebral artery is into a right posterior inferior cerebellar artery, with a small right vertebral artery to basilar contribution. The majority of flow  into the basilar from the right is washed out from a left vertebral artery  inflow. Left vertebral: Normal course caliber and contour of left vertebral artery. Dominant left vertebral artery. Expected filling of the C3, C2, C1 branches. Dominant vertebral artery contributes to basilar artery, with patent left posterior inferior cerebellar artery, patent bilateral superior cerebellar artery, and patent bilateral posterior cerebral artery, with symmetric opacification from the left vertebral injection. The left posterior cerebral artery contributes to perfusion of the left temporal lobe and occipital lobe. Flat panel CT: CT demonstrates no significant mass effect, however, at the conclusion there is evidence of contrast staining of the left MCA territory gyri as well as a combination of ICH/SAH at the left basal ganglia and sylvian fissure. Evidence of involving left MCA infarction. PROCEDURE: Xavier Villegas is brought emergently to the neuro angiography suite, with the Xavier Villegas identified appropriately and placed supine position on the table. Left radial arterial line was placed by the anesthesia team. The Xavier Villegas is then prepped and draped in the usual sterile fashion. Ultrasound survey of the right inguinal region was performed with images stored and sent to PACs, confirming patency of the common femoral artery. 11 blade scalpel was used to make a small incision. Blunt dissection was performed. A micropuncture needle was used access the right common femoral artery under ultrasound. With excellent arterial blood flow returned, an .018 micro wire was passed through the needle, observed to enter the abdominal aorta under fluoroscopy. The needle was removed, and a micropuncture sheath was placed over the wire. The inner dilator and wire were removed, and an 035 Bentson wire was advanced under fluoroscopy into the abdominal aorta. The sheath was removed and a standard 5 [place] vascular sheath was placed. The dilator was removed and the sheath was flushed. A 17F JB-1 diagnostic catheter was advanced over  the wire to the proximal descending thoracic aorta. Wire was then removed. Double flush of the catheter was performed. Catheter was then used to select the left common carotid artery. Formal angiogram was performed of the cervical and cerebral vessels. Glidewire was then used to navigate across the dissected proximal cervical ICA. Diagnostic catheter was then advanced into the distal cervical ICA. Exchange length Rosen wire was then passed through the diagnostic catheter to the distal cervical ICA and the diagnostic catheter was removed. The 5 French sheath was removed and exchanged for 8 French 55 centimeter BrightTip sheath. Sheath was flushed and attached to pressurized and heparinized saline bag for constant forward flow. Then a neuron Max 80cm catheter was prepared on the back table. Ace 68 intermediate catheter was then loaded though the Neuron Max catheter with coaxial microcatheter, and advanced through the guide catheter. Under roadmap angiogram, Microcatheter system was then introduced through the Ace catheter using a synchro soft 014 wire and a Trevo Provue18 catheter. Microcatheter system was advanced into the internal carotid artery, to the level of the occlusion. The micro wire and the microcatheter were carefully placed at the proximal occlusion, and the aspiration catheter was advanced to the occlusion. Microwire in the microcatheter were withdrawn, an aspiration was performed. Aspiration was initiated, confirming return of blood flow. The catheter was then advanced into the occlusion with stasis of blood flow returned. With no blood flow returned after 30 seconds-60 seconds of aspiration, the aspiration catheter was removed entirely from the neuron max. This catheter was then flushed, with clot retrieved. Repeat angiogram performed. Further targets with thrombus were identified within M2 branches. The aspiration catheter was then  used as a intermediate catheter, with the microcatheter advanced with  the synchro wire. Under road map angiogram, the microcatheter and microwire were advanced beyond the M2 thrombus. The wire was removed and gentle aspiration was performed confirming blood flow returned. Gentle injection was performed to prove intraluminal location. A rotating hemostatic valve was then attached to the back end of the microcatheter, and a pressurized and heparinized saline bag was attached to the catheter. 4 x 40 solitaire device was then selected. Back flush was achieved at the rotating hemostatic valve, and then the device was gently advanced through the microcatheter to the distal end. The retriever was then unsheathed by withdrawing the microcatheter under fluoroscopy. Once the retriever was completely unsheathed, control angiogram was performed from the balloon catheter. Constant aspiration was then performed through the Ace catheter as the retriever was gently and slowly withdrawn with fluoroscopic observation. Once the retriever was entirely removed from the system, free aspiration was confirmed at the hub of the intermediate catheter, with free blood return confirmed. Control angiogram was then performed. Persistent occlusion at the proximal M2 branch was confirmed. The microcatheter system was then again advanced through the Ace catheter. Once the micro wire microcatheter were beyond the M2 occlusion, the micro wire was removed and solitaire 4 x 40 device was deployed. After the solitaire was deployed across the occlusion, the Ace catheter was advanced into the M1 segment at the site of the occlusion. Local aspiration was performed upon withdrawal of the solitaire device under fluoroscopic observation. Once the retrieve her was entirely removed from the system, free aspiration was confirmed at the hub of the intermediate catheter, with free blood return confirmed. Ace aspiration catheter was removed. Control angiogram was again performed. 035 rose in wire was then advanced through the neuron  max into the cervical segment. The neuron max was withdrawn proximal to the bifurcation with repeat angiogram performed. At this point, the results and evolution of the case as well as the Xavier Villegas management was thoroughly discussed with the neurology team. We elected at this time to perform flat panel CT. After the results demonstrated presence of subarachnoid/ICH, we elected not to place a carotid stent. The neuro on max was removed. Completion cerebral angiogram was then performed of the right carotid system and the bilateral vertebral arteries. Catheter was removed, and the bright tip sheath was exchanged for a short 8 French sheath at the right common femoral artery. Eight [place] Angio-Seal was deployed for hemostasis. Xavier Villegas tolerated the procedure well and remained hemodynamically stable throughout. No complications were encountered. IMPRESSION: Status post ultrasound guided access right common femoral artery for cerebral angiogram and mechanical thrombectomy of left MCA ELVO, achieving TICI 2b reperfusion after 3 passes with a combination of ADAPT technique and SOLUMBRA technique using a 4x40 solitaire. Status post deployment of Angio-Seal for hemostasis at the access site. Cerebral angiogram confirms acute left ICA dissection as the source of the intracranial emboli, with cervical ICA occluded at the case conclusion. Signed, Dulcy Fanny. Dellia Nims, RPVI Vascular and Interventional Radiology Specialists [facility] Radiology PLAN: Given the presence of hemorrhage, the Xavier Villegas will stay intubated. Straight to CT for baseline CT scan. Right common femoral artery has been closed with Angio-Seal. Local wound care and right hip straight for 4 hours. The Xavier Villegas will be admitted to the neuro ICU. Target blood pressure goal of less than 449 systolic Electronically Signed   By: Corrie Mckusick D.O.   On: 02/20/2018 14:20   Ir Angio Vertebral Sel Vertebral  Bilat Mod Sed  Result Date: 02/20/2018 INDICATION: 37 year old  male with a history of acute left MCA stroke Given the Xavier Villegas's age and baseline function, thrombectomy was offered to the Xavier Villegas's family as a possibility of improving his overall function and giving him the best chance of recovery, accepting a high risk of intracranial hemorrhage/malignant edema given the size of the infarction. EXAM: ULTRASOUND GUIDED ACCESS RIGHT COMMON FEMORAL ARTERY CEREBRAL ANGIOGRAM MECHANICAL THROMBECTOMY LEFT MCA CLOSURE OF ACCESS SITE WITH ANGIO-SEAL DEVICE COMPARISON:  CT 02/19/2018, CT ANGIOGRAM 02/19/2018 MEDICATIONS: 2 g Ancef. The antibiotic was administered within 1 hour of the procedure ANESTHESIA/SEDATION: GENERAL ENDOTRACHEAL TUBE ANESTHESIA WITH THE ANESTHESIA TEAM CONTRAST:  140 cc Isovue FLUOROSCOPY TIME:  Fluoroscopy Time: 35 minutes 6 seconds (2373 mGy). COMPLICATIONS: None TECHNIQUE: Informed written consent was obtained from the Xavier Villegas's family after a thorough discussion of the procedural risks, benefits and alternatives. Specific risks discussed include: Bleeding, infection, contrast reaction, kidney injury/failure, need for further procedure/surgery, arterial injury or dissection, embolization to new territory, intracranial hemorrhage (10-15% risk), neurologic deterioration, cardiopulmonary collapse, death. All questions were addressed. Maximal Sterile Barrier Technique was utilized including during the procedure including caps, mask, sterile gowns, sterile gloves, sterile drape, hand hygiene and skin antiseptic. A timeout was performed prior to the initiation of the procedure. The anesthesia team was present to provide general endotracheal tube anesthesia and for Xavier Villegas monitoring during the procedure. Interventional neuro radiology nursing staff was also present. FINDINGS: Ultrasound images were performed of the right inguinal region, confirming patency of the right common femoral artery. Images were stored and sent to PACs. Initial Findings: Left common carotid  artery:  Normal course caliber and contour. Left external carotid artery: Patent with antegrade flow. Left internal carotid artery: There is irregular appearance of the posterior proximal internal carotid artery extending from the carotid bulb along the proximal 3rd of the right internal carotid artery. This results in at least 50 percent narrowing, with irregular blunted appearance on the distal filling defect, compatible with thrombus/dissection. The mid and distal cervical segment of the ICA is patent. Unremarkable petrous segment, lacerum segment, cavernous segment, clinoid segment/ophthalmic segment and terminal segment. Left MCA: MCA occluded at the origin with a reversed meniscus suggesting acute thrombus. Patent anterior choroidal artery and a small posterior communicating artery, which washes out from collateral flow from the posterior. The initial injection demonstrates patent meningo-hypophyseal branch, with sellar blush. Left ACA: A 1 segment patent. A 2 segment perfuses the left territory. Patent anterior communicating artery with transient perfusion of the right ACA territory. Completion Findings: Left MCA: After mechanical thrombectomy, there is reperfusion of the middle cerebral artery, with slow flow through the insular branches, TICI 2b. Early venous drainage is visualized. Upon withdrawal into the common carotid artery, there is occlusion of the proximal cervical ICA secondary to acute dissection/thrombus. External carotid artery remains patent, with early perfusion changes of external to internal collateralization, as there is filling of the carotid siphon via ascending cervical. Right common carotid artery:  Normal course caliber and contour. Right external carotid artery: Patent with antegrade flow. Right internal carotid artery: Normal course caliber and contour of the cervical portion. Vertical and petrous segment patent with normal course caliber contour. Lacerum segment patent. cavernous  segment patent. Clinoid segment patent. Antegrade flow of the ophthalmic artery. Ophthalmic segment patent. Terminus patent. Crossover flow into the left hemisphere, with perfusion of both the ACA territory and the MCA territory at the conclusion. Right MCA: M1 segment patent. Insular and opercular  segments patent. Unremarkable caliber and course of the cortical segments. Typical arterial, capillary/ parenchymal, and venous phase. Right ACA: A 1 segment patent. A 2 segment perfuses the right territory. Right vertebral: Cervical vertebral artery have normal course caliber and contour with expected filling of the C3, C2, C1 branches. The dominant runoff of the right vertebral artery is into a right posterior inferior cerebellar artery, with a small right vertebral artery to basilar contribution. The majority of flow into the basilar from the right is washed out from a left vertebral artery inflow. Left vertebral: Normal course caliber and contour of left vertebral artery. Dominant left vertebral artery. Expected filling of the C3, C2, C1 branches. Dominant vertebral artery contributes to basilar artery, with patent left posterior inferior cerebellar artery, patent bilateral superior cerebellar artery, and patent bilateral posterior cerebral artery, with symmetric opacification from the left vertebral injection. The left posterior cerebral artery contributes to perfusion of the left temporal lobe and occipital lobe. Flat panel CT: CT demonstrates no significant mass effect, however, at the conclusion there is evidence of contrast staining of the left MCA territory gyri as well as a combination of ICH/SAH at the left basal ganglia and sylvian fissure. Evidence of involving left MCA infarction. PROCEDURE: Xavier Villegas is brought emergently to the neuro angiography suite, with the Xavier Villegas identified appropriately and placed supine position on the table. Left radial arterial line was placed by the anesthesia team. The Xavier Villegas  is then prepped and draped in the usual sterile fashion. Ultrasound survey of the right inguinal region was performed with images stored and sent to PACs, confirming patency of the common femoral artery. 11 blade scalpel was used to make a small incision. Blunt dissection was performed. A micropuncture needle was used access the right common femoral artery under ultrasound. With excellent arterial blood flow returned, an .018 micro wire was passed through the needle, observed to enter the abdominal aorta under fluoroscopy. The needle was removed, and a micropuncture sheath was placed over the wire. The inner dilator and wire were removed, and an 035 Bentson wire was advanced under fluoroscopy into the abdominal aorta. The sheath was removed and a standard 5 [place] vascular sheath was placed. The dilator was removed and the sheath was flushed. A 54F JB-1 diagnostic catheter was advanced over the wire to the proximal descending thoracic aorta. Wire was then removed. Double flush of the catheter was performed. Catheter was then used to select the left common carotid artery. Formal angiogram was performed of the cervical and cerebral vessels. Glidewire was then used to navigate across the dissected proximal cervical ICA. Diagnostic catheter was then advanced into the distal cervical ICA. Exchange length Rosen wire was then passed through the diagnostic catheter to the distal cervical ICA and the diagnostic catheter was removed. The 5 French sheath was removed and exchanged for 8 French 55 centimeter BrightTip sheath. Sheath was flushed and attached to pressurized and heparinized saline bag for constant forward flow. Then a neuron Max 80cm catheter was prepared on the back table. Ace 68 intermediate catheter was then loaded though the Neuron Max catheter with coaxial microcatheter, and advanced through the guide catheter. Under roadmap angiogram, Microcatheter system was then introduced through the Ace catheter using a  synchro soft 014 wire and a Trevo Provue18 catheter. Microcatheter system was advanced into the internal carotid artery, to the level of the occlusion. The micro wire and the microcatheter were carefully placed at the proximal occlusion, and the aspiration catheter was advanced to the  occlusion. Microwire in the microcatheter were withdrawn, an aspiration was performed. Aspiration was initiated, confirming return of blood flow. The catheter was then advanced into the occlusion with stasis of blood flow returned. With no blood flow returned after 30 seconds-60 seconds of aspiration, the aspiration catheter was removed entirely from the neuron max. This catheter was then flushed, with clot retrieved. Repeat angiogram performed. Further targets with thrombus were identified within M2 branches. The aspiration catheter was then used as a intermediate catheter, with the microcatheter advanced with the synchro wire. Under road map angiogram, the microcatheter and microwire were advanced beyond the M2 thrombus. The wire was removed and gentle aspiration was performed confirming blood flow returned. Gentle injection was performed to prove intraluminal location. A rotating hemostatic valve was then attached to the back end of the microcatheter, and a pressurized and heparinized saline bag was attached to the catheter. 4 x 40 solitaire device was then selected. Back flush was achieved at the rotating hemostatic valve, and then the device was gently advanced through the microcatheter to the distal end. The retriever was then unsheathed by withdrawing the microcatheter under fluoroscopy. Once the retriever was completely unsheathed, control angiogram was performed from the balloon catheter. Constant aspiration was then performed through the Ace catheter as the retriever was gently and slowly withdrawn with fluoroscopic observation. Once the retriever was entirely removed from the system, free aspiration was confirmed at the hub  of the intermediate catheter, with free blood return confirmed. Control angiogram was then performed. Persistent occlusion at the proximal M2 branch was confirmed. The microcatheter system was then again advanced through the Ace catheter. Once the micro wire microcatheter were beyond the M2 occlusion, the micro wire was removed and solitaire 4 x 40 device was deployed. After the solitaire was deployed across the occlusion, the Ace catheter was advanced into the M1 segment at the site of the occlusion. Local aspiration was performed upon withdrawal of the solitaire device under fluoroscopic observation. Once the retrieve her was entirely removed from the system, free aspiration was confirmed at the hub of the intermediate catheter, with free blood return confirmed. Ace aspiration catheter was removed. Control angiogram was again performed. 035 rose in wire was then advanced through the neuron max into the cervical segment. The neuron max was withdrawn proximal to the bifurcation with repeat angiogram performed. At this point, the results and evolution of the case as well as the Xavier Villegas management was thoroughly discussed with the neurology team. We elected at this time to perform flat panel CT. After the results demonstrated presence of subarachnoid/ICH, we elected not to place a carotid stent. The neuro on max was removed. Completion cerebral angiogram was then performed of the right carotid system and the bilateral vertebral arteries. Catheter was removed, and the bright tip sheath was exchanged for a short 8 French sheath at the right common femoral artery. Eight [place] Angio-Seal was deployed for hemostasis. Xavier Villegas tolerated the procedure well and remained hemodynamically stable throughout. No complications were encountered. IMPRESSION: Status post ultrasound guided access right common femoral artery for cerebral angiogram and mechanical thrombectomy of left MCA ELVO, achieving TICI 2b reperfusion after 3 passes  with a combination of ADAPT technique and SOLUMBRA technique using a 4x40 solitaire. Status post deployment of Angio-Seal for hemostasis at the access site. Cerebral angiogram confirms acute left ICA dissection as the source of the intracranial emboli, with cervical ICA occluded at the case conclusion. Signed, Dulcy Fanny. Dellia Nims, [facility] Radiology PLAN: Given the presence of hemorrhage, the Xavier Villegas will stay intubated. Straight to CT for baseline CT scan. Right common femoral artery has been closed with Angio-Seal. Local wound care and right hip straight for 4 hours. The Xavier Villegas will be admitted to the neuro ICU. Target blood pressure goal of less than 702 systolic Electronically Signed   By: Corrie Mckusick D.O.   On: 02/20/2018 14:20    Review of Systems  Unable to perform ROS: Critical illness  Gastrointestinal: Positive for heartburn.   Blood pressure 129/76, pulse (!) 55, temperature (!) 97.2 F (36.2 C), temperature source Axillary, resp. rate 16, height 6' (1.829 m), weight 86 kg (189 lb 9.5 oz), SpO2 100 %. Physical Exam  Constitutional: He appears lethargic.  Neurological: He appears lethargic.  Xavier Villegas does have some spontaneous movement does move to voice and occasionally open his eyes does seem to reliably follow commands on the left side has a right baseline plegia. Will not hold up, 2 fingers so there is some degree of dysphasia. Xavier Villegas is intubated and recently has had the propofol and sedation turned off.    Assessment/Plan: 37 year old with large dominant hemisphere hemorrhagic stroke with increased edema and midline shift. Xavier Villegas currently is awake and follows some occasional simple commands with a right hemiplegia. I've had extensive conversations with the Xavier Villegas's family as well as neurology and we feel the Xavier Villegas's best possible chance of survival and functional improvement is a decompressive hemicraniectomy and  duraplasty. I've extensively gone over the risks and benefits of that with the Xavier Villegas and familyas well as perioperative course expectations of outcome and alternatives to surgery and they understand and agree to proceed forward. Xavier Villegas did receive subcutaneous  heparin earlier this morning as well as has been getting tube feeds. We have stopped heparin and the  tube feeds we will give that a few hours to get out of his system and then proceed forward.  Emanii Bugbee P 02/21/2018, 10:30 AM

## 2018-02-21 NOTE — Progress Notes (Signed)
Initial Nutrition Assessment  DOCUMENTATION CODES:   Not applicable  INTERVENTION:   Continue Vital High Protein @ 40 ml/hr with 30 ml Prostat BID post-op RD will follow up to determine if on propofol and adjust TF as appropriate.    NUTRITION DIAGNOSIS:   Inadequate oral intake related to inability to eat as evidenced by NPO status.  GOAL:   Patient will meet greater than or equal to 90% of their needs  MONITOR:   I & O's, TF tolerance  REASON FOR ASSESSMENT:   Consult Enteral/tube feeding initiation and management  ASSESSMENT:   Pt admitted with left MCA stroke with Left ICA dissection with proximal left M1/MCA occlusion where he was taken for thrombectomy and post procedure CT showed SAH and hemorrhagic transformation.  Pt discussed during ICU rounds and with RN.  Spoke with RN at bedside, TF held for craniectomy today  Patient is currently intubated on ventilator support MV: 9 L/min Temp (24hrs), Avg:98 F (36.7 C), Min:97.2 F (36.2 C), Max:98.9 F (37.2 C)  Propofol: off for wake up assessment Cleviprex: off Medications reviewed and include: folic acid, vitamin B 12  Hypertonic saline @ 75 ml/hr Labs reviewed: Na 150 (H), K+ 3.4 (L), TG: 103   TF: Vital High Protein @ 40 ml/hr and 30 ml Prostat BID Provides: 1160 kcal, 114 grams protein, and 802 ml free water.   NUTRITION - FOCUSED PHYSICAL EXAM:    Most Recent Value  Orbital Region  No depletion  Upper Arm Region  No depletion  Thoracic and Lumbar Region  No depletion  Buccal Region  No depletion  Temple Region  No depletion  Clavicle Bone Region  No depletion  Clavicle and Acromion Bone Region  No depletion  Scapular Bone Region  Unable to assess  Dorsal Hand  No depletion  Patellar Region  No depletion  Anterior Thigh Region  No depletion  Posterior Calf Region  No depletion  Edema (RD Assessment)  None  Hair  Reviewed  Eyes  Unable to assess  Mouth  Unable to assess  Skin  Reviewed   Nails  Reviewed       Diet Order:   Diet Order           Diet NPO time specified  Diet effective now          EDUCATION NEEDS:   No education needs have been identified at this time  Skin:  Skin Assessment: Reviewed RN Assessment  Last BM:  unknown  Height:   Ht Readings from Last 1 Encounters:  02/21/18 6' (1.829 m)    Weight:   Wt Readings from Last 1 Encounters:  02/21/18 189 lb 9.5 oz (86 kg)    Ideal Body Weight:  80.9 kg  BMI:  Body mass index is 25.71 kg/m.  Estimated Nutritional Needs:   Kcal:  2191  Protein:  105-130 grams  Fluid:  > 2 L/day  Maylon Peppers RD, LDN, CNSC [phone] Pager [phone] After Hours Pager

## 2018-02-21 NOTE — H&P (View-Only) (Signed)
Cardiology Consultation:   Patient ID: Xavier Villegas; [id number]; 1981-02-10   Admit date: 02/19/2018 Date of Consult: 02/21/2018  Primary Care Provider: Patient, No Pcp Per Primary Cardiologist: Karma Greaser   Patient Profile:   Xavier Villegas is a 37 y.o. male with history of tobacco use who is being seen today for the evaluation of aortic valve mass consistent with aortic valve vegetation at the request of Dr. Erlinda Hong.   History of Present Illness:   Xavier Villegas is a 37 year old male who presented to [facility] with right-sided weakness and aphasia on 02/19/2018. Unfortunately, due to his current status, history difficult to obtain and was taken from chart and family at bedside. Patient's family states that there was some question of hand symptoms (trouble writing and numbness) for the past week. On day of admission, he was found on the floor by his girlfriend with reports he had been last seen normal at bedtime. Given his significant others findings, EMS was called for transport to [facility] for further evaluation.    Upon transport, EMS activated code stroke. CT of his head was suggestive of a large area of infarct, an apparent embolic left MCA stroke secondary to presumably spontaneous left ICA dissection. He was then taken to MRI for consideration of IR if his infarct burden appeared to be less than estimated by CTP.  Interventional radiology was consulted for possible IR intervention.  Per IR note, there was concern for reperfusion malignant edema/hemorrhage. The decision was then made to proceed with mechanical thrombectomy performed on 02/19/2018, though this was complicated by intracranial hemorrhage. Patient is now intubated and sedated on propofol.  Per family, he had complaints of a migraine headache approximately 3 weeks ago as well as complaints of mild lower extremity swelling. He has a hx of tobacco and mild alcohol use and no history of illicit drug use. UDS negative. He has a  strong family history of CVA in both paternal and maternal grandparents.  Family at bedside report no recent fevers, chills, cough, chest pain, palpitations or syncope.  His father does report that he was recently treated at an urgent care for a lower lip cyst which was drained and he was treated with antibiotics.  An echocardiogram was performed 02/20/2018 which revealed a small, mobile vegetation on the left ventricular aspect (small-medium sized mobile mass) consistent with vegetation, aortic valve endocarditis.  Cardiology has been consulted given the above echocardiogram findings.  Past Medical History:  Diagnosis Date  . Tobacco abuse     The histories are not reviewed yet. Please review them in the "History" navigator section and refresh this [place].   Prior to Admission medications   Not on File    Inpatient Medications: Scheduled Meds: .  stroke: mapping our early stages of recovery book   Does not apply Once  . aspirin  325 mg Oral Daily   Or  . aspirin  300 mg Rectal Daily  . chlorhexidine gluconate ([name] KIT)  15 mL Mouth Rinse BID  . Chlorhexidine Gluconate Cloth  6 each Topical Q0600  . feeding supplement (PRO-STAT SUGAR FREE 64)  30 mL Per Tube BID  . feeding supplement (VITAL HIGH PROTEIN)  1,000 mL Per Tube Q24H  . folic acid  1 mg Oral Daily  . mouth rinse  15 mL Mouth Rinse 10 times per day  . mupirocin ointment  1 application Nasal BID  . vitamin B-12  1,000 mcg Oral Daily   Continuous Infusions: . clevidipine Stopped (  02/19/18 2042)  . famotidine (PEPCID) IV    . propofol (DIPRIVAN) infusion 50 mcg/kg/min (02/21/18 0600)  . sodium chloride (hypertonic) 75 mL/hr (02/21/18 0859)   PRN Meds: acetaminophen **OR** acetaminophen (TYLENOL) oral liquid 160 mg/5 mL **OR** acetaminophen, docusate, fentaNYL (SUBLIMAZE) injection, iohexol  Allergies:   No Known Allergies  Social History:   Social History   Socioeconomic History  . Marital status: Single     Spouse name: Not on file  . Number of children: Not on file  . Years of education: Not on file  . Highest education level: Not on file  Occupational History  . Not on file  Social Needs  . Financial resource strain: Not on file  . Food insecurity:    Worry: Not on file    Inability: Not on file  . Transportation needs:    Medical: Not on file    Non-medical: Not on file  Tobacco Use  . Smoking status: Current Every Day Smoker    Packs/day: 0.50    Types: Cigarettes  . Smokeless tobacco: Never Used  Substance and Sexual Activity  . Alcohol use: Yes    Alcohol/week: 8.4 oz    Types: 14 Cans of beer per week  . Drug use: Never  . Sexual activity: Yes  Lifestyle  . Physical activity:    Days per week: Not on file    Minutes per session: Not on file  . Stress: Not on file  Relationships  . Social connections:    Talks on phone: Not on file    Gets together: Not on file    Attends religious service: Not on file    Active member of club or organization: Not on file    Attends meetings of clubs or organizations: Not on file    Relationship status: Not on file  . Intimate partner violence:    Fear of current or ex partner: Not on file    Emotionally abused: Not on file    Physically abused: Not on file    Forced sexual activity: Not on file  Other Topics Concern  . Not on file  Social History Narrative  . Not on file    Family History:   Family History  Problem Relation Age of Onset  . Stroke Maternal Grandmother   . Stroke Maternal Grandfather    Family Status:  Family Status  Relation Name Status  . MGM  (Not Specified)  . MGF  (Not Specified)    ROS:  Please see the history of present illness.  All other ROS reviewed and negative.     Physical Exam/Data:   Vitals:   02/21/18 0800 02/21/18 0815 02/21/18 1131 02/21/18 1200  BP:  129/76 (!) 143/87   Pulse:  (!) 55 69   Resp:  16 18   Temp: (!) 97.2 F (36.2 C)   99.7 F (37.6 C)  TempSrc: Axillary    Axillary  SpO2:  100% 97%   Weight:      Height:        Intake/Output Summary (Last 24 hours) at 02/21/2018 1417 Last data filed at 02/21/2018 0700 Gross per 24 hour  Intake 2782.79 ml  Output 765 ml  Net 2017.79 ml   Filed Weights   02/19/18 1600 02/21/18 0500  Weight: 182 lb 15.7 oz (83 kg) 189 lb 9.5 oz (86 kg)   Body mass index is 25.71 kg/m.   General: Intubated and sedated,  NAD Skin: Warm, dry, intact  Head:  Normocephalic, atraumatic, clear, moist mucus membranes. Neck: Negative for carotid bruits. No JVD Lungs:Clear to ausculation bilaterally. No wheezes, rales, or rhonchi. ETT for ventilation  Cardiovascular: RRR with S1 S2. No murmurs, rubs or gallops Abdomen: Soft, non-tender, non-distended with normoactive bowel sounds. No obvious abdominal masses. MSK: Strength and tone appear normal for age on left upper and lower extremity. Flaccid on upper and lower right  Extremities: No edema. No clubbing or cyanosis. DP/PT pulses 1+ bilaterally Neuro: Intubated and sedated. No facial asymmetry. Moves left upper and lower extremity  spontaneously. Psych: Intubated and sedated.   EKG:  The EKG was personally reviewed and demonstrates:  None on file  Telemetry:  Telemetry was personally reviewed and demonstrates: 02/21/18 NSR HR 58  Relevant CV Studies:  ECHO: 02/20/2018: Study Conclusions  - Left ventricle: The cavity size was normal. Wall thickness was   increased in a pattern of mild LVH. Systolic function was normal.   The estimated ejection fraction was in the range of 55% to 60%.   Wall motion was normal; there were no regional wall motion   abnormalities. Left ventricular diastolic function parameters   were normal. - Aortic valve: There was a small, mobile vegetation on the left   ventricular aspect.  Impressions:  - There is a small - medium sized mobile mass on the LV aspect of   the aortic valve.   The appearence is c/w vegetation.   Aortic valve  endocarditis  CATH: None  Laboratory Data:  Chemistry Recent Labs  Lab 02/19/18 1215 02/19/18 1219 02/20/18 0547  02/20/18 2302 02/21/18 0539 02/21/18 1157  NA 140 139 142   < > 145 150* 153*  K 3.8 3.5 4.0  --   --  3.4*  --   CL 105 101 110  --   --  118*  --   CO2 22  --  23  --   --  25  --   GLUCOSE 141* 137* 133*  --   --  120*  --   BUN <5* <3* 5*  --   --  12  --   CREATININE 0.73 0.80 0.71  --   --  0.70  --   CALCIUM 8.7*  --  7.9*  --   --  8.0*  --   GFRNONAA >60  --  >60  --   --  >60  --   GFRAA >60  --  >60  --   --  >60  --   ANIONGAP 13  --  9  --   --  7  --    < > = values in this interval not displayed.    Total Protein  Date Value Ref Range Status  02/19/2018 7.2 6.5 - 8.1 g/dL Final   Albumin  Date Value Ref Range Status  02/19/2018 3.4 (L) 3.5 - 5.0 g/dL Final   AST  Date Value Ref Range Status  02/19/2018 28 15 - 41 U/L Final   ALT  Date Value Ref Range Status  02/19/2018 33 0 - 44 U/L Final    Comment:    Please note change in reference range.   Alkaline Phosphatase  Date Value Ref Range Status  02/19/2018 96 38 - 126 U/L Final   Total Bilirubin  Date Value Ref Range Status  02/19/2018 1.1 0.3 - 1.2 mg/dL Final   Hematology Recent Labs  Lab 02/19/18 1215 02/19/18 1219 02/20/18 0547 02/21/18 0539  WBC 15.4*  --  19.5* 14.2*  RBC 4.61  --  3.94* 3.93*  HGB 18.0* 18.0* 15.2 15.1  HCT 48.7 53.0* 43.0 44.2  MCV 105.6*  --  109.1* 112.5*  MCH 39.0*  --  38.6* 38.4*  MCHC 37.0*  --  35.3 34.2  RDW 13.0  --  13.2 13.5  PLT 265  --  174 157   Cardiac EnzymesNo results for input(s): TROPONINI in the last 168 hours.  Recent Labs  Lab 02/19/18 1218  TROPIPOC 0.00    BNPNo results for input(s): BNP, PROBNP in the last 168 hours.  DDimer No results for input(s): DDIMER in the last 168 hours. TSH:  Lab Results  Component Value Date   TSH 1.000 02/20/2018   Lipids: Lab Results  Component Value Date   CHOL 101 02/20/2018     HDL 34 (L) 02/20/2018   LDLCALC 46 02/20/2018   TRIG 103 02/20/2018   CHOLHDL 3.0 02/20/2018   HgbA1c: Lab Results  Component Value Date   HGBA1C 4.9 02/20/2018    Radiology/Studies:  Ct Angio Head W Or Wo Contrast  Result Date: 02/19/2018 CLINICAL DATA:  Stroke.  Right-sided weakness aphasia EXAM: CT ANGIOGRAPHY HEAD AND NECK CT PERFUSION BRAIN TECHNIQUE: Multidetector CT imaging of the head and neck was performed using the standard protocol during bolus administration of intravenous contrast. Multiplanar CT image reconstructions and MIPs were obtained to evaluate the vascular anatomy. Carotid stenosis measurements (when applicable) are obtained utilizing NASCET criteria, using the distal internal carotid diameter as the denominator. Multiphase CT imaging of the brain was performed following IV bolus contrast injection. Subsequent parametric perfusion maps were calculated using RAPID software. CONTRAST:  68m ISOVUE-370 IOPAMIDOL (ISOVUE-370) INJECTION 76% COMPARISON:  CT head 02/19/2018 FINDINGS: CTA NECK FINDINGS Aortic arch: Normal aortic arch without atherosclerotic disease or aneurysm. Proximal great vessels normal Right carotid system: Normal right carotid. No evidence of atherosclerotic disease dissection or stenosis Left carotid system: Irregularity and stenosis of the proximal left internal carotid artery compatible with dissection. No significant calcification. Intraluminal filling defect compatible with thrombus. Left internal carotid artery small in caliber but patent to the cavernous segment. There is occlusion of the supraclinoid internal carotid artery on the left. Vertebral arteries: Normal Skeleton: Normal.  Periapical cyst around right upper incisor Other neck: Negative for mass or adenopathy. Upper chest: Negative Review of the MIP images confirms the above findings CTA HEAD FINDINGS Anterior circulation: Slow flow with small caliber of the left cavernous carotid which then  occludes in the supraclinoid component. Occlusion of the proximal left A1. Occlusion of left M1 segment. Occlusion of left M2 branches diffusely. Right middle cerebral artery widely patent. A2 segments patent bilaterally. Posterior circulation: Both vertebral arteries patent to the basilar. Left vertebral dominant. PICA patent bilaterally. Basilar patent. Superior cerebellar and posterior cerebral arteries patent bilaterally. Venous sinuses: Negative Anatomic variants: None Delayed phase: Not performed Review of the MIP images confirms the above findings CT Brain Perfusion Findings: CBF (<30%) Volume: 1316mPerfusion (Tmax>6.0s) volume: 17130mismatch Volume: 65m2mfarction Location:Left MCA territory including left basal ganglia. IMPRESSION: Large territory left MCA acute infarct. 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 Occlusion left A1 with reconstitution left A2. Occlusion left M1 and M2 segments. Call is currently in for Dr. McNeLeonides Schanzectronically Signed   By: CharFranchot Gallo.   On: 02/19/2018 12:59   Dg Chest 1 View  Result Date:  02/19/2018 CLINICAL DATA:  37 year old male with history of stroke. Post procedure chest x-ray. EXAM: CHEST  1 VIEW COMPARISON:  No priors. FINDINGS: An endotracheal tube is in place with tip 3.1 cm above the carina. Nasogastric tube extends into the proximal stomach. Ill-defined opacity in the right lung base which is coincident with the in divisum tubing, potentially artifactual. Lung volumes are low. No other definite consolidative airspace disease. No pleural effusions. No evidence of pulmonary edema. Heart size is normal. Upper mediastinal contours are within normal limits. IMPRESSION: 1. Support apparatus, as above. 2. Low lung volumes without definite radiographic evidence of acute cardiopulmonary disease. Electronically Signed   By: Vinnie Langton M.D.   On: 02/19/2018 16:24   Ct Head Wo Contrast  Result Date: 02/21/2018 CLINICAL DATA:  Stroke follow-up EXAM: CT HEAD WITHOUT CONTRAST TECHNIQUE: Contiguous axial images were obtained from the base of the skull through the vertex without intravenous contrast. COMPARISON:  Head CT 02/19/2018 FINDINGS: Brain: There is increased hypodensity throughout the left MCA distribution in the location of the known infarct. Hyperdense material within the infarcted region has also decreased in density, with no increased volume. Mass effect on the left lateral ventricle is increased and there is now rightward midline shift measuring 4 mm at the level of the foramina of [place]. No hydrocephalus. Right hemisphere and cerebellum are normal. Vascular: Hyperattenuation of the left middle cerebral artery. Skull: The visualized skull base, calvarium and extracranial soft tissues are normal. Sinuses/Orbits: No fluid levels or advanced mucosal thickening of the visualized paranasal sinuses. No mastoid or middle ear effusion. The orbits are normal. IMPRESSION: 1. Progression of cytotoxic edema throughout the left MCA distribution, causing slight mass effect on the left lateral ventricle with 4 mm of rightward midline shift. No hydrocephalus. 2. Decreased density in unchanged distribution of hyperattenuating material in the left MCA territory, favored to indicate resolving contrast staining. Electronically Signed   By: Ulyses Jarred M.D.   On: 02/21/2018 05:15   Ct Head Wo Contrast  Result Date: 02/19/2018 CLINICAL DATA:  Followup revascularization attempt. Left MCA territory infarction secondary to acute dissection of the left internal carotid artery. EXAM: CT HEAD WITHOUT CONTRAST TECHNIQUE: Contiguous axial images were obtained from the base of the skull through the vertex without intravenous contrast. COMPARISON:  Multiple examinations earlier today. FINDINGS: Brain: Developing left MCA territory infarction. Contrast  staining possibly admixed with hemorrhage in the insular region. Diminished contrast staining elsewhere in the region of the infarction. Early swelling but no mass effect or shift at this time. No hydrocephalus. No extra-axial collection. Vascular: Some vascular contrast still evident. Skull: Normal Sinuses/Orbits: Clear/normal Other: None IMPRESSION: Left MCA territory infarction with early low-density and swelling but no mass effect or shift at this time. I think most the hyperdensity seen previously relates to contrast staining, much of which is diminishing or resolved. There is some persistent contrast staining in the region of the insula and I could not exclude that there is a small amount of admixed hemorrhage. Electronically Signed   By: Nelson Chimes M.D.   On: 02/19/2018 20:33   Ct Head Wo Contrast  Result Date: 02/19/2018 CLINICAL DATA:  Stroke post intervention. EXAM: CT HEAD WITHOUT CONTRAST TECHNIQUE: Contiguous axial images were obtained from the base of the skull through the vertex without intravenous contrast. COMPARISON:  CT head 02/19/2018 FINDINGS: Brain: Large territory left MCA infarct has become more evident on CT with diffuse low-density throughout the left MCA territory including the basal  ganglia. Interval development of patchy areas of acute hemorrhage within the infarct involving the left frontal, left parietal, and left insular region. Chronic hemorrhage also in the left lateral basal ganglia. High density in the left posterior insula likely represents extravasation of contrast. Mild local mass-effect without midline shift. Ventricle size remains normal. No other acute infarct. Vascular: Diffuse arterial enhancement due to prior angiography in CTA Skull: Negative Sinuses/Orbits: Mild mucosal edema paranasal sinuses. Negative orbit Other: None IMPRESSION: Large territory acute infarct left MCA territory. Interval development of multiple areas of patchy hemorrhage within the infarct  following clot retrieval. No midline shift. The images were reviewed with Dr. Earleen Newport. Electronically Signed   By: Franchot Gallo M.D.   On: 02/19/2018 16:18   Ct Angio Neck W Or Wo Contrast  Result Date: 02/19/2018 CLINICAL DATA:  Stroke.  Right-sided weakness aphasia EXAM: CT ANGIOGRAPHY HEAD AND NECK CT PERFUSION BRAIN TECHNIQUE: Multidetector CT imaging of the head and neck was performed using the standard protocol during bolus administration of intravenous contrast. Multiplanar CT image reconstructions and MIPs were obtained to evaluate the vascular anatomy. Carotid stenosis measurements (when applicable) are obtained utilizing NASCET criteria, using the distal internal carotid diameter as the denominator. Multiphase CT imaging of the brain was performed following IV bolus contrast injection. Subsequent parametric perfusion maps were calculated using RAPID software. CONTRAST:  53m ISOVUE-370 IOPAMIDOL (ISOVUE-370) INJECTION 76% COMPARISON:  CT head 02/19/2018 FINDINGS: CTA NECK FINDINGS Aortic arch: Normal aortic arch without atherosclerotic disease or aneurysm. Proximal great vessels normal Right carotid system: Normal right carotid. No evidence of atherosclerotic disease dissection or stenosis Left carotid system: Irregularity and stenosis of the proximal left internal carotid artery compatible with dissection. No significant calcification. Intraluminal filling defect compatible with thrombus. Left internal carotid artery small in caliber but patent to the cavernous segment. There is occlusion of the supraclinoid internal carotid artery on the left. Vertebral arteries: Normal Skeleton: Normal.  Periapical cyst around right upper incisor Other neck: Negative for mass or adenopathy. Upper chest: Negative Review of the MIP images confirms the above findings CTA HEAD FINDINGS Anterior circulation: Slow flow with small caliber of the left cavernous carotid which then occludes in the supraclinoid component.  Occlusion of the proximal left A1. Occlusion of left M1 segment. Occlusion of left M2 branches diffusely. Right middle cerebral artery widely patent. A2 segments patent bilaterally. Posterior circulation: Both vertebral arteries patent to the basilar. Left vertebral dominant. PICA patent bilaterally. Basilar patent. Superior cerebellar and posterior cerebral arteries patent bilaterally. Venous sinuses: Negative Anatomic variants: None Delayed phase: Not performed Review of the MIP images confirms the above findings CT Brain Perfusion Findings: CBF (<30%) Volume: 1343mPerfusion (Tmax>6.0s) volume: 17119mismatch Volume: 66m45mfarction Location:Left MCA territory including left basal ganglia. IMPRESSION: Large territory left MCA acute infarct. 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 Occlusion left A1 with reconstitution left A2. Occlusion left M1 and M2 segments. Call is currently in for Dr. McNeLeonides Schanzectronically Signed   By: CharFranchot Gallo.   On: 02/19/2018 12:59   Mr Mra Jodene Namd Wo Contrast  Result Date: 02/20/2018 CLINICAL DATA:  Stroke post left MCA clot retrieval. Left carotid dissection EXAM: MRI HEAD WITHOUT CONTRAST MRA HEAD WITHOUT CONTRAST TECHNIQUE: Multiplanar, multiecho pulse sequences of the brain and surrounding structures were obtained without intravenous contrast. Angiographic images of the head were obtained using MRA technique without contrast. COMPARISON:  CT head 02/19/2018 FINDINGS: MRI HEAD FINDINGS Brain: Large territory acute infarct left MCA territory. Acute infarct throughout the left basal ganglia and entire left MCA territory. Mild amount of hemorrhage in the left frontal and parietal lobe. Diffuse edema with mass-effect and 2 mm midline shift to the right. No other areas of infarct hemorrhage or mass. Vascular: Thrombosed left internal carotid artery.  Normal flow void right internal carotid artery and posterior circulation Skull and upper cervical spine: Negative Sinuses/Orbits: Negative Other: None MRA HEAD FINDINGS Left internal carotid artery is occluded through the cervical and cavernous segment. Left middle cerebral artery is occluded including the left M1 M2 and M3 segments. Left anterior cerebral artery is patent presumably supplied from the right. Right internal carotid artery widely patent. Right anterior and middle cerebral arteries normal. Both vertebral arteries patent to the basilar. PICA patent bilaterally. Basilar widely patent. Superior cerebellar and posterior cerebral arteries normal bilaterally Negative for cerebral aneurysm IMPRESSION: Acute infarct of the entire left MCA territory with mild associated hemorrhage in the left parietal and frontal lobe. Diffuse edema. 2 mm midline shift to the right. No other infarct Occlusion of the left internal carotid artery and entire left middle cerebral artery Electronically Signed   By: Franchot Gallo M.D.   On: 02/20/2018 10:52   Mr Brain Wo Contrast  Result Date: 02/20/2018 CLINICAL DATA:  Stroke post left MCA clot retrieval. Left carotid dissection EXAM: MRI HEAD WITHOUT CONTRAST MRA HEAD WITHOUT CONTRAST TECHNIQUE: Multiplanar, multiecho pulse sequences of the brain and surrounding structures were obtained without intravenous contrast. Angiographic images of the head were obtained using MRA technique without contrast. COMPARISON:  CT head 02/19/2018 FINDINGS: MRI HEAD FINDINGS Brain: Large territory acute infarct left MCA territory. Acute infarct throughout the left basal ganglia and entire left MCA territory. Mild amount of hemorrhage in the left frontal and parietal lobe. Diffuse edema with mass-effect and 2 mm midline shift to the right. No other areas of infarct hemorrhage or mass. Vascular: Thrombosed left internal carotid artery. Normal flow void right internal carotid artery and posterior  circulation Skull and upper cervical spine: Negative Sinuses/Orbits: Negative Other: None MRA HEAD FINDINGS Left internal carotid artery is occluded through the cervical and cavernous segment. Left middle cerebral artery is occluded including the left M1 M2 and M3 segments. Left anterior cerebral artery is patent presumably supplied from the right. Right internal carotid artery widely patent. Right anterior and middle cerebral arteries normal. Both vertebral arteries patent to the basilar. PICA patent bilaterally. Basilar widely patent. Superior cerebellar and posterior cerebral arteries normal bilaterally Negative for cerebral aneurysm IMPRESSION: Acute infarct of the entire left MCA territory with mild associated hemorrhage in the left parietal and frontal lobe. Diffuse edema. 2 mm midline shift to the right. No other infarct Occlusion of the left internal carotid artery and entire left middle cerebral artery Electronically Signed   By: Franchot Gallo M.D.   On: 02/20/2018 10:52   [place]  Result Date: 02/20/2018 INDICATION: 36 year old male with a history of acute left MCA stroke Given the patient's age and baseline function, thrombectomy was offered to the patient's family as a possibility of improving his overall function and giving him the best chance of recovery, accepting a high risk of intracranial hemorrhage/malignant edema given the size of the infarction. EXAM: ULTRASOUND GUIDED ACCESS RIGHT COMMON FEMORAL ARTERY CEREBRAL ANGIOGRAM MECHANICAL THROMBECTOMY LEFT MCA CLOSURE OF ACCESS SITE WITH ANGIO-SEAL DEVICE COMPARISON:  CT 02/19/2018, CT ANGIOGRAM 02/19/2018 MEDICATIONS: 2  g Ancef. The antibiotic was administered within 1 hour of the procedure ANESTHESIA/SEDATION: GENERAL ENDOTRACHEAL TUBE ANESTHESIA WITH THE ANESTHESIA TEAM CONTRAST:  140 cc Isovue FLUOROSCOPY TIME:  Fluoroscopy Time: 35 minutes 6 seconds (2373 mGy). COMPLICATIONS: None TECHNIQUE: Informed written consent was obtained from  the patient's family after a thorough discussion of the procedural risks, benefits and alternatives. Specific risks discussed include: Bleeding, infection, contrast reaction, kidney injury/failure, need for further procedure/surgery, arterial injury or dissection, embolization to new territory, intracranial hemorrhage (10-15% risk), neurologic deterioration, cardiopulmonary collapse, death. All questions were addressed. Maximal Sterile Barrier Technique was utilized including during the procedure including caps, mask, sterile gowns, sterile gloves, sterile drape, hand hygiene and skin antiseptic. A timeout was performed prior to the initiation of the procedure. The anesthesia team was present to provide general endotracheal tube anesthesia and for patient monitoring during the procedure. Interventional neuro radiology nursing staff was also present. FINDINGS: Ultrasound images were performed of the right inguinal region, confirming patency of the right common femoral artery. Images were stored and sent to PACs. Initial Findings: Left common carotid artery:  Normal course caliber and contour. Left external carotid artery: Patent with antegrade flow. Left internal carotid artery: There is irregular appearance of the posterior proximal internal carotid artery extending from the carotid bulb along the proximal 3rd of the right internal carotid artery. This results in at least 50 percent narrowing, with irregular blunted appearance on the distal filling defect, compatible with thrombus/dissection. The mid and distal cervical segment of the ICA is patent. Unremarkable petrous segment, lacerum segment, cavernous segment, clinoid segment/ophthalmic segment and terminal segment. Left MCA: MCA occluded at the origin with a reversed meniscus suggesting acute thrombus. Patent anterior choroidal artery and a small posterior communicating artery, which washes out from collateral flow from the posterior. The initial injection  demonstrates patent meningo-hypophyseal branch, with sellar blush. Left ACA: A 1 segment patent. A 2 segment perfuses the left territory. Patent anterior communicating artery with transient perfusion of the right ACA territory. Completion Findings: Left MCA: After mechanical thrombectomy, there is reperfusion of the middle cerebral artery, with slow flow through the insular branches, TICI 2b. Early venous drainage is visualized. Upon withdrawal into the common carotid artery, there is occlusion of the proximal cervical ICA secondary to acute dissection/thrombus. External carotid artery remains patent, with early perfusion changes of external to internal collateralization, as there is filling of the carotid siphon via ascending cervical. Right common carotid artery:  Normal course caliber and contour. Right external carotid artery: Patent with antegrade flow. Right internal carotid artery: Normal course caliber and contour of the cervical portion. Vertical and petrous segment patent with normal course caliber contour. Lacerum segment patent. cavernous segment patent. Clinoid segment patent. Antegrade flow of the ophthalmic artery. Ophthalmic segment patent. Terminus patent. Crossover flow into the left hemisphere, with perfusion of both the ACA territory and the MCA territory at the conclusion. Right MCA: M1 segment patent. Insular and opercular segments patent. Unremarkable caliber and course of the cortical segments. Typical arterial, capillary/ parenchymal, and venous phase. Right ACA: A 1 segment patent. A 2 segment perfuses the right territory. Right vertebral: Cervical vertebral artery have normal course caliber and contour with expected filling of the C3, C2, C1 branches. The dominant runoff of the right vertebral artery is into a right posterior inferior cerebellar artery, with a small right vertebral artery to basilar contribution. The majority of flow into the basilar from the right is washed out from a  left vertebral  artery inflow. Left vertebral: Normal course caliber and contour of left vertebral artery. Dominant left vertebral artery. Expected filling of the C3, C2, C1 branches. Dominant vertebral artery contributes to basilar artery, with patent left posterior inferior cerebellar artery, patent bilateral superior cerebellar artery, and patent bilateral posterior cerebral artery, with symmetric opacification from the left vertebral injection. The left posterior cerebral artery contributes to perfusion of the left temporal lobe and occipital lobe. Flat panel CT: CT demonstrates no significant mass effect, however, at the conclusion there is evidence of contrast staining of the left MCA territory gyri as well as a combination of ICH/SAH at the left basal ganglia and sylvian fissure. Evidence of involving left MCA infarction. PROCEDURE: Patient is brought emergently to the neuro angiography suite, with the patient identified appropriately and placed supine position on the table. Left radial arterial line was placed by the anesthesia team. The patient is then prepped and draped in the usual sterile fashion. Ultrasound survey of the right inguinal region was performed with images stored and sent to PACs, confirming patency of the common femoral artery. 11 blade scalpel was used to make a small incision. Blunt dissection was performed. A micropuncture needle was used access the right common femoral artery under ultrasound. With excellent arterial blood flow returned, an .018 micro wire was passed through the needle, observed to enter the abdominal aorta under fluoroscopy. The needle was removed, and a micropuncture sheath was placed over the wire. The inner dilator and wire were removed, and an 035 Bentson wire was advanced under fluoroscopy into the abdominal aorta. The sheath was removed and a standard 5 [place] vascular sheath was placed. The dilator was removed and the sheath was flushed. A 36F JB-1 diagnostic  catheter was advanced over the wire to the proximal descending thoracic aorta. Wire was then removed. Double flush of the catheter was performed. Catheter was then used to select the left common carotid artery. Formal angiogram was performed of the cervical and cerebral vessels. Glidewire was then used to navigate across the dissected proximal cervical ICA. Diagnostic catheter was then advanced into the distal cervical ICA. Exchange length Rosen wire was then passed through the diagnostic catheter to the distal cervical ICA and the diagnostic catheter was removed. The 5 French sheath was removed and exchanged for 8 French 55 centimeter BrightTip sheath. Sheath was flushed and attached to pressurized and heparinized saline bag for constant forward flow. Then a neuron Max 80cm catheter was prepared on the back table. Ace 68 intermediate catheter was then loaded though the Neuron Max catheter with coaxial microcatheter, and advanced through the guide catheter. Under roadmap angiogram, Microcatheter system was then introduced through the Ace catheter using a synchro soft 014 wire and a Trevo Provue18 catheter. Microcatheter system was advanced into the internal carotid artery, to the level of the occlusion. The micro wire and the microcatheter were carefully placed at the proximal occlusion, and the aspiration catheter was advanced to the occlusion. Microwire in the microcatheter were withdrawn, an aspiration was performed. Aspiration was initiated, confirming return of blood flow. The catheter was then advanced into the occlusion with stasis of blood flow returned. With no blood flow returned after 30 seconds-60 seconds of aspiration, the aspiration catheter was removed entirely from the neuron max. This catheter was then flushed, with clot retrieved. Repeat angiogram performed. Further targets with thrombus were identified within M2 branches. The aspiration catheter was then used as a intermediate catheter, with the  microcatheter advanced with the synchro wire.  Under road map angiogram, the microcatheter and microwire were advanced beyond the M2 thrombus. The wire was removed and gentle aspiration was performed confirming blood flow returned. Gentle injection was performed to prove intraluminal location. A rotating hemostatic valve was then attached to the back end of the microcatheter, and a pressurized and heparinized saline bag was attached to the catheter. 4 x 40 solitaire device was then selected. Back flush was achieved at the rotating hemostatic valve, and then the device was gently advanced through the microcatheter to the distal end. The retriever was then unsheathed by withdrawing the microcatheter under fluoroscopy. Once the retriever was completely unsheathed, control angiogram was performed from the balloon catheter. Constant aspiration was then performed through the Ace catheter as the retriever was gently and slowly withdrawn with fluoroscopic observation. Once the retriever was entirely removed from the system, free aspiration was confirmed at the hub of the intermediate catheter, with free blood return confirmed. Control angiogram was then performed. Persistent occlusion at the proximal M2 branch was confirmed. The microcatheter system was then again advanced through the Ace catheter. Once the micro wire microcatheter were beyond the M2 occlusion, the micro wire was removed and solitaire 4 x 40 device was deployed. After the solitaire was deployed across the occlusion, the Ace catheter was advanced into the M1 segment at the site of the occlusion. Local aspiration was performed upon withdrawal of the solitaire device under fluoroscopic observation. Once the retrieve her was entirely removed from the system, free aspiration was confirmed at the hub of the intermediate catheter, with free blood return confirmed. Ace aspiration catheter was removed. Control angiogram was again performed. 035 rose in wire was then  advanced through the neuron max into the cervical segment. The neuron max was withdrawn proximal to the bifurcation with repeat angiogram performed. At this point, the results and evolution of the case as well as the patient management was thoroughly discussed with the neurology team. We elected at this time to perform flat panel CT. After the results demonstrated presence of subarachnoid/ICH, we elected not to place a carotid stent. The neuro on max was removed. Completion cerebral angiogram was then performed of the right carotid system and the bilateral vertebral arteries. Catheter was removed, and the bright tip sheath was exchanged for a short 8 French sheath at the right common femoral artery. Eight [place] Angio-Seal was deployed for hemostasis. Patient tolerated the procedure well and remained hemodynamically stable throughout. No complications were encountered. IMPRESSION: Status post ultrasound guided access right common femoral artery for cerebral angiogram and mechanical thrombectomy of left MCA ELVO, achieving TICI 2b reperfusion after 3 passes with a combination of ADAPT technique and SOLUMBRA technique using a 4x40 solitaire. Status post deployment of Angio-Seal for hemostasis at the access site. Cerebral angiogram confirms acute left ICA dissection as the source of the intracranial emboli, with cervical ICA occluded at the case conclusion. Signed, Dulcy Fanny. Dellia Nims, RPVI [facility] Radiology PLAN: Given the presence of hemorrhage, the patient will stay intubated. Straight to CT for baseline CT scan. Right common femoral artery has been closed with Angio-Seal. Local wound care and right hip straight for 4 hours. The patient will be admitted to the neuro ICU. Target blood pressure goal of less than 867 systolic Electronically Signed   By: Corrie Mckusick D.O.   On: 02/20/2018 14:20   Ir US Guide Vasc Access Right  Result Date: 02/21/2018 INDICATION:  37 year old male with a history of acute  left MCA stroke  Given the patient's age and baseline function, thrombectomy was offered to the patient's family as a possibility of improving his overall function and giving him the best chance of recovery, accepting a high risk of intracranial hemorrhage/malignant edema given the size of the infarction.  EXAM: ULTRASOUND GUIDED ACCESS RIGHT COMMON FEMORAL ARTERY  CEREBRAL ANGIOGRAM  MECHANICAL THROMBECTOMY LEFT MCA  CLOSURE OF ACCESS SITE WITH ANGIO-SEAL DEVICE  COMPARISON:  CT 02/19/2018, CT ANGIOGRAM 02/19/2018  MEDICATIONS: 2 g Ancef. The antibiotic was administered within 1 hour of the procedure  ANESTHESIA/SEDATION: GENERAL ENDOTRACHEAL TUBE ANESTHESIA WITH THE ANESTHESIA TEAM  CONTRAST:  140 cc Isovue  FLUOROSCOPY TIME:  Fluoroscopy Time: 35 minutes 6 seconds (2373 mGy).  COMPLICATIONS: None  TECHNIQUE: Informed written consent was obtained from the patient's family after a thorough discussion of the procedural risks, benefits and alternatives. Specific risks discussed include: Bleeding, infection, contrast reaction, kidney injury/failure, need for further procedure/surgery, arterial injury or dissection, embolization to new territory, intracranial hemorrhage (10-15% risk), neurologic deterioration, cardiopulmonary collapse, death. All questions were addressed. Maximal Sterile Barrier Technique was utilized including during the procedure including caps, mask, sterile gowns, sterile gloves, sterile drape, hand hygiene and skin antiseptic. A timeout was performed prior to the initiation of the procedure.  The anesthesia team was present to provide general endotracheal tube anesthesia and for patient monitoring during the procedure. Interventional neuro radiology nursing staff was also present.  FINDINGS: Ultrasound images were performed of the right inguinal region, confirming patency of the right common femoral artery. Images were stored and sent to PACs.   Initial Findings:  Left common carotid artery:  Normal course caliber and contour.  Left external carotid artery: Patent with antegrade flow.  Left internal carotid artery: There is irregular appearance of the posterior proximal internal carotid artery extending from the carotid bulb along the proximal 3rd of the right internal carotid artery. This results in at least 50 percent narrowing, with irregular blunted appearance on the distal filling defect, compatible with thrombus/dissection.  The mid and distal cervical segment of the ICA is patent. Unremarkable petrous segment, lacerum segment, cavernous segment, clinoid segment/ophthalmic segment and terminal segment.  Left MCA: MCA occluded at the origin with a reversed meniscus suggesting acute thrombus. Patent anterior choroidal artery and a small posterior communicating artery, which washes out from collateral flow from the posterior. The initial injection demonstrates patent meningo-hypophyseal branch, with sellar blush.  Left ACA: A 1 segment patent. A 2 segment perfuses the left territory. Patent anterior communicating artery with transient perfusion of the right ACA territory.  Completion Findings:  Left MCA: After mechanical thrombectomy, there is reperfusion of the middle cerebral artery, with slow flow through the insular branches, TICI 2b.  Early venous drainage is visualized.  Upon withdrawal into the common carotid artery, there is occlusion of the proximal cervical ICA secondary to acute dissection/thrombus. External carotid artery remains patent, with early perfusion changes of external to internal collateralization, as there is filling of the carotid siphon via ascending cervical.  Right common carotid artery:  Normal course caliber and contour.  Right external carotid artery: Patent with antegrade flow.  Right internal carotid artery: Normal course caliber and contour of the cervical portion. Vertical and petrous segment patent with  normal course caliber contour. Lacerum segment patent. cavernous segment patent. Clinoid segment patent. Antegrade flow of the ophthalmic artery. Ophthalmic segment patent. Terminus patent. Crossover flow into the left hemisphere, with perfusion of both the ACA territory and the MCA  territory at the conclusion.  Right MCA: M1 segment patent. Insular and opercular segments patent. Unremarkable caliber and course of the cortical segments. Typical arterial, capillary/ parenchymal, and venous phase.  Right ACA: A 1 segment patent. A 2 segment perfuses the right territory.  Right vertebral:  Cervical vertebral artery have normal course caliber and contour with expected filling of the C3, C2, C1 branches. The dominant runoff of the right vertebral artery is into a right posterior inferior cerebellar artery, with a small right vertebral artery to basilar contribution. The majority of flow into the basilar from the right is washed out from a left vertebral artery inflow.  Left vertebral:  Normal course caliber and contour of left vertebral artery. Dominant left vertebral artery. Expected filling of the C3, C2, C1 branches.  Dominant vertebral artery contributes to basilar artery, with patent left posterior inferior cerebellar artery, patent bilateral superior cerebellar artery, and patent bilateral posterior cerebral artery, with symmetric opacification from the left vertebral injection. The left posterior cerebral artery contributes to perfusion of the left temporal lobe and occipital lobe.  Flat panel CT:  CT demonstrates no significant mass effect, however, at the conclusion there is evidence of contrast staining of the left MCA territory gyri as well as a combination of ICH/SAH at the left basal ganglia and sylvian fissure. Evidence of involving left MCA infarction.  PROCEDURE: Patient is brought emergently to the neuro angiography suite, with the patient identified appropriately and placed supine position on  the table. Left radial arterial line was placed by the anesthesia team.  The patient is then prepped and draped in the usual sterile fashion.  Ultrasound survey of the right inguinal region was performed with images stored and sent to PACs, confirming patency of the common femoral artery.  11 blade scalpel was used to make a small incision. Blunt dissection was performed. A micropuncture needle was used access the right common femoral artery under ultrasound. With excellent arterial blood flow returned, an .018 micro wire was passed through the needle, observed to enter the abdominal aorta under fluoroscopy. The needle was removed, and a micropuncture sheath was placed over the wire. The inner dilator and wire were removed, and an 035 Bentson wire was advanced under fluoroscopy into the abdominal aorta. The sheath was removed and a standard 5 [place] vascular sheath was placed. The dilator was removed and the sheath was flushed.  A 102F JB-1 diagnostic catheter was advanced over the wire to the proximal descending thoracic aorta. Wire was then removed. Double flush of the catheter was performed. Catheter was then used to select the left common carotid artery.  Formal angiogram was performed of the cervical and cerebral vessels.  Glidewire was then used to navigate across the dissected proximal cervical ICA. Diagnostic catheter was then advanced into the distal cervical ICA.  Exchange length Rosen wire was then passed through the diagnostic catheter to the distal cervical ICA and the diagnostic catheter was removed. The 5 French sheath was removed and exchanged for 8 French 55 centimeter BrightTip sheath. Sheath was flushed and attached to pressurized and heparinized saline bag for constant forward flow. Then a neuron Max 80cm catheter was prepared on the back table.  Ace 68 intermediate catheter was then loaded though the Neuron Max catheter with coaxial microcatheter, and advanced through the guide catheter.   Under roadmap angiogram, Microcatheter system was then introduced through the Ace catheter using a synchro soft 014 wire and a Trevo Provue18 catheter. Microcatheter system was advanced into  the internal carotid artery, to the level of the occlusion. The micro wire and the microcatheter were carefully placed at the proximal occlusion, and the aspiration catheter was advanced to the occlusion. Microwire in the microcatheter were withdrawn, an aspiration was performed.  Aspiration was initiated, confirming return of blood flow. The catheter was then advanced into the occlusion with stasis of blood flow returned.  With no blood flow returned after 30 seconds-60 seconds of aspiration, the aspiration catheter was removed entirely from the neuron max. This catheter was then flushed, with clot retrieved.  Repeat angiogram performed. Further targets with thrombus were identified within M2 branches.  The aspiration catheter was then used as a intermediate catheter, with the microcatheter advanced with the synchro wire. Under road map angiogram, the microcatheter and microwire were advanced beyond the M2 thrombus. The wire was removed and gentle aspiration was performed confirming blood flow returned. Gentle injection was performed to prove intraluminal location.  A rotating hemostatic valve was then attached to the back end of the microcatheter, and a pressurized and heparinized saline bag was attached to the catheter.  4 x 40 solitaire device was then selected. Back flush was achieved at the rotating hemostatic valve, and then the device was gently advanced through the microcatheter to the distal end. The retriever was then unsheathed by withdrawing the microcatheter under fluoroscopy. Once the retriever was completely unsheathed, control angiogram was performed from the balloon catheter.  Constant aspiration was then performed through the Ace catheter as the retriever was gently and slowly withdrawn with  fluoroscopic observation. Once the retriever was entirely removed from the system, free aspiration was confirmed at the hub of the intermediate catheter, with free blood return confirmed.  Control angiogram was then performed. Persistent occlusion at the proximal M2 branch was confirmed.  The microcatheter system was then again advanced through the Ace catheter.  Once the micro wire microcatheter were beyond the M2 occlusion, the micro wire was removed and solitaire 4 x 40 device was deployed. After the solitaire was deployed across the occlusion, the Ace catheter was advanced into the M1 segment at the site of the occlusion.  Local aspiration was performed upon withdrawal of the solitaire device under fluoroscopic observation. Once the retrieve her was entirely removed from the system, free aspiration was confirmed at the hub of the intermediate catheter, with free blood return confirmed.  Ace aspiration catheter was removed.  Control angiogram was again performed.  035 rose in wire was then advanced through the neuron max into the cervical segment. The neuron max was withdrawn proximal to the bifurcation with repeat angiogram performed.  At this point, the results and evolution of the case as well as the patient management was thoroughly discussed with the neurology team. We elected at this time to perform flat panel CT. After the results demonstrated presence of subarachnoid/ICH, we elected not to place a carotid stent.  The neuro on max was removed.  Completion cerebral angiogram was then performed of the right carotid system and the bilateral vertebral arteries.  Catheter was removed, and the bright tip sheath was exchanged for a short 8 French sheath at the right common femoral artery.  Eight [place] Angio-Seal was deployed for hemostasis.  Patient tolerated the procedure well and remained hemodynamically stable throughout.  No complications were encountered.  IMPRESSION: Status post ultrasound  guided access right common femoral artery for cerebral angiogram and mechanical thrombectomy of left MCA ELVO, achieving TICI 2b reperfusion after 3 passes with a combination  of ADAPT technique and SOLUMBRA technique using a 4x40 solitaire.  Status post deployment of Angio-Seal for hemostasis at the access site.  Cerebral angiogram confirms acute left ICA dissection as the source of the intracranial emboli, with cervical ICA occluded at the case conclusion.  Signed,  Dulcy Fanny. Dellia Nims, RPVI  [facility]  [facility] Radiology  PLAN: Given the presence of hemorrhage, the patient will stay intubated.  Straight to CT for baseline CT scan.  Right common femoral artery has been closed with Angio-Seal. Local wound care and right hip straight for 4 hours.  The patient will be admitted to the neuro ICU.  Target blood pressure goal of less than 401 systolic   Electronically Signed   By: Corrie Mckusick D.O.   On: 02/20/2018 14:20  Ct Cerebral Perfusion W Contrast  Result Date: 02/19/2018 CLINICAL DATA:  Stroke.  Right-sided weakness aphasia EXAM: CT ANGIOGRAPHY HEAD AND NECK CT PERFUSION BRAIN TECHNIQUE: Multidetector CT imaging of the head and neck was performed using the standard protocol during bolus administration of intravenous contrast. Multiplanar CT image reconstructions and MIPs were obtained to evaluate the vascular anatomy. Carotid stenosis measurements (when applicable) are obtained utilizing NASCET criteria, using the distal internal carotid diameter as the denominator. Multiphase CT imaging of the brain was performed following IV bolus contrast injection. Subsequent parametric perfusion maps were calculated using RAPID software. CONTRAST:  65m ISOVUE-370 IOPAMIDOL (ISOVUE-370) INJECTION 76% COMPARISON:  CT head 02/19/2018 FINDINGS: CTA NECK FINDINGS Aortic arch: Normal aortic arch without atherosclerotic disease or aneurysm. Proximal great vessels normal  Right carotid system: Normal right carotid. No evidence of atherosclerotic disease dissection or stenosis Left carotid system: Irregularity and stenosis of the proximal left internal carotid artery compatible with dissection. No significant calcification. Intraluminal filling defect compatible with thrombus. Left internal carotid artery small in caliber but patent to the cavernous segment. There is occlusion of the supraclinoid internal carotid artery on the left. Vertebral arteries: Normal Skeleton: Normal.  Periapical cyst around right upper incisor Other neck: Negative for mass or adenopathy. Upper chest: Negative Review of the MIP images confirms the above findings CTA HEAD FINDINGS Anterior circulation: Slow flow with small caliber of the left cavernous carotid which then occludes in the supraclinoid component. Occlusion of the proximal left A1. Occlusion of left M1 segment. Occlusion of left M2 branches diffusely. Right middle cerebral artery widely patent. A2 segments patent bilaterally. Posterior circulation: Both vertebral arteries patent to the basilar. Left vertebral dominant. PICA patent bilaterally. Basilar patent. Superior cerebellar and posterior cerebral arteries patent bilaterally. Venous sinuses: Negative Anatomic variants: None Delayed phase: Not performed Review of the MIP images confirms the above findings CT Brain Perfusion Findings: CBF (<30%) Volume: 131mPerfusion (Tmax>6.0s) volume: 17169mismatch Volume: 69m23mfarction Location:Left MCA territory including left basal ganglia. IMPRESSION: Large territory left MCA acute infarct. 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 Occlusion left A1 with reconstitution left A2. Occlusion left M1 and M2 segments. Call is currently in for Dr. McNeLeonides Schanzectronically Signed   By: CharFranchot Gallo.   On: 02/19/2018 12:59   Dg  Chest Port 1 View  Result Date: 02/21/2018 CLINICAL DATA:  Hypoxia EXAM: PORTABLE CHEST 1 VIEW COMPARISON:  February 20, 2018 FINDINGS: Endotracheal tube tip is 4.0 cm above the carina. Nasogastric tube tip and side port are below the diaphragm. Central catheter tip is at the  cavoatrial junction. No pneumothorax. There is atelectatic change in the lung bases. Lungs elsewhere clear. Heart is upper normal in size with pulmonary vascularity normal. No adenopathy. No bone lesions. IMPRESSION: Tube and catheter positions as described without pneumothorax. Lower lobe atelectatic change. No edema or consolidation. Stable cardiac silhouette. Electronically Signed   By: Lowella Grip III M.D.   On: 02/21/2018 07:25   Dg Chest Port 1 View  Result Date: 02/20/2018 CLINICAL DATA:  Respiratory failure,central line placement EXAM: PORTABLE CHEST 1 VIEW COMPARISON:  02/20/2018 FINDINGS: Endotracheal tube is in place, tip approximately 7.1 centimeters above the carina. Nasogastric tube is in place, tip beyond the level of the mid stomach. A LEFT IJ central line tip overlies the superior vena cava and is new since the previous exam. There is no pneumothorax. Heart size is normal. Mild subsegmental atelectasis in the MEDIAL LEFT lung base. Remote RIGHT rib fracture. IMPRESSION: Interval placement of LEFT IJ central line.  No pneumothorax. LEFT LOWER lobe atelectasis. Electronically Signed   By: Nolon Nations M.D.   On: 02/20/2018 16:50   Dg Chest Port 1 View  Result Date: 02/20/2018 CLINICAL DATA:  Endotracheal tube placement EXAM: PORTABLE CHEST 1 VIEW COMPARISON:  02/20/2018 FINDINGS: Endotracheal tube 6.5 cm above the carina unchanged from earlier today. NG tube enters the stomach with side hole in the distal esophagus similar to the prior study. Mild bibasilar atelectasis similar to the prior study. Negative for edema or effusion IMPRESSION: Endotracheal tube 6.5 cm above the carina unchanged NG tube in the distal  esophagus unchanged Mild bibasilar atelectasis unchanged Electronically Signed   By: Franchot Gallo M.D.   On: 02/20/2018 15:34   Dg Chest Port 1 View  Result Date: 02/20/2018 CLINICAL DATA:  37 year old male with respiratory failure status post left internal carotid artery dissection and left MCA territory infarct. EXAM: PORTABLE CHEST 1 VIEW COMPARISON:  Prior chest x-ray 02/19/2018 FINDINGS: The patient is intubated. The tip of the endotracheal tube is 6.8 cm above the carina. A nasogastric tube is present. The tip of the tube overlies the GE junction and is not within the stomach. Low inspiratory volumes with perhaps minimal bibasilar atelectasis. The lungs are otherwise clear. Remote healed right seventh rib fracture. IMPRESSION: 1. The tip of the nasogastric tube overlies the gastroesophageal junction and is not within the stomach. If the intends to decompress the stomach, recommend advancing 10 cm. 2. Low inspiratory volumes with minimal bibasilar atelectasis. 3. The tip of the endotracheal tube is 6.7 cm above the carina. These results will be called to the ordering clinician or representative by the Radiologist Assistant, and communication documented in the PACS or zVision Dashboard. Electronically Signed   By: Jacqulynn Cadet M.D.   On: 02/20/2018 08:55   Ir Percutaneous Art Thrombectomy/infusion Intracranial Inc Diag Angio  Result Date: 02/20/2018 INDICATION: 37 year old male with a history of acute left MCA stroke Given the patient's age and baseline function, thrombectomy was offered to the patient's family as a possibility of improving his overall function and giving him the best chance of recovery, accepting a high risk of intracranial hemorrhage/malignant edema given the size of the infarction. EXAM: ULTRASOUND GUIDED ACCESS RIGHT COMMON FEMORAL ARTERY CEREBRAL ANGIOGRAM MECHANICAL THROMBECTOMY LEFT MCA CLOSURE OF ACCESS SITE WITH ANGIO-SEAL DEVICE COMPARISON:  CT 02/19/2018, CT ANGIOGRAM  02/19/2018 MEDICATIONS: 2 g Ancef. The antibiotic was administered within 1 hour of the procedure ANESTHESIA/SEDATION: GENERAL ENDOTRACHEAL TUBE ANESTHESIA WITH THE ANESTHESIA TEAM CONTRAST:  140 cc Isovue FLUOROSCOPY  TIME:  Fluoroscopy Time: 35 minutes 6 seconds (2373 mGy). COMPLICATIONS: None TECHNIQUE: Informed written consent was obtained from the patient's family after a thorough discussion of the procedural risks, benefits and alternatives. Specific risks discussed include: Bleeding, infection, contrast reaction, kidney injury/failure, need for further procedure/surgery, arterial injury or dissection, embolization to new territory, intracranial hemorrhage (10-15% risk), neurologic deterioration, cardiopulmonary collapse, death. All questions were addressed. Maximal Sterile Barrier Technique was utilized including during the procedure including caps, mask, sterile gowns, sterile gloves, sterile drape, hand hygiene and skin antiseptic. A timeout was performed prior to the initiation of the procedure. The anesthesia team was present to provide general endotracheal tube anesthesia and for patient monitoring during the procedure. Interventional neuro radiology nursing staff was also present. FINDINGS: Ultrasound images were performed of the right inguinal region, confirming patency of the right common femoral artery. Images were stored and sent to PACs. Initial Findings: Left common carotid artery:  Normal course caliber and contour. Left external carotid artery: Patent with antegrade flow. Left internal carotid artery: There is irregular appearance of the posterior proximal internal carotid artery extending from the carotid bulb along the proximal 3rd of the right internal carotid artery. This results in at least 50 percent narrowing, with irregular blunted appearance on the distal filling defect, compatible with thrombus/dissection. The mid and distal cervical segment of the ICA is patent. Unremarkable petrous  segment, lacerum segment, cavernous segment, clinoid segment/ophthalmic segment and terminal segment. Left MCA: MCA occluded at the origin with a reversed meniscus suggesting acute thrombus. Patent anterior choroidal artery and a small posterior communicating artery, which washes out from collateral flow from the posterior. The initial injection demonstrates patent meningo-hypophyseal branch, with sellar blush. Left ACA: A 1 segment patent. A 2 segment perfuses the left territory. Patent anterior communicating artery with transient perfusion of the right ACA territory. Completion Findings: Left MCA: After mechanical thrombectomy, there is reperfusion of the middle cerebral artery, with slow flow through the insular branches, TICI 2b. Early venous drainage is visualized. Upon withdrawal into the common carotid artery, there is occlusion of the proximal cervical ICA secondary to acute dissection/thrombus. External carotid artery remains patent, with early perfusion changes of external to internal collateralization, as there is filling of the carotid siphon via ascending cervical. Right common carotid artery:  Normal course caliber and contour. Right external carotid artery: Patent with antegrade flow. Right internal carotid artery: Normal course caliber and contour of the cervical portion. Vertical and petrous segment patent with normal course caliber contour. Lacerum segment patent. cavernous segment patent. Clinoid segment patent. Antegrade flow of the ophthalmic artery. Ophthalmic segment patent. Terminus patent. Crossover flow into the left hemisphere, with perfusion of both the ACA territory and the MCA territory at the conclusion. Right MCA: M1 segment patent. Insular and opercular segments patent. Unremarkable caliber and course of the cortical segments. Typical arterial, capillary/ parenchymal, and venous phase. Right ACA: A 1 segment patent. A 2 segment perfuses the right territory. Right vertebral: Cervical  vertebral artery have normal course caliber and contour with expected filling of the C3, C2, C1 branches. The dominant runoff of the right vertebral artery is into a right posterior inferior cerebellar artery, with a small right vertebral artery to basilar contribution. The majority of flow into the basilar from the right is washed out from a left vertebral artery inflow. Left vertebral: Normal course caliber and contour of left vertebral artery. Dominant left vertebral artery. Expected filling of the C3, C2, C1 branches. Dominant vertebral artery  contributes to basilar artery, with patent left posterior inferior cerebellar artery, patent bilateral superior cerebellar artery, and patent bilateral posterior cerebral artery, with symmetric opacification from the left vertebral injection. The left posterior cerebral artery contributes to perfusion of the left temporal lobe and occipital lobe. Flat panel CT: CT demonstrates no significant mass effect, however, at the conclusion there is evidence of contrast staining of the left MCA territory gyri as well as a combination of ICH/SAH at the left basal ganglia and sylvian fissure. Evidence of involving left MCA infarction. PROCEDURE: Patient is brought emergently to the neuro angiography suite, with the patient identified appropriately and placed supine position on the table. Left radial arterial line was placed by the anesthesia team. The patient is then prepped and draped in the usual sterile fashion. Ultrasound survey of the right inguinal region was performed with images stored and sent to PACs, confirming patency of the common femoral artery. 11 blade scalpel was used to make a small incision. Blunt dissection was performed. A micropuncture needle was used access the right common femoral artery under ultrasound. With excellent arterial blood flow returned, an .018 micro wire was passed through the needle, observed to enter the abdominal aorta under fluoroscopy. The  needle was removed, and a micropuncture sheath was placed over the wire. The inner dilator and wire were removed, and an 035 Bentson wire was advanced under fluoroscopy into the abdominal aorta. The sheath was removed and a standard 5 [place] vascular sheath was placed. The dilator was removed and the sheath was flushed. A 31F JB-1 diagnostic catheter was advanced over the wire to the proximal descending thoracic aorta. Wire was then removed. Double flush of the catheter was performed. Catheter was then used to select the left common carotid artery. Formal angiogram was performed of the cervical and cerebral vessels. Glidewire was then used to navigate across the dissected proximal cervical ICA. Diagnostic catheter was then advanced into the distal cervical ICA. Exchange length Rosen wire was then passed through the diagnostic catheter to the distal cervical ICA and the diagnostic catheter was removed. The 5 French sheath was removed and exchanged for 8 French 55 centimeter BrightTip sheath. Sheath was flushed and attached to pressurized and heparinized saline bag for constant forward flow. Then a neuron Max 80cm catheter was prepared on the back table. Ace 68 intermediate catheter was then loaded though the Neuron Max catheter with coaxial microcatheter, and advanced through the guide catheter. Under roadmap angiogram, Microcatheter system was then introduced through the Ace catheter using a synchro soft 014 wire and a Trevo Provue18 catheter. Microcatheter system was advanced into the internal carotid artery, to the level of the occlusion. The micro wire and the microcatheter were carefully placed at the proximal occlusion, and the aspiration catheter was advanced to the occlusion. Microwire in the microcatheter were withdrawn, an aspiration was performed. Aspiration was initiated, confirming return of blood flow. The catheter was then advanced into the occlusion with stasis of blood flow returned. With no blood  flow returned after 30 seconds-60 seconds of aspiration, the aspiration catheter was removed entirely from the neuron max. This catheter was then flushed, with clot retrieved. Repeat angiogram performed. Further targets with thrombus were identified within M2 branches. The aspiration catheter was then used as a intermediate catheter, with the microcatheter advanced with the synchro wire. Under road map angiogram, the microcatheter and microwire were advanced beyond the M2 thrombus. The wire was removed and gentle aspiration was performed confirming blood flow returned. Gentle  injection was performed to prove intraluminal location. A rotating hemostatic valve was then attached to the back end of the microcatheter, and a pressurized and heparinized saline bag was attached to the catheter. 4 x 40 solitaire device was then selected. Back flush was achieved at the rotating hemostatic valve, and then the device was gently advanced through the microcatheter to the distal end. The retriever was then unsheathed by withdrawing the microcatheter under fluoroscopy. Once the retriever was completely unsheathed, control angiogram was performed from the balloon catheter. Constant aspiration was then performed through the Ace catheter as the retriever was gently and slowly withdrawn with fluoroscopic observation. Once the retriever was entirely removed from the system, free aspiration was confirmed at the hub of the intermediate catheter, with free blood return confirmed. Control angiogram was then performed. Persistent occlusion at the proximal M2 branch was confirmed. The microcatheter system was then again advanced through the Ace catheter. Once the micro wire microcatheter were beyond the M2 occlusion, the micro wire was removed and solitaire 4 x 40 device was deployed. After the solitaire was deployed across the occlusion, the Ace catheter was advanced into the M1 segment at the site of the occlusion. Local aspiration was  performed upon withdrawal of the solitaire device under fluoroscopic observation. Once the retrieve her was entirely removed from the system, free aspiration was confirmed at the hub of the intermediate catheter, with free blood return confirmed. Ace aspiration catheter was removed. Control angiogram was again performed. 035 rose in wire was then advanced through the neuron max into the cervical segment. The neuron max was withdrawn proximal to the bifurcation with repeat angiogram performed. At this point, the results and evolution of the case as well as the patient management was thoroughly discussed with the neurology team. We elected at this time to perform flat panel CT. After the results demonstrated presence of subarachnoid/ICH, we elected not to place a carotid stent. The neuro on max was removed. Completion cerebral angiogram was then performed of the right carotid system and the bilateral vertebral arteries. Catheter was removed, and the bright tip sheath was exchanged for a short 8 French sheath at the right common femoral artery. Eight [place] Angio-Seal was deployed for hemostasis. Patient tolerated the procedure well and remained hemodynamically stable throughout. No complications were encountered. IMPRESSION: Status post ultrasound guided access right common femoral artery for cerebral angiogram and mechanical thrombectomy of left MCA ELVO, achieving TICI 2b reperfusion after 3 passes with a combination of ADAPT technique and SOLUMBRA technique using a 4x40 solitaire. Status post deployment of Angio-Seal for hemostasis at the access site. Cerebral angiogram confirms acute left ICA dissection as the source of the intracranial emboli, with cervical ICA occluded at the case conclusion. Signed, Dulcy Fanny. Dellia Nims, RPVI [facility] Radiology PLAN: Given the presence of hemorrhage, the patient will stay intubated. Straight to CT for baseline CT scan. Right  common femoral artery has been closed with Angio-Seal. Local wound care and right hip straight for 4 hours. The patient will be admitted to the neuro ICU. Target blood pressure goal of less than 412 systolic Electronically Signed   By: Corrie Mckusick D.O.   On: 02/20/2018 14:20   Ct Head Code Stroke Wo Contrast  Result Date: 02/19/2018 CLINICAL DATA:  Code stroke.  Right-sided weakness, aphasia EXAM: CT HEAD WITHOUT CONTRAST TECHNIQUE: Contiguous axial images were obtained from the base of the skull through the vertex without intravenous contrast. COMPARISON:  None. FINDINGS:  Brain: Hypodensity left occipital parietal cortex and white matter compatible with acute infarct. Patchy hypodensity in the left frontal lobe also consistent with infarct which is probably acute. Negative for hemorrhage.  Ventricle size normal.  No mass lesion. Vascular: Negative for hyperdense vessel Skull: Negative Sinuses/Orbits: Mild mucosal edema paranasal sinuses.  Normal orbit Other: None ASPECTS (Erwin Stroke Program Early CT Score) - Ganglionic level infarction (caudate, lentiform nuclei, internal capsule, insula, M1-M3 cortex): 7 - Supraganglionic infarction (M4-M6 cortex): 2 Total score (0-10 with 10 being normal): 8 IMPRESSION: 1. Hypodensities left frontal lobe and left occipital parietal lobe most compatible with acute infarct. Possible emboli or watershed infarct. Negative for hemorrhage or mass-effect 2. ASPECTS is 8 3. These results were called by telephone at the time of interpretation on 02/19/2018 at 12:29 pm to Dr. Leonel Ramsay , who verbally acknowledged these results. Electronically Signed   By: Franchot Gallo M.D.   On: 02/19/2018 12:31   Ir Angio Intra Extracran Sel Com Carotid Innominate Uni R Mod Sed  Result Date: 02/20/2018 INDICATION: 37 year old male with a history of acute left MCA stroke Given the patient's age and baseline function, thrombectomy was offered to the patient's family as a possibility of  improving his overall function and giving him the best chance of recovery, accepting a high risk of intracranial hemorrhage/malignant edema given the size of the infarction. EXAM: ULTRASOUND GUIDED ACCESS RIGHT COMMON FEMORAL ARTERY CEREBRAL ANGIOGRAM MECHANICAL THROMBECTOMY LEFT MCA CLOSURE OF ACCESS SITE WITH ANGIO-SEAL DEVICE COMPARISON:  CT 02/19/2018, CT ANGIOGRAM 02/19/2018 MEDICATIONS: 2 g Ancef. The antibiotic was administered within 1 hour of the procedure ANESTHESIA/SEDATION: GENERAL ENDOTRACHEAL TUBE ANESTHESIA WITH THE ANESTHESIA TEAM CONTRAST:  140 cc Isovue FLUOROSCOPY TIME:  Fluoroscopy Time: 35 minutes 6 seconds (2373 mGy). COMPLICATIONS: None TECHNIQUE: Informed written consent was obtained from the patient's family after a thorough discussion of the procedural risks, benefits and alternatives. Specific risks discussed include: Bleeding, infection, contrast reaction, kidney injury/failure, need for further procedure/surgery, arterial injury or dissection, embolization to new territory, intracranial hemorrhage (10-15% risk), neurologic deterioration, cardiopulmonary collapse, death. All questions were addressed. Maximal Sterile Barrier Technique was utilized including during the procedure including caps, mask, sterile gowns, sterile gloves, sterile drape, hand hygiene and skin antiseptic. A timeout was performed prior to the initiation of the procedure. The anesthesia team was present to provide general endotracheal tube anesthesia and for patient monitoring during the procedure. Interventional neuro radiology nursing staff was also present. FINDINGS: Ultrasound images were performed of the right inguinal region, confirming patency of the right common femoral artery. Images were stored and sent to PACs. Initial Findings: Left common carotid artery:  Normal course caliber and contour. Left external carotid artery: Patent with antegrade flow. Left internal carotid artery: There is irregular appearance  of the posterior proximal internal carotid artery extending from the carotid bulb along the proximal 3rd of the right internal carotid artery. This results in at least 50 percent narrowing, with irregular blunted appearance on the distal filling defect, compatible with thrombus/dissection. The mid and distal cervical segment of the ICA is patent. Unremarkable petrous segment, lacerum segment, cavernous segment, clinoid segment/ophthalmic segment and terminal segment. Left MCA: MCA occluded at the origin with a reversed meniscus suggesting acute thrombus. Patent anterior choroidal artery and a small posterior communicating artery, which washes out from collateral flow from the posterior. The initial injection demonstrates patent meningo-hypophyseal branch, with sellar blush. Left ACA: A 1 segment patent. A 2 segment perfuses the left territory. Patent anterior communicating  artery with transient perfusion of the right ACA territory. Completion Findings: Left MCA: After mechanical thrombectomy, there is reperfusion of the middle cerebral artery, with slow flow through the insular branches, TICI 2b. Early venous drainage is visualized. Upon withdrawal into the common carotid artery, there is occlusion of the proximal cervical ICA secondary to acute dissection/thrombus. External carotid artery remains patent, with early perfusion changes of external to internal collateralization, as there is filling of the carotid siphon via ascending cervical. Right common carotid artery:  Normal course caliber and contour. Right external carotid artery: Patent with antegrade flow. Right internal carotid artery: Normal course caliber and contour of the cervical portion. Vertical and petrous segment patent with normal course caliber contour. Lacerum segment patent. cavernous segment patent. Clinoid segment patent. Antegrade flow of the ophthalmic artery. Ophthalmic segment patent. Terminus patent. Crossover flow into the left  hemisphere, with perfusion of both the ACA territory and the MCA territory at the conclusion. Right MCA: M1 segment patent. Insular and opercular segments patent. Unremarkable caliber and course of the cortical segments. Typical arterial, capillary/ parenchymal, and venous phase. Right ACA: A 1 segment patent. A 2 segment perfuses the right territory. Right vertebral: Cervical vertebral artery have normal course caliber and contour with expected filling of the C3, C2, C1 branches. The dominant runoff of the right vertebral artery is into a right posterior inferior cerebellar artery, with a small right vertebral artery to basilar contribution. The majority of flow into the basilar from the right is washed out from a left vertebral artery inflow. Left vertebral: Normal course caliber and contour of left vertebral artery. Dominant left vertebral artery. Expected filling of the C3, C2, C1 branches. Dominant vertebral artery contributes to basilar artery, with patent left posterior inferior cerebellar artery, patent bilateral superior cerebellar artery, and patent bilateral posterior cerebral artery, with symmetric opacification from the left vertebral injection. The left posterior cerebral artery contributes to perfusion of the left temporal lobe and occipital lobe. Flat panel CT: CT demonstrates no significant mass effect, however, at the conclusion there is evidence of contrast staining of the left MCA territory gyri as well as a combination of ICH/SAH at the left basal ganglia and sylvian fissure. Evidence of involving left MCA infarction. PROCEDURE: Patient is brought emergently to the neuro angiography suite, with the patient identified appropriately and placed supine position on the table. Left radial arterial line was placed by the anesthesia team. The patient is then prepped and draped in the usual sterile fashion. Ultrasound survey of the right inguinal region was performed with images stored and sent to PACs,  confirming patency of the common femoral artery. 11 blade scalpel was used to make a small incision. Blunt dissection was performed. A micropuncture needle was used access the right common femoral artery under ultrasound. With excellent arterial blood flow returned, an .018 micro wire was passed through the needle, observed to enter the abdominal aorta under fluoroscopy. The needle was removed, and a micropuncture sheath was placed over the wire. The inner dilator and wire were removed, and an 035 Bentson wire was advanced under fluoroscopy into the abdominal aorta. The sheath was removed and a standard 5 [place] vascular sheath was placed. The dilator was removed and the sheath was flushed. A 49F JB-1 diagnostic catheter was advanced over the wire to the proximal descending thoracic aorta. Wire was then removed. Double flush of the catheter was performed. Catheter was then used to select the left common carotid artery. Formal angiogram was performed of  the cervical and cerebral vessels. Glidewire was then used to navigate across the dissected proximal cervical ICA. Diagnostic catheter was then advanced into the distal cervical ICA. Exchange length Rosen wire was then passed through the diagnostic catheter to the distal cervical ICA and the diagnostic catheter was removed. The 5 French sheath was removed and exchanged for 8 French 55 centimeter BrightTip sheath. Sheath was flushed and attached to pressurized and heparinized saline bag for constant forward flow. Then a neuron Max 80cm catheter was prepared on the back table. Ace 68 intermediate catheter was then loaded though the Neuron Max catheter with coaxial microcatheter, and advanced through the guide catheter. Under roadmap angiogram, Microcatheter system was then introduced through the Ace catheter using a synchro soft 014 wire and a Trevo Provue18 catheter. Microcatheter system was advanced into the internal carotid artery, to the level of the occlusion. The  micro wire and the microcatheter were carefully placed at the proximal occlusion, and the aspiration catheter was advanced to the occlusion. Microwire in the microcatheter were withdrawn, an aspiration was performed. Aspiration was initiated, confirming return of blood flow. The catheter was then advanced into the occlusion with stasis of blood flow returned. With no blood flow returned after 30 seconds-60 seconds of aspiration, the aspiration catheter was removed entirely from the neuron max. This catheter was then flushed, with clot retrieved. Repeat angiogram performed. Further targets with thrombus were identified within M2 branches. The aspiration catheter was then used as a intermediate catheter, with the microcatheter advanced with the synchro wire. Under road map angiogram, the microcatheter and microwire were advanced beyond the M2 thrombus. The wire was removed and gentle aspiration was performed confirming blood flow returned. Gentle injection was performed to prove intraluminal location. A rotating hemostatic valve was then attached to the back end of the microcatheter, and a pressurized and heparinized saline bag was attached to the catheter. 4 x 40 solitaire device was then selected. Back flush was achieved at the rotating hemostatic valve, and then the device was gently advanced through the microcatheter to the distal end. The retriever was then unsheathed by withdrawing the microcatheter under fluoroscopy. Once the retriever was completely unsheathed, control angiogram was performed from the balloon catheter. Constant aspiration was then performed through the Ace catheter as the retriever was gently and slowly withdrawn with fluoroscopic observation. Once the retriever was entirely removed from the system, free aspiration was confirmed at the hub of the intermediate catheter, with free blood return confirmed. Control angiogram was then performed. Persistent occlusion at the proximal M2 branch was  confirmed. The microcatheter system was then again advanced through the Ace catheter. Once the micro wire microcatheter were beyond the M2 occlusion, the micro wire was removed and solitaire 4 x 40 device was deployed. After the solitaire was deployed across the occlusion, the Ace catheter was advanced into the M1 segment at the site of the occlusion. Local aspiration was performed upon withdrawal of the solitaire device under fluoroscopic observation. Once the retrieve her was entirely removed from the system, free aspiration was confirmed at the hub of the intermediate catheter, with free blood return confirmed. Ace aspiration catheter was removed. Control angiogram was again performed. 035 rose in wire was then advanced through the neuron max into the cervical segment. The neuron max was withdrawn proximal to the bifurcation with repeat angiogram performed. At this point, the results and evolution of the case as well as the patient management was thoroughly discussed with the neurology team. We elected  at this time to perform flat panel CT. After the results demonstrated presence of subarachnoid/ICH, we elected not to place a carotid stent. The neuro on max was removed. Completion cerebral angiogram was then performed of the right carotid system and the bilateral vertebral arteries. Catheter was removed, and the bright tip sheath was exchanged for a short 8 French sheath at the right common femoral artery. Eight [place] Angio-Seal was deployed for hemostasis. Patient tolerated the procedure well and remained hemodynamically stable throughout. No complications were encountered. IMPRESSION: Status post ultrasound guided access right common femoral artery for cerebral angiogram and mechanical thrombectomy of left MCA ELVO, achieving TICI 2b reperfusion after 3 passes with a combination of ADAPT technique and SOLUMBRA technique using a 4x40 solitaire. Status post deployment of Angio-Seal for hemostasis at the access  site. Cerebral angiogram confirms acute left ICA dissection as the source of the intracranial emboli, with cervical ICA occluded at the case conclusion. Signed, Dulcy Fanny. Dellia Nims, RPVI [facility] Radiology PLAN: Given the presence of hemorrhage, the patient will stay intubated. Straight to CT for baseline CT scan. Right common femoral artery has been closed with Angio-Seal. Local wound care and right hip straight for 4 hours. The patient will be admitted to the neuro ICU. Target blood pressure goal of less than 062 systolic Electronically Signed   By: Corrie Mckusick D.O.   On: 02/20/2018 14:20   Ir Angio Vertebral Sel Vertebral Bilat Mod Sed  Result Date: 02/20/2018 INDICATION: 37 year old male with a history of acute left MCA stroke Given the patient's age and baseline function, thrombectomy was offered to the patient's family as a possibility of improving his overall function and giving him the best chance of recovery, accepting a high risk of intracranial hemorrhage/malignant edema given the size of the infarction. EXAM: ULTRASOUND GUIDED ACCESS RIGHT COMMON FEMORAL ARTERY CEREBRAL ANGIOGRAM MECHANICAL THROMBECTOMY LEFT MCA CLOSURE OF ACCESS SITE WITH ANGIO-SEAL DEVICE COMPARISON:  CT 02/19/2018, CT ANGIOGRAM 02/19/2018 MEDICATIONS: 2 g Ancef. The antibiotic was administered within 1 hour of the procedure ANESTHESIA/SEDATION: GENERAL ENDOTRACHEAL TUBE ANESTHESIA WITH THE ANESTHESIA TEAM CONTRAST:  140 cc Isovue FLUOROSCOPY TIME:  Fluoroscopy Time: 35 minutes 6 seconds (2373 mGy). COMPLICATIONS: None TECHNIQUE: Informed written consent was obtained from the patient's family after a thorough discussion of the procedural risks, benefits and alternatives. Specific risks discussed include: Bleeding, infection, contrast reaction, kidney injury/failure, need for further procedure/surgery, arterial injury or dissection, embolization to new territory, intracranial  hemorrhage (10-15% risk), neurologic deterioration, cardiopulmonary collapse, death. All questions were addressed. Maximal Sterile Barrier Technique was utilized including during the procedure including caps, mask, sterile gowns, sterile gloves, sterile drape, hand hygiene and skin antiseptic. A timeout was performed prior to the initiation of the procedure. The anesthesia team was present to provide general endotracheal tube anesthesia and for patient monitoring during the procedure. Interventional neuro radiology nursing staff was also present. FINDINGS: Ultrasound images were performed of the right inguinal region, confirming patency of the right common femoral artery. Images were stored and sent to PACs. Initial Findings: Left common carotid artery:  Normal course caliber and contour. Left external carotid artery: Patent with antegrade flow. Left internal carotid artery: There is irregular appearance of the posterior proximal internal carotid artery extending from the carotid bulb along the proximal 3rd of the right internal carotid artery. This results in at least 50 percent narrowing, with irregular blunted appearance on the distal filling defect, compatible with thrombus/dissection. The mid and distal cervical  segment of the ICA is patent. Unremarkable petrous segment, lacerum segment, cavernous segment, clinoid segment/ophthalmic segment and terminal segment. Left MCA: MCA occluded at the origin with a reversed meniscus suggesting acute thrombus. Patent anterior choroidal artery and a small posterior communicating artery, which washes out from collateral flow from the posterior. The initial injection demonstrates patent meningo-hypophyseal branch, with sellar blush. Left ACA: A 1 segment patent. A 2 segment perfuses the left territory. Patent anterior communicating artery with transient perfusion of the right ACA territory. Completion Findings: Left MCA: After mechanical thrombectomy, there is reperfusion of  the middle cerebral artery, with slow flow through the insular branches, TICI 2b. Early venous drainage is visualized. Upon withdrawal into the common carotid artery, there is occlusion of the proximal cervical ICA secondary to acute dissection/thrombus. External carotid artery remains patent, with early perfusion changes of external to internal collateralization, as there is filling of the carotid siphon via ascending cervical. Right common carotid artery:  Normal course caliber and contour. Right external carotid artery: Patent with antegrade flow. Right internal carotid artery: Normal course caliber and contour of the cervical portion. Vertical and petrous segment patent with normal course caliber contour. Lacerum segment patent. cavernous segment patent. Clinoid segment patent. Antegrade flow of the ophthalmic artery. Ophthalmic segment patent. Terminus patent. Crossover flow into the left hemisphere, with perfusion of both the ACA territory and the MCA territory at the conclusion. Right MCA: M1 segment patent. Insular and opercular segments patent. Unremarkable caliber and course of the cortical segments. Typical arterial, capillary/ parenchymal, and venous phase. Right ACA: A 1 segment patent. A 2 segment perfuses the right territory. Right vertebral: Cervical vertebral artery have normal course caliber and contour with expected filling of the C3, C2, C1 branches. The dominant runoff of the right vertebral artery is into a right posterior inferior cerebellar artery, with a small right vertebral artery to basilar contribution. The majority of flow into the basilar from the right is washed out from a left vertebral artery inflow. Left vertebral: Normal course caliber and contour of left vertebral artery. Dominant left vertebral artery. Expected filling of the C3, C2, C1 branches. Dominant vertebral artery contributes to basilar artery, with patent left posterior inferior cerebellar artery, patent bilateral  superior cerebellar artery, and patent bilateral posterior cerebral artery, with symmetric opacification from the left vertebral injection. The left posterior cerebral artery contributes to perfusion of the left temporal lobe and occipital lobe. Flat panel CT: CT demonstrates no significant mass effect, however, at the conclusion there is evidence of contrast staining of the left MCA territory gyri as well as a combination of ICH/SAH at the left basal ganglia and sylvian fissure. Evidence of involving left MCA infarction. PROCEDURE: Patient is brought emergently to the neuro angiography suite, with the patient identified appropriately and placed supine position on the table. Left radial arterial line was placed by the anesthesia team. The patient is then prepped and draped in the usual sterile fashion. Ultrasound survey of the right inguinal region was performed with images stored and sent to PACs, confirming patency of the common femoral artery. 11 blade scalpel was used to make a small incision. Blunt dissection was performed. A micropuncture needle was used access the right common femoral artery under ultrasound. With excellent arterial blood flow returned, an .018 micro wire was passed through the needle, observed to enter the abdominal aorta under fluoroscopy. The needle was removed, and a micropuncture sheath was placed over the wire. The inner dilator and wire were removed,  and an 035 Bentson wire was advanced under fluoroscopy into the abdominal aorta. The sheath was removed and a standard 5 [place] vascular sheath was placed. The dilator was removed and the sheath was flushed. A 50F JB-1 diagnostic catheter was advanced over the wire to the proximal descending thoracic aorta. Wire was then removed. Double flush of the catheter was performed. Catheter was then used to select the left common carotid artery. Formal angiogram was performed of the cervical and cerebral vessels. Glidewire was then used to navigate  across the dissected proximal cervical ICA. Diagnostic catheter was then advanced into the distal cervical ICA. Exchange length Rosen wire was then passed through the diagnostic catheter to the distal cervical ICA and the diagnostic catheter was removed. The 5 French sheath was removed and exchanged for 8 French 55 centimeter BrightTip sheath. Sheath was flushed and attached to pressurized and heparinized saline bag for constant forward flow. Then a neuron Max 80cm catheter was prepared on the back table. Ace 68 intermediate catheter was then loaded though the Neuron Max catheter with coaxial microcatheter, and advanced through the guide catheter. Under roadmap angiogram, Microcatheter system was then introduced through the Ace catheter using a synchro soft 014 wire and a Trevo Provue18 catheter. Microcatheter system was advanced into the internal carotid artery, to the level of the occlusion. The micro wire and the microcatheter were carefully placed at the proximal occlusion, and the aspiration catheter was advanced to the occlusion. Microwire in the microcatheter were withdrawn, an aspiration was performed. Aspiration was initiated, confirming return of blood flow. The catheter was then advanced into the occlusion with stasis of blood flow returned. With no blood flow returned after 30 seconds-60 seconds of aspiration, the aspiration catheter was removed entirely from the neuron max. This catheter was then flushed, with clot retrieved. Repeat angiogram performed. Further targets with thrombus were identified within M2 branches. The aspiration catheter was then used as a intermediate catheter, with the microcatheter advanced with the synchro wire. Under road map angiogram, the microcatheter and microwire were advanced beyond the M2 thrombus. The wire was removed and gentle aspiration was performed confirming blood flow returned. Gentle injection was performed to prove intraluminal location. A rotating hemostatic  valve was then attached to the back end of the microcatheter, and a pressurized and heparinized saline bag was attached to the catheter. 4 x 40 solitaire device was then selected. Back flush was achieved at the rotating hemostatic valve, and then the device was gently advanced through the microcatheter to the distal end. The retriever was then unsheathed by withdrawing the microcatheter under fluoroscopy. Once the retriever was completely unsheathed, control angiogram was performed from the balloon catheter. Constant aspiration was then performed through the Ace catheter as the retriever was gently and slowly withdrawn with fluoroscopic observation. Once the retriever was entirely removed from the system, free aspiration was confirmed at the hub of the intermediate catheter, with free blood return confirmed. Control angiogram was then performed. Persistent occlusion at the proximal M2 branch was confirmed. The microcatheter system was then again advanced through the Ace catheter. Once the micro wire microcatheter were beyond the M2 occlusion, the micro wire was removed and solitaire 4 x 40 device was deployed. After the solitaire was deployed across the occlusion, the Ace catheter was advanced into the M1 segment at the site of the occlusion. Local aspiration was performed upon withdrawal of the solitaire device under fluoroscopic observation. Once the retrieve her was entirely removed from the system, free  aspiration was confirmed at the hub of the intermediate catheter, with free blood return confirmed. Ace aspiration catheter was removed. Control angiogram was again performed. 035 rose in wire was then advanced through the neuron max into the cervical segment. The neuron max was withdrawn proximal to the bifurcation with repeat angiogram performed. At this point, the results and evolution of the case as well as the patient management was thoroughly discussed with the neurology team. We elected at this time to  perform flat panel CT. After the results demonstrated presence of subarachnoid/ICH, we elected not to place a carotid stent. The neuro on max was removed. Completion cerebral angiogram was then performed of the right carotid system and the bilateral vertebral arteries. Catheter was removed, and the bright tip sheath was exchanged for a short 8 French sheath at the right common femoral artery. Eight [place] Angio-Seal was deployed for hemostasis. Patient tolerated the procedure well and remained hemodynamically stable throughout. No complications were encountered. IMPRESSION: Status post ultrasound guided access right common femoral artery for cerebral angiogram and mechanical thrombectomy of left MCA ELVO, achieving TICI 2b reperfusion after 3 passes with a combination of ADAPT technique and SOLUMBRA technique using a 4x40 solitaire. Status post deployment of Angio-Seal for hemostasis at the access site. Cerebral angiogram confirms acute left ICA dissection as the source of the intracranial emboli, with cervical ICA occluded at the case conclusion. Signed, Dulcy Fanny. Dellia Nims, RPVI [facility]iology [facility] Radiology PLAN: Given the presence of hemorrhage, the patient will stay intubated. Straight to CT for baseline CT scan. Right common femoral artery has been closed with Angio-Seal. Local wound care and right hip straight for 4 hours. The patient will be admitted to the neuro ICU. Target blood pressure goal of less than 497 systolic Electronically Signed   By: Corrie Mckusick D.O.   On: 02/20/2018 14:20    Assessment and Plan:   1.  Acute left MCA infarct with increased cerebral edema with 4 mm midline shift: -Patient presented to [facility] after being found at his home on the floor by his significant other on 02/19/2018 after several recent "falls". He has had recent complaints of upper extremity dysfunction (unable to write his name) and LE swelling. He also had complaints to his  father about a recent migraine. Code stroke was initiated. Given his head CT findings, patient was taken to IR for possible intervention. Per IR note, there was initial concern for reperfusion malignant edema/hemorrhage.  The decision was then made to proceed with mechanical thrombectomy on 02/19/2018 though this was complicated by intracranial hemorrhage and was deemed unsuccessful.  -Neurosurgery consulted on 02/21/2018 with recommendations for decompressive hemicraniotomy and duraplasty scheduled for today, 02/21/2018.  2.  Aortic valve vegetation/mass: -Echocardiogram performed 02/20/2018 with concern for AV vegetation versus aortic valve mass -Patient without endocarditis symptoms, afebrile, WBC 15.4>19.5>14.2 -Blood cultures sent per primary team -Will need TEE, however given patient's current status and scheduled surgery (02/21/18) will defer to a more appropriate time when okay with neurosurgery.  3.  Tobacco abuse: -Current every day smoker -Smoking cessation will be provided once extubated   For questions or updates, please contact [place] Please consult [email or web address] for contact info under Cardiology/STEMI.   Lyndel Safe NP-C [facility] Pager: [phone] 02/21/2018 2:17 PM   Patient examined chart reviewed. Discussed care with Dr Saintclair Halsted and PA. Patient getting ready to go for decompressive craniotomy. Exam with flaccid right side moves LUE. Intubated no aortic valve murmur lungs clear  anteriorly Reviewed his echo and does have what appears to be a vegetation on ventricular side of AV. Not causing any hemodynamic issues in regard to AR/AS. SBE may be related to soft tissue infection of lip weeks ago.  Rx with antibiotics for 6 weeks per ID. Reviewed head CT and has 4 mm of midline shift. No cardiac contraindications to surgery now. Will continue to have brain edema next 3-4 days with rise in intracranial pressure so hopefully procedure will help decrease the risk of  nuchal herniation. No immediate cardiac w/u indicated Will need TEE latter when stable as initial w/u   Jenkins Rouge

## 2018-02-21 NOTE — Op Note (Signed)
Pre-operative diagnosis: Increased intracranial pressure from large left MCA hemorrhagic infarction  Postoperative diagnosis: Same  Procedure: Left-sided decompressive hemicraniectomy and duraplasty with implantation of cranial flap in the abdominal wall in the left middle quadrant  Surgeon: Dominica Severin Alfonsa Vaile  Asst.: Glenford Peers  Anesthesia: Gen.  EBL: Minimal  History of present illness: A 37 year old gentleman who presented to the ER with acute onset of altered mental status and right hemiplegia. Workup revealed a large left MCA thrombotic infarction. Patient went from a lysis however it reoccluded. Follow-up imaging showed progressive and increased midline shift and increase her blood edema. So due to patient's age clinical status where he still was awake and following commands we recommended decompressive hemorrhagic craniectomy for assistance in management of intracranial increased pressure. We extensively with the risks and benefits of the operation with him as well as perioperative course expectations of outcome alternatives of surgery and the family understood and agreed to proceed forward.  Operative procedure: Patient brought into the or was induced on general anesthesia positioned supine with a shoulder bump under his left shoulder his head turned to the right the right side of his head was shaved prepped and draped in routine sterile fashion as well as and the left middle quadrant of his abdominal wall. After adequate prepping and time out 2 incisions were drawn out in inverted question mark and the cranial incision in a linear incision the abdominal wall then evident was prepped and draped infiltrated with 10 mL lidocaine with epi in each incision. And then simultaneously reopened the abdominal wall and the scalp incision and turned a large left frontoparietal craniotomy flap incised the dura in a cruciate fashion the brain was visible and was pulsatile however was under increased pressure.  Then I selected 2 large 3 x 3 and 2 x 2 dural matrix and anchored in place to create a dural substitute pouch and pocket. Then reapproximated temporalis and then the scalp with 2-0 interrupted Vicryl staples in the skin of the scalp incision the cranial flap was then imprinted into the abdominal wall incision this was closed with interrupted Vicryls and an interrupted subcuticular stitch. Both wounds were dressed patient back to the ICU in stable condition. At the end the case on the account sponge counts were correct.

## 2018-02-22 ENCOUNTER — Encounter (HOSPITAL_COMMUNITY): Payer: Self-pay | Admitting: Neurosurgery

## 2018-02-22 DIAGNOSIS — I639 Cerebral infarction, unspecified: Secondary | ICD-10-CM

## 2018-02-22 DIAGNOSIS — I69391 Dysphagia following cerebral infarction: Secondary | ICD-10-CM

## 2018-02-22 DIAGNOSIS — I358 Other nonrheumatic aortic valve disorders: Secondary | ICD-10-CM

## 2018-02-22 DIAGNOSIS — F17200 Nicotine dependence, unspecified, uncomplicated: Secondary | ICD-10-CM

## 2018-02-22 DIAGNOSIS — G936 Cerebral edema: Secondary | ICD-10-CM

## 2018-02-22 DIAGNOSIS — Z789 Other specified health status: Secondary | ICD-10-CM

## 2018-02-22 LAB — GLUCOSE, CAPILLARY
GLUCOSE-CAPILLARY: 120 mg/dL — AB (ref 70–99)
GLUCOSE-CAPILLARY: 104 mg/dL — AB (ref 70–99)
Glucose-Capillary: 131 mg/dL — ABNORMAL HIGH (ref 70–99)
Glucose-Capillary: 132 mg/dL — ABNORMAL HIGH (ref 70–99)

## 2018-02-22 LAB — CBC WITH DIFFERENTIAL/PLATELET
Basophils Absolute: 0 10*3/uL (ref 0.0–0.1)
Basophils Relative: 0 %
EOS ABS: 0 10*3/uL (ref 0.0–0.7)
Eosinophils Relative: 0 %
HCT: 42 % (ref 39.0–52.0)
Hemoglobin: 14.5 g/dL (ref 13.0–17.0)
LYMPHS ABS: 1.5 10*3/uL (ref 0.7–4.0)
Lymphocytes Relative: 8 %
MCH: 38.7 pg — AB (ref 26.0–34.0)
MCHC: 34.5 g/dL (ref 30.0–36.0)
MCV: 112 fL — ABNORMAL HIGH (ref 78.0–100.0)
MONO ABS: 1.6 10*3/uL — AB (ref 0.1–1.0)
Monocytes Relative: 9 %
NEUTROS PCT: 83 %
Neutro Abs: 15.2 10*3/uL — ABNORMAL HIGH (ref 1.7–7.7)
PLATELETS: 197 10*3/uL (ref 150–400)
RBC: 3.75 MIL/uL — AB (ref 4.22–5.81)
RDW: 13.7 % (ref 11.5–15.5)
WBC: 18.3 10*3/uL — AB (ref 4.0–10.5)

## 2018-02-22 LAB — BETA-2-GLYCOPROTEIN I ABS, IGG/M/A: Beta-2 Glyco I IgG: <9 GPI IgG units (ref 0–20)

## 2018-02-22 LAB — HIV ANTIBODY (ROUTINE TESTING W REFLEX): HIV SCREEN 4TH GENERATION: NONREACTIVE

## 2018-02-22 LAB — MAGNESIUM: MAGNESIUM: 2 mg/dL (ref 1.7–2.4)

## 2018-02-22 LAB — SODIUM
SODIUM: 157 mmol/L — AB (ref 135–145)
Sodium: 156 mmol/L — ABNORMAL HIGH (ref 135–145)
Sodium: 158 mmol/L — ABNORMAL HIGH (ref 135–145)

## 2018-02-22 LAB — TRIGLYCERIDES: Triglycerides: 94 mg/dL (ref <150)

## 2018-02-22 LAB — CARDIOLIPIN ANTIBODIES, IGG, IGM, IGA: Anticardiolipin IgM: 11 MPL U/mL (ref 0–12)

## 2018-02-22 LAB — BASIC METABOLIC PANEL
Anion gap: 9 (ref 5–15)
BUN: 5 mg/dL — AB (ref 6–20)
CALCIUM: 8.5 mg/dL — AB (ref 8.9–10.3)
CO2: 27 mmol/L (ref 22–32)
CREATININE: 0.65 mg/dL (ref 0.61–1.24)
Chloride: 121 mmol/L — ABNORMAL HIGH (ref 98–111)
GFR calc non Af Amer: >60 mL/min (ref >60)
Glucose, Bld: 127 mg/dL — ABNORMAL HIGH (ref 70–99)
Potassium: 3.2 mmol/L — ABNORMAL LOW (ref 3.5–5.1)
SODIUM: 157 mmol/L — AB (ref 135–145)

## 2018-02-22 LAB — PROTEIN C, TOTAL: Protein C, Total: 88 % (ref 60–150)

## 2018-02-22 MED ORDER — ORAL CARE MOUTH RINSE
15.0000 mL | Freq: Two times a day (BID) | OROMUCOSAL | Status: DC
  Administered 2018-02-22: 15 mL via OROMUCOSAL

## 2018-02-22 MED ORDER — CHLORHEXIDINE GLUCONATE CLOTH 2 % EX PADS
6.0000 | MEDICATED_PAD | Freq: Every day | CUTANEOUS | Status: AC
  Administered 2018-02-23 – 2018-02-24 (×2): 6 via TOPICAL

## 2018-02-22 MED ORDER — POTASSIUM CHLORIDE 10 MEQ/50ML IV SOLN
10.0000 meq | INTRAVENOUS | Status: AC
  Administered 2018-02-22 (×3): 10 meq via INTRAVENOUS
  Filled 2018-02-22 (×3): qty 50

## 2018-02-22 MED ORDER — CHLORHEXIDINE GLUCONATE 0.12 % MT SOLN
15.0000 mL | Freq: Two times a day (BID) | OROMUCOSAL | Status: DC
  Administered 2018-02-22 – 2018-03-01 (×14): 15 mL via OROMUCOSAL
  Filled 2018-02-22 (×10): qty 15

## 2018-02-22 MED ORDER — ORAL CARE MOUTH RINSE
15.0000 mL | Freq: Two times a day (BID) | OROMUCOSAL | Status: DC
  Administered 2018-02-25 – 2018-02-28 (×8): 15 mL via OROMUCOSAL

## 2018-02-22 MED FILL — Thrombin (Recombinant) For Soln 20000 Unit: CUTANEOUS | Qty: 1 | Status: AC

## 2018-02-22 NOTE — Progress Notes (Signed)
CCMD and Neurology MD notified of patient self extubating at [place]. Patient vital signs stable and continues to follow commands. RN will continue to monitor.
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
## 2018-02-22 NOTE — Plan of Care (Signed)
Patient will have a FEES or MBS tomorrow to determine any aspiration.  Patient can have meds crushed in applesauce until the study.  Will continue to monitor.  Katherine Mantle RN

## 2018-02-22 NOTE — Progress Notes (Signed)
Rehab Admissions Coordinator Note:  Per OT and SLP recommendation, patient was screened by Jhonnie Garner for appropriateness for an Inpatient Acute Rehab Consult.  At this time, we are recommending Inpatient Rehab consult. AC will contact MD for order. Please call if questions.   Jhonnie Garner 02/22/2018, 12:03 PM  I can be reached at [phone].

## 2018-02-22 NOTE — Progress Notes (Signed)
Subjective:  Intubated   Objective:  Vitals:   02/22/18 0615 02/22/18 0630 02/22/18 0645 02/22/18 0700  BP: 139/77 138/83 138/75 129/77  Pulse: 75 75 87 65  Resp: 16 20 17 16   Temp:      TempSrc:      SpO2: 98% 99% 100% 99%  Weight:      Height:        Intake/Output from previous day:  Intake/Output Summary (Last 24 hours) at 02/22/2018 0815 Last data filed at 02/22/2018 0700 Gross per 24 hour  Intake 2243.37 ml  Output 3900 ml  Net -1656.63 ml    Physical Exam: Intubated Young white male  Post craniotomy  Neck supple with no adenopathy JVP normal no bruits no thyromegaly Lungs clear with no wheezing and good diaphragmatic motion Heart:  S1/S2 no murmur, no rub, gallop or click PMI normal Abdomen: benighn, BS positve, no tenderness, no AAA no bruit.  No HSM or HJR Distal pulses intact with no bruits No edema Right hemiparesis  Skin warm and dry   Lab Results: Basic Metabolic Panel: Recent Labs    02/21/18 0539  02/21/18 1702 02/21/18 2315 02/22/18 0516  NA 150*   < > 157* 156* 157*  K 3.4*  --   --   --  3.2*  CL 118*  --   --   --  121*  CO2 25  --   --   --  27  GLUCOSE 120*  --   --   --  127*  BUN 12  --   --   --  5*  CREATININE 0.70  --   --   --  0.65  CALCIUM 8.0*  --   --   --  8.5*  MG 1.8  --  1.8  --  2.0  PHOS 2.6  --  2.9  --   --    < > = values in this interval not displayed.   Liver Function Tests: Recent Labs    02/19/18 1215  AST 28  ALT 33  ALKPHOS 96  BILITOT 1.1  PROT 7.2  ALBUMIN 3.4*   No results for input(s): LIPASE, AMYLASE in the last 72 hours. CBC: Recent Labs    02/20/18 0547 02/21/18 0539 02/22/18 0516  WBC 19.5* 14.2* 18.3*  NEUTROABS 17.4*  --  15.2*  HGB 15.2 15.1 14.5  HCT 43.0 44.2 42.0  MCV 109.1* 112.5* 112.0*  PLT 174 157 197   Hemoglobin A1C: Recent Labs    02/20/18 0547  HGBA1C 4.9   Fasting Lipid Panel: Recent Labs    02/20/18 0547  CHOL 101  HDL 34*  LDLCALC 46  TRIG 103    CHOLHDL 3.0   Thyroid Function Tests: Recent Labs    02/20/18 1127  TSH 1.000   Anemia Panel: Recent Labs    02/19/18 1655  VITAMINB12 224  FOLATE 1.5*    Imaging: Ct Head Wo Contrast  Result Date: 02/21/2018 CLINICAL DATA:  Stroke follow-up EXAM: CT HEAD WITHOUT CONTRAST TECHNIQUE: Contiguous axial images were obtained from the base of the skull through the vertex without intravenous contrast. COMPARISON:  Head CT 02/19/2018 FINDINGS: Brain: There is increased hypodensity throughout the left MCA distribution in the location of the known infarct. Hyperdense material within the infarcted region has also decreased in density, with no increased volume. Mass effect on the left lateral ventricle is increased and there is now rightward midline shift measuring 4 mm at the level  of the foramina of [place]. No hydrocephalus. Right hemisphere and cerebellum are normal. Vascular: Hyperattenuation of the left middle cerebral artery. Skull: The visualized skull base, calvarium and extracranial soft tissues are normal. Sinuses/Orbits: No fluid levels or advanced mucosal thickening of the visualized paranasal sinuses. No mastoid or middle ear effusion. The orbits are normal. IMPRESSION: 1. Progression of cytotoxic edema throughout the left MCA distribution, causing slight mass effect on the left lateral ventricle with 4 mm of rightward midline shift. No hydrocephalus. 2. Decreased density in unchanged distribution of hyperattenuating material in the left MCA territory, favored to indicate resolving contrast staining. Electronically Signed   By: Ulyses Jarred M.D.   On: 02/21/2018 05:15   Mr Jodene Nam Head Wo Contrast  Result Date: 02/20/2018 CLINICAL DATA:  Stroke post left MCA clot retrieval. Left carotid dissection EXAM: MRI HEAD WITHOUT CONTRAST MRA HEAD WITHOUT CONTRAST TECHNIQUE: Multiplanar, multiecho pulse sequences of the brain and surrounding structures were obtained without intravenous contrast.  Angiographic images of the head were obtained using MRA technique without contrast. COMPARISON:  CT head 02/19/2018 FINDINGS: MRI HEAD FINDINGS Brain: Large territory acute infarct left MCA territory. Acute infarct throughout the left basal ganglia and entire left MCA territory. Mild amount of hemorrhage in the left frontal and parietal lobe. Diffuse edema with mass-effect and 2 mm midline shift to the right. No other areas of infarct hemorrhage or mass. Vascular: Thrombosed left internal carotid artery. Normal flow void right internal carotid artery and posterior circulation Skull and upper cervical spine: Negative Sinuses/Orbits: Negative Other: None MRA HEAD FINDINGS Left internal carotid artery is occluded through the cervical and cavernous segment. Left middle cerebral artery is occluded including the left M1 M2 and M3 segments. Left anterior cerebral artery is patent presumably supplied from the right. Right internal carotid artery widely patent. Right anterior and middle cerebral arteries normal. Both vertebral arteries patent to the basilar. PICA patent bilaterally. Basilar widely patent. Superior cerebellar and posterior cerebral arteries normal bilaterally Negative for cerebral aneurysm IMPRESSION: Acute infarct of the entire left MCA territory with mild associated hemorrhage in the left parietal and frontal lobe. Diffuse edema. 2 mm midline shift to the right. No other infarct Occlusion of the left internal carotid artery and entire left middle cerebral artery Electronically Signed   By: Franchot Gallo M.D.   On: 02/20/2018 10:52   Mr Brain Wo Contrast  Result Date: 02/20/2018 CLINICAL DATA:  Stroke post left MCA clot retrieval. Left carotid dissection EXAM: MRI HEAD WITHOUT CONTRAST MRA HEAD WITHOUT CONTRAST TECHNIQUE: Multiplanar, multiecho pulse sequences of the brain and surrounding structures were obtained without intravenous contrast. Angiographic images of the head were obtained using MRA  technique without contrast. COMPARISON:  CT head 02/19/2018 FINDINGS: MRI HEAD FINDINGS Brain: Large territory acute infarct left MCA territory. Acute infarct throughout the left basal ganglia and entire left MCA territory. Mild amount of hemorrhage in the left frontal and parietal lobe. Diffuse edema with mass-effect and 2 mm midline shift to the right. No other areas of infarct hemorrhage or mass. Vascular: Thrombosed left internal carotid artery. Normal flow void right internal carotid artery and posterior circulation Skull and upper cervical spine: Negative Sinuses/Orbits: Negative Other: None MRA HEAD FINDINGS Left internal carotid artery is occluded through the cervical and cavernous segment. Left middle cerebral artery is occluded including the left M1 M2 and M3 segments. Left anterior cerebral artery is patent presumably supplied from the right. Right internal carotid artery widely patent. Right anterior and middle cerebral  arteries normal. Both vertebral arteries patent to the basilar. PICA patent bilaterally. Basilar widely patent. Superior cerebellar and posterior cerebral arteries normal bilaterally Negative for cerebral aneurysm IMPRESSION: Acute infarct of the entire left MCA territory with mild associated hemorrhage in the left parietal and frontal lobe. Diffuse edema. 2 mm midline shift to the right. No other infarct Occlusion of the left internal carotid artery and entire left middle cerebral artery Electronically Signed   By: Franchot Gallo M.D.   On: 02/20/2018 10:52   Dg Chest Port 1 View  Result Date: 02/21/2018 CLINICAL DATA:  Hypoxia EXAM: PORTABLE CHEST 1 VIEW COMPARISON:  February 20, 2018 FINDINGS: Endotracheal tube tip is 4.0 cm above the carina. Nasogastric tube tip and side port are below the diaphragm. Central catheter tip is at the cavoatrial junction. No pneumothorax. There is atelectatic change in the lung bases. Lungs elsewhere clear. Heart is upper normal in size with pulmonary  vascularity normal. No adenopathy. No bone lesions. IMPRESSION: Tube and catheter positions as described without pneumothorax. Lower lobe atelectatic change. No edema or consolidation. Stable cardiac silhouette. Electronically Signed   By: Lowella Grip III M.D.   On: 02/21/2018 07:25   Dg Chest Port 1 View  Result Date: 02/20/2018 CLINICAL DATA:  Respiratory failure,central line placement EXAM: PORTABLE CHEST 1 VIEW COMPARISON:  02/20/2018 FINDINGS: Endotracheal tube is in place, tip approximately 7.1 centimeters above the carina. Nasogastric tube is in place, tip beyond the level of the mid stomach. A LEFT IJ central line tip overlies the superior vena cava and is new since the previous exam. There is no pneumothorax. Heart size is normal. Mild subsegmental atelectasis in the MEDIAL LEFT lung base. Remote RIGHT rib fracture. IMPRESSION: Interval placement of LEFT IJ central line.  No pneumothorax. LEFT LOWER lobe atelectasis. Electronically Signed   By: Nolon Nations M.D.   On: 02/20/2018 16:50   Dg Chest Port 1 View  Result Date: 02/20/2018 CLINICAL DATA:  Endotracheal tube placement EXAM: PORTABLE CHEST 1 VIEW COMPARISON:  02/20/2018 FINDINGS: Endotracheal tube 6.5 cm above the carina unchanged from earlier today. NG tube enters the stomach with side hole in the distal esophagus similar to the prior study. Mild bibasilar atelectasis similar to the prior study. Negative for edema or effusion IMPRESSION: Endotracheal tube 6.5 cm above the carina unchanged NG tube in the distal esophagus unchanged Mild bibasilar atelectasis unchanged Electronically Signed   By: Franchot Gallo M.D.   On: 02/20/2018 15:34    Cardiac Studies:  ECG:   NSR no afib    Echo:  AV vegetation no AR/AS EF 65%  Medications:   . aspirin  325 mg Oral Daily   Or  . aspirin  300 mg Rectal Daily  . chlorhexidine gluconate ([name] KIT)  15 mL Mouth Rinse BID  . Chlorhexidine Gluconate Cloth  6 each Topical Q0600  .  feeding supplement (PRO-STAT SUGAR FREE 64)  30 mL Per Tube BID  . feeding supplement (VITAL HIGH PROTEIN)  1,000 mL Per Tube Q24H  . folic acid  1 mg Oral Daily  . mouth rinse  15 mL Mouth Rinse 10 times per day  . mupirocin ointment  1 application Nasal BID  . vitamin B-12  1,000 mcg Oral Daily     . sodium chloride 1 mL/hr at 02/22/18 0700  . clevidipine 3 mg/hr (02/22/18 0700)  . famotidine (PEPCID) IV Stopped (02/21/18 2344)  . propofol (DIPRIVAN) infusion Stopped (02/21/18 1750)  . sodium chloride (hypertonic) 50  mL/hr at 02/22/18 0700    Assessment/Plan:   SBE:  With AV vegetation and lart left MCA CVA. No aortic stenosis or regurgitation EF is normal Does not appear to be on antibiotics at this time ? ID consult for culture negative endocarditis Will need CTA chest to look at rest of aorta and arch when more stable Post craniotomy yesterday to help minimize high intracranial pressure and risk of nuchal herniation Next 72 hours. On clevidipine to prevent spasm   Jenkins Rouge 02/22/2018, 8:15 AM

## 2018-02-22 NOTE — Evaluation (Signed)
Clinical/Bedside Swallow Evaluation Patient Details  Name: Xavier Villegas MRN: [id number] Date of Birth: [birth year]  Today's Date: 02/22/2018 Time: SLP Start Time (ACUTE ONLY): 0955 SLP Stop Time (ACUTE ONLY): 1016 SLP Time Calculation (min) (ACUTE ONLY): 21 min  Past Medical History:  Past Medical History:  Diagnosis Date  . Tobacco abuse    Past Surgical History:  HPI:   This is a 37 year old who presented with a right hemiplegia who was found to have a left carotid dissection as well as an MCA thrombosis.  A thrombectomy was performed however this appears to have reclosed on a follow-up MRI.  He has evolved a large left MCA territory infarct with hemorrhagic transformation. Intubated from 7/13-7/16, self extubated.    Assessment / Plan / Recommendation Clinical Impression  Pt demonstrates good potential for PO intake in the near future. He self extubated last night and though he is unable to phonate on command or verbalize, he was heard to have a dysphonic cough when attempting to swallow thin liquids. Pt was able to self feed honey thick liquids and accepted spoons of puree without significant signs of difficulty. Hopeful that oral dysphagia is mild and pharyngeal dysphagia is secondary to intubation rather then any neuromuscular deficits. Will plan to f/u tomorrow for further trials for readiness for FEES and potential diet initiation.  SLP Visit Diagnosis: Dysphagia, oropharyngeal phase (R13.12)    Aspiration Risk  Moderate aspiration risk    Diet Recommendation NPO except meds   Medication Administration: Crushed with puree(if necessary)    Other  Recommendations     Follow up Recommendations Inpatient Rehab      Frequency and Duration min 2x/week  2 weeks       Prognosis Prognosis for Safe Diet Advancement: Good      Swallow Study   General HPI:  This is a 37 year old who presented with a right hemiplegia who was found to have a left carotid dissection as  well as an MCA thrombosis.  A thrombectomy was performed however this appears to have reclosed on a follow-up MRI.  He has evolved a large left MCA territory infarct with hemorrhagic transformation. Intubated from 7/13-7/16, self extubated.  Type of Study: Bedside Swallow Evaluation Previous Swallow Assessment: none Diet Prior to this Study: NPO Temperature Spikes Noted: No Respiratory Status: Room air History of Recent Intubation: Yes Length of Intubations (days): 4 days Date extubated: 02/22/18 Behavior/Cognition: Requires cueing;Lethargic/Drowsy Oral Cavity Assessment: Within Functional Limits Oral Care Completed by SLP: No Oral Cavity - Dentition: Adequate natural dentition Vision: Functional for self-feeding Self-Feeding Abilities: Needs assist Patient Positioning: Upright in chair Baseline Vocal Quality: Hoarse(hoarse cough) Volitional Cough: Cognitively unable to elicit Volitional Swallow: Unable to elicit    Oral/Motor/Sensory Function Overall Oral Motor/Sensory Function: Moderate impairment Facial ROM: Reduced right;Suspected CN VII (facial) dysfunction Facial Symmetry: Abnormal symmetry right;Suspected CN VII (facial) dysfunction Facial Strength: Reduced right;Suspected CN VII (facial) dysfunction   Ice Chips Ice chips: Impaired Presentation: Spoon Oral Phase Impairments: Reduced labial seal;Impaired mastication Oral Phase Functional Implications: Prolonged oral transit   Thin Liquid Thin Liquid: Impaired Presentation: Cup;Self Fed Oral Phase Impairments: Reduced labial seal Pharyngeal  Phase Impairments: Suspected delayed Swallow;Cough - Immediate    Nectar Thick Nectar Thick Liquid: Not tested   Honey Thick Honey Thick Liquid: Within functional limits   Puree Puree: Within functional limits   Solid   GO   Solid: Not tested       Xavier Baltimore, MA CCC-SLP [phone]  Xavier Villegas, Xavier Villegas  Xavier Villegas 02/22/2018,11:17 AM

## 2018-02-22 NOTE — Evaluation (Signed)
Speech Language Pathology Evaluation Patient Details Name: Finnean Cerami MRN: [id number] DOB: 12-Nov-1980 Today's Date: 02/22/2018 Time: 5830-9407 SLP Time Calculation (min) (ACUTE ONLY): 22 min  Problem List:  Patient Active Problem List   Diagnosis Date Noted  . Stroke (cerebrum) ([place]) 02/19/2018  . Acute respiratory insufficiency    Past Medical History:  Past Medical History:  Diagnosis Date  . Tobacco abuse     HPI:   This is a 37 year old who presented with a right hemiplegia who was found to have a left carotid dissection as well as an MCA thrombosis.  A thrombectomy was performed however this appears to have reclosed on a follow-up MRI.  He has evolved a large left MCA territory infarct with hemorrhagic transformation. Intubated from 7/13-7/16, self extubated.    Assessment / Plan / Recommendation Clinical Impression  Pt demonstrates some aspects of present comprehension, but requires visual cues in most instances to follow one step basic commands. 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 Pt follows context well and initiates some basic functional tasks automatically. Pt unable to initaite phonation or articulate, though he did open his mouth intermittently with attempts to speak. Overall, pt presents as globally aphasic today, though apraxia may be masking comprehension ability. Will follow to faciliate functional communication. Introduced education topics with family. Pt is recommended to f/u care in CIR setting.     SLP Assessment  SLP Recommendation/Assessment: Patient needs continued Speech Lanaguage Pathology Services SLP Visit Diagnosis: Aphasia (R47.01);Apraxia (R48.2);Dysarthria and anarthria (R47.1)    Follow Up Recommendations  Inpatient Rehab    Frequency and Duration min 2x/week  2 weeks      SLP Evaluation Cognition  Overall Cognitive Status: Impaired/Different from baseline Arousal/Alertness:  Lethargic Orientation Level: (UTA, aphasia) Attention: Focused;Sustained Focused Attention: Appears intact Sustained Attention: Impaired Sustained Attention Impairment: Verbal basic;Functional basic       Comprehension  Auditory Comprehension Overall Auditory Comprehension: Impaired Yes/No Questions: Not tested Commands: Impaired One Step Basic Commands: 25-49% accurate Conversation: Simple Reading Comprehension Reading Status: Not tested    Expression Verbal Expression Overall Verbal Expression: Impaired Initiation: Impaired(could not initiate speech, opened mouth - groping) Written Expression Dominant Hand: Right   Oral / Motor  Oral Motor/Sensory Function Overall Oral Motor/Sensory Function: Moderate impairment Facial ROM: Reduced right;Suspected CN VII (facial) dysfunction Facial Symmetry: Abnormal symmetry right;Suspected CN VII (facial) dysfunction Facial Strength: Reduced right;Suspected CN VII (facial) dysfunction Motor Speech Overall Motor Speech: Impaired Respiration: Within functional limits Phonation: Other (comment)(could not initiate)   GO                   Herbie Baltimore, MA CCC-SLP [phone]  Lynann Beaver 02/22/2018, 11:29 AM

## 2018-02-22 NOTE — Progress Notes (Signed)
Patient ID: Xavier Villegas, male   DOB: 05-02-1981, 37 y.o.   MRN: [id number] Patient stable awakens eyes open to voice will not follow commands spontaneous purposeful movement left side right hemiplegia. Incision clean dry and intact.  Posterior day 1 decompressive hemicraniectomy and duraplasty for large dominant hemisphere left MCA stroke. Patient appears to be a phasic and right hemiplegic stable from preop.  Sedation mobilization per neurology
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
## 2018-02-22 NOTE — Progress Notes (Signed)
STROKE TEAM PROGRESS NOTE   SUBJECTIVE (INTERVAL HISTORY) His dad and brother are at the bedside.  Pt self extubated overnight but tolerating well. S/p [facility] yesterday. Now sitting in chair, right eye open, able to follow some central commands. Still aphasic with right hemiplegia. Has not passed swallow yet. Na 157.  OBJECTIVE Temp:  [98.5 F (36.9 C)-100.9 F (38.3 C)] 100.9 F (38.3 C) (07/16 0800) Pulse Rate:  [53-156] 64 (07/16 0900) Cardiac Rhythm: Normal sinus rhythm;Sinus tachycardia (07/16 0400) Resp:  [13-23] 14 (07/16 0900) BP: (117-175)/(55-141) 141/82 (07/16 0900) SpO2:  [94 %-100 %] 97 % (07/16 0900) FiO2 (%):  [30 %-40 %] 30 % (07/15 2302) Weight:  [184 lb 15.5 oz (83.9 kg)] 184 lb 15.5 oz (83.9 kg) (07/16 0412)  CBC:  Recent Labs  Lab 02/20/18 0547 02/21/18 0539 02/22/18 0516  WBC 19.5* 14.2* 18.3*  NEUTROABS 17.4*  --  15.2*  HGB 15.2 15.1 14.5  HCT 43.0 44.2 42.0  MCV 109.1* 112.5* 112.0*  PLT 174 157 222    Basic Metabolic Panel:  Recent Labs  Lab 02/21/18 0539  02/21/18 1702  02/22/18 0516 02/22/18 1043  NA 150*   < > 157*   < > 157* 157*  K 3.4*  --   --   --  3.2*  --   CL 118*  --   --   --  121*  --   CO2 25  --   --   --  27  --   GLUCOSE 120*  --   --   --  127*  --   BUN 12  --   --   --  5*  --   CREATININE 0.70  --   --   --  0.65  --   CALCIUM 8.0*  --   --   --  8.5*  --   MG 1.8  --  1.8  --  2.0  --   PHOS 2.6  --  2.9  --   --   --    < > = values in this interval not displayed.    Lipid Panel:     Component Value Date/Time   CHOL 101 02/20/2018 0547   TRIG 103 02/20/2018 0547   HDL 34 (L) 02/20/2018 0547   CHOLHDL 3.0 02/20/2018 0547   VLDL 21 02/20/2018 0547   LDLCALC 46 02/20/2018 0547   HgbA1c:  Lab Results  Component Value Date   HGBA1C 4.9 02/20/2018   Urine Drug Screen:     Component Value Date/Time   LABOPIA NONE DETECTED 02/19/2018 1655   COCAINSCRNUR NONE DETECTED 02/19/2018 1655   LABBENZ NONE DETECTED  02/19/2018 1655   AMPHETMU NONE DETECTED 02/19/2018 1655   THCU NONE DETECTED 02/19/2018 1655   LABBARB (A) 02/19/2018 1655    Result not available. Reagent lot number recalled by manufacturer.    Alcohol Level No results found for: [facility]  IMAGING I have personally reviewed the radiological images below and agree with the radiology interpretations.  Ct Angio Head W Or Wo Contrast Ct Angio Neck W Or Wo Contrast Ct Cerebral Perfusion W Contrast 02/19/2018 IMPRESSION:  Large territory left MCA acute infarct. 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 Occlusion left A1 with reconstitution left A2. Occlusion left M1 and M2 segments.   IR Angiogram Dr Earleen Newport 02/19/2018 3:08 PM Findings:  Left proximal cervical  ICA dissection/thrombus Left M1 occlusion First pass, TICI 2a, with emboli in distal M1/M2 Second pass, TICI 2a, with persistent occlusion of M2 branch Third pass, TICI 2b Occlusive left ICA dissection, with patent ACA and maintained cross-flow from right to left Posterior circulation perfuses the left temporal and occipital region Flat panel CT shows SAH/ICH Complications: ICH  Ct Head Wo Contrast 02/19/2018 IMPRESSION:  Left MCA territory infarction with early low-density and swelling but no mass effect or shift at this time. I think most the hyperdensity seen previously relates to contrast staining, much of which is diminishing or resolved. There is some persistent contrast staining in the region of the insula and I could not exclude that there is a small amount of admixed hemorrhage.   Ct Head Wo Contrast 02/19/2018 IMPRESSION:  Large territory acute infarct left MCA territory. Interval development of multiple areas of patchy hemorrhage within the infarct following clot retrieval. No midline shift.   Ct Head Code Stroke Wo Contrast 02/19/2018 IMPRESSION:   1. Hypodensities left frontal lobe and left occipital parietal lobe most compatible with acute infarct. Possible emboli or watershed infarct. Negative for hemorrhage or mass-effect  2. ASPECTS is 8   MRI / MRA Acute infarct of the entire left MCA territory with mild associated hemorrhage in the left parietal and frontal lobe. Diffuse edema. 2 mm midline shift to the right. No other infarct Occlusion of the left internal carotid artery and entire left middle cerebral artery  Transthoracic Echocardiogram - Left ventricle: The cavity size was normal. Wall thickness was   increased in a pattern of mild LVH. Systolic function was normal.   The estimated ejection fraction was in the range of 55% to 60%.   Wall motion was normal; there were no regional wall motion   abnormalities. Left ventricular diastolic function parameters   were normal. - Aortic valve: There was a small, mobile vegetation on the left   ventricular aspect. Impressions: - There is a small - medium sized mobile mass on the LV aspect of   the aortic valve.   The appearence is c/w vegetation.   Aortic valve endocarditis  VAS [place] LOWER EXTREMITY VENOUS (DVT) There is no DVT or SVT noted in the bilateral lower extremities.   CUS  R: no ICA stenosis. The left ICA appears occluded.  Right vertebral artery flow is antegrade.  Left vertebral artery not insonated secondary to patient's positioning on vent.  Ct Head Wo Contrast 02/21/2018 CLINICAL DATA:  Stroke follow-up EXAM: CT HEAD WITHOUT CONTRAST TECHNIQUE: Contiguous axial images were obtained from the base of the skull through the vertex without intravenous contrast. COMPARISON:  Head CT 02/19/2018 FINDINGS: Brain: There is increased hypodensity throughout the left MCA distribution in the location of the known infarct. Hyperdense material within the infarcted region has also decreased in density, with no increased volume. Mass effect on the left lateral ventricle is  increased and there is now rightward midline shift measuring 4 mm at the level of the foramina of [place]. No hydrocephalus. Right hemisphere and cerebellum are normal. Vascular: Hyperattenuation of the left middle cerebral artery. Skull: The visualized skull base, calvarium and extracranial soft tissues are normal. Sinuses/Orbits: No fluid levels or advanced mucosal thickening of the visualized paranasal sinuses. No mastoid or middle ear effusion. The orbits are normal. IMPRESSION: 1. Progression of cytotoxic edema throughout the left MCA distribution, causing slight mass effect on the left lateral ventricle with 4 mm of rightward midline shift. No hydrocephalus. 2. Decreased density  in unchanged distribution of hyperattenuating material in the left MCA territory, favored to indicate resolving contrast staining. Electronically Signed   By: Ulyses Jarred M.D.   On: 02/21/2018 05:15   CT head pending   PHYSICAL EXAM Temp:  [98.5 F (36.9 C)-100.9 F (38.3 C)] 100.9 F (38.3 C) (07/16 0800) Pulse Rate:  [53-156] 64 (07/16 0900) Resp:  [13-23] 14 (07/16 0900) BP: (117-175)/(55-141) 141/82 (07/16 0900) SpO2:  [94 %-100 %] 97 % (07/16 0900) FiO2 (%):  [30 %-40 %] 30 % (07/15 2302) Weight:  [184 lb 15.5 oz (83.9 kg)] 184 lb 15.5 oz (83.9 kg) (07/16 0412)  General - Well nourished, well developed, lethargic.  Ophthalmologic - fundi not visualized due to noncooperation and right eyelid swollen.  Cardiovascular - Regular rate and rhythm.  Neuro - extubated and tolerating well, sitting in chair. Right eye open but left eyelid swollen not able to open left eye. Right pupil brisk light reflex, but not able to see left pupil. Left gaze preference, barely cross midline. Able to follow some central commands like open close eyes and open mouth but not peripheral command. Not blinking to visual threat on the right. Right facial droop. LUE and LLE spontaneous movement against gravity. RUE 0/5 but with increased  tone and RLE 2/5 on pain stimulation. DTR 1+ and right babinski positive. Sensation, coordination and gait not tested.   ASSESSMENT/PLAN Xavier Villegas is a 37 y.o. male with no significant past medical history other than tobacco use presenting with right-sided weakness and aphasia. He did not receive IV t-PA due to late presentation.  Attempted thrombectomy with subsequent left ICA dissection and possible ICH.  Stroke: large left MCA territory infarct due to left ICA and MCA occlusion, etiology unclear, could be due to ICA dissection or aortic valve vegetation shown on TTE   Resultant  Intubated, right hemiplegia, aphasia  CT head - Large territory acute infarct left MCA territory.  IR - occlusive left ICA but TICI2b left MCA  MRI head - large left MCA infarct with small ICH/SAH hemorrhagic conversion  MRA head - left ICA and MCA remain occluded  CTA H&N - Acute dissection left internal carotid artery with intraluminal thrombus  LE Venous Dopplers - no DVT  Carotid Doppler - left ICA occluded  2D Echo - EF 55-60% and aortic valve mass vs. vegetation  consider TEE once more stable  Hypercoagulable work up pending  LDL - 46  HgbA1c - 4.9  VTE prophylaxis - subq heparin  No antithrombotic prior to admission, now on ASA.   Ongoing aggressive stroke risk factor management  Therapy recommendations:  CIR  Disposition:  Pending  Cerebral edema  MRA large left MCA infarct with 25mm midline shift 02/20/18  Repeat CT showed mass effect with 49mm midline shift 02/21/18  On 3% saline  Na goal 150-160  Na Q6h  S/p [facility] - mental status much improved  CT repeat pending  Aortic valve mass vs. vegetation  TTE concerning for AV vegetation vs. mass  Blood culture NGTD  WBC 15.4->19.5->14.2->18.3  Tmax 100.6->97.2->100.9  Cardiology consult appreciated  Consider TEE once stable  Will consult ID if needed  Left ICA dissection ??  Girlfriend stated that  he bumped his head to window 3 weeks ago  Left ICA occlusion   Tobacco abuse  Current smoker  Smoking cessation counseling will be provided   Other Stroke Risk Factors  ETOH use, advised to drink no more than 1 alcoholic beverage per  day.  Family hx of stroke (grandparents)  Other Active Problems  Low FA level - 1.5 - on FA supplement  Low normal B12 level - 224 -supplementation ordered  Hospital day # 3  This patient is critically ill due to large left MCA infarct with hemorrhagic conversion, endocarditis, smoker and at significant risk of neurological worsening, death form recurrent stroke, hemorrhage, heart failure, seizure, brain herniation. This patient's care requires constant monitoring of vital signs, hemodynamics, respiratory and cardiac monitoring, review of multiple databases, neurological assessment, discussion with family, other specialists and medical decision making of high complexity. I spent 40 minutes of neurocritical care time in the care of this patient. I had long discussion with dad and brother at bedside, updated pt current condition, treatment plan and potential prognosis. They expressed understanding and appreciation. I also discussed with Dr. Pearline Cables from [place].   Rosalin Hawking, MD PhD Stroke Neurology 02/22/2018 11:46 AM  To contact Stroke Continuity provider, please refer to [email or web address] After hours, contact General Neurology

## 2018-02-22 NOTE — Progress Notes (Signed)
RT called to patient room per patient self extubating. Patient on [facility] with sats of 97%. All vitals are stable. MD aware.

## 2018-02-22 NOTE — Consult Note (Signed)
Physical Medicine and Rehabilitation Consult   Reason for Consult: Stroke with functional deficits.  Referring Physician: Dr. Erlinda Hong   HPI: Xavier Villegas is a 37 y.o. male with history of tobacco use otherwise in good health who was admitted on 02/19/18 after found down on the floor by girlfriend. He was found to have aphasia with right sided weakness and CTA/P head/neck showed large L-MCA infarct with acute dissection of L-ICA with intraluminal thrombus. He underwent cerebral angio with mechanical thrombectomy of left MCA with reperfusion and confirmation of acute left left ICA dissection as source of emboli.  Question stroke due to dissection due to reports of hyperextension injury 3 weeks PTA. CT post procedure showed SAH with hemorrhagic transformation and follow up CT head done revealing progressive cytotoxic edema with slight mass effect and midline shift.  On attempts at propofol wean, he was found to have difficulty opening his eyes with dilated left pupil. Dr. Saintclair Halsted consulted for input and patient underwent left decompressive hemicraniectomy with implantation of crani flap in LMQ on 7/15   2D echo showed EF 55-60% with medium size mobile mass on LV aspect of aortic valve c/w vegetation. BLE dopplers negative for DVT. Carotid dopplers showed total occlusion of L-ICA. Dr. Johnsie Cancel consulted for input and endocarditis recommends TEE in a few days as well as recommended ID consult for input on antibiotics.   Patient self extubated today and respiratory status stable. Therapy evaluations done revealing dysphonia, mild oropharyngeal dysphagia, right hemiplegia, left ptosis with edema as well as question of right visual deficits affecting mobility. CIR recommended due to functional deficits.    Review of Systems  Unable to perform ROS: Patient nonverbal  Limited history as patient non-verbal and no family in the room.  Past Medical History:  Diagnosis Date  . Tobacco abuse      Family History  Problem Relation Age of Onset  . Stroke Maternal Grandmother   . Stroke Maternal Grandfather     Social History:  Per reports he has been smoking cigarettes.  He has been smoking about 0.50 packs per day. He has never used smokeless tobacco. Per reports he drinks about 8.4 oz of alcohol per week. Per reports he does not use drugs.    Allergies: No Known Allergies    No medications prior to admission.    Home: Home Living Family/patient expects to be discharged to:: Private residence Living Arrangements: Parent Available Help at Discharge: Family, Available PRN/intermittently Type of Home: House Home Access: Stairs to enter, Ramped entrance [employer] of Steps: 4-5 steps to basement apartment, ramp on main floor Home Layout: Two level, Able to live on main level with bedroom/bathroom Bathroom Shower/Tub: Multimedia programmer: Handicapped height [place]: [place] - 2 wheels, Bedside commode, Other (comment) Additional Comments: equipment is mom's but she isn't using. 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 pt able to bring LLE off of bed with multimodal cues but requires assist for RLE and trunk elevation Transfers  Overall transfer level: Needs assistance Equipment used: 2 person hand held assist Transfers: Sit to/from Stand, Stand Pivot Transfers Sit to Stand: Max assist, +2  physical assistance, +2 safety/equipment, From elevated surface Stand pivot transfers: Max assist, +2 physical assistance, +2 safety/equipment, From elevated surface General transfer comment: Pt initiated with L side . pt requires blocking of R side and input at the R hip as pt with tendency for left lean with right hip lateral protrusion . Pt with input at the sacrum for trunk activation for static standing. Pt able to step with L LE toward chair with R side completely supported by therapist. Bil UE supported with sit to stand x 2 from bed Ambulation/Gait General Gait Details: unable    ADL: ADL Overall ADL's : Needs assistance/impaired Eating/Feeding: NPO Eating/Feeding Details (indicate cue type and reason): SLP arriving at the end of session Grooming: Maximal assistance Upper Body Bathing: Maximal assistance Lower Body Bathing: Total assistance Upper Body Dressing : Maximal assistance Lower Body Dressing: Total assistance Lower Body Dressing Details (indicate cue type and reason): don socks this session with knee flexion bil knees in response to tactile input to the bottom of foot. pt does not like tactile input to the sole of foot.  General ADL Comments: pt initiated task with incr time and tactile input. pt completed EOB and EOB to chair transfer. Pt scanning R to locate father in the room. noted L eye completely swollen closed  Cognition: Cognition Overall Cognitive Status: Difficult to assess Arousal/Alertness: Lethargic Orientation Level: (UTA, aphasia) Attention: Focused, Sustained Focused Attention: Appears intact Sustained Attention: Impaired Sustained Attention Impairment: Verbal basic, Functional basic Cognition Arousal/Alertness: Lethargic Behavior During Therapy: Flat affect Overall Cognitive Status: Difficult to assess Area of Impairment: Attention, Following commands, Safety/judgement Current Attention Level: Focused Following Commands: Follows one step commands  inconsistently Safety/Judgement: Decreased awareness of safety, Decreased awareness of deficits General Comments: pt does not consistently follow verbal or tactile commands. He will lift LUE when asked to give thumbs up, will at time bend knee or perform action verbally stated. Pt will use LUE to attempt to find noxious stimuli on right   Blood pressure 134/80, pulse (!) 52, temperature 99.9 F (37.7 C), temperature source Axillary, resp. rate 18, height 6' (1.829 m), weight 83.9 kg (184 lb 15.5 oz), SpO2 96 %. Physical Exam  Nursing note and vitals reviewed. Constitutional: He appears well-developed and well-nourished. He appears lethargic.  WNWD male with left gaze preference and mitten on left hand.   HENT:  Dry dressing left scalp. Marked edema left fronto temporal  periorbital  Eyes:  Left lid edema with inability to open. Right pupil slow to respond.   Cardiovascular: Normal rate, regular rhythm and normal heart sounds. Exam reveals no friction rub.  No murmur heard. Respiratory: Effort normal and breath sounds normal. No respiratory distress. He has no wheezes.  GI: Soft. Bowel sounds are normal. He exhibits no distension. There is no tenderness.  Neurological: He appears lethargic.  Able to open mouth to command but unable to phonate. Left gaze preference and significant left lid edema. Able to move right pupil to midline with cues. Right visual field deficits. Dense right hemiparesis. RLE withdraws to pain.   0/5 RUE and RLE Hyporeflexic on RIght side  Non AMb  Limited tracking with R eye  Skin: Skin is warm and dry.  Left crani incision small amt blood on gauze  RUQ abd incsion  Results for orders placed or performed during the hospital  encounter of 02/19/18 (from the past 24 hour(s))  Sodium     Status: Abnormal   Collection Time: 02/21/18  5:02 PM  Result Value Ref Range   Sodium 157 (H) 135 - 145 mmol/L  Magnesium     Status: None   Collection Time: 02/21/18  5:02 PM   Result Value Ref Range   Magnesium 1.8 1.7 - 2.4 mg/dL  Phosphorus     Status: None   Collection Time: 02/21/18  5:02 PM  Result Value Ref Range   Phosphorus 2.9 2.5 - 4.6 mg/dL  Glucose, capillary     Status: None   Collection Time: 02/21/18  5:18 PM  Result Value Ref Range   Glucose-Capillary 82 70 - 99 mg/dL  Glucose, capillary     Status: Abnormal   Collection Time: 02/21/18  8:02 PM  Result Value Ref Range   Glucose-Capillary 107 (H) 70 - 99 mg/dL  Sodium     Status: Abnormal   Collection Time: 02/21/18 11:15 PM  Result Value Ref Range   Sodium 156 (H) 135 - 145 mmol/L  Glucose, capillary     Status: Abnormal   Collection Time: 02/21/18 11:38 PM  Result Value Ref Range   Glucose-Capillary 146 (H) 70 - 99 mg/dL  Glucose, capillary     Status: Abnormal   Collection Time: 02/22/18  3:51 AM  Result Value Ref Range   Glucose-Capillary 131 (H) 70 - 99 mg/dL  Basic metabolic panel     Status: Abnormal   Collection Time: 02/22/18  5:16 AM  Result Value Ref Range   Sodium 157 (H) 135 - 145 mmol/L   Potassium 3.2 (L) 3.5 - 5.1 mmol/L   Chloride 121 (H) 98 - 111 mmol/L   CO2 27 22 - 32 mmol/L   Glucose, Bld 127 (H) 70 - 99 mg/dL   BUN 5 (L) 6 - 20 mg/dL   Creatinine, Ser 0.65 0.61 - 1.24 mg/dL   Calcium 8.5 (L) 8.9 - 10.3 mg/dL   GFR calc non Af Amer >60 >60 mL/min   GFR calc Af Amer >60 >60 mL/min   Anion gap 9 5 - 15  CBC with Differential/Platelet     Status: Abnormal   Collection Time: 02/22/18  5:16 AM  Result Value Ref Range   WBC 18.3 (H) 4.0 - 10.5 K/uL   RBC 3.75 (L) 4.22 - 5.81 MIL/uL   Hemoglobin 14.5 13.0 - 17.0 g/dL   HCT 42.0 39.0 - 52.0 %   MCV 112.0 (H) 78.0 - 100.0 fL   MCH 38.7 (H) 26.0 - 34.0 pg   MCHC 34.5 30.0 - 36.0 g/dL   RDW 13.7 11.5 - 15.5 %   Platelets 197 150 - 400 K/uL   Neutrophils Relative % 83 %   Lymphocytes Relative 8 %   Monocytes Relative 9 %   Eosinophils Relative 0 %   Basophils Relative 0 %   Neutro Abs 15.2 (H) 1.7 - 7.7  K/uL   Lymphs Abs 1.5 0.7 - 4.0 K/uL   Monocytes Absolute 1.6 (H) 0.1 - 1.0 K/uL   Eosinophils Absolute 0.0 0.0 - 0.7 K/uL   Basophils Absolute 0.0 0.0 - 0.1 K/uL   Smear Review MORPHOLOGY UNREMARKABLE   Magnesium     Status: None   Collection Time: 02/22/18  5:16 AM  Result Value Ref Range   Magnesium 2.0 1.7 - 2.4 mg/dL  Glucose, capillary     Status: Abnormal   Collection Time: 02/22/18  8:13  AM  Result Value Ref Range   Glucose-Capillary 132 (H) 70 - 99 mg/dL   Comment 1 Notify RN    Comment 2 Document in Chart   Sodium     Status: Abnormal   Collection Time: 02/22/18 10:43 AM  Result Value Ref Range   Sodium 157 (H) 135 - 145 mmol/L  Glucose, capillary     Status: Abnormal   Collection Time: 02/22/18 11:31 AM  Result Value Ref Range   Glucose-Capillary 120 (H) 70 - 99 mg/dL   Ct Head Wo Contrast  Result Date: 02/21/2018 CLINICAL DATA:  Stroke follow-up EXAM: CT HEAD WITHOUT CONTRAST TECHNIQUE: Contiguous axial images were obtained from the base of the skull through the vertex without intravenous contrast. COMPARISON:  Head CT 02/19/2018 FINDINGS: Brain: There is increased hypodensity throughout the left MCA distribution in the location of the known infarct. Hyperdense material within the infarcted region has also decreased in density, with no increased volume. Mass effect on the left lateral ventricle is increased and there is now rightward midline shift measuring 4 mm at the level of the foramina of [place]. No hydrocephalus. Right hemisphere and cerebellum are normal. Vascular: Hyperattenuation of the left middle cerebral artery. Skull: The visualized skull base, calvarium and extracranial soft tissues are normal. Sinuses/Orbits: No fluid levels or advanced mucosal thickening of the visualized paranasal sinuses. No mastoid or middle ear effusion. The orbits are normal. IMPRESSION: 1. Progression of cytotoxic edema throughout the left MCA distribution, causing slight mass effect on the  left lateral ventricle with 4 mm of rightward midline shift. No hydrocephalus. 2. Decreased density in unchanged distribution of hyperattenuating material in the left MCA territory, favored to indicate resolving contrast staining. Electronically Signed   By: Ulyses Jarred M.D.   On: 02/21/2018 05:15   Dg Chest Port 1 View  Result Date: 02/21/2018 CLINICAL DATA:  Hypoxia EXAM: PORTABLE CHEST 1 VIEW COMPARISON:  February 20, 2018 FINDINGS: Endotracheal tube tip is 4.0 cm above the carina. Nasogastric tube tip and side port are below the diaphragm. Central catheter tip is at the cavoatrial junction. No pneumothorax. There is atelectatic change in the lung bases. Lungs elsewhere clear. Heart is upper normal in size with pulmonary vascularity normal. No adenopathy. No bone lesions. IMPRESSION: Tube and catheter positions as described without pneumothorax. Lower lobe atelectatic change. No edema or consolidation. Stable cardiac silhouette. Electronically Signed   By: Lowella Grip III M.D.   On: 02/21/2018 07:25   Dg Chest Port 1 View  Result Date: 02/20/2018 CLINICAL DATA:  Respiratory failure,central line placement EXAM: PORTABLE CHEST 1 VIEW COMPARISON:  02/20/2018 FINDINGS: Endotracheal tube is in place, tip approximately 7.1 centimeters above the carina. Nasogastric tube is in place, tip beyond the level of the mid stomach. A LEFT IJ central line tip overlies the superior vena cava and is new since the previous exam. There is no pneumothorax. Heart size is normal. Mild subsegmental atelectasis in the MEDIAL LEFT lung base. Remote RIGHT rib fracture. IMPRESSION: Interval placement of LEFT IJ central line.  No pneumothorax. LEFT LOWER lobe atelectasis. Electronically Signed   By: Nolon Nations M.D.   On: 02/20/2018 16:50     Assessment/Plan: Diagnosis: Left MCA infarct with Aphasia, Left flaccid hemiplegia and dysphagia 1. Does the need for close, 24 hr/day medical supervision in concert with the  patient's rehab needs make it unreasonable for this patient to be served in a less intensive setting? Yes 2. Co-Morbidities requiring supervision/potential complications: S/p crani with  cerebral edema, leukocytosis 3. Due to bladder management, bowel management, safety, skin/wound care, disease management, medication administration, pain management and patient education, does the patient require 24 hr/day rehab nursing? Yes 4. Does the patient require coordinated care of a physician, rehab nurse, PT (1-2 hrs/day, 5 days/week), OT (1-2 hrs/day, 5 days/week) and SLP (.5-1 hrs/day, 5 days/week) to address physical and functional deficits in the context of the above medical diagnosis(es)? Yes Addressing deficits in the following areas: balance, endurance, locomotion, strength, transferring, bowel/bladder control, bathing, dressing, feeding, grooming, toileting, cognition, speech, language, swallowing and psychosocial support 5. Can the patient actively participate in an intensive therapy program of at least 3 hrs of therapy per day at least 5 days per week? No 6. The potential for patient to make measurable gains while on inpatient rehab is good 7. Anticipated functional outcomes upon discharge from inpatient rehab are min assist  with PT, min assist with OT, min assist with SLP. 8. Estimated rehab length of stay to reach the above functional goals is: 20-25 d 9. Anticipated D/C setting: Home 10. Anticipated post D/C treatments: [place] therapy 11. Overall Rehab/Functional Prognosis: good  RECOMMENDATIONS: This patient's condition is appropriate for continued rehabilitative care in the following setting: CIR once able to tolerate 3hr per day Patient has agreed to participate in recommended program. N/A Note that insurance prior authorization may be required for reimbursement for recommended care.  Comment: need to monitor increasing edema in neuro setting for now  "I have personally performed a face to  face diagnostic evaluation of this patient.  Additionally, I have reviewed and concur with the physician assistant's documentation above." Charlett Blake M.D. [facility] FAAPM&R (Sports Med, Neuromuscular Med) Diplomate Am Board of [place], PA-C 02/22/2018

## 2018-02-22 NOTE — Evaluation (Signed)
Physical Therapy Evaluation Patient Details Name: Xavier Villegas MRN: [id number] DOB: 1981/05/02 Today's Date: 02/22/2018   History of Present Illness  37 yo found down by girlfriend 7/13 with aphasia and right weakness with Lt MCA infarct with left ICA dissection s/p IR with reocclusion and hemorrhagic transformation with SAH, hemicraniectomy for decompression 7/15, pt with aortic valve vegetation. Pt self-extubated 7/16. No significant PMHx  Clinical Impression  Pt lethargic on arrival with cueing and stimuli to arouse pt. Pt reaching for left eye to open lids with LUE. Pt with flat affect, no vocalizations throughout session, inconsistent command following, right lean, right hemiplegia, impaired function, balance and cognition. Pt with above and below deficits who will benefit from acute therapy to maximize mobility, balance and function to decrease burden of care. Dad and uncle present and educated throughout session.      Follow Up Recommendations CIR;Supervision/Assistance - 24 hour    Equipment Recommendations  Wheelchair cushion (measurements PT);Wheelchair (measurements PT);3in1 (PT)    Recommendations for Other Services       Precautions / Restrictions Precautions Precautions: Fall Precaution Comments: bone flap in L abdomen/ L hand mitten Restrictions Weight Bearing Restrictions: No      Mobility  Bed Mobility Overal bed mobility: Needs Assistance Bed Mobility: Rolling;Sidelying to Sit Rolling: +2 for physical assistance;Max assist Sidelying to sit: HOB elevated;Max assist;+2 for physical assistance Supine to sit: +2 for physical assistance;Max assist;HOB elevated     General bed mobility comments: cues for sequence with assist to bend knee and roll toward left. pt able to bring LLE off of bed with multimodal cues but requires assist for RLE and trunk elevation  Transfers Overall transfer level: Needs assistance Equipment used: 2 person hand held  assist Transfers: Sit to/from [employer] Transfers Sit to Stand: Max assist;+2 physical assistance;+2 safety/equipment;From elevated surface Stand pivot transfers: Max assist;+2 physical assistance;+2 safety/equipment;From elevated surface       General transfer comment: Pt initiated with L side . pt requires blocking of R side and input at the R hip as pt with tendency for left lean with right hip lateral protrusion . Pt with input at the sacrum for trunk activation for static standing. Pt able to step with L LE toward chair with R side completely supported by therapist. Bil UE supported with sit to stand x 2 from bed  Ambulation/Gait             General Gait Details: unable  Stairs            Wheelchair Mobility    Modified Rankin (Stroke Patients Only) Modified Rankin (Stroke Patients Only) Pre-Morbid Rankin Score: No symptoms Modified Rankin: Severe disability     Balance Overall balance assessment: Needs assistance Sitting-balance support: Single extremity supported;Feet supported Sitting balance-Leahy Scale: Zero Sitting balance - Comments: pt with right posterior lean with max assist to support trunk and pt unable to correct to midline Postural control: Posterior lean;Right lateral lean Standing balance support: Bilateral upper extremity supported Standing balance-Leahy Scale: Zero Standing balance comment: bil UE support with RLE blocked in standing, pt weight bearing bil LE                             Pertinent Vitals/Pain Pain Assessment: (CPOT= 0)    Home Living Family/patient expects to be discharged to:: Private residence Living Arrangements: Parent Available Help at Discharge: Family;Available PRN/intermittently Type of Home: House Home Access: Stairs to enter;Ramped entrance  Entrance Stairs-Number of Steps: 4-5 steps to basement apartment, ramp on main floor Home Layout: Two level;Able to live on main level with  bedroom/bathroom Home Equipment: [place] - 2 wheels;Bedside commode;Other (comment) Additional Comments: equipment is mom's but she isn't using. 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 pt does demonstrate shoulder shrug and bicep in response to painful stimuli. pt does not actively use R UE at this time. Pt does have movement noted with yawn.  RUE Coordination: decreased fine motor;decreased gross motor    Lower Extremity Assessment Lower Extremity Assessment: RLE deficits/detail RLE Deficits / Details: pt with clonus throughout session even with over pressure in sitting required grossly 3 min to inhibit response and calm limb, automatic response of withdrawal with hip and knee flexion to nail bed pressure but no movement on command    Cervical / Trunk Assessment Cervical / Trunk Assessment: Normal;Other exceptions Cervical / Trunk Exceptions: skull flap missing left, left eye swollen shut  Communication   Communication: Other (comment)(pt nonverbal throughout session)  Cognition Arousal/Alertness: Lethargic Behavior During Therapy: Flat affect Overall Cognitive Status: Difficult to assess Area of Impairment: Attention;Following commands;Safety/judgement                   Current Attention Level: Focused   Following Commands: Follows one step commands inconsistently Safety/Judgement: Decreased awareness of safety;Decreased awareness of deficits     General Comments: pt does not consistently follow verbal or tactile commands. He will lift LUE when asked to give thumbs up, will at time bend knee or perform action verbally stated. Pt will use  LUE to attempt to find noxious stimuli on right       General Comments      Exercises     Assessment/Plan    PT Assessment Patient needs continued PT services  PT Problem List Decreased strength;Decreased mobility;Decreased safety awareness;Decreased range of motion;Decreased coordination;Decreased activity tolerance;Decreased cognition;Decreased balance;Decreased knowledge of use of DME;Impaired sensation       PT Treatment Interventions Gait training;Therapeutic exercise;Patient/family education;Balance training;Functional mobility training;Neuromuscular re-education;DME instruction;Therapeutic activities;Cognitive remediation    PT Goals (Current goals can be found in the Care Plan section)  Acute Rehab PT Goals Patient Stated Goal: none stated, family agreeable to increased mobility PT Goal Formulation: With family Time For Goal Achievement: 03/08/18 Potential to Achieve Goals: Fair    Frequency Min 4X/week   Barriers to discharge Decreased caregiver support dad and mom work but they will discuss with friends and family arranging additional assist    Co-evaluation PT/OT/SLP Co-Evaluation/Treatment: Yes Reason for Co-Treatment: Complexity of the patient's impairments (multi-system involvement);For patient/therapist safety PT goals addressed during session: Mobility/safety with mobility;Balance         AM-PAC PT "6 Clicks" Daily Activity  Outcome Measure Difficulty turning over in bed (including adjusting bedclothes, sheets and blankets)?: Unable Difficulty moving from lying on back to sitting on the side of the bed? : Unable Difficulty sitting down on and standing up from a chair with arms (e.g., wheelchair, bedside commode, etc,.)?: Unable Help needed moving to and from a bed to chair (including a wheelchair)?: Total Help needed walking in hospital  room?: Total Help needed climbing 3-5 steps with a railing? : Total 6 Click Score: 6    End of Session Equipment  Utilized During Treatment: Gait belt Activity Tolerance: Patient tolerated treatment well Patient left: in chair;with call bell/phone within reach;with chair alarm set;with family/visitor present;with nursing/sitter in room Nurse Communication: Mobility status;Need for lift equipment PT Visit Diagnosis: Other abnormalities of gait and mobility (R26.89);Hemiplegia and hemiparesis;Other symptoms and signs involving the nervous system (R29.898) Hemiplegia - Right/Left: Right Hemiplegia - dominant/non-dominant: Dominant Hemiplegia - caused by: Cerebral infarction    Time: 1483-0735 PT Time Calculation (min) (ACUTE ONLY): 28 min   Charges:   PT Evaluation $PT Eval Moderate Complexity: 1 Mod     PT G Codes:        Elwyn Reach, PT [phone]   [place] 02/22/2018, 12:49 PM

## 2018-02-23 ENCOUNTER — Inpatient Hospital Stay (HOSPITAL_COMMUNITY): Payer: BLUE CROSS/BLUE SHIELD

## 2018-02-23 DIAGNOSIS — I63032 Cerebral infarction due to thrombosis of left carotid artery: Secondary | ICD-10-CM

## 2018-02-23 DIAGNOSIS — Z823 Family history of stroke: Secondary | ICD-10-CM

## 2018-02-23 DIAGNOSIS — R4701 Aphasia: Secondary | ICD-10-CM

## 2018-02-23 DIAGNOSIS — I359 Nonrheumatic aortic valve disorder, unspecified: Secondary | ICD-10-CM

## 2018-02-23 DIAGNOSIS — E7211 Homocystinuria: Secondary | ICD-10-CM

## 2018-02-23 DIAGNOSIS — F17210 Nicotine dependence, cigarettes, uncomplicated: Secondary | ICD-10-CM

## 2018-02-23 DIAGNOSIS — I63512 Cerebral infarction due to unspecified occlusion or stenosis of left middle cerebral artery: Secondary | ICD-10-CM

## 2018-02-23 DIAGNOSIS — E538 Deficiency of other specified B group vitamins: Secondary | ICD-10-CM | POA: Diagnosis present

## 2018-02-23 LAB — SODIUM
Sodium: 161 mmol/L (ref 135–145)
Sodium: 160 mmol/L — ABNORMAL HIGH (ref 135–145)
Sodium: 158 mmol/L — ABNORMAL HIGH (ref 135–145)

## 2018-02-23 LAB — BASIC METABOLIC PANEL
Anion gap: 7 (ref 5–15)
BUN: 12 mg/dL (ref 6–20)
CALCIUM: 8.6 mg/dL — AB (ref 8.9–10.3)
CHLORIDE: 126 mmol/L — AB (ref 98–111)
CO2: 27 mmol/L (ref 22–32)
CREATININE: 0.8 mg/dL (ref 0.61–1.24)
GFR calc non Af Amer: >60 mL/min (ref >60)
GLUCOSE: 113 mg/dL — AB (ref 70–99)
Potassium: 3.3 mmol/L — ABNORMAL LOW (ref 3.5–5.1)
Sodium: 160 mmol/L — ABNORMAL HIGH (ref 135–145)

## 2018-02-23 LAB — PHOSPHORUS: PHOSPHORUS: 3 mg/dL (ref 2.5–4.6)

## 2018-02-23 LAB — PROCALCITONIN: Procalcitonin: <0.1 ng/mL

## 2018-02-23 LAB — MAGNESIUM: Magnesium: 2.1 mg/dL (ref 1.7–2.4)

## 2018-02-23 MED ORDER — FOLIC ACID 1 MG PO TABS
4.0000 mg | ORAL_TABLET | Freq: Every day | ORAL | Status: DC
  Administered 2018-02-24 – 2018-03-01 (×6): 4 mg via ORAL
  Filled 2018-02-23 (×8): qty 4

## 2018-02-23 MED ORDER — VITAMIN B-6 50 MG PO TABS
50.0000 mg | ORAL_TABLET | Freq: Every day | ORAL | Status: DC
  Administered 2018-02-24 – 2018-03-01 (×6): 50 mg via ORAL
  Filled 2018-02-23 (×6): qty 1

## 2018-02-23 MED ORDER — RESOURCE THICKENUP CLEAR PO POWD
ORAL | Status: DC | PRN
  Filled 2018-02-23: qty 125

## 2018-02-23 MED ORDER — ENSURE ENLIVE PO LIQD
237.0000 mL | Freq: Two times a day (BID) | ORAL | Status: DC
  Administered 2018-02-24 – 2018-03-01 (×12): 237 mL via ORAL

## 2018-02-23 MED ORDER — PANTOPRAZOLE SODIUM 40 MG PO TBEC
40.0000 mg | DELAYED_RELEASE_TABLET | Freq: Every day | ORAL | Status: DC
  Administered 2018-02-24 – 2018-03-01 (×6): 40 mg via ORAL
  Filled 2018-02-23 (×6): qty 1

## 2018-02-23 NOTE — Progress Notes (Signed)
STROKE TEAM PROGRESS NOTE   SUBJECTIVE (INTERVAL HISTORY) His dad and other family members are at the bedside.  Pt is sitting in chair and after MBS at bedside. He was able to pass swallow. He is awake and able to follow some peripheral commands. CT repeat showed increased cerebral edema. Na 160.   OBJECTIVE Temp:  [98.4 F (36.9 C)-100.9 F (38.3 C)] 99.6 F (37.6 C) (07/17 1200) Pulse Rate:  [50-103] 72 (07/17 1500) Cardiac Rhythm: Normal sinus rhythm (07/17 1200) Resp:  [14-22] 19 (07/17 1500) BP: (100-164)/(68-96) 143/76 (07/17 1500) SpO2:  [93 %-98 %] 95 % (07/17 1500) Weight:  [182 lb 8.7 oz (82.8 kg)] 182 lb 8.7 oz (82.8 kg) (07/17 0500)  CBC:  Recent Labs  Lab 02/20/18 0547 02/21/18 0539 02/22/18 0516  WBC 19.5* 14.2* 18.3*  NEUTROABS 17.4*  --  15.2*  HGB 15.2 15.1 14.5  HCT 43.0 44.2 42.0  MCV 109.1* 112.5* 112.0*  PLT 174 157 242    Basic Metabolic Panel:  Recent Labs  Lab 02/21/18 1702  02/22/18 0516  02/23/18 0542 02/23/18 1101  NA 157*   < > 157*   < > 160* 160*  K  --   --  3.2*  --  3.3*  --   CL  --   --  121*  --  126*  --   CO2  --   --  27  --  27  --   GLUCOSE  --   --  127*  --  113*  --   BUN  --   --  5*  --  12  --   CREATININE  --   --  0.65  --  0.80  --   CALCIUM  --   --  8.5*  --  8.6*  --   MG 1.8  --  2.0  --  2.1  --   PHOS 2.9  --   --   --  3.0  --    < > = values in this interval not displayed.    Lipid Panel:     Component Value Date/Time   CHOL 101 02/20/2018 0547   TRIG 94 02/22/2018 1745   HDL 34 (L) 02/20/2018 0547   CHOLHDL 3.0 02/20/2018 0547   VLDL 21 02/20/2018 0547   LDLCALC 46 02/20/2018 0547   HgbA1c:  Lab Results  Component Value Date   HGBA1C 4.9 02/20/2018   Urine Drug Screen:     Component Value Date/Time   LABOPIA NONE DETECTED 02/19/2018 1655   COCAINSCRNUR NONE DETECTED 02/19/2018 1655   LABBENZ NONE DETECTED 02/19/2018 1655   AMPHETMU NONE DETECTED 02/19/2018 1655   THCU NONE DETECTED  02/19/2018 1655   LABBARB (A) 02/19/2018 1655    Result not available. Reagent lot number recalled by manufacturer.    Alcohol Level No results found for: [facility]  IMAGING I have personally reviewed the radiological images below and agree with the radiology interpretations.  Ct Angio Head W Or Wo Contrast Ct Angio Neck W Or Wo Contrast Ct Cerebral Perfusion W Contrast 02/19/2018 IMPRESSION:  Large territory left MCA acute infarct. 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 Occlusion left A1 with reconstitution left A2. Occlusion left M1 and M2 segments.   IR Angiogram Dr Earleen Newport 02/19/2018 3:08 PM Findings:  Left proximal cervical ICA dissection/thrombus Left M1 occlusion First pass, TICI 2a,  with emboli in distal M1/M2 Second pass, TICI 2a, with persistent occlusion of M2 branch Third pass, TICI 2b Occlusive left ICA dissection, with patent ACA and maintained cross-flow from right to left Posterior circulation perfuses the left temporal and occipital region Flat panel CT shows SAH/ICH Complications: ICH  Ct Head Wo Contrast 02/19/2018 IMPRESSION:  Left MCA territory infarction with early low-density and swelling but no mass effect or shift at this time. I think most the hyperdensity seen previously relates to contrast staining, much of which is diminishing or resolved. There is some persistent contrast staining in the region of the insula and I could not exclude that there is a small amount of admixed hemorrhage.   Ct Head Wo Contrast 02/19/2018 IMPRESSION:  Large territory acute infarct left MCA territory. Interval development of multiple areas of patchy hemorrhage within the infarct following clot retrieval. No midline shift.   Ct Head Code Stroke Wo Contrast 02/19/2018 IMPRESSION:  1. Hypodensities left frontal lobe and left occipital parietal lobe most  compatible with acute infarct. Possible emboli or watershed infarct. Negative for hemorrhage or mass-effect  2. ASPECTS is 8   MRI / MRA Acute infarct of the entire left MCA territory with mild associated hemorrhage in the left parietal and frontal lobe. Diffuse edema. 2 mm midline shift to the right. No other infarct Occlusion of the left internal carotid artery and entire left middle cerebral artery  Transthoracic Echocardiogram - Left ventricle: The cavity size was normal. Wall thickness was   increased in a pattern of mild LVH. Systolic function was normal.   The estimated ejection fraction was in the range of 55% to 60%.   Wall motion was normal; there were no regional wall motion   abnormalities. Left ventricular diastolic function parameters   were normal. - Aortic valve: There was a small, mobile vegetation on the left   ventricular aspect. Impressions: - There is a small - medium sized mobile mass on the LV aspect of   the aortic valve.   The appearence is c/w vegetation.   Aortic valve endocarditis  VAS [place] LOWER EXTREMITY VENOUS (DVT) There is no DVT or SVT noted in the bilateral lower extremities.   CUS  R: no ICA stenosis. The left ICA appears occluded.  Right vertebral artery flow is antegrade.  Left vertebral artery not insonated secondary to patient's positioning on vent.  Ct Head Wo Contrast 02/21/2018 CLINICAL DATA:  Stroke follow-up EXAM: CT HEAD WITHOUT CONTRAST TECHNIQUE: Contiguous axial images were obtained from the base of the skull through the vertex without intravenous contrast. COMPARISON:  Head CT 02/19/2018 FINDINGS: Brain: There is increased hypodensity throughout the left MCA distribution in the location of the known infarct. Hyperdense material within the infarcted region has also decreased in density, with no increased volume. Mass effect on the left lateral ventricle is increased and there is now rightward midline shift measuring 4 mm at the level of  the foramina of [place]. No hydrocephalus. Right hemisphere and cerebellum are normal. Vascular: Hyperattenuation of the left middle cerebral artery. Skull: The visualized skull base, calvarium and extracranial soft tissues are normal. Sinuses/Orbits: No fluid levels or advanced mucosal thickening of the visualized paranasal sinuses. No mastoid or middle ear effusion. The orbits are normal. IMPRESSION: 1. Progression of cytotoxic edema throughout the left MCA distribution, causing slight mass effect on the left lateral ventricle with 4 mm of rightward midline shift. No hydrocephalus. 2. Decreased density in unchanged distribution of hyperattenuating material in the left  MCA territory, favored to indicate resolving contrast staining. Electronically Signed   By: Ulyses Jarred M.D.   On: 02/21/2018 05:15   Ct Head Wo Contrast  Result Date: 02/23/2018 CLINICAL DATA:  37 y/o  M; decompressive craniectomy. EXAM: CT HEAD WITHOUT CONTRAST TECHNIQUE: Contiguous axial images were obtained from the base of the skull through the vertex without intravenous contrast. COMPARISON:  02/21/2018 CT head.  02/20/2018 MRI of the head. FINDINGS: Brain: Severe progressed edema throughout the left MCA infarction. Further decrease in attenuation of hyperdense material in the center of the infarct. Mass effect from edema has increased with mild herniation of the hemisphere through the craniectomy, partial effacement of left lateral ventricle, and 5 mm of left-to-right midline shift. There is slight interval dilatation of the right lateral ventricle which may represent developing entrapment. There is a small volume of acute extra-axial hemorrhage posterior cranial margin compatible postoperative change. Vascular: Density within left internal carotid artery and MCA compatible with thrombosis. Skull: Interval left lateral craniectomy with extensive edema throughout the left scalp, periorbital compartment, and face, several small foci of air,  and a small air-fluid level compatible postoperative changes. Sinuses/Orbits: Mild mucosal thickening within the sphenoid sinus and the right maxillary sinus. Normal aeration of mastoid air cells. Orbital compartments are unremarkable. Other: None. IMPRESSION: 1. Interval postsurgical changes related to left hemi craniectomy. 2. Increased edema and mass effect associated with the left MCA infarct with mild herniation of the left hemisphere through the craniectomy, further effacement of left lateral ventricle, and 5 mm left-to-right midline shift. 3. Slight interval increased dilatation of right lateral ventricle may represent entrapment, attention at follow-up recommended. Electronically Signed   By: Kristine Garbe M.D.   On: 02/23/2018 06:05   Dg Chest Port 1 View  Result Date: 02/23/2018 CLINICAL DATA:  Respiratory failure EXAM: PORTABLE CHEST 1 VIEW COMPARISON:  02/21/2018 FINDINGS: Left central line remains in place, unchanged. Interval removal of NG tube and endotracheal tube. Heart is upper limits normal in size. Platelike atelectasis in the right lower lung. Left lung clear. No effusions. IMPRESSION: Continued right lower lung atelectasis. Electronically Signed   By: Rolm Baptise M.D.   On: 02/23/2018 10:46     PHYSICAL EXAM Temp:  [98.4 F (36.9 C)-100.9 F (38.3 C)] 99.6 F (37.6 C) (07/17 1200) Pulse Rate:  [50-103] 72 (07/17 1500) Resp:  [14-22] 19 (07/17 1500) BP: (100-164)/(68-96) 143/76 (07/17 1500) SpO2:  [93 %-98 %] 95 % (07/17 1500) Weight:  [182 lb 8.7 oz (82.8 kg)] 182 lb 8.7 oz (82.8 kg) (07/17 0500)  General - Well nourished, well developed, lethargic.  Ophthalmologic - fundi not visualized due to noncooperation and right eyelid swollen.  Cardiovascular - Regular rate and rhythm.  Neuro - sitting in chair. Right eye open but left eyelid swollen not able to open left eye. Right pupil brisk light reflex, but not able to see left pupil. Left gaze preference,  barely cross midline. Able to follow all central commands and some peripheral command on the left. Not blinking to visual threat on the right. Right facial droop. LUE and LLE spontaneous movement against gravity. RUE 1/5 with increased tone and RLE 2/5 on pain stimulation. DTR 1+ and right babinski positive. Sensation, coordination and gait not tested.   ASSESSMENT/PLAN Xavier Villegas is a 37 y.o. male with no significant past medical history other than tobacco use presenting with right-sided weakness and aphasia. He did not receive IV t-PA due to late presentation.  Attempted  thrombectomy with subsequent left ICA dissection and possible ICH.  Stroke: large left MCA territory infarct due to left ICA and MCA occlusion, etiology unclear, could be due to ICA dissection or aortic valve vegetation shown on TTE   Resultant  Intubated, right hemiplegia, aphasia  CT head - Large territory acute infarct left MCA territory.  IR - occlusive left ICA but TICI2b left MCA  MRI head - large left MCA infarct with small ICH/SAH hemorrhagic conversion  MRA head - left ICA and MCA remain occluded  CTA H&N - Acute dissection left internal carotid artery with intraluminal thrombus  LE Venous Dopplers - no DVT  Carotid Doppler - left ICA occluded  2D Echo - EF 55-60% and aortic valve mass vs. vegetation  consider TEE once more stable  Hypercoagulable work up negative except high homocysteine at 181.7. MRHFR analysis pending  LDL - 46  HgbA1c - 4.9  VTE prophylaxis - subq heparin  No antithrombotic prior to admission, now on ASA.   Ongoing aggressive stroke risk factor management  Therapy recommendations:  CIR  Disposition:  Pending  Cerebral edema  MRA large left MCA infarct with 48mm midline shift 02/20/18  Repeat CT showed mass effect with 59mm midline shift 02/21/18  CT repeat showed increased mass effect to 66mm midline shift 02/23/18  On 3% saline  Na goal 150-160  Na  Q6h  S/p [facility] - mental status much improved  CT repeat pending  Aortic valve mass vs. vegetation  TTE concerning for AV vegetation vs. mass  Blood culture NGTD  Will repeat blood culture  WBC 15.4->19.5->14.2->18.3  Tmax 100.6->97.2->100.9->afebrile  Cardiology consult appreciated  Consider TEE once stable  ID recs appreciated  Severe hyperhomocysteinemia - associated with low FA, B12 and smoking  Homocysteine 181.7  Low FA level - 1.5 - on FA supplement  Low normal B12 level - 224 -supplementation ordered  Smoker   On supplement of FA and B12 and B6  Check B6 level in am  MRHFR mutation pending  Left ICA dissection ??  Girlfriend stated that he bumped his head to window 3 weeks ago  Left ICA occlusion   Tobacco abuse  Current smoker  Smoking cessation counseling will be provided  Other Stroke Risk Factors  ETOH use, advised to drink no more than 1 alcoholic beverage per day.  Family hx of stroke (grandparents)  Other [facility] day # 4  This patient is critically ill due to large left MCA infarct with hemorrhagic conversion, endocarditis, smoker and at significant risk of neurological worsening, death form recurrent stroke, hemorrhage, heart failure, seizure, brain herniation. This patient's care requires constant monitoring of vital signs, hemodynamics, respiratory and cardiac monitoring, review of multiple databases, neurological assessment, discussion with family, other specialists and medical decision making of high complexity. I spent 40 minutes of neurocritical care time in the care of this patient. I had long discussion with dad and brother at bedside, updated pt current condition, treatment plan and potential prognosis. They expressed understanding and appreciation. I also discussed with Dr. Pearline Cables from [facility].   Rosalin Hawking, MD PhD Stroke Neurology 02/23/2018 3:47 PM  To contact Stroke Continuity provider, please refer to  [email or web address] After hours, contact General Neurology

## 2018-02-23 NOTE — Progress Notes (Signed)
MD called and notified of Sodium level 160 and of follow up CT of head results this morning. Per MD, continue to hold 3% hypertonic saline. Patient vital signs stable and continues to follow commands, neuro intact. RN will continue to monitor.

## 2018-02-23 NOTE — Progress Notes (Signed)
CRITICAL VALUE ALERT  Critical Value:  Sodium 161  Date & Time Notied:  02/23/2018 @ 9597  Provider Notified: Lindzend MD  Orders Received/Actions taken: hold 3% hypertonic saline until next lab draw and re-assess
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
## 2018-02-23 NOTE — Progress Notes (Signed)
Physical Therapy Treatment Patient Details Name: Xavier Villegas MRN: [id number] DOB: 20-Sep-1980 Today's Date: 02/23/2018    History of Present Illness 37 yo found down by girlfriend 7/13 with aphasia and right weakness with Lt MCA infarct with left ICA dissection s/p IR with reocclusion and hemorrhagic transformation with SAH, hemicraniectomy for decompression 7/15, pt with aortic valve vegetation. Pt self-extubated 7/16. No significant PMHx    PT Comments    Continuing work on functional mobility and activity tolerance;  Most notable is progress with cognition, following commands -- able to follow simple commands related to ADLs and movement with less need for demonstrational cues than last session; Noting [name] initiated sit to stand and small steps to recliner-- still with difficulty organizing for weight shifts for stepping, and requirirng heavy R knee block fo rstability  Follow Up Recommendations  CIR;Supervision/Assistance - 24 hour     Equipment Recommendations  Wheelchair cushion (measurements PT);Wheelchair (measurements PT);3in1 (PT)    Recommendations for Other Services       Precautions / Restrictions Precautions Precautions: Fall Precaution Comments: bone flap in L abdomen/ L hand mitten    Mobility  Bed Mobility Overal bed mobility: Needs Assistance Bed Mobility: Rolling;Sidelying to Sit Rolling: +2 for physical assistance;Mod assist Sidelying to sit: HOB elevated;Max assist;+2 for physical assistance       General bed mobility comments: cues for sequence with assist to bend knee and roll toward left. pt able to bring LLE off of bed with multimodal cues but requires assist for RLE and trunk elevation  Transfers Overall transfer level: Needs assistance Equipment used: 2 person hand held assist(with bed pad to cradle hips and gait belt fo rsafety) Transfers: Sit to/from [facility] Sit to Stand: Mod assist;+2 physical assistance;+2  safety/equipment;From elevated surface Stand pivot transfers: Max assist;+2 physical assistance;+2 safety/equipment;From elevated surface       General transfer comment: Pt initiated with L side . pt requires blocking of R side and input at the R hip as pt with tendency for left lean with right hip lateral protrusion . Pt with input at the sacrum for trunk activation for static standing. Pt able to step with L LE toward chair with R side completely supported by therapist. Bil UE supported with sit to stand x 2 from bed; Able to power up to simple stand with the strength of LLE; Heavy mod assist of 2  and R knee block for steadiness; short pivotal steps bed to chair with noted pt initiation, Max assist due to Rkne buckle  Ambulation/Gait                 Stairs             Wheelchair Mobility    Modified Rankin (Stroke Patients Only) Modified Rankin (Stroke Patients Only) Pre-Morbid Rankin Score: No symptoms Modified Rankin: Severe disability     Balance Overall balance assessment: Needs assistance Sitting-balance support: Single extremity supported;Feet supported Sitting balance-Leahy Scale: Poor Sitting balance - Comments: EOB with mod assist; noted LUE tends to push to R side when palm is down on bed, resembling pushing Postural control: Right lateral lean Standing balance support: Bilateral upper extremity supported Standing balance-Leahy Scale: Zero Standing balance comment: bil UE support with RLE blocked in standing, pt weight bearing bil LE                            Cognition Arousal/Alertness: Lethargic Behavior During Therapy: Flat  affect Overall Cognitive Status: Difficult to assess Area of Impairment: Attention;Following commands;Safety/judgement                   Current Attention Level: (approaching Sustained)   Following Commands: Follows one step commands consistently;Follows one step commands with increased time(simple commands;  with demo cues) Safety/Judgement: Decreased awareness of safety;Decreased awareness of deficits     General Comments: Opens eyes to his name; made attempts at vocalization when asked, "what is your name?"; Followed majority of simple commands related to ADLs with consistent need fo rdemo cues      Exercises      General Comments General comments (skin integrity, edema, etc.): HR range 60-88 during session; BP 164/99 in chair immediately after transfer; BP 147/81 after 3 minutes seated rest; O2 sats greater tahn or equal to 94% on Room Air      Pertinent Vitals/Pain Pain Assessment: Faces Faces Pain Scale: Hurts a little bit Pain Location: Noted a few winces/grimaces with moving, wiping face Pain Descriptors / Indicators: Grimacing    Home Living                      Prior Function            PT Goals (current goals can now be found in the care plan section) Acute Rehab PT Goals Patient Stated Goal: Unable to state PT Goal Formulation: With family Time For Goal Achievement: 03/08/18 Potential to Achieve Goals: Fair Progress towards PT goals: Progressing toward goals    Frequency    Min 4X/week      PT Plan Current plan remains appropriate    Co-evaluation PT/OT/SLP Co-Evaluation/Treatment: Yes Reason for Co-Treatment: Complexity of the patient's impairments (multi-system involvement);Necessary to address cognition/behavior during functional activity;For patient/therapist safety PT goals addressed during session: Mobility/safety with mobility        AM-PAC PT "6 Clicks" Daily Activity  Outcome Measure  Difficulty turning over in bed (including adjusting bedclothes, sheets and blankets)?: Unable Difficulty moving from lying on back to sitting on the side of the bed? : Unable Difficulty sitting down on and standing up from a chair with arms (e.g., wheelchair, bedside commode, etc,.)?: Unable Help needed moving to and from a bed to chair (including a  wheelchair)?: Total Help needed walking in hospital room?: Total Help needed climbing 3-5 steps with a railing? : Total 6 Click Score: 6    End of Session Equipment Utilized During Treatment: Gait belt Activity Tolerance: Patient tolerated treatment well Patient left: in chair;with call bell/phone within reach;with chair alarm set Nurse Communication: Mobility status;Need for lift equipment PT Visit Diagnosis: Other abnormalities of gait and mobility (R26.89);Hemiplegia and hemiparesis;Other symptoms and signs involving the nervous system (R29.898) Hemiplegia - Right/Left: Right Hemiplegia - dominant/non-dominant: Dominant Hemiplegia - caused by: Cerebral infarction     Time: 1610-9604 PT Time Calculation (min) (ACUTE ONLY): 27 min  Charges:  $Therapeutic Activity: 8-22 mins                    G Codes:       Roney Marion, PT  Acute Rehabilitation Services Pager [phone] Office [place] 02/23/2018, 9:14 AM

## 2018-02-23 NOTE — Progress Notes (Signed)
Patient ID: Xavier Villegas, male   DOB: February 02, 1981, 37 y.o.   MRN: [id number] Late entry note patient continues to make some improvements. Will follow some simple commands baseline right hemiplegia incisions clean dry and intact.CT scan shows continued midline shift 4-5 mm however craniectomy giving hemisphere some room to decompressed.  Continue plan per neurology.

## 2018-02-23 NOTE — Progress Notes (Signed)
Nutrition Follow-up  DOCUMENTATION CODES:   Not applicable  INTERVENTION:   Ensure Enlive po BID, each supplement provides 350 kcal and 20 grams of protein Magic cup TID with meals, each supplement provides 290 kcal and 9 grams of protein    NUTRITION DIAGNOSIS:   Inadequate oral intake related to dysphagia as evidenced by (altered diet ). Ongoing.   GOAL:   Patient will meet greater than or equal to 90% of their needs Progressing.   MONITOR:   PO intake, Supplement acceptance, Diet advancement  ASSESSMENT:   Pt admitted with left MCA stroke with Left ICA dissection with proximal left M1/MCA occlusion where he was taken for thrombectomy and post procedure CT showed SAH and hemorrhagic transformation.  Pt discussed during ICU rounds and with RN.   7/15 s/p emergent craniectomy with bone flap in L quadrant  7/16 self-extubated 7/17 s/p FEES started on dysphagia 1 with nectar thickened liquids, pt has received no meals yet  Pt remains aphasic, CT worse today however per RN symptoms better.  ID recommend TEE due to possible vegetation on valve    Medications reviewed and include: folic acid, Vitamin B 6 and 12 Off 3% with elevated Na  Labs reviewed: Na 160 (H), K+ 3.3 (L)  Diet Order:   Diet Order           DIET - DYS 1 Room service appropriate? Yes; Fluid consistency: Nectar Thick  Diet effective now          EDUCATION NEEDS:   No education needs have been identified at this time  Skin:  Skin Assessment: Reviewed RN Assessment  Last BM:  unknown  Height:   Ht Readings from Last 1 Encounters:  02/21/18 6' (1.829 m)    Weight:   Wt Readings from Last 1 Encounters:  02/23/18 182 lb 8.7 oz (82.8 kg)    Ideal Body Weight:  80.9 kg  BMI:  Body mass index is 24.76 kg/m.  Estimated Nutritional Needs:   Kcal:  2300-2500  Protein:  105-130 grams  Fluid:  > 2 L/day  Maylon Peppers RD, LDN, CNSC [phone] Pager [phone] After Hours Pager

## 2018-02-23 NOTE — Procedures (Signed)
Objective Swallowing Evaluation: Type of Study: FEES-Fiberoptic Endoscopic Evaluation of Swallow   Patient Details  Name: Xavier Villegas MRN: [id number] Date of Birth: 1981-07-28  Today's Date: 02/23/2018 Time: SLP Start Time (ACUTE ONLY): 1000 -SLP Stop Time (ACUTE ONLY): 1045  SLP Time Calculation (min) (ACUTE ONLY): 45 min   Past Medical History:  Past Medical History:  Diagnosis Date  . Tobacco abuse    HPI:  This is a 37 year old who presented with a right hemiplegia who was found to have a left carotid dissection as well as an MCA thrombosis.  A thrombectomy was performed however this appears to have reclosed on a follow-up MRI.  He has evolved a large left MCA territory infarct with hemorrhagic transformation. Intubated from 7/13-7/16, self extubated.    No data recorded   Assessment / Plan / Recommendation  CHL IP CLINICAL IMPRESSIONS 02/23/2018  Clinical Impression Pt demonstrates a mild to moderate orpharyngeal dysphagia, oral phase fluctuates with arousal and responsiveness; initial bolus formation slightly sluggish, delayed, suspect decreased lingual/buccal control on left. As study prgressed pt seemed more timely. Pharyngeal impairment includes mild paresis of left lateral pharyngeal wall and let artenoid; bowed vocal folds, bilateral excresence on posterior vocal folds at ETT contact site. Function characterized by mild delay in swallow initiation, incomplete glottic closure and mild left lateral channel residue. Pt tolerates purees and nectar thick liquids well, clearing residue with a second swallow as needed. Thin liquids are aspirated before/during the swallow with sensation. Recommend initiating a dys 1 (puree) diet and nectar thick liquids with expectation to progress solids if arousal is consistent and pt demonstrates adequate mastication. Trial thin liquids with expectation of adequate sensation of aspiration.   SLP Visit Diagnosis Dysphagia, oropharyngeal phase  (R13.12)  Attention and concentration deficit following --  Frontal lobe and executive function deficit following --  Impact on safety and function Moderate aspiration risk      CHL IP TREATMENT RECOMMENDATION 02/23/2018  Treatment Recommendations Therapy as outlined in treatment plan below     Prognosis 02/23/2018  Prognosis for Safe Diet Advancement Good  Barriers to Reach Goals --  Barriers/Prognosis Comment --    CHL IP DIET RECOMMENDATION 02/23/2018  SLP Diet Recommendations Dysphagia 1 (Puree) solids;Nectar thick liquid  Liquid Administration via Cup;Straw  Medication Administration Whole meds with puree  Compensations Slow rate;Small sips/bites;Multiple dry swallows after each bite/sip  Postural Changes Seated upright at 90 degrees      CHL IP OTHER RECOMMENDATIONS 02/23/2018  Recommended Consults --  Oral Care Recommendations Oral care BID  Other Recommendations Order thickener from pharmacy      CHL IP FOLLOW UP RECOMMENDATIONS 02/23/2018  Follow up Recommendations Inpatient Rehab      CHL IP FREQUENCY AND DURATION 02/23/2018  Speech Therapy Frequency (ACUTE ONLY) min 2x/week  Treatment Duration 2 weeks           CHL IP ORAL PHASE 02/23/2018  Oral Phase Impaired  Oral - Pudding Teaspoon --  Oral - Pudding Cup --  Oral - Honey Teaspoon --  Oral - Honey Cup --  Oral - Nectar Teaspoon Decreased bolus cohesion  Oral - Nectar Cup Decreased bolus cohesion;Delayed oral transit  Oral - Nectar Straw Decreased bolus cohesion;Delayed oral transit  Oral - Thin Teaspoon --  Oral - Thin Cup Decreased bolus cohesion  Oral - Thin Straw --  Oral - Puree Delayed oral transit;Decreased bolus cohesion  Oral - Mech Soft --  Oral - Regular Decreased bolus cohesion;Delayed oral transit;Impaired  mastication  Oral - Multi-Consistency --  Oral - Pill --  Oral Phase - Comment --    CHL IP PHARYNGEAL PHASE 02/23/2018  Pharyngeal Phase Impaired  Pharyngeal- Pudding Teaspoon --   Pharyngeal --  Pharyngeal- Pudding Cup --  Pharyngeal --  Pharyngeal- Honey Teaspoon --  Pharyngeal --  Pharyngeal- Honey Cup --  Pharyngeal --  Pharyngeal- Nectar Teaspoon Delayed swallow initiation-pyriform sinuses;Reduced pharyngeal peristalsis;Lateral channel residue;Pharyngeal residue - pyriform  Pharyngeal --  Pharyngeal- Nectar Cup Delayed swallow initiation-pyriform sinuses;Reduced pharyngeal peristalsis;Lateral channel residue;Pharyngeal residue - pyriform  Pharyngeal --  Pharyngeal- Nectar Straw Delayed swallow initiation-pyriform sinuses;Reduced pharyngeal peristalsis;Lateral channel residue;Pharyngeal residue - pyriform  Pharyngeal --  Pharyngeal- Thin Teaspoon --  Pharyngeal --  Pharyngeal- Thin Cup Delayed swallow initiation-pyriform sinuses;Penetration/Aspiration before swallow;Penetration/Aspiration during swallow  Pharyngeal --  Pharyngeal- Thin Straw --  Pharyngeal --  Pharyngeal- Puree WFL  Pharyngeal --  Pharyngeal- Mechanical Soft --  Pharyngeal --  Pharyngeal- Regular WFL  Pharyngeal --  Pharyngeal- Multi-consistency --  Pharyngeal --  Pharyngeal- Pill --  Pharyngeal --  Pharyngeal Comment --     No flowsheet data found.  No flowsheet data found.  Mead Slane, Katherene Ponto 02/23/2018, 12:28 PM

## 2018-02-23 NOTE — Progress Notes (Signed)
Occupational Therapy Treatment Patient Details Name: Xavier Villegas MRN: [id number] DOB: 25-May-1981 Today's Date: 02/23/2018    History of present illness 37 yo found down by girlfriend 7/13 with aphasia and right weakness with Lt MCA infarct with left ICA dissection s/p IR with reocclusion and hemorrhagic transformation with SAH, hemicraniectomy for decompression 7/15, pt with aortic valve vegetation. Pt self-extubated 7/16. No significant PMHx   OT comments  Pt more alert and engaged in session today compared to 02/22/18. Pt will drift off to sleep quickly with quiet environment. Recommending RESTING HAND splint from [employer] for R hand ( size large). Splint schedule for night time only and elevated on pillow for edema management.    Follow Up Recommendations  CIR    Equipment Recommendations  Other (comment)    Recommendations for Other Services Rehab consult    Precautions / Restrictions Precautions Precautions: Fall Precaution Comments: bone flap in L abdomen/ L hand mitten       Mobility Bed Mobility Overal bed mobility: Needs Assistance Bed Mobility: Rolling;Sidelying to Sit Rolling: +2 for physical assistance;Mod assist Sidelying to sit: HOB elevated;Max assist;+2 for physical assistance       General bed mobility comments: cues for sequence with assist to bend knee and roll toward left. pt able to bring LLE off of bed with multimodal cues but requires assist for RLE and trunk elevation  Transfers Overall transfer level: Needs assistance Equipment used: 2 person hand held assist(with bed pad to cradle hips and gait belt fo rsafety) Transfers: Sit to/from [facility] Sit to Stand: Mod assist;+2 physical assistance;+2 safety/equipment;From elevated surface Stand pivot transfers: Max assist;+2 physical assistance;+2 safety/equipment;From elevated surface       General transfer comment: Pt initiated with L side . pt requires blocking of R side  and input at the R hip as pt with tendency for left lean with right hip lateral protrusion . Pt with input at the sacrum for trunk activation for static standing. Pt able to step with L LE toward chair with R side completely supported by therapist. Bil UE supported with sit to stand x 2 from bed; Able to power up to simple stand with the strength of LLE; Heavy mod assist of 2  and R knee block for steadiness; short pivotal steps bed to chair with noted pt initiation, Max assist due to R knee buckle    Balance Overall balance assessment: Needs assistance Sitting-balance support: Single extremity supported;Feet supported Sitting balance-Leahy Scale: Poor Sitting balance - Comments: EOB with mod assist; noted LUE tends to push to R side when palm is down on bed, resembling pushing Postural control: Right lateral lean Standing balance support: Bilateral upper extremity supported Standing balance-Leahy Scale: Zero Standing balance comment: bil UE support with RLE blocked in standing, pt weight bearing bil LE                           ADL either performed or assessed with clinical judgement   ADL Overall ADL's : Needs assistance/impaired Eating/Feeding: NPO   Grooming: Moderate assistance Grooming Details (indicate cue type and reason): pt with hand over hand (A) on the right hand but following command "open the tooth paste" pt opening with L hand and able to unscrew top. pt needed (A) from therapist to place tooth brush in R hand and hand over hand to place tooth paste on brush due to overshooting. pt was initiating and following command. pt visually attending  to the brush and attempting to reach for the brush but just overshooting the object.                                General ADL Comments: pt completed OOB the chair this session. pt did wash face on command and pay special attention to wiping around L eye without cues. pt needed cues to attempt lips with hand over hand  (A) to help with dryness. pt attempting a smile in response to therapist engagement in session. pt completed oral care in the chair     Vision       Perception     Praxis      Cognition Arousal/Alertness: Lethargic Behavior During Therapy: Flat affect Overall Cognitive Status: Difficult to assess Area of Impairment: Following commands                   Current Attention Level: (approaching Sustained)   Following Commands: Follows one step commands with increased time Safety/Judgement: Decreased awareness of safety;Decreased awareness of deficits     General Comments: pt opening eyes to name call. pt following commands with auditory cues only this session . pt visually attending to therapist with R eye in central vision this session. pt attempting to make sound with "what is your name" pt making a face as if frustrated by inability to answer.         Exercises     Shoulder Instructions       General Comments See vitals charted by PT [facility]    Pertinent Vitals/ Pain       Pain Assessment: Faces Faces Pain Scale: Hurts a little bit Pain Location: Noted a few winces/grimaces with moving, wiping face Pain Descriptors / Indicators: Grimacing  Home Living                                          Prior Functioning/Environment              Frequency  Min 3X/week        Progress Toward Goals  OT Goals(current goals can now be found in the care plan section)  Progress towards OT goals: Progressing toward goals  Acute Rehab OT Goals Patient Stated Goal: Unable to state OT Goal Formulation: With family Time For Goal Achievement: 03/08/18 Potential to Achieve Goals: Good ADL Goals Pt Will Perform Grooming: with mod assist;sitting Pt Will Transfer to Toilet: with +2 assist;squat pivot transfer;with mod assist;bedside commode Pt/caregiver will Perform Home Exercise Program: Increased ROM;Increased strength;Right Upper extremity;With  minimal assist Additional ADL Goal #1: pt will locate adl / food utensils in the R visual field min cues  Plan Discharge plan remains appropriate    Co-evaluation    PT/OT/SLP Co-Evaluation/Treatment: Yes Reason for Co-Treatment: Complexity of the patient's impairments (multi-system involvement);Necessary to address cognition/behavior during functional activity;For patient/therapist safety;To address functional/ADL transfers PT goals addressed during session: Mobility/safety with mobility OT goals addressed during session: ADL's and self-care;Strengthening/ROM;Proper use of Adaptive equipment and DME      AM-PAC PT "6 Clicks" Daily Activity     Outcome Measure   Help from another person eating meals?: Total Help from another person taking care of personal grooming?: Total Help from another person toileting, which includes using toliet, bedpan, or urinal?: Total Help from another person bathing (including  washing, rinsing, drying)?: Total Help from another person to put on and taking off regular upper body clothing?: Total Help from another person to put on and taking off regular lower body clothing?: Total 6 Click Score: 6    End of Session Equipment Utilized During Treatment: Gait belt  OT Visit Diagnosis: Unsteadiness on feet (R26.81);Other symptoms and signs involving cognitive function;Hemiplegia and hemiparesis Hemiplegia - Right/Left: Right Hemiplegia - dominant/non-dominant: Dominant Hemiplegia - caused by: Other Nontraumatic intracranial hemorrhage   Activity Tolerance Patient tolerated treatment well   Patient Left in chair;with call bell/phone within reach;with chair alarm set;with family/visitor present;with restraints reapplied   Nurse Communication Mobility status;Need for lift equipment;Precautions        Time: 6629-4765 OT Time Calculation (min): 31 min  Charges: OT General Charges $OT Visit: 1 Visit OT Treatments $Self Care/Home Management : 8-22  mins   Jeri Modena   OTR/L Pager: [phone] Office: [phone] .    Parke Poisson B 02/23/2018, 10:23 AM

## 2018-02-23 NOTE — Consult Note (Signed)
[facility]       Reason for Consult: possible vegetation on aortic valve   Referring Physician: Dr. Erlinda Hong  Active Problems:   Stroke (cerebrum) [facility])   Acute respiratory insufficiency   Aortic valve mass   Cerebral edema (HCC)   Smoker   . aspirin  325 mg Oral Daily   Or  . aspirin  300 mg Rectal Daily  . chlorhexidine  15 mL Mouth Rinse BID  . Chlorhexidine Gluconate Cloth  6 each Topical Q0600  . folic acid  1 mg Oral Daily  . mouth rinse  15 mL Mouth Rinse q12n4p  . mupirocin ointment  1 application Nasal BID  . vitamin B-12  1,000 mcg Oral Daily    Recommendations: Repeat blood cultures TEE when able, to confirm  Serology for Bartonella, coxiella, antiphospholipids  Assessment: He has a possible small mobile vegetation on the aortic valve.  Will need TEE to confirm since he otherwise is no symptomatic from an infectious standpoint.     Antibiotics: none  HPI: Xavier Villegas is a 37 y.o. male with no significant PMH presented on 7/13 with a stroke with an acute infarct of the entire left MCA territory.  On echo, he was noted to have a small vegetation on the aortic valve.  He has had no fevers, no positive blood cultures.  His HIV is negative.  Patient does not participate in the history.     Review of Systems:  Unable to be assessed due to aphasia  Past Medical History:  Diagnosis Date  . Tobacco abuse     Social History   Tobacco Use  . Smoking status: Current Every Day Smoker    Packs/day: 0.50    Types: Cigarettes  . Smokeless tobacco: Never Used  Substance Use Topics  . Alcohol use: Yes    Alcohol/week: 8.4 oz    Types: 14 Cans of beer per week  . Drug use: Never    Family History  Problem Relation Age of Onset  . Stroke Maternal Grandmother   . Stroke Maternal Grandfather     No Known Allergies  Physical Exam: Constitutional: in no apparent distress  Vitals:   02/23/18 1400 02/23/18 1500  BP:  135/68 (!) 143/76  Pulse: (!) 103 72  Resp: (!) 22 19  Temp:    SpO2: 96% 95%   EYES: anicteric ENMT: left hemicranectomy with edema, left eye edema Cardiovascular: Cor RRR Respiratory: CTA B; normal respiratory effort GI: Bowel sounds are normal, liver is not enlarged, spleen is not enlarged Musculoskeletal: no pedal edema noted Skin: negatives: no rash Hematologic: no cervical lad  Lab Results  Component Value Date   WBC 18.3 (H) 02/22/2018   HGB 14.5 02/22/2018   HCT 42.0 02/22/2018   MCV 112.0 (H) 02/22/2018   PLT 197 02/22/2018    Lab Results  Component Value Date   CREATININE 0.80 02/23/2018   BUN 12 02/23/2018   NA 160 (H) 02/23/2018   K 3.3 (L) 02/23/2018   CL 126 (H) 02/23/2018   CO2 27 02/23/2018    Lab Results  Component Value Date   ALT 33 02/19/2018   AST 28 02/19/2018   ALKPHOS 96 02/19/2018     Microbiology: Recent Results (from the past 240 hour(s))  MRSA PCR Screening     Status: Abnormal   Collection Time: 02/19/18  4:00 PM  Result Value Ref Range Status   MRSA by PCR POSITIVE (A) NEGATIVE  Final    Comment:        The GeneXpert MRSA Assay (FDA approved for NASAL specimens only), is one component of a comprehensive MRSA colonization surveillance program. It is not intended to diagnose MRSA infection nor to guide or monitor treatment for MRSA infections. RESULT CALLED TO, READ BACK BY AND VERIFIED WITH: S.MAYES RN AT 1752 02/19/18 BY A.DAVIS Performed at [facility], [address], [place], [place] [address]   Culture, blood (Routine X 2) w Reflex to ID Panel     Status: None (Preliminary result)   Collection Time: 02/20/18  4:56 PM  Result Value Ref Range Status   Specimen Description BLOOD CENTRAL LINE  Final   Special Requests   Final    BOTTLES DRAWN AEROBIC AND ANAEROBIC Blood Culture adequate volume   Culture   Final    NO GROWTH 3 DAYS Performed at [facility], [address] [place], [place] [address]     Report Status PENDING  Incomplete  Culture, blood (Routine X 2) w Reflex to ID Panel     Status: None (Preliminary result)   Collection Time: 02/20/18  5:20 PM  Result Value Ref Range Status   Specimen Description BLOOD CENTRAL LINE  Final   Special Requests   Final    BOTTLES DRAWN AEROBIC AND ANAEROBIC Blood Culture adequate volume   Culture   Final    NO GROWTH 3 DAYS Performed at [facility], [address], [place], [place] [address]    Report Status PENDING  Incomplete    Thayer Headings, [facility] [place] [email or web address] [phone] pager  [phone] cell 02/23/2018, 3:16 PM

## 2018-02-23 NOTE — Progress Notes (Signed)
Referring Physician(s): CODE STROKE  Supervising Physician: Corrie Mckusick  Patient Status:  [facility] - In-pt  Chief Complaint: None  Subjective:  Left MCA ELVO s/p thrombectomy 02/19/2018 with Dr. Earleen Newport. He is s/p left hemicraniectomy 02/21/2018. Patient awake and alert sitting in chair. Accompanied by 3 family members at bedside. Left eyelid swollen shut and unable to open. Moves head/right eye to follow voice. Has spontaneous movement of left extremities, right extremities withdraw from pain. Right groin incision c/d/i.  CT head 02/23/2018: 1. Interval postsurgical changes related to left hemi craniectomy. 2. Increased edema and mass effect associated with the left MCA infarct with mild herniation of the left hemisphere through the craniectomy, further effacement of left lateral ventricle, and 5 mm left-to-right midline shift. 3. Slight interval increased dilatation of right lateral ventricle may represent entrapment, attention at follow-up recommended.   Allergies: Patient has no known allergies.  Medications: Prior to Admission medications   Not on File     Vital Signs: BP 136/82   Pulse (!) 58   Temp 99.5 F (37.5 C) (Oral)   Resp 15   Ht 6' (1.829 m)   Wt 182 lb 8.7 oz (82.8 kg)   SpO2 96%   BMI 24.76 kg/m   Physical Exam  Constitutional: He appears well-developed and well-nourished. No distress.  Cardiovascular: Normal rate, regular rhythm and normal heart sounds.  No murmur heard. Pulmonary/Chest: Effort normal and breath sounds normal. No respiratory distress. He has no wheezes.  Neurological:  Awake and alert, is able to move head/right eye to voice. He is grossly aphasic. PERRL on right, left eye swollen and not able to open. Left gaze preference but able to move right eye to voice. Visual fields not assessed. Right facial droop noted. Demonstrates spontaneous movements of left extremities, right extremities withdraw from pain. Pronator drift not  assessed. Fine motor and coordination not assessed. Gait not assessed. Romberg not assessed. Heel to toe not assessed. Distal pulses 1+ bilaterally.  Skin: Skin is warm and dry.  Right groin incision soft without active bleeding or hematoma.  Nursing note and vitals reviewed.   Imaging: Ct Angio Head W Or Wo Contrast  Result Date: 02/19/2018 CLINICAL DATA:  Stroke.  Right-sided weakness aphasia EXAM: CT ANGIOGRAPHY HEAD AND NECK CT PERFUSION BRAIN TECHNIQUE: Multidetector CT imaging of the head and neck was performed using the standard protocol during bolus administration of intravenous contrast. Multiplanar CT image reconstructions and MIPs were obtained to evaluate the vascular anatomy. Carotid stenosis measurements (when applicable) are obtained utilizing NASCET criteria, using the distal internal carotid diameter as the denominator. Multiphase CT imaging of the brain was performed following IV bolus contrast injection. Subsequent parametric perfusion maps were calculated using RAPID software. CONTRAST:  31mL ISOVUE-370 IOPAMIDOL (ISOVUE-370) INJECTION 76% COMPARISON:  CT head 02/19/2018 FINDINGS: CTA NECK FINDINGS Aortic arch: Normal aortic arch without atherosclerotic disease or aneurysm. Proximal great vessels normal Right carotid system: Normal right carotid. No evidence of atherosclerotic disease dissection or stenosis Left carotid system: Irregularity and stenosis of the proximal left internal carotid artery compatible with dissection. No significant calcification. Intraluminal filling defect compatible with thrombus. Left internal carotid artery small in caliber but patent to the cavernous segment. There is occlusion of the supraclinoid internal carotid artery on the left. Vertebral arteries: Normal Skeleton: Normal.  Periapical cyst around right upper incisor Other neck: Negative for mass or adenopathy. Upper chest: Negative Review of the MIP images confirms the above findings CTA HEAD  FINDINGS Anterior  circulation: Slow flow with small caliber of the left cavernous carotid which then occludes in the supraclinoid component. Occlusion of the proximal left A1. Occlusion of left M1 segment. Occlusion of left M2 branches diffusely. Right middle cerebral artery widely patent. A2 segments patent bilaterally. Posterior circulation: Both vertebral arteries patent to the basilar. Left vertebral dominant. PICA patent bilaterally. Basilar patent. Superior cerebellar and posterior cerebral arteries patent bilaterally. Venous sinuses: Negative Anatomic variants: None Delayed phase: Not performed Review of the MIP images confirms the above findings CT Brain Perfusion Findings: CBF (<30%) Volume: 152mL Perfusion (Tmax>6.0s) volume: 16mL Mismatch Volume: 38mL Infarction Location:Left MCA territory including left basal ganglia. IMPRESSION: Large territory left MCA acute infarct. 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 Occlusion left A1 with reconstitution left A2. Occlusion left M1 and M2 segments. Call is currently in for Dr. Leonides Schanz. Electronically Signed   By: Franchot Gallo M.D.   On: 02/19/2018 12:59   Dg Chest 1 View  Result Date: 02/19/2018 CLINICAL DATA:  37 year old male with history of stroke. Post procedure chest x-ray. EXAM: CHEST  1 VIEW COMPARISON:  No priors. FINDINGS: An endotracheal tube is in place with tip 3.1 cm above the carina. Nasogastric tube extends into the proximal stomach. Ill-defined opacity in the right lung base which is coincident with the in divisum tubing, potentially artifactual. Lung volumes are low. No other definite consolidative airspace disease. No pleural effusions. No evidence of pulmonary edema. Heart size is normal. Upper mediastinal contours are within normal limits. IMPRESSION: 1. Support apparatus, as above. 2. Low lung volumes without  definite radiographic evidence of acute cardiopulmonary disease. Electronically Signed   By: Vinnie Langton M.D.   On: 02/19/2018 16:24   Ct Head Wo Contrast  Result Date: 02/23/2018 CLINICAL DATA:  38 y/o  M; decompressive craniectomy. EXAM: CT HEAD WITHOUT CONTRAST TECHNIQUE: Contiguous axial images were obtained from the base of the skull through the vertex without intravenous contrast. COMPARISON:  02/21/2018 CT head.  02/20/2018 MRI of the head. FINDINGS: Brain: Severe progressed edema throughout the left MCA infarction. Further decrease in attenuation of hyperdense material in the center of the infarct. Mass effect from edema has increased with mild herniation of the hemisphere through the craniectomy, partial effacement of left lateral ventricle, and 5 mm of left-to-right midline shift. There is slight interval dilatation of the right lateral ventricle which may represent developing entrapment. There is a small volume of acute extra-axial hemorrhage posterior cranial margin compatible postoperative change. Vascular: Density within left internal carotid artery and MCA compatible with thrombosis. Skull: Interval left lateral craniectomy with extensive edema throughout the left scalp, periorbital compartment, and face, several small foci of air, and a small air-fluid level compatible postoperative changes. Sinuses/Orbits: Mild mucosal thickening within the sphenoid sinus and the right maxillary sinus. Normal aeration of mastoid air cells. Orbital compartments are unremarkable. Other: None. IMPRESSION: 1. Interval postsurgical changes related to left hemi craniectomy. 2. Increased edema and mass effect associated with the left MCA infarct with mild herniation of the left hemisphere through the craniectomy, further effacement of left lateral ventricle, and 5 mm left-to-right midline shift. 3. Slight interval increased dilatation of right lateral ventricle may represent entrapment, attention at follow-up  recommended. Electronically Signed   By: Kristine Garbe M.D.   On: 02/23/2018 06:05   Ct Head Wo Contrast  Result Date: 02/21/2018 CLINICAL DATA:  Stroke follow-up EXAM:  CT HEAD WITHOUT CONTRAST TECHNIQUE: Contiguous axial images were obtained from the base of the skull through the vertex without intravenous contrast. COMPARISON:  Head CT 02/19/2018 FINDINGS: Brain: There is increased hypodensity throughout the left MCA distribution in the location of the known infarct. Hyperdense material within the infarcted region has also decreased in density, with no increased volume. Mass effect on the left lateral ventricle is increased and there is now rightward midline shift measuring 4 mm at the level of the foramina of [place]. No hydrocephalus. Right hemisphere and cerebellum are normal. Vascular: Hyperattenuation of the left middle cerebral artery. Skull: The visualized skull base, calvarium and extracranial soft tissues are normal. Sinuses/Orbits: No fluid levels or advanced mucosal thickening of the visualized paranasal sinuses. No mastoid or middle ear effusion. The orbits are normal. IMPRESSION: 1. Progression of cytotoxic edema throughout the left MCA distribution, causing slight mass effect on the left lateral ventricle with 4 mm of rightward midline shift. No hydrocephalus. 2. Decreased density in unchanged distribution of hyperattenuating material in the left MCA territory, favored to indicate resolving contrast staining. Electronically Signed   By: Ulyses Jarred M.D.   On: 02/21/2018 05:15   Ct Head Wo Contrast  Result Date: 02/19/2018 CLINICAL DATA:  Followup revascularization attempt. Left MCA territory infarction secondary to acute dissection of the left internal carotid artery. EXAM: CT HEAD WITHOUT CONTRAST TECHNIQUE: Contiguous axial images were obtained from the base of the skull through the vertex without intravenous contrast. COMPARISON:  Multiple examinations earlier today. FINDINGS:  Brain: Developing left MCA territory infarction. Contrast staining possibly admixed with hemorrhage in the insular region. Diminished contrast staining elsewhere in the region of the infarction. Early swelling but no mass effect or shift at this time. No hydrocephalus. No extra-axial collection. Vascular: Some vascular contrast still evident. Skull: Normal Sinuses/Orbits: Clear/normal Other: None IMPRESSION: Left MCA territory infarction with early low-density and swelling but no mass effect or shift at this time. I think most the hyperdensity seen previously relates to contrast staining, much of which is diminishing or resolved. There is some persistent contrast staining in the region of the insula and I could not exclude that there is a small amount of admixed hemorrhage. Electronically Signed   By: Nelson Chimes M.D.   On: 02/19/2018 20:33   Ct Head Wo Contrast  Result Date: 02/19/2018 CLINICAL DATA:  Stroke post intervention. EXAM: CT HEAD WITHOUT CONTRAST TECHNIQUE: Contiguous axial images were obtained from the base of the skull through the vertex without intravenous contrast. COMPARISON:  CT head 02/19/2018 FINDINGS: Brain: Large territory left MCA infarct has become more evident on CT with diffuse low-density throughout the left MCA territory including the basal ganglia. Interval development of patchy areas of acute hemorrhage within the infarct involving the left frontal, left parietal, and left insular region. Chronic hemorrhage also in the left lateral basal ganglia. High density in the left posterior insula likely represents extravasation of contrast. Mild local mass-effect without midline shift. Ventricle size remains normal. No other acute infarct. Vascular: Diffuse arterial enhancement due to prior angiography in CTA Skull: Negative Sinuses/Orbits: Mild mucosal edema paranasal sinuses. Negative orbit Other: None IMPRESSION: Large territory acute infarct left MCA territory. Interval development of  multiple areas of patchy hemorrhage within the infarct following clot retrieval. No midline shift. The images were reviewed with Dr. Earleen Newport. Electronically Signed   By: Franchot Gallo M.D.   On: 02/19/2018 16:18   Ct Angio Neck W Or Wo Contrast  Result Date: 02/19/2018  CLINICAL DATA:  Stroke.  Right-sided weakness aphasia EXAM: CT ANGIOGRAPHY HEAD AND NECK CT PERFUSION BRAIN TECHNIQUE: Multidetector CT imaging of the head and neck was performed using the standard protocol during bolus administration of intravenous contrast. Multiplanar CT image reconstructions and MIPs were obtained to evaluate the vascular anatomy. Carotid stenosis measurements (when applicable) are obtained utilizing NASCET criteria, using the distal internal carotid diameter as the denominator. Multiphase CT imaging of the brain was performed following IV bolus contrast injection. Subsequent parametric perfusion maps were calculated using RAPID software. CONTRAST:  43mL ISOVUE-370 IOPAMIDOL (ISOVUE-370) INJECTION 76% COMPARISON:  CT head 02/19/2018 FINDINGS: CTA NECK FINDINGS Aortic arch: Normal aortic arch without atherosclerotic disease or aneurysm. Proximal great vessels normal Right carotid system: Normal right carotid. No evidence of atherosclerotic disease dissection or stenosis Left carotid system: Irregularity and stenosis of the proximal left internal carotid artery compatible with dissection. No significant calcification. Intraluminal filling defect compatible with thrombus. Left internal carotid artery small in caliber but patent to the cavernous segment. There is occlusion of the supraclinoid internal carotid artery on the left. Vertebral arteries: Normal Skeleton: Normal.  Periapical cyst around right upper incisor Other neck: Negative for mass or adenopathy. Upper chest: Negative Review of the MIP images confirms the above findings CTA HEAD FINDINGS Anterior circulation: Slow flow with small caliber of the left cavernous carotid  which then occludes in the supraclinoid component. Occlusion of the proximal left A1. Occlusion of left M1 segment. Occlusion of left M2 branches diffusely. Right middle cerebral artery widely patent. A2 segments patent bilaterally. Posterior circulation: Both vertebral arteries patent to the basilar. Left vertebral dominant. PICA patent bilaterally. Basilar patent. Superior cerebellar and posterior cerebral arteries patent bilaterally. Venous sinuses: Negative Anatomic variants: None Delayed phase: Not performed Review of the MIP images confirms the above findings CT Brain Perfusion Findings: CBF (<30%) Volume: 128mL Perfusion (Tmax>6.0s) volume: 130mL Mismatch Volume: 67mL Infarction Location:Left MCA territory including left basal ganglia. IMPRESSION: Large territory left MCA acute infarct. 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 Occlusion left A1 with reconstitution left A2. Occlusion left M1 and M2 segments. Call is currently in for Dr. Leonides Schanz. Electronically Signed   By: Franchot Gallo M.D.   On: 02/19/2018 12:59   Mr Jodene Nam Head Wo Contrast  Result Date: 02/20/2018 CLINICAL DATA:  Stroke post left MCA clot retrieval. Left carotid dissection EXAM: MRI HEAD WITHOUT CONTRAST MRA HEAD WITHOUT CONTRAST TECHNIQUE: Multiplanar, multiecho pulse sequences of the brain and surrounding structures were obtained without intravenous contrast. Angiographic images of the head were obtained using MRA technique without contrast. COMPARISON:  CT head 02/19/2018 FINDINGS: MRI HEAD FINDINGS Brain: Large territory acute infarct left MCA territory. Acute infarct throughout the left basal ganglia and entire left MCA territory. Mild amount of hemorrhage in the left frontal and parietal lobe. Diffuse edema with mass-effect and 2 mm midline shift to the right. No other areas of infarct hemorrhage or mass.  Vascular: Thrombosed left internal carotid artery. Normal flow void right internal carotid artery and posterior circulation Skull and upper cervical spine: Negative Sinuses/Orbits: Negative Other: None MRA HEAD FINDINGS Left internal carotid artery is occluded through the cervical and cavernous segment. Left middle cerebral artery is occluded including the left M1 M2 and M3 segments. Left anterior cerebral artery is patent presumably supplied from the right. Right internal carotid artery widely patent. Right anterior and middle cerebral arteries normal.  Both vertebral arteries patent to the basilar. PICA patent bilaterally. Basilar widely patent. Superior cerebellar and posterior cerebral arteries normal bilaterally Negative for cerebral aneurysm IMPRESSION: Acute infarct of the entire left MCA territory with mild associated hemorrhage in the left parietal and frontal lobe. Diffuse edema. 2 mm midline shift to the right. No other infarct Occlusion of the left internal carotid artery and entire left middle cerebral artery Electronically Signed   By: Franchot Gallo M.D.   On: 02/20/2018 10:52   Mr Brain Wo Contrast  Result Date: 02/20/2018 CLINICAL DATA:  Stroke post left MCA clot retrieval. Left carotid dissection EXAM: MRI HEAD WITHOUT CONTRAST MRA HEAD WITHOUT CONTRAST TECHNIQUE: Multiplanar, multiecho pulse sequences of the brain and surrounding structures were obtained without intravenous contrast. Angiographic images of the head were obtained using MRA technique without contrast. COMPARISON:  CT head 02/19/2018 FINDINGS: MRI HEAD FINDINGS Brain: Large territory acute infarct left MCA territory. Acute infarct throughout the left basal ganglia and entire left MCA territory. Mild amount of hemorrhage in the left frontal and parietal lobe. Diffuse edema with mass-effect and 2 mm midline shift to the right. No other areas of infarct hemorrhage or mass. Vascular: Thrombosed left internal carotid artery. Normal  flow void right internal carotid artery and posterior circulation Skull and upper cervical spine: Negative Sinuses/Orbits: Negative Other: None MRA HEAD FINDINGS Left internal carotid artery is occluded through the cervical and cavernous segment. Left middle cerebral artery is occluded including the left M1 M2 and M3 segments. Left anterior cerebral artery is patent presumably supplied from the right. Right internal carotid artery widely patent. Right anterior and middle cerebral arteries normal. Both vertebral arteries patent to the basilar. PICA patent bilaterally. Basilar widely patent. Superior cerebellar and posterior cerebral arteries normal bilaterally Negative for cerebral aneurysm IMPRESSION: Acute infarct of the entire left MCA territory with mild associated hemorrhage in the left parietal and frontal lobe. Diffuse edema. 2 mm midline shift to the right. No other infarct Occlusion of the left internal carotid artery and entire left middle cerebral artery Electronically Signed   By: Franchot Gallo M.D.   On: 02/20/2018 10:52   [place]  Result Date: 02/20/2018 INDICATION: 37 year old male with a history of acute left MCA stroke Given the patient's age and baseline function, thrombectomy was offered to the patient's family as a possibility of improving his overall function and giving him the best chance of recovery, accepting a high risk of intracranial hemorrhage/malignant edema given the size of the infarction. EXAM: ULTRASOUND GUIDED ACCESS RIGHT COMMON FEMORAL ARTERY CEREBRAL ANGIOGRAM MECHANICAL THROMBECTOMY LEFT MCA CLOSURE OF ACCESS SITE WITH ANGIO-SEAL DEVICE COMPARISON:  CT 02/19/2018, CT ANGIOGRAM 02/19/2018 MEDICATIONS: 2 g Ancef. The antibiotic was administered within 1 hour of the procedure ANESTHESIA/SEDATION: GENERAL ENDOTRACHEAL TUBE ANESTHESIA WITH THE ANESTHESIA TEAM CONTRAST:  140 cc Isovue FLUOROSCOPY TIME:  Fluoroscopy Time: 35 minutes 6 seconds (2373 mGy). COMPLICATIONS: None  TECHNIQUE: Informed written consent was obtained from the patient's family after a thorough discussion of the procedural risks, benefits and alternatives. Specific risks discussed include: Bleeding, infection, contrast reaction, kidney injury/failure, need for further procedure/surgery, arterial injury or dissection, embolization to new territory, intracranial hemorrhage (10-15% risk), neurologic deterioration, cardiopulmonary collapse, death. All questions were addressed. Maximal Sterile Barrier Technique was utilized including during the procedure including caps, mask, sterile gowns, sterile gloves, sterile drape, hand hygiene and skin antiseptic. A timeout was performed prior to the initiation of the procedure. The anesthesia team was present to provide  general endotracheal tube anesthesia and for patient monitoring during the procedure. Interventional neuro radiology nursing staff was also present. FINDINGS: Ultrasound images were performed of the right inguinal region, confirming patency of the right common femoral artery. Images were stored and sent to PACs. Initial Findings: Left common carotid artery:  Normal course caliber and contour. Left external carotid artery: Patent with antegrade flow. Left internal carotid artery: There is irregular appearance of the posterior proximal internal carotid artery extending from the carotid bulb along the proximal 3rd of the right internal carotid artery. This results in at least 50 percent narrowing, with irregular blunted appearance on the distal filling defect, compatible with thrombus/dissection. The mid and distal cervical segment of the ICA is patent. Unremarkable petrous segment, lacerum segment, cavernous segment, clinoid segment/ophthalmic segment and terminal segment. Left MCA: MCA occluded at the origin with a reversed meniscus suggesting acute thrombus. Patent anterior choroidal artery and a small posterior communicating artery, which washes out from  collateral flow from the posterior. The initial injection demonstrates patent meningo-hypophyseal branch, with sellar blush. Left ACA: A 1 segment patent. A 2 segment perfuses the left territory. Patent anterior communicating artery with transient perfusion of the right ACA territory. Completion Findings: Left MCA: After mechanical thrombectomy, there is reperfusion of the middle cerebral artery, with slow flow through the insular branches, TICI 2b. Early venous drainage is visualized. Upon withdrawal into the common carotid artery, there is occlusion of the proximal cervical ICA secondary to acute dissection/thrombus. External carotid artery remains patent, with early perfusion changes of external to internal collateralization, as there is filling of the carotid siphon via ascending cervical. Right common carotid artery:  Normal course caliber and contour. Right external carotid artery: Patent with antegrade flow. Right internal carotid artery: Normal course caliber and contour of the cervical portion. Vertical and petrous segment patent with normal course caliber contour. Lacerum segment patent. cavernous segment patent. Clinoid segment patent. Antegrade flow of the ophthalmic artery. Ophthalmic segment patent. Terminus patent. Crossover flow into the left hemisphere, with perfusion of both the ACA territory and the MCA territory at the conclusion. Right MCA: M1 segment patent. Insular and opercular segments patent. Unremarkable caliber and course of the cortical segments. Typical arterial, capillary/ parenchymal, and venous phase. Right ACA: A 1 segment patent. A 2 segment perfuses the right territory. Right vertebral: Cervical vertebral artery have normal course caliber and contour with expected filling of the C3, C2, C1 branches. The dominant runoff of the right vertebral artery is into a right posterior inferior cerebellar artery, with a small right vertebral artery to basilar contribution. The majority of  flow into the basilar from the right is washed out from a left vertebral artery inflow. Left vertebral: Normal course caliber and contour of left vertebral artery. Dominant left vertebral artery. Expected filling of the C3, C2, C1 branches. Dominant vertebral artery contributes to basilar artery, with patent left posterior inferior cerebellar artery, patent bilateral superior cerebellar artery, and patent bilateral posterior cerebral artery, with symmetric opacification from the left vertebral injection. The left posterior cerebral artery contributes to perfusion of the left temporal lobe and occipital lobe. Flat panel CT: CT demonstrates no significant mass effect, however, at the conclusion there is evidence of contrast staining of the left MCA territory gyri as well as a combination of ICH/SAH at the left basal ganglia and sylvian fissure. Evidence of involving left MCA infarction. PROCEDURE: Patient is brought emergently to the neuro angiography suite, with the patient identified appropriately and placed supine  position on the table. Left radial arterial line was placed by the anesthesia team. The patient is then prepped and draped in the usual sterile fashion. Ultrasound survey of the right inguinal region was performed with images stored and sent to PACs, confirming patency of the common femoral artery. 11 blade scalpel was used to make a small incision. Blunt dissection was performed. A micropuncture needle was used access the right common femoral artery under ultrasound. With excellent arterial blood flow returned, an .018 micro wire was passed through the needle, observed to enter the abdominal aorta under fluoroscopy. The needle was removed, and a micropuncture sheath was placed over the wire. The inner dilator and wire were removed, and an 035 Bentson wire was advanced under fluoroscopy into the abdominal aorta. The sheath was removed and a standard 5 [place] vascular sheath was placed. The dilator was  removed and the sheath was flushed. A 74F JB-1 diagnostic catheter was advanced over the wire to the proximal descending thoracic aorta. Wire was then removed. Double flush of the catheter was performed. Catheter was then used to select the left common carotid artery. Formal angiogram was performed of the cervical and cerebral vessels. Glidewire was then used to navigate across the dissected proximal cervical ICA. Diagnostic catheter was then advanced into the distal cervical ICA. Exchange length Rosen wire was then passed through the diagnostic catheter to the distal cervical ICA and the diagnostic catheter was removed. The 5 French sheath was removed and exchanged for 8 French 55 centimeter BrightTip sheath. Sheath was flushed and attached to pressurized and heparinized saline bag for constant forward flow. Then a neuron Max 80cm catheter was prepared on the back table. Ace 68 intermediate catheter was then loaded though the Neuron Max catheter with coaxial microcatheter, and advanced through the guide catheter. Under roadmap angiogram, Microcatheter system was then introduced through the Ace catheter using a synchro soft 014 wire and a Trevo Provue18 catheter. Microcatheter system was advanced into the internal carotid artery, to the level of the occlusion. The micro wire and the microcatheter were carefully placed at the proximal occlusion, and the aspiration catheter was advanced to the occlusion. Microwire in the microcatheter were withdrawn, an aspiration was performed. Aspiration was initiated, confirming return of blood flow. The catheter was then advanced into the occlusion with stasis of blood flow returned. With no blood flow returned after 30 seconds-60 seconds of aspiration, the aspiration catheter was removed entirely from the neuron max. This catheter was then flushed, with clot retrieved. Repeat angiogram performed. Further targets with thrombus were identified within M2 branches. The aspiration  catheter was then used as a intermediate catheter, with the microcatheter advanced with the synchro wire. Under road map angiogram, the microcatheter and microwire were advanced beyond the M2 thrombus. The wire was removed and gentle aspiration was performed confirming blood flow returned. Gentle injection was performed to prove intraluminal location. A rotating hemostatic valve was then attached to the back end of the microcatheter, and a pressurized and heparinized saline bag was attached to the catheter. 4 x 40 solitaire device was then selected. Back flush was achieved at the rotating hemostatic valve, and then the device was gently advanced through the microcatheter to the distal end. The retriever was then unsheathed by withdrawing the microcatheter under fluoroscopy. Once the retriever was completely unsheathed, control angiogram was performed from the balloon catheter. Constant aspiration was then performed through the Ace catheter as the retriever was gently and slowly withdrawn with fluoroscopic observation. Once  the retriever was entirely removed from the system, free aspiration was confirmed at the hub of the intermediate catheter, with free blood return confirmed. Control angiogram was then performed. Persistent occlusion at the proximal M2 branch was confirmed. The microcatheter system was then again advanced through the Ace catheter. Once the micro wire microcatheter were beyond the M2 occlusion, the micro wire was removed and solitaire 4 x 40 device was deployed. After the solitaire was deployed across the occlusion, the Ace catheter was advanced into the M1 segment at the site of the occlusion. Local aspiration was performed upon withdrawal of the solitaire device under fluoroscopic observation. Once the retrieve her was entirely removed from the system, free aspiration was confirmed at the hub of the intermediate catheter, with free blood return confirmed. Ace aspiration catheter was removed.  Control angiogram was again performed. 035 rose in wire was then advanced through the neuron max into the cervical segment. The neuron max was withdrawn proximal to the bifurcation with repeat angiogram performed. At this point, the results and evolution of the case as well as the patient management was thoroughly discussed with the neurology team. We elected at this time to perform flat panel CT. After the results demonstrated presence of subarachnoid/ICH, we elected not to place a carotid stent. The neuro on max was removed. Completion cerebral angiogram was then performed of the right carotid system and the bilateral vertebral arteries. Catheter was removed, and the bright tip sheath was exchanged for a short 8 French sheath at the right common femoral artery. Eight [place] Angio-Seal was deployed for hemostasis. Patient tolerated the procedure well and remained hemodynamically stable throughout. No complications were encountered. IMPRESSION: Status post ultrasound guided access right common femoral artery for cerebral angiogram and mechanical thrombectomy of left MCA ELVO, achieving TICI 2b reperfusion after 3 passes with a combination of ADAPT technique and SOLUMBRA technique using a 4x40 solitaire. Status post deployment of Angio-Seal for hemostasis at the access site. Cerebral angiogram confirms acute left ICA dissection as the source of the intracranial emboli, with cervical ICA occluded at the case conclusion. Signed, Dulcy Fanny. Dellia Nims, RPVI [facility] Radiology PLAN: Given the presence of hemorrhage, the patient will stay intubated. Straight to CT for baseline CT scan. Right common femoral artery has been closed with Angio-Seal. Local wound care and right hip straight for 4 hours. The patient will be admitted to the neuro ICU. Target blood pressure goal of less than 195 systolic Electronically Signed   By: Corrie Mckusick D.O.   On: 02/20/2018 14:20   Ir  US Guide Vasc Access Right  Result Date: 02/21/2018 INDICATION: 37 year old male with a history of acute left MCA stroke  Given the patient's age and baseline function, thrombectomy was offered to the patient's family as a possibility of improving his overall function and giving him the best chance of recovery, accepting a high risk of intracranial hemorrhage/malignant edema given the size of the infarction.  EXAM: ULTRASOUND GUIDED ACCESS RIGHT COMMON FEMORAL ARTERY  CEREBRAL ANGIOGRAM  MECHANICAL THROMBECTOMY LEFT MCA  CLOSURE OF ACCESS SITE WITH ANGIO-SEAL DEVICE  COMPARISON:  CT 02/19/2018, CT ANGIOGRAM 02/19/2018  MEDICATIONS: 2 g Ancef. The antibiotic was administered within 1 hour of the procedure  ANESTHESIA/SEDATION: GENERAL ENDOTRACHEAL TUBE ANESTHESIA WITH THE ANESTHESIA TEAM  CONTRAST:  140 cc Isovue  FLUOROSCOPY TIME:  Fluoroscopy Time: 35 minutes 6 seconds (2373 mGy).  COMPLICATIONS: None  TECHNIQUE: Informed written consent was obtained from the patient's family  after a thorough discussion of the procedural risks, benefits and alternatives. Specific risks discussed include: Bleeding, infection, contrast reaction, kidney injury/failure, need for further procedure/surgery, arterial injury or dissection, embolization to new territory, intracranial hemorrhage (10-15% risk), neurologic deterioration, cardiopulmonary collapse, death. All questions were addressed. Maximal Sterile Barrier Technique was utilized including during the procedure including caps, mask, sterile gowns, sterile gloves, sterile drape, hand hygiene and skin antiseptic. A timeout was performed prior to the initiation of the procedure.  The anesthesia team was present to provide general endotracheal tube anesthesia and for patient monitoring during the procedure. Interventional neuro radiology nursing staff was also present.  FINDINGS: Ultrasound images were performed of the right inguinal region, confirming patency of the  right common femoral artery. Images were stored and sent to PACs.  Initial Findings:  Left common carotid artery:  Normal course caliber and contour.  Left external carotid artery: Patent with antegrade flow.  Left internal carotid artery: There is irregular appearance of the posterior proximal internal carotid artery extending from the carotid bulb along the proximal 3rd of the right internal carotid artery. This results in at least 50 percent narrowing, with irregular blunted appearance on the distal filling defect, compatible with thrombus/dissection.  The mid and distal cervical segment of the ICA is patent. Unremarkable petrous segment, lacerum segment, cavernous segment, clinoid segment/ophthalmic segment and terminal segment.  Left MCA: MCA occluded at the origin with a reversed meniscus suggesting acute thrombus. Patent anterior choroidal artery and a small posterior communicating artery, which washes out from collateral flow from the posterior. The initial injection demonstrates patent meningo-hypophyseal branch, with sellar blush.  Left ACA: A 1 segment patent. A 2 segment perfuses the left territory. Patent anterior communicating artery with transient perfusion of the right ACA territory.  Completion Findings:  Left MCA: After mechanical thrombectomy, there is reperfusion of the middle cerebral artery, with slow flow through the insular branches, TICI 2b.  Early venous drainage is visualized.  Upon withdrawal into the common carotid artery, there is occlusion of the proximal cervical ICA secondary to acute dissection/thrombus. External carotid artery remains patent, with early perfusion changes of external to internal collateralization, as there is filling of the carotid siphon via ascending cervical.  Right common carotid artery:  Normal course caliber and contour.  Right external carotid artery: Patent with antegrade flow.  Right internal carotid artery: Normal course caliber and contour  of the cervical portion. Vertical and petrous segment patent with normal course caliber contour. Lacerum segment patent. cavernous segment patent. Clinoid segment patent. Antegrade flow of the ophthalmic artery. Ophthalmic segment patent. Terminus patent. Crossover flow into the left hemisphere, with perfusion of both the ACA territory and the MCA territory at the conclusion.  Right MCA: M1 segment patent. Insular and opercular segments patent. Unremarkable caliber and course of the cortical segments. Typical arterial, capillary/ parenchymal, and venous phase.  Right ACA: A 1 segment patent. A 2 segment perfuses the right territory.  Right vertebral:  Cervical vertebral artery have normal course caliber and contour with expected filling of the C3, C2, C1 branches. The dominant runoff of the right vertebral artery is into a right posterior inferior cerebellar artery, with a small right vertebral artery to basilar contribution. The majority of flow into the basilar from the right is washed out from a left vertebral artery inflow.  Left vertebral:  Normal course caliber and contour of left vertebral artery. Dominant left vertebral artery. Expected filling of the C3, C2, C1 branches.  Dominant vertebral  artery contributes to basilar artery, with patent left posterior inferior cerebellar artery, patent bilateral superior cerebellar artery, and patent bilateral posterior cerebral artery, with symmetric opacification from the left vertebral injection. The left posterior cerebral artery contributes to perfusion of the left temporal lobe and occipital lobe.  Flat panel CT:  CT demonstrates no significant mass effect, however, at the conclusion there is evidence of contrast staining of the left MCA territory gyri as well as a combination of ICH/SAH at the left basal ganglia and sylvian fissure. Evidence of involving left MCA infarction.  PROCEDURE: Patient is brought emergently to the neuro angiography suite, with  the patient identified appropriately and placed supine position on the table. Left radial arterial line was placed by the anesthesia team.  The patient is then prepped and draped in the usual sterile fashion.  Ultrasound survey of the right inguinal region was performed with images stored and sent to PACs, confirming patency of the common femoral artery.  11 blade scalpel was used to make a small incision. Blunt dissection was performed. A micropuncture needle was used access the right common femoral artery under ultrasound. With excellent arterial blood flow returned, an .018 micro wire was passed through the needle, observed to enter the abdominal aorta under fluoroscopy. The needle was removed, and a micropuncture sheath was placed over the wire. The inner dilator and wire were removed, and an 035 Bentson wire was advanced under fluoroscopy into the abdominal aorta. The sheath was removed and a standard 5 [place] vascular sheath was placed. The dilator was removed and the sheath was flushed.  A 34F JB-1 diagnostic catheter was advanced over the wire to the proximal descending thoracic aorta. Wire was then removed. Double flush of the catheter was performed. Catheter was then used to select the left common carotid artery.  Formal angiogram was performed of the cervical and cerebral vessels.  Glidewire was then used to navigate across the dissected proximal cervical ICA. Diagnostic catheter was then advanced into the distal cervical ICA.  Exchange length Rosen wire was then passed through the diagnostic catheter to the distal cervical ICA and the diagnostic catheter was removed. The 5 French sheath was removed and exchanged for 8 French 55 centimeter BrightTip sheath. Sheath was flushed and attached to pressurized and heparinized saline bag for constant forward flow. Then a neuron Max 80cm catheter was prepared on the back table.  Ace 68 intermediate catheter was then loaded though the Neuron Max catheter  with coaxial microcatheter, and advanced through the guide catheter.  Under roadmap angiogram, Microcatheter system was then introduced through the Ace catheter using a synchro soft 014 wire and a Trevo Provue18 catheter. Microcatheter system was advanced into the internal carotid artery, to the level of the occlusion. The micro wire and the microcatheter were carefully placed at the proximal occlusion, and the aspiration catheter was advanced to the occlusion. Microwire in the microcatheter were withdrawn, an aspiration was performed.  Aspiration was initiated, confirming return of blood flow. The catheter was then advanced into the occlusion with stasis of blood flow returned.  With no blood flow returned after 30 seconds-60 seconds of aspiration, the aspiration catheter was removed entirely from the neuron max. This catheter was then flushed, with clot retrieved.  Repeat angiogram performed. Further targets with thrombus were identified within M2 branches.  The aspiration catheter was then used as a intermediate catheter, with the microcatheter advanced with the synchro wire. Under road map angiogram, the microcatheter and microwire were advanced beyond  the M2 thrombus. The wire was removed and gentle aspiration was performed confirming blood flow returned. Gentle injection was performed to prove intraluminal location.  A rotating hemostatic valve was then attached to the back end of the microcatheter, and a pressurized and heparinized saline bag was attached to the catheter.  4 x 40 solitaire device was then selected. Back flush was achieved at the rotating hemostatic valve, and then the device was gently advanced through the microcatheter to the distal end. The retriever was then unsheathed by withdrawing the microcatheter under fluoroscopy. Once the retriever was completely unsheathed, control angiogram was performed from the balloon catheter.  Constant aspiration was then performed through the Ace  catheter as the retriever was gently and slowly withdrawn with fluoroscopic observation. Once the retriever was entirely removed from the system, free aspiration was confirmed at the hub of the intermediate catheter, with free blood return confirmed.  Control angiogram was then performed. Persistent occlusion at the proximal M2 branch was confirmed.  The microcatheter system was then again advanced through the Ace catheter.  Once the micro wire microcatheter were beyond the M2 occlusion, the micro wire was removed and solitaire 4 x 40 device was deployed. After the solitaire was deployed across the occlusion, the Ace catheter was advanced into the M1 segment at the site of the occlusion.  Local aspiration was performed upon withdrawal of the solitaire device under fluoroscopic observation. Once the retrieve her was entirely removed from the system, free aspiration was confirmed at the hub of the intermediate catheter, with free blood return confirmed.  Ace aspiration catheter was removed.  Control angiogram was again performed.  035 rose in wire was then advanced through the neuron max into the cervical segment. The neuron max was withdrawn proximal to the bifurcation with repeat angiogram performed.  At this point, the results and evolution of the case as well as the patient management was thoroughly discussed with the neurology team. We elected at this time to perform flat panel CT. After the results demonstrated presence of subarachnoid/ICH, we elected not to place a carotid stent.  The neuro on max was removed.  Completion cerebral angiogram was then performed of the right carotid system and the bilateral vertebral arteries.  Catheter was removed, and the bright tip sheath was exchanged for a short 8 French sheath at the right common femoral artery.  Eight [place] Angio-Seal was deployed for hemostasis.  Patient tolerated the procedure well and remained hemodynamically stable throughout.  No  complications were encountered.  IMPRESSION: Status post ultrasound guided access right common femoral artery for cerebral angiogram and mechanical thrombectomy of left MCA ELVO, achieving TICI 2b reperfusion after 3 passes with a combination of ADAPT technique and SOLUMBRA technique using a 4x40 solitaire.  Status post deployment of Angio-Seal for hemostasis at the access site.  Cerebral angiogram confirms acute left ICA dissection as the source of the intracranial emboli, with cervical ICA occluded at the case conclusion.  Signed,  Dulcy Fanny. Dellia Nims, RPVI  [facility]  [facility] Radiology  PLAN: Given the presence of hemorrhage, the patient will stay intubated.  Straight to CT for baseline CT scan.  Right common femoral artery has been closed with Angio-Seal. Local wound care and right hip straight for 4 hours.  The patient will be admitted to the neuro ICU.  Target blood pressure goal of less than 161 systolic   Electronically Signed   By: Corrie Mckusick D.O.   On: 02/20/2018 14:20  Ct Cerebral Perfusion W Contrast  Result Date: 02/19/2018 CLINICAL DATA:  Stroke.  Right-sided weakness aphasia EXAM: CT ANGIOGRAPHY HEAD AND NECK CT PERFUSION BRAIN TECHNIQUE: Multidetector CT imaging of the head and neck was performed using the standard protocol during bolus administration of intravenous contrast. Multiplanar CT image reconstructions and MIPs were obtained to evaluate the vascular anatomy. Carotid stenosis measurements (when applicable) are obtained utilizing NASCET criteria, using the distal internal carotid diameter as the denominator. Multiphase CT imaging of the brain was performed following IV bolus contrast injection. Subsequent parametric perfusion maps were calculated using RAPID software. CONTRAST:  81mL ISOVUE-370 IOPAMIDOL (ISOVUE-370) INJECTION 76% COMPARISON:  CT head 02/19/2018 FINDINGS: CTA NECK FINDINGS Aortic arch: Normal aortic arch without  atherosclerotic disease or aneurysm. Proximal great vessels normal Right carotid system: Normal right carotid. No evidence of atherosclerotic disease dissection or stenosis Left carotid system: Irregularity and stenosis of the proximal left internal carotid artery compatible with dissection. No significant calcification. Intraluminal filling defect compatible with thrombus. Left internal carotid artery small in caliber but patent to the cavernous segment. There is occlusion of the supraclinoid internal carotid artery on the left. Vertebral arteries: Normal Skeleton: Normal.  Periapical cyst around right upper incisor Other neck: Negative for mass or adenopathy. Upper chest: Negative Review of the MIP images confirms the above findings CTA HEAD FINDINGS Anterior circulation: Slow flow with small caliber of the left cavernous carotid which then occludes in the supraclinoid component. Occlusion of the proximal left A1. Occlusion of left M1 segment. Occlusion of left M2 branches diffusely. Right middle cerebral artery widely patent. A2 segments patent bilaterally. Posterior circulation: Both vertebral arteries patent to the basilar. Left vertebral dominant. PICA patent bilaterally. Basilar patent. Superior cerebellar and posterior cerebral arteries patent bilaterally. Venous sinuses: Negative Anatomic variants: None Delayed phase: Not performed Review of the MIP images confirms the above findings CT Brain Perfusion Findings: CBF (<30%) Volume: 157mL Perfusion (Tmax>6.0s) volume: 153mL Mismatch Volume: 3mL Infarction Location:Left MCA territory including left basal ganglia. IMPRESSION: Large territory left MCA acute infarct. 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 Occlusion left A1 with reconstitution left A2. Occlusion left M1 and M2 segments. Call is currently in for Dr. Leonides Schanz. Electronically  Signed   By: Franchot Gallo M.D.   On: 02/19/2018 12:59   Dg Chest Port 1 View  Result Date: 02/21/2018 CLINICAL DATA:  Hypoxia EXAM: PORTABLE CHEST 1 VIEW COMPARISON:  February 20, 2018 FINDINGS: Endotracheal tube tip is 4.0 cm above the carina. Nasogastric tube tip and side port are below the diaphragm. Central catheter tip is at the cavoatrial junction. No pneumothorax. There is atelectatic change in the lung bases. Lungs elsewhere clear. Heart is upper normal in size with pulmonary vascularity normal. No adenopathy. No bone lesions. IMPRESSION: Tube and catheter positions as described without pneumothorax. Lower lobe atelectatic change. No edema or consolidation. Stable cardiac silhouette. Electronically Signed   By: Lowella Grip III M.D.   On: 02/21/2018 07:25   Dg Chest Port 1 View  Result Date: 02/20/2018 CLINICAL DATA:  Respiratory failure,central line placement EXAM: PORTABLE CHEST 1 VIEW COMPARISON:  02/20/2018 FINDINGS: Endotracheal tube is in place, tip approximately 7.1 centimeters above the carina. Nasogastric tube is in place, tip beyond the level of the mid stomach. A LEFT IJ central line tip overlies the superior vena cava and is new since the previous exam. There is no pneumothorax.  Heart size is normal. Mild subsegmental atelectasis in the MEDIAL LEFT lung base. Remote RIGHT rib fracture. IMPRESSION: Interval placement of LEFT IJ central line.  No pneumothorax. LEFT LOWER lobe atelectasis. Electronically Signed   By: Nolon Nations M.D.   On: 02/20/2018 16:50   Dg Chest Port 1 View  Result Date: 02/20/2018 CLINICAL DATA:  Endotracheal tube placement EXAM: PORTABLE CHEST 1 VIEW COMPARISON:  02/20/2018 FINDINGS: Endotracheal tube 6.5 cm above the carina unchanged from earlier today. NG tube enters the stomach with side hole in the distal esophagus similar to the prior study. Mild bibasilar atelectasis similar to the prior study. Negative for edema or effusion IMPRESSION: Endotracheal  tube 6.5 cm above the carina unchanged NG tube in the distal esophagus unchanged Mild bibasilar atelectasis unchanged Electronically Signed   By: Franchot Gallo M.D.   On: 02/20/2018 15:34   Dg Chest Port 1 View  Result Date: 02/20/2018 CLINICAL DATA:  37 year old male with respiratory failure status post left internal carotid artery dissection and left MCA territory infarct. EXAM: PORTABLE CHEST 1 VIEW COMPARISON:  Prior chest x-ray 02/19/2018 FINDINGS: The patient is intubated. The tip of the endotracheal tube is 6.8 cm above the carina. A nasogastric tube is present. The tip of the tube overlies the GE junction and is not within the stomach. Low inspiratory volumes with perhaps minimal bibasilar atelectasis. The lungs are otherwise clear. Remote healed right seventh rib fracture. IMPRESSION: 1. The tip of the nasogastric tube overlies the gastroesophageal junction and is not within the stomach. If the intends to decompress the stomach, recommend advancing 10 cm. 2. Low inspiratory volumes with minimal bibasilar atelectasis. 3. The tip of the endotracheal tube is 6.7 cm above the carina. These results will be called to the ordering clinician or representative by the Radiologist Assistant, and communication documented in the PACS or zVision Dashboard. Electronically Signed   By: Jacqulynn Cadet M.D.   On: 02/20/2018 08:55   Ir Percutaneous Art Thrombectomy/infusion Intracranial Inc Diag Angio  Result Date: 02/20/2018 INDICATION: 37 year old male with a history of acute left MCA stroke Given the patient's age and baseline function, thrombectomy was offered to the patient's family as a possibility of improving his overall function and giving him the best chance of recovery, accepting a high risk of intracranial hemorrhage/malignant edema given the size of the infarction. EXAM: ULTRASOUND GUIDED ACCESS RIGHT COMMON FEMORAL ARTERY CEREBRAL ANGIOGRAM MECHANICAL THROMBECTOMY LEFT MCA CLOSURE OF ACCESS SITE WITH  ANGIO-SEAL DEVICE COMPARISON:  CT 02/19/2018, CT ANGIOGRAM 02/19/2018 MEDICATIONS: 2 g Ancef. The antibiotic was administered within 1 hour of the procedure ANESTHESIA/SEDATION: GENERAL ENDOTRACHEAL TUBE ANESTHESIA WITH THE ANESTHESIA TEAM CONTRAST:  140 cc Isovue FLUOROSCOPY TIME:  Fluoroscopy Time: 35 minutes 6 seconds (2373 mGy). COMPLICATIONS: None TECHNIQUE: Informed written consent was obtained from the patient's family after a thorough discussion of the procedural risks, benefits and alternatives. Specific risks discussed include: Bleeding, infection, contrast reaction, kidney injury/failure, need for further procedure/surgery, arterial injury or dissection, embolization to new territory, intracranial hemorrhage (10-15% risk), neurologic deterioration, cardiopulmonary collapse, death. All questions were addressed. Maximal Sterile Barrier Technique was utilized including during the procedure including caps, mask, sterile gowns, sterile gloves, sterile drape, hand hygiene and skin antiseptic. A timeout was performed prior to the initiation of the procedure. The anesthesia team was present to provide general endotracheal tube anesthesia and for patient monitoring during the procedure. Interventional neuro radiology nursing staff was also present. FINDINGS: Ultrasound images were performed of the right inguinal region,  confirming patency of the right common femoral artery. Images were stored and sent to PACs. Initial Findings: Left common carotid artery:  Normal course caliber and contour. Left external carotid artery: Patent with antegrade flow. Left internal carotid artery: There is irregular appearance of the posterior proximal internal carotid artery extending from the carotid bulb along the proximal 3rd of the right internal carotid artery. This results in at least 50 percent narrowing, with irregular blunted appearance on the distal filling defect, compatible with thrombus/dissection. The mid and distal  cervical segment of the ICA is patent. Unremarkable petrous segment, lacerum segment, cavernous segment, clinoid segment/ophthalmic segment and terminal segment. Left MCA: MCA occluded at the origin with a reversed meniscus suggesting acute thrombus. Patent anterior choroidal artery and a small posterior communicating artery, which washes out from collateral flow from the posterior. The initial injection demonstrates patent meningo-hypophyseal branch, with sellar blush. Left ACA: A 1 segment patent. A 2 segment perfuses the left territory. Patent anterior communicating artery with transient perfusion of the right ACA territory. Completion Findings: Left MCA: After mechanical thrombectomy, there is reperfusion of the middle cerebral artery, with slow flow through the insular branches, TICI 2b. Early venous drainage is visualized. Upon withdrawal into the common carotid artery, there is occlusion of the proximal cervical ICA secondary to acute dissection/thrombus. External carotid artery remains patent, with early perfusion changes of external to internal collateralization, as there is filling of the carotid siphon via ascending cervical. Right common carotid artery:  Normal course caliber and contour. Right external carotid artery: Patent with antegrade flow. Right internal carotid artery: Normal course caliber and contour of the cervical portion. Vertical and petrous segment patent with normal course caliber contour. Lacerum segment patent. cavernous segment patent. Clinoid segment patent. Antegrade flow of the ophthalmic artery. Ophthalmic segment patent. Terminus patent. Crossover flow into the left hemisphere, with perfusion of both the ACA territory and the MCA territory at the conclusion. Right MCA: M1 segment patent. Insular and opercular segments patent. Unremarkable caliber and course of the cortical segments. Typical arterial, capillary/ parenchymal, and venous phase. Right ACA: A 1 segment patent. A 2  segment perfuses the right territory. Right vertebral: Cervical vertebral artery have normal course caliber and contour with expected filling of the C3, C2, C1 branches. The dominant runoff of the right vertebral artery is into a right posterior inferior cerebellar artery, with a small right vertebral artery to basilar contribution. The majority of flow into the basilar from the right is washed out from a left vertebral artery inflow. Left vertebral: Normal course caliber and contour of left vertebral artery. Dominant left vertebral artery. Expected filling of the C3, C2, C1 branches. Dominant vertebral artery contributes to basilar artery, with patent left posterior inferior cerebellar artery, patent bilateral superior cerebellar artery, and patent bilateral posterior cerebral artery, with symmetric opacification from the left vertebral injection. The left posterior cerebral artery contributes to perfusion of the left temporal lobe and occipital lobe. Flat panel CT: CT demonstrates no significant mass effect, however, at the conclusion there is evidence of contrast staining of the left MCA territory gyri as well as a combination of ICH/SAH at the left basal ganglia and sylvian fissure. Evidence of involving left MCA infarction. PROCEDURE: Patient is brought emergently to the neuro angiography suite, with the patient identified appropriately and placed supine position on the table. Left radial arterial line was placed by the anesthesia team. The patient is then prepped and draped in the usual sterile fashion. Ultrasound survey of  the right inguinal region was performed with images stored and sent to PACs, confirming patency of the common femoral artery. 11 blade scalpel was used to make a small incision. Blunt dissection was performed. A micropuncture needle was used access the right common femoral artery under ultrasound. With excellent arterial blood flow returned, an .018 micro wire was passed through the needle,  observed to enter the abdominal aorta under fluoroscopy. The needle was removed, and a micropuncture sheath was placed over the wire. The inner dilator and wire were removed, and an 035 Bentson wire was advanced under fluoroscopy into the abdominal aorta. The sheath was removed and a standard 5 [place] vascular sheath was placed. The dilator was removed and the sheath was flushed. A 14F JB-1 diagnostic catheter was advanced over the wire to the proximal descending thoracic aorta. Wire was then removed. Double flush of the catheter was performed. Catheter was then used to select the left common carotid artery. Formal angiogram was performed of the cervical and cerebral vessels. Glidewire was then used to navigate across the dissected proximal cervical ICA. Diagnostic catheter was then advanced into the distal cervical ICA. Exchange length Rosen wire was then passed through the diagnostic catheter to the distal cervical ICA and the diagnostic catheter was removed. The 5 French sheath was removed and exchanged for 8 French 55 centimeter BrightTip sheath. Sheath was flushed and attached to pressurized and heparinized saline bag for constant forward flow. Then a neuron Max 80cm catheter was prepared on the back table. Ace 68 intermediate catheter was then loaded though the Neuron Max catheter with coaxial microcatheter, and advanced through the guide catheter. Under roadmap angiogram, Microcatheter system was then introduced through the Ace catheter using a synchro soft 014 wire and a Trevo Provue18 catheter. Microcatheter system was advanced into the internal carotid artery, to the level of the occlusion. The micro wire and the microcatheter were carefully placed at the proximal occlusion, and the aspiration catheter was advanced to the occlusion. Microwire in the microcatheter were withdrawn, an aspiration was performed. Aspiration was initiated, confirming return of blood flow. The catheter was then advanced into the  occlusion with stasis of blood flow returned. With no blood flow returned after 30 seconds-60 seconds of aspiration, the aspiration catheter was removed entirely from the neuron max. This catheter was then flushed, with clot retrieved. Repeat angiogram performed. Further targets with thrombus were identified within M2 branches. The aspiration catheter was then used as a intermediate catheter, with the microcatheter advanced with the synchro wire. Under road map angiogram, the microcatheter and microwire were advanced beyond the M2 thrombus. The wire was removed and gentle aspiration was performed confirming blood flow returned. Gentle injection was performed to prove intraluminal location. A rotating hemostatic valve was then attached to the back end of the microcatheter, and a pressurized and heparinized saline bag was attached to the catheter. 4 x 40 solitaire device was then selected. Back flush was achieved at the rotating hemostatic valve, and then the device was gently advanced through the microcatheter to the distal end. The retriever was then unsheathed by withdrawing the microcatheter under fluoroscopy. Once the retriever was completely unsheathed, control angiogram was performed from the balloon catheter. Constant aspiration was then performed through the Ace catheter as the retriever was gently and slowly withdrawn with fluoroscopic observation. Once the retriever was entirely removed from the system, free aspiration was confirmed at the hub of the intermediate catheter, with free blood return confirmed. Control angiogram was then performed.  Persistent occlusion at the proximal M2 branch was confirmed. The microcatheter system was then again advanced through the Ace catheter. Once the micro wire microcatheter were beyond the M2 occlusion, the micro wire was removed and solitaire 4 x 40 device was deployed. After the solitaire was deployed across the occlusion, the Ace catheter was advanced into the M1  segment at the site of the occlusion. Local aspiration was performed upon withdrawal of the solitaire device under fluoroscopic observation. Once the retrieve her was entirely removed from the system, free aspiration was confirmed at the hub of the intermediate catheter, with free blood return confirmed. Ace aspiration catheter was removed. Control angiogram was again performed. 035 rose in wire was then advanced through the neuron max into the cervical segment. The neuron max was withdrawn proximal to the bifurcation with repeat angiogram performed. At this point, the results and evolution of the case as well as the patient management was thoroughly discussed with the neurology team. We elected at this time to perform flat panel CT. After the results demonstrated presence of subarachnoid/ICH, we elected not to place a carotid stent. The neuro on max was removed. Completion cerebral angiogram was then performed of the right carotid system and the bilateral vertebral arteries. Catheter was removed, and the bright tip sheath was exchanged for a short 8 French sheath at the right common femoral artery. Eight [place] Angio-Seal was deployed for hemostasis. Patient tolerated the procedure well and remained hemodynamically stable throughout. No complications were encountered. IMPRESSION: Status post ultrasound guided access right common femoral artery for cerebral angiogram and mechanical thrombectomy of left MCA ELVO, achieving TICI 2b reperfusion after 3 passes with a combination of ADAPT technique and SOLUMBRA technique using a 4x40 solitaire. Status post deployment of Angio-Seal for hemostasis at the access site. Cerebral angiogram confirms acute left ICA dissection as the source of the intracranial emboli, with cervical ICA occluded at the case conclusion. Signed, Dulcy Fanny. Dellia Nims, RPVI [facility] Radiology PLAN: Given the presence of hemorrhage, the patient will  stay intubated. Straight to CT for baseline CT scan. Right common femoral artery has been closed with Angio-Seal. Local wound care and right hip straight for 4 hours. The patient will be admitted to the neuro ICU. Target blood pressure goal of less than 612 systolic Electronically Signed   By: Corrie Mckusick D.O.   On: 02/20/2018 14:20   Ct Head Code Stroke Wo Contrast  Result Date: 02/19/2018 CLINICAL DATA:  Code stroke.  Right-sided weakness, aphasia EXAM: CT HEAD WITHOUT CONTRAST TECHNIQUE: Contiguous axial images were obtained from the base of the skull through the vertex without intravenous contrast. COMPARISON:  None. FINDINGS: Brain: Hypodensity left occipital parietal cortex and white matter compatible with acute infarct. Patchy hypodensity in the left frontal lobe also consistent with infarct which is probably acute. Negative for hemorrhage.  Ventricle size normal.  No mass lesion. Vascular: Negative for hyperdense vessel Skull: Negative Sinuses/Orbits: Mild mucosal edema paranasal sinuses.  Normal orbit Other: None ASPECTS ([place] Stroke Program Early CT Score) - Ganglionic level infarction (caudate, lentiform nuclei, internal capsule, insula, M1-M3 cortex): 7 - Supraganglionic infarction (M4-M6 cortex): 2 Total score (0-10 with 10 being normal): 8 IMPRESSION: 1. Hypodensities left frontal lobe and left occipital parietal lobe most compatible with acute infarct. Possible emboli or watershed infarct. Negative for hemorrhage or mass-effect 2. ASPECTS is 8 3. These results were called by telephone at the time of interpretation on 02/19/2018 at 12:29 pm to  Dr. Leonel Ramsay , who verbally acknowledged these results. Electronically Signed   By: Franchot Gallo M.D.   On: 02/19/2018 12:31   Ir Angio Intra Extracran Sel Com Carotid Innominate Uni R Mod Sed  Result Date: 02/20/2018 INDICATION: 37 year old male with a history of acute left MCA stroke Given the patient's age and baseline function, thrombectomy  was offered to the patient's family as a possibility of improving his overall function and giving him the best chance of recovery, accepting a high risk of intracranial hemorrhage/malignant edema given the size of the infarction. EXAM: ULTRASOUND GUIDED ACCESS RIGHT COMMON FEMORAL ARTERY CEREBRAL ANGIOGRAM MECHANICAL THROMBECTOMY LEFT MCA CLOSURE OF ACCESS SITE WITH ANGIO-SEAL DEVICE COMPARISON:  CT 02/19/2018, CT ANGIOGRAM 02/19/2018 MEDICATIONS: 2 g Ancef. The antibiotic was administered within 1 hour of the procedure ANESTHESIA/SEDATION: GENERAL ENDOTRACHEAL TUBE ANESTHESIA WITH THE ANESTHESIA TEAM CONTRAST:  140 cc Isovue FLUOROSCOPY TIME:  Fluoroscopy Time: 35 minutes 6 seconds (2373 mGy). COMPLICATIONS: None TECHNIQUE: Informed written consent was obtained from the patient's family after a thorough discussion of the procedural risks, benefits and alternatives. Specific risks discussed include: Bleeding, infection, contrast reaction, kidney injury/failure, need for further procedure/surgery, arterial injury or dissection, embolization to new territory, intracranial hemorrhage (10-15% risk), neurologic deterioration, cardiopulmonary collapse, death. All questions were addressed. Maximal Sterile Barrier Technique was utilized including during the procedure including caps, mask, sterile gowns, sterile gloves, sterile drape, hand hygiene and skin antiseptic. A timeout was performed prior to the initiation of the procedure. The anesthesia team was present to provide general endotracheal tube anesthesia and for patient monitoring during the procedure. Interventional neuro radiology nursing staff was also present. FINDINGS: Ultrasound images were performed of the right inguinal region, confirming patency of the right common femoral artery. Images were stored and sent to PACs. Initial Findings: Left common carotid artery:  Normal course caliber and contour. Left external carotid artery: Patent with antegrade flow. Left  internal carotid artery: There is irregular appearance of the posterior proximal internal carotid artery extending from the carotid bulb along the proximal 3rd of the right internal carotid artery. This results in at least 50 percent narrowing, with irregular blunted appearance on the distal filling defect, compatible with thrombus/dissection. The mid and distal cervical segment of the ICA is patent. Unremarkable petrous segment, lacerum segment, cavernous segment, clinoid segment/ophthalmic segment and terminal segment. Left MCA: MCA occluded at the origin with a reversed meniscus suggesting acute thrombus. Patent anterior choroidal artery and a small posterior communicating artery, which washes out from collateral flow from the posterior. The initial injection demonstrates patent meningo-hypophyseal branch, with sellar blush. Left ACA: A 1 segment patent. A 2 segment perfuses the left territory. Patent anterior communicating artery with transient perfusion of the right ACA territory. Completion Findings: Left MCA: After mechanical thrombectomy, there is reperfusion of the middle cerebral artery, with slow flow through the insular branches, TICI 2b. Early venous drainage is visualized. Upon withdrawal into the common carotid artery, there is occlusion of the proximal cervical ICA secondary to acute dissection/thrombus. External carotid artery remains patent, with early perfusion changes of external to internal collateralization, as there is filling of the carotid siphon via ascending cervical. Right common carotid artery:  Normal course caliber and contour. Right external carotid artery: Patent with antegrade flow. Right internal carotid artery: Normal course caliber and contour of the cervical portion. Vertical and petrous segment patent with normal course caliber contour. Lacerum segment patent. cavernous segment patent. Clinoid segment patent. Antegrade flow of the ophthalmic artery. Ophthalmic  segment patent.  Terminus patent. Crossover flow into the left hemisphere, with perfusion of both the ACA territory and the MCA territory at the conclusion. Right MCA: M1 segment patent. Insular and opercular segments patent. Unremarkable caliber and course of the cortical segments. Typical arterial, capillary/ parenchymal, and venous phase. Right ACA: A 1 segment patent. A 2 segment perfuses the right territory. Right vertebral: Cervical vertebral artery have normal course caliber and contour with expected filling of the C3, C2, C1 branches. The dominant runoff of the right vertebral artery is into a right posterior inferior cerebellar artery, with a small right vertebral artery to basilar contribution. The majority of flow into the basilar from the right is washed out from a left vertebral artery inflow. Left vertebral: Normal course caliber and contour of left vertebral artery. Dominant left vertebral artery. Expected filling of the C3, C2, C1 branches. Dominant vertebral artery contributes to basilar artery, with patent left posterior inferior cerebellar artery, patent bilateral superior cerebellar artery, and patent bilateral posterior cerebral artery, with symmetric opacification from the left vertebral injection. The left posterior cerebral artery contributes to perfusion of the left temporal lobe and occipital lobe. Flat panel CT: CT demonstrates no significant mass effect, however, at the conclusion there is evidence of contrast staining of the left MCA territory gyri as well as a combination of ICH/SAH at the left basal ganglia and sylvian fissure. Evidence of involving left MCA infarction. PROCEDURE: Patient is brought emergently to the neuro angiography suite, with the patient identified appropriately and placed supine position on the table. Left radial arterial line was placed by the anesthesia team. The patient is then prepped and draped in the usual sterile fashion. Ultrasound survey of the right inguinal region was  performed with images stored and sent to PACs, confirming patency of the common femoral artery. 11 blade scalpel was used to make a small incision. Blunt dissection was performed. A micropuncture needle was used access the right common femoral artery under ultrasound. With excellent arterial blood flow returned, an .018 micro wire was passed through the needle, observed to enter the abdominal aorta under fluoroscopy. The needle was removed, and a micropuncture sheath was placed over the wire. The inner dilator and wire were removed, and an 035 Bentson wire was advanced under fluoroscopy into the abdominal aorta. The sheath was removed and a standard 5 [place] vascular sheath was placed. The dilator was removed and the sheath was flushed. A 9F JB-1 diagnostic catheter was advanced over the wire to the proximal descending thoracic aorta. Wire was then removed. Double flush of the catheter was performed. Catheter was then used to select the left common carotid artery. Formal angiogram was performed of the cervical and cerebral vessels. Glidewire was then used to navigate across the dissected proximal cervical ICA. Diagnostic catheter was then advanced into the distal cervical ICA. Exchange length Rosen wire was then passed through the diagnostic catheter to the distal cervical ICA and the diagnostic catheter was removed. The 5 French sheath was removed and exchanged for 8 French 55 centimeter BrightTip sheath. Sheath was flushed and attached to pressurized and heparinized saline bag for constant forward flow. Then a neuron Max 80cm catheter was prepared on the back table. Ace 68 intermediate catheter was then loaded though the Neuron Max catheter with coaxial microcatheter, and advanced through the guide catheter. Under roadmap angiogram, Microcatheter system was then introduced through the Ace catheter using a synchro soft 014 wire and a Trevo Provue18 catheter. Microcatheter system was advanced  into the internal  carotid artery, to the level of the occlusion. The micro wire and the microcatheter were carefully placed at the proximal occlusion, and the aspiration catheter was advanced to the occlusion. Microwire in the microcatheter were withdrawn, an aspiration was performed. Aspiration was initiated, confirming return of blood flow. The catheter was then advanced into the occlusion with stasis of blood flow returned. With no blood flow returned after 30 seconds-60 seconds of aspiration, the aspiration catheter was removed entirely from the neuron max. This catheter was then flushed, with clot retrieved. Repeat angiogram performed. Further targets with thrombus were identified within M2 branches. The aspiration catheter was then used as a intermediate catheter, with the microcatheter advanced with the synchro wire. Under road map angiogram, the microcatheter and microwire were advanced beyond the M2 thrombus. The wire was removed and gentle aspiration was performed confirming blood flow returned. Gentle injection was performed to prove intraluminal location. A rotating hemostatic valve was then attached to the back end of the microcatheter, and a pressurized and heparinized saline bag was attached to the catheter. 4 x 40 solitaire device was then selected. Back flush was achieved at the rotating hemostatic valve, and then the device was gently advanced through the microcatheter to the distal end. The retriever was then unsheathed by withdrawing the microcatheter under fluoroscopy. Once the retriever was completely unsheathed, control angiogram was performed from the balloon catheter. Constant aspiration was then performed through the Ace catheter as the retriever was gently and slowly withdrawn with fluoroscopic observation. Once the retriever was entirely removed from the system, free aspiration was confirmed at the hub of the intermediate catheter, with free blood return confirmed. Control angiogram was then performed.  Persistent occlusion at the proximal M2 branch was confirmed. The microcatheter system was then again advanced through the Ace catheter. Once the micro wire microcatheter were beyond the M2 occlusion, the micro wire was removed and solitaire 4 x 40 device was deployed. After the solitaire was deployed across the occlusion, the Ace catheter was advanced into the M1 segment at the site of the occlusion. Local aspiration was performed upon withdrawal of the solitaire device under fluoroscopic observation. Once the retrieve her was entirely removed from the system, free aspiration was confirmed at the hub of the intermediate catheter, with free blood return confirmed. Ace aspiration catheter was removed. Control angiogram was again performed. 035 rose in wire was then advanced through the neuron max into the cervical segment. The neuron max was withdrawn proximal to the bifurcation with repeat angiogram performed. At this point, the results and evolution of the case as well as the patient management was thoroughly discussed with the neurology team. We elected at this time to perform flat panel CT. After the results demonstrated presence of subarachnoid/ICH, we elected not to place a carotid stent. The neuro on max was removed. Completion cerebral angiogram was then performed of the right carotid system and the bilateral vertebral arteries. Catheter was removed, and the bright tip sheath was exchanged for a short 8 French sheath at the right common femoral artery. Eight [place] Angio-Seal was deployed for hemostasis. Patient tolerated the procedure well and remained hemodynamically stable throughout. No complications were encountered. IMPRESSION: Status post ultrasound guided access right common femoral artery for cerebral angiogram and mechanical thrombectomy of left MCA ELVO, achieving TICI 2b reperfusion after 3 passes with a combination of ADAPT technique and SOLUMBRA technique using a 4x40 solitaire. Status post  deployment of Angio-Seal for hemostasis at the access  site. Cerebral angiogram confirms acute left ICA dissection as the source of the intracranial emboli, with cervical ICA occluded at the case conclusion. Signed, Dulcy Fanny. Dellia Nims, RPVI Vascular and Interventional Radiology Specialists [facility] Radiology PLAN: Given the presence of hemorrhage, the patient will stay intubated. Straight to CT for baseline CT scan. Right common femoral artery has been closed with Angio-Seal. Local wound care and right hip straight for 4 hours. The patient will be admitted to the neuro ICU. Target blood pressure goal of less than 294 systolic Electronically Signed   By: Corrie Mckusick D.O.   On: 02/20/2018 14:20   Ir Angio Vertebral Sel Vertebral Bilat Mod Sed  Result Date: 02/20/2018 INDICATION: 37 year old male with a history of acute left MCA stroke Given the patient's age and baseline function, thrombectomy was offered to the patient's family as a possibility of improving his overall function and giving him the best chance of recovery, accepting a high risk of intracranial hemorrhage/malignant edema given the size of the infarction. EXAM: ULTRASOUND GUIDED ACCESS RIGHT COMMON FEMORAL ARTERY CEREBRAL ANGIOGRAM MECHANICAL THROMBECTOMY LEFT MCA CLOSURE OF ACCESS SITE WITH ANGIO-SEAL DEVICE COMPARISON:  CT 02/19/2018, CT ANGIOGRAM 02/19/2018 MEDICATIONS: 2 g Ancef. The antibiotic was administered within 1 hour of the procedure ANESTHESIA/SEDATION: GENERAL ENDOTRACHEAL TUBE ANESTHESIA WITH THE ANESTHESIA TEAM CONTRAST:  140 cc Isovue FLUOROSCOPY TIME:  Fluoroscopy Time: 35 minutes 6 seconds (2373 mGy). COMPLICATIONS: None TECHNIQUE: Informed written consent was obtained from the patient's family after a thorough discussion of the procedural risks, benefits and alternatives. Specific risks discussed include: Bleeding, infection, contrast reaction, kidney injury/failure, need for further procedure/surgery, arterial injury or  dissection, embolization to new territory, intracranial hemorrhage (10-15% risk), neurologic deterioration, cardiopulmonary collapse, death. All questions were addressed. Maximal Sterile Barrier Technique was utilized including during the procedure including caps, mask, sterile gowns, sterile gloves, sterile drape, hand hygiene and skin antiseptic. A timeout was performed prior to the initiation of the procedure. The anesthesia team was present to provide general endotracheal tube anesthesia and for patient monitoring during the procedure. Interventional neuro radiology nursing staff was also present. FINDINGS: Ultrasound images were performed of the right inguinal region, confirming patency of the right common femoral artery. Images were stored and sent to PACs. Initial Findings: Left common carotid artery:  Normal course caliber and contour. Left external carotid artery: Patent with antegrade flow. Left internal carotid artery: There is irregular appearance of the posterior proximal internal carotid artery extending from the carotid bulb along the proximal 3rd of the right internal carotid artery. This results in at least 50 percent narrowing, with irregular blunted appearance on the distal filling defect, compatible with thrombus/dissection. The mid and distal cervical segment of the ICA is patent. Unremarkable petrous segment, lacerum segment, cavernous segment, clinoid segment/ophthalmic segment and terminal segment. Left MCA: MCA occluded at the origin with a reversed meniscus suggesting acute thrombus. Patent anterior choroidal artery and a small posterior communicating artery, which washes out from collateral flow from the posterior. The initial injection demonstrates patent meningo-hypophyseal branch, with sellar blush. Left ACA: A 1 segment patent. A 2 segment perfuses the left territory. Patent anterior communicating artery with transient perfusion of the right ACA territory. Completion Findings: Left  MCA: After mechanical thrombectomy, there is reperfusion of the middle cerebral artery, with slow flow through the insular branches, TICI 2b. Early venous drainage is visualized. Upon withdrawal into the common carotid artery, there is occlusion of the proximal cervical ICA secondary to acute dissection/thrombus. External carotid artery  remains patent, with early perfusion changes of external to internal collateralization, as there is filling of the carotid siphon via ascending cervical. Right common carotid artery:  Normal course caliber and contour. Right external carotid artery: Patent with antegrade flow. Right internal carotid artery: Normal course caliber and contour of the cervical portion. Vertical and petrous segment patent with normal course caliber contour. Lacerum segment patent. cavernous segment patent. Clinoid segment patent. Antegrade flow of the ophthalmic artery. Ophthalmic segment patent. Terminus patent. Crossover flow into the left hemisphere, with perfusion of both the ACA territory and the MCA territory at the conclusion. Right MCA: M1 segment patent. Insular and opercular segments patent. Unremarkable caliber and course of the cortical segments. Typical arterial, capillary/ parenchymal, and venous phase. Right ACA: A 1 segment patent. A 2 segment perfuses the right territory. Right vertebral: Cervical vertebral artery have normal course caliber and contour with expected filling of the C3, C2, C1 branches. The dominant runoff of the right vertebral artery is into a right posterior inferior cerebellar artery, with a small right vertebral artery to basilar contribution. The majority of flow into the basilar from the right is washed out from a left vertebral artery inflow. Left vertebral: Normal course caliber and contour of left vertebral artery. Dominant left vertebral artery. Expected filling of the C3, C2, C1 branches. Dominant vertebral artery contributes to basilar artery, with patent left  posterior inferior cerebellar artery, patent bilateral superior cerebellar artery, and patent bilateral posterior cerebral artery, with symmetric opacification from the left vertebral injection. The left posterior cerebral artery contributes to perfusion of the left temporal lobe and occipital lobe. Flat panel CT: CT demonstrates no significant mass effect, however, at the conclusion there is evidence of contrast staining of the left MCA territory gyri as well as a combination of ICH/SAH at the left basal ganglia and sylvian fissure. Evidence of involving left MCA infarction. PROCEDURE: Patient is brought emergently to the neuro angiography suite, with the patient identified appropriately and placed supine position on the table. Left radial arterial line was placed by the anesthesia team. The patient is then prepped and draped in the usual sterile fashion. Ultrasound survey of the right inguinal region was performed with images stored and sent to PACs, confirming patency of the common femoral artery. 11 blade scalpel was used to make a small incision. Blunt dissection was performed. A micropuncture needle was used access the right common femoral artery under ultrasound. With excellent arterial blood flow returned, an .018 micro wire was passed through the needle, observed to enter the abdominal aorta under fluoroscopy. The needle was removed, and a micropuncture sheath was placed over the wire. The inner dilator and wire were removed, and an 035 Bentson wire was advanced under fluoroscopy into the abdominal aorta. The sheath was removed and a standard 5 [place] vascular sheath was placed. The dilator was removed and the sheath was flushed. A 26F JB-1 diagnostic catheter was advanced over the wire to the proximal descending thoracic aorta. Wire was then removed. Double flush of the catheter was performed. Catheter was then used to select the left common carotid artery. Formal angiogram was performed of the cervical and  cerebral vessels. Glidewire was then used to navigate across the dissected proximal cervical ICA. Diagnostic catheter was then advanced into the distal cervical ICA. Exchange length Rosen wire was then passed through the diagnostic catheter to the distal cervical ICA and the diagnostic catheter was removed. The 5 French sheath was removed and exchanged for 8 [place]  55 centimeter BrightTip sheath. Sheath was flushed and attached to pressurized and heparinized saline bag for constant forward flow. Then a neuron Max 80cm catheter was prepared on the back table. Ace 68 intermediate catheter was then loaded though the Neuron Max catheter with coaxial microcatheter, and advanced through the guide catheter. Under roadmap angiogram, Microcatheter system was then introduced through the Ace catheter using a synchro soft 014 wire and a Trevo Provue18 catheter. Microcatheter system was advanced into the internal carotid artery, to the level of the occlusion. The micro wire and the microcatheter were carefully placed at the proximal occlusion, and the aspiration catheter was advanced to the occlusion. Microwire in the microcatheter were withdrawn, an aspiration was performed. Aspiration was initiated, confirming return of blood flow. The catheter was then advanced into the occlusion with stasis of blood flow returned. With no blood flow returned after 30 seconds-60 seconds of aspiration, the aspiration catheter was removed entirely from the neuron max. This catheter was then flushed, with clot retrieved. Repeat angiogram performed. Further targets with thrombus were identified within M2 branches. The aspiration catheter was then used as a intermediate catheter, with the microcatheter advanced with the synchro wire. Under road map angiogram, the microcatheter and microwire were advanced beyond the M2 thrombus. The wire was removed and gentle aspiration was performed confirming blood flow returned. Gentle injection was performed  to prove intraluminal location. A rotating hemostatic valve was then attached to the back end of the microcatheter, and a pressurized and heparinized saline bag was attached to the catheter. 4 x 40 solitaire device was then selected. Back flush was achieved at the rotating hemostatic valve, and then the device was gently advanced through the microcatheter to the distal end. The retriever was then unsheathed by withdrawing the microcatheter under fluoroscopy. Once the retriever was completely unsheathed, control angiogram was performed from the balloon catheter. Constant aspiration was then performed through the Ace catheter as the retriever was gently and slowly withdrawn with fluoroscopic observation. Once the retriever was entirely removed from the system, free aspiration was confirmed at the hub of the intermediate catheter, with free blood return confirmed. Control angiogram was then performed. Persistent occlusion at the proximal M2 branch was confirmed. The microcatheter system was then again advanced through the Ace catheter. Once the micro wire microcatheter were beyond the M2 occlusion, the micro wire was removed and solitaire 4 x 40 device was deployed. After the solitaire was deployed across the occlusion, the Ace catheter was advanced into the M1 segment at the site of the occlusion. Local aspiration was performed upon withdrawal of the solitaire device under fluoroscopic observation. Once the retrieve her was entirely removed from the system, free aspiration was confirmed at the hub of the intermediate catheter, with free blood return confirmed. Ace aspiration catheter was removed. Control angiogram was again performed. 035 rose in wire was then advanced through the neuron max into the cervical segment. The neuron max was withdrawn proximal to the bifurcation with repeat angiogram performed. At this point, the results and evolution of the case as well as the patient management was thoroughly discussed  with the neurology team. We elected at this time to perform flat panel CT. After the results demonstrated presence of subarachnoid/ICH, we elected not to place a carotid stent. The neuro on max was removed. Completion cerebral angiogram was then performed of the right carotid system and the bilateral vertebral arteries. Catheter was removed, and the bright tip sheath was exchanged for a short 8 [place]  sheath at the right common femoral artery. Eight [place] Angio-Seal was deployed for hemostasis. Patient tolerated the procedure well and remained hemodynamically stable throughout. No complications were encountered. IMPRESSION: Status post ultrasound guided access right common femoral artery for cerebral angiogram and mechanical thrombectomy of left MCA ELVO, achieving TICI 2b reperfusion after 3 passes with a combination of ADAPT technique and SOLUMBRA technique using a 4x40 solitaire. Status post deployment of Angio-Seal for hemostasis at the access site. Cerebral angiogram confirms acute left ICA dissection as the source of the intracranial emboli, with cervical ICA occluded at the case conclusion. Signed, Dulcy Fanny. Dellia Nims, RPVI Vascular and Interventional Radiology Specialists [facility] Radiology PLAN: Given the presence of hemorrhage, the patient will stay intubated. Straight to CT for baseline CT scan. Right common femoral artery has been closed with Angio-Seal. Local wound care and right hip straight for 4 hours. The patient will be admitted to the neuro ICU. Target blood pressure goal of less than 606 systolic Electronically Signed   By: Corrie Mckusick D.O.   On: 02/20/2018 14:20    Labs:  CBC: Recent Labs    02/19/18 1215 02/19/18 1219 02/20/18 0547 02/21/18 0539 02/22/18 0516  WBC 15.4*  --  19.5* 14.2* 18.3*  HGB 18.0* 18.0* 15.2 15.1 14.5  HCT 48.7 53.0* 43.0 44.2 42.0  PLT 265  --  174 157 197    COAGS: Recent Labs    02/19/18 1215  INR 1.02  APTT 29    BMP: Recent Labs     02/20/18 0547  02/21/18 0539  02/22/18 0516 02/22/18 1043 02/22/18 1745 02/22/18 2252 02/23/18 0542  NA 142   < > 150*   < > 157* 157* 158* 161* 160*  K 4.0  --  3.4*  --  3.2*  --   --   --  3.3*  CL 110  --  118*  --  121*  --   --   --  126*  CO2 23  --  25  --  27  --   --   --  27  GLUCOSE 133*  --  120*  --  127*  --   --   --  113*  BUN 5*  --  12  --  5*  --   --   --  12  CALCIUM 7.9*  --  8.0*  --  8.5*  --   --   --  8.6*  CREATININE 0.71  --  0.70  --  0.65  --   --   --  0.80  GFRNONAA >60  --  >60  --  >60  --   --   --  >60  GFRAA >60  --  >60  --  >60  --   --   --  >60   < > = values in this interval not displayed.    LIVER FUNCTION TESTS: Recent Labs    02/19/18 1215  BILITOT 1.1  AST 28  ALT 33  ALKPHOS 96  PROT 7.2  ALBUMIN 3.4*    Assessment and Plan:  Left MCA ELVO s/p thrombectomy 02/19/2018 with Dr. Earleen Newport. Patient's condition improving- still with spontaneous movements of left extremities, right extremities withdraw from pain, but grossly aphasic. Right groin incision stable. Appreciate and agree with neurology management.   Electronically Signed: Earley Abide, PA-C 02/23/2018, 10:30 AM   I spent a total of 15 Minutes at the the patient's bedside AND on the patient's hospital  floor or unit, greater than 50% of which was counseling/coordinating care for left MCA ELVO s/p thrombectomy.

## 2018-02-24 ENCOUNTER — Inpatient Hospital Stay (HOSPITAL_COMMUNITY): Payer: BLUE CROSS/BLUE SHIELD

## 2018-02-24 DIAGNOSIS — I330 Acute and subacute infective endocarditis: Secondary | ICD-10-CM

## 2018-02-24 LAB — BLOOD CULTURE ID PANEL (REFLEXED)
Acinetobacter baumannii: NOT DETECTED
CANDIDA GLABRATA: NOT DETECTED
CANDIDA KRUSEI: NOT DETECTED
Candida albicans: NOT DETECTED
Candida parapsilosis: NOT DETECTED
Candida tropicalis: NOT DETECTED
ENTEROCOCCUS SPECIES: NOT DETECTED
ESCHERICHIA COLI: NOT DETECTED
Enterobacter cloacae complex: NOT DETECTED
Enterobacteriaceae species: NOT DETECTED
Haemophilus influenzae: NOT DETECTED
Klebsiella oxytoca: NOT DETECTED
Klebsiella pneumoniae: NOT DETECTED
LISTERIA MONOCYTOGENES: NOT DETECTED
Methicillin resistance: NOT DETECTED
NEISSERIA MENINGITIDIS: NOT DETECTED
PROTEUS SPECIES: NOT DETECTED
Pseudomonas aeruginosa: NOT DETECTED
SERRATIA MARCESCENS: NOT DETECTED
STAPHYLOCOCCUS AUREUS BCID: NOT DETECTED
STAPHYLOCOCCUS SPECIES: DETECTED — AB
STREPTOCOCCUS AGALACTIAE: NOT DETECTED
Streptococcus pneumoniae: NOT DETECTED
Streptococcus pyogenes: NOT DETECTED
Streptococcus species: NOT DETECTED

## 2018-02-24 LAB — BASIC METABOLIC PANEL
ANION GAP: 7 (ref 5–15)
BUN: 18 mg/dL (ref 6–20)
CALCIUM: 8.8 mg/dL — AB (ref 8.9–10.3)
CO2: 27 mmol/L (ref 22–32)
Chloride: 124 mmol/L — ABNORMAL HIGH (ref 98–111)
Creatinine, Ser: 0.69 mg/dL (ref 0.61–1.24)
GLUCOSE: 111 mg/dL — AB (ref 70–99)
Potassium: 3.5 mmol/L (ref 3.5–5.1)
Sodium: 158 mmol/L — ABNORMAL HIGH (ref 135–145)

## 2018-02-24 LAB — CBC
HCT: 36.8 % — ABNORMAL LOW (ref 39.0–52.0)
Hemoglobin: 12.6 g/dL — ABNORMAL LOW (ref 13.0–17.0)
MCH: 39 pg — ABNORMAL HIGH (ref 26.0–34.0)
MCHC: 34.2 g/dL (ref 30.0–36.0)
MCV: 113.9 fL — ABNORMAL HIGH (ref 78.0–100.0)
PLATELETS: 199 10*3/uL (ref 150–400)
RBC: 3.23 MIL/uL — ABNORMAL LOW (ref 4.22–5.81)
RDW: 13.4 % (ref 11.5–15.5)
WBC: 14.7 10*3/uL — AB (ref 4.0–10.5)

## 2018-02-24 LAB — BARTONELLA ANITBODY PANEL: B QUINTANA IGG: NEGATIVE {titer}

## 2018-02-24 LAB — BARTONELLA ANTIBODY PANEL
B Quintana IgM: NEGATIVE titer
B henselae IgG: NEGATIVE titer
B henselae IgM: NEGATIVE titer

## 2018-02-24 LAB — SODIUM
SODIUM: 160 mmol/L — AB (ref 135–145)
SODIUM: 156 mmol/L — AB (ref 135–145)
Sodium: 156 mmol/L — ABNORMAL HIGH (ref 135–145)

## 2018-02-24 LAB — PROCALCITONIN: PROCALCITONIN: 0.11 ng/mL

## 2018-02-24 MED ORDER — SODIUM CHLORIDE 0.9 % IV SOLN
1500.0000 mg | Freq: Once | INTRAVENOUS | Status: AC
  Administered 2018-02-24: 1500 mg via INTRAVENOUS
  Filled 2018-02-24: qty 1500

## 2018-02-24 MED ORDER — CEFTRIAXONE SODIUM 2 G IJ SOLR
2.0000 g | INTRAMUSCULAR | Status: DC
  Administered 2018-02-24: 2 g via INTRAVENOUS
  Filled 2018-02-24 (×2): qty 20

## 2018-02-24 MED ORDER — VANCOMYCIN HCL IN DEXTROSE 1-5 GM/200ML-% IV SOLN
1000.0000 mg | Freq: Three times a day (TID) | INTRAVENOUS | Status: DC
  Administered 2018-02-24 – 2018-03-01 (×14): 1000 mg via INTRAVENOUS
  Filled 2018-02-24 (×14): qty 200

## 2018-02-24 NOTE — Progress Notes (Signed)
  Speech Language Pathology Treatment: Dysphagia;Cognitive-Linquistic  Patient Details Name: Xavier Villegas MRN: [id number] DOB: 09/27/1980 Today's Date: 02/24/2018 Time: 5038-8828 SLP Time Calculation (min) (ACUTE ONLY): 36 min  Assessment / Plan / Recommendation Clinical Impression  Pt making excellent progress today, able to attend to meal with moderate verbal and visual cues for self feeding. Pt demonstrates apraxic behavior with left UE; putting the wrong end of objects in his mouth, groping with hand, picking up incorrect utensils and putting them down, visibly problem solving with self feeding. He is able to follow one step commands fairly consistently to correct. He can attend right with verbal cues, but turns his head to look through the side of his eyes indicating a possible field cut. He responds to Y/N questions with a head nod, but cannot coordinate a head shake for no, max cueing provided but pt could not carry over to communicate. Attempted MIT interventions to initiate speech, pt opens mouth, hoarse phonation x1, again appears consistent with apraxic groping. Pt is tolerating nectar thick liquids and purees well, one delayed cough likely indicative of residue. Pt to continue current diet, will address advancement of solids tomorrow.    HPI HPI:  This is a 37 year old who presented with a right hemiplegia who was found to have a left carotid dissection as well as an MCA thrombosis.  A thrombectomy was performed however this appears to have reclosed on a follow-up MRI.  He has evolved a large left MCA territory infarct with hemorrhagic transformation. Intubated from 7/13-7/16, self extubated.       SLP Plan  Continue with current plan of care       Recommendations  Diet recommendations: Nectar-thick liquid;Dysphagia 1 (puree) Liquids provided via: Cup;Straw Medication Administration: Whole meds with puree Supervision: Staff to assist with self feeding Compensations:  Slow rate;Small sips/bites;Minimize environmental distractions                Follow up Recommendations: Inpatient Rehab SLP Visit Diagnosis: Dysphagia, oropharyngeal phase (R13.12) Plan: Continue with current plan of care       GO               [facility], [place] CCC-SLP [phone]  Xavier Villegas 02/24/2018, 10:14 AM

## 2018-02-24 NOTE — Progress Notes (Signed)
Inpatient Rehabilitation Admissions Coordinator  I met with patient at bedside with his girlfriend, Elenor Legato, cousin and best friend present. Briefly assessed pt. I left a voicemail for pt's Mom to call me to begin discussions concerning his rehab needs. I will follow.  Danne Baxter, RN, MSN Rehab Admissions Coordinator [phone] 02/24/2018 11:55 AM

## 2018-02-24 NOTE — Progress Notes (Signed)
Pharmacy Antibiotic Note  Xavier Villegas is a 37 y.o. male admitted on 02/19/2018 with aortic valve endocarditis.  Pharmacy has been consulted for Vancomycin dosing. Also on Ceftriaxone. TTE with vegetation on aortic valve.   WBC is elevated. Patient is afebrile. SCr is stable at 0.69. UOP 0.5 mL/kg/hr.   Plan: Vancomycin 1500mg  IV x1 then 1000mg  IV every 8 hours.  Goal trough 15-20 mcg/mL.  Ceftriaxone dose of 2g IV every 24 hours appropriate for source.  Monitor renal function, clinical status, and culture results.  Will monitor vancomycin trough levels as appropriate.   Height: 6' (182.9 cm) Weight: 182 lb 8.7 oz (82.8 kg) IBW/kg (Calculated) : 77.6  Temp (24hrs), Avg:98.7 F (37.1 C), Min:98.3 F (36.8 C), Max:99.4 F (37.4 C)  Recent Labs  Lab 02/19/18 1215  02/20/18 0547 02/21/18 0539 02/22/18 0516 02/23/18 0542 02/24/18 0521  WBC 15.4*  --  19.5* 14.2* 18.3*  --  14.7*  CREATININE 0.73   < > 0.71 0.70 0.65 0.80 0.69   < > = values in this interval not displayed.    Estimated Creatinine Clearance: 141.5 mL/min (by C-G formula based on SCr of 0.69 mg/dL).    No Known Allergies  Antimicrobials this admission: Vancomycin 7/18 >> Ceftriaxone 7/18 >>  Dose adjustments this admission:   Microbiology results: 7/13 MRSA PCR - positive 7/14 Blood - ngtd x3 days 7/17 Blood >>  Thank you for allowing pharmacy to be a part of this patient's care.  Sloan Leiter, PharmD, BCPS, BCCCP Clinical Pharmacist Clinical phone 02/24/2018 until 10PM [phone] Please refer to [facility] and [place] for clinical pharmacist numbers. 02/24/2018 3:51 PM

## 2018-02-24 NOTE — Progress Notes (Signed)
STROKE TEAM PROGRESS NOTE   SUBJECTIVE (INTERVAL HISTORY) His mom and girlfriend are at the bedside.  Pt is sitting in chair and able to have nectar thick liquid. He is awake and able to follow some peripheral commands, neuro stable from yesterday. CT repeat showed stable cerebral edema from yesterday. Na 158.   OBJECTIVE Temp:  [98.4 F (36.9 C)-99.6 F (37.6 C)] 98.4 F (36.9 C) (07/18 0800) Pulse Rate:  [49-103] 52 (07/18 1100) Cardiac Rhythm: Sinus bradycardia (07/18 0800) Resp:  [15-22] 15 (07/18 1100) BP: (100-165)/(68-117) 118/71 (07/18 1100) SpO2:  [93 %-99 %] 96 % (07/18 1100)  CBC:  Recent Labs  Lab 02/20/18 0547  02/22/18 0516 02/24/18 0521  WBC 19.5*   < > 18.3* 14.7*  NEUTROABS 17.4*  --  15.2*  --   HGB 15.2   < > 14.5 12.6*  HCT 43.0   < > 42.0 36.8*  MCV 109.1*   < > 112.0* 113.9*  PLT 174   < > 197 199   < > = values in this interval not displayed.    Basic Metabolic Panel:  Recent Labs  Lab 02/21/18 1702  02/22/18 0516  02/23/18 0542  02/23/18 2326 02/24/18 0521  NA 157*   < > 157*   < > 160*   < > 160* 158*  K  --   --  3.2*  --  3.3*  --   --  3.5  CL  --   --  121*  --  126*  --   --  124*  CO2  --   --  27  --  27  --   --  27  GLUCOSE  --   --  127*  --  113*  --   --  111*  BUN  --   --  5*  --  12  --   --  18  CREATININE  --   --  0.65  --  0.80  --   --  0.69  CALCIUM  --   --  8.5*  --  8.6*  --   --  8.8*  MG 1.8  --  2.0  --  2.1  --   --   --   PHOS 2.9  --   --   --  3.0  --   --   --    < > = values in this interval not displayed.    Lipid Panel:     Component Value Date/Time   CHOL 101 02/20/2018 0547   TRIG 94 02/22/2018 1745   HDL 34 (L) 02/20/2018 0547   CHOLHDL 3.0 02/20/2018 0547   VLDL 21 02/20/2018 0547   LDLCALC 46 02/20/2018 0547   HgbA1c:  Lab Results  Component Value Date   HGBA1C 4.9 02/20/2018   Urine Drug Screen:     Component Value Date/Time   LABOPIA NONE DETECTED 02/19/2018 1655   COCAINSCRNUR  NONE DETECTED 02/19/2018 1655   LABBENZ NONE DETECTED 02/19/2018 1655   AMPHETMU NONE DETECTED 02/19/2018 1655   THCU NONE DETECTED 02/19/2018 1655   LABBARB (A) 02/19/2018 1655    Result not available. Reagent lot number recalled by manufacturer.    Alcohol Level No results found for: [facility]  IMAGING I have personally reviewed the radiological images below and agree with the radiology interpretations.  Ct Angio Head W Or Wo Contrast Ct Angio Neck W Or Wo Contrast Ct Cerebral Perfusion W Contrast 02/19/2018 IMPRESSION:  Large territory left MCA acute infarct. 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 Occlusion left A1 with reconstitution left A2. Occlusion left M1 and M2 segments.   IR Angiogram Dr Earleen Newport 02/19/2018 3:08 PM Findings:  Left proximal cervical ICA dissection/thrombus Left M1 occlusion First pass, TICI 2a, with emboli in distal M1/M2 Second pass, TICI 2a, with persistent occlusion of M2 branch Third pass, TICI 2b Occlusive left ICA dissection, with patent ACA and maintained cross-flow from right to left Posterior circulation perfuses the left temporal and occipital region Flat panel CT shows SAH/ICH Complications: ICH  Ct Head Wo Contrast 02/19/2018 IMPRESSION:  Left MCA territory infarction with early low-density and swelling but no mass effect or shift at this time. I think most the hyperdensity seen previously relates to contrast staining, much of which is diminishing or resolved. There is some persistent contrast staining in the region of the insula and I could not exclude that there is a small amount of admixed hemorrhage.   Ct Head Wo Contrast 02/19/2018 IMPRESSION:  Large territory acute infarct left MCA territory. Interval development of multiple areas of patchy hemorrhage within the infarct following clot retrieval. No midline shift.    Ct Head Code Stroke Wo Contrast 02/19/2018 IMPRESSION:  1. Hypodensities left frontal lobe and left occipital parietal lobe most compatible with acute infarct. Possible emboli or watershed infarct. Negative for hemorrhage or mass-effect  2. ASPECTS is 8   MRI / MRA Acute infarct of the entire left MCA territory with mild associated hemorrhage in the left parietal and frontal lobe. Diffuse edema. 2 mm midline shift to the right. No other infarct Occlusion of the left internal carotid artery and entire left middle cerebral artery  Transthoracic Echocardiogram - Left ventricle: The cavity size was normal. Wall thickness was   increased in a pattern of mild LVH. Systolic function was normal.   The estimated ejection fraction was in the range of 55% to 60%.   Wall motion was normal; there were no regional wall motion   abnormalities. Left ventricular diastolic function parameters   were normal. - Aortic valve: There was a small, mobile vegetation on the left   ventricular aspect. Impressions: - There is a small - medium sized mobile mass on the LV aspect of   the aortic valve.   The appearence is c/w vegetation.   Aortic valve endocarditis  VAS [place] LOWER EXTREMITY VENOUS (DVT) There is no DVT or SVT noted in the bilateral lower extremities.   CUS  R: no ICA stenosis. The left ICA appears occluded.  Right vertebral artery flow is antegrade.  Left vertebral artery not insonated secondary to patient's positioning on vent.  Ct Head Wo Contrast 02/21/2018 CLINICAL DATA:  Stroke follow-up EXAM: CT HEAD WITHOUT CONTRAST TECHNIQUE: Contiguous axial images were obtained from the base of the skull through the vertex without intravenous contrast. COMPARISON:  Head CT 02/19/2018 FINDINGS: Brain: There is increased hypodensity throughout the left MCA distribution in the location of the known infarct. Hyperdense material within the infarcted region has also decreased in density, with no increased  volume. Mass effect on the left lateral ventricle is increased and there is now rightward midline shift measuring 4 mm at the level of the foramina of [place]. No hydrocephalus. Right hemisphere and cerebellum are normal. Vascular: Hyperattenuation of the left middle cerebral artery. Skull: The visualized skull base, calvarium and extracranial soft tissues  are normal. Sinuses/Orbits: No fluid levels or advanced mucosal thickening of the visualized paranasal sinuses. No mastoid or middle ear effusion. The orbits are normal. IMPRESSION: 1. Progression of cytotoxic edema throughout the left MCA distribution, causing slight mass effect on the left lateral ventricle with 4 mm of rightward midline shift. No hydrocephalus. 2. Decreased density in unchanged distribution of hyperattenuating material in the left MCA territory, favored to indicate resolving contrast staining. Electronically Signed   By: Ulyses Jarred M.D.   On: 02/21/2018 05:15   Ct Head Wo Contrast  Result Date: 02/23/2018 CLINICAL DATA:  37 y/o  M; decompressive craniectomy. EXAM: CT HEAD WITHOUT CONTRAST TECHNIQUE: Contiguous axial images were obtained from the base of the skull through the vertex without intravenous contrast. COMPARISON:  02/21/2018 CT head.  02/20/2018 MRI of the head. FINDINGS: Brain: Severe progressed edema throughout the left MCA infarction. Further decrease in attenuation of hyperdense material in the center of the infarct. Mass effect from edema has increased with mild herniation of the hemisphere through the craniectomy, partial effacement of left lateral ventricle, and 5 mm of left-to-right midline shift. There is slight interval dilatation of the right lateral ventricle which may represent developing entrapment. There is a small volume of acute extra-axial hemorrhage posterior cranial margin compatible postoperative change. Vascular: Density within left internal carotid artery and MCA compatible with thrombosis. Skull: Interval  left lateral craniectomy with extensive edema throughout the left scalp, periorbital compartment, and face, several small foci of air, and a small air-fluid level compatible postoperative changes. Sinuses/Orbits: Mild mucosal thickening within the sphenoid sinus and the right maxillary sinus. Normal aeration of mastoid air cells. Orbital compartments are unremarkable. Other: None. IMPRESSION: 1. Interval postsurgical changes related to left hemi craniectomy. 2. Increased edema and mass effect associated with the left MCA infarct with mild herniation of the left hemisphere through the craniectomy, further effacement of left lateral ventricle, and 5 mm left-to-right midline shift. 3. Slight interval increased dilatation of right lateral ventricle may represent entrapment, attention at follow-up recommended. Electronically Signed   By: Kristine Garbe M.D.   On: 02/23/2018 06:05   Dg Chest Port 1 View  Result Date: 02/23/2018 CLINICAL DATA:  Respiratory failure EXAM: PORTABLE CHEST 1 VIEW COMPARISON:  02/21/2018 FINDINGS: Left central line remains in place, unchanged. Interval removal of NG tube and endotracheal tube. Heart is upper limits normal in size. Platelike atelectasis in the right lower lung. Left lung clear. No effusions. IMPRESSION: Continued right lower lung atelectasis. Electronically Signed   By: Rolm Baptise M.D.   On: 02/23/2018 10:46   Ct Head Wo Contrast  Result Date: 02/24/2018 CLINICAL DATA:  36 y/o  M; stroke for follow-up. EXAM: CT HEAD WITHOUT CONTRAST TECHNIQUE: Contiguous axial images were obtained from the base of the skull through the vertex without intravenous contrast. COMPARISON:  02/23/2018 CT head. FINDINGS: Brain: Stable left MCA distribution infarction, associated mass effect, mild herniation through the left craniectomy, partial effacement of left lateral ventricle, and 5 mm left-to-right midline shift. Stable slightly distended right lateral ventricle. Further  decrease in density high attenuation material within the left MCA distribution infarction. Stable small volume of extra-axial hemorrhage at the site of the craniectomy. No new stroke or hemorrhage identified. No downward herniation. Vascular: High attenuation left internal carotid artery and MCA compatible with thrombus. Skull: Postsurgical changes related to a left hemi craniectomy with stable edema and pneumatosis of the overlying scalp as well as skin staples. Sinuses/Orbits: No acute finding. Other: Mild  right maxillary sinus and sphenoid sinus mucosal thickening. Normal aeration of mastoid air cells. Orbital compartments are unremarkable. IMPRESSION: 1. Stable left MCA distribution infarction, mass effect, herniation through the left hemi craniectomy, effacement of left lateral ventricle, and 5 mm left-to-right midline shift. Stable slight dilatation of right lateral ventricle. 2. No new acute intracranial abnormality identified. Electronically Signed   By: Kristine Garbe M.D.   On: 02/24/2018 05:17    PHYSICAL EXAM Temp:  [98.4 F (36.9 C)-99.6 F (37.6 C)] 98.4 F (36.9 C) (07/18 0800) Pulse Rate:  [49-103] 52 (07/18 1100) Resp:  [15-22] 15 (07/18 1100) BP: (100-165)/(68-117) 118/71 (07/18 1100) SpO2:  [93 %-99 %] 96 % (07/18 1100)  General - Well nourished, well developed, lethargic.  Ophthalmologic - fundi not visualized due to noncooperation and right eyelid swollen.  Cardiovascular - Regular rate and rhythm.  Neuro - sitting in chair. Both eyes open with left eye slightly smaller due to soft tissue edema. PERRL. Left gaze preference, barely cross midline. Able to follow all central commands and some peripheral command on the left hand. Not blinking to visual threat on the right. Right facial droop. LUE and LLE spontaneous movement against gravity. RUE 1/5 with increased tone and RLE 2/5 on pain stimulation. DTR 1+ and right babinski positive. Sensation, coordination and gait  not tested.   ASSESSMENT/PLAN Mr. Anup Brigham is a 37 y.o. male with no significant past medical history other than tobacco use presenting with right-sided weakness and aphasia. He did not receive IV t-PA due to late presentation.  Attempted thrombectomy with subsequent left ICA dissection and possible ICH.  Stroke: large left MCA territory infarct due to left ICA and MCA occlusion, etiology unclear, could be due to ICA dissection or aortic valve vegetation shown on TTE   Resultant  Intubated, right hemiplegia, aphasia  CT head - Large territory acute infarct left MCA territory.  IR - occlusive left ICA but TICI2b left MCA  MRI head - large left MCA infarct with small ICH/SAH hemorrhagic conversion  MRA head - left ICA and MCA remain occluded  CTA H&N - Acute dissection left internal carotid artery with intraluminal thrombus  LE Venous Dopplers - no DVT  Carotid Doppler - left ICA occluded  2D Echo - EF 55-60% and aortic valve mass vs. vegetation  consider TEE early next week  Hypercoagulable work up negative except high homocysteine at 181.7. MRHFR analysis pending  LDL - 46  HgbA1c - 4.9  VTE prophylaxis - subq heparin  No antithrombotic prior to admission, now on ASA.   Ongoing aggressive stroke risk factor management  Therapy recommendations:  CIR  Disposition:  Pending  Cerebral edema  MRA large left MCA infarct with 75mm midline shift 02/20/18  Repeat CT showed mass effect with 78mm midline shift 02/21/18  CT repeat showed increased mass effect to 41mm midline shift 02/23/18  CT repeat stable mass effect with midline shift at 1mm 02/24/18  Off 3% saline  Na goal 150-160  Na Q6h  S/p [facility] - mental status much improved  Aortic valve mass vs. vegetation  TTE concerning for AV vegetation vs. mass  Blood culture negative  Will repeat blood culture  WBC 15.4->19.5->14.2->18.3  Tmax 100.6->97.2->100.9->afebrile  Cardiology consult  appreciated  Consider TEE early next week  ID recs appreciated  Severe hyperhomocysteinemia - associated with low FA, B12 and smoking  Homocysteine 181.7  Low FA level - 1.5 - on FA supplement  Low normal B12 level - 224 -  supplementation ordered  Smoker   On supplement of FA and B12 and B6  Check B6 level in am  MRHFR mutation pending  Left ICA dissection ??  Girlfriend stated that he bumped his head to window 3 weeks ago  Left ICA occlusion   Tobacco abuse  Current smoker  Smoking cessation counseling will be provided  Other Stroke Risk Factors  ETOH use, advised to drink no more than 1 alcoholic beverage per day.  Family hx of stroke (grandparents)  Other [facility] day # 5  This patient is critically ill due to large left MCA infarct with hemorrhagic conversion, endocarditis, smoker and at significant risk of neurological worsening, death form recurrent stroke, hemorrhage, heart failure, seizure, brain herniation. This patient's care requires constant monitoring of vital signs, hemodynamics, respiratory and cardiac monitoring, review of multiple databases, neurological assessment, discussion with family, other specialists and medical decision making of high complexity. I spent 40 minutes of neurocritical care time in the care of this patient. I had long discussion with mom and girlfriend at bedside, updated pt current condition, treatment plan and potential prognosis. They expressed understanding and appreciation.   Rosalin Hawking, MD PhD Stroke Neurology 02/24/2018 11:43 AM  To contact Stroke Continuity provider, please refer to [email or web address] After hours, contact General Neurology

## 2018-02-24 NOTE — Progress Notes (Signed)
Inpatient Rehabilitation Admissions Coordinator  I met with patient's parents in conference room to discuss goals and expectations of an inpt rehab admit. Mom has recently returned to work after having extensive back surgery. She and her husband do not have FMLA available to not work. I discussed the need to identify caregiver support that pt will need 24/7 min physical assist at d/c and will not be expected to reach an independent level to d/c home alone when they are working. I encouraged his parents to discuss with pt's girlfriend and other family and friends to put together a caregiver network for home d/c. I will follow pt's progress.  Danne Baxter, RN, MSN Rehab Admissions Coordinator [phone] 02/24/2018 3:35 PM

## 2018-02-24 NOTE — Plan of Care (Signed)
Patient had FEES done yesterday and was able to be cleared for Dysphagia 1 Diet.  Speech will continue to follow to determine when patient can have diet changed.  Katherine Mantle RN

## 2018-02-24 NOTE — Progress Notes (Addendum)
    [facility] for Infectious Disease   Reason for visit: Follow up on vegetation  Interval History: blood cultures remain negative  Physical Exam: Constitutional:  Vitals:   02/24/18 1500 02/24/18 1520  BP: 139/72   Pulse: 80   Resp: 15   Temp:  98.3 F (36.8 C)  SpO2: 95%    patient appears in NAD  Impression: vegetation on aortic valve.  I have discussed with Dr. Johnsie Cancel and no TEE planned in the near future, felt to be true vegetation, therefore will start empiric treatment with vancomycin and ceftriaxone.    Plan: 1.  Start vancomycin and ceftriaxone.

## 2018-02-24 NOTE — Progress Notes (Signed)
Physical Therapy Treatment Patient Details Name: Xavier Villegas MRN: [id number] DOB: [birth year] Today's Date: 02/24/2018    History of Present Illness 37 yo found down by girlfriend 7/13 with aphasia and right weakness with Lt MCA infarct with left ICA dissection s/p IR with reocclusion and hemorrhagic transformation with SAH, hemicraniectomy for decompression 7/15, pt with aortic valve vegetation. Pt self-extubated 7/16. No significant PMHx    PT Comments    Patient progressing slowly with lethargy this session and difficulty keeping head up and eyes open.  Did participate with increased activation when verbally cued for prep for transfer to chair.  But tends to loose attention and fatigues quickly with sitting activities.  Family very supportive and encouraged by every little sign of progress.  Will need continued interdisciplinary skilled therapies in CIR setting upon d/c.   Follow Up Recommendations  CIR;Supervision/Assistance - 24 hour     Equipment Recommendations  Wheelchair cushion (measurements PT);Wheelchair (measurements PT);3in1 (PT)    Recommendations for Other Services       Precautions / Restrictions Precautions Precautions: Fall Precaution Comments: bone flap in L abdomen    Mobility  Bed Mobility Overal bed mobility: Needs Assistance Bed Mobility: Rolling;Sidelying to Sit Rolling: Max assist Sidelying to sit: Max assist;+2 for physical assistance       General bed mobility comments: initiated rolling with assist to bend knee and reach with R hand to L; assist for trunk, L LE and scooting to EOB  Transfers Overall transfer level: Needs assistance Equipment used: 2 person hand held assist Transfers: Sit to/from [facility] Sit to Stand: Mod assist;+2 physical assistance         General transfer comment: assist with anterior and L weight shift and R foot position and weight bearing; facilitation through ribs for upright posture and  blocking R knee and hip for L steps to chair and assist to back up with R leg, assist to lower to chair with cues to reach with L hand for armrest  Ambulation/Gait                 Stairs             Wheelchair Mobility    Modified Rankin (Stroke Patients Only) Modified Rankin (Stroke Patients Only) Pre-Morbid Rankin Score: No symptoms Modified Rankin: Severe disability     Balance Overall balance assessment: Needs assistance Sitting-balance support: Single extremity supported;Feet supported Sitting balance-Leahy Scale: Poor Sitting balance - Comments: PT sitting on R side and tech in the back for R UE weight bearing and scapular depression and adduction; family in room and cues for head righting for visualizing family and out window in room; pt leans R and input into ribs for sitting midline Postural control: Right lateral lean Standing balance support: Bilateral upper extremity supported Standing balance-Leahy Scale: Zero Standing balance comment: mod to max A of 2 for standing for R LE support                            Cognition Arousal/Alertness: Lethargic Behavior During Therapy: Flat affect Overall Cognitive Status: Impaired/Different from baseline Area of Impairment: Following commands;Problem solving;Safety/judgement;Attention                   Current Attention Level: Sustained   Following Commands: Follows one step commands with increased time Safety/Judgement: Decreased awareness of deficits   Problem Solving: Slow processing;Decreased initiation;Requires verbal cues;Requires tactile cues  Exercises      General Comments        Pertinent Vitals/Pain Faces Pain Scale: Hurts little more Pain Location: grimaces and holds head at times Pain Descriptors / Indicators: Grimacing    Home Living                      Prior Function            PT Goals (current goals can now be found in the care plan section)  Progress towards PT goals: Progressing toward goals(slowly)    Frequency    Min 4X/week      PT Plan Current plan remains appropriate    Co-evaluation              AM-PAC PT "6 Clicks" Daily Activity  Outcome Measure  Difficulty turning over in bed (including adjusting bedclothes, sheets and blankets)?: Unable Difficulty moving from lying on back to sitting on the side of the bed? : Unable Difficulty sitting down on and standing up from a chair with arms (e.g., wheelchair, bedside commode, etc,.)?: Unable Help needed moving to and from a bed to chair (including a wheelchair)?: Total Help needed walking in hospital room?: Total Help needed climbing 3-5 steps with a railing? : Total 6 Click Score: 6    End of Session Equipment Utilized During Treatment: Gait belt Activity Tolerance: Patient limited by lethargy Patient left: with call bell/phone within reach;in chair;with family/visitor present Nurse Communication: Mobility status PT Visit Diagnosis: Other abnormalities of gait and mobility (R26.89);Hemiplegia and hemiparesis;Other symptoms and signs involving the nervous system (R29.898) Hemiplegia - Right/Left: Right Hemiplegia - dominant/non-dominant: Dominant Hemiplegia - caused by: Cerebral infarction     Time: 6754-4920 PT Time Calculation (min) (ACUTE ONLY): 26 min  Charges:  $Therapeutic Activity: 23-37 mins                    G CodesMagda Villegas, [place] [phone] 02/24/2018    Xavier Villegas 02/24/2018, 12:52 PM

## 2018-02-24 NOTE — Progress Notes (Signed)
PHARMACY - PHYSICIAN COMMUNICATION CRITICAL VALUE ALERT - BLOOD CULTURE IDENTIFICATION (BCID)  Xavier Villegas is an 37 y.o. male who presented to [facility] on 02/19/2018 with a aortic valve endocarditis.  Assessment:  ID following patient with AV endocarditis on TTE. Possible this is contaminant.   Name of physician (or Provider) Contacted: Dr. Johnnye Sima  Current antibiotics: Empiric Vancomycin and Ceftriaxone  Changes to prescribed antibiotics recommended:  Patient is on recommended antibiotics - No changes needed  Results for orders placed or performed during the hospital encounter of 02/19/18  Blood Culture ID Panel (Reflexed) (Collected: 02/23/2018  6:18 PM)  Result Value Ref Range   Enterococcus species NOT DETECTED NOT DETECTED   Listeria monocytogenes NOT DETECTED NOT DETECTED   Staphylococcus species DETECTED (A) NOT DETECTED   Staphylococcus aureus NOT DETECTED NOT DETECTED   Methicillin resistance NOT DETECTED NOT DETECTED   Streptococcus species NOT DETECTED NOT DETECTED   Streptococcus agalactiae NOT DETECTED NOT DETECTED   Streptococcus pneumoniae NOT DETECTED NOT DETECTED   Streptococcus pyogenes NOT DETECTED NOT DETECTED   Acinetobacter baumannii NOT DETECTED NOT DETECTED   Enterobacteriaceae species NOT DETECTED NOT DETECTED   Enterobacter cloacae complex NOT DETECTED NOT DETECTED   Escherichia coli NOT DETECTED NOT DETECTED   Klebsiella oxytoca NOT DETECTED NOT DETECTED   Klebsiella pneumoniae NOT DETECTED NOT DETECTED   Proteus species NOT DETECTED NOT DETECTED   Serratia marcescens NOT DETECTED NOT DETECTED   Haemophilus influenzae NOT DETECTED NOT DETECTED   Neisseria meningitidis NOT DETECTED NOT DETECTED   Pseudomonas aeruginosa NOT DETECTED NOT DETECTED   Candida albicans NOT DETECTED NOT DETECTED   Candida glabrata NOT DETECTED NOT DETECTED   Candida krusei NOT DETECTED NOT DETECTED   Candida parapsilosis NOT DETECTED NOT DETECTED   Candida  tropicalis NOT DETECTED NOT DETECTED    Brain Hilts 02/24/2018  7:49 PM

## 2018-02-25 ENCOUNTER — Encounter (HOSPITAL_COMMUNITY): Payer: Self-pay | Admitting: Neurosurgery

## 2018-02-25 DIAGNOSIS — B957 Other staphylococcus as the cause of diseases classified elsewhere: Secondary | ICD-10-CM

## 2018-02-25 DIAGNOSIS — R130 Aphagia: Secondary | ICD-10-CM

## 2018-02-25 DIAGNOSIS — Z9889 Other specified postprocedural states: Secondary | ICD-10-CM

## 2018-02-25 LAB — BASIC METABOLIC PANEL
Anion gap: 10 (ref 5–15)
BUN: 16 mg/dL (ref 6–20)
CALCIUM: 8.5 mg/dL — AB (ref 8.9–10.3)
CHLORIDE: 120 mmol/L — AB (ref 98–111)
CO2: 26 mmol/L (ref 22–32)
CREATININE: 0.7 mg/dL (ref 0.61–1.24)
GFR calc non Af Amer: >60 mL/min (ref >60)
GLUCOSE: 107 mg/dL — AB (ref 70–99)
Potassium: 3.5 mmol/L (ref 3.5–5.1)
Sodium: 156 mmol/L — ABNORMAL HIGH (ref 135–145)

## 2018-02-25 LAB — CULTURE, BLOOD (ROUTINE X 2)
CULTURE: NO GROWTH
Culture: NO GROWTH
Special Requests: ADEQUATE
Special Requests: ADEQUATE

## 2018-02-25 LAB — CBC
HEMATOCRIT: 35.5 % — AB (ref 39.0–52.0)
Hemoglobin: 11.9 g/dL — ABNORMAL LOW (ref 13.0–17.0)
MCH: 37.9 pg — AB (ref 26.0–34.0)
MCHC: 33.5 g/dL (ref 30.0–36.0)
MCV: 113.1 fL — AB (ref 78.0–100.0)
Platelets: 218 10*3/uL (ref 150–400)
RBC: 3.14 MIL/uL — ABNORMAL LOW (ref 4.22–5.81)
RDW: 13.4 % (ref 11.5–15.5)
WBC: 12 10*3/uL — ABNORMAL HIGH (ref 4.0–10.5)

## 2018-02-25 LAB — SODIUM
SODIUM: 156 mmol/L — AB (ref 135–145)
SODIUM: 154 mmol/L — AB (ref 135–145)
SODIUM: 153 mmol/L — AB (ref 135–145)

## 2018-02-25 LAB — PROCALCITONIN

## 2018-02-25 LAB — FACTOR 5 LEIDEN

## 2018-02-25 MED ORDER — SODIUM CHLORIDE 0.9 % IV SOLN
2.0000 g | Freq: Two times a day (BID) | INTRAVENOUS | Status: DC
  Administered 2018-02-25 – 2018-02-28 (×8): 2 g via INTRAVENOUS
  Filled 2018-02-25 (×9): qty 20

## 2018-02-25 NOTE — Progress Notes (Signed)
STROKE TEAM PROGRESS NOTE   SUBJECTIVE (INTERVAL HISTORY) No family is at the bedside.  Pt neuro stable. Na 156. Still has aphasia, and right hemiplegia but no change from yesterday. ID put on rocephin bid. Will do TEE next Monday.   OBJECTIVE Temp:  [98.3 F (36.8 C)-98.9 F (37.2 C)] 98.7 F (37.1 C) (07/19 0400) Pulse Rate:  [52-98] 55 (07/19 0700) Cardiac Rhythm: Normal sinus rhythm;Sinus bradycardia (07/19 0400) Resp:  [13-21] 14 (07/19 0700) BP: (118-165)/(65-103) 140/103 (07/19 0700) SpO2:  [94 %-98 %] 94 % (07/19 0700)  CBC:  Recent Labs  Lab 02/20/18 0547  02/22/18 0516 02/24/18 0521 02/25/18 0518  WBC 19.5*   < > 18.3* 14.7* 12.0*  NEUTROABS 17.4*  --  15.2*  --   --   HGB 15.2   < > 14.5 12.6* 11.9*  HCT 43.0   < > 42.0 36.8* 35.5*  MCV 109.1*   < > 112.0* 113.9* 113.1*  PLT 174   < > 197 199 218   < > = values in this interval not displayed.    Basic Metabolic Panel:  Recent Labs  Lab 02/21/18 1702  02/22/18 0516  02/23/18 0542  02/24/18 0521  02/24/18 2314 02/25/18 0518  NA 157*   < > 157*   < > 160*   < > 158*   < > 156* 156*  K  --   --  3.2*  --  3.3*  --  3.5  --   --  3.5  CL  --   --  121*  --  126*  --  124*  --   --  120*  CO2  --   --  27  --  27  --  27  --   --  26  GLUCOSE  --   --  127*  --  113*  --  111*  --   --  107*  BUN  --   --  5*  --  12  --  18  --   --  16  CREATININE  --   --  0.65  --  0.80  --  0.69  --   --  0.70  CALCIUM  --   --  8.5*  --  8.6*  --  8.8*  --   --  8.5*  MG 1.8  --  2.0  --  2.1  --   --   --   --   --   PHOS 2.9  --   --   --  3.0  --   --   --   --   --    < > = values in this interval not displayed.    Lipid Panel:     Component Value Date/Time   CHOL 101 02/20/2018 0547   TRIG 94 02/22/2018 1745   HDL 34 (L) 02/20/2018 0547   CHOLHDL 3.0 02/20/2018 0547   VLDL 21 02/20/2018 0547   LDLCALC 46 02/20/2018 0547   HgbA1c:  Lab Results  Component Value Date   HGBA1C 4.9 02/20/2018   Urine  Drug Screen:     Component Value Date/Time   LABOPIA NONE DETECTED 02/19/2018 1655   COCAINSCRNUR NONE DETECTED 02/19/2018 1655   LABBENZ NONE DETECTED 02/19/2018 1655   AMPHETMU NONE DETECTED 02/19/2018 1655   THCU NONE DETECTED 02/19/2018 1655   LABBARB (A) 02/19/2018 1655    Result not available. Reagent lot number recalled by manufacturer.  Alcohol Level No results found for: [facility]  IMAGING I have personally reviewed the radiological images below and agree with the radiology interpretations.  Ct Angio Head W Or Wo Contrast Ct Angio Neck W Or Wo Contrast Ct Cerebral Perfusion W Contrast 02/19/2018 IMPRESSION:  Large territory left MCA acute infarct. 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 Occlusion left A1 with reconstitution left A2. Occlusion left M1 and M2 segments.   IR Angiogram Dr Earleen Newport 02/19/2018 3:08 PM Findings:  Left proximal cervical ICA dissection/thrombus Left M1 occlusion First pass, TICI 2a, with emboli in distal M1/M2 Second pass, TICI 2a, with persistent occlusion of M2 branch Third pass, TICI 2b Occlusive left ICA dissection, with patent ACA and maintained cross-flow from right to left Posterior circulation perfuses the left temporal and occipital region Flat panel CT shows SAH/ICH Complications: ICH  Ct Head Wo Contrast 02/19/2018 IMPRESSION:  Left MCA territory infarction with early low-density and swelling but no mass effect or shift at this time. I think most the hyperdensity seen previously relates to contrast staining, much of which is diminishing or resolved. There is some persistent contrast staining in the region of the insula and I could not exclude that there is a small amount of admixed hemorrhage.   Ct Head Wo Contrast 02/19/2018 IMPRESSION:  Large territory acute infarct left MCA territory. Interval development of  multiple areas of patchy hemorrhage within the infarct following clot retrieval. No midline shift.   Ct Head Code Stroke Wo Contrast 02/19/2018 IMPRESSION:  1. Hypodensities left frontal lobe and left occipital parietal lobe most compatible with acute infarct. Possible emboli or watershed infarct. Negative for hemorrhage or mass-effect  2. ASPECTS is 8   MRI / MRA Acute infarct of the entire left MCA territory with mild associated hemorrhage in the left parietal and frontal lobe. Diffuse edema. 2 mm midline shift to the right. No other infarct Occlusion of the left internal carotid artery and entire left middle cerebral artery  Transthoracic Echocardiogram - Left ventricle: The cavity size was normal. Wall thickness was   increased in a pattern of mild LVH. Systolic function was normal.   The estimated ejection fraction was in the range of 55% to 60%.   Wall motion was normal; there were no regional wall motion   abnormalities. Left ventricular diastolic function parameters   were normal. - Aortic valve: There was a small, mobile vegetation on the left   ventricular aspect. Impressions: - There is a small - medium sized mobile mass on the LV aspect of   the aortic valve.   The appearence is c/w vegetation.   Aortic valve endocarditis  VAS [place] LOWER EXTREMITY VENOUS (DVT) There is no DVT or SVT noted in the bilateral lower extremities.   CUS  R: no ICA stenosis. The left ICA appears occluded.  Right vertebral artery flow is antegrade.  Left vertebral artery not insonated secondary to patient's positioning on vent.  Ct Head Wo Contrast 02/21/2018 CLINICAL DATA:  Stroke follow-up EXAM: CT HEAD WITHOUT CONTRAST TECHNIQUE: Contiguous axial images were obtained from the base of the skull through the vertex without intravenous contrast. COMPARISON:  Head CT 02/19/2018 FINDINGS: Brain: There is increased hypodensity throughout the left MCA distribution in the location of the known infarct.  Hyperdense material within the infarcted region has also decreased in density, with no increased volume. Mass effect on the left lateral ventricle  is increased and there is now rightward midline shift measuring 4 mm at the level of the foramina of [place]. No hydrocephalus. Right hemisphere and cerebellum are normal. Vascular: Hyperattenuation of the left middle cerebral artery. Skull: The visualized skull base, calvarium and extracranial soft tissues are normal. Sinuses/Orbits: No fluid levels or advanced mucosal thickening of the visualized paranasal sinuses. No mastoid or middle ear effusion. The orbits are normal. IMPRESSION: 1. Progression of cytotoxic edema throughout the left MCA distribution, causing slight mass effect on the left lateral ventricle with 4 mm of rightward midline shift. No hydrocephalus. 2. Decreased density in unchanged distribution of hyperattenuating material in the left MCA territory, favored to indicate resolving contrast staining. Electronically Signed   By: Ulyses Jarred M.D.   On: 02/21/2018 05:15   Ct Head Wo Contrast  Result Date: 02/23/2018 CLINICAL DATA:  37 y/o  M; decompressive craniectomy. EXAM: CT HEAD WITHOUT CONTRAST TECHNIQUE: Contiguous axial images were obtained from the base of the skull through the vertex without intravenous contrast. COMPARISON:  02/21/2018 CT head.  02/20/2018 MRI of the head. FINDINGS: Brain: Severe progressed edema throughout the left MCA infarction. Further decrease in attenuation of hyperdense material in the center of the infarct. Mass effect from edema has increased with mild herniation of the hemisphere through the craniectomy, partial effacement of left lateral ventricle, and 5 mm of left-to-right midline shift. There is slight interval dilatation of the right lateral ventricle which may represent developing entrapment. There is a small volume of acute extra-axial hemorrhage posterior cranial margin compatible postoperative change. Vascular:  Density within left internal carotid artery and MCA compatible with thrombosis. Skull: Interval left lateral craniectomy with extensive edema throughout the left scalp, periorbital compartment, and face, several small foci of air, and a small air-fluid level compatible postoperative changes. Sinuses/Orbits: Mild mucosal thickening within the sphenoid sinus and the right maxillary sinus. Normal aeration of mastoid air cells. Orbital compartments are unremarkable. Other: None. IMPRESSION: 1. Interval postsurgical changes related to left hemi craniectomy. 2. Increased edema and mass effect associated with the left MCA infarct with mild herniation of the left hemisphere through the craniectomy, further effacement of left lateral ventricle, and 5 mm left-to-right midline shift. 3. Slight interval increased dilatation of right lateral ventricle may represent entrapment, attention at follow-up recommended. Electronically Signed   By: Kristine Garbe M.D.   On: 02/23/2018 06:05   Dg Chest Port 1 View  Result Date: 02/23/2018 CLINICAL DATA:  Respiratory failure EXAM: PORTABLE CHEST 1 VIEW COMPARISON:  02/21/2018 FINDINGS: Left central line remains in place, unchanged. Interval removal of NG tube and endotracheal tube. Heart is upper limits normal in size. Platelike atelectasis in the right lower lung. Left lung clear. No effusions. IMPRESSION: Continued right lower lung atelectasis. Electronically Signed   By: Rolm Baptise M.D.   On: 02/23/2018 10:46   Ct Head Wo Contrast  Result Date: 02/24/2018 CLINICAL DATA:  37 y/o  M; stroke for follow-up. EXAM: CT HEAD WITHOUT CONTRAST TECHNIQUE: Contiguous axial images were obtained from the base of the skull through the vertex without intravenous contrast. COMPARISON:  02/23/2018 CT head. FINDINGS: Brain: Stable left MCA distribution infarction, associated mass effect, mild herniation through the left craniectomy, partial effacement of left lateral ventricle, and 5  mm left-to-right midline shift. Stable slightly distended right lateral ventricle. Further decrease in density high attenuation material within the left MCA distribution infarction. Stable small volume of extra-axial hemorrhage at the site of the craniectomy. No new stroke or  hemorrhage identified. No downward herniation. Vascular: High attenuation left internal carotid artery and MCA compatible with thrombus. Skull: Postsurgical changes related to a left hemi craniectomy with stable edema and pneumatosis of the overlying scalp as well as skin staples. Sinuses/Orbits: No acute finding. Other: Mild right maxillary sinus and sphenoid sinus mucosal thickening. Normal aeration of mastoid air cells. Orbital compartments are unremarkable. IMPRESSION: 1. Stable left MCA distribution infarction, mass effect, herniation through the left hemi craniectomy, effacement of left lateral ventricle, and 5 mm left-to-right midline shift. Stable slight dilatation of right lateral ventricle. 2. No new acute intracranial abnormality identified. Electronically Signed   By: Kristine Garbe M.D.   On: 02/24/2018 05:17   CT pending  PHYSICAL EXAM Temp:  [98.3 F (36.8 C)-98.9 F (37.2 C)] 98.7 F (37.1 C) (07/19 0400) Pulse Rate:  [52-98] 55 (07/19 0700) Resp:  [13-21] 14 (07/19 0700) BP: (118-165)/(65-103) 140/103 (07/19 0700) SpO2:  [94 %-98 %] 94 % (07/19 0700)  General - Well nourished, well developed, lethargic.  Ophthalmologic - fundi not visualized due to noncooperation and right eyelid swollen.  Cardiovascular - Regular rate and rhythm.  Neuro - sitting in chair. Both eyes open with left eye slightly smaller due to soft tissue edema. PERRL. Left gaze preference, barely cross midline. Able to follow all central commands and some peripheral command on the left hand. Not blinking to visual threat on the right. Right facial droop. LUE and LLE spontaneous movement against gravity. RUE 1/5 with increased tone  and RLE 2/5 on pain stimulation. DTR 1+ and right babinski positive. Sensation, coordination and gait not tested.  Exam unchanged from yesterday.    ASSESSMENT/PLAN [name] is a 37 y.o. male with no significant past medical history other than tobacco use presenting with right-sided weakness and aphasia. He did not receive IV t-PA due to late presentation.  Attempted thrombectomy with subsequent left ICA dissection and possible ICH.  Stroke: large left MCA territory infarct due to left ICA and MCA occlusion, etiology unclear, could be due to ICA dissection or aortic valve vegetation shown on TTE   Resultant  Intubated, right hemiplegia, aphasia  CT head - Large territory acute infarct left MCA territory.  IR - occlusive left ICA but TICI2b left MCA  MRI head - large left MCA infarct with small ICH/SAH hemorrhagic conversion  MRA head - left ICA and MCA remain occluded  CTA H&N - Acute dissection left internal carotid artery with intraluminal thrombus  LE Venous Dopplers - no DVT  Carotid Doppler - left ICA occluded  2D Echo - EF 55-60% and aortic valve mass vs. vegetation  consider TEE early next week  Hypercoagulable work up negative except high homocysteine at 181.7. MRHFR analysis pending  LDL - 46  HgbA1c - 4.9  VTE prophylaxis - subq heparin  No antithrombotic prior to admission, now on ASA.   Ongoing aggressive stroke risk factor management  Therapy recommendations:  CIR  Disposition:  Pending  Cerebral edema  MRA large left MCA infarct with 10mm midline shift 02/20/18  Repeat CT showed mass effect with 92mm midline shift 02/21/18  CT repeat showed increased mass effect to 32mm midline shift 02/23/18  CT repeat stable mass effect with midline shift at 64mm 02/24/18  Off 3% saline  Na goal 150-160  Na Q6h  S/p [facility] - mental status much improved  CT head pending in am  Aortic valve mass vs. vegetation  TTE concerning for AV vegetation  vs. mass  First 2/2 blood culture negative  Repeat blood culture - 1/2 Co neg Staph - likely contamination  On rocephin Q12h for now  WBC 15.4->19.5->14.2->18.3->14.7->12  Tmax 100.6->97.2->100.9->afebrile  Cardiology consult appreciated  TEE next monday  ID recs appreciated  Severe hyperhomocysteinemia - associated with low FA, B12 and smoking  Homocysteine 181.7  Low FA level - 1.5 - on FA supplement  Low normal B12 level - 224 -supplementation ordered  Smoker - will provide education  On supplement of FA and B12 and B6  B6 level pending  MRHFR mutation pending  Left ICA dissection ??  Girlfriend stated that he bumped his head to window 3 weeks ago  Left ICA occlusion   Interventional Neuroradiology feels consistent with dissection  Tobacco abuse  Current smoker  Smoking cessation counseling will be provided   Other Stroke Risk Factors  ETOH use, advised to drink no more than 1 alcoholic beverage per day.  Family hx of stroke (grandparents)  Other [facility] day # 6  This patient is critically ill due to large left MCA infarct with hemorrhagic conversion, endocarditis, smoker and at significant risk of neurological worsening, death form recurrent stroke, hemorrhage, heart failure, seizure, brain herniation. This patient's care requires constant monitoring of vital signs, hemodynamics, respiratory and cardiac monitoring, review of multiple databases, neurological assessment, discussion with family, other specialists and medical decision making of high complexity. I spent 35 minutes of neurocritical care time in the care of this patient.   Rosalin Hawking, MD PhD Stroke Neurology 02/25/2018 11:22 AM   To contact Stroke Continuity provider, please refer to [email or web address] After hours, contact General Neurology

## 2018-02-25 NOTE — Anesthesia Postprocedure Evaluation (Signed)
Anesthesia Post Note  Patient: Xavier Villegas  Procedure(s) Performed: DECOMPRESSIVE CRANIECTOMY with bone flap to abdomen (Left )     Patient location during evaluation: SICU Anesthesia Type: General Level of consciousness: sedated Pain management: pain level controlled Vital Signs Assessment: post-procedure vital signs reviewed and stable Respiratory status: patient remains intubated per anesthesia plan Cardiovascular status: stable Postop Assessment: no apparent nausea or vomiting Anesthetic complications: no    Last Vitals:  Vitals:   02/25/18 1700 02/25/18 1800  BP: (!) 150/85 (!) 149/74  Pulse: 83 71  Resp: 15 17  Temp:    SpO2: 97% 96%    Last Pain:  Vitals:   02/25/18 1700  TempSrc:   PainSc: 0-No pain                 Obe Ahlers

## 2018-02-25 NOTE — Progress Notes (Signed)
Pt removed his central line dressing twice. RN has redressed IJ and has placed Mitten on pt's left hand twice. RN will continue to monitor. Sherril Cong, RN

## 2018-02-25 NOTE — Progress Notes (Signed)
PHARMACY NOTE:  ANTIMICROBIAL RENAL DOSAGE ADJUSTMENT  Current antimicrobial regimen includes a mismatch between antimicrobial dosage and estimated renal function.  As per policy approved by the Pharmacy & Therapeutics and Medical Executive Committees, the antimicrobial dosage will be adjusted accordingly.  Current antimicrobial dosage:  Ceftriaxone 2 gm every 12 hours  Indication: Culture negative endocarditis   Renal Function:  Estimated Creatinine Clearance: 141.5 mL/min (by C-G formula based on SCr of 0.7 mg/dL). []      On intermittent HD, scheduled: []      On CRRT    Antimicrobial dosage has been changed to:  Ceftriaxone 2 gm every 12 hours   Additional comments: Changed to Q 12 hours for CNS penetration due to stroke/possible septic emboli in CSF.    Thank you for allowing pharmacy to be a part of this patient's care.  Jimmy Footman, PharmD, BCPS Antimicrobial Stewardship Clinical Pharmacist Phone: [phone] 02/25/2018 11:13 AM

## 2018-02-25 NOTE — Progress Notes (Signed)
[facility] for Infectious Disease   Reason for visit: Follow up on valve vegetation  Interval History: 1/4 blood cultures sets positive for CoNS.  No fever, WBC 12.  No acute events otherwise.   Physical Exam: Constitutional:  Vitals:   02/25/18 0800 02/25/18 0900  BP: (!) 151/89 131/76  Pulse: (!) 58 (!) 54  Resp: (!) 21 15  Temp: 98.1 F (36.7 C)   SpO2: 95% 94%   patient appears in NAD Eyes: anicteric Respiratory: Normal respiratory effort; CTA B Cardiovascular: RRR GI: soft, nt, nd Neuro: expressive aphagia  Review of Systems: Unable to be assessed due to aphasia  Lab Results  Component Value Date   WBC 12.0 (H) 02/25/2018   HGB 11.9 (L) 02/25/2018   HCT 35.5 (L) 02/25/2018   MCV 113.1 (H) 02/25/2018   PLT 218 02/25/2018    Lab Results  Component Value Date   CREATININE 0.70 02/25/2018   BUN 16 02/25/2018   NA 156 (H) 02/25/2018   K 3.5 02/25/2018   CL 120 (H) 02/25/2018   CO2 26 02/25/2018    Lab Results  Component Value Date   ALT 33 02/19/2018   AST 28 02/19/2018   ALKPHOS 96 02/19/2018     Microbiology: Recent Results (from the past 240 hour(s))  MRSA PCR Screening     Status: Abnormal   Collection Time: 02/19/18  4:00 PM  Result Value Ref Range Status   MRSA by PCR POSITIVE (A) NEGATIVE Final    Comment:        The GeneXpert MRSA Assay (FDA approved for NASAL specimens only), is one component of a comprehensive MRSA colonization surveillance program. It is not intended to diagnose MRSA infection nor to guide or monitor treatment for MRSA infections. RESULT CALLED TO, READ BACK BY AND VERIFIED WITH: S.MAYES RN AT 1752 02/19/18 BY A.DAVIS Performed at [facility], [address], [place], [place] [address]   Culture, blood (Routine X 2) w Reflex to ID Panel     Status: None   Collection Time: 02/20/18  4:56 PM  Result Value Ref Range Status   Specimen Description BLOOD CENTRAL LINE  Final   Special Requests   Final   BOTTLES DRAWN AEROBIC AND ANAEROBIC Blood Culture adequate volume   Culture   Final    NO GROWTH 5 DAYS Performed at [facility], [address] [place], [place] [address]    Report Status 02/25/2018 FINAL  Final  Culture, blood (Routine X 2) w Reflex to ID Panel     Status: None   Collection Time: 02/20/18  5:20 PM  Result Value Ref Range Status   Specimen Description BLOOD CENTRAL LINE  Final   Special Requests   Final    BOTTLES DRAWN AEROBIC AND ANAEROBIC Blood Culture adequate volume   Culture   Final    NO GROWTH 5 DAYS Performed at [facility], [place] [address] [place], [name] [address]    Report Status 02/25/2018 FINAL  Final  Culture, blood (routine x 2)     Status: None (Preliminary result)   Collection Time: 02/23/18  6:10 PM  Result Value Ref Range Status   Specimen Description BLOOD LEFT HAND  Final   Special Requests   Final    BOTTLES DRAWN AEROBIC ONLY Blood Culture adequate volume   Culture   Final    NO GROWTH 2 DAYS Performed at [facility], [place] [address]  [place], [place] [address]    Report Status PENDING  Incomplete  Culture, blood (routine x 2)     Status: Abnormal (Preliminary result)   Collection Time: 02/23/18  6:18 PM  Result Value Ref Range Status   Specimen Description BLOOD RIGHT ANTECUBITAL  Final   Special Requests   Final    BOTTLES DRAWN AEROBIC AND ANAEROBIC Blood Culture adequate volume   Culture  Setup Time   Final    GRAM POSITIVE COCCI ANAEROBIC BOTTLE ONLY CRITICAL RESULT CALLED TO, READ BACK BY AND VERIFIED WITH: [place] 02/24/18 A BROWNING Performed at [facility], [place] [address] [place], [place] [address]    Culture STAPHYLOCOCCUS SPECIES (COAGULASE NEGATIVE) (A)  Final   Report Status PENDING  Incomplete  Blood Culture ID Panel (Reflexed)     Status: Abnormal   Collection Time: 02/23/18  6:18 PM  Result Value Ref Range Status   Enterococcus species NOT DETECTED NOT DETECTED Final    Listeria monocytogenes NOT DETECTED NOT DETECTED Final   Staphylococcus species DETECTED (A) NOT DETECTED Final    Comment: Methicillin (oxacillin) susceptible coagulase negative staphylococcus. Possible blood culture contaminant (unless isolated from more than one blood culture draw or clinical case suggests pathogenicity). No antibiotic treatment is indicated for blood  culture contaminants. CRITICAL RESULT CALLED TO, READ BACK BY AND VERIFIED WITH: Dion Body PHARMD 1945 02/24/18 A BROWNING    Staphylococcus aureus NOT DETECTED NOT DETECTED Final   Methicillin resistance NOT DETECTED NOT DETECTED Final   Streptococcus species NOT DETECTED NOT DETECTED Final   Streptococcus agalactiae NOT DETECTED NOT DETECTED Final   Streptococcus pneumoniae NOT DETECTED NOT DETECTED Final   Streptococcus pyogenes NOT DETECTED NOT DETECTED Final   Acinetobacter baumannii NOT DETECTED NOT DETECTED Final   Enterobacteriaceae species NOT DETECTED NOT DETECTED Final   Enterobacter cloacae complex NOT DETECTED NOT DETECTED Final   Escherichia coli NOT DETECTED NOT DETECTED Final   Klebsiella oxytoca NOT DETECTED NOT DETECTED Final   Klebsiella pneumoniae NOT DETECTED NOT DETECTED Final   Proteus species NOT DETECTED NOT DETECTED Final   Serratia marcescens NOT DETECTED NOT DETECTED Final   Haemophilus influenzae NOT DETECTED NOT DETECTED Final   Neisseria meningitidis NOT DETECTED NOT DETECTED Final   Pseudomonas aeruginosa NOT DETECTED NOT DETECTED Final   Candida albicans NOT DETECTED NOT DETECTED Final   Candida glabrata NOT DETECTED NOT DETECTED Final   Candida krusei NOT DETECTED NOT DETECTED Final   Candida parapsilosis NOT DETECTED NOT DETECTED Final   Candida tropicalis NOT DETECTED NOT DETECTED Final    Comment: Performed at [facility], [address] [place], [place] [address]    Impression/Plan:  1. AV vegetation - unclear if infected or not.  There is one positive blood culture and I  suspect this is a contaminate.  Discussed with Dr. Johnsie Cancel and no plan for TEE until after treatment for 6 weeks for presumptive endocarditis.  I started vancomycin and ceftriaxone and will continue.  If a second blood culture grows CoNS, then I will target that.    2. Left MCA acute infarct - acute dissection with thrombus and occlussion of left ICA and MCA occlussion.  S/p craniotomy.  HIgh homocysteine level  Dr. Johnnye Sima is available over the weekend if needed, otherwise I will follow up next week.

## 2018-02-25 NOTE — Progress Notes (Signed)
Subjective:  Aphasia sitting in chair   Objective:  Vitals:   02/25/18 0400 02/25/18 0500 02/25/18 0600 02/25/18 0700  BP: (!) 145/80 138/85 (!) 142/92 (!) 140/103  Pulse: (!) 56 (!) 52 61 (!) 55  Resp: 15 13 16 14   Temp: 98.7 F (37.1 C)     TempSrc: Oral     SpO2: 95% 96% 96% 94%  Weight:      Height:        Intake/Output from previous day:  Intake/Output Summary (Last 24 hours) at 02/25/2018 0813 Last data filed at 02/25/2018 0700 Gross per 24 hour  Intake 910.38 ml  Output 830 ml  Net 80.38 ml    Physical Exam: Intubated Young white male  Post craniotomy  Neck supple with no adenopathy JVP normal no bruits no thyromegaly Lungs clear with no wheezing and good diaphragmatic motion Heart:  S1/S2 no murmur, no rub, gallop or click PMI normal Abdomen: benighn, BS positve, no tenderness, no AAA no bruit.  No HSM or HJR Distal pulses intact with no bruits No edema Right hemiparesis  Skin warm and dry   Lab Results: Basic Metabolic Panel: Recent Labs    02/23/18 0542  02/24/18 0521  02/24/18 2314 02/25/18 0518  NA 160*   < > 158*   < > 156* 156*  K 3.3*  --  3.5  --   --  3.5  CL 126*  --  124*  --   --  120*  CO2 27  --  27  --   --  26  GLUCOSE 113*  --  111*  --   --  107*  BUN 12  --  18  --   --  16  CREATININE 0.80  --  0.69  --   --  0.70  CALCIUM 8.6*  --  8.8*  --   --  8.5*  MG 2.1  --   --   --   --   --   PHOS 3.0  --   --   --   --   --    < > = values in this interval not displayed.   Liver Function Tests: No results for input(s): AST, ALT, ALKPHOS, BILITOT, PROT, ALBUMIN in the last 72 hours. No results for input(s): LIPASE, AMYLASE in the last 72 hours. CBC: Recent Labs    02/24/18 0521 02/25/18 0518  WBC 14.7* 12.0*  HGB 12.6* 11.9*  HCT 36.8* 35.5*  MCV 113.9* 113.1*  PLT 199 218   Hemoglobin A1C: No results for input(s): HGBA1C in the last 72 hours. Fasting Lipid Panel: Recent Labs    02/22/18 1745  TRIG 94    Thyroid Function Tests: No results for input(s): TSH, T4TOTAL, T3FREE, THYROIDAB in the last 72 hours.  Invalid input(s): FREET3 Anemia Panel: No results for input(s): VITAMINB12, FOLATE, FERRITIN, TIBC, IRON, RETICCTPCT in the last 72 hours.  Imaging: Ct Head Wo Contrast  Result Date: 02/24/2018 CLINICAL DATA:  37 y/o  M; stroke for follow-up. EXAM: CT HEAD WITHOUT CONTRAST TECHNIQUE: Contiguous axial images were obtained from the base of the skull through the vertex without intravenous contrast. COMPARISON:  02/23/2018 CT head. FINDINGS: Brain: Stable left MCA distribution infarction, associated mass effect, mild herniation through the left craniectomy, partial effacement of left lateral ventricle, and 5 mm left-to-right midline shift. Stable slightly distended right lateral ventricle. Further decrease in density high attenuation material within the left MCA distribution infarction. Stable small volume  of extra-axial hemorrhage at the site of the craniectomy. No new stroke or hemorrhage identified. No downward herniation. Vascular: High attenuation left internal carotid artery and MCA compatible with thrombus. Skull: Postsurgical changes related to a left hemi craniectomy with stable edema and pneumatosis of the overlying scalp as well as skin staples. Sinuses/Orbits: No acute finding. Other: Mild right maxillary sinus and sphenoid sinus mucosal thickening. Normal aeration of mastoid air cells. Orbital compartments are unremarkable. IMPRESSION: 1. Stable left MCA distribution infarction, mass effect, herniation through the left hemi craniectomy, effacement of left lateral ventricle, and 5 mm left-to-right midline shift. Stable slight dilatation of right lateral ventricle. 2. No new acute intracranial abnormality identified. Electronically Signed   By: Kristine Garbe M.D.   On: 02/24/2018 05:17    Cardiac Studies:  ECG:   NSR no afib    Echo:  AV vegetation no AR/AS EF  65%  Medications:   . aspirin  325 mg Oral Daily   Or  . aspirin  300 mg Rectal Daily  . chlorhexidine  15 mL Mouth Rinse BID  . feeding supplement (ENSURE ENLIVE)  237 mL Oral BID BM  . folic acid  4 mg Oral Daily  . mouth rinse  15 mL Mouth Rinse q12n4p  . pantoprazole  40 mg Oral Daily  . vitamin B-6  50 mg Oral Daily  . vitamin B-12  1,000 mcg Oral Daily     . sodium chloride 10 mL/hr at 02/25/18 0700  . cefTRIAXone (ROCEPHIN)  IV Stopped (02/24/18 1909)  . clevidipine Stopped (02/23/18 0830)  . sodium chloride (hypertonic) Stopped (02/23/18 0236)  . vancomycin Stopped (02/25/18 0017)    Assessment/Plan:   CVA:  Etiology not clear with left ICA dissection and apparent vegetation on AV Did have soft tissue infection on lip Rx antibiotics weeks before CVA/ Discussed with ID yesterday Rx culture negative SBE Now on Ceftriaxone and Vancomycin Would not pursue TEE until clinically more stable post craniotomy to relieve IC pressure. Can consider f/u TEE after Rx 6 weeks of antibiotics for presumed SBE or prior to d/c to rehab. Valve has no hemodynamic issues no AR/AS no murmur on exam   Xavier Villegas 02/25/2018, 8:13 AM

## 2018-02-26 ENCOUNTER — Inpatient Hospital Stay (HOSPITAL_COMMUNITY): Payer: BLUE CROSS/BLUE SHIELD

## 2018-02-26 LAB — SODIUM: SODIUM: 151 mmol/L — AB (ref 135–145)

## 2018-02-26 LAB — GLUCOSE, CAPILLARY
GLUCOSE-CAPILLARY: 99 mg/dL (ref 70–99)
Glucose-Capillary: 123 mg/dL — ABNORMAL HIGH (ref 70–99)
Glucose-Capillary: 92 mg/dL (ref 70–99)

## 2018-02-26 LAB — CBC
HCT: 36 % — ABNORMAL LOW (ref 39.0–52.0)
HEMOGLOBIN: 12.3 g/dL — AB (ref 13.0–17.0)
MCH: 38.2 pg — ABNORMAL HIGH (ref 26.0–34.0)
MCHC: 34.2 g/dL (ref 30.0–36.0)
MCV: 111.8 fL — ABNORMAL HIGH (ref 78.0–100.0)
Platelets: 241 10*3/uL (ref 150–400)
RBC: 3.22 MIL/uL — AB (ref 4.22–5.81)
RDW: 13 % (ref 11.5–15.5)
WBC: 12.9 10*3/uL — AB (ref 4.0–10.5)

## 2018-02-26 LAB — CULTURE, BLOOD (ROUTINE X 2): Special Requests: ADEQUATE

## 2018-02-26 LAB — BASIC METABOLIC PANEL
ANION GAP: 7 (ref 5–15)
BUN: 16 mg/dL (ref 6–20)
CHLORIDE: 115 mmol/L — AB (ref 98–111)
CO2: 26 mmol/L (ref 22–32)
Calcium: 8.4 mg/dL — ABNORMAL LOW (ref 8.9–10.3)
Creatinine, Ser: 0.56 mg/dL — ABNORMAL LOW (ref 0.61–1.24)
GFR calc non Af Amer: >60 mL/min (ref >60)
GLUCOSE: 101 mg/dL — AB (ref 70–99)
POTASSIUM: 3.6 mmol/L (ref 3.5–5.1)
SODIUM: 148 mmol/L — AB (ref 135–145)

## 2018-02-26 LAB — VANCOMYCIN, TROUGH: VANCOMYCIN TR: 15 ug/mL (ref 15–20)

## 2018-02-26 LAB — VITAMIN B6: Vitamin B6: 3 ug/L — ABNORMAL LOW (ref 5.3–46.7)

## 2018-02-26 NOTE — Progress Notes (Signed)
Pharmacy Antibiotic Note  Xavier Villegas is a 37 y.o. male admitted on 02/19/2018 with aortic valve endocarditis.  Pharmacy has been consulted for Vancomycin dosing. Also on Ceftriaxone. TTE with vegetation on aortic valve.   WBC trending down, renal function stable, vancomycin trough is therapeutic at 15 tonight  Plan: Cont vancomycin 1000 mg IV q8h Repeat vancomycin trough as needed  Height: 6' (182.9 cm) Weight: 182 lb 8.7 oz (82.8 kg) IBW/kg (Calculated) : 77.6  Temp (24hrs), Avg:98.8 F (37.1 C), Min:98 F (36.7 C), Max:99.5 F (37.5 C)  Recent Labs  Lab 02/20/18 0547 02/21/18 0539 02/22/18 0516 02/23/18 0542 02/24/18 0521 02/25/18 0518 02/25/18 2350  WBC 19.5* 14.2* 18.3*  --  14.7* 12.0*  --   CREATININE 0.71 0.70 0.65 0.80 0.69 0.70  --   VANCOTROUGH  --   --   --   --   --   --  15    Estimated Creatinine Clearance: 141.5 mL/min (by C-G formula based on SCr of 0.7 mg/dL).    No Known Allergies  Antimicrobials this admission: Vancomycin 7/18 >> Ceftriaxone 7/18 >>  Narda Bonds, PharmD, BCPS Clinical Pharmacist Phone: [phone]

## 2018-02-26 NOTE — Progress Notes (Signed)
Subjective:  Significant expressive and receptive aphasia.  Shakes her head yes to most all questions.  Objective:  Vital Signs in the last 24 hours: BP 131/80 (BP Location: Right Arm)   Pulse 64   Temp 99.5 F (37.5 C) (Oral)   Resp 16   Ht 6' (1.829 m)   Wt 82.8 kg (182 lb 8.7 oz)   SpO2 96%   BMI 24.76 kg/m   Physical Exam: Pleasant male lying in bed craniotomy scar noted. Lungs:  Clear Cardiac:  Regular rhythm, normal S1 and S2, no S3, no murmur Extremities:  No edema present  Intake/Output from previous day: 07/19 0701 - 07/20 0700 In: 1052.2 [I.V.:149.4; IV Piggyback:902.8] Out: 1225 [Urine:1225]  Weight Filed Weights   02/21/18 0500 02/22/18 0412 02/23/18 0500  Weight: 86 kg (189 lb 9.5 oz) 83.9 kg (184 lb 15.5 oz) 82.8 kg (182 lb 8.7 oz)    Lab Results: Basic Metabolic Panel: Recent Labs    02/25/18 0518  02/25/18 2350 02/26/18 0526  NA 156*   < > 151* 148*  K 3.5  --   --  3.6  CL 120*  --   --  115*  CO2 26  --   --  26  GLUCOSE 107*  --   --  101*  BUN 16  --   --  16  CREATININE 0.70  --   --  0.56*   < > = values in this interval not displayed.   CBC: Recent Labs    02/25/18 0518 02/26/18 0526  WBC 12.0* 12.9*  HGB 11.9* 12.3*  HCT 35.5* 36.0*  MCV 113.1* 111.8*  PLT 218 241   Telemetry: Sinus rhythm personally reviewed  Assessment/Plan:  1.  Aortic valve endocarditis not good candidate for TEE at present time  Recommendations:  Plan is to consider TEE down the road.  No overt cardiac issues.  Continue treatment for endocarditis.  Will follow at distance.     Kerry Hough  MD [facility] Cardiology  02/26/2018, 11:18 AM

## 2018-02-26 NOTE — Progress Notes (Signed)
STROKE TEAM PROGRESS NOTE   SUBJECTIVE (INTERVAL HISTORY) No family is at the bedside.  Pt neuro stable. Na 148. Still has aphasia, and right hemiplegia but no change from yesterday. ID put on rocephin bid.  CT scan of the head today shows stable appearance of the large left MCA infarct with 5 mm shift unchanged from last scan OBJECTIVE Temp:  [98.3 F (36.8 C)-99.5 F (37.5 C)] 98.3 F (36.8 C) (07/20 1200) Pulse Rate:  [57-83] 65 (07/20 1200) Cardiac Rhythm: Normal sinus rhythm (07/20 1200) Resp:  [13-19] 15 (07/20 1200) BP: (121-163)/(70-104) 163/92 (07/20 1200) SpO2:  [94 %-98 %] 96 % (07/20 1200)  CBC:  Recent Labs  Lab 02/20/18 0547  02/22/18 0516  02/25/18 0518 02/26/18 0526  WBC 19.5*   < > 18.3*   < > 12.0* 12.9*  NEUTROABS 17.4*  --  15.2*  --   --   --   HGB 15.2   < > 14.5   < > 11.9* 12.3*  HCT 43.0   < > 42.0   < > 35.5* 36.0*  MCV 109.1*   < > 112.0*   < > 113.1* 111.8*  PLT 174   < > 197   < > 218 241   < > = values in this interval not displayed.    Basic Metabolic Panel:  Recent Labs  Lab 02/21/18 1702  02/22/18 0516  02/23/18 0542  02/25/18 0518  02/25/18 2350 02/26/18 0526  NA 157*   < > 157*   < > 160*   < > 156*   < > 151* 148*  K  --   --  3.2*  --  3.3*   < > 3.5  --   --  3.6  CL  --   --  121*  --  126*   < > 120*  --   --  115*  CO2  --   --  27  --  27   < > 26  --   --  26  GLUCOSE  --   --  127*  --  113*   < > 107*  --   --  101*  BUN  --   --  5*  --  12   < > 16  --   --  16  CREATININE  --   --  0.65  --  0.80   < > 0.70  --   --  0.56*  CALCIUM  --   --  8.5*  --  8.6*   < > 8.5*  --   --  8.4*  MG 1.8  --  2.0  --  2.1  --   --   --   --   --   PHOS 2.9  --   --   --  3.0  --   --   --   --   --    < > = values in this interval not displayed.    Lipid Panel:     Component Value Date/Time   CHOL 101 02/20/2018 0547   TRIG 94 02/22/2018 1745   HDL 34 (L) 02/20/2018 0547   CHOLHDL 3.0 02/20/2018 0547   VLDL 21 02/20/2018  0547   LDLCALC 46 02/20/2018 0547   HgbA1c:  Lab Results  Component Value Date   HGBA1C 4.9 02/20/2018   Urine Drug Screen:     Component Value Date/Time   LABOPIA NONE DETECTED 02/19/2018 1655  COCAINSCRNUR NONE DETECTED 02/19/2018 1655   LABBENZ NONE DETECTED 02/19/2018 1655   AMPHETMU NONE DETECTED 02/19/2018 1655   THCU NONE DETECTED 02/19/2018 1655   LABBARB (A) 02/19/2018 1655    Result not available. Reagent lot number recalled by manufacturer.    Alcohol Level No results found for: [facility]  IMAGING I have personally reviewed the radiological images below and agree with the radiology interpretations.  Ct Angio Head W Or Wo Contrast Ct Angio Neck W Or Wo Contrast Ct Cerebral Perfusion W Contrast 02/19/2018 IMPRESSION:  Large territory left MCA acute infarct. 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 Occlusion left A1 with reconstitution left A2. Occlusion left M1 and M2 segments.   IR Angiogram Dr Earleen Newport 02/19/2018 3:08 PM Findings:  Left proximal cervical ICA dissection/thrombus Left M1 occlusion First pass, TICI 2a, with emboli in distal M1/M2 Second pass, TICI 2a, with persistent occlusion of M2 branch Third pass, TICI 2b Occlusive left ICA dissection, with patent ACA and maintained cross-flow from right to left Posterior circulation perfuses the left temporal and occipital region Flat panel CT shows SAH/ICH Complications: ICH  Ct Head Wo Contrast 02/19/2018 IMPRESSION:  Left MCA territory infarction with early low-density and swelling but no mass effect or shift at this time. I think most the hyperdensity seen previously relates to contrast staining, much of which is diminishing or resolved. There is some persistent contrast staining in the region of the insula and I could not exclude that there is a small amount of admixed hemorrhage.   Ct  Head Wo Contrast 02/19/2018 IMPRESSION:  Large territory acute infarct left MCA territory. Interval development of multiple areas of patchy hemorrhage within the infarct following clot retrieval. No midline shift.   Ct Head Code Stroke Wo Contrast 02/19/2018 IMPRESSION:  1. Hypodensities left frontal lobe and left occipital parietal lobe most compatible with acute infarct. Possible emboli or watershed infarct. Negative for hemorrhage or mass-effect  2. ASPECTS is 8   CT Head 02/26/2018 : 1. Stable left MCA distribution infarction, mass effect, herniation through the left hemi craniectomy, effacement of left lateral ventricle, and 5 mm left-to-right midline shift. Stable slight dilatation of right lateral ventricle. 2. No new acute intracranial abnormality identified.  MRI / MRA Acute infarct of the entire left MCA territory with mild associated hemorrhage in the left parietal and frontal lobe. Diffuse edema. 2 mm midline shift to the right. No other infarct Occlusion of the left internal carotid artery and entire left middle cerebral artery  Transthoracic Echocardiogram - Left ventricle: The cavity size was normal. Wall thickness was   increased in a pattern of mild LVH. Systolic function was normal.   The estimated ejection fraction was in the range of 55% to 60%.   Wall motion was normal; there were no regional wall motion   abnormalities. Left ventricular diastolic function parameters   were normal. - Aortic valve: There was a small, mobile vegetation on the left   ventricular aspect. Impressions: - There is a small - medium sized mobile mass on the LV aspect of   the aortic valve.   The appearence is c/w vegetation.   Aortic valve endocarditis  VAS [place] LOWER EXTREMITY VENOUS (DVT) There is no DVT or SVT noted in the bilateral lower extremities.   CUS  R: no ICA stenosis. The left ICA appears occluded.  Right vertebral artery flow is antegrade.  Left vertebral artery not  insonated secondary to patient's positioning on vent.  Ct Head Wo Contrast 02/21/2018 CLINICAL DATA:  Stroke follow-up EXAM: CT HEAD WITHOUT CONTRAST TECHNIQUE: Contiguous axial images were obtained from the base of the skull through the vertex without intravenous contrast. COMPARISON:  Head CT 02/19/2018 FINDINGS: Brain: There is increased hypodensity throughout the left MCA distribution in the location of the known infarct. Hyperdense material within the infarcted region has also decreased in density, with no increased volume. Mass effect on the left lateral ventricle is increased and there is now rightward midline shift measuring 4 mm at the level of the foramina of [place]. No hydrocephalus. Right hemisphere and cerebellum are normal. Vascular: Hyperattenuation of the left middle cerebral artery. Skull: The visualized skull base, calvarium and extracranial soft tissues are normal. Sinuses/Orbits: No fluid levels or advanced mucosal thickening of the visualized paranasal sinuses. No mastoid or middle ear effusion. The orbits are normal. IMPRESSION: 1. Progression of cytotoxic edema throughout the left MCA distribution, causing slight mass effect on the left lateral ventricle with 4 mm of rightward midline shift. No hydrocephalus. 2. Decreased density in unchanged distribution of hyperattenuating material in the left MCA territory, favored to indicate resolving contrast staining. Electronically Signed   By: Ulyses Jarred M.D.   On: 02/21/2018 05:15   Ct Head Wo Contrast  Result Date: 02/23/2018 CLINICAL DATA:  37 y/o  M; decompressive craniectomy. EXAM: CT HEAD WITHOUT CONTRAST TECHNIQUE: Contiguous axial images were obtained from the base of the skull through the vertex without intravenous contrast. COMPARISON:  02/21/2018 CT head.  02/20/2018 MRI of the head. FINDINGS: Brain: Severe progressed edema throughout the left MCA infarction. Further decrease in attenuation of hyperdense material in the center of  the infarct. Mass effect from edema has increased with mild herniation of the hemisphere through the craniectomy, partial effacement of left lateral ventricle, and 5 mm of left-to-right midline shift. There is slight interval dilatation of the right lateral ventricle which may represent developing entrapment. There is a small volume of acute extra-axial hemorrhage posterior cranial margin compatible postoperative change. Vascular: Density within left internal carotid artery and MCA compatible with thrombosis. Skull: Interval left lateral craniectomy with extensive edema throughout the left scalp, periorbital compartment, and face, several small foci of air, and a small air-fluid level compatible postoperative changes. Sinuses/Orbits: Mild mucosal thickening within the sphenoid sinus and the right maxillary sinus. Normal aeration of mastoid air cells. Orbital compartments are unremarkable. Other: None. IMPRESSION: 1. Interval postsurgical changes related to left hemi craniectomy. 2. Increased edema and mass effect associated with the left MCA infarct with mild herniation of the left hemisphere through the craniectomy, further effacement of left lateral ventricle, and 5 mm left-to-right midline shift. 3. Slight interval increased dilatation of right lateral ventricle may represent entrapment, attention at follow-up recommended. Electronically Signed   By: Kristine Garbe M.D.   On: 02/23/2018 06:05   Dg Chest Port 1 View  Result Date: 02/23/2018 CLINICAL DATA:  Respiratory failure EXAM: PORTABLE CHEST 1 VIEW COMPARISON:  02/21/2018 FINDINGS: Left central line remains in place, unchanged. Interval removal of NG tube and endotracheal tube. Heart is upper limits normal in size. Platelike atelectasis in the right lower lung. Left lung clear. No effusions. IMPRESSION: Continued right lower lung atelectasis. Electronically Signed   By: Rolm Baptise M.D.   On: 02/23/2018 10:46   Ct Head Wo Contrast  Result  Date: 02/24/2018 CLINICAL DATA:  37 y/o  M; stroke for follow-up.  EXAM: CT HEAD WITHOUT CONTRAST TECHNIQUE: Contiguous axial images were obtained from the base of the skull through the vertex without intravenous contrast. COMPARISON:  02/23/2018 CT head. FINDINGS: Brain: Stable left MCA distribution infarction, associated mass effect, mild herniation through the left craniectomy, partial effacement of left lateral ventricle, and 5 mm left-to-right midline shift. Stable slightly distended right lateral ventricle. Further decrease in density high attenuation material within the left MCA distribution infarction. Stable small volume of extra-axial hemorrhage at the site of the craniectomy. No new stroke or hemorrhage identified. No downward herniation. Vascular: High attenuation left internal carotid artery and MCA compatible with thrombus. Skull: Postsurgical changes related to a left hemi craniectomy with stable edema and pneumatosis of the overlying scalp as well as skin staples. Sinuses/Orbits: No acute finding. Other: Mild right maxillary sinus and sphenoid sinus mucosal thickening. Normal aeration of mastoid air cells. Orbital compartments are unremarkable. IMPRESSION: 1. Stable left MCA distribution infarction, mass effect, herniation through the left hemi craniectomy, effacement of left lateral ventricle, and 5 mm left-to-right midline shift. Stable slight dilatation of right lateral ventricle. 2. No new acute intracranial abnormality identified. Electronically Signed   By: Kristine Garbe M.D.   On: 02/24/2018 05:17   CT pending  PHYSICAL EXAM Temp:  [98.3 F (36.8 C)-99.5 F (37.5 C)] 98.3 F (36.8 C) (07/20 1200) Pulse Rate:  [57-83] 65 (07/20 1200) Resp:  [13-19] 15 (07/20 1200) BP: (121-163)/(70-104) 163/92 (07/20 1200) SpO2:  [94 %-98 %] 96 % (07/20 1200)  General - Well nourished, well developed, lethargic.  Ophthalmologic - fundi not visualized due to noncooperation and right  eyelid swollen.  Cardiovascular - Regular rate and rhythm.  Neuro - sitting up in bedr. Both eyes open with left eye slightly smaller due to soft tissue edema. PERRL. Left gaze preference, barely cross midline.severe expressive aphasia and does not speak any words. Able to follow all central commands and some peripheral command on the left hand. Not blinking to visual threat on the right. Right facial droop. LUE and LLE spontaneous movement against gravity. RUE 1/5 with increased tone and RLE 2/5 on pain stimulation. DTR 1+ and right babinski positive. Sensation, coordination and gait not tested.  Exam unchanged from yesterday.    ASSESSMENT/PLAN Mr. Nevyn Bossman is a 37 y.o. male with no significant past medical history other than tobacco use presenting with right-sided weakness and aphasia. He did not receive IV t-PA due to late presentation.  Attempted thrombectomy with subsequent left ICA dissection and possible ICH.  Stroke: large left MCA territory infarct due to left ICA and MCA occlusion, etiology unclear, could be due to ICA dissection or aortic valve vegetation shown on TTE   Resultant  Intubated, right hemiplegia, aphasia  CT head - Large territory acute infarct left MCA territory.  IR - occlusive left ICA but TICI2b left MCA  MRI head - large left MCA infarct with small ICH/SAH hemorrhagic conversion  MRA head - left ICA and MCA remain occluded  CTA H&N - Acute dissection left internal carotid artery with intraluminal thrombus  LE Venous Dopplers - no DVT  Carotid Doppler - left ICA occluded  2D Echo - EF 55-60% and aortic valve mass vs. vegetation  consider TEE early next week  Hypercoagulable work up negative except high homocysteine at 181.7. MRHFR analysis pending  LDL - 46  HgbA1c - 4.9  VTE prophylaxis - subq heparin  No antithrombotic prior to admission, now on ASA.   Ongoing aggressive stroke risk  factor management  Therapy recommendations:   CIR  Disposition:  Pending  Cerebral edema  MRA large left MCA infarct with 61mm midline shift 02/20/18  Repeat CT showed mass effect with 64mm midline shift 02/21/18  CT repeat showed increased mass effect to 36mm midline shift 02/23/18  CT repeat stable mass effect with midline shift at 59mm 02/24/18  Off 3% saline  Na goal 150-160  Na Q6h  S/p [facility] - mental status much improved  CT head pending in am  Aortic valve mass vs. vegetation  TTE concerning for AV vegetation vs. mass  First 2/2 blood culture negative  Repeat blood culture - 1/2 Co neg Staph - likely contamination  On rocephin Q12h for now  WBC 15.4->19.5->14.2->18.3->14.7->12  Tmax 100.6->97.2->100.9->afebrile  Cardiology consult appreciated  TEE next monday  ID recs appreciated  Severe hyperhomocysteinemia - associated with low FA, B12 and smoking  Homocysteine 181.7  Low FA level - 1.5 - on FA supplement  Low normal B12 level - 224 -supplementation ordered  Smoker - will provide education  On supplement of FA and B12 and B6  B6 level pending  MRHFR mutation pending  Left ICA dissection ??  Girlfriend stated that he bumped his head to window 3 weeks ago  Left ICA occlusion   Interventional Neuroradiology feels consistent with dissection  Tobacco abuse  Current smoker  Smoking cessation counseling will be provided   Other Stroke Risk Factors  ETOH use, advised to drink no more than 1 alcoholic beverage per day.  Family hx of stroke (grandparents)  Other [facility] day # 7 Transfer to neurology floor bed. Mobilize out of bed. Therapy and rehabilitation consults. Continue treatment for endocarditis with antibiotics  for 6 weeks as per ID and cardiology. May need elective transesophageal echocardiogram in the future when stable This patient is critically ill due to large left MCA infarct with hemorrhagic conversion, endocarditis, smoker and at significant risk of  neurological worsening, death form recurrent stroke, hemorrhage, heart failure, seizure, brain herniation. This patient's care requires constant monitoring of vital signs, hemodynamics, respiratory and cardiac monitoring, review of multiple databases, neurological assessment, discussion with family, other specialists and medical decision making of high complexity. I spent 35 minutes of neurocritical care time in the care of this patient.   Antony Contras, MD Stroke Neurology 02/26/2018 12:56 PM   To contact Stroke Continuity provider, please refer to [email or web address] After hours, contact General Neurology

## 2018-02-27 DIAGNOSIS — I6300 Cerebral infarction due to thrombosis of unspecified precerebral artery: Secondary | ICD-10-CM

## 2018-02-27 LAB — CBC
HCT: 36 % — ABNORMAL LOW (ref 39.0–52.0)
HEMOGLOBIN: 12.6 g/dL — AB (ref 13.0–17.0)
MCH: 38.3 pg — AB (ref 26.0–34.0)
MCHC: 35 g/dL (ref 30.0–36.0)
MCV: 109.4 fL — ABNORMAL HIGH (ref 78.0–100.0)
PLATELETS: 243 10*3/uL (ref 150–400)
RBC: 3.29 MIL/uL — ABNORMAL LOW (ref 4.22–5.81)
RDW: 12.3 % (ref 11.5–15.5)
WBC: 14.7 10*3/uL — ABNORMAL HIGH (ref 4.0–10.5)

## 2018-02-27 LAB — BASIC METABOLIC PANEL
Anion gap: 7 (ref 5–15)
BUN: 11 mg/dL (ref 6–20)
CHLORIDE: 107 mmol/L (ref 98–111)
CO2: 28 mmol/L (ref 22–32)
CREATININE: 0.47 mg/dL — AB (ref 0.61–1.24)
Calcium: 8.6 mg/dL — ABNORMAL LOW (ref 8.9–10.3)
GLUCOSE: 99 mg/dL (ref 70–99)
Potassium: 3.8 mmol/L (ref 3.5–5.1)
SODIUM: 142 mmol/L (ref 135–145)

## 2018-02-27 LAB — GLUCOSE, CAPILLARY: GLUCOSE-CAPILLARY: 74 mg/dL (ref 70–99)

## 2018-02-27 NOTE — Progress Notes (Signed)
STROKE TEAM PROGRESS NOTE   SUBJECTIVE (INTERVAL HISTORY) No family is at the bedside.  Pt neuro stable. Na 142 amd WBC 14.2. Still has aphasia, and right hemiplegia but no change from yesterday.  She was transferred to the floor bed yesterday. OBJECTIVE Temp:  [98 F (36.7 C)-98.8 F (37.1 C)] 98 F (36.7 C) (07/21 0846) Pulse Rate:  [56-65] 56 (07/21 0846) Cardiac Rhythm: Normal sinus rhythm (07/21 1157) Resp:  [16-20] 16 (07/21 0846) BP: (128-152)/(72-98) 128/85 (07/21 0846) SpO2:  [96 %-99 %] 98 % (07/21 0846)  CBC:  Recent Labs  Lab 02/22/18 0516  02/26/18 0526 02/27/18 0356  WBC 18.3*   < > 12.9* 14.7*  NEUTROABS 15.2*  --   --   --   HGB 14.5   < > 12.3* 12.6*  HCT 42.0   < > 36.0* 36.0*  MCV 112.0*   < > 111.8* 109.4*  PLT 197   < > 241 243   < > = values in this interval not displayed.    Basic Metabolic Panel:  Recent Labs  Lab 02/21/18 1702  02/22/18 0516  02/23/18 0542  02/26/18 0526 02/27/18 0356  NA 157*   < > 157*   < > 160*   < > 148* 142  K  --   --  3.2*  --  3.3*   < > 3.6 3.8  CL  --   --  121*  --  126*   < > 115* 107  CO2  --   --  27  --  27   < > 26 28  GLUCOSE  --   --  127*  --  113*   < > 101* 99  BUN  --   --  5*  --  12   < > 16 11  CREATININE  --   --  0.65  --  0.80   < > 0.56* 0.47*  CALCIUM  --   --  8.5*  --  8.6*   < > 8.4* 8.6*  MG 1.8  --  2.0  --  2.1  --   --   --   PHOS 2.9  --   --   --  3.0  --   --   --    < > = values in this interval not displayed.    Lipid Panel:     Component Value Date/Time   CHOL 101 02/20/2018 0547   TRIG 94 02/22/2018 1745   HDL 34 (L) 02/20/2018 0547   CHOLHDL 3.0 02/20/2018 0547   VLDL 21 02/20/2018 0547   LDLCALC 46 02/20/2018 0547   HgbA1c:  Lab Results  Component Value Date   HGBA1C 4.9 02/20/2018   Urine Drug Screen:     Component Value Date/Time   LABOPIA NONE DETECTED 02/19/2018 1655   COCAINSCRNUR NONE DETECTED 02/19/2018 1655   LABBENZ NONE DETECTED 02/19/2018 1655    AMPHETMU NONE DETECTED 02/19/2018 1655   THCU NONE DETECTED 02/19/2018 1655   LABBARB (A) 02/19/2018 1655    Result not available. Reagent lot number recalled by manufacturer.    Alcohol Level No results found for: [facility]  IMAGING I have personally reviewed the radiological images below and agree with the radiology interpretations.  Ct Angio Head W Or Wo Contrast Ct Angio Neck W Or Wo Contrast Ct Cerebral Perfusion W Contrast 02/19/2018 IMPRESSION:  Large territory left MCA acute infarct. Acute dissection left internal carotid artery with intraluminal thrombus and  slow flow in the left internal carotid artery which occludes in the supraclinoid segment. Occlusion left A1 with reconstitution left A2. Occlusion left M1 and M2 segments.   IR Angiogram Dr Earleen Newport 02/19/2018 3:08 PM Findings:  Left proximal cervical ICA dissection/thrombus Left M1 occlusion First pass, TICI 2a, with emboli in distal M1/M2 Second pass, TICI 2a, with persistent occlusion of M2 branch Third pass, TICI 2b Occlusive left ICA dissection, with patent ACA and maintained cross-flow from right to left Posterior circulation perfuses the left temporal and occipital region Flat panel CT shows SAH/ICH Complications: ICH  Ct Head Wo Contrast 02/19/2018 IMPRESSION:  Left MCA territory infarction with early low-density and swelling but no mass effect or shift at this time. I think most the hyperdensity seen previously relates to contrast staining, much of which is diminishing or resolved. There is some persistent contrast staining in the region of the insula and I could not exclude that there is a small amount of admixed hemorrhage.   Ct Head Wo Contrast 02/19/2018 IMPRESSION:  Large territory acute infarct left MCA territory. Interval development of multiple areas of patchy hemorrhage within the infarct following clot retrieval. No midline shift.   Ct Head Code Stroke Wo Contrast 02/19/2018 IMPRESSION:  1. Hypodensities  left frontal lobe and left occipital parietal lobe most compatible with acute infarct. Possible emboli or watershed infarct. Negative for hemorrhage or mass-effect  2. ASPECTS is 8   CT Head 02/26/2018 : 1. Stable left MCA distribution infarction, mass effect, herniation through the left hemi craniectomy, effacement of left lateral ventricle, and 5 mm left-to-right midline shift. Stable slight dilatation of right lateral ventricle. 2. No new acute intracranial abnormality identified.  MRI / MRA Acute infarct of the entire left MCA territory with mild associated hemorrhage in the left parietal and frontal lobe. Diffuse edema. 2 mm midline shift to the right. No other infarct Occlusion of the left internal carotid artery and entire left middle cerebral artery  Transthoracic Echocardiogram - Left ventricle: The cavity size was normal. Wall thickness was   increased in a pattern of mild LVH. Systolic function was normal.   The estimated ejection fraction was in the range of 55% to 60%.   Wall motion was normal; there were no regional wall motion   abnormalities. Left ventricular diastolic function parameters   were normal. - Aortic valve: There was a small, mobile vegetation on the left   ventricular aspect. Impressions: - There is a small - medium sized mobile mass on the LV aspect of   the aortic valve.   The appearence is c/w vegetation.   Aortic valve endocarditis  VAS [place] LOWER EXTREMITY VENOUS (DVT) There is no DVT or SVT noted in the bilateral lower extremities.   CUS  R: no ICA stenosis. The left ICA appears occluded.  Right vertebral artery flow is antegrade.  Left vertebral artery not insonated secondary to patient's positioning on vent.  Ct Head Wo Contrast 02/21/2018 CLINICAL DATA:  Stroke follow-up EXAM: CT HEAD WITHOUT CONTRAST TECHNIQUE: Contiguous axial images were obtained from the base of the skull through the vertex without intravenous contrast. COMPARISON:  Head  CT 02/19/2018 FINDINGS: Brain: There is increased hypodensity throughout the left MCA distribution in the location of the known infarct. Hyperdense material within the infarcted region has also decreased in density, with no increased volume. Mass effect on the left lateral ventricle is increased and there is now rightward midline shift measuring 4 mm at the level of the foramina  of [place]. No hydrocephalus. Right hemisphere and cerebellum are normal. Vascular: Hyperattenuation of the left middle cerebral artery. Skull: The visualized skull base, calvarium and extracranial soft tissues are normal. Sinuses/Orbits: No fluid levels or advanced mucosal thickening of the visualized paranasal sinuses. No mastoid or middle ear effusion. The orbits are normal. IMPRESSION: 1. Progression of cytotoxic edema throughout the left MCA distribution, causing slight mass effect on the left lateral ventricle with 4 mm of rightward midline shift. No hydrocephalus. 2. Decreased density in unchanged distribution of hyperattenuating material in the left MCA territory, favored to indicate resolving contrast staining. Electronically Signed   By: Ulyses Jarred M.D.   On: 02/21/2018 05:15   Ct Head Wo Contrast  Result Date: 02/23/2018 CLINICAL DATA:  37 y/o  M; decompressive craniectomy. EXAM: CT HEAD WITHOUT CONTRAST TECHNIQUE: Contiguous axial images were obtained from the base of the skull through the vertex without intravenous contrast. COMPARISON:  02/21/2018 CT head.  02/20/2018 MRI of the head. FINDINGS: Brain: Severe progressed edema throughout the left MCA infarction. Further decrease in attenuation of hyperdense material in the center of the infarct. Mass effect from edema has increased with mild herniation of the hemisphere through the craniectomy, partial effacement of left lateral ventricle, and 5 mm of left-to-right midline shift. There is slight interval dilatation of the right lateral ventricle which may represent  developing entrapment. There is a small volume of acute extra-axial hemorrhage posterior cranial margin compatible postoperative change. Vascular: Density within left internal carotid artery and MCA compatible with thrombosis. Skull: Interval left lateral craniectomy with extensive edema throughout the left scalp, periorbital compartment, and face, several small foci of air, and a small air-fluid level compatible postoperative changes. Sinuses/Orbits: Mild mucosal thickening within the sphenoid sinus and the right maxillary sinus. Normal aeration of mastoid air cells. Orbital compartments are unremarkable. Other: None. IMPRESSION: 1. Interval postsurgical changes related to left hemi craniectomy. 2. Increased edema and mass effect associated with the left MCA infarct with mild herniation of the left hemisphere through the craniectomy, further effacement of left lateral ventricle, and 5 mm left-to-right midline shift. 3. Slight interval increased dilatation of right lateral ventricle may represent entrapment, attention at follow-up recommended. Electronically Signed   By: Kristine Garbe M.D.   On: 02/23/2018 06:05   Dg Chest Port 1 View  Result Date: 02/23/2018 CLINICAL DATA:  Respiratory failure EXAM: PORTABLE CHEST 1 VIEW COMPARISON:  02/21/2018 FINDINGS: Left central line remains in place, unchanged. Interval removal of NG tube and endotracheal tube. Heart is upper limits normal in size. Platelike atelectasis in the right lower lung. Left lung clear. No effusions. IMPRESSION: Continued right lower lung atelectasis. Electronically Signed   By: Rolm Baptise M.D.   On: 02/23/2018 10:46   Ct Head Wo Contrast  Result Date: 02/24/2018 CLINICAL DATA:  37 y/o  M; stroke for follow-up. EXAM: CT HEAD WITHOUT CONTRAST TECHNIQUE: Contiguous axial images were obtained from the base of the skull through the vertex without intravenous contrast. COMPARISON:  02/23/2018 CT head. FINDINGS: Brain: Stable left MCA  distribution infarction, associated mass effect, mild herniation through the left craniectomy, partial effacement of left lateral ventricle, and 5 mm left-to-right midline shift. Stable slightly distended right lateral ventricle. Further decrease in density high attenuation material within the left MCA distribution infarction. Stable small volume of extra-axial hemorrhage at the site of the craniectomy. No new stroke or hemorrhage identified. No downward herniation. Vascular: High attenuation left internal carotid artery and MCA compatible with thrombus. Skull:  Postsurgical changes related to a left hemi craniectomy with stable edema and pneumatosis of the overlying scalp as well as skin staples. Sinuses/Orbits: No acute finding. Other: Mild right maxillary sinus and sphenoid sinus mucosal thickening. Normal aeration of mastoid air cells. Orbital compartments are unremarkable. IMPRESSION: 1. Stable left MCA distribution infarction, mass effect, herniation through the left hemi craniectomy, effacement of left lateral ventricle, and 5 mm left-to-right midline shift. Stable slight dilatation of right lateral ventricle. 2. No new acute intracranial abnormality identified. Electronically Signed   By: Kristine Garbe M.D.   On: 02/24/2018 05:17   CT pending  PHYSICAL EXAM Temp:  [98 F (36.7 C)-98.8 F (37.1 C)] 98 F (36.7 C) (07/21 0846) Pulse Rate:  [56-65] 56 (07/21 0846) Resp:  [16-20] 16 (07/21 0846) BP: (128-152)/(72-98) 128/85 (07/21 0846) SpO2:  [96 %-99 %] 98 % (07/21 0846)  General - Well nourished, well developed, lethargic.  Ophthalmologic - fundi not visualized due to noncooperation and right eyelid swollen.  Cardiovascular - Regular rate and rhythm.  Neuro - sitting up in bed. Both eyes open with left eye slightly smaller due to soft tissue edema. PERRL. Left gaze preference, barely cross midline.severe expressive aphasia and does not speak any words. Able to follow all central  commands and some peripheral command on the left hand. Not blinking to visual threat on the right. Right facial droop. LUE and LLE spontaneous movement against gravity. RUE 1/5 with increased tone and RLE 2/5 on pain stimulation. DTR 1+ and right babinski positive. Sensation, coordination and gait not tested.      ASSESSMENT/PLAN [name] is a 37 y.o. male with no significant past medical history other than tobacco use presenting with right-sided weakness and aphasia. He did not receive IV t-PA due to late presentation.  Attempted thrombectomy with subsequent left ICA dissection and possible ICH.  Stroke: large left MCA territory infarct due to left ICA and MCA occlusion, etiology unclear, could be due to ICA dissection or aortic valve vegetation shown on TTE   Resultant  Intubated, right hemiplegia, aphasia  CT head - Large territory acute infarct left MCA territory.  IR - occlusive left ICA but TICI2b left MCA  MRI head - large left MCA infarct with small ICH/SAH hemorrhagic conversion  MRA head - left ICA and MCA remain occluded  CTA H&N - Acute dissection left internal carotid artery with intraluminal thrombus  LE Venous Dopplers - no DVT  Carotid Doppler - left ICA occluded  2D Echo - EF 55-60% and aortic valve mass vs. vegetation  consider TEE early next week  Hypercoagulable work up negative except high homocysteine at 181.7. MRHFR analysis pending  LDL - 46  HgbA1c - 4.9  VTE prophylaxis - subq heparin  No antithrombotic prior to admission, now on ASA.   Ongoing aggressive stroke risk factor management  Therapy recommendations:  CIR  Disposition:  Pending  Cerebral edema  MRA large left MCA infarct with 40mm midline shift 02/20/18  Repeat CT showed mass effect with 73mm midline shift 02/21/18  CT repeat showed increased mass effect to 73mm midline shift 02/23/18  CT repeat stable mass effect with midline shift at 58mm 02/24/18  Off 3%  saline  Na goal 150-160  Na Q6h  S/p [facility] - mental status much improved  CT head pending in am  Aortic valve mass vs. vegetation  TTE concerning for AV vegetation vs. mass  First 2/2 blood culture negative  Repeat blood culture - 1/2  Co neg Staph - likely contamination  On rocephin Q12h for now  WBC 15.4->19.5->14.2->18.3->14.7->12  Tmax 100.6->97.2->100.9->afebrile  Cardiology consult appreciated  TEE next monday  ID recs appreciated  Severe hyperhomocysteinemia - associated with low FA, B12 and smoking  Homocysteine 181.7  Low FA level - 1.5 - on FA supplement  Low normal B12 level - 224 -supplementation ordered  Smoker - will provide education  On supplement of FA and B12 and B6  B6 level pending  MRHFR mutation pending  Left ICA dissection ??  Girlfriend stated that he bumped his head to window 3 weeks ago  Left ICA occlusion   Interventional Neuroradiology feels consistent with dissection  Tobacco abuse  Current smoker  Smoking cessation counseling will be provided   Other Stroke Risk Factors  ETOH use, advised to drink no more than 1 alcoholic beverage per day.  Family hx of stroke (grandparents)  Other [facility] day # 8  . Mobilize out of bed. Therapy and rehabilitation consults. Continue treatment for endocarditis with antibiotics  for 6 weeks as per ID and cardiology. May need elective transesophageal echocardiogram next week   Antony Contras, MD Stroke Neurology 02/27/2018 12:47 PM   To contact Stroke Continuity provider, please refer to [email or web address] After hours, contact General Neurology

## 2018-02-28 ENCOUNTER — Encounter (HOSPITAL_COMMUNITY)
Admission: EM | Disposition: A | Discharge status: Rehabilitation Facility | Payer: Self-pay | Source: Home / Self Care | Attending: Neurology

## 2018-02-28 DIAGNOSIS — I63412 Cerebral infarction due to embolism of left middle cerebral artery: Secondary | ICD-10-CM

## 2018-02-28 LAB — CBC
HCT: 39.3 % (ref 39.0–52.0)
Hemoglobin: 14 g/dL (ref 13.0–17.0)
MCH: 38.1 pg — ABNORMAL HIGH (ref 26.0–34.0)
MCHC: 35.6 g/dL (ref 30.0–36.0)
MCV: 107.1 fL — ABNORMAL HIGH (ref 78.0–100.0)
Platelets: 279 10*3/uL (ref 150–400)
RBC: 3.67 MIL/uL — ABNORMAL LOW (ref 4.22–5.81)
RDW: 12.4 % (ref 11.5–15.5)
WBC: 16.4 10*3/uL — AB (ref 4.0–10.5)

## 2018-02-28 LAB — BASIC METABOLIC PANEL
ANION GAP: 8 (ref 5–15)
BUN: 11 mg/dL (ref 6–20)
CO2: 28 mmol/L (ref 22–32)
Calcium: 8.9 mg/dL (ref 8.9–10.3)
Chloride: 103 mmol/L (ref 98–111)
Creatinine, Ser: 0.54 mg/dL — ABNORMAL LOW (ref 0.61–1.24)
Glucose, Bld: 95 mg/dL (ref 70–99)
Potassium: 4.3 mmol/L (ref 3.5–5.1)
SODIUM: 139 mmol/L (ref 135–145)

## 2018-02-28 LAB — CULTURE, BLOOD (ROUTINE X 2)
CULTURE: NO GROWTH
Special Requests: ADEQUATE

## 2018-02-28 LAB — PROTHROMBIN GENE MUTATION

## 2018-02-28 LAB — MTHFR DNA ANALYSIS

## 2018-02-28 SURGERY — ECHOCARDIOGRAM, TRANSESOPHAGEAL
Anesthesia: Monitor Anesthesia Care

## 2018-02-28 MED ORDER — SODIUM CHLORIDE 0.9 % IV SOLN
INTRAVENOUS | Status: DC
  Administered 2018-02-28: 11:00:00 via INTRAVENOUS

## 2018-02-28 NOTE — Progress Notes (Signed)
Progress Note  Patient Name: Xavier Villegas Date of Encounter: 02/28/2018  Primary Cardiologist: No primary care provider on file.   Subjective   Is able to minimally verbalize, okay.  No chest pain, no shortness of breath.  Post craniotomy, left.  Inpatient Medications    Scheduled Meds: . aspirin  325 mg Oral Daily  . chlorhexidine  15 mL Mouth Rinse BID  . feeding supplement (ENSURE ENLIVE)  237 mL Oral BID BM  . folic acid  4 mg Oral Daily  . mouth rinse  15 mL Mouth Rinse q12n4p  . pantoprazole  40 mg Oral Daily  . vitamin B-6  50 mg Oral Daily  . vitamin B-12  1,000 mcg Oral Daily   Continuous Infusions: . sodium chloride 10 mL/hr at 02/27/18 0200  . cefTRIAXone (ROCEPHIN)  IV Stopped (02/27/18 2300)  . vancomycin 1,000 mg (02/28/18 0842)   PRN Meds: sodium chloride, acetaminophen **OR** acetaminophen (TYLENOL) oral liquid 160 mg/5 mL **OR** acetaminophen, iohexol, ondansetron **OR** ondansetron (ZOFRAN) IV, promethazine, RESOURCE THICKENUP CLEAR   Vital Signs    Vitals:   02/27/18 1926 02/27/18 2308 02/28/18 0420 02/28/18 0754  BP: 132/88 138/87 (!) 151/92 (!) 134/94  Pulse: 66 71 (!) 57 (!) 59  Resp: 19 19 20 18   Temp: 98.7 F (37.1 C) 98.5 F (36.9 C) 98.6 F (37 C) 98.4 F (36.9 C)  TempSrc: Oral Oral Oral Oral  SpO2: 100% 98% 99% 98%  Weight:      Height:        Intake/Output Summary (Last 24 hours) at 02/28/2018 0937 Last data filed at 02/27/2018 2309 Gross per 24 hour  Intake -  Output 2350 ml  Net -2350 ml   Filed Weights   02/21/18 0500 02/22/18 0412 02/23/18 0500  Weight: 189 lb 9.5 oz (86 kg) 184 lb 15.5 oz (83.9 kg) 182 lb 8.7 oz (82.8 kg)    Telemetry    None- Personally Reviewed  ECG    No new- Personally Reviewed  Physical Exam   GEN: No acute distress.  Laying in bed, post left-sided craniotomy, relatively aphasic Neck: No JVD Cardiac: RRR, no murmurs, rubs, or gallops.  Respiratory: Clear to auscultation  bilaterally. GI: Soft, nontender, non-distended  MS: No edema; No deformity. Neuro:  Nonfocal  Psych: Normal affect   Labs    Chemistry Recent Labs  Lab 02/26/18 0526 02/27/18 0356 02/28/18 0500  NA 148* 142 139  K 3.6 3.8 4.3  CL 115* 107 103  CO2 26 28 28   GLUCOSE 101* 99 95  BUN 16 11 11   CREATININE 0.56* 0.47* 0.54*  CALCIUM 8.4* 8.6* 8.9  GFRNONAA >60 NOT CALCULATED >60  GFRAA >60 NOT CALCULATED >60  ANIONGAP 7 7 8      Hematology Recent Labs  Lab 02/26/18 0526 02/27/18 0356 02/28/18 0500  WBC 12.9* 14.7* 16.4*  RBC 3.22* 3.29* 3.67*  HGB 12.3* 12.6* 14.0  HCT 36.0* 36.0* 39.3  MCV 111.8* 109.4* 107.1*  MCH 38.2* 38.3* 38.1*  MCHC 34.2 35.0 35.6  RDW 13.0 12.3 12.4  PLT 241 243 279    Cardiac EnzymesNo results for input(s): TROPONINI in the last 168 hours. No results for input(s): TROPIPOC in the last 168 hours.   BNPNo results for input(s): BNP, PROBNP in the last 168 hours.   DDimer No results for input(s): DDIMER in the last 168 hours.   Radiology    No results found.  Cardiac Studies   - Left ventricle: The  cavity size was normal. Wall thickness was   increased in a pattern of mild LVH. Systolic function was normal.   The estimated ejection fraction was in the range of 55% to 60%.   Wall motion was normal; there were no regional wall motion   abnormalities. Left ventricular diastolic function parameters   were normal. - Aortic valve: There was a small, mobile vegetation on the left   ventricular aspect.  Impressions:  - There is a small - medium sized mobile mass on the LV aspect of   the aortic valve.   The appearence is c/w vegetation.   Aortic valve endocarditis  Patient Profile     37 y.o. male with stroke, left-sided ICA dissection and apparent vegetation on aortic valve.  Assessment & Plan    Stroke -It is been requested that a TEE be performed by both neurology as well as infectious disease to help elucidate possible  etiology for stroke.  He is now several days post craniotomy and is stable.  I think it would be reasonable to pursue transesophageal echocardiogram tomorrow.  Risk given potential rise in intracranial pressure seems to be as well controlled as to be expected.  Endocarditis - Currently treating for 6 weeks, ID.  Spoke with Dr. Gerri Spore.  Obviously if there is no evidence of vegetation on aortic valve, antibiotics can be minimized.  Thankfully, there is no evidence of aortic regurgitation.      For questions or updates, please contact [place] Please consult [email or web address] for contact info under Cardiology/STEMI.      Signed, Candee Furbish, MD  02/28/2018, 9:37 AM

## 2018-02-28 NOTE — Plan of Care (Signed)
Xavier Villegas has been calm, cooperative and appropriate today.  He smiles, nods and largely comprehends what is said around him.  He still has significant expressive aphasia, using only grunts and nods.  He has spent most of the day in the chair with visitors.  There is some question as to whether the patient has adequate support for CIR.  Social work is following.    Nursing POC:  encourage safety, progressive mobility, adequate intake and maintaining skin integrity.  Decrease risk of further infection.  Assist PT/OT/SLP as needed.    No turns to Left Side

## 2018-02-28 NOTE — Progress Notes (Signed)
STROKE TEAM PROGRESS NOTE   SUBJECTIVE (INTERVAL HISTORY) His girlfriend is at the bedside.  Pt neuro stable. Na 139 amd WBC 16.4. Still has aphasia, and right hemiplegia but no change from yesterday.  OBJECTIVE Temp:  [98.2 F (36.8 C)-98.7 F (37.1 C)] 98.4 F (36.9 C) (07/22 0754) Pulse Rate:  [57-71] 59 (07/22 0754) Cardiac Rhythm: Normal sinus rhythm (07/22 0700) Resp:  [18-20] 18 (07/22 0754) BP: (128-151)/(84-94) 134/94 (07/22 0754) SpO2:  [98 %-100 %] 98 % (07/22 0754)  CBC:  Recent Labs  Lab 02/22/18 0516  02/27/18 0356 02/28/18 0500  WBC 18.3*   < > 14.7* 16.4*  NEUTROABS 15.2*  --   --   --   HGB 14.5   < > 12.6* 14.0  HCT 42.0   < > 36.0* 39.3  MCV 112.0*   < > 109.4* 107.1*  PLT 197   < > 243 279   < > = values in this interval not displayed.    Basic Metabolic Panel:  Recent Labs  Lab 02/21/18 1702  02/22/18 0516  02/23/18 0542  02/27/18 0356 02/28/18 0500  NA 157*   < > 157*   < > 160*   < > 142 139  K  --    < > 3.2*  --  3.3*   < > 3.8 4.3  CL  --    < > 121*  --  126*   < > 107 103  CO2  --    < > 27  --  27   < > 28 28  GLUCOSE  --    < > 127*  --  113*   < > 99 95  BUN  --    < > 5*  --  12   < > 11 11  CREATININE  --    < > 0.65  --  0.80   < > 0.47* 0.54*  CALCIUM  --    < > 8.5*  --  8.6*   < > 8.6* 8.9  MG 1.8  --  2.0  --  2.1  --   --   --   PHOS 2.9  --   --   --  3.0  --   --   --    < > = values in this interval not displayed.    Lipid Panel:     Component Value Date/Time   CHOL 101 02/20/2018 0547   TRIG 94 02/22/2018 1745   HDL 34 (L) 02/20/2018 0547   CHOLHDL 3.0 02/20/2018 0547   VLDL 21 02/20/2018 0547   LDLCALC 46 02/20/2018 0547   HgbA1c:  Lab Results  Component Value Date   HGBA1C 4.9 02/20/2018   Urine Drug Screen:     Component Value Date/Time   LABOPIA NONE DETECTED 02/19/2018 1655   COCAINSCRNUR NONE DETECTED 02/19/2018 1655   LABBENZ NONE DETECTED 02/19/2018 1655   AMPHETMU NONE DETECTED 02/19/2018 1655    THCU NONE DETECTED 02/19/2018 1655   LABBARB (A) 02/19/2018 1655    Result not available. Reagent lot number recalled by manufacturer.    Alcohol Level No results found for: [facility]  IMAGING I have personally reviewed the radiological images below and agree with the radiology interpretations.  Ct Angio Head W Or Wo Contrast Ct Angio Neck W Or Wo Contrast Ct Cerebral Perfusion W Contrast 02/19/2018 IMPRESSION:  Large territory left MCA acute infarct. Acute dissection left internal carotid artery with intraluminal thrombus and slow  flow in the left internal carotid artery which occludes in the supraclinoid segment. Occlusion left A1 with reconstitution left A2. Occlusion left M1 and M2 segments.   IR Angiogram Dr Earleen Newport 02/19/2018 3:08 PM Findings:  Left proximal cervical ICA dissection/thrombus Left M1 occlusion First pass, TICI 2a, with emboli in distal M1/M2 Second pass, TICI 2a, with persistent occlusion of M2 branch Third pass, TICI 2b Occlusive left ICA dissection, with patent ACA and maintained cross-flow from right to left Posterior circulation perfuses the left temporal and occipital region Flat panel CT shows SAH/ICH Complications: ICH  Ct Head Wo Contrast 02/19/2018 IMPRESSION:  Left MCA territory infarction with early low-density and swelling but no mass effect or shift at this time. I think most the hyperdensity seen previously relates to contrast staining, much of which is diminishing or resolved. There is some persistent contrast staining in the region of the insula and I could not exclude that there is a small amount of admixed hemorrhage.   Ct Head Wo Contrast 02/19/2018 IMPRESSION:  Large territory acute infarct left MCA territory. Interval development of multiple areas of patchy hemorrhage within the infarct following clot retrieval. No midline shift.   Ct Head Code Stroke Wo Contrast 02/19/2018 IMPRESSION:  1. Hypodensities left frontal lobe and left occipital  parietal lobe most compatible with acute infarct. Possible emboli or watershed infarct. Negative for hemorrhage or mass-effect  2. ASPECTS is 8   CT Head 02/26/2018 : 1. Stable left MCA distribution infarction, mass effect, herniation through the left hemi craniectomy, effacement of left lateral ventricle, and 5 mm left-to-right midline shift. Stable slight dilatation of right lateral ventricle. 2. No new acute intracranial abnormality identified.  MRI / MRA Acute infarct of the entire left MCA territory with mild associated hemorrhage in the left parietal and frontal lobe. Diffuse edema. 2 mm midline shift to the right. No other infarct Occlusion of the left internal carotid artery and entire left middle cerebral artery  Transthoracic Echocardiogram - Left ventricle: The cavity size was normal. Wall thickness was   increased in a pattern of mild LVH. Systolic function was normal.   The estimated ejection fraction was in the range of 55% to 60%.   Wall motion was normal; there were no regional wall motion   abnormalities. Left ventricular diastolic function parameters   were normal. - Aortic valve: There was a small, mobile vegetation on the left   ventricular aspect. Impressions: - There is a small - medium sized mobile mass on the LV aspect of   the aortic valve.   The appearence is c/w vegetation.   Aortic valve endocarditis  VAS [place] LOWER EXTREMITY VENOUS (DVT) There is no DVT or SVT noted in the bilateral lower extremities.   CUS  R: no ICA stenosis. The left ICA appears occluded.  Right vertebral artery flow is antegrade.  Left vertebral artery not insonated secondary to patient's positioning on vent.  Ct Head Wo Contrast 02/21/2018 CLINICAL DATA:  Stroke follow-up EXAM: CT HEAD WITHOUT CONTRAST TECHNIQUE: Contiguous axial images were obtained from the base of the skull through the vertex without intravenous contrast. COMPARISON:  Head CT 02/19/2018 FINDINGS: Brain: There  is increased hypodensity throughout the left MCA distribution in the location of the known infarct. Hyperdense material within the infarcted region has also decreased in density, with no increased volume. Mass effect on the left lateral ventricle is increased and there is now rightward midline shift measuring 4 mm at the level of the foramina of  [place]. No hydrocephalus. Right hemisphere and cerebellum are normal. Vascular: Hyperattenuation of the left middle cerebral artery. Skull: The visualized skull base, calvarium and extracranial soft tissues are normal. Sinuses/Orbits: No fluid levels or advanced mucosal thickening of the visualized paranasal sinuses. No mastoid or middle ear effusion. The orbits are normal. IMPRESSION: 1. Progression of cytotoxic edema throughout the left MCA distribution, causing slight mass effect on the left lateral ventricle with 4 mm of rightward midline shift. No hydrocephalus. 2. Decreased density in unchanged distribution of hyperattenuating material in the left MCA territory, favored to indicate resolving contrast staining. Electronically Signed   By: Ulyses Jarred M.D.   On: 02/21/2018 05:15   Ct Head Wo Contrast  Result Date: 02/23/2018 CLINICAL DATA:  37 y/o  M; decompressive craniectomy. EXAM: CT HEAD WITHOUT CONTRAST TECHNIQUE: Contiguous axial images were obtained from the base of the skull through the vertex without intravenous contrast. COMPARISON:  02/21/2018 CT head.  02/20/2018 MRI of the head. FINDINGS: Brain: Severe progressed edema throughout the left MCA infarction. Further decrease in attenuation of hyperdense material in the center of the infarct. Mass effect from edema has increased with mild herniation of the hemisphere through the craniectomy, partial effacement of left lateral ventricle, and 5 mm of left-to-right midline shift. There is slight interval dilatation of the right lateral ventricle which may represent developing entrapment. There is a small volume  of acute extra-axial hemorrhage posterior cranial margin compatible postoperative change. Vascular: Density within left internal carotid artery and MCA compatible with thrombosis. Skull: Interval left lateral craniectomy with extensive edema throughout the left scalp, periorbital compartment, and face, several small foci of air, and a small air-fluid level compatible postoperative changes. Sinuses/Orbits: Mild mucosal thickening within the sphenoid sinus and the right maxillary sinus. Normal aeration of mastoid air cells. Orbital compartments are unremarkable. Other: None. IMPRESSION: 1. Interval postsurgical changes related to left hemi craniectomy. 2. Increased edema and mass effect associated with the left MCA infarct with mild herniation of the left hemisphere through the craniectomy, further effacement of left lateral ventricle, and 5 mm left-to-right midline shift. 3. Slight interval increased dilatation of right lateral ventricle may represent entrapment, attention at follow-up recommended. Electronically Signed   By: Kristine Garbe M.D.   On: 02/23/2018 06:05   Dg Chest Port 1 View  Result Date: 02/23/2018 CLINICAL DATA:  Respiratory failure EXAM: PORTABLE CHEST 1 VIEW COMPARISON:  02/21/2018 FINDINGS: Left central line remains in place, unchanged. Interval removal of NG tube and endotracheal tube. Heart is upper limits normal in size. Platelike atelectasis in the right lower lung. Left lung clear. No effusions. IMPRESSION: Continued right lower lung atelectasis. Electronically Signed   By: Rolm Baptise M.D.   On: 02/23/2018 10:46   Ct Head Wo Contrast  Result Date: 02/24/2018 CLINICAL DATA:  37 y/o  M; stroke for follow-up. EXAM: CT HEAD WITHOUT CONTRAST TECHNIQUE: Contiguous axial images were obtained from the base of the skull through the vertex without intravenous contrast. COMPARISON:  02/23/2018 CT head. FINDINGS: Brain: Stable left MCA distribution infarction, associated mass effect,  mild herniation through the left craniectomy, partial effacement of left lateral ventricle, and 5 mm left-to-right midline shift. Stable slightly distended right lateral ventricle. Further decrease in density high attenuation material within the left MCA distribution infarction. Stable small volume of extra-axial hemorrhage at the site of the craniectomy. No new stroke or hemorrhage identified. No downward herniation. Vascular: High attenuation left internal carotid artery and MCA compatible with thrombus. Skull: Postsurgical  changes related to a left hemi craniectomy with stable edema and pneumatosis of the overlying scalp as well as skin staples. Sinuses/Orbits: No acute finding. Other: Mild right maxillary sinus and sphenoid sinus mucosal thickening. Normal aeration of mastoid air cells. Orbital compartments are unremarkable. IMPRESSION: 1. Stable left MCA distribution infarction, mass effect, herniation through the left hemi craniectomy, effacement of left lateral ventricle, and 5 mm left-to-right midline shift. Stable slight dilatation of right lateral ventricle. 2. No new acute intracranial abnormality identified. Electronically Signed   By: Kristine Garbe M.D.   On: 02/24/2018 05:17   CT pending  PHYSICAL EXAM Temp:  [98.2 F (36.8 C)-98.7 F (37.1 C)] 98.4 F (36.9 C) (07/22 0754) Pulse Rate:  [57-71] 59 (07/22 0754) Resp:  [18-20] 18 (07/22 0754) BP: (128-151)/(84-94) 134/94 (07/22 0754) SpO2:  [98 %-100 %] 98 % (07/22 0754)  General - Well nourished, well developed, lethargic.  Ophthalmologic - fundi not visualized due to noncooperation and right eyelid swollen.  Cardiovascular - Regular rate and rhythm.  Neuro - sitting up in bed. Both eyes open with left eye slightly smaller due to soft tissue edema. PERRL. Left gaze preference, barely cross midline.severe expressive aphasia and does not speak any words. Able to follow all central commands and some peripheral command on the  left hand. Not blinking to visual threat on the right. Right facial droop. LUE and LLE spontaneous movement against gravity. RUE 1/5 with increased tone and RLE 2/5 on pain stimulation. DTR 1+ and right babinski positive. Sensation, coordination and gait not tested.      ASSESSMENT/PLAN Xavier Villegas is a 37 y.o. male with no significant past medical history other than tobacco use presenting with right-sided weakness and aphasia. He did not receive IV t-PA due to late presentation.  Attempted thrombectomy with subsequent left ICA dissection and possible ICH.  Stroke: large left MCA territory infarct due to left ICA and MCA occlusion, etiology unclear, could be due to ICA dissection or aortic valve vegetation shown on TTE   Resultant  Intubated, right hemiplegia, aphasia  CT head - Large territory acute infarct left MCA territory.  IR - occlusive left ICA but TICI2b left MCA  MRI head - large left MCA infarct with small ICH/SAH hemorrhagic conversion  MRA head - left ICA and MCA remain occluded  CTA H&N - Acute dissection left internal carotid artery with intraluminal thrombus  LE Venous Dopplers - no DVT  Carotid Doppler - left ICA occluded  2D Echo - EF 55-60% and aortic valve mass vs. vegetation  consider TEE early next week  Hypercoagulable work up negative except high homocysteine at 181.7. MRHFR analysis pending  LDL - 46  HgbA1c - 4.9  VTE prophylaxis - subq heparin  No antithrombotic prior to admission, now on ASA.   Ongoing aggressive stroke risk factor management  Therapy recommendations:  CIR  Disposition:  Pending  Cerebral edema  MRA large left MCA infarct with 46mm midline shift 02/20/18  Repeat CT showed mass effect with 5mm midline shift 02/21/18  CT repeat showed increased mass effect to 71mm midline shift 02/23/18  CT repeat stable mass effect with midline shift at 78mm 02/24/18  Off 3% saline  Na goal 150-160  Na Q6h  S/p [facility] -  mental status much improved  CT head pending in am  Aortic valve mass vs. vegetation  TTE concerning for AV vegetation vs. mass  First 2/2 blood culture negative  Repeat blood culture - 1/2 Co  neg Staph - likely contamination  On rocephin Q12h for now  WBC 15.4->19.5->14.2->18.3->14.7->12  Tmax 100.6->97.2->100.9->afebrile  Cardiology consult appreciated  TEE next monday  ID recs appreciated  Severe hyperhomocysteinemia - associated with low FA, B12 and smoking  Homocysteine 181.7  Low FA level - 1.5 - on FA supplement  Low normal B12 level - 224 -supplementation ordered  Smoker - will provide education  On supplement of FA and B12 and B6  B6 level pending  MRHFR mutation pending  Left ICA dissection ??  Girlfriend stated that he bumped his head to window 3 weeks ago  Left ICA occlusion   Interventional Neuroradiology feels consistent with dissection  Tobacco abuse  Current smoker  Smoking cessation counseling will be provided   Other Stroke Risk Factors  ETOH use, advised to drink no more than 1 alcoholic beverage per day.  Family hx of stroke (grandparents)  Other [facility] day # 9  . Await transfer to rehabilitation when bed available and insurance approval. Continue treatment for endocarditis with antibiotics  for 6 weeks as per ID and cardiology. Long discussion of the bedside with the patient's girlfriend as well as with the rehabilitation coordinator. Greater than 50% time during this 25 minute visit was spent on counseling and coordination of care about his stroke and answered questions Xavier Contras, MD Stroke Neurology 02/28/2018 2:31 PM   To contact Stroke Continuity provider, please refer to [email or web address] After hours, contact General Neurology

## 2018-02-28 NOTE — Progress Notes (Signed)
Occupational Therapy Treatment Patient Details Name: Xavier Villegas MRN: [id number] DOB: 25-Feb-1981 Today's Date: 02/28/2018    History of present illness 37 yo found down by girlfriend 7/13 with aphasia and right weakness with Lt MCA infarct with left ICA dissection s/p IR with reocclusion and hemorrhagic transformation with SAH, hemicraniectomy for decompression 7/15, pt with aortic valve vegetation. Pt self-extubated 7/16. No significant PMHx   OT comments  Pt making progress toward OT goals; continues to require max assist +2 for basic transfers but tolerated sitting EOB x15 minutes engaging in grooming tasks x2 and trunk strengthening exercises. Pt required min-mod assist for sitting balance but able to self correct to midline with verbal, visual, and min tactile cues. Pt utilizing LUE to engage RUE in functional tasks with cues today as well. D/c plan remains appropriate. Will continue to follow acutely.   Follow Up Recommendations  CIR    Equipment Recommendations  Other (comment)(TBD at next venue)    Recommendations for Other Services      Precautions / Restrictions Precautions Precautions: Fall Precaution Comments: bone flap in L abdomen Restrictions Weight Bearing Restrictions: No       Mobility Bed Mobility Overal bed mobility: Needs Assistance Bed Mobility: Sit to Supine      Sit to supine: Mod assist   General bed mobility comments: Light assist for trunk controlled descent to bed, assist for LEs into bed. Pt able to assist with scooting up to [facility] for repositioning in supine  Transfers Overall transfer level: Needs assistance Equipment used: 2 person hand held assist Transfers: Sit to/from [facility] Sit to Stand: Mod assist;+2 physical assistance Stand pivot transfers: Max assist;+2 physical assistance;From elevated surface       General transfer comment: Blocked R knee and facilitated LE movement for stand pivot transfer  chair>bed. Pt initiating sit to stand from chair x2 but difficulty weight shifting to perform stepping needed for pivot transfer    Balance Overall balance assessment: Needs assistance Sitting-balance support: Single extremity supported;Feet supported Sitting balance-Leahy Scale: Fair Sitting balance - Comments: min-mod assist for sitting balance with R lateral lean. With verbal and visual cues pt able to correct to midline but does not sustain Postural control: Right lateral lean Standing balance support: Bilateral upper extremity supported Standing balance-Leahy Scale: Zero Standing balance comment: total assist for standing balance with R knee buckling                           ADL either performed or assessed with clinical judgement   ADL Overall ADL's : Needs assistance/impaired     Grooming: Moderate assistance;Sitting;Wash/dry face;Oral care Grooming Details (indicate cue type and reason): Assist for sitting balance, pt with R lateral lean. Cues for use of R hand during tasks--hand over hand assist for incorporating R hand. Pt compensating with LUE to complete tasks without physical assist         Upper Body Dressing : Moderate assistance;Bed level Upper Body Dressing Details (indicate cue type and reason): to doff/don new gown. With cues, pt picking up RUE with L and threading it into clothing                 Functional mobility during ADLs: Maximal assistance;+2 for physical assistance(HHA, stand pivot)       Vision       Perception     Praxis      Cognition Arousal/Alertness: Awake/alert Behavior During Therapy: Flat affect Overall  Cognitive Status: Impaired/Different from baseline Area of Impairment: Following commands;Problem solving;Attention;Awareness                   Current Attention Level: Sustained   Following Commands: Follows one step commands consistently Safety/Judgement: Decreased awareness of safety;Decreased awareness  of deficits   Problem Solving: Decreased initiation;Requires verbal cues;Requires tactile cues          Exercises Exercises: Other exercises Other Exercises Other Exercises: Pt able to perform core stability/trunk strengthening exercises in sitting. Leaning laterally and anteriorly reaching. Min assist to facilitate correction to midline but pt able to perform multiple times each direction.   Shoulder Instructions       General Comments pt with L gaze preference and not tracking past midline to R side with cues     Pertinent Vitals/ Pain       Pain Assessment: Faces Faces Pain Scale: Hurts a little bit Pain Location: generalized Pain Descriptors / Indicators: Grimacing Pain Intervention(s): Monitored during session;Repositioned  Home Living                                          Prior Functioning/Environment              Frequency  Min 3X/week        Progress Toward Goals  OT Goals(current goals can now be found in the care plan section)  Progress towards OT goals: Progressing toward goals  Acute Rehab OT Goals Patient Stated Goal: Family excited for potential transfer to CIR OT Goal Formulation: With family  Plan Discharge plan remains appropriate    Co-evaluation                 AM-PAC PT "6 Clicks" Daily Activity     Outcome Measure   Help from another person eating meals?: A Lot Help from another person taking care of personal grooming?: A Lot Help from another person toileting, which includes using toliet, bedpan, or urinal?: A Lot Help from another person bathing (including washing, rinsing, drying)?: A Lot Help from another person to put on and taking off regular upper body clothing?: A Lot Help from another person to put on and taking off regular lower body clothing?: A Lot 6 Click Score: 12    End of Session Equipment Utilized During Treatment: Gait belt  OT Visit Diagnosis: Unsteadiness on feet (R26.81);Other  symptoms and signs involving cognitive function;Hemiplegia and hemiparesis Hemiplegia - Right/Left: Right Hemiplegia - dominant/non-dominant: Dominant Hemiplegia - caused by: Other Nontraumatic intracranial hemorrhage   Activity Tolerance Patient tolerated treatment well   Patient Left in bed;with call bell/phone within reach;with bed alarm set;with family/visitor present   Nurse Communication          Time: 0539-7673 OT Time Calculation (min): 27 min  Charges: OT General Charges $OT Visit: 1 Visit OT Treatments $Self Care/Home Management : 8-22 mins $Therapeutic Activity: 8-22 mins  Amelita Risinger A. Ulice Brilliant, M.S., OTR/L Acute Rehab Department: [phone]    Binnie Kand 02/28/2018, 2:53 PM

## 2018-02-28 NOTE — Progress Notes (Signed)
[facility] for Infectious Disease   Reason for visit: Follow up on vegetation  Interval History: no TEE planned today  Physical Exam: Constitutional:  Vitals:   02/28/18 0420 02/28/18 0754  BP: (!) 151/92 (!) 134/94  Pulse: (!) 57 (!) 59  Resp: 20 18  Temp: 98.6 F (37 C) 98.4 F (36.9 C)  SpO2: 99% 98%   patient appears in NAD Respiratory: Normal respiratory effort; CTA B Cardiovascular: RRR GI: soft, nt, nd Neuro: responds appropriately, answers yes, no  Review of Systems: Constitutional: negative for fevers and chills Gastrointestinal: negative for diarrhea Integument/breast: negative for rash Communication mainly non-verbal  Lab Results  Component Value Date   WBC 16.4 (H) 02/28/2018   HGB 14.0 02/28/2018   HCT 39.3 02/28/2018   MCV 107.1 (H) 02/28/2018   PLT 279 02/28/2018    Lab Results  Component Value Date   CREATININE 0.54 (L) 02/28/2018   BUN 11 02/28/2018   NA 139 02/28/2018   K 4.3 02/28/2018   CL 103 02/28/2018   CO2 28 02/28/2018    Lab Results  Component Value Date   ALT 33 02/19/2018   AST 28 02/19/2018   ALKPHOS 96 02/19/2018     Microbiology: Recent Results (from the past 240 hour(s))  MRSA PCR Screening     Status: Abnormal   Collection Time: 02/19/18  4:00 PM  Result Value Ref Range Status   MRSA by PCR POSITIVE (A) NEGATIVE Final    Comment:        The GeneXpert MRSA Assay (FDA approved for NASAL specimens only), is one component of a comprehensive MRSA colonization surveillance program. It is not intended to diagnose MRSA infection nor to guide or monitor treatment for MRSA infections. RESULT CALLED TO, READ BACK BY AND VERIFIED WITH: S.MAYES RN AT 1752 02/19/18 BY A.DAVIS Performed at [facility], [address], [place], [place] [address]   Culture, blood (Routine X 2) w Reflex to ID Panel     Status: None   Collection Time: 02/20/18  4:56 PM  Result Value Ref Range Status   Specimen Description BLOOD  CENTRAL LINE  Final   Special Requests   Final    BOTTLES DRAWN AEROBIC AND ANAEROBIC Blood Culture adequate volume   Culture   Final    NO GROWTH 5 DAYS Performed at [facility], [address], [place], [place] [address]    Report Status 02/25/2018 FINAL  Final  Culture, blood (Routine X 2) w Reflex to ID Panel     Status: None   Collection Time: 02/20/18  5:20 PM  Result Value Ref Range Status   Specimen Description BLOOD CENTRAL LINE  Final   Special Requests   Final    BOTTLES DRAWN AEROBIC AND ANAEROBIC Blood Culture adequate volume   Culture   Final    NO GROWTH 5 DAYS Performed at [facility], [place] [address], [place], [place] [address]    Report Status 02/25/2018 FINAL  Final  Culture, blood (routine x 2)     Status: None (Preliminary result)   Collection Time: 02/23/18  6:10 PM  Result Value Ref Range Status   Specimen Description BLOOD LEFT HAND  Final   Special Requests   Final    BOTTLES DRAWN AEROBIC ONLY Blood Culture adequate volume   Culture   Final    NO GROWTH 4 DAYS Performed at [facility], [place] [address] [place], [place]  [address]    Report Status PENDING  Incomplete  Culture, blood (routine x 2)     Status: Abnormal   Collection Time: 02/23/18  6:18 PM  Result Value Ref Range Status   Specimen Description BLOOD RIGHT ANTECUBITAL  Final   Special Requests   Final    BOTTLES DRAWN AEROBIC AND ANAEROBIC Blood Culture adequate volume   Culture  Setup Time   Final    GRAM POSITIVE COCCI ANAEROBIC BOTTLE ONLY CRITICAL RESULT CALLED TO, READ BACK BY AND VERIFIED WITH: Fertile 02/24/18 A BROWNING    Culture (A)  Final    STAPHYLOCOCCUS SPECIES (COAGULASE NEGATIVE) THE SIGNIFICANCE OF ISOLATING THIS ORGANISM FROM A SINGLE SET OF BLOOD CULTURES WHEN MULTIPLE SETS ARE DRAWN IS UNCERTAIN. PLEASE NOTIFY THE MICROBIOLOGY DEPARTMENT WITHIN ONE WEEK IF SPECIATION AND SENSITIVITIES ARE REQUIRED. Performed at [facility],  [place] [address] [place], [place] [address]    Report Status 02/26/2018 FINAL  Final  Blood Culture ID Panel (Reflexed)     Status: Abnormal   Collection Time: 02/23/18  6:18 PM  Result Value Ref Range Status   Enterococcus species NOT DETECTED NOT DETECTED Final   Listeria monocytogenes NOT DETECTED NOT DETECTED Final   Staphylococcus species DETECTED (A) NOT DETECTED Final    Comment: Methicillin (oxacillin) susceptible coagulase negative staphylococcus. Possible blood culture contaminant (unless isolated from more than one blood culture draw or clinical case suggests pathogenicity). No antibiotic treatment is indicated for blood  culture contaminants. CRITICAL RESULT CALLED TO, READ BACK BY AND VERIFIED WITH: Dion Body PHARMD 1945 02/24/18 A BROWNING    Staphylococcus aureus NOT DETECTED NOT DETECTED Final   Methicillin resistance NOT DETECTED NOT DETECTED Final   Streptococcus species NOT DETECTED NOT DETECTED Final   Streptococcus agalactiae NOT DETECTED NOT DETECTED Final   Streptococcus pneumoniae NOT DETECTED NOT DETECTED Final   Streptococcus pyogenes NOT DETECTED NOT DETECTED Final   Acinetobacter baumannii NOT DETECTED NOT DETECTED Final   Enterobacteriaceae species NOT DETECTED NOT DETECTED Final   Enterobacter cloacae complex NOT DETECTED NOT DETECTED Final   Escherichia coli NOT DETECTED NOT DETECTED Final   Klebsiella oxytoca NOT DETECTED NOT DETECTED Final   Klebsiella pneumoniae NOT DETECTED NOT DETECTED Final   Proteus species NOT DETECTED NOT DETECTED Final   Serratia marcescens NOT DETECTED NOT DETECTED Final   Haemophilus influenzae NOT DETECTED NOT DETECTED Final   Neisseria meningitidis NOT DETECTED NOT DETECTED Final   Pseudomonas aeruginosa NOT DETECTED NOT DETECTED Final   Candida albicans NOT DETECTED NOT DETECTED Final   Candida glabrata NOT DETECTED NOT DETECTED Final   Candida krusei NOT DETECTED NOT DETECTED Final   Candida parapsilosis NOT DETECTED NOT  DETECTED Final   Candida tropicalis NOT DETECTED NOT DETECTED Final    Comment: Performed at [facility], [address] [place], [place] [address]    Impression/Plan:  1. Vegetation - on empiric vancomycin and ceftriaxone.  One positive CoNS blood culture and I suspect contaminate.   Continue with vancomycin and ceftriaxone for 6 weeks through August 28th. We will arrange follow up in our clinic about that time Please call with any changes  2. Stroke - some improvement in aphasia.    At this time, I will sign off, please call with any questions. thanks

## 2018-02-28 NOTE — Care Management Note (Signed)
Case Management Note  Patient Details  Name: Xavier Villegas MRN: [id number] Date of Birth: 07-27-1981  Subjective/Objective: 37 yo found down by girlfriend 7/13 with aphasia and right weakness with Lt MCA infarct with left ICA dissection s/p IR with reocclusion and hemorrhagic transformation with SAH, hemicraniectomy for decompression 7/15, pt with aortic valve vegetation. PTA, pt independent, lives at home with parents, in their basement.                 Action/Plan: PT/OT recommending CIR, and consult in progress.  Family able to assist intermittently at discharge.  Possible CIR admission once caregiver support established by family.    Expected Discharge Date:                  Expected Discharge Plan:  [place]  In-House Referral:     Discharge planning Services  CM Consult  Post Acute Care Choice:    Choice offered to:     DME Arranged:    DME Agency:     HH Arranged:    [place] Agency:     Status of Service:  In process, will continue to follow  If discussed at Long Length of Stay Meetings, dates discussed:    Additional Comments:  Reinaldo Raddle, RN, BSN  Trauma/Neuro ICU Case Manager [phone]

## 2018-02-28 NOTE — Progress Notes (Signed)
Physical Therapy Treatment Patient Details Name: Xavier Villegas MRN: [id number] DOB: 27-Oct-1980 Today's Date: 02/28/2018    History of Present Illness 37 yo found down by girlfriend 7/13 with aphasia and right weakness with Lt MCA infarct with left ICA dissection s/p IR with reocclusion and hemorrhagic transformation with SAH, hemicraniectomy for decompression 7/15, pt with aortic valve vegetation. Pt self-extubated 7/16. No significant PMHx    PT Comments    Pt is making progress toward PT goals and tolerated session well. Patient continues to present with R side hemiplegia and L gaze preference. +2 assistance necessary for functional transfers OOB. Continue to progress as tolerated.    Follow Up Recommendations  CIR;Supervision/Assistance - 24 hour     Equipment Recommendations  Wheelchair cushion (measurements PT);Wheelchair (measurements PT);3in1 (PT)    Recommendations for Other Services       Precautions / Restrictions Precautions Precautions: Fall Precaution Comments: bone flap in L abdomen Restrictions Weight Bearing Restrictions: No    Mobility  Bed Mobility Overal bed mobility: Needs Assistance Bed Mobility: Supine to Sit     Supine to sit: HOB elevated;Mod assist     General bed mobility comments: assist to elevate trunk into sitting and to bring R hip toward EOB with use of bed pad; pt used rail to assist  Transfers Overall transfer level: Needs assistance Equipment used: 2 person hand held assist Transfers: Sit to/from [facility] Sit to Stand: Mod assist;+2 physical assistance Stand pivot transfers: Max assist;+2 physical assistance;From elevated surface       General transfer comment: assistance required to power up into standing with R knee blocked and R UE supported by therapist; pt able to achieve upright posture with mulimodal cues and facilitation at pelvis and trunk; pt able to step with L LE and pivot with assistance with  R LE stability   Ambulation/Gait             General Gait Details: unable   Stairs             Wheelchair Mobility    Modified Rankin (Stroke Patients Only) Modified Rankin (Stroke Patients Only) Pre-Morbid Rankin Score: No symptoms Modified Rankin: Severe disability     Balance Overall balance assessment: Needs assistance Sitting-balance support: Single extremity supported;Feet supported Sitting balance-Leahy Scale: Poor   Postural control: Right lateral lean Standing balance support: Bilateral upper extremity supported Standing balance-Leahy Scale: Zero Standing balance comment: worked on standing balance and weight shifting once transferred to recliner                            Cognition Arousal/Alertness: Awake/alert Behavior During Therapy: Flat affect Overall Cognitive Status: Impaired/Different from baseline Area of Impairment: Following commands;Problem solving;Safety/judgement;Attention;Awareness                   Current Attention Level: Sustained   Following Commands: Follows one step commands with increased time Safety/Judgement: Decreased awareness of deficits;Decreased awareness of safety   Problem Solving: Decreased initiation;Requires verbal cues;Requires tactile cues        Exercises      General Comments General comments (skin integrity, edema, etc.): pt with L gaze preference and not tracking past midline to R side with cues       Pertinent Vitals/Pain Pain Assessment: Faces Faces Pain Scale: Hurts little more Pain Location: R shoulder with ROM  Pain Descriptors / Indicators: Grimacing Pain Intervention(s): Limited activity within patient's tolerance;Repositioned;Monitored during session  Home Living                      Prior Function            PT Goals (current goals can now be found in the care plan section) Acute Rehab PT Goals Patient Stated Goal: Unable to state PT Goal Formulation:  With family Time For Goal Achievement: 03/08/18 Potential to Achieve Goals: Fair Progress towards PT goals: Progressing toward goals    Frequency    Min 4X/week      PT Plan Current plan remains appropriate    Co-evaluation              AM-PAC PT "6 Clicks" Daily Activity  Outcome Measure  Difficulty turning over in bed (including adjusting bedclothes, sheets and blankets)?: Unable Difficulty moving from lying on back to sitting on the side of the bed? : Unable Difficulty sitting down on and standing up from a chair with arms (e.g., wheelchair, bedside commode, etc,.)?: Unable Help needed moving to and from a bed to chair (including a wheelchair)?: A Lot Help needed walking in hospital room?: Total Help needed climbing 3-5 steps with a railing? : Total 6 Click Score: 7    End of Session Equipment Utilized During Treatment: Gait belt Activity Tolerance: Patient tolerated treatment well Patient left: with call bell/phone within reach;in chair;with chair alarm set Nurse Communication: Mobility status PT Visit Diagnosis: Other abnormalities of gait and mobility (R26.89);Hemiplegia and hemiparesis;Other symptoms and signs involving the nervous system (R29.898) Hemiplegia - Right/Left: Right Hemiplegia - dominant/non-dominant: Dominant Hemiplegia - caused by: Cerebral infarction     Time: 7510-2585 PT Time Calculation (min) (ACUTE ONLY): 26 min  Charges:  $Therapeutic Activity: 23-37 mins                    G Codes:       Earney Navy, PTA Pager: [phone]     Darliss Cheney 02/28/2018, 1:03 PM

## 2018-02-28 NOTE — Progress Notes (Signed)
Inpatient Rehabilitation Admissions Coordinator  I met with patient up in chair with his girlfriend at bedside. Patient awake and alert. I will contact his Dad today to further discuss their plans for caregiver support. Once updated therapy evals are available for this week, I will begin insurance authorization for a possible inpt rehab admit pending insurance approval and caregiver clarification with his parents.  Danne Baxter, RN, MSN Rehab Admissions Coordinator [phone] 02/28/2018 12:32 PM

## 2018-02-28 NOTE — Progress Notes (Signed)
Inpatient Rehabilitation Admissions Coordinator  I have requested updated OT notes so that I can begin insurance Auth for [employer]. I contacted pt's Dad by phone and he is in agreement to plan.   Danne Baxter, RN, MSN Rehab Admissions Coordinator [phone] 02/28/2018 1:43 PM

## 2018-03-01 ENCOUNTER — Encounter (HOSPITAL_COMMUNITY): Payer: Self-pay | Admitting: *Deleted

## 2018-03-01 ENCOUNTER — Inpatient Hospital Stay (HOSPITAL_COMMUNITY)
Admission: RE | Admit: 2018-03-01 | Discharge: 2018-04-06 | DRG: 057 | Disposition: A | Discharge status: Home / Self Care | Payer: BLUE CROSS/BLUE SHIELD | Source: Intra-hospital | Attending: Physical Medicine & Rehabilitation | Admitting: Physical Medicine & Rehabilitation

## 2018-03-01 ENCOUNTER — Other Ambulatory Visit: Payer: Self-pay

## 2018-03-01 ENCOUNTER — Inpatient Hospital Stay (HOSPITAL_COMMUNITY): Payer: BLUE CROSS/BLUE SHIELD

## 2018-03-01 ENCOUNTER — Encounter (HOSPITAL_COMMUNITY)
Admission: EM | Disposition: A | Discharge status: Rehabilitation Facility | Payer: Self-pay | Source: Home / Self Care | Attending: Neurology

## 2018-03-01 DIAGNOSIS — R1310 Dysphagia, unspecified: Secondary | ICD-10-CM | POA: Diagnosis present

## 2018-03-01 DIAGNOSIS — I63511 Cerebral infarction due to unspecified occlusion or stenosis of right middle cerebral artery: Secondary | ICD-10-CM | POA: Diagnosis present

## 2018-03-01 DIAGNOSIS — I69320 Aphasia following cerebral infarction: Secondary | ICD-10-CM

## 2018-03-01 DIAGNOSIS — R4701 Aphasia: Secondary | ICD-10-CM | POA: Diagnosis present

## 2018-03-01 DIAGNOSIS — R159 Full incontinence of feces: Secondary | ICD-10-CM

## 2018-03-01 DIAGNOSIS — D72829 Elevated white blood cell count, unspecified: Secondary | ICD-10-CM | POA: Diagnosis present

## 2018-03-01 DIAGNOSIS — I7771 Dissection of carotid artery: Secondary | ICD-10-CM

## 2018-03-01 DIAGNOSIS — Z23 Encounter for immunization: Secondary | ICD-10-CM

## 2018-03-01 DIAGNOSIS — K219 Gastro-esophageal reflux disease without esophagitis: Secondary | ICD-10-CM | POA: Diagnosis not present

## 2018-03-01 DIAGNOSIS — R7881 Bacteremia: Secondary | ICD-10-CM

## 2018-03-01 DIAGNOSIS — I69390 Apraxia following cerebral infarction: Secondary | ICD-10-CM | POA: Diagnosis not present

## 2018-03-01 DIAGNOSIS — I69391 Dysphagia following cerebral infarction: Secondary | ICD-10-CM | POA: Diagnosis not present

## 2018-03-01 DIAGNOSIS — I69331 Monoplegia of upper limb following cerebral infarction affecting right dominant side: Secondary | ICD-10-CM

## 2018-03-01 DIAGNOSIS — I69351 Hemiplegia and hemiparesis following cerebral infarction affecting right dominant side: Principal | ICD-10-CM

## 2018-03-01 DIAGNOSIS — R339 Retention of urine, unspecified: Secondary | ICD-10-CM | POA: Diagnosis not present

## 2018-03-01 DIAGNOSIS — I69332 Monoplegia of upper limb following cerebral infarction affecting left dominant side: Secondary | ICD-10-CM | POA: Diagnosis not present

## 2018-03-01 DIAGNOSIS — G8110 Spastic hemiplegia affecting unspecified side: Secondary | ICD-10-CM

## 2018-03-01 DIAGNOSIS — E538 Deficiency of other specified B group vitamins: Secondary | ICD-10-CM | POA: Diagnosis not present

## 2018-03-01 DIAGNOSIS — L723 Sebaceous cyst: Secondary | ICD-10-CM | POA: Diagnosis not present

## 2018-03-01 DIAGNOSIS — E7211 Homocystinuria: Secondary | ICD-10-CM | POA: Diagnosis present

## 2018-03-01 DIAGNOSIS — Z823 Family history of stroke: Secondary | ICD-10-CM

## 2018-03-01 DIAGNOSIS — N319 Neuromuscular dysfunction of bladder, unspecified: Secondary | ICD-10-CM | POA: Diagnosis not present

## 2018-03-01 DIAGNOSIS — I63412 Cerebral infarction due to embolism of left middle cerebral artery: Secondary | ICD-10-CM | POA: Diagnosis present

## 2018-03-01 DIAGNOSIS — K592 Neurogenic bowel, not elsewhere classified: Secondary | ICD-10-CM

## 2018-03-01 DIAGNOSIS — G8191 Hemiplegia, unspecified affecting right dominant side: Secondary | ICD-10-CM | POA: Diagnosis not present

## 2018-03-01 DIAGNOSIS — F17210 Nicotine dependence, cigarettes, uncomplicated: Secondary | ICD-10-CM | POA: Diagnosis present

## 2018-03-01 DIAGNOSIS — E871 Hypo-osmolality and hyponatremia: Secondary | ICD-10-CM | POA: Diagnosis not present

## 2018-03-01 DIAGNOSIS — L729 Follicular cyst of the skin and subcutaneous tissue, unspecified: Secondary | ICD-10-CM | POA: Diagnosis not present

## 2018-03-01 HISTORY — PX: TEE WITHOUT CARDIOVERSION: SHX5443

## 2018-03-01 LAB — CBC WITH DIFFERENTIAL/PLATELET
ABS IMMATURE GRANULOCYTES: 0.1 10*3/uL (ref 0.0–0.1)
Basophils Absolute: 0.1 10*3/uL (ref 0.0–0.1)
Basophils Relative: 1 %
Eosinophils Absolute: 0.4 10*3/uL (ref 0.0–0.7)
Eosinophils Relative: 3 %
HEMATOCRIT: 45.6 % (ref 39.0–52.0)
Hemoglobin: 16 g/dL (ref 13.0–17.0)
IMMATURE GRANULOCYTES: 1 %
LYMPHS ABS: 1.8 10*3/uL (ref 0.7–4.0)
Lymphocytes Relative: 11 %
MCH: 37.5 pg — ABNORMAL HIGH (ref 26.0–34.0)
MCHC: 35.1 g/dL (ref 30.0–36.0)
MCV: 106.8 fL — AB (ref 78.0–100.0)
MONO ABS: 1.4 10*3/uL — AB (ref 0.1–1.0)
Monocytes Relative: 8 %
NEUTROS ABS: 13.3 10*3/uL — AB (ref 1.7–7.7)
Neutrophils Relative %: 78 %
Platelets: 306 10*3/uL (ref 150–400)
RBC: 4.27 MIL/uL (ref 4.22–5.81)
RDW: 12.6 % (ref 11.5–15.5)
WBC: 17 10*3/uL — ABNORMAL HIGH (ref 4.0–10.5)

## 2018-03-01 SURGERY — ECHOCARDIOGRAM, TRANSESOPHAGEAL
Anesthesia: Moderate Sedation

## 2018-03-01 MED ORDER — PROCHLORPERAZINE EDISYLATE 10 MG/2ML IJ SOLN: 5.0000 mg | Freq: Four times a day (QID) | INTRAMUSCULAR | Status: DC | PRN

## 2018-03-01 MED ORDER — MIDAZOLAM HCL 10 MG/2ML IJ SOLN
INTRAMUSCULAR | Status: DC | PRN
  Administered 2018-03-01 (×3): 1 mg via INTRAVENOUS

## 2018-03-01 MED ORDER — GUAIFENESIN-DM 100-10 MG/5ML PO SYRP: 5.0000 mL | ORAL_SOLUTION | Freq: Four times a day (QID) | ORAL | Status: DC | PRN

## 2018-03-01 MED ORDER — FOLIC ACID 1 MG PO TABS
4.0000 mg | ORAL_TABLET | Freq: Every day | ORAL | Status: DC
  Administered 2018-03-02 – 2018-03-11 (×10): 4 mg via ORAL
  Administered 2018-03-12: 1 mg via ORAL
  Administered 2018-03-13 – 2018-04-06 (×25): 4 mg via ORAL
  Filled 2018-03-01 (×36): qty 4

## 2018-03-01 MED ORDER — ENSURE ENLIVE PO LIQD: 237.0000 mL | Freq: Two times a day (BID) | ORAL | 12 refills | Status: DC

## 2018-03-01 MED ORDER — MIDAZOLAM HCL 5 MG/ML IJ SOLN
INTRAMUSCULAR | Status: AC
  Filled 2018-03-01: qty 2

## 2018-03-01 MED ORDER — VITAMIN B-12 1000 MCG PO TABS
1000.0000 ug | ORAL_TABLET | Freq: Every day | ORAL | Status: DC
  Administered 2018-03-02 – 2018-04-06 (×36): 1000 ug via ORAL
  Filled 2018-03-01 (×37): qty 1

## 2018-03-01 MED ORDER — ALUM & MAG HYDROXIDE-SIMETH 200-200-20 MG/5ML PO SUSP: 30.0000 mL | ORAL | Status: DC | PRN

## 2018-03-01 MED ORDER — FENTANYL CITRATE (PF) 100 MCG/2ML IJ SOLN
INTRAMUSCULAR | Status: AC
  Filled 2018-03-01: qty 2

## 2018-03-01 MED ORDER — POLYETHYLENE GLYCOL 3350 17 G PO PACK
17.0000 g | PACK | Freq: Every day | ORAL | Status: DC | PRN
  Administered 2018-03-01: 17 g via ORAL
  Filled 2018-03-01: qty 1

## 2018-03-01 MED ORDER — ASPIRIN EC 325 MG PO TBEC
325.0000 mg | DELAYED_RELEASE_TABLET | Freq: Every day | ORAL | Status: DC
  Administered 2018-03-02 – 2018-03-04 (×3): 325 mg via ORAL
  Filled 2018-03-01 (×3): qty 1

## 2018-03-01 MED ORDER — PROCHLORPERAZINE MALEATE 5 MG PO TABS: 5.0000 mg | ORAL_TABLET | Freq: Four times a day (QID) | ORAL | Status: DC | PRN

## 2018-03-01 MED ORDER — PYRIDOXINE HCL 50 MG PO TABS: 50.0000 mg | ORAL_TABLET | Freq: Every day | ORAL | Status: DC

## 2018-03-01 MED ORDER — VITAMIN B-6 50 MG PO TABS
50.0000 mg | ORAL_TABLET | Freq: Every day | ORAL | Status: DC
  Administered 2018-03-02 – 2018-04-06 (×36): 50 mg via ORAL
  Filled 2018-03-01 (×36): qty 1

## 2018-03-01 MED ORDER — ACETAMINOPHEN 325 MG PO TABS
325.0000 mg | ORAL_TABLET | ORAL | Status: DC | PRN
  Administered 2018-03-04 – 2018-03-10 (×9): 650 mg via ORAL
  Filled 2018-03-01 (×11): qty 2

## 2018-03-01 MED ORDER — CYANOCOBALAMIN 1000 MCG PO TABS: 1000.0000 ug | ORAL_TABLET | Freq: Every day | ORAL | Status: DC

## 2018-03-01 MED ORDER — ASPIRIN 325 MG PO TABS: 325.0000 mg | ORAL_TABLET | Freq: Every day | ORAL | Status: AC

## 2018-03-01 MED ORDER — TRAZODONE HCL 50 MG PO TABS
25.0000 mg | ORAL_TABLET | Freq: Every evening | ORAL | Status: DC | PRN
  Administered 2018-03-02 – 2018-04-03 (×10): 50 mg via ORAL
  Filled 2018-03-01 (×13): qty 1

## 2018-03-01 MED ORDER — FENTANYL CITRATE (PF) 100 MCG/2ML IJ SOLN
INTRAMUSCULAR | Status: DC | PRN
  Administered 2018-03-01: 25 ug via INTRAVENOUS

## 2018-03-01 MED ORDER — FLEET ENEMA 7-19 GM/118ML RE ENEM: 1.0000 | ENEMA | Freq: Once | RECTAL | Status: DC | PRN

## 2018-03-01 MED ORDER — RESOURCE THICKENUP CLEAR PO POWD
ORAL | Status: DC | PRN
  Filled 2018-03-01 (×2): qty 125

## 2018-03-01 MED ORDER — FOLIC ACID 1 MG PO TABS: 4.0000 mg | ORAL_TABLET | Freq: Every day | ORAL | Status: DC

## 2018-03-01 MED ORDER — DIPHENHYDRAMINE HCL 12.5 MG/5ML PO ELIX: 12.5000 mg | ORAL_SOLUTION | Freq: Four times a day (QID) | ORAL | Status: DC | PRN

## 2018-03-01 MED ORDER — PROCHLORPERAZINE 25 MG RE SUPP: 12.5000 mg | Freq: Four times a day (QID) | RECTAL | Status: DC | PRN

## 2018-03-01 MED ORDER — PANTOPRAZOLE SODIUM 40 MG PO TBEC
40.0000 mg | DELAYED_RELEASE_TABLET | Freq: Every day | ORAL | Status: DC
  Administered 2018-03-02 – 2018-04-06 (×36): 40 mg via ORAL
  Filled 2018-03-01 (×37): qty 1

## 2018-03-01 MED ORDER — RESOURCE THICKENUP CLEAR PO POWD: 1.0000 | ORAL | Status: DC | PRN

## 2018-03-01 MED ORDER — BISACODYL 10 MG RE SUPP
10.0000 mg | Freq: Every day | RECTAL | Status: DC | PRN
  Filled 2018-03-01: qty 1

## 2018-03-01 MED ORDER — ORAL CARE MOUTH RINSE
15.0000 mL | Freq: Two times a day (BID) | OROMUCOSAL | Status: DC
  Administered 2018-03-01 – 2018-03-28 (×42): 15 mL via OROMUCOSAL

## 2018-03-01 NOTE — Op Note (Signed)
INDICATIONS: aortic valve mass  PROCEDURE:   Informed consent was obtained prior to the procedure. The risks, benefits and alternatives for the procedure were discussed and the patient comprehended these risks.  Risks include, but are not limited to, cough, sore throat, vomiting, nausea, somnolence, esophageal and stomach trauma or perforation, bleeding, low blood pressure, aspiration, pneumonia, infection, trauma to the teeth and death.    After a procedural time-out, the oropharynx was anesthetized with 20% benzocaine spray.   During this procedure the patient was administered a total of Versed 3 mg and Fentanyl 25 mcg to achieve and maintain moderate conscious sedation.  The patient's heart rate, blood pressure, and oxygen saturationweare monitored continuously during the procedure. The period of conscious sedation was 11 minutes, of which I was present face-to-face 100% of this time.  The transesophageal probe was inserted in the esophagus and stomach without difficulty and multiple views were obtained.  The patient was kept under observation until the patient left the procedure room.  The patient left the procedure room in stable condition.   Agitated microbubble saline contrast was administered.  COMPLICATIONS:    There were no immediate complications.  FINDINGS:  Normal TEE. No vegetation or mass on the aortic valve. No aortic dissection.   RECOMMENDATIONS:    No echo evidence for endocarditis or cardiac source of embolism.   Time Spent Directly with the Patient:  30 minutes   Ilo Beamon 03/01/2018, 8:24 AM

## 2018-03-01 NOTE — Progress Notes (Signed)
Inpatient Rehabilitation Admissions Coordinator  I have insurance approval and bed available to admit pt today. I have alerted Burnetta Sabin, [facility], RN CM and SW. Girlfriend at bedside and is aware. I will contact pt's phone to make him aware as we spoke yesterday about the plan s for CIR.  Danne Baxter, RN, MSN Rehab Admissions Coordinator [phone] 03/01/2018 2:26 PM

## 2018-03-01 NOTE — Progress Notes (Addendum)
Progress Note  Patient Name: Xavier Villegas Date of Encounter: 03/01/2018  Primary Cardiologist: Jenkins Rouge, MD   Subjective   Sleeping. No complaints  Inpatient Medications    Scheduled Meds: . aspirin  325 mg Oral Daily  . chlorhexidine  15 mL Mouth Rinse BID  . feeding supplement (ENSURE ENLIVE)  237 mL Oral BID BM  . folic acid  4 mg Oral Daily  . mouth rinse  15 mL Mouth Rinse q12n4p  . pantoprazole  40 mg Oral Daily  . vitamin B-6  50 mg Oral Daily  . vitamin B-12  1,000 mcg Oral Daily   Continuous Infusions: . sodium chloride 10 mL/hr at 02/27/18 0200   PRN Meds: sodium chloride, acetaminophen **OR** acetaminophen (TYLENOL) oral liquid 160 mg/5 mL **OR** acetaminophen, iohexol, ondansetron **OR** ondansetron (ZOFRAN) IV, promethazine, RESOURCE THICKENUP CLEAR   Vital Signs    Vitals:   03/01/18 0834 03/01/18 0844 03/01/18 0854 03/01/18 0949  BP: 127/71 124/72 119/82 130/86  Pulse: 81 78 79 68  Resp: 13 11 12 16   Temp: 98.9 F (37.2 C)   97.9 F (36.6 C)  TempSrc: Oral   Axillary  SpO2: 96% 99% 98% 100%  Weight:      Height:        Intake/Output Summary (Last 24 hours) at 03/01/2018 1119 Last data filed at 02/28/2018 2239 Gross per 24 hour  Intake -  Output 600 ml  Net -600 ml   Filed Weights   02/22/18 0412 02/23/18 0500 03/01/18 0741  Weight: 184 lb 15.5 oz (83.9 kg) 182 lb 8.7 oz (82.8 kg) 182 lb (82.6 kg)     GEN: No acute distress.   Neck: No JVD, left crain Cardiac: RRR, no murmurs, rubs, or gallops.  Respiratory: Clear to auscultation bilaterally. GI: Soft, nontender, non-distended  MS: No edema; No deformity. Neuro:  sleepy Psych: sleepy  Labs    Chemistry Recent Labs  Lab 02/26/18 0526 02/27/18 0356 02/28/18 0500  NA 148* 142 139  K 3.6 3.8 4.3  CL 115* 107 103  CO2 26 28 28   GLUCOSE 101* 99 95  BUN 16 11 11   CREATININE 0.56* 0.47* 0.54*  CALCIUM 8.4* 8.6* 8.9  GFRNONAA >60 NOT CALCULATED >60  GFRAA >60 NOT  CALCULATED >60  ANIONGAP 7 7 8      Hematology Recent Labs  Lab 02/27/18 0356 02/28/18 0500 03/01/18 0623  WBC 14.7* 16.4* 17.0*  RBC 3.29* 3.67* 4.27  HGB 12.6* 14.0 16.0  HCT 36.0* 39.3 45.6  MCV 109.4* 107.1* 106.8*  MCH 38.3* 38.1* 37.5*  MCHC 35.0 35.6 35.1  RDW 12.3 12.4 12.6  PLT 243 279 306    Cardiac EnzymesNo results for input(s): TROPONINI in the last 168 hours. No results for input(s): TROPIPOC in the last 168 hours.   BNPNo results for input(s): BNP, PROBNP in the last 168 hours.   DDimer No results for input(s): DDIMER in the last 168 hours.   Radiology    No results found.  Cardiac Studies   TEE negative  Patient Profile     38 y.o. male with stroke, left carotid dissection, TEE negative  Assessment & Plan    TEE normal reassuring. No endocarditis Consider repeating TTE in one year to make sure aortic valve remains clear.   [facility] will sign off.   Medication Recommendations:  none Other recommendations (labs, testing, etc):  TTE ECHO in one year Follow up as an outpatient:  ECHO in one year  For questions or updates, please contact [place] Please consult [email or web address] for contact info under Cardiology/STEMI.      Signed, Candee Furbish, MD  03/01/2018, 11:19 AM

## 2018-03-01 NOTE — Progress Notes (Signed)
Physical Medicine and Rehabilitation Consult   Reason for Consult: Stroke with functional deficits.  Referring Physician: Dr. Erlinda Hong   HPI: Xavier Villegas is a 37 y.o. male with history of tobacco use otherwise in good health who was admitted on 02/19/18 after found down on the floor by girlfriend. He was found to have aphasia with right sided weakness and CTA/P head/neck showed large L-MCA infarct with acute dissection of L-ICA with intraluminal thrombus. He underwent cerebral angio with mechanical thrombectomy of left MCA with reperfusion and confirmation of acute left left ICA dissection as source of emboli.  Question stroke due to dissection due to reports of hyperextension injury 3 weeks PTA. CT post procedure showed SAH with hemorrhagic transformation and follow up CT head done revealing progressive cytotoxic edema with slight mass effect and midline shift.  On attempts at propofol wean, he was found to have difficulty opening his eyes with dilated left pupil. Dr. Saintclair Halsted consulted for input and patient underwent left decompressive hemicraniectomy with implantation of crani flap in LMQ on 7/15   2D echo showed EF 55-60% with medium size mobile mass on LV aspect of aortic valve c/w vegetation. BLE dopplers negative for DVT. Carotid dopplers showed total occlusion of L-ICA. Dr. Johnsie Cancel consulted for input and endocarditis recommends TEE in a few days as well as recommended ID consult for input on antibiotics.   Patient self extubated today and respiratory status stable. Therapy evaluations done revealing dysphonia, mild oropharyngeal dysphagia, right hemiplegia, left ptosis with edema as well as question of right visual deficits affecting mobility. CIR recommended due to functional deficits.    Review of Systems  Unable to perform ROS: Patient nonverbal  Limited history as patient non-verbal and no family in the room.  Past Medical History:  Diagnosis Date  . Tobacco  abuse          Family History  Problem Relation Age of Onset  . Stroke Maternal Grandmother   . Stroke Maternal Grandfather     Social History:  Per reports he has been smoking cigarettes.  He has been smoking about 0.50 packs per day. He has never used smokeless tobacco. Per reports he drinks about 8.4 oz of alcohol per week. Per reports he does not use drugs.    Allergies: No Known Allergies    No medications prior to admission.    Home: Home Living Family/patient expects to be discharged to:: Private residence Living Arrangements: Parent Available Help at Discharge: Family, Available PRN/intermittently Type of Home: House Home Access: Stairs to enter, Ramped entrance [employer] of Steps: 4-5 steps to basement apartment, ramp on main floor Home Layout: Two level, Able to live on main level with bedroom/bathroom Bathroom Shower/Tub: Multimedia programmer: Handicapped height [place]: [place] - 2 wheels, Bedside commode, Other (comment) Additional Comments: equipment is mom's but she isn't using. 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 pt able to bring LLE off of bed with multimodal cues but requires assist for  RLE and trunk elevation Transfers Overall transfer level: Needs assistance Equipment used: 2 person hand held assist Transfers: Sit to/from Stand, Stand Pivot Transfers Sit to  Stand: Max assist, +2 physical assistance, +2 safety/equipment, From elevated surface Stand pivot transfers: Max assist, +2 physical assistance, +2 safety/equipment, From elevated surface General transfer comment: Pt initiated with L side . pt requires blocking of R side and input at the R hip as pt with tendency for left lean with right hip lateral protrusion . Pt with input at the sacrum for trunk activation for static standing. Pt able to step with L LE toward chair with R side completely supported by therapist. Bil UE supported with sit to stand x 2 from bed Ambulation/Gait General Gait Details: unable  ADL: ADL Overall ADL's : Needs assistance/impaired Eating/Feeding: NPO Eating/Feeding Details (indicate cue type and reason): SLP arriving at the end of session Grooming: Maximal assistance Upper Body Bathing: Maximal assistance Lower Body Bathing: Total assistance Upper Body Dressing : Maximal assistance Lower Body Dressing: Total assistance Lower Body Dressing Details (indicate cue type and reason): don socks this session with knee flexion bil knees in response to tactile input to the bottom of foot. pt does not like tactile input to the sole of foot.  General ADL Comments: pt initiated task with incr time and tactile input. pt completed EOB and EOB to chair transfer. Pt scanning R to locate father in the room. noted L eye completely swollen closed  Cognition: Cognition Overall Cognitive Status: Difficult to assess Arousal/Alertness: Lethargic Orientation Level: (UTA, aphasia) Attention: Focused, Sustained Focused Attention: Appears intact Sustained Attention: Impaired Sustained Attention Impairment: Verbal basic, Functional basic Cognition Arousal/Alertness: Lethargic Behavior During Therapy: Flat affect Overall Cognitive Status: Difficult to assess Area of Impairment: Attention, Following commands, Safety/judgement Current Attention Level: Focused Following Commands:  Follows one step commands inconsistently Safety/Judgement: Decreased awareness of safety, Decreased awareness of deficits General Comments: pt does not consistently follow verbal or tactile commands. He will lift LUE when asked to give thumbs up, will at time bend knee or perform action verbally stated. Pt will use LUE to attempt to find noxious stimuli on right   Blood pressure 134/80, pulse (!) 52, temperature 99.9 F (37.7 C), temperature source Axillary, resp. rate 18, height 6' (1.829 m), weight 83.9 kg (184 lb 15.5 oz), SpO2 96 %. Physical Exam  Nursing note and vitals reviewed. Constitutional: He appears well-developed and well-nourished. He appears lethargic.  WNWD male with left gaze preference and mitten on left hand.   HENT:  Dry dressing left scalp. Marked edema left fronto temporal  periorbital  Eyes:  Left lid edema with inability to open. Right pupil slow to respond.   Cardiovascular: Normal rate, regular rhythm and normal heart sounds. Exam reveals no friction rub.  No murmur heard. Respiratory: Effort normal and breath sounds normal. No respiratory distress. He has no wheezes.  GI: Soft. Bowel sounds are normal. He exhibits no distension. There is no tenderness.  Neurological: He appears lethargic.  Able to open mouth to command but unable to phonate. Left gaze preference and significant left lid edema. Able to move right pupil to midline with cues. Right visual field deficits. Dense right hemiparesis. RLE withdraws to pain.   0/5 RUE and RLE Hyporeflexic on RIght side  Non AMb  Limited tracking with R eye  Skin: Skin is warm and dry.  Left crani incision small amt blood on gauze  RUQ abd incsion  LabResultsLast24Hours  Results for orders placed or performed during the hospital encounter of 02/19/18 (from the past 24 hour(s))  Sodium     Status: Abnormal   Collection Time: 02/21/18  5:02 PM  Result Value Ref Range   Sodium 157 (H) 135 - 145 mmol/L    Magnesium     Status: None   Collection Time: 02/21/18  5:02 PM  Result Value Ref Range   Magnesium 1.8 1.7 - 2.4 mg/dL  Phosphorus     Status: None   Collection Time: 02/21/18  5:02 PM  Result Value Ref Range   Phosphorus 2.9 2.5 - 4.6 mg/dL  Glucose, capillary     Status: None   Collection Time: 02/21/18  5:18 PM  Result Value Ref Range   Glucose-Capillary 82 70 - 99 mg/dL  Glucose, capillary     Status: Abnormal   Collection Time: 02/21/18  8:02 PM  Result Value Ref Range   Glucose-Capillary 107 (H) 70 - 99 mg/dL  Sodium     Status: Abnormal   Collection Time: 02/21/18 11:15 PM  Result Value Ref Range   Sodium 156 (H) 135 - 145 mmol/L  Glucose, capillary     Status: Abnormal   Collection Time: 02/21/18 11:38 PM  Result Value Ref Range   Glucose-Capillary 146 (H) 70 - 99 mg/dL  Glucose, capillary     Status: Abnormal   Collection Time: 02/22/18  3:51 AM  Result Value Ref Range   Glucose-Capillary 131 (H) 70 - 99 mg/dL  Basic metabolic panel     Status: Abnormal   Collection Time: 02/22/18  5:16 AM  Result Value Ref Range   Sodium 157 (H) 135 - 145 mmol/L   Potassium 3.2 (L) 3.5 - 5.1 mmol/L   Chloride 121 (H) 98 - 111 mmol/L   CO2 27 22 - 32 mmol/L   Glucose, Bld 127 (H) 70 - 99 mg/dL   BUN 5 (L) 6 - 20 mg/dL   Creatinine, Ser 0.65 0.61 - 1.24 mg/dL   Calcium 8.5 (L) 8.9 - 10.3 mg/dL   GFR calc non Af Amer >60 >60 mL/min   GFR calc Af Amer >60 >60 mL/min   Anion gap 9 5 - 15  CBC with Differential/Platelet     Status: Abnormal   Collection Time: 02/22/18  5:16 AM  Result Value Ref Range   WBC 18.3 (H) 4.0 - 10.5 K/uL   RBC 3.75 (L) 4.22 - 5.81 MIL/uL   Hemoglobin 14.5 13.0 - 17.0 g/dL   HCT 42.0 39.0 - 52.0 %   MCV 112.0 (H) 78.0 - 100.0 fL   MCH 38.7 (H) 26.0 - 34.0 pg   MCHC 34.5 30.0 - 36.0 g/dL   RDW 13.7 11.5 - 15.5 %   Platelets 197 150 - 400 K/uL   Neutrophils Relative % 83 %   Lymphocytes Relative 8 %    Monocytes Relative 9 %   Eosinophils Relative 0 %   Basophils Relative 0 %   Neutro Abs 15.2 (H) 1.7 - 7.7 K/uL   Lymphs Abs 1.5 0.7 - 4.0 K/uL   Monocytes Absolute 1.6 (H) 0.1 - 1.0 K/uL   Eosinophils Absolute 0.0 0.0 - 0.7 K/uL   Basophils Absolute 0.0 0.0 - 0.1 K/uL   Smear Review MORPHOLOGY UNREMARKABLE   Magnesium     Status: None   Collection Time: 02/22/18  5:16 AM  Result Value Ref Range   Magnesium 2.0 1.7 - 2.4 mg/dL  Glucose, capillary  Status: Abnormal   Collection Time: 02/22/18  8:13 AM  Result Value Ref Range   Glucose-Capillary 132 (H) 70 - 99 mg/dL   Comment 1 Notify RN    Comment 2 Document in Chart   Sodium     Status: Abnormal   Collection Time: 02/22/18 10:43 AM  Result Value Ref Range   Sodium 157 (H) 135 - 145 mmol/L  Glucose, capillary     Status: Abnormal   Collection Time: 02/22/18 11:31 AM  Result Value Ref Range   Glucose-Capillary 120 (H) 70 - 99 mg/dL      ImagingResults(Last48hours)  Ct Head Wo Contrast  Result Date: 02/21/2018 CLINICAL DATA:  Stroke follow-up EXAM: CT HEAD WITHOUT CONTRAST TECHNIQUE: Contiguous axial images were obtained from the base of the skull through the vertex without intravenous contrast. COMPARISON:  Head CT 02/19/2018 FINDINGS: Brain: There is increased hypodensity throughout the left MCA distribution in the location of the known infarct. Hyperdense material within the infarcted region has also decreased in density, with no increased volume. Mass effect on the left lateral ventricle is increased and there is now rightward midline shift measuring 4 mm at the level of the foramina of [place]. No hydrocephalus. Right hemisphere and cerebellum are normal. Vascular: Hyperattenuation of the left middle cerebral artery. Skull: The visualized skull base, calvarium and extracranial soft tissues are normal. Sinuses/Orbits: No fluid levels or advanced mucosal thickening of the visualized paranasal sinuses. No  mastoid or middle ear effusion. The orbits are normal. IMPRESSION: 1. Progression of cytotoxic edema throughout the left MCA distribution, causing slight mass effect on the left lateral ventricle with 4 mm of rightward midline shift. No hydrocephalus. 2. Decreased density in unchanged distribution of hyperattenuating material in the left MCA territory, favored to indicate resolving contrast staining. Electronically Signed   By: Ulyses Jarred M.D.   On: 02/21/2018 05:15   Dg Chest Port 1 View  Result Date: 02/21/2018 CLINICAL DATA:  Hypoxia EXAM: PORTABLE CHEST 1 VIEW COMPARISON:  February 20, 2018 FINDINGS: Endotracheal tube tip is 4.0 cm above the carina. Nasogastric tube tip and side port are below the diaphragm. Central catheter tip is at the cavoatrial junction. No pneumothorax. There is atelectatic change in the lung bases. Lungs elsewhere clear. Heart is upper normal in size with pulmonary vascularity normal. No adenopathy. No bone lesions. IMPRESSION: Tube and catheter positions as described without pneumothorax. Lower lobe atelectatic change. No edema or consolidation. Stable cardiac silhouette. Electronically Signed   By: Lowella Grip III M.D.   On: 02/21/2018 07:25   Dg Chest Port 1 View  Result Date: 02/20/2018 CLINICAL DATA:  Respiratory failure,central line placement EXAM: PORTABLE CHEST 1 VIEW COMPARISON:  02/20/2018 FINDINGS: Endotracheal tube is in place, tip approximately 7.1 centimeters above the carina. Nasogastric tube is in place, tip beyond the level of the mid stomach. A LEFT IJ central line tip overlies the superior vena cava and is new since the previous exam. There is no pneumothorax. Heart size is normal. Mild subsegmental atelectasis in the MEDIAL LEFT lung base. Remote RIGHT rib fracture. IMPRESSION: Interval placement of LEFT IJ central line.  No pneumothorax. LEFT LOWER lobe atelectasis. Electronically Signed   By: Nolon Nations M.D.   On: 02/20/2018 16:50       Assessment/Plan: Diagnosis: Left MCA infarct with Aphasia, Left flaccid hemiplegia and dysphagia 1. Does the need for close, 24 hr/day medical supervision in concert with the patient's rehab needs make it unreasonable for this patient to be  served in a less intensive setting? Yes 2. Co-Morbidities requiring supervision/potential complications: S/p crani with cerebral edema, leukocytosis 3. Due to bladder management, bowel management, safety, skin/wound care, disease management, medication administration, pain management and patient education, does the patient require 24 hr/day rehab nursing? Yes 4. Does the patient require coordinated care of a physician, rehab nurse, PT (1-2 hrs/day, 5 days/week), OT (1-2 hrs/day, 5 days/week) and SLP (.5-1 hrs/day, 5 days/week) to address physical and functional deficits in the context of the above medical diagnosis(es)? Yes Addressing deficits in the following areas: balance, endurance, locomotion, strength, transferring, bowel/bladder control, bathing, dressing, feeding, grooming, toileting, cognition, speech, language, swallowing and psychosocial support 5. Can the patient actively participate in an intensive therapy program of at least 3 hrs of therapy per day at least 5 days per week? No 6. The potential for patient to make measurable gains while on inpatient rehab is good 7. Anticipated functional outcomes upon discharge from inpatient rehab are min assist  with PT, min assist with OT, min assist with SLP. 8. Estimated rehab length of stay to reach the above functional goals is: 20-25 d 9. Anticipated D/C setting: Home 10. Anticipated post D/C treatments: [place] therapy 11. Overall Rehab/Functional Prognosis: good  RECOMMENDATIONS: This patient's condition is appropriate for continued rehabilitative care in the following setting: CIR once able to tolerate 3hr per day Patient has agreed to participate in recommended program. N/A Note that insurance  prior authorization may be required for reimbursement for recommended care.  Comment: need to monitor increasing edema in neuro setting for now  "I have personally performed a face to face diagnostic evaluation of this patient.  Additionally, I have reviewed and concur with the physician assistant's documentation above." Charlett Blake M.D. [facility] FAAPM&R (Sports Med, Neuromuscular Med) Diplomate Am Board of [place], PA-C 02/22/2018          Revision History

## 2018-03-01 NOTE — Progress Notes (Signed)
[facility] for Infectious Disease   Reason for visit: Follow up on possible endocarditis  Interval History: TEE negative for a vegetation, no new positive cultures; WBC 17, afebrile; no associated diarrhea   Physical Exam: Constitutional:  Vitals:   03/01/18 0854 03/01/18 0949  BP: 119/82 130/86  Pulse: 79 68  Resp: 12 16  Temp:  97.9 F (36.6 C)  SpO2: 98% 100%   patient appears in NAD Eyes: anicteric Respiratory: Normal respiratory effort; CTA B Cardiovascular: RRR Skin: no rashes Neuro: aphasic  Review of Systems: Gastrointestinal: negative for diarrhea Integument/breast: negative for rash  Lab Results  Component Value Date   WBC 17.0 (H) 03/01/2018   HGB 16.0 03/01/2018   HCT 45.6 03/01/2018   MCV 106.8 (H) 03/01/2018   PLT 306 03/01/2018    Lab Results  Component Value Date   CREATININE 0.54 (L) 02/28/2018   BUN 11 02/28/2018   NA 139 02/28/2018   K 4.3 02/28/2018   CL 103 02/28/2018   CO2 28 02/28/2018    Lab Results  Component Value Date   ALT 33 02/19/2018   AST 28 02/19/2018   ALKPHOS 96 02/19/2018     Microbiology: Recent Results (from the past 240 hour(s))  MRSA PCR Screening     Status: Abnormal   Collection Time: 02/19/18  4:00 PM  Result Value Ref Range Status   MRSA by PCR POSITIVE (A) NEGATIVE Final    Comment:        The GeneXpert MRSA Assay (FDA approved for NASAL specimens only), is one component of a comprehensive MRSA colonization surveillance program. It is not intended to diagnose MRSA infection nor to guide or monitor treatment for MRSA infections. RESULT CALLED TO, READ BACK BY AND VERIFIED WITH: S.MAYES RN AT 1752 02/19/18 BY A.DAVIS Performed at [facility], [address] [place], [place] [address]   Culture, blood (Routine X 2) w Reflex to ID Panel     Status: None   Collection Time: 02/20/18  4:56 PM  Result Value Ref Range Status   Specimen Description BLOOD CENTRAL LINE  Final   Special  Requests   Final    BOTTLES DRAWN AEROBIC AND ANAEROBIC Blood Culture adequate volume   Culture   Final    NO GROWTH 5 DAYS Performed at [facility], [address] [place], [place] [address]    Report Status 02/25/2018 FINAL  Final  Culture, blood (Routine X 2) w Reflex to ID Panel     Status: None   Collection Time: 02/20/18  5:20 PM  Result Value Ref Range Status   Specimen Description BLOOD CENTRAL LINE  Final   Special Requests   Final    BOTTLES DRAWN AEROBIC AND ANAEROBIC Blood Culture adequate volume   Culture   Final    NO GROWTH 5 DAYS Performed at [facility], [place] [address] [place], [place] [address]    Report Status 02/25/2018 FINAL  Final  Culture, blood (routine x 2)     Status: None   Collection Time: 02/23/18  6:10 PM  Result Value Ref Range Status   Specimen Description BLOOD LEFT HAND  Final   Special Requests   Final    BOTTLES DRAWN AEROBIC ONLY Blood Culture adequate volume   Culture   Final    NO GROWTH 5 DAYS Performed at [facility], [place] [address] [place], [place] [address]    Report Status 02/28/2018 FINAL  Final  Culture, blood (routine x 2)     Status: Abnormal   Collection Time: 02/23/18  6:18 PM  Result Value Ref Range Status   Specimen Description BLOOD RIGHT ANTECUBITAL  Final   Special Requests   Final    BOTTLES DRAWN AEROBIC AND ANAEROBIC Blood Culture adequate volume   Culture  Setup Time   Final    GRAM POSITIVE COCCI ANAEROBIC BOTTLE ONLY CRITICAL RESULT CALLED TO, READ BACK BY AND VERIFIED WITH: J [place] 02/24/18 A BROWNING    Culture (A)  Final    STAPHYLOCOCCUS SPECIES (COAGULASE NEGATIVE) THE SIGNIFICANCE OF ISOLATING THIS ORGANISM FROM A SINGLE SET OF BLOOD CULTURES WHEN MULTIPLE SETS ARE DRAWN IS UNCERTAIN. PLEASE NOTIFY THE MICROBIOLOGY DEPARTMENT WITHIN ONE WEEK IF SPECIATION AND SENSITIVITIES ARE REQUIRED. Performed at [facility], [address], [place], [place] [address]    Report  Status 02/26/2018 FINAL  Final  Blood Culture ID Panel (Reflexed)     Status: Abnormal   Collection Time: 02/23/18  6:18 PM  Result Value Ref Range Status   Enterococcus species NOT DETECTED NOT DETECTED Final   Listeria monocytogenes NOT DETECTED NOT DETECTED Final   Staphylococcus species DETECTED (A) NOT DETECTED Final    Comment: Methicillin (oxacillin) susceptible coagulase negative staphylococcus. Possible blood culture contaminant (unless isolated from more than one blood culture draw or clinical case suggests pathogenicity). No antibiotic treatment is indicated for blood  culture contaminants. CRITICAL RESULT CALLED TO, READ BACK BY AND VERIFIED WITH: Dion Body PHARMD 1945 02/24/18 A BROWNING    Staphylococcus aureus NOT DETECTED NOT DETECTED Final   Methicillin resistance NOT DETECTED NOT DETECTED Final   Streptococcus species NOT DETECTED NOT DETECTED Final   Streptococcus agalactiae NOT DETECTED NOT DETECTED Final   Streptococcus pneumoniae NOT DETECTED NOT DETECTED Final   Streptococcus pyogenes NOT DETECTED NOT DETECTED Final   Acinetobacter baumannii NOT DETECTED NOT DETECTED Final   Enterobacteriaceae species NOT DETECTED NOT DETECTED Final   Enterobacter cloacae complex NOT DETECTED NOT DETECTED Final   Escherichia coli NOT DETECTED NOT DETECTED Final   Klebsiella oxytoca NOT DETECTED NOT DETECTED Final   Klebsiella pneumoniae NOT DETECTED NOT DETECTED Final   Proteus species NOT DETECTED NOT DETECTED Final   Serratia marcescens NOT DETECTED NOT DETECTED Final   Haemophilus influenzae NOT DETECTED NOT DETECTED Final   Neisseria meningitidis NOT DETECTED NOT DETECTED Final   Pseudomonas aeruginosa NOT DETECTED NOT DETECTED Final   Candida albicans NOT DETECTED NOT DETECTED Final   Candida glabrata NOT DETECTED NOT DETECTED Final   Candida krusei NOT DETECTED NOT DETECTED Final   Candida parapsilosis NOT DETECTED NOT DETECTED Final   Candida tropicalis NOT DETECTED NOT  DETECTED Final    Comment: Performed at [facility], [address] [place], [place] [address]    Impression/Plan:  1. Vegetation - TEE negative for vegetation so not c/w infective endocarditis.  Will stop antibiotics.  2. Stroke - aphasic.  Improving overall.    3. Leukocytosis - no evidence of infection so no indication for treatment.  Likely reactive.  No further evaluation indicated at this time.

## 2018-03-01 NOTE — Progress Notes (Signed)
Admit to unit via bed, oriented to unit, plan of care and rehab routine, medications and MD. Pt, parents state understanding of information reviewed. Hervey Ard, Dalbert Batman

## 2018-03-01 NOTE — Progress Notes (Signed)
Pt going to CIR, rehab unit [address] [place], room [id number]. Report called and given to nurse Debbie. Currently rehab PA, Pam in room with pt and family. Will transfer pt via bed when PA is done with pt and staff available to transfer.

## 2018-03-01 NOTE — Interval H&P Note (Signed)
History and Physical Interval Note:  03/01/2018 7:56 AM  Xavier Villegas  has presented today for surgery, with the diagnosis of stroke  The various methods of treatment have been discussed with the patient and family. After consideration of risks, benefits and other options for treatment, the patient has consented to  Procedure(s): TRANSESOPHAGEAL ECHOCARDIOGRAM (TEE) (N/A) as a surgical intervention .  The patient's history has been reviewed, patient examined, no change in status, stable for surgery.  I have reviewed the patient's chart and labs.  Questions were answered to the patient's satisfaction.     Dashanique Brownstein

## 2018-03-01 NOTE — Discharge Summary (Addendum)
Stroke Discharge Summary  Patient ID: Xavier Villegas   MRN: [id number]      DOB: 1980-11-16  Date of Admission: 02/19/2018 Date of Discharge: 03/01/2018  Attending Physician:  Garvin Fila, MD, Stroke MD Consultant(s):  Jennet Maduro, MD (pulmonary/intensive care), [name], MD (neursurgery), Merlyn Albert MD (cardiology), Alysia Penna, MD (Physical Medicine & Rehabtilitation) and Scharlene Gloss (ID) Patient's PCP:  Patient, No Pcp Per  Discharge Diagnoses:  Principal Problem:   Cerebral infarction due to embolism of left middle cerebral artery [facility]) s/p thrombectomy and left hemicraniectomy Active Problems:   Smoker   Folic acid deficiency   B12 deficiency   Hyperhomocysteinemia (HCC)   Right hemiparesis (HCC)   Dysphagia, post-stroke   Global aphasia   Leukocytosis   Carotid artery dissection (HCC) Left Cytotoxic edema Brain herniation Left hemicraniectomy  Past Medical History:  Diagnosis Date  . Tobacco abuse    Past Surgical History:  Procedure Laterality Date  . CRANIOTOMY Left 02/21/2018   Procedure: DECOMPRESSIVE CRANIECTOMY with bone flap to abdomen;  Surgeon: [name], MD;  Location: [place];  Service: Neurosurgery;  Laterality: Left;  DECOMPRESSIVE CRANIECTOMY with bone flap to abdomen  . IR ANGIO INTRA EXTRACRAN SEL COM CAROTID INNOMINATE UNI R MOD SED  02/19/2018  . IR ANGIO VERTEBRAL SEL VERTEBRAL BILAT MOD SED  02/19/2018  . IR CT HEAD LTD  02/19/2018  . IR PERCUTANEOUS ART THROMBECTOMY/INFUSION INTRACRANIAL INC DIAG ANGIO  02/19/2018  . IR US GUIDE VASC ACCESS RIGHT  02/19/2018  . RADIOLOGY WITH ANESTHESIA N/A 02/19/2018   Procedure: IR WITH ANESTHESIA CODE STROKE;  Surgeon: Corrie Mckusick, DO;  Location: [place];  Service: Anesthesiology;  Laterality: N/A;    Medications to be continued on Rehab Allergies as of 03/01/2018   No Known Allergies     Medication List    TAKE these medications   aspirin 325 MG tablet Take 1 tablet (325 mg total)  by mouth daily. Start taking on:  03/02/2018   cyanocobalamin 1000 MCG tablet Take 1 tablet (1,000 mcg total) by mouth daily. Start taking on:  03/02/2018   feeding supplement (ENSURE ENLIVE) Liqd Take 237 mLs by mouth 2 (two) times daily between meals.   folic acid 1 MG tablet Commonly known as:  FOLVITE Take 4 tablets (4 mg total) by mouth daily. Start taking on:  03/02/2018   pyridOXINE 50 MG tablet Commonly known as:  B-6 Take 1 tablet (50 mg total) by mouth daily. Start taking on:  03/02/2018   RESOURCE THICKENUP CLEAR Powd Take 120 g by mouth as needed (nectar thick liquids).       LABORATORY STUDIES CBC    Component Value Date/Time   WBC 17.0 (H) 03/01/2018 0623   RBC 4.27 03/01/2018 0623   HGB 16.0 03/01/2018 0623   HCT 45.6 03/01/2018 0623   PLT 306 03/01/2018 0623   MCV 106.8 (H) 03/01/2018 0623   MCH 37.5 (H) 03/01/2018 0623   MCHC 35.1 03/01/2018 0623   RDW 12.6 03/01/2018 0623   LYMPHSABS 1.8 03/01/2018 0623   MONOABS 1.4 (H) 03/01/2018 0623   EOSABS 0.4 03/01/2018 0623   BASOSABS 0.1 03/01/2018 0623   CMP    Component Value Date/Time   NA 139 02/28/2018 0500   K 4.3 02/28/2018 0500   CL 103 02/28/2018 0500   CO2 28 02/28/2018 0500   GLUCOSE 95 02/28/2018 0500   BUN 11 02/28/2018 0500   CREATININE 0.54 (L) 02/28/2018 0500  CALCIUM 8.9 02/28/2018 0500   PROT 7.2 02/19/2018 1215   ALBUMIN 3.4 (L) 02/19/2018 1215   AST 28 02/19/2018 1215   ALT 33 02/19/2018 1215   ALKPHOS 96 02/19/2018 1215   BILITOT 1.1 02/19/2018 1215   GFRNONAA >60 02/28/2018 0500   GFRAA >60 02/28/2018 0500   COAGS Lab Results  Component Value Date   INR 1.02 02/19/2018   Lipid Panel    Component Value Date/Time   CHOL 101 02/20/2018 0547   TRIG 94 02/22/2018 1745   HDL 34 (L) 02/20/2018 0547   CHOLHDL 3.0 02/20/2018 0547   VLDL 21 02/20/2018 0547   LDLCALC 46 02/20/2018 0547   HgbA1C  Lab Results  Component Value Date   HGBA1C 4.9 02/20/2018    Urinalysis No results found for: COLORURINE, APPEARANCEUR, LABSPEC, PHURINE, GLUCOSEU, HGBUR, BILIRUBINUR, KETONESUR, PROTEINUR, UROBILINOGEN, NITRITE, LEUKOCYTESUR Urine Drug Screen     Component Value Date/Time   LABOPIA NONE DETECTED 02/19/2018 1655   COCAINSCRNUR NONE DETECTED 02/19/2018 1655   LABBENZ NONE DETECTED 02/19/2018 1655   AMPHETMU NONE DETECTED 02/19/2018 1655   THCU NONE DETECTED 02/19/2018 1655   LABBARB (A) 02/19/2018 1655    Result not available. Reagent lot number recalled by manufacturer.    Alcohol Level No results found for: [facility]   SIGNIFICANT DIAGNOSTIC STUDIES Ct Head Code Stroke Wo Contrast 02/19/2018 1215 1. Hypodensities left frontal lobe and left occipital parietal lobe most compatible with acute infarct. Possible emboli or watershed infarct. Negative for hemorrhage or mass-effect  2. ASPECTS is 8   Ct Angio Head W Or Wo Contrast Ct Angio Neck W Or Wo Contrast Ct Cerebral Perfusion W Contrast 02/19/2018 1224 Large territory left MCA acute infarct. 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 Occlusion left A1 with reconstitution left A2. Occlusion left M1 and M2 segments.   IR Angiogram Dr Earleen Newport 02/19/2018 3:08 PM Left proximal cervical ICA dissection/thrombus Left M1 occlusion First pass, TICI 2a, with emboli in distal M1/M2 - Second pass, TICI 2a, with persistent occlusion of M2 branch - Third pass, TICI 2b Occlusive left ICA dissection, with patent ACA and maintained cross-flow from right to left Posterior circulation perfuses the left temporal and occipital region Flat panel CT shows SAH/ICH Complications: ICH  Ct Head Wo Contrast 02/19/2018 1526 Large territory acute infarct left MCA territory. Interval development of multiple areas of patchy hemorrhage within the infarct following clot retrieval. No midline shift.   02/19/2018 2028 Left MCA territory infarction with early low-density and swelling but no mass effect or shift at this time. I think most the hyperdensity seen previously relates to contrast staining, much of which is diminishing or resolved. There is some persistent contrast staining in the region of the insula and I could not exclude that there is a small amount of admixed hemorrhage.  02/21/2018 0515 1. Progression of cytotoxic edema throughout the left MCA distribution, causing slight mass effect on the left lateral ventricle with 4 mm of rightward midline shift. No hydrocephalus. 2. Decreased density in unchanged distribution of hyperattenuating material in the left MCA territory, favored to indicate resolving contrast staining.  02/23/2018 0605 1. Interval postsurgical changes related to left hemi craniectomy. 2. Increased edema and mass effect associated with the left MCA infarct with mild herniation of the left hemisphere through the craniectomy, further effacement of left lateral ventricle, and 5 mm left-to-right midline shift. 3. Slight interval  increased dilatation of right lateral ventricle may represent entrapment, attention at follow-up recommended.  02/24/2018 0517 1. Stable left MCA distribution infarction, mass effect, herniation through the left hemi craniectomy, effacement of left lateral ventricle, and 5 mm left-to-right midline shift. Stable slight dilatation of right lateral ventricle. 2. No new acute intracranial abnormality identified.  02/26/2018 0358 1. Stable left MCA distribution infarction, mass effect, herniation through the left hemi craniectomy, effacement of left lateral ventricle, and 5 mm left-to-right midline shift. Stable slight dilatation of right lateral ventricle. 2. No new acute intracranial abnormality identified.  MRI / MRA 02/20/2018 8119 Acute infarct of the entire left MCA territory with mild associated hemorrhage in the left parietal and frontal lobe. Diffuse  edema. 2 mm midline shift to the right. No other infarct Occlusion of the left internal carotid artery and entire left middle cerebral artery  Transthoracic Echocardiogram - Left ventricle: The cavity size was normal. Wall thickness wasincreased in a pattern of mild LVH. Systolic function was normal.The estimated ejection fraction was in the range of 55% to 60%.Wall motion was normal; there were no regional wall motionabnormalities. Left ventricular diastolic function parameterswere normal. - Aortic valve: There was a small, mobile vegetation on the leftventricular aspect. Impressions:   There is a small - medium sized mobile mass on the LV aspect of the aortic valve.The appearence is c/w vegetation. Aortic valve endocarditis  VAS [place] LOWER EXTREMITY VENOUS (DVT) There is no DVT or SVT noted in the bilateral lower extremities.  CUS  R: no ICA stenosis. The left ICA appears occluded. Right vertebral artery flow is antegrade. Left vertebral artery not insonated secondary to patient's positioning on vent.  Dg Chest Port 1 View 02/23/2018 Continued right lower lung atelectasis.   TEE 03/01/2018  Normal TEE. No vegetation or mass on the aortic valve. No aortic dissection.     HISTORY OF PRESENT ILLNESS Xavier Villegas is a 37 yo M with with no PMH who presents with right sided weakness and aphasia present on awakening. There was some question per EMS of some hand symptoms for the past week, but this is unclear as no family present at this time were aware of it. He was still normal at bedtime, however, then was found on the floor this morning 02/19/2018 by his girlfriend. EMS activated a code stroke and he was treated as unclear LKW (02/18/2018 prior to bed). His CT had aspect score of 8, but perfusion was suggestive of large area of infarct. Due to this discrepancy, he was taken to MRI for consideration of IR if his infarct burden appeared less than estimated by CTP.  Unfortunately, due  to other patient care issues (emergency in a PICU patient), it would have been a significant delay to get an MRI. After discussion amongst the team, it was felt that given the patient's young age and relatively preserved ASPECT score, he was taken for intervention. Modified Rankin Scale: 0. he was not given tPA as he was out of the window. He was taken to neuro intervention.    HOSPITAL COURSE [name] is a 37 y.o. male with no significant past medical history other than tobacco use presenting with right-sided weakness and aphasia. He did not receive IV t-PA due to late presentation.  Attempted thrombectomy with subsequent left ICA dissection and possible ICH.  Stroke: large left MCA territory infarct due to left ICA dissection s/p TICI2b revascularization of L MCA M1/2 followed by decompressive craniectomy   Resultant  right hemiplegia,  aphasia  CT head - Large territory acute infarct left MCA territory.  IR - occlusive left ICA but TICI2b left MCA  MRI head - large left MCA infarct with small ICH/SAH hemorrhagic conversion  MRA head - left ICA and MCA remain occluded  CTA H&N - Acute dissection left internal carotid artery with intraluminal thrombus  LE Venous Dopplers - no DVT  Carotid Doppler - left ICA occluded  2D Echo - EF 55-60% and aortic valve mass vs. vegetation  TEE normal. No vegetation seen.   Hypercoagulable work up negative except high homocysteine at 181.7.   LDL - 46  HgbA1c - 4.9  No antithrombotic prior to admission, now on ASA 325 mg daily. Continue at d/c  Therapy recommendations:  CIR  Disposition:  CIR  Acute respiratory insufficiency requiring mechanical ventilation for airway protection, resolved  Secondary to stroke  Intubated, now extubated  CCM managed  Cerebral edema with induced Hypernatremia  MRA large left MCA infarct with 52mm midline shift 02/20/18  Repeat CT showed mass effect with 66mm midline shift  02/21/18  CT repeat showed increased mass effect to 37mm midline shift 02/23/18  CT repeat stable mass effect with midline shift at 90mm 02/24/18  Treated with 3% saline in ICU with Na goal 150-160  CT head with improvement  3% off - Na now normalized  Aortic valve mass vs. Vegetation - refuted  TTE concerning for AV vegetation vs. mass  First 2/2 blood culture negative  Repeat blood culture - 1/2 Co neg Staph - likely contamination  WBC as high as 19.5-> now 17.0  afebrile now  Cardiology and ID consulted   TEE normal, no endocarditis.  Treated with rocephin. Stopped with neg TEE.   recommend repeat TTE in 1 year.  Leukocytosis  Felt by ID to be reactive  WBC as high as 19.5-> now 17.0  Severe hyperhomocysteinemia - associated with low FA, B12 and smoking  Homocysteine 181.7  Low FA level - 1.5 - on FA supplement  Low normal B12 level - 224 -supplementation ordered  Smoker - will provide education  On supplement of FA and B12 and B6  B6 level 3.0 (low)  MRHFR mutation normal  Left ICA dissection ??  Girlfriend stated that he bumped his head to window 3 weeks ago  Left ICA occlusion   Interventional Neuroradiology feels consistent with dissection  Tobacco abuse  Current smoker  Smoking cessation counseling will be provided   Other Stroke Risk Factors  ETOH use, advised to drink no more than 2 alcoholic beverages per day.  Family hx of stroke (grandparents)   DISCHARGE EXAM per Dr. Leonie Man Blood pressure 125/75, pulse 79, temperature 97.7 F (36.5 C), temperature source Oral, resp. rate 16, height 6' (1.829 m), weight 82.6 kg (182 lb), SpO2 98 %. General - Well nourished, well developed, lethargic. Ophthalmologic - fundi not visualized due to noncooperation and right eyelid swollen. Cardiovascular - Regular rate and rhythm. Neuro - sitting up in bed. Both eyes open with left eye slightly smaller due to soft tissue edema. PERRL. Left gaze  preference, barely cross midline.severe expressive aphasia and does not speak any words. Able to follow all central commands and some peripheral command on the left hand. Not blinking to visual threat on the right. Right facial droop. LUE and LLE spontaneous movement against gravity. RUE 1/5 with increased tone and RLE 2/5 on pain stimulation. DTR 1+ and right babinski positive. Sensation, coordination and gait not tested.  Discharge Diet  D1 nectar thick liquids  DISCHARGE PLAN  Disposition:  Transfer to [place] for ongoing PT, OT and ST  aspirin 325 mg daily for secondary stroke prevention.  Follow up echo in 1 year  Recommend ongoing risk factor control by Primary Care Physician at time of discharge from inpatient rehabilitation.  Follow-up Patient, No Pcp Per in 2 weeks following discharge from rehab.  Follow-up in [facility] in 4 weeks following discharge from rehab, office to schedule an appointment.   60 minutes were spent preparing discharge.  Burnetta Sabin, MSN, APRN, ANVP-BC, AGPCNP-BC Advanced Practice Stroke Nurse [place] for Schedule & Pager information 03/01/2018 4:56 PM    I have personally examined this patient, reviewed notes, independently viewed imaging studies, participated in medical decision making and plan of care.ROS completed by me personally and pertinent positives fully documented  I have made any additions or clarifications directly to the above note. Agree with note above.    Antony Contras, MD Medical Director [place] Pager: [phone] 03/01/2018 5:21 PM

## 2018-03-01 NOTE — Progress Notes (Signed)
Xavier Gong, RN  Rehab Admission Coordinator  Physical Medicine and Rehabilitation  PMR Pre-admission  Signed  Date of Service:  03/01/2018 2:35 PM       Related encounter: ED to Hosp-Admission (Current) from 02/19/2018 in [place] [facility]      Signed           Show:Clear all [x] Manual[x] Template[x] Copied  Added by: [x] Xavier Gong, RN   [] Hover for details   PMR Admission Coordinator Pre-Admission Assessment  Patient: Xavier Villegas is an 37 y.o., male MRN: [id number] DOB: 21-Feb-1981 Height: 6' (182.9 cm) Weight: 82.6 kg (182 lb)                                                                                                                                                  Insurance Information HMO:     PPO: yes     PCP:      IPA:      80/20:      OTHER:  PRIMARY: [employer] [place]      Policy#: [id number]      Subscriber: pt CM Name: Xavier Villegas      Phone#: [phone]     Fax#: [phone] Pre-Cert#: [id number] updates 8/5      Employer:  Benefits:  Phone #: [phone]     Name:  Eff. Date: 12/08/2017     Deduct: $3250      Out of Pocket Max: [phone]      Life Max: none CIR: 60%      SNF: 60% 60 days Outpatient: $40 per visit     Co-Pay: 30 visits combined PT and OT; 15 visits SLP Home Health: 60%      Co-Pay: 100 visits DME: 60%     Co-Pay: 40% Providers: in network  SECONDARY: none       Medicaid Application Date:       Case Manager:  Disability Application Date:       Case Worker:   Emergency Contact Information         Contact Information    Name Relation Home Work Mobile   Xavier, Villegas Mother   [phone]   Divante, Kotch Father   [phone]   Xavier, Villegas Brother   [phone]     Current Medical History  Patient Admitting Diagnosis: Left MCA infarct  History of Present Illness:  ANV:BTYOMAYO Xavier Villegas a 37 y.o.malewith history of tobacco use otherwise in good health who was  admitted on 02/19/18 after found down on the floor by girlfriend. He was found to have aphasia with right sided weakness and CTA/P head/neck showed large L-MCA infarct with acute dissection of L-ICA with intraluminal thrombus. He underwent cerebral angio withmechanical thrombectomy of left MCA with reperfusion and confirmation of acute left ICA dissection as source of emboli.Question stroke due to dissection due to reports  of hyperextension injury 3 weeks PTA.CT post procedure showed SAH with hemorrhagic transformation and follow up CT head done revealing progressive cytotoxic edema with slight mass effect and midline shift. On attempts at propofol wean, he was found to have difficulty opening his eyes with dilated left pupil. Dr. Saintclair Halsted consulted for input and patient underwent left decompressive hemicraniectomy with implantation of crani flap in LMQ on 7/15   2D echo showed EF 55-60% with medium size mobile mass on LV aspect of aortic valve c/w vegetation. BLE dopplers negative for DVT. Carotid dopplers showed total occlusion of L-ICA. Dr. Johnsie Cancel consulted for input and endocarditis.   TEE 03/01/18 negative. Recommend TTE echo in one year. ID recommends to stop antibiotics.    Total: 18 NIHSS  Past Medical History      Past Medical History:  Diagnosis Date  . Tobacco abuse     Family History  family history includes Stroke in his maternal grandfather and maternal grandmother.  Prior Rehab/Hospitalizations:  Has the patient had major surgery during 100 days prior to admission? No  Current Medications   Current Facility-Administered Medications:  .  0.9 %  sodium chloride infusion, , Intravenous, PRN, Villegas, [name], MD, Last Rate: 10 mL/hr at 02/27/18 0200 .  acetaminophen (TYLENOL) tablet 650 mg, 650 mg, Oral, Q4H PRN **OR** acetaminophen (TYLENOL) solution 650 mg, 650 mg, Per Tube, Q4H PRN, 650 mg at 02/27/18 5102 **OR** acetaminophen (TYLENOL) suppository 650 mg, 650 mg,  Rectal, Q4H PRN, Villegas, [name], MD .  aspirin tablet 325 mg, 325 mg, Oral, Daily, 325 mg at 03/01/18 0943 **OR** [DISCONTINUED] aspirin suppository 300 mg, 300 mg, Rectal, Daily, Xavier Kos, MD .  chlorhexidine (PERIDEX) 0.12 % solution 15 mL, 15 mL, Mouth Rinse, BID, Villegas, [name], MD, 15 mL at 03/01/18 0959 .  feeding supplement (ENSURE ENLIVE) (ENSURE ENLIVE) liquid 237 mL, 237 mL, Oral, BID BM, Villegas, [name], MD, 237 mL at 03/01/18 0953 .  folic acid (FOLVITE) tablet 4 mg, 4 mg, Oral, Daily, Villegas, [name], MD, 4 mg at 03/01/18 0943 .  iohexol (OMNIPAQUE) 300 MG/ML solution 100 mL, 100 mL, Intravenous, Once PRN, Villegas, [name], MD .  [name] mouth rinse, 15 mL, Mouth Rinse, q12n4p, Villegas, [name], MD, 15 mL at 02/28/18 1716 .  ondansetron (ZOFRAN) tablet 4 mg, 4 mg, Oral, Q4H PRN **OR** ondansetron (ZOFRAN) injection 4 mg, 4 mg, Intravenous, Q4H PRN, Villegas, [name], MD .  pantoprazole (PROTONIX) EC tablet 40 mg, 40 mg, Oral, Daily, Villegas, [name], MD, 40 mg at 03/01/18 0943 .  promethazine (PHENERGAN) tablet 12.5-25 mg, 12.5-25 mg, Oral, Q4H PRN, Villegas, [name], MD .  pyridOXINE (VITAMIN B-6) tablet 50 mg, 50 mg, Oral, Daily, Villegas, [name], MD, 50 mg at 03/01/18 0943 .  RESOURCE THICKENUP CLEAR, , Oral, PRN, Villegas, [name], MD .  vitamin B-12 (CYANOCOBALAMIN) tablet 1,000 mcg, 1,000 mcg, Oral, Daily, Villegas, [name], MD, 1,000 mcg at 03/01/18 5852  Patients Current Diet:       Diet Order           DIET - DYS 1 Room service appropriate? Yes; Fluid consistency: Nectar Thick  Diet effective now          Precautions / Restrictions Precautions Precautions: Fall Precaution Comments: bone flap in L abdomen Restrictions Weight Bearing Restrictions: No   Has the patient had 2 or more falls or a fall with injury in the past year?No  Prior Activity Level Community (5-7x/wk): independent and working Radiographer, therapeutic / Paramedic  Devices/Equipment: None Home Equipment: Walker - 2 wheels, Bedside commode, Other (comment)  Prior Device Use: Indicate devices/aids used by the patient prior to current illness, exacerbation or injury? None of the above  Prior Functional Level Prior Function Level of Independence: Independent Comments: working full time as Nature conservation officer Care: Did the patient need help bathing, dressing, using the toilet or eating?  Independent  Indoor Mobility: Did the patient need assistance with walking from room to room (with or without device)? Independent  Stairs: Did the patient need assistance with internal or external stairs (with or without device)? Independent  Functional Cognition: Did the patient need help planning regular tasks such as shopping or remembering to take medications? Independent  Current Functional Level Cognition  Arousal/Alertness: Lethargic Overall Cognitive Status: Impaired/Different from baseline Difficult to assess due to: Impaired communication Current Attention Level: Sustained Orientation Level: Oriented to person Following Commands: Follows one step commands with increased time Safety/Judgement: Decreased awareness of deficits, Decreased awareness of safety General Comments: pt opening eyes to name call. pt following commands with auditory cues only this session . pt visually attending to therapist with R eye in central vision this session. pt attempting to make sound with "what is your name" pt making a face as if frustrated by inability to answer.  Attention: Focused, Sustained Focused Attention: Appears intact Sustained Attention: Impaired Sustained Attention Impairment: Verbal basic, Functional basic    Extremity Assessment (includes Sensation/Coordination)  Upper Extremity Assessment: RUE deficits/detail RUE Deficits / Details: No active movement RUE noted RUE Coordination: decreased fine motor, decreased gross motor    Lower Extremity Assessment: Defer to PT evaluation RLE Deficits / Details: pt with clonus throughout session even with over pressure in sitting required grossly 3 min to inhibit response and calm limb, automatic response of withdrawal with hip and knee flexion to nail bed pressure but no movement on command    ADLs  Overall ADL's : Needs assistance/impaired Eating/Feeding: NPO Eating/Feeding Details (indicate cue type and reason): SLP arriving at the end of session Grooming: Moderate assistance, Sitting, Wash/dry face, Oral care Grooming Details (indicate cue type and reason): Assist for sitting balance, pt with R lateral lean. Cues for use of R hand during tasks--hand over hand assist for incorporating R hand. Pt compensating with LUE to complete tasks without physical assist Upper Body Bathing: Maximal assistance Lower Body Bathing: Total assistance Upper Body Dressing : Moderate assistance, Bed level Upper Body Dressing Details (indicate cue type and reason): to doff/don new gown. With cues, pt picking up RUE with L and threading it into clothing Lower Body Dressing: Total assistance Lower Body Dressing Details (indicate cue type and reason): don socks this session with knee flexion bil knees in response to tactile input to the bottom of foot. pt does not like tactile input to the sole of foot.  Functional mobility during ADLs: Maximal assistance, +2 for physical assistance(HHA, stand pivot) General ADL Comments: pt completed OOB the chair this session. pt did wash face on command and pay special attention to wiping around L eye without cues. pt needed cues to attempt lips with hand over hand (A) to help with dryness. pt attempting a smile in response to therapist engagement in session. pt completed oral care in the chair    Mobility  Overal bed mobility: Needs Assistance Bed Mobility: Supine to Sit Rolling: Max assist Sidelying to sit: Max assist, +2 for physical assistance Supine to  sit: HOB elevated, Mod assist Sit to supine:  Mod assist General bed mobility comments: assist required to bring R LE to EOB and to elevate trunk into sitting    Transfers  Overall transfer level: Needs assistance Equipment used: 2 person hand held assist Transfer via Lift Equipment: Stedy Transfers: Sit to/from Stand Sit to Stand: Mod assist, +2 physical assistance, Min assist Stand pivot transfers: Max assist, +2 physical assistance, From elevated surface General transfer comment: pt stood initially from EOB and then X2 from [place]; assistance required to power up into standing especialy on R side     Ambulation / Gait / Stairs / Wheelchair Mobility  Ambulation/Gait General Gait Details: unable    Posture / Balance Dynamic Sitting Balance Sitting balance - Comments: min-mod assist for sitting balance with R lateral lean. With verbal and visual cues pt able to correct to midline but does not sustain Balance Overall balance assessment: Needs assistance Sitting-balance support: Single extremity supported, Feet supported Sitting balance-Leahy Scale: Poor Sitting balance - Comments: min-mod assist for sitting balance with R lateral lean. With verbal and visual cues pt able to correct to midline but does not sustain Postural control: Right lateral lean Standing balance support: Bilateral upper extremity supported Standing balance-Leahy Scale: Zero Standing balance comment: pt requires mod/max A for weight shifting in standing with assistance for R knee extension      Special needs/care consideration BiPAP/CPAP  N/a CPM n/a Continuous Drip IV IVF at 50 cc/hr Dialysis  N/a Life Vest n/a Oxygen n/a Special Bed n/a Trach Size n/a Wound Vac n/a Skin head with surgical incision with dressing; LUQ bone flap to abdomen, abrasion right hip and left leg Bowel mgmt: incontinent LBM unknown Bladder mgmt: external catheter Diabetic mgmt n/a Bone flap to LUQ abdomen contact  precautions     Previous Home Environment Living Arrangements: (lives in basement of parents home)  Lives With: Family Available Help at Discharge: Family, Available 24 hours/day(family to hire assist ) Type of Home: House Home Layout: Two level, Able to live on main level with bedroom/bathroom Home Access: Stairs to enter, Ramped entrance Entrance Stairs-Number of Steps: 4-5 steps to basement apartment, ramp on main floor Bathroom Shower/Tub: Multimedia programmer: Handicapped height Bathroom Accessibility: Yes How Accessible: Accessible via walker Home Care Services: No Additional Comments: equipment is mom's but she isn't using. pt lives in basement of parent's home  Discharge Living Setting Plans for Discharge Living Setting: Lives with (comment)(parents') Type of Home at Discharge: House Discharge Home Layout: Two level, Able to live on main level with bedroom/bathroom Discharge Home Access: Stairs to enter Entrance Stairs-Number of Steps: 4 to 5 steps to basement full apartment Discharge Bathroom Shower/Tub: Walk-in shower Discharge Bathroom Toilet: Handicapped height Discharge Bathroom Accessibility: Yes How Accessible: Accessible via walker Does the patient have any problems obtaining your medications?: No  Social/Family/Support Systems 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 I discussed 24/7 care needs at d/c and they plan to  hire caregiver during the day when they work.  Goals/Additional Needs Patient/Family Goal for Rehab: min asisst with PT, OT  and SLP Expected length of stay: ELOS 20 to 25 days Equipment Needs: BOne flap in LUQ of abdomen Pt/Family Agrees to Admission and willing to participate: Yes Program Orientation Provided & Reviewed with Pt/Caregiver Including Roles  & Responsibilities: Yes   Decrease burden of Care through IP rehab admission: n/a  Possible need for SNF placement upon discharge: not anticipated. I discussed that insurance doubtful will pay for both CIR and then SNF.  Patient Condition: This patient's medical and functional status has changed since the consult dated: 02/23/2018 in which the Rehabilitation Physician determined and documented that the patient's condition is appropriate for intensive rehabilitative care in an inpatient rehabilitation facility. See "History of Present Illness" (above) for medical update. Functional changes are: max assist. Patient's medical and functional status update has been discussed with the Rehabilitation physician and patient remains appropriate for inpatient rehabilitation. Will admit to inpatient rehab today.  Preadmission Screen Completed By:  Cleatrice Burke, 03/01/2018 2:35 PM ______________________________________________________________________   Discussed status with Dr. Posey Pronto on 03/01/2018 at  1447 and received telephone approval for admission today.  Admission Coordinator:  Cleatrice Burke, time 4497 Date 03/01/2018             Cosigned by: Jamse Arn, MD at 03/01/2018 2:50 PM  Revision History

## 2018-03-01 NOTE — Progress Notes (Signed)
Pt was found trying to get up out of bed, had is feet over the edge when RN went in due to bed alarm sounding, pt is disoriented to time and situation, will order telesitter for safety.

## 2018-03-01 NOTE — Progress Notes (Signed)
SLP Cancellation Note  Patient Details Name: Xavier Villegas MRN: [id number] DOB: 12/18/1980   Cancelled treatment:       Reason Eval/Treat Not Completed: Patient at procedure or test/unavailable(pt npo)   Macario Golds 03/01/2018, 7:29 AM

## 2018-03-01 NOTE — Care Management Note (Signed)
Case Management Note  Patient Details  Name: Xavier Villegas MRN: [id number] Date of Birth: 04-28-1981  Subjective/Objective:                    Action/Plan: Pt discharging to CIR today. CM signing off.   Expected Discharge Date:                  Expected Discharge Plan:  [place]  In-House Referral:     Discharge planning Services  CM Consult  Post Acute Care Choice:    Choice offered to:     DME Arranged:    DME Agency:     HH Arranged:    [place] Agency:     Status of Service:  Completed, signed off  If discussed at [employer] of Stay Meetings, dates discussed:    Additional Comments:  Pollie Friar, RN 03/01/2018, 1:59 PM

## 2018-03-01 NOTE — Progress Notes (Signed)
Physical Therapy Treatment Patient Details Name: Xavier Villegas MRN: [id number] DOB: November 15, 1980 Today's Date: 03/01/2018    History of Present Illness 37 yo found down by girlfriend 7/13 with aphasia and right weakness with Lt MCA infarct with left ICA dissection s/p IR with reocclusion and hemorrhagic transformation with SAH, hemicraniectomy for decompression 7/15, pt with aortic valve vegetation. Pt self-extubated 7/16. No significant PMHx    PT Comments    Patient agreeable to participate in therapy. Stedy standing frame utilized this session to work on strengthening, standing balance, and activity tolerance. Girlfriend present throughout session. Current plan remains appropriate.     Follow Up Recommendations  CIR;Supervision/Assistance - 24 hour     Equipment Recommendations  Wheelchair cushion (measurements PT);Wheelchair (measurements PT);3in1 (PT)    Recommendations for Other Services       Precautions / Restrictions Precautions Precautions: Fall Precaution Comments: bone flap in L abdomen Restrictions Weight Bearing Restrictions: No    Mobility  Bed Mobility Overal bed mobility: Needs Assistance Bed Mobility: Supine to Sit     Supine to sit: HOB elevated;Mod assist     General bed mobility comments: assist required to bring R LE to EOB and to elevate trunk into sitting  Transfers Overall transfer level: Needs assistance   Transfers: Sit to/from Stand Sit to Stand: Mod assist;+2 physical assistance;Min assist         General transfer comment: pt stood initially from EOB and then X2 from [place]; assistance required to power up into standing especialy on R side   Ambulation/Gait             General Gait Details: unable   Stairs             Wheelchair Mobility    Modified Rankin (Stroke Patients Only) Modified Rankin (Stroke Patients Only) Pre-Morbid Rankin Score: No symptoms Modified Rankin: Severe disability     Balance  Overall balance assessment: Needs assistance Sitting-balance support: Single extremity supported;Feet supported Sitting balance-Leahy Scale: Poor     Standing balance support: Bilateral upper extremity supported Standing balance-Leahy Scale: Zero Standing balance comment: pt requires mod/max A for weight shifting in standing with assistance for R knee extension                              Cognition Arousal/Alertness: Awake/alert Behavior During Therapy: Flat affect Overall Cognitive Status: Impaired/Different from baseline Area of Impairment: Following commands;Problem solving;Safety/judgement;Attention;Awareness                   Current Attention Level: Sustained   Following Commands: Follows one step commands with increased time Safety/Judgement: Decreased awareness of deficits;Decreased awareness of safety   Problem Solving: Decreased initiation;Requires verbal cues;Requires tactile cues        Exercises      General Comments General comments (skin integrity, edema, etc.): L gaze preference and decreased sensation on R side      Pertinent Vitals/Pain Pain Assessment: Faces Faces Pain Scale: Hurts little more Pain Location: R shoulder with ROM especially flexion Pain Descriptors / Indicators: Grimacing Pain Intervention(s): Limited activity within patient's tolerance;Monitored during session    Home Living                      Prior Function            PT Goals (current goals can now be found in the care plan section) Acute Rehab PT Goals Patient  Stated Goal: Unable to state PT Goal Formulation: With family Time For Goal Achievement: 03/08/18 Potential to Achieve Goals: Fair Progress towards PT goals: Progressing toward goals    Frequency    Min 4X/week      PT Plan Current plan remains appropriate    Co-evaluation              AM-PAC PT "6 Clicks" Daily Activity  Outcome Measure  Difficulty turning over in bed  (including adjusting bedclothes, sheets and blankets)?: Unable Difficulty moving from lying on back to sitting on the side of the bed? : Unable Difficulty sitting down on and standing up from a chair with arms (e.g., wheelchair, bedside commode, etc,.)?: Unable Help needed moving to and from a bed to chair (including a wheelchair)?: A Lot Help needed walking in hospital room?: Total Help needed climbing 3-5 steps with a railing? : Total 6 Click Score: 7    End of Session Equipment Utilized During Treatment: Gait belt Activity Tolerance: Patient tolerated treatment well Patient left: with call bell/phone within reach;in chair;with chair alarm set Nurse Communication: Mobility status PT Visit Diagnosis: Other abnormalities of gait and mobility (R26.89);Hemiplegia and hemiparesis;Other symptoms and signs involving the nervous system (R29.898) Hemiplegia - Right/Left: Right Hemiplegia - dominant/non-dominant: Dominant Hemiplegia - caused by: Cerebral infarction     Time: 1135-1200 PT Time Calculation (min) (ACUTE ONLY): 25 min  Charges:  $Therapeutic Activity: 23-37 mins                    G Codes:       Earney Navy, PTA Pager: [phone]     Darliss Cheney 03/01/2018, 2:05 PM

## 2018-03-01 NOTE — Plan of Care (Signed)
Pt plan of care progressing, or adequate for discharge due to fact pt not fully recovered yet, discharging to CIR, inpatient rehab unit for further care PT/OT/ST. Plan of care will continue on rehab unit.

## 2018-03-01 NOTE — PMR Pre-admission (Signed)
PMR Admission Coordinator Pre-Admission Assessment  Patient: Xavier Villegas is an 37 y.o., male MRN: [id number] DOB: [birth year] Height: 6' (182.9 cm) Weight: 82.6 kg (182 lb)              Insurance Information HMO:     PPO: yes     PCP:      IPA:      80/20:      OTHER:  PRIMARY: [employer] of       Policy#: [id number]      Subscriber: pt CM Name: [name]      Phone#: [phone]     Fax#: [phone] Pre-Cert#: [id number] updates 8/5      Employer:  Benefits:  Phone #: [phone]     Name:  Eff. Date: 12/08/2017     Deduct: $3250      Out of Pocket Max: [id number]      Life Max: none CIR: 60%      SNF: 60% 60 days Outpatient: $40 per visit     Co-Pay: 30 visits combined PT and OT; 15 visits [facility]: 60%      Co-Pay: 100 visits DME: 60%     Co-Pay: 40% Providers: in network  SECONDARY: none       Medicaid Application Date:       Case Manager:  Disability Application Date:       Case Worker:   Emergency Contact Information Contact Information    Name Relation Home Work Mobile   Nikesh, Teschner Mother   [phone]   Damaria, Stofko Father   [phone]   Loeper, brad Brother   [phone]     Current Medical History  Patient Admitting Diagnosis: Left MCA infarct  History of Present Illness:  HPI: Xavier Villegas is a 37 y.o. male with history of tobacco use otherwise in good health who was admitted on 02/19/18 after found down on the floor by girlfriend. He was found to have aphasia with right sided weakness and CTA/P head/neck showed large L-MCA infarct with acute dissection of L-ICA with intraluminal thrombus. He underwent cerebral angio with mechanical thrombectomy of left MCA with reperfusion and confirmation of acute left ICA dissection as source of emboli.  Question stroke due to dissection due to reports of hyperextension injury 3 weeks PTA. CT post procedure showed SAH with hemorrhagic transformation and follow up CT head done revealing progressive  cytotoxic edema with slight mass effect and midline shift.  On attempts at propofol wean, he was found to have difficulty opening his eyes with dilated left pupil. Dr. Saintclair Halsted consulted for input and patient underwent left decompressive hemicraniectomy with implantation of crani flap in LMQ on 7/15   2D echo showed EF 55-60% with medium size mobile mass on LV aspect of aortic valve c/w vegetation. BLE dopplers negative for DVT. Carotid dopplers showed total occlusion of L-ICA. Dr. Johnsie Cancel consulted for input and endocarditis.   TEE 03/01/18 negative. Recommend TTE echo in one year. ID recommends to stop antibiotics.    Total: 18 NIHSS    Past Medical History  Past Medical History:  Diagnosis Date  . Tobacco abuse     Family History  family history includes Stroke in his maternal grandfather and maternal grandmother.  Prior Rehab/Hospitalizations:  Has the patient had major surgery during 100 days prior to admission? No  Current Medications   Current Facility-Administered Medications:  .  0.9 %  sodium chloride infusion, , Intravenous, PRN, [name], [name], MD, Last Rate: 10 mL/hr at 02/27/18  0200 .  acetaminophen (TYLENOL) tablet 650 mg, 650 mg, Oral, Q4H PRN **OR** acetaminophen (TYLENOL) solution 650 mg, 650 mg, Per Tube, Q4H PRN, 650 mg at 02/27/18 9983 **OR** acetaminophen (TYLENOL) suppository 650 mg, 650 mg, Rectal, Q4H PRN, [name], [name], MD .  aspirin tablet 325 mg, 325 mg, Oral, Daily, 325 mg at 03/01/18 0943 **OR** [DISCONTINUED] aspirin suppository 300 mg, 300 mg, Rectal, Daily, [name], MD .  chlorhexidine (PERIDEX) 0.12 % solution 15 mL, 15 mL, Mouth Rinse, BID, [name], [name], MD, 15 mL at 03/01/18 0959 .  feeding supplement (ENSURE ENLIVE) (ENSURE ENLIVE) liquid 237 mL, 237 mL, Oral, BID BM, [name], [name], MD, 237 mL at 03/01/18 0953 .  folic acid (FOLVITE) tablet 4 mg, 4 mg, Oral, Daily, [name], [name], MD, 4 mg at 03/01/18 0943 .  iohexol (OMNIPAQUE) 300 MG/ML  solution 100 mL, 100 mL, Intravenous, Once PRN, [name], [name], MD .  [name] mouth rinse, 15 mL, Mouth Rinse, q12n4p, [name], [name], MD, 15 mL at 02/28/18 1716 .  ondansetron (ZOFRAN) tablet 4 mg, 4 mg, Oral, Q4H PRN **OR** ondansetron (ZOFRAN) injection 4 mg, 4 mg, Intravenous, Q4H PRN, [name], [name], MD .  pantoprazole (PROTONIX) EC tablet 40 mg, 40 mg, Oral, Daily, [name], [name], MD, 40 mg at 03/01/18 0943 .  promethazine (PHENERGAN) tablet 12.5-25 mg, 12.5-25 mg, Oral, Q4H PRN, [name], [name], MD .  pyridOXINE (VITAMIN B-6) tablet 50 mg, 50 mg, Oral, Daily, [name], [name], MD, 50 mg at 03/01/18 0943 .  RESOURCE THICKENUP CLEAR, , Oral, PRN, [name], [name], MD .  vitamin B-12 (CYANOCOBALAMIN) tablet 1,000 mcg, 1,000 mcg, Oral, Daily, [name], [name], MD, 1,000 mcg at 03/01/18 3825  Patients Current Diet:  Diet Order           DIET - DYS 1 Room service appropriate? Yes; Fluid consistency: Nectar Thick  Diet effective now          Precautions / Restrictions Precautions Precautions: Fall Precaution Comments: bone flap in L abdomen Restrictions Weight Bearing Restrictions: No   Has the patient had 2 or more falls or a fall with injury in the past year?No  Prior Activity Level Community (5-7x/wk): independent and working Radiographer, therapeutic / [place] Devices/Equipment: None Home Equipment: Walker - 2 wheels, Bedside commode, Other (comment)  Prior Device Use: Indicate devices/aids used by the patient prior to current illness, exacerbation or injury? None of the above  Prior Functional Level Prior Function Level of Independence: Independent Comments: working full time as Nature conservation officer Care: Did the patient need help bathing, dressing, using the toilet or eating?  Independent  Indoor Mobility: Did the patient need assistance with walking from room to room (with or without device)? Independent  Stairs: Did the patient  need assistance with internal or external stairs (with or without device)? Independent  Functional Cognition: Did the patient need help planning regular tasks such as shopping or remembering to take medications? Independent  Current Functional Level Cognition  Arousal/Alertness: Lethargic Overall Cognitive Status: Impaired/Different from baseline Difficult to assess due to: Impaired communication Current Attention Level: Sustained Orientation Level: Oriented to person Following Commands: Follows one step commands with increased time Safety/Judgement: Decreased awareness of deficits, Decreased awareness of safety General Comments: pt opening eyes to name call. pt following commands with auditory cues only this session . pt visually attending to therapist with R eye in central vision this session. pt attempting to make sound with "what is your name" pt making  a face as if frustrated by inability to answer.  Attention: Focused, Sustained Focused Attention: Appears intact Sustained Attention: Impaired Sustained Attention Impairment: Verbal basic, Functional basic    Extremity Assessment (includes Sensation/Coordination)  Upper Extremity Assessment: RUE deficits/detail RUE Deficits / Details: No active movement RUE noted RUE Coordination: decreased fine motor, decreased gross motor  Lower Extremity Assessment: Defer to PT evaluation RLE Deficits / Details: pt with clonus throughout session even with over pressure in sitting required grossly 3 min to inhibit response and calm limb, automatic response of withdrawal with hip and knee flexion to nail bed pressure but no movement on command    ADLs  Overall ADL's : Needs assistance/impaired Eating/Feeding: NPO Eating/Feeding Details (indicate cue type and reason): SLP arriving at the end of session Grooming: Moderate assistance, Sitting, Wash/dry face, Oral care Grooming Details (indicate cue type and reason): Assist for sitting balance, pt  with R lateral lean. Cues for use of R hand during tasks--hand over hand assist for incorporating R hand. Pt compensating with LUE to complete tasks without physical assist Upper Body Bathing: Maximal assistance Lower Body Bathing: Total assistance Upper Body Dressing : Moderate assistance, Bed level Upper Body Dressing Details (indicate cue type and reason): to doff/don new gown. With cues, pt picking up RUE with L and threading it into clothing Lower Body Dressing: Total assistance Lower Body Dressing Details (indicate cue type and reason): don socks this session with knee flexion bil knees in response to tactile input to the bottom of foot. pt does not like tactile input to the sole of foot.  Functional mobility during ADLs: Maximal assistance, +2 for physical assistance(HHA, stand pivot) General ADL Comments: pt completed OOB the chair this session. pt did wash face on command and pay special attention to wiping around L eye without cues. pt needed cues to attempt lips with hand over hand (A) to help with dryness. pt attempting a smile in response to therapist engagement in session. pt completed oral care in the chair    Mobility  Overal bed mobility: Needs Assistance Bed Mobility: Supine to Sit Rolling: Max assist Sidelying to sit: Max assist, +2 for physical assistance Supine to sit: HOB elevated, Mod assist Sit to supine: Mod assist General bed mobility comments: assist required to bring R LE to EOB and to elevate trunk into sitting    Transfers  Overall transfer level: Needs assistance Equipment used: 2 person hand held assist Transfer via Lift Equipment: Stedy Transfers: Sit to/from Stand Sit to Stand: Mod assist, +2 physical assistance, Min assist Stand pivot transfers: Max assist, +2 physical assistance, From elevated surface General transfer comment: pt stood initially from EOB and then X2 from [place]; assistance required to power up into standing especialy on R side      Ambulation / Gait / Stairs / Wheelchair Mobility  Ambulation/Gait General Gait Details: unable    Posture / Balance Dynamic Sitting Balance Sitting balance - Comments: min-mod assist for sitting balance with R lateral lean. With verbal and visual cues pt able to correct to midline but does not sustain Balance Overall balance assessment: Needs assistance Sitting-balance support: Single extremity supported, Feet supported Sitting balance-Leahy Scale: Poor Sitting balance - Comments: min-mod assist for sitting balance with R lateral lean. With verbal and visual cues pt able to correct to midline but does not sustain Postural control: Right lateral lean Standing balance support: Bilateral upper extremity supported Standing balance-Leahy Scale: Zero Standing balance comment: pt requires mod/max A for weight shifting  in standing with assistance for R knee extension      Special needs/care consideration BiPAP/CPAP  N/a CPM n/a Continuous Drip IV IVF at 50 cc/hr Dialysis  N/a Life Vest n/a Oxygen n/a Special Bed n/a Trach Size n/a Wound Vac n/a Skin head with surgical incision with dressing; LUQ bone flap to abdomen, abrasion right hip and left leg Bowel mgmt: incontinent LBM unknown Bladder mgmt: external catheter Diabetic mgmt n/a Bone flap to LUQ abdomen contact precautions     Previous Home Environment Living Arrangements: (lives in basement of parents home)  Lives With: Family Available Help at Discharge: Family, Available 24 hours/day(family to hire assist ) Type of Home: House Home Layout: Two level, Able to live on main level with bedroom/bathroom Home Access: Stairs to enter, Ramped entrance Entrance Stairs-Number of Steps: 4-5 steps to basement apartment, ramp on main floor Bathroom Shower/Tub: Multimedia programmer: Handicapped height Bathroom Accessibility: Yes How Accessible: Accessible via walker [place]: No Additional Comments: equipment is  mom's but she isn't using. pt lives in basement of parent's home  Discharge Living Setting Plans for Discharge Living Setting: Lives with (comment)(parents') Type of Home at Discharge: House Discharge Home Layout: Two level, Able to live on main level with bedroom/bathroom Discharge Home Access: Stairs to enter Entrance Stairs-Number of Steps: 4 to 5 steps to basement full apartment Discharge Bathroom Shower/Tub: Walk-in shower Discharge Bathroom Toilet: Handicapped height Discharge Bathroom Accessibility: Yes How Accessible: Accessible via walker Does the patient have any problems obtaining your medications?: No  Social/Family/Support Systems 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 I discussed 24/7 care needs at d/c and they plan to hire caregiver during the day when they work.  Goals/Additional Needs Patient/Family Goal for Rehab: min asisst with PT, OT and SLP Expected length of stay: [facility] 20 to 25 days Equipment Needs: BOne flap in LUQ of abdomen Pt/Family Agrees to Admission and willing to participate: Yes Program Orientation Provided & Reviewed with Pt/Caregiver Including Roles  & Responsibilities: Yes   Decrease burden of Care through IP rehab admission: n/a  Possible need for SNF placement upon discharge: not anticipated. I discussed that insurance doubtful will pay for both CIR and then SNF.  Patient Condition: This patient's medical and  functional status has changed since the consult dated: 02/23/2018 in which the Rehabilitation Physician determined and documented that the patient's condition is appropriate for intensive rehabilitative care in an inpatient rehabilitation facility. See "History of Present Illness" (above) for medical update. Functional changes are: max assist. Patient's medical and functional status update has been discussed with the Rehabilitation physician and patient remains appropriate for inpatient rehabilitation. Will admit to inpatient rehab today.  Preadmission Screen Completed By:  Cleatrice Burke, 03/01/2018 2:35 PM ______________________________________________________________________   Discussed status with Dr. Posey Pronto on 03/01/2018 at  1447 and received telephone approval for admission today.  Admission Coordinator:  Cleatrice Burke, time 0093 Date 03/01/2018

## 2018-03-01 NOTE — H&P (Signed)
Physical Medicine and Rehabilitation Admission H&P    CC: Stroke with functional deficits.    HPI: Xavier Villegas is a 37 year old male with history of tobacco use otherwise in good health, and was admitted on 02/19/2018 after found down on the floor by girlfriend.  History taken from chart review and girlfriend. He was found to have aphasia with right-sided weakness and CT head reviewed, showing left MCA infarct.  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  He underwent cerebral Angio with  mechanical thrombectomy of left MCA with reperfusion and confirmation of acute left ICA dissection as source of emboli.  Question stroke due to dissection due to reports of striking his head a couple of weeks prior to admission.  CT postprocedure showed subarachnoid hemorrhage with hemorrhagic transformation and follow-up CT head done revealing progressive cytotoxic edema with slight mass-effect and midline shift.  He was found to have dilated left pupil and Dr. Saintclair Halsted was consulted for input.  Patient underwent left decompressive hemicraniectomy with implantation of cranial flap left middle quadrant on 7/15.  2D echo done showing EF of 55 to 60% with medium size mobile mass on the aspect of the aortic valve suspicious for vegetation.  Bilateral lower extreme Dopplers were negative for deep DVT.  Carotid Doppler showed total occlusion of left ICA.  He was treated with hypertonic saline and serial CCT showed evolution of large L-MCA infarct with no change in mass effect or left to right midline shift.  Mentation is improving and he was started on dysphagia 1, nectar liquids.   Dr. Linus Salmons was consulted for input on antibiotic regimen.  Blood cultures repeated and serologies for Bartonella, coxiella and antiphospholipids ordered for work up. He had did have one positive BC for staph with low grade fevers and was started on  Vanc/rocephin. TEE done today and showed no vegetation or mass on aortic valve and no aortic dissection.  Dr. Linus Salmons recommended d/c of antibiotics due to negative blood cultures  (one out of 4 positive felt to be due to contamination) and negative TEE. Elevated white count felt to be due to likely reactive and to monitor for now.     Review of Systems  Unable to perform ROS: Patient nonverbal     Past Medical History:  Diagnosis Date  . Tobacco abuse     Past Surgical History:  Procedure Laterality Date  . CRANIOTOMY Left 02/21/2018   Procedure: DECOMPRESSIVE CRANIECTOMY with bone flap to abdomen;  Surgeon: [name], MD;  Location: [facility];  Service: Neurosurgery;  Laterality: Left;  DECOMPRESSIVE CRANIECTOMY with bone flap to abdomen  . IR ANGIO INTRA EXTRACRAN SEL COM CAROTID INNOMINATE UNI R MOD SED  02/19/2018  . IR ANGIO VERTEBRAL SEL VERTEBRAL BILAT MOD SED  02/19/2018  . IR CT HEAD LTD  02/19/2018  . IR PERCUTANEOUS ART THROMBECTOMY/INFUSION INTRACRANIAL INC DIAG ANGIO  02/19/2018  . IR US GUIDE VASC ACCESS RIGHT  02/19/2018  . RADIOLOGY WITH ANESTHESIA N/A 02/19/2018   Procedure: IR WITH ANESTHESIA CODE STROKE;  Surgeon: Corrie Mckusick, DO;  Location: [facility];  Service: Anesthesiology;  Laterality: N/A;    Family History  Problem Relation Age of Onset  . Stroke Maternal Grandmother   . Liver cancer Maternal Grandmother   . Stroke Maternal Grandfather   . Leukemia Mother        CLL/[facility]. Leretha Pol at [facility]  . Diabetes Father   . High  blood pressure Father   . Liver cancer Paternal Grandmother   . Cerebral aneurysm Paternal Grandfather     Social History:  Lives with parents. 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 Per reports that he has been smoking cigarettes.  He has been smoking about 0.50 packs per day. He has never used smokeless tobacco. He reports that he drinks about 2-3 hard lemonade on the weekends. Per reports that he does not use drugs.    Allergies: No  Known Allergies    No medications prior to admission.    Drug Regimen Review  Drug regimen was reviewed and remains appropriate with no significant issues identified  Home: Home Living Family/patient expects to be discharged to:: Private residence Living Arrangements: (lives in basement of parents home) Available Help at Discharge: Family, Available 24 hours/day(family to hire assist ) Type of Home: House Home Access: Stairs to enter, Ramped entrance [employer] of Steps: 4-5 steps to basement apartment, ramp on main floor Home Layout: Two level, Able to live on main level with bedroom/bathroom Bathroom Shower/Tub: Multimedia programmer: Handicapped height Bathroom Accessibility: Yes Home Equipment: [place] - 2 wheels, Bedside commode, Other (comment) Additional Comments: equipment is mom's but she isn't using. pt lives in basement of parent's home  Lives With: Family   Functional History: Prior Function Level of Independence: Independent Comments: working full time as Scientist, physiological Status:  Mobility: Bed Mobility Overal bed mobility: Needs Assistance Bed Mobility: Supine to Sit Rolling: Max assist Sidelying to sit: Max assist, +2 for physical assistance Supine to sit: HOB elevated, Mod assist Sit to supine: Mod assist General bed mobility comments: assist required to bring R LE to EOB and to elevate trunk into sitting Transfers Overall transfer level: Needs assistance Equipment used: 2 person hand held assist Transfer via Lift Equipment: Stedy Transfers: Sit to/from Stand Sit to Stand: Mod assist, +2 physical assistance, Min assist Stand pivot transfers: Max assist, +2 physical assistance, From elevated surface General transfer comment: pt stood initially from EOB and then X2 from [place]; assistance required to power up into standing especialy on R side  Ambulation/Gait General Gait Details: unable     ADL: ADL Overall ADL's : Needs assistance/impaired Eating/Feeding: NPO Eating/Feeding Details (indicate cue type and reason): SLP arriving at the end of session Grooming: Moderate assistance, Sitting, Wash/dry face, Oral care Grooming Details (indicate cue type and reason): Assist for sitting balance, pt with R lateral lean. Cues for use of R hand during tasks--hand over hand assist for incorporating R hand. Pt compensating with LUE to complete tasks without physical assist Upper Body Bathing: Maximal assistance Lower Body Bathing: Total assistance Upper Body Dressing : Moderate assistance, Bed level Upper Body Dressing Details (indicate cue type and reason): to doff/don new gown. With cues, pt picking up RUE with L and threading it into clothing Lower Body Dressing: Total assistance Lower Body Dressing Details (indicate cue type and reason): don socks this session with knee flexion bil knees in response to tactile input to the bottom of foot. pt does not like tactile input to the sole of foot.  Functional mobility during ADLs: Maximal assistance, +2 for physical assistance(HHA, stand pivot) General ADL Comments: pt completed OOB the chair this session. pt did wash face on command and pay special attention to wiping around L eye without cues. pt needed cues to attempt lips with hand over hand (A) to help with dryness. pt attempting a smile in response to therapist  engagement in session. pt completed oral care in the chair  Cognition: Cognition Overall Cognitive Status: Impaired/Different from baseline Arousal/Alertness: Lethargic Orientation Level: Oriented to person Attention: Focused, Sustained Focused Attention: Appears intact Sustained Attention: Impaired Sustained Attention Impairment: Verbal basic, Functional basic Cognition Arousal/Alertness: Awake/alert Behavior During Therapy: Flat affect Overall Cognitive Status: Impaired/Different from baseline Area of Impairment:  Following commands, Problem solving, Safety/judgement, Attention, Awareness Current Attention Level: Sustained Following Commands: Follows one step commands with increased time Safety/Judgement: Decreased awareness of deficits, Decreased awareness of safety Problem Solving: Decreased initiation, Requires verbal cues, Requires tactile cues General Comments: pt opening eyes to name call. pt following commands with auditory cues only this session . pt visually attending to therapist with R eye in central vision this session. pt attempting to make sound with "what is your name" pt making a face as if frustrated by inability to answer.  Difficult to assess due to: Impaired communication   Blood pressure 125/75, pulse 79, temperature 97.7 F (36.5 C), temperature source Oral, resp. rate 16, height 6' (1.829 m), weight 82.6 kg (182 lb), SpO2 98 %. Physical Exam  Nursing note and vitals reviewed. Constitutional: He appears well-developed and well-nourished.  HENT:  Left crain incision with staples in place and bulky dressing. Resolving edema with left periorbital ecchymosis.   Eyes: EOM are normal. Right eye exhibits no discharge. Left eye exhibits no discharge.  Neck: Normal range of motion. Neck supple.  Cardiovascular: Normal rate and regular rhythm.  Respiratory: Effort normal and breath sounds normal.  GI: Bowel sounds are normal.  Slightly firm  Musculoskeletal: He exhibits no edema.  No edema or tenderness in extremities  Neurological: He is alert.  Unable to phonate. Unable to nod Y/N to basic orientation questions. He was able to follow occasional motor commands with verbal/tactile cues. Able to point to parents. Motor (limited due to ability to follow commands): RUE/RLE: ?1/5 LUE/LLE:? 5/5  Skin:  See above Left periorbital echymosis  Psychiatric:  Unable to assess due to nonverbal    Results for orders placed or performed during the hospital encounter of 02/19/18 (from the past  48 hour(s))  CBC     Status: Abnormal   Collection Time: 02/28/18  5:00 AM  Result Value Ref Range   WBC 16.4 (H) 4.0 - 10.5 K/uL   RBC 3.67 (L) 4.22 - 5.81 MIL/uL   Hemoglobin 14.0 13.0 - 17.0 g/dL   HCT 39.3 39.0 - 52.0 %   MCV 107.1 (H) 78.0 - 100.0 fL   MCH 38.1 (H) 26.0 - 34.0 pg   MCHC 35.6 30.0 - 36.0 g/dL   RDW 12.4 11.5 - 15.5 %   Platelets 279 150 - 400 K/uL    Comment: Performed at [facility], [address] [place], [place] [address]  Basic metabolic panel     Status: Abnormal   Collection Time: 02/28/18  5:00 AM  Result Value Ref Range   Sodium 139 135 - 145 mmol/L   Potassium 4.3 3.5 - 5.1 mmol/L   Chloride 103 98 - 111 mmol/L    Comment: Please note change in reference range.   CO2 28 22 - 32 mmol/L   Glucose, Bld 95 70 - 99 mg/dL    Comment: Please note change in reference range.   BUN 11 6 - 20 mg/dL    Comment: Please note change in reference range.   Creatinine, Ser 0.54 (L) 0.61 - 1.24 mg/dL   Calcium 8.9 8.9 - 10.3  mg/dL   GFR calc non Af Amer >60 >60 mL/min   GFR calc Af Amer >60 >60 mL/min    Comment: (NOTE) The eGFR has been calculated using the CKD EPI equation. This calculation has not been validated in all clinical situations. eGFR's persistently <60 mL/min signify possible Chronic Kidney Disease.    Anion gap 8 5 - 15    Comment: Performed at [facility] [address] [place], [place] [address]  CBC with Differential/Platelet     Status: Abnormal   Collection Time: 03/01/18  6:23 AM  Result Value Ref Range   WBC 17.0 (H) 4.0 - 10.5 K/uL   RBC 4.27 4.22 - 5.81 MIL/uL   Hemoglobin 16.0 13.0 - 17.0 g/dL   HCT 45.6 39.0 - 52.0 %   MCV 106.8 (H) 78.0 - 100.0 fL   MCH 37.5 (H) 26.0 - 34.0 pg   MCHC 35.1 30.0 - 36.0 g/dL   RDW 12.6 11.5 - 15.5 %   Platelets 306 150 - 400 K/uL   Neutrophils Relative % 78 %   Neutro Abs 13.3 (H) 1.7 - 7.7 K/uL   Lymphocytes Relative 11 %   Lymphs Abs 1.8 0.7 - 4.0 K/uL   Monocytes Relative 8  %   Monocytes Absolute 1.4 (H) 0.1 - 1.0 K/uL   Eosinophils Relative 3 %   Eosinophils Absolute 0.4 0.0 - 0.7 K/uL   Basophils Relative 1 %   Basophils Absolute 0.1 0.0 - 0.1 K/uL   Immature Granulocytes 1 %   Abs Immature Granulocytes 0.1 0.0 - 0.1 K/uL    Comment: Performed at [facility], [place] [address] [place], [place] [address]   No results found.     Medical Problem List and Plan: 1.  Right hemiparesis, dysphagia, and aphasia secondary to large left MCA infarct with hemorrhagic transformation s/p hemicraniectomy. 2.  DVT Prophylaxis/Anticoagulation: Mechanical: Sequential compression devices, below knee Bilateral lower extremities 3. Pain Management: Tylenol as needed 4. Mood: Consult LCSW  for evaluation and support 5. Neuropsych: This patient is not fully capable of making decisions on his own behalf. 6. Skin/Wound Care: Routine pressure relief measures.  Monitor wound for healing.  Maintain adequate nutritional status. 7. Fluids/Electrolytes/Nutrition: Monitor I's/O's.  Check lites in the a.m.  Encourage nectar thick fluid intake. 8. Dysphagia: Continue dysphagia 1, nectar liquids--offer fluids frequently. Will d/c IVF and monitor hydration status.  9. Leucocytosis: ?Progressive and felt to be reactive--ID recommends d/c antibiotic and monitor for signs of infection. WBC 15 at admission-->19/12---> 17.0. Mother with history of CLL. 10. High homocysteine levels/low folate levels/boderline low B12: On high dose folic supplement, B6 and received B12 inj 7/14  . MRHFR--heterozygous /single mutation ID. Tobacco cessation encouraged.  12. GERD: Continue PPI.   Post Admission Physician Evaluation: 1. Preadmission assessment reviewed and changes made below. 2. Functional deficits secondary  to left MCA infarct with hemorrhagic transformation s/p hemicraniectomy. 3. Patient is admitted to receive collaborative, interdisciplinary care between the physiatrist, rehab nursing  staff, and therapy team. 4. Patient's level of medical complexity and substantial therapy needs in context of that medical necessity cannot be provided at a lesser intensity of care such as a SNF. 5. Patient has experienced substantial functional loss from his/her baseline which was documented above under the "Functional History" and "Functional Status" headings.  Judging by the patient's diagnosis, physical exam, and functional history, the patient has potential for functional progress which will result in measurable gains while on inpatient  rehab.  These gains will be of substantial and practical use upon discharge  in facilitating mobility and self-care at the household level. 44. Physiatrist will provide 24 hour management of medical needs as well as oversight of the therapy plan/treatment and provide guidance as appropriate regarding the interaction of the two. 7. 24 hour rehab nursing will assist with bladder management, bowel management, safety, skin/wound care, disease management, medication administration, pain management and patient education  and help integrate therapy concepts, techniques,education, etc. 8. PT will assess and treat for/with: Lower extremity strength, range of motion, stamina, balance, functional mobility, safety, adaptive techniques and equipment, wound care, coping skills, pain control, stroke education. Goals are: Min A. 9. OT will assess and treat for/with: ADL's, functional mobility, safety, upper extremity strength, adaptive techniques and equipment, wound mgt, ego support, and community reintegration.   Goals are: Min A. Therapy may not proceed with showering this patient. 10. SLP will assess and treat for/with: speech, language, swallowing, cognition.  Goals are: Min A. 11. Case Management and Social Worker will assess and treat for psychological issues and discharge planning. 12. Team conference will be held weekly to assess progress toward goals and to determine barriers  to discharge. 13. Patient will receive at least 3 hours of therapy per day at least 5 days per week. 14. ELOS: 22-26 days.  15. Prognosis:  good  I have personally performed a face to face diagnostic evaluation, including, but not limited to relevant history and physical exam findings, of this patient and developed relevant assessment and plan.  Additionally, I have reviewed and concur with the physician assistant's documentation above.  Delice Lesch, MD, ABPMR Bary Leriche, PA-C 03/01/2018

## 2018-03-01 NOTE — Progress Notes (Signed)
RN went in to draw patient's blood and noted that the central line had pulled his central line. Not bleeding at the moment, but pressure held for 15 mins. MD paged to notify.

## 2018-03-01 NOTE — Progress Notes (Signed)
  Echocardiogram Echocardiogram Transesophageal has been performed.  Xavier Villegas 03/01/2018, 8:45 AM

## 2018-03-01 NOTE — Progress Notes (Signed)
Pt ready to transfer/discharge to CIR, PA completed assessment in room, pt belongings packed by family and taken up to rehab unit. Pt transported via bed by unit staff.

## 2018-03-02 ENCOUNTER — Other Ambulatory Visit: Payer: Self-pay

## 2018-03-02 ENCOUNTER — Inpatient Hospital Stay (HOSPITAL_COMMUNITY): Payer: BLUE CROSS/BLUE SHIELD | Admitting: Physical Therapy

## 2018-03-02 ENCOUNTER — Inpatient Hospital Stay (HOSPITAL_COMMUNITY): Payer: BLUE CROSS/BLUE SHIELD | Admitting: Speech Pathology

## 2018-03-02 ENCOUNTER — Encounter (HOSPITAL_COMMUNITY): Payer: BLUE CROSS/BLUE SHIELD

## 2018-03-02 ENCOUNTER — Inpatient Hospital Stay (HOSPITAL_COMMUNITY): Payer: BLUE CROSS/BLUE SHIELD | Admitting: Occupational Therapy

## 2018-03-02 DIAGNOSIS — I69351 Hemiplegia and hemiparesis following cerebral infarction affecting right dominant side: Principal | ICD-10-CM

## 2018-03-02 DIAGNOSIS — I69390 Apraxia following cerebral infarction: Secondary | ICD-10-CM

## 2018-03-02 DIAGNOSIS — I63511 Cerebral infarction due to unspecified occlusion or stenosis of right middle cerebral artery: Secondary | ICD-10-CM

## 2018-03-02 DIAGNOSIS — R4701 Aphasia: Secondary | ICD-10-CM

## 2018-03-02 LAB — CBC WITH DIFFERENTIAL/PLATELET
ABS IMMATURE GRANULOCYTES: 0.1 10*3/uL (ref 0.0–0.1)
Basophils Absolute: 0.1 10*3/uL (ref 0.0–0.1)
Basophils Relative: 1 %
EOS ABS: 0.2 10*3/uL (ref 0.0–0.7)
Eosinophils Relative: 1 %
HEMATOCRIT: 45.8 % (ref 39.0–52.0)
HEMOGLOBIN: 15.9 g/dL (ref 13.0–17.0)
IMMATURE GRANULOCYTES: 1 %
LYMPHS ABS: 1.8 10*3/uL (ref 0.7–4.0)
LYMPHS PCT: 10 %
MCH: 37 pg — ABNORMAL HIGH (ref 26.0–34.0)
MCHC: 34.7 g/dL (ref 30.0–36.0)
MCV: 106.5 fL — AB (ref 78.0–100.0)
MONOS PCT: 8 %
Monocytes Absolute: 1.4 10*3/uL — ABNORMAL HIGH (ref 0.1–1.0)
NEUTROS ABS: 14.6 10*3/uL — AB (ref 1.7–7.7)
NEUTROS PCT: 79 %
Platelets: 345 10*3/uL (ref 150–400)
RBC: 4.3 MIL/uL (ref 4.22–5.81)
RDW: 12.5 % (ref 11.5–15.5)
WBC: 18.2 10*3/uL — AB (ref 4.0–10.5)

## 2018-03-02 LAB — COMPREHENSIVE METABOLIC PANEL
ALT: 116 U/L — ABNORMAL HIGH (ref 0–44)
AST: 57 U/L — ABNORMAL HIGH (ref 15–41)
Albumin: 3.5 g/dL (ref 3.5–5.0)
Alkaline Phosphatase: 125 U/L (ref 38–126)
Anion gap: 13 (ref 5–15)
BUN: 18 mg/dL (ref 6–20)
CO2: 25 mmol/L (ref 22–32)
Calcium: 9.6 mg/dL (ref 8.9–10.3)
Chloride: 94 mmol/L — ABNORMAL LOW (ref 98–111)
Creatinine, Ser: 0.52 mg/dL — ABNORMAL LOW (ref 0.61–1.24)
GFR calc Af Amer: >60 mL/min (ref >60)
GFR calc non Af Amer: >60 mL/min (ref >60)
Glucose, Bld: 102 mg/dL — ABNORMAL HIGH (ref 70–99)
Potassium: 4.4 mmol/L (ref 3.5–5.1)
Sodium: 132 mmol/L — ABNORMAL LOW (ref 135–145)
Total Bilirubin: 0.9 mg/dL (ref 0.3–1.2)
Total Protein: 7.2 g/dL (ref 6.5–8.1)

## 2018-03-02 MED ORDER — SENNOSIDES-DOCUSATE SODIUM 8.6-50 MG PO TABS
2.0000 | ORAL_TABLET | Freq: Every evening | ORAL | Status: DC | PRN
  Administered 2018-03-02 – 2018-03-25 (×2): 2 via ORAL
  Filled 2018-03-02 (×2): qty 2

## 2018-03-02 MED ORDER — PREMIER PROTEIN SHAKE
2.0000 [oz_av] | Freq: Three times a day (TID) | ORAL | Status: DC
  Administered 2018-03-02 – 2018-03-07 (×15): 2 [oz_av] via ORAL
  Filled 2018-03-02 (×18): qty 325.31

## 2018-03-02 MED ORDER — SENNOSIDES-DOCUSATE SODIUM 8.6-50 MG PO TABS
2.0000 | ORAL_TABLET | Freq: Every day | ORAL | Status: DC
  Administered 2018-03-02 – 2018-04-02 (×32): 2 via ORAL
  Filled 2018-03-02 (×37): qty 2

## 2018-03-02 NOTE — Evaluation (Signed)
Speech Language Pathology Assessment and Plan  Patient Details  Name: Xavier Villegas MRN: [id number] Date of Birth: 1981/04/05  SLP Diagnosis: Aphasia;Apraxia;Dysphagia;Cognitive Impairments  Rehab Potential: Excellent ELOS: 21-28 days     Today's Date: 03/02/2018 SLP Individual Time: 0905-1000 SLP Individual Time Calculation (min): 55 min   Problem List:  Patient Active Problem List   Diagnosis Date Noted  . Cerebral infarction due to embolism of left middle cerebral artery (HCC) s/p thrombectomy 03/01/2018  . Carotid artery dissection ([place]) Left 03/01/2018  . Acute ischemic right MCA stroke ([place]) 03/01/2018  . Right hemiparesis ([place])   . Dysphagia, post-stroke   . Global aphasia   . Leukocytosis   . Folic acid deficiency 30/02/6225  . B12 deficiency 02/23/2018  . Hyperhomocysteinemia ([place]) 02/23/2018  . Smoker    Past Medical History:  Past Medical History:  Diagnosis Date  . Tobacco abuse    Past Surgical History:  Past Surgical History:  Procedure Laterality Date  . CRANIOTOMY Left 02/21/2018   Procedure: DECOMPRESSIVE CRANIECTOMY with bone flap to abdomen;  Surgeon: [name], MD;  Location: [facility];  Service: Neurosurgery;  Laterality: Left;  DECOMPRESSIVE CRANIECTOMY with bone flap to abdomen  . IR ANGIO INTRA EXTRACRAN SEL COM CAROTID INNOMINATE UNI R MOD SED  02/19/2018  . IR ANGIO VERTEBRAL SEL VERTEBRAL BILAT MOD SED  02/19/2018  . IR CT HEAD LTD  02/19/2018  . IR PERCUTANEOUS ART THROMBECTOMY/INFUSION INTRACRANIAL INC DIAG ANGIO  02/19/2018  . IR US GUIDE VASC ACCESS RIGHT  02/19/2018  . RADIOLOGY WITH ANESTHESIA N/A 02/19/2018   Procedure: IR WITH ANESTHESIA CODE STROKE;  Surgeon: Corrie Mckusick, DO;  Location: [facility];  Service: Anesthesiology;  Laterality: N/A;    Assessment / Plan / Recommendation Clinical Impression   Xavier Villegas is a 37 year old male with history of tobacco use otherwise in good health, and was admitted on 02/19/2018 after  found down on the floor by girlfriend.  History taken from chart review and girlfriend. He was found to have aphasia with right-sided weakness and CT head reviewed, showing left MCA infarct.  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  He underwent cerebral Angio with  mechanical thrombectomy of left MCA with reperfusion and confirmation of acute left ICA dissection as source of emboli.  Question stroke due to dissection due to reports of striking his head a couple of weeks prior to admission.  CT postprocedure showed subarachnoid hemorrhage with hemorrhagic transformation and follow-up CT head done revealing progressive cytotoxic edema with slight mass-effect and midline shift.  He was found to have dilated left pupil and Dr. Saintclair Halsted was consulted for input.  Patient underwent left decompressive hemicraniectomy with implantation of cranial flap left middle quadrant on 7/15. 2D echo done showing EF of 55 to 60% with medium size mobile mass on the aspect of the aortic valve suspicious for vegetation.  Bilateral lower extreme Dopplers were negative for deep DVT.  Carotid Doppler showed total occlusion of left ICA.  He was treated with hypertonic saline and serial CCT showed evolution of large L-MCA infarct with no change in mass effect or left to right midline shift.  Mentation is improving and he was started on dysphagia 1, nectar liquids.  Dr. Linus Salmons was consulted for input on antibiotic regimen.  Blood cultures repeated and serologies for Bartonella, coxiella and antiphospholipids ordered for work up. He had did have one positive BC for staph with low  grade fevers and was started on Vanc/rocephin. TEE done today and showed no vegetation or mass on aortic valve and no aortic dissection.  Dr. Linus Salmons recommended d/c of antibiotics due to negative blood cultures  (one out of 4 positive felt to be due to contamination) and negative TEE.  Elevated white count felt to be due to likely reactive and to monitor for now.  Pt admitted to CIR on 03/01/2018 with SLP evaluation completed on 03/02/2018 with the following results:   Pt presents with improvements in oral phase of swallow as evidenced by more efficient manipulation and transit of ice chips, thin liquids, nectar thick liquids, and pureed boluses.  Swallow response appeared timely with thicker viscosity boluses but did appear delayed with thin liquids which resulted in immediate coughing in 1 out of 3 trials.   Pt also had prolonged mastication of solids due to right sided oral motor weakness and sensory deficits which resulted in prolonged oral transit and incomplete clearance of boluses from the oral cavity.  Pt needed mod cues for use of liquid wash to facilitate transit of solids.  For now, recommend that pt remain on his currently prescribed diet.  Pt's prognosis for diet advancement is good with ongoing ST interventions for management of safe diet progression.    Pt also presents with a severe global aphasia.  Pt can follow 1 step commands for ~50% accuracy which improves slightly with max demonstration cues.  Pt's yes/no responses for biographical and environmental questions appear to be fairly accurate.  Pt can identify objects when named from a field of two and can also match object to word from a field of two.  Pt was nonverbal during today's evaluation.  He continues to have groping behaviors consistent with apraxia but was intermittently stimulable for verbalization of vowel sounds in isolation.  Pt also had 1 instance of spontaneous verbalization in response to social exchanges as therapist was leaving the room.  Pt can return demonstration for use of call bell and he used simple gestures when answering functional questions.  Cognition was not assessed formally due to language deficits; however, pt presents with right inattention/field cut, decreased sustained attention to tasks,  decreased task sequencing and organization secondary to motor planning impairment, and decreased safety awareness.   Given the abovementioned deficits, pt would benefit from skilled ST while inpatient in order to maximize functional independence and reduce burden of care prior to discharge.  Anticipate that pt will need 24/7 supervision at discharge in addition to [facility] follow up at next level of care.         Skilled Therapeutic Interventions          Cognitive-linguistic evaluation completed with results and recommendations reviewed with family.     SLP Assessment  Patient will need skilled Speech Lanaguage Pathology Services during CIR admission    Recommendations  SLP Diet Recommendations: Dysphagia 1 (Puree);Nectar Liquid Administration via: Cup;Straw Medication Administration: Whole meds with puree Supervision: Patient able to self feed;Full supervision/cueing for compensatory strategies Compensations: Slow rate;Small sips/bites;Minimize environmental distractions Postural Changes and/or Swallow Maneuvers: Out of bed for meals;Seated upright 90 degrees;Upright 30-60 min after meal Oral Care Recommendations: Oral care BID Patient destination: Home Follow up Recommendations: Home Health SLP;Outpatient SLP;24 hour supervision/assistance Equipment Recommended: To be determined    SLP Frequency 3 to 5 out of 7 days   SLP Duration  SLP Intensity  SLP Treatment/Interventions 21-28 days   Minumum of 1-2 x/day, 30 to 90 minutes  Cognitive remediation/compensation;Cueing  hierarchy;Dysphagia/aspiration precaution training;Functional tasks;Patient/family education;Environmental controls;Internal/external aids;Multimodal communication approach;Speech/Language facilitation    Pain Pain Assessment Pain Scale: 0-10 Pain Score: 0-No pain  Prior Functioning Cognitive/Linguistic Baseline: Within functional limits Type of Home: House  Lives With: Family Available Help at Discharge:  Family;Available 24 hours/day Vocation: Full time employment  Function:  Eating Eating   Modified Consistency Diet: Yes Eating Assist Level: Help with picking up utensils;Set up assist for;Supervision or verbal cues;Helper checks for pocketed food   Eating Set Up Assist For: Opening containers       Cognition Comprehension Comprehension assist level: Understands basic 75 - 89% of the time/ requires cueing 10 - 24% of the time  Expression   Expression assist level: Expresses basic 25 - 49% of the time/requires cueing 50 - 75% of the time. Uses single words/gestures.  Social Interaction Social Interaction assist level: Interacts appropriately 50 - 74% of the time - May be physically or verbally inappropriate.  Problem Solving Problem solving assist level: Solves basic 25 - 49% of the time - needs direction more than half the time to initiate, plan or complete simple activities  Memory Memory assist level: Recognizes or recalls 25 - 49% of the time/requires cueing 50 - 75% of the time   Short Term Goals: Week 1: SLP Short Term Goal 1 (Week 1): Pt will consume dys 1 textures and nectar thick liquids with min cues for use of swallowing precautions and minimal overt s/s of aspiration.  SLP Short Term Goal 2 (Week 1): Pt will consume therapeutic trials of thin liquids with min cues for use of swallowing precautions and minimal overt s/s of aspiration.  SLP Short Term Goal 3 (Week 1): Pt will voice at the vocalic syllble level in 50% of opportunities with max assist multimodal cues.   SLP Short Term Goal 4 (Week 1): Pt will follow 1 step commands during basic, familiar tasks for >50% accuracy with mod assist multimodal cues.   SLP Short Term Goal 5 (Week 1): Pt will complete basic, familiar ADLs with mod assist for functional problem solving.   SLP Short Term Goal 6 (Week 1): Pt will locate items to the right of midline during basic, familiar tasks with min assist multimodal cues.    Refer to  Care Plan for Long Term Goals  Recommendations for other services: None   Discharge Criteria: Patient will be discharged from SLP if patient refuses treatment 3 consecutive times without medical reason, if treatment goals not met, if there is a change in medical status, if patient makes no progress towards goals or if patient is discharged from hospital.  The above assessment, treatment plan, treatment alternatives and goals were discussed and mutually agreed upon: by patient  Emilio Math 03/02/2018, 10:39 AM

## 2018-03-02 NOTE — Progress Notes (Signed)
Subjective/Complaints:  Expressive greater than receptive aphasia Review of systems cannot obtain secondary to aphasia Objective: Vital Signs: Blood pressure (!) 126/94, pulse 74, temperature 97.6 F (36.4 C), temperature source Oral, resp. rate 20, height 6' (1.829 m), weight 83 kg (182 lb 15.7 oz), SpO2 99 %. No results found. Results for orders placed or performed during the hospital encounter of 03/01/18 (from the past 72 hour(s))  Comprehensive metabolic panel     Status: Abnormal   Collection Time: 03/02/18  6:50 AM  Result Value Ref Range   Sodium 132 (L) 135 - 145 mmol/L   Potassium 4.4 3.5 - 5.1 mmol/L   Chloride 94 (L) 98 - 111 mmol/L   CO2 25 22 - 32 mmol/L   Glucose, Bld 102 (H) 70 - 99 mg/dL   BUN 18 6 - 20 mg/dL   Creatinine, Ser 0.52 (L) 0.61 - 1.24 mg/dL   Calcium 9.6 8.9 - 10.3 mg/dL   Total Protein 7.2 6.5 - 8.1 g/dL   Albumin 3.5 3.5 - 5.0 g/dL   AST 57 (H) 15 - 41 U/L   ALT 116 (H) 0 - 44 U/L   Alkaline Phosphatase 125 38 - 126 U/L   Total Bilirubin 0.9 0.3 - 1.2 mg/dL   GFR calc non Af Amer >60 >60 mL/min   GFR calc Af Amer >60 >60 mL/min    Comment: (NOTE) The eGFR has been calculated using the CKD EPI equation. This calculation has not been validated in all clinical situations. eGFR's persistently <60 mL/min signify possible Chronic Kidney Disease.    Anion gap 13 5 - 15    Comment: Performed at [facility] [address], [place], [place] [address]  CBC with Differential/Platelet     Status: Abnormal   Collection Time: 03/02/18  7:40 AM  Result Value Ref Range   WBC 18.2 (H) 4.0 - 10.5 K/uL   RBC 4.30 4.22 - 5.81 MIL/uL   Hemoglobin 15.9 13.0 - 17.0 g/dL   HCT 45.8 39.0 - 52.0 %   MCV 106.5 (H) 78.0 - 100.0 fL   MCH 37.0 (H) 26.0 - 34.0 pg   MCHC 34.7 30.0 - 36.0 g/dL   RDW 12.5 11.5 - 15.5 %   Platelets 345 150 - 400 K/uL   Neutrophils Relative % 79 %   Neutro Abs 14.6 (H) 1.7 - 7.7 K/uL   Lymphocytes Relative 10 %   Lymphs Abs  1.8 0.7 - 4.0 K/uL   Monocytes Relative 8 %   Monocytes Absolute 1.4 (H) 0.1 - 1.0 K/uL   Eosinophils Relative 1 %   Eosinophils Absolute 0.2 0.0 - 0.7 K/uL   Basophils Relative 1 %   Basophils Absolute 0.1 0.0 - 0.1 K/uL   Immature Granulocytes 1 %   Abs Immature Granulocytes 0.1 0.0 - 0.1 K/uL    Comment: Performed at [facility], [address], [place], [place] [address]     HEENT: Left craniotomy incision with occlusive dressing Cardio: RRR and No murmurs Resp: CTA B/L and Unlabored GI: BS positive and Nontender nondistended Extremity:  No Edema Skin:   Other Herniotomy incision, no swelling or erythema around the borders of dressing Neuro: Abnormal Sensory Cannot assess secondary to aphasia, Abnormal Motor 0/5 right upper limb and right lower limb and Aphasic Musc/Skel:  Other No pain with upper or lower limb range of motion General no acute distress   Assessment/Plan: 1. Functional deficits secondary to left MCA infarct with hemorrhagic  transformation status post craniectomy which require 3+ hours per day of interdisciplinary therapy in a comprehensive inpatient rehab setting. Physiatrist is providing close team supervision and 24 hour management of active medical problems listed below. Physiatrist and rehab team continue to assess barriers to discharge/monitor patient progress toward functional and medical goals. FIM:    Function- Upper Body Dressing/Undressing What is the patient wearing?: Hospital gown Assist Level: 2 helpers Function - Lower Body Dressing/Undressing What is the patient wearing?: Hospital Gown, Non-skid slipper socks Position: Bed Non-skid slipper socks- Performed by helper: Don/doff right sock, Don/doff left sock Assist for footwear: Dependant Assist for lower body dressing: 2 Helpers  Function - Toileting Toileting activity did not occur: Safety/medical concerns  Function Midwife transfer activity did not occur:  Safety/medical concerns  Function - Chair/bed transfer Chair/bed transfer activity did not occur: Safety/medical concerns  Function - Locomotion: Ambulation Ambulation activity did not occur: Safety/medical concerns  Function - Comprehension Comprehension: Auditory Comprehension assist level: Understands basic 75 - 89% of the time/ requires cueing 10 - 24% of the time  Function - Expression Expression: Verbal Expression assist level: Expresses basic 25 - 49% of the time/requires cueing 50 - 75% of the time. Uses single words/gestures.  Function - Social Interaction Social Interaction assist level: Interacts appropriately 50 - 74% of the time - May be physically or verbally inappropriate.  Function - Problem Solving Problem solving assist level: Solves basic 25 - 49% of the time - needs direction more than half the time to initiate, plan or complete simple activities  Function - Memory Memory assist level: Recognizes or recalls 25 - 49% of the time/requires cueing 50 - 75% of the time Patient normally able to recall (first 3 days only): (difficult to assess due to language impairment )   Medical Problem List and Plan: 1.  Right hemiparesis, dysphagia, and aphasia secondary to large left MCA infarct with hemorrhagic transformation s/p hemicraniectomy. Initiate rehabilitation evaluations today 2.  DVT Prophylaxis/Anticoagulation: Mechanical: Sequential compression devices, below knee Bilateral lower extremities 3. Pain Management: Tylenol as needed 4. Mood: Consult LCSW  for evaluation and support 5. Neuropsych: This patient is not fully capable of making decisions on his own behalf. 6. Skin/Wound Care: Routine pressure relief measures.  Monitor wound for healing.  Maintain adequate nutritional status. 7. Fluids/Electrolytes/Nutrition: Monitor I's/O's.   Encourage nectar thick fluid intake.  Bmet normal 03/02/2018 8. Dysphagia: Continue dysphagia 1, nectar liquids--offer fluids  frequently. Will d/c IVF and monitor hydration status.  9. Leucocytosis: ?Progressive and felt to be reactive--ID recommends d/c antibiotic and monitor for signs of infection. WBC 15 at admission-->19/12---> 17.0.  18 K today, remains afebrile mother with history of CLL. 10. High homocysteine levels/low folate levels/boderline low B12: On high dose folic supplement, B6 and received B12 inj 7/14  . MRHFR--heterozygous /single mutation ID. Tobacco cessation encouraged.  12. GERD: Continue PPI.     LOS (Days) 1 A FACE TO FACE EVALUATION WAS PERFORMED  Charlett Blake 03/02/2018, 11:37 AM

## 2018-03-02 NOTE — Progress Notes (Signed)
Orthopedic Tech Progress Note Patient Details:  Xavier Villegas 08/19/1980 [id number]  Patient ID: Xavier Villegas, male   DOB: 1981/04/19, 36 y.o.   MRN: [id number]   Xavier Villegas 03/02/2018, 11:17 AM Called in hanger brace order; spoke with [facility]

## 2018-03-02 NOTE — Evaluation (Signed)
Occupational Therapy Assessment and Plan  Patient Details  Name: Xavier Villegas MRN: [id number] Date of Birth: 12-Dec-1980  OT Diagnosis: cognitive deficits, hemiplegia affecting dominant side, muscle weakness (generalized) and coordination disorder Rehab Potential: Rehab Potential (ACUTE ONLY): Good ELOS: 24-28 days   Today's Date: 03/02/2018 OT Individual Time: 1100-1158 OT Individual Time Calculation (min): 58 min     Problem List:  Patient Active Problem List   Diagnosis Date Noted  . Cerebral infarction due to embolism of left middle cerebral artery (HCC) s/p thrombectomy 03/01/2018  . Carotid artery dissection ([place]) Left 03/01/2018  . Acute ischemic right MCA stroke ([place]) 03/01/2018  . Right hemiparesis ([place])   . Dysphagia, post-stroke   . Global aphasia   . Leukocytosis   . Folic acid deficiency 25/00/3704  . B12 deficiency 02/23/2018  . Hyperhomocysteinemia ([place]) 02/23/2018  . Smoker     Past Medical History:  Past Medical History:  Diagnosis Date  . Tobacco abuse    Past Surgical History:  Past Surgical History:  Procedure Laterality Date  . CRANIOTOMY Left 02/21/2018   Procedure: DECOMPRESSIVE CRANIECTOMY with bone flap to abdomen;  Surgeon: [name], MD;  Location: [place];  Service: Neurosurgery;  Laterality: Left;  DECOMPRESSIVE CRANIECTOMY with bone flap to abdomen  . IR ANGIO INTRA EXTRACRAN SEL COM CAROTID INNOMINATE UNI R MOD SED  02/19/2018  . IR ANGIO VERTEBRAL SEL VERTEBRAL BILAT MOD SED  02/19/2018  . IR CT HEAD LTD  02/19/2018  . IR PERCUTANEOUS ART THROMBECTOMY/INFUSION INTRACRANIAL INC DIAG ANGIO  02/19/2018  . IR US GUIDE VASC ACCESS RIGHT  02/19/2018  . RADIOLOGY WITH ANESTHESIA N/A 02/19/2018   Procedure: IR WITH ANESTHESIA CODE STROKE;  Surgeon: Corrie Mckusick, DO;  Location: [facility];  Service: Anesthesiology;  Laterality: N/A;    Assessment & Plan Clinical Impression: Patient is a 37 y.o. year old male with 37 year old male with history of  tobacco use otherwise in good health, and was admitted on 02/19/2018 after found down on the floor by girlfriend.  History taken from chart review and girlfriend. He was found to have aphasia with right-sided weakness and CT head reviewed, showing left MCA infarct.  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  He underwent cerebral Angio with  mechanical thrombectomy of left MCA with reperfusion and confirmation of acute left ICA dissection as source of emboli.  Question stroke due to dissection due to reports of striking his head a couple of weeks prior to admission.  CT postprocedure showed subarachnoid hemorrhage with hemorrhagic transformation and follow-up CT head done revealing progressive cytotoxic edema with slight mass-effect and midline shift.  He was found to have dilated left pupil and Dr. Saintclair Halsted was consulted for input.  Patient underwent left decompressive hemicraniectomy with implantation of cranial flap left middle quadrant on 7/15.  2D echo done showing EF of 55 to 60% with medium size mobile mass on the aspect of the aortic valve suspicious for vegetation.  Bilateral lower extreme Dopplers were negative for deep DVT.  Carotid Doppler showed total occlusion of left ICA.  He was treated with hypertonic saline and serial CCT showed evolution of large L-MCA infarct with no change in mass effect or left to right midline shift.  Mentation is improving and he was started on dysphagia 1, nectar liquids.   Dr. Linus Salmons was consulted for input on antibiotic regimen.  Blood cultures repeated and serologies for Bartonella, coxiella and antiphospholipids  ordered for work up. He had did have one positive BC for staph with low grade fevers and was started on Vanc/rocephin. TEE done today and showed no vegetation or mass on aortic valve and no aortic dissection.  Dr. Linus Salmons recommended d/c of antibiotics due to negative blood  cultures  (one out of 4 positive felt to be due to contamination) and negative TEE. Elevated white count felt to be due to likely reactive and to monitor for now.   Patient transferred to CIR on 03/01/2018 .    Patient currently requires max with basic self-care skills secondary to muscle weakness, decreased cardiorespiratoy endurance, decreased coordination and decreased motor planning, decreased initiation, decreased attention, decreased awareness, decreased problem solving, decreased safety awareness and decreased memory and decreased sitting balance, decreased standing balance, decreased postural control, hemiplegia and decreased balance strategies.  Prior to hospitalization, patient could complete ADLs and IADLs with independent .  Patient will benefit from skilled intervention to decrease level of assist with basic self-care skills prior to discharge home with care partner.  Anticipate patient will require 24 hour supervision and minimal physical assistance and follow up outpatient.  OT - End of Session Activity Tolerance: Decreased this session Endurance Deficit: Yes Endurance Deficit Description: multiple rest breaks and having to be put back to bed secondary to fatigue OT Assessment Rehab Potential (ACUTE ONLY): Good OT Barriers to Discharge: Medical stability;Home environment access/layout OT Patient demonstrates impairments in the following area(s): Balance;Behavior;Cognition;Vision;Endurance;Motor;Pain;Perception;Safety OT Basic ADL's Functional Problem(s): Grooming;Bathing;Dressing;Toileting OT Transfers Functional Problem(s): Toilet;Tub/Shower OT Additional Impairment(s): None OT Plan OT Intensity: Minimum of 1-2 x/day, 45 to 90 minutes OT Frequency: 5 out of 7 days OT Duration/Estimated Length of Stay: 24-28 days OT Treatment/Interventions: Balance/vestibular training;Neuromuscular re-education;Self Care/advanced ADL retraining;Therapeutic Exercise;Wheelchair  propulsion/positioning;Cognitive remediation/compensation;DME/adaptive equipment instruction;Pain management;UE/LE Strength taining/ROM;Community reintegration;Patient/family education;UE/LE Coordination activities;Discharge planning;Functional mobility training;Psychosocial support;Therapeutic Activities OT Self Feeding Anticipated Outcome(s): n/a OT Basic Self-Care Anticipated Outcome(s): S OT Toileting Anticipated Outcome(s): CGA OT Bathroom Transfers Anticipated Outcome(s): CGA OT Recommendation Recommendations for Other Services: Other (comment)(none at this time) Patient destination: Home Follow Up Recommendations: Outpatient OT;24 hour supervision/assistance Equipment Recommended: To be determined   Skilled Therapeutic Intervention Upon entering the room, pt supine in bed with family friend present in the room.OT educated pt on OT purpose, POC, and goals.  Pt only greeting therapist with "hey" this session. Pt appearing frustrated with attempts to speak. Performed bed mobility with min A for R LE. Pt standing with therapist with mod lifting assistance. 3 steps taken forwards with max A and therapist blocking R LE during weight shift and advancing R LE. Pt returning to EOB and standing into STEDY with mod A. Pt seated in STEDY at sink for bathing tasks with mod multimodal cues for midline orientation and use of mirror for visual feed back. Pt standing multiple times in STEDY for hygiene and becomes very fatigue. Pt returning to bed via STEDY for safety. Pt returning to supine and quickly asleep. Bed alarm and call bell within reach.   OT Evaluation Precautions/Restrictions  Precautions Precautions: Fall Precaution Comments: bone flap in L abdomen, no L side lying Restrictions Weight Bearing Restrictions: No General PT Missed Treatment Reason: Patient fatigue Vital Signs Therapy Vitals Temp: 97.8 F (36.6 C) Temp Source: Oral Pulse Rate: 89 Resp: 18 BP: 127/81 Patient Position  (if appropriate): Lying Oxygen Therapy SpO2: 99 % O2 Device: Room Air Pain Pain Assessment Pain Scale: Faces Faces Pain Scale: No hurt Pain Location: Head Pain Descriptors / Indicators:  Guarding Pain Intervention(s): Emotional support Home Living/Prior Functioning Home Living Family/patient expects to be discharged to:: Private residence Living Arrangements: Other (Comment), Parent(lives in parent's basement) Available Help at Discharge: Family, Available 24 hours/day Type of Home: House Home Access: Stairs to enter, Ramped entrance [employer] of Steps: 4-5 steps to basement apartment, ramp on main floor Home Layout: Two level, Able to live on main level with bedroom/bathroom Bathroom Shower/Tub: Multimedia programmer: Handicapped height Bathroom Accessibility: Yes Additional Comments: equipment is mom's but she isn't using. pt lives in basement of parent's home  Lives With: Family Prior Function Level of Independence: Independent with transfers, Independent with gait, Independent with basic ADLs, Independent with homemaking with ambulation  Able to Take Stairs?: Yes Driving: Yes Vocation: Full time employment Comments: working full time as Optician, dispensing?: Vision impaired- to be further tested in functional context Perception  Perception: Impaired Inattention/Neglect: Does not attend to right side of body;Does not attend to right visual field Praxis Praxis: Impaired Praxis Impairment Details: Motor planning Cognition Overall Cognitive Status: Impaired/Different from baseline Arousal/Alertness: Awake/alert Orientation Level: Nonverbal/unable to assess Immediate Memory Recall: (non verbal) Memory Recall: (nonverbal) Attention: Sustained Focused Attention: Appears intact Sustained Attention: Impaired Sustained Attention Impairment: Verbal basic;Functional basic Awareness: Impaired Awareness Impairment:  Emergent impairment Problem Solving: Impaired Problem Solving Impairment: Functional basic Executive Function: Sequencing;Initiating;Organizing Sequencing: Impaired Sequencing Impairment: Functional basic Organizing: Impaired Organizing Impairment: Functional basic Initiating: Impaired Initiating Impairment: Functional basic Safety/Judgment: Impaired Comments: right inattention  Sensation Sensation Light Touch: Impaired by gross assessment Coordination Gross Motor Movements are Fluid and Coordinated: No Fine Motor Movements are Fluid and Coordinated: No Finger Nose Finger Test: unable on R, intact on L Motor  Motor Motor: Motor apraxia;Abnormal postural alignment and control;Hemiplegia Motor - Skilled Clinical Observations: R hemiparesis UE>LE Mobility  Bed Mobility Bed Mobility: Supine to Sit;Sit to Supine Supine to Sit: Minimal Assistance - Patient > 75% Sit to Supine: Moderate Assistance - Patient 50-74%  Trunk/Postural Assessment  Cervical Assessment Cervical Assessment: Within Functional Limits Thoracic Assessment Thoracic Assessment: Within Functional Limits Lumbar Assessment Lumbar Assessment: Within Functional Limits Postural Control Postural Control: Deficits on evaluation Protective Responses: delayed  Balance Balance Balance Assessed: Yes Static Sitting Balance Static Sitting - Balance Support: Left upper extremity supported;Feet supported Static Sitting - Level of Assistance: 5: Stand by assistance Dynamic Sitting Balance Dynamic Sitting - Balance Support: Feet supported;During functional activity Dynamic Sitting - Level of Assistance: 3: Mod assist Static Standing Balance Static Standing - Balance Support: Left upper extremity supported Static Standing - Level of Assistance: 4: Min assist Dynamic Standing Balance Dynamic Standing - Balance Support: Left upper extremity supported;During functional activity Dynamic Standing - Level of Assistance: 2: Max  assist Extremity/Trunk Assessment RUE Assessment RUE Assessment: Exceptions to [facility] General Strength Comments: 0/5 LUE Assessment LUE Assessment: Within Functional Limits   See Function Navigator for Current Functional Status.   Refer to Care Plan for Long Term Goals  Recommendations for other services: None    Discharge Criteria: Patient will be discharged from OT if patient refuses treatment 3 consecutive times without medical reason, if treatment goals not met, if there is a change in medical status, if patient makes no progress towards goals or if patient is discharged from hospital.  The above assessment, treatment plan, treatment alternatives and goals were discussed and mutually agreed upon: by patient  [name] 03/02/2018, 5:35 PM

## 2018-03-02 NOTE — Plan of Care (Signed)
  Problem: Consults Goal: RH STROKE PATIENT EDUCATION Description See Patient Education module for education specifics  Outcome: Not Progressing   Problem: RH BOWEL ELIMINATION Goal: RH STG MANAGE BOWEL WITH ASSISTANCE Description STG Manage Bowel with mod I Assistance.  Outcome: Not Progressing Goal: RH STG MANAGE BOWEL W/MEDICATION W/ASSISTANCE Description STG Manage Bowel with Medication with mod I Assistance.  Outcome: Not Progressing Goal: RH STG MANAGE BOWEL W/EQUIPMENT W/ASSISTANCE Description STG Manage Bowel With Equipment With Assistance Outcome: Not Progressing   Problem: RH BLADDER ELIMINATION Goal: RH STG MANAGE BLADDER WITH ASSISTANCE Description STG Manage Bladder With mod I  Assistance  Outcome: Not Progressing   Problem: RH SKIN INTEGRITY Goal: RH STG ABLE TO PERFORM INCISION/WOUND CARE W/ASSISTANCE Description STG Able To Perform Incision/Wound Care With mod I  Assistance.  Outcome: Not Progressing   Problem: RH SAFETY Goal: RH STG ADHERE TO SAFETY PRECAUTIONS W/ASSISTANCE/DEVICE Description STG Adhere to Safety Precautions With mod I Assistance/Device. Telesitter use as needed  Outcome: Not Progressing Goal: RH STG DEMO UNDERSTANDING HOME SAFETY PRECAUTIONS Description With mod I assist  Outcome: Not Progressing Goal: RH OTHER STG SAFETY GOALS W/ASSIST Description Other STG Safety Goals With Assistance. Outcome: Not Progressing   Problem: RH COGNITION-NURSING Goal: RH STG USES MEMORY AIDS/STRATEGIES W/ASSIST TO PROBLEM SOLVE Description STG Uses Memory Aids/Strategies With Assistance to Problem Solve. Outcome: Not Progressing Goal: RH STG ANTICIPATES NEEDS/CALLS FOR ASSIST W/ASSIST/CUES Description STG Anticipates Needs/Calls for Assist With Assistance/Cues. Outcome: Not Progressing   Problem: RH KNOWLEDGE DEFICIT Goal: RH STG INCREASE KNOWLEDGE OF DYSPHAGIA/FLUID INTAKE Outcome: Not Progressing  Pt does not have understanding/capability  to assist in his care at present time. Pt / family states pt has not had bm since prior to admission.

## 2018-03-02 NOTE — Evaluation (Signed)
Physical Therapy Assessment and Plan  Patient Details  Name: Xavier Villegas MRN: [id number] Date of Birth: [birth year]  PT Diagnosis: Abnormality of gait, Coordination disorder, Hemiparesis dominant, Impaired cognition and Muscle weakness Rehab Potential: Good ELOS: 24-28 days   Today's Date: 03/02/2018 PT Individual Time: 1300-1400 PT Individual Time Calculation (min): 60 min    Problem List:  Patient Active Problem List   Diagnosis Date Noted  . Cerebral infarction due to embolism of left middle cerebral artery (HCC) s/p thrombectomy 03/01/2018  . Carotid artery dissection ([place]) Left 03/01/2018  . Acute ischemic right MCA stroke ([place]) 03/01/2018  . Right hemiparesis ([place])   . Dysphagia, post-stroke   . Global aphasia   . Leukocytosis   . Folic acid deficiency 28/41/3244  . B12 deficiency 02/23/2018  . Hyperhomocysteinemia ([place]) 02/23/2018  . Smoker     Past Medical History:  Past Medical History:  Diagnosis Date  . Tobacco abuse    Past Surgical History:  Past Surgical History:  Procedure Laterality Date  . CRANIOTOMY Left 02/21/2018   Procedure: DECOMPRESSIVE CRANIECTOMY with bone flap to abdomen;  Surgeon: [name], MD;  Location: [facility];  Service: Neurosurgery;  Laterality: Left;  DECOMPRESSIVE CRANIECTOMY with bone flap to abdomen  . IR ANGIO INTRA EXTRACRAN SEL COM CAROTID INNOMINATE UNI R MOD SED  02/19/2018  . IR ANGIO VERTEBRAL SEL VERTEBRAL BILAT MOD SED  02/19/2018  . IR CT HEAD LTD  02/19/2018  . IR PERCUTANEOUS ART THROMBECTOMY/INFUSION INTRACRANIAL INC DIAG ANGIO  02/19/2018  . IR US GUIDE VASC ACCESS RIGHT  02/19/2018  . RADIOLOGY WITH ANESTHESIA N/A 02/19/2018   Procedure: IR WITH ANESTHESIA CODE STROKE;  Surgeon: Corrie Mckusick, DO;  Location: [facility];  Service: Anesthesiology;  Laterality: N/A;    Assessment & Plan Clinical Impression: Xavier Villegas is a 37 y.o. male with history of tobacco use otherwise in good health who was admitted on  02/19/18 after found down on the floor by girlfriend. He was found to have aphasia with right sided weakness and CTA/P head/neck showed large L-MCA infarct with acute dissection of L-ICA with intraluminal thrombus. He underwent cerebral angio with mechanical thrombectomy of left MCA with reperfusion and confirmation of acute left ICA dissection as source of emboli.  Question stroke due to dissection due to reports of hyperextension injury 3 weeks PTA. CT post procedure showed SAH with hemorrhagic transformation and follow up CT head done revealing progressive cytotoxic edema with slight mass effect and midline shift.  On attempts at propofol wean, he was found to have difficulty opening his eyes with dilated left pupil. Dr. Saintclair Halsted consulted for input and patient underwent left decompressive hemicraniectomy with implantation of crani flap in LMQ on 7/15    2D echo showed EF 55-60% with medium size mobile mass on LV aspect of aortic valve c/w vegetation. BLE dopplers negative for DVT. Carotid dopplers showed total occlusion of L-ICA. Dr. Johnsie Cancel consulted for input and endocarditis.    TEE 03/01/18 negative. Recommend TTE echo in one year. ID recommends to stop antibiotics.   Patient transferred to CIR on 03/01/2018 .   Patient currently requires max with mobility secondary to muscle weakness, abnormal tone, motor apraxia, decreased coordination and decreased motor planning, decreased attention to right, decreased initiation, decreased attention, decreased awareness, decreased problem solving, decreased safety awareness, decreased memory and delayed processing and decreased sitting balance, decreased standing balance, decreased postural control, hemiplegia and decreased balance strategies.  Prior to hospitalization, patient was independent  with  mobility and lived with Family in a House home.  Home access is 4-5 steps to basement apartment, ramp on main floorStairs to enter, Ramped entrance(per chart  review).  Patient will benefit from skilled PT intervention to maximize safe functional mobility, minimize fall risk and decrease caregiver burden for planned discharge home with 24 hour supervision.  Anticipate patient will benefit from follow up [place] at discharge.  PT - End of Session Activity Tolerance: Tolerates 10 - 20 min activity with multiple rests Endurance Deficit: Yes PT Assessment Rehab Potential (ACUTE/IP ONLY): Good PT Barriers to Discharge: Inaccessible home environment;Decreased caregiver support PT Patient demonstrates impairments in the following area(s): Balance;Endurance;Motor;Perception;Safety PT Transfers Functional Problem(s): Bed Mobility;Bed to Chair;Car;Furniture PT Locomotion Functional Problem(s): Stairs;Wheelchair Mobility;Ambulation PT Plan PT Intensity: Minimum of 1-2 x/day ,45 to 90 minutes PT Frequency: 5 out of 7 days PT Duration Estimated Length of Stay: 24-28 days PT Treatment/Interventions: Ambulation/gait training;Balance/vestibular training;Cognitive remediation/compensation;Discharge planning;Community reintegration;DME/adaptive equipment instruction;Functional electrical stimulation;Functional mobility training;Patient/family education;Neuromuscular re-education;Psychosocial support;Splinting/orthotics;Therapeutic Exercise;Therapeutic Activities;Stair training;UE/LE Strength taining/ROM;Wheelchair propulsion/positioning;UE/LE Coordination activities;Visual/perceptual remediation/compensation PT Transfers Anticipated Outcome(s): supervision PT Locomotion Anticipated Outcome(s): min guard with LRAD ambulatory, supervision w/c for community distances PT Recommendation Follow Up Recommendations: Home health PT;24 hour supervision/assistance Patient destination: Home Equipment Recommended: To be determined  Skilled Therapeutic Intervention No c/o pain, but pt holding head throughout session and grimacing.  Session focus on initial PT evaluation, pt  education in role of rehab/goals of therapy/plan of care, and instruction in transfers, R NMR, and ambulation.    Pt requires max assist for transfers to R and L with assist for boosting, eccentric lowering, and blocking R knee.  Pt unable to activate R knee in stance during transfers.  Car transfer with max assist and multimodal cues for precautions and sequencing.  Gait training at rail in hallway x30' with max assist to facilitate RLE step through and tactile cues for R quad activation and weight shifting.  Attempted 1 step negotiation with pt, but pt unable to power up with LLE on step and unsafe to continue.  Returned to room at end of session and positioned in bed with call bell in reach and needs met.   PT Evaluation Precautions/Restrictions Precautions Precautions: Fall Precaution Comments: bone flap in L abdomen, no L side lying Restrictions Weight Bearing Restrictions: No General PT Amount of Missed Time (min): 15 Minutes PT Missed Treatment Reason: Patient fatigue  Pain Pain Assessment Pain Scale: Faces Faces Pain Scale: Hurts a little bit Pain Location: Head Pain Descriptors / Indicators: Guarding Pain Intervention(s): Emotional support Home Living/Prior Functioning Home Living Available Help at Discharge: Family;Available 24 hours/day(per chart review, family to arrange 24/7 between family and hired caregivers) Type of Home: House Home Access: Stairs to enter;Ramped entrance(per chart review) Entrance Stairs-Number of Steps: 4-5 steps to basement apartment, ramp on main floor Home Layout: Two level;Able to live on main level with bedroom/bathroom(per chart review)  Lives With: Family Prior Function Level of Independence: Independent with transfers;Independent with gait  Able to Take Stairs?: Yes Driving: Yes Vocation: Full time employment 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 Perception:  Impaired Inattention/Neglect: Does not attend to right side of body;Does not attend to right visual field Praxis Praxis: Impaired Praxis Impairment Details: Motor planning  Cognition Overall Cognitive Status: Impaired/Different from baseline Arousal/Alertness: Awake/alert Orientation Level: Oriented to person;Oriented to place;Disoriented to time;Disoriented to situation Attention: Sustained Focused Attention: Appears intact Sustained Attention: Impaired Sustained Attention Impairment: Verbal basic;Functional basic Awareness: Impaired  Awareness Impairment: Emergent impairment Problem Solving: Impaired Problem Solving Impairment: Functional basic Safety/Judgment: Impaired Comments: right inattention  Sensation Sensation Light Touch: Impaired by gross assessment(appears intact on LLE, but not able to formally assess due to cognitive deficits) Coordination Gross Motor Movements are Fluid and Coordinated: No Fine Motor Movements are Fluid and Coordinated: No Finger Nose Finger Test: unable on R, intact on L Motor  Motor Motor: Motor apraxia;Abnormal postural alignment and control;Hemiplegia Motor - Skilled Clinical Observations: R hemiparesis UE>LE  Mobility Bed Mobility Bed Mobility: Supine to Sit;Sit to Supine Supine to Sit: Minimal Assistance - Patient > 75% Sit to Supine: Moderate Assistance - Patient 50-74% Transfers Transfers: Stand Pivot Transfers Stand Pivot Transfers: Maximal Assistance - Patient 25 - 49% Stand Pivot Transfer Details: Visual cues/gestures for sequencing;Visual cues for safe use of DME/AE;Verbal cues for technique;Verbal cues for precautions/safety;Verbal cues for safe use of DME/AE;Manual facilitation for weight shifting Stand Pivot Transfer Details (indicate cue type and reason): assist for power up, controlled descent, and to block R knee Transfer (Assistive device): None Locomotion  Gait Ambulation: Yes Gait Assistance: 2 Helpers;Maximal Assistance  - Patient 25-49%(max for gait, +2 for w/c follow) Gait Distance (Feet): 30 Feet Assistive device: (rail in hallway) Gait Assistance Details: Tactile cues for weight shifting;Tactile cues for initiation;Verbal cues for technique;Verbal cues for precautions/safety;Verbal cues for gait pattern;Verbal cues for safe use of DME/AE;Manual facilitation for weight shifting Gait Assistance Details: facilitation at R quad for activation, verbal cues for sequencing and upright posture Stairs / Additional Locomotion Stairs: No(attempted, unable) Wheelchair Mobility Wheelchair Mobility: No  Trunk/Postural Assessment  Cervical Assessment Cervical Assessment: Within Functional Limits Thoracic Assessment Thoracic Assessment: Within Functional Limits Lumbar Assessment Lumbar Assessment: Within Functional Limits Postural Control Postural Control: Deficits on evaluation Protective Responses: delayed  Balance Balance Balance Assessed: Yes Static Sitting Balance Static Sitting - Balance Support: Left upper extremity supported;Feet supported Static Sitting - Level of Assistance: 5: Stand by assistance Dynamic Sitting Balance Dynamic Sitting - Balance Support: Feet supported;During functional activity Dynamic Sitting - Level of Assistance: 3: Mod assist Static Standing Balance Static Standing - Balance Support: Left upper extremity supported Static Standing - Level of Assistance: 4: Min assist Dynamic Standing Balance Dynamic Standing - Balance Support: Left upper extremity supported;During functional activity Dynamic Standing - Level of Assistance: 2: Max assist Extremity Assessment      RLE Assessment RLE Assessment: Exceptions to [facility] Passive Range of Motion (PROM) Comments: WFL Active Range of Motion (AROM) Comments: unable to assess General Strength Comments: does not move on command, however able to activate extensors in standing for SLS during gait LLE Assessment LLE Assessment: Exceptions  to [facility] Passive Range of Motion (PROM) Comments: WFL Active Range of Motion (AROM) Comments: WFL General Strength Comments: unable to formally assess 2/2 language/cognitive deficits   See Function Navigator for Current Functional Status.   Refer to Care Plan for Long Term Goals  Recommendations for other services: None   Discharge Criteria: Patient will be discharged from PT if patient refuses treatment 3 consecutive times without medical reason, if treatment goals not met, if there is a change in medical status, if patient makes no progress towards goals or if patient is discharged from hospital.  The above assessment, treatment plan, treatment alternatives and goals were discussed and mutually agreed upon: No family available/patient unable  Michel Santee 03/02/2018, 3:03 PM

## 2018-03-03 ENCOUNTER — Inpatient Hospital Stay (HOSPITAL_COMMUNITY): Payer: BLUE CROSS/BLUE SHIELD | Admitting: Occupational Therapy

## 2018-03-03 ENCOUNTER — Encounter (HOSPITAL_COMMUNITY): Payer: Self-pay | Admitting: Cardiovascular Disease

## 2018-03-03 ENCOUNTER — Inpatient Hospital Stay (HOSPITAL_COMMUNITY): Payer: BLUE CROSS/BLUE SHIELD | Admitting: Physical Therapy

## 2018-03-03 ENCOUNTER — Ambulatory Visit (HOSPITAL_COMMUNITY): Payer: BLUE CROSS/BLUE SHIELD | Admitting: Speech Pathology

## 2018-03-03 NOTE — Progress Notes (Signed)
Speech Language Pathology Daily Session Note  Patient Details  Name: Xavier Villegas MRN: [id number] Date of Birth: 1981-02-21  Today's Date: 03/03/2018 SLP Individual Time: 0730-0830 SLP Individual Time Calculation (min): 60 min  Short Term Goals: Week 1: SLP Short Term Goal 1 (Week 1): Pt will consume dys 1 textures and nectar thick liquids with min cues for use of swallowing precautions and minimal overt s/s of aspiration.  SLP Short Term Goal 2 (Week 1): Pt will consume therapeutic trials of thin liquids with min cues for use of swallowing precautions and minimal overt s/s of aspiration.  SLP Short Term Goal 3 (Week 1): Pt will voice at the vocalic syllble level in 50% of opportunities with max assist multimodal cues.   SLP Short Term Goal 4 (Week 1): Pt will follow 1 step commands during basic, familiar tasks for >50% accuracy with mod assist multimodal cues.   SLP Short Term Goal 5 (Week 1): Pt will complete basic, familiar ADLs with mod assist for functional problem solving.   SLP Short Term Goal 6 (Week 1): Pt will locate items to the right of midline during basic, familiar tasks with min assist multimodal cues.    Skilled Therapeutic Interventions: Skilled treatment session focused on dysphagia and cognition goals. SLP facilitated session by providing skilled observation of pt consuming dysphagia 1 breakfast tray with nectar thick liquids. Pt able to nonverbally communicate dislike of pureed eggs and pancakes. Tray supplemented with yogurt and graham crackers for trial of advanced textures. Pt with complete oral clearing of puree and solid graham crackers. SLP further facilitated session by providing Min A for tray set-up and Mod A for sustained/selective attention to task. As environment became more distracting with people or alarms in the hallway, pt required more cues for attention to task. Pt was able to nonverbally answer basic yes/no questions with > 90% accuracy. He produced  some minimal spontaneous voicing x1 in session. Pt was left upright in bed with nursing in room.      Function:  Eating Eating   Modified Consistency Diet: Yes Eating Assist Level: Supervision or verbal cues;Helper checks for pocketed food;Help with picking up utensils   Eating Set Up Assist For: Opening containers;Cutting food Helper [place] on Utensil: Occasionally     Cognition Comprehension Comprehension assist level: Understands basic 75 - 89% of the time/ requires cueing 10 - 24% of the time  Expression   Expression assist level: Expresses basic 25 - 49% of the time/requires cueing 50 - 75% of the time. Uses single words/gestures.  Social Interaction Social Interaction assist level: Interacts appropriately 75 - 89% of the time - Needs redirection for appropriate language or to initiate interaction.  Problem Solving Problem solving assist level: Solves basic 25 - 49% of the time - needs direction more than half the time to initiate, plan or complete simple activities  Memory Memory assist level: Recognizes or recalls 25 - 49% of the time/requires cueing 50 - 75% of the time    Pain Pain Assessment Pain Scale: 0-10 Pain Score: 2  Pain Type: Surgical pain Pain Location: Head Pain Descriptors / Indicators: Aching Pain Frequency: Intermittent Pain Onset: On-going Patients Stated Pain Goal: 4 Pain Intervention(s): Medication (See eMAR)  Therapy/Group: Individual Therapy  Katleen Carraway 03/03/2018, 10:25 AM

## 2018-03-03 NOTE — Progress Notes (Signed)
Social Work Assessment and Plan  Patient Details  Name: Xavier Villegas MRN: [id number] Date of Birth: 29-Mar-1981  Today's Date: 03/03/2018  Problem List:  Patient Active Problem List   Diagnosis Date Noted  . Cerebral infarction due to embolism of left middle cerebral artery (HCC) s/p thrombectomy 03/01/2018  . Carotid artery dissection ([place]) Left 03/01/2018  . Acute ischemic right MCA stroke ([place]) 03/01/2018  . Right hemiparesis ([place])   . Dysphagia, post-stroke   . Global aphasia   . Leukocytosis   . Folic acid deficiency 13/24/4010  . B12 deficiency 02/23/2018  . Hyperhomocysteinemia ([place]) 02/23/2018  . Smoker    Past Medical History:  Past Medical History:  Diagnosis Date  . Tobacco abuse    Past Surgical History:  Past Surgical History:  Procedure Laterality Date  . CRANIOTOMY Left 02/21/2018   Procedure: DECOMPRESSIVE CRANIECTOMY with bone flap to abdomen;  Surgeon: [name], MD;  Location: [facility];  Service: Neurosurgery;  Laterality: Left;  DECOMPRESSIVE CRANIECTOMY with bone flap to abdomen  . IR ANGIO INTRA EXTRACRAN SEL COM CAROTID INNOMINATE UNI R MOD SED  02/19/2018  . IR ANGIO VERTEBRAL SEL VERTEBRAL BILAT MOD SED  02/19/2018  . IR CT HEAD LTD  02/19/2018  . IR PERCUTANEOUS ART THROMBECTOMY/INFUSION INTRACRANIAL INC DIAG ANGIO  02/19/2018  . IR US GUIDE VASC ACCESS RIGHT  02/19/2018  . RADIOLOGY WITH ANESTHESIA N/A 02/19/2018   Procedure: IR WITH ANESTHESIA CODE STROKE;  Surgeon: Corrie Mckusick, DO;  Location: [place];  Service: Anesthesiology;  Laterality: N/A;  . TEE WITHOUT CARDIOVERSION N/A 03/01/2018   Procedure: TRANSESOPHAGEAL ECHOCARDIOGRAM (TEE);  Surgeon: Sanda Klein, MD;  Location[facility] ENDOSCOPY;  Service: Cardiovascular;  Laterality: N/A;   Social History:  reports that he has been smoking cigarettes.  He has been smoking about 0.50 packs per day. He has never used smokeless tobacco. He reports that he drinks about 8.4 oz of alcohol per week. He  reports that he does not use drugs.  Family / Support Systems Marital Status: Single Patient Roles: Partner, Other (Comment)(son, brother, employee) Spouse/Significant Other: Xavier Villegas - girlfriend of several months, but they have known each other for years Other Supports: Xavier Villegas - father - [phone]; Xavier Villegas - mother - [phone]; Xavier Villegas - older brother by 4 years - [phone] Anticipated Caregiver: parents, brother and hired caregiver during the day Ability/Limitations of Caregiver: Parents work day shift Caregiver Availability: 24/7 Family Dynamics: supportive family  Social History Preferred language: English Religion: None Read: Yes Write: Yes Employment Status: Employed 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 Legal History/Current Legal Issues: none reported Guardian/Conservator: Per MD, pt is not fully capable of making his own decision.  Pt's next of kin, his parents, would be the decision makers.   Abuse/Neglect Abuse/Neglect Assessment Can Be Completed: Unable to assess, patient is non-responsive or altered mental status(CSW will continue to assess as pt is able to communicate more.)  Emotional Status Pt's affect, behavior and adjustment status: Pt is aphasic and CSW cannot accurately assess how pt is adjusting.  He nodded to "yes" that he was "hanging in there" when CSW asked that question. Recent Psychosocial Issues: Pt's mother just had a serious car accident in March 2019 and is just now recovering from that. Psychiatric  History: none reported by father Substance Abuse History: none reported by father   Patient / Family Perceptions, Expectations & Goals Pt/Family understanding of illness & functional limitations: Pt's father reports a good  understanding of pt's functional limitations and has already begun to think about how to provide 24/7 supervision to pt at home after d/c.  He feels pt has already made improvements compared to where he was during acute hospital admission. Premorbid pt/family roles/activities: Pt enjoys riding his motorcycle he fixed up, driving his Mustang convertible, 4-wheeling, being outdoors - new crossbow, jeeps, and working 12 hours a day. Anticipated changes in roles/activities/participation: Pt would like to resume activities as he is able. Pt/family expectations/goals: Pt's father stated that he wants Xavier Villegas to be able to regain his ability to take care of himself as much as possible and get back to the life he was building.  Community Resources [employer]: None Premorbid Home Care/DME Agencies: None Transportation available at discharge: family Resource referrals recommended: Neuropsychology, Support group (specify)  Discharge Planning Living Arrangements: Parent(lives in parents' basement) Support Systems: Spouse/significant other, Parent, Other relatives, Friends/neighbors Type of Residence: Private residence Insurance Resources: Multimedia programmer (specify)([employer] [place]) Museum/gallery curator Resources: Employment, Secondary school teacher Screen Referred: No Living Expenses: Lives with family Money Management: Patient, Family Does the patient have any problems obtaining your medications?: No Home Management: Pt's family will take care of this while pt is recovering. Patient/Family Preliminary Plans: Pt's parents plan to be with pt at home and will hire help while they are at work during the day.  Pt's brother may also help when he can.  Father works at [employer] for [employer]. Social Work Anticipated Follow Up Needs: HH/OP, Support Group Expected length of stay: 3 to 4 weeks  Clinical Impression CSW met with pt to briefly introduce self and role of CSW, as well as get permission to  talk to his father.  CSW talked with pt's father to introduce self and role of CSW and to complete assessment.  Pt's father is grateful that pt is on CIR and has already seen improvements.  He is hopeful pt can regain his ability to care for himself, even if he needs supervision for a while.  Pt's mother recently in a serious car accident, but is recovering, but has some physical limitations.  Pt's older brother is also going to help some.  Pt will need a PCP and father would like CSW to help to find one.  Father has already completed and turned in pt's short term disability paperwork to his employer.  No other concerns/questions/needs, but CSW will remain available to assist as needed,  Shanyce Daris, [name] 03/03/2018, 6:10 PM

## 2018-03-03 NOTE — Progress Notes (Signed)
Occupational Therapy Session Note  Patient Details  Name: Xavier Villegas MRN: [id number] Date of Birth: 25-Jun-1981  Today's Date: 03/03/2018 OT Individual Time: 1062-6948 OT Individual Time Calculation (min): 57 min  20 missed minutes secondary to fatigue  Short Term Goals: Week 1:  OT Short Term Goal 1 (Week 1): Pt will perform toilet transfer with mod A to decrease level of assist with self care. OT Short Term Goal 2 (Week 1): Pt will perform toilet hygiene with mod A for standing balance.  OT Short Term Goal 3 (Week 1): Pt will perform LB dressing with mod A.  Skilled Therapeutic Interventions/Progress Updates:    Upon entering the room, pt sleeping soundly in room this session. Pt agreeable to OT intervention and nodding head for toileting. Pt performed sit>stand with mod A and max A stand pivot transfer into wheelchair with R knee blocked. Pt transferred onto toilet from wheelchair in same manner. Pt able to have successful BM with min A for sitting balance for safety. Second helper utilized to stand into STEDY for hygiene needs. Pt standing with min A standing balance while second helper cleaned buttocks. Pt able to sit on STEDY seat and wash peri area with mod A for balance secondary to R lateral lean. Pt transferred into wheelchair with use of STEDY and assisted to [employer]. Minimal external distractions while pt engaged in seated dynavision task with lower half of the board on. Pt able to hit 40 targets within 4 minute period and needing mod multimodal cues to locate items on R side and pt performing full head turn to scan to the R. On average pt able to hit targets on L side within 1.5 seconds but taking 8 seconds on average to locate targets on R. Pt appearing very fatigued at this point and returned to room. Max A stand pivot from wheelchair >bed with mod A for sit >supine. Bed alarm activated and call bell within reach upon exiting the room.   Therapy Documentation Precautions:   Precautions Precautions: Fall Precaution Comments: bone flap in L abdomen, no L side lying Restrictions Weight Bearing Restrictions: No General: General OT Amount of Missed Time: 20 Minutes Vital Signs:   Pain: Pain Assessment Pain Scale: 0-10 Pain Score: 2   See Function Navigator for Current Functional Status.   Therapy/Group: Individual Therapy  [name] 03/03/2018, 12:19 PM

## 2018-03-03 NOTE — Progress Notes (Signed)
Subjective/Complaints: Remains aphasic but is following simple commands  Review of systems cannot obtain secondary to aphasia Objective: Vital Signs: Blood pressure 120/81, pulse 75, temperature 97.6 F (36.4 C), temperature source Oral, resp. rate 19, height 6' (1.829 m), weight 83 kg (182 lb 15.7 oz), SpO2 99 %. No results found. Results for orders placed or performed during the hospital encounter of 03/01/18 (from the past 72 hour(s))  Comprehensive metabolic panel     Status: Abnormal   Collection Time: 03/02/18  6:50 AM  Result Value Ref Range   Sodium 132 (L) 135 - 145 mmol/L   Potassium 4.4 3.5 - 5.1 mmol/L   Chloride 94 (L) 98 - 111 mmol/L   CO2 25 22 - 32 mmol/L   Glucose, Bld 102 (H) 70 - 99 mg/dL   BUN 18 6 - 20 mg/dL   Creatinine, Ser 0.52 (L) 0.61 - 1.24 mg/dL   Calcium 9.6 8.9 - 10.3 mg/dL   Total Protein 7.2 6.5 - 8.1 g/dL   Albumin 3.5 3.5 - 5.0 g/dL   AST 57 (H) 15 - 41 U/L   ALT 116 (H) 0 - 44 U/L   Alkaline Phosphatase 125 38 - 126 U/L   Total Bilirubin 0.9 0.3 - 1.2 mg/dL   GFR calc non Af Amer >60 >60 mL/min   GFR calc Af Amer >60 >60 mL/min    Comment: (NOTE) The eGFR has been calculated using the CKD EPI equation. This calculation has not been validated in all clinical situations. eGFR's persistently <60 mL/min signify possible Chronic Kidney Disease.    Anion gap 13 5 - 15    Comment: Performed at [facility] [address] [place], [place] [address]  CBC with Differential/Platelet     Status: Abnormal   Collection Time: 03/02/18  7:40 AM  Result Value Ref Range   WBC 18.2 (H) 4.0 - 10.5 K/uL   RBC 4.30 4.22 - 5.81 MIL/uL   Hemoglobin 15.9 13.0 - 17.0 g/dL   HCT 45.8 39.0 - 52.0 %   MCV 106.5 (H) 78.0 - 100.0 fL   MCH 37.0 (H) 26.0 - 34.0 pg   MCHC 34.7 30.0 - 36.0 g/dL   RDW 12.5 11.5 - 15.5 %   Platelets 345 150 - 400 K/uL   Neutrophils Relative % 79 %   Neutro Abs 14.6 (H) 1.7 - 7.7 K/uL   Lymphocytes Relative 10 %   Lymphs Abs  1.8 0.7 - 4.0 K/uL   Monocytes Relative 8 %   Monocytes Absolute 1.4 (H) 0.1 - 1.0 K/uL   Eosinophils Relative 1 %   Eosinophils Absolute 0.2 0.0 - 0.7 K/uL   Basophils Relative 1 %   Basophils Absolute 0.1 0.0 - 0.1 K/uL   Immature Granulocytes 1 %   Abs Immature Granulocytes 0.1 0.0 - 0.1 K/uL    Comment: Performed at [facility], [address] [place], [place] [address]     HEENT: Left craniotomy incision with occlusive dressing Cardio: RRR and No murmurs Resp: CTA B/L and Unlabored GI: BS positive and Nontender nondistended Extremity:  No Edema Skin:   Other Herniotomy incision, no swelling or erythema around the borders of dressing Neuro: Abnormal Sensory Cannot assess secondary to aphasia, Abnormal Motor 0/5 right upper limb and right lower limb and Aphasic Musc/Skel:  Other No pain with upper or lower limb range of motion General no acute distress   Assessment/Plan: 1. Functional deficits secondary to left MCA infarct with  hemorrhagic transformation status post craniectomy which require 3+ hours per day of interdisciplinary therapy in a comprehensive inpatient rehab setting. Physiatrist is providing close team supervision and 24 hour management of active medical problems listed below. Physiatrist and rehab team continue to assess barriers to discharge/monitor patient progress toward functional and medical goals. FIM: Function - Bathing Position: Other (comment)(sitting in stedy) Body parts bathed by patient: Right arm, Chest, Abdomen, Front perineal area Body parts bathed by helper: Left arm, Buttocks, Right upper leg, Left upper leg, Right lower leg, Left lower leg, Back Assist Level: (max A)  Function- Upper Body Dressing/Undressing What is the patient wearing?: Hospital gown Assist Level: (total A) Function - Lower Body Dressing/Undressing What is the patient wearing?: Non-skid slipper socks Position: Bed Non-skid slipper socks- Performed by helper: Don/doff  right sock, Don/doff left sock Assist for footwear: Dependant Assist for lower body dressing: 2 Helpers  Function - Toileting Toileting activity did not occur: No continent bowel/bladder event  Function - Air cabin crew transfer activity did not occur: Safety/medical concerns Toilet transfer assistive device: Facilities manager lift: Stedy Assist level to toilet: Maximal assist (Pt 25 - 49%/lift and lower) Assist level from toilet: Maximal assist (Pt 25 - 49%/lift and lower)  Function - Chair/bed transfer Chair/bed transfer activity did not occur: Safety/medical concerns Chair/bed transfer method: Stand pivot Chair/bed transfer assist level: Maximal assist (Pt 25 - 49%/lift and lower) Chair/bed transfer assistive device: Armrests Chair/bed transfer details: Verbal cues for sequencing, Verbal cues for technique, Manual facilitation for weight shifting, Manual facilitation for placement, Verbal cues for precautions/safety, Visual cues for safe use of DME/AE, Visual cues/gestures for sequencing  Function - Locomotion: Wheelchair Will patient use wheelchair at discharge?: (tbd) Type: Manual Max wheelchair distance: 150' Assist Level: Dependent (Pt equals 0%) Assist Level: Dependent (Pt equals 0%) Assist Level: Dependent (Pt equals 0%) Turns around,maneuvers to table,bed, and toilet,negotiates 3% grade,maneuvers on rugs and over doorsills: No Function - Locomotion: Ambulation Ambulation activity did not occur: Safety/medical concerns Assistive device: Rail in hallway Max distance: 30' Assist level: 2 helpers(max for gait, +2 for w/c follow) Assist level: 2 helpers Walk 50 feet with 2 turns activity did not occur: Safety/medical concerns Walk 150 feet activity did not occur: Safety/medical concerns Walk 10 feet on uneven surfaces activity did not occur: Safety/medical concerns  Function - Comprehension Comprehension: Auditory Comprehension assist level: Understands  basic 75 - 89% of the time/ requires cueing 10 - 24% of the time  Function - Expression Expression: Verbal Expression assist level: Expresses basic 25 - 49% of the time/requires cueing 50 - 75% of the time. Uses single words/gestures.  Function - Social Interaction Social Interaction assist level: Interacts appropriately 50 - 74% of the time - May be physically or verbally inappropriate.  Function - Problem Solving Problem solving assist level: Solves basic 25 - 49% of the time - needs direction more than half the time to initiate, plan or complete simple activities  Function - Memory Memory assist level: Recognizes or recalls 25 - 49% of the time/requires cueing 50 - 75% of the time Patient normally able to recall (first 3 days only): (difficult to assess 2/2 language impairment)   Medical Problem List and Plan: 1.  Right hemiparesis, dysphagia, and aphasia secondary to large left MCA infarct with hemorrhagic transformation s/p hemicraniectomy. CIR PT, OT, SLP 2.  DVT Prophylaxis/Anticoagulation: Mechanical: Sequential compression devices, below knee Bilateral lower extremities 3. Pain Management: Tylenol as needed 4. Mood: Consult LCSW  for  evaluation and support 5. Neuropsych: This patient is not fully capable of making decisions on his own behalf. 6. Skin/Wound Care: Routine pressure relief measures.  Monitor wound for healing.  Maintain adequate nutritional status. 7. Fluids/Electrolytes/Nutrition: Monitor I's/O's.   Encourage nectar thick fluid intake.  Bmet normal 03/02/2018, I 83m Meal intake 20-66% 8. Dysphagia: Continue dysphagia 1, nectar liquids--offer fluids frequently. Off IVF- intake variable will monitor, no sign of asp 9. Leucocytosis: ?Progressive and felt to be reactive--ID recommends d/c antibiotic and monitor for signs of infection. WBC 15 at admission-->19/12---> 17.0.  18 K today, remains afebrile mother with history of CLL. 10. High homocysteine levels/low folate  levels/boderline low B12: On high dose folic supplement, B6 and received B12 inj 7/14  . MRHFR--heterozygous /single mutation ID. Tobacco cessation encouraged. May cause abnormal clotting 12. GERD: Continue PPI.     LOS (Days) 2 A FACE TO FACE EVALUATION WAS PERFORMED  ACharlett Blake7/25/2019, 10:04 AM

## 2018-03-03 NOTE — Progress Notes (Signed)
Physical Therapy Session Note  Patient Details  Name: Xavier Villegas MRN: [id number] Date of Birth: [birth year]  Today's Date: 03/03/2018 PT Individual Time: 1300-1400 PT Individual Time Calculation (min): 60 min   Short Term Goals: Week 1:  PT Short Term Goal 1 (Week 1): Pt will scan to R environment in 50% of opportunities with min cues PT Short Term Goal 2 (Week 1): Pt will ambulate 30' with +1 assist PT Short Term Goal 3 (Week 1): Pt will initiate stair training with PT PT Short Term Goal 4 (Week 1): Pt will demo dynamic sitting balance with consistent min assist PT Short Term Goal 5 (Week 1): Pt will propel w/c 50' with min assist  Skilled Therapeutic Interventions/Progress Updates:    Pt received in bed. Therapist assisted Pt with donning shorts and t- shirt with mod A. Pt attempted to don shorts onto UE's requiring VC's and assistance for donning shorts through LE's. Pt performed supine> sitting EOB with min A and required mod A to don shirt with increased assistance for R UE>LUE. Pt demonstrated posterior leaning sitting EOB requiring min A from PT for steadying. Pt performed stand pivot transfer EOB> w/c requiring max A for boosting, pivoting, and eccentric lower. PT blocked R LE for prevention of buckling and Pt demonstrated quick lower to chair requiring PT to control descent. Pt propelled w/c with L LE and L UE with mod A for navigating through halls and multimodal cues for L LE activation during functional mobility. Pt ambulated 1x5 feet and  1x15 feet max A +1 and w/c follow with 2nd helper. During ambulation, PT facilitated R LE stepping, R knee blocking for buckle prevention, tactile cues for R quad activation during stance, and pelvis facilitation for proper weight shifting for L LE off loading for advancement during swing. Pt held on to L hand rail for UE assist and able to step with L LE without facilitation. Pt performed stand pivot transfer from w/c>treatment mat with max  A with increased efficiency transferring towards stronger L side. Pt performed card matching working on R inattention, forced WB through R UE, sitting balance with CGA, and problem solving. Pt performed stand pivot from mat>w/c max A and therapist propelled chair back to room for time management. Pt performed stand pivot from w/c> bed with max A and performed sitting EOB> supine min A for LE's. Pt returned to supine with tray table and call bell in reach and all needs met.   Therapy Documentation Precautions:  Precautions Precautions: Fall Precaution Comments: bone flap in L abdomen, no L side lying Restrictions Weight Bearing Restrictions: No   See Function Navigator for Current Functional Status.   Therapy/Group: Individual Therapy  Floreen Comber 03/03/2018, 4:43 PM

## 2018-03-03 NOTE — Progress Notes (Signed)
Inpatient [facility] Individual Statement of Services  Patient Name:  Xavier Villegas  Date:  03/03/2018  Welcome to the [facility].  Our goal is to provide you with an individualized program based on your diagnosis and situation, designed to meet your specific needs.  With this comprehensive rehabilitation program, you will be expected to participate in at least 3 hours of rehabilitation therapies Monday-Friday, with modified therapy programming on the weekends.  Your rehabilitation program will include the following services:  Physical Therapy (PT), Occupational Therapy (OT), Speech Therapy (ST), 24 hour per day rehabilitation nursing, Therapeutic Recreaction (TR), Neuropsychology, Case Management (Social Worker), Rehabilitation Medicine, Nutrition Services and Pharmacy Services  Weekly team conferences will be held on Tuesdays to discuss your progress.  Your Social Worker will talk with you frequently to get your input and to update you on team discussions.  Team conferences with you and your family in attendance may also be held.  Expected length of stay:  3 to 4 weeks  Overall anticipated outcome:  Supervision with minimal assistance for stairs  Depending on your progress and recovery, your program may change. Your Social Worker will coordinate services and will keep you informed of any changes. Your Social Worker's name and contact numbers are listed  below.  The following services may also be recommended but are not provided by the [facility] will be made to provide these services after discharge if needed.  Arrangements include referral to agencies that provide these services.  Your insurance has been verified to be:  [employer] Your primary doctor is:  You do not  currently have one, but Sonia Baller will help you to find someone prior to your discharge.  Pertinent information will be shared with your doctor and your insurance company.  Social Worker:  Alfonse Alpers, LCSW  [phone] or ([phone]  Information discussed with and copy given to patient by: Trey Sailors, 03/03/2018, 1:10 PM

## 2018-03-04 ENCOUNTER — Inpatient Hospital Stay (HOSPITAL_COMMUNITY): Payer: BLUE CROSS/BLUE SHIELD

## 2018-03-04 ENCOUNTER — Inpatient Hospital Stay (HOSPITAL_COMMUNITY): Payer: BLUE CROSS/BLUE SHIELD | Admitting: Physical Therapy

## 2018-03-04 ENCOUNTER — Inpatient Hospital Stay (HOSPITAL_COMMUNITY): Payer: BLUE CROSS/BLUE SHIELD | Admitting: Occupational Therapy

## 2018-03-04 MED ORDER — ASPIRIN 325 MG PO TABS
325.0000 mg | ORAL_TABLET | Freq: Every day | ORAL | Status: DC
  Administered 2018-03-05 – 2018-04-06 (×33): 325 mg via ORAL
  Filled 2018-03-04 (×34): qty 1

## 2018-03-04 NOTE — Progress Notes (Signed)
Physical Therapy Session Note  Patient Details  Name: Xavier Villegas MRN: [id number] Date of Birth: [birth year]  Today's Date: 03/04/2018 PT Individual Time: 1000-1100 PT Individual Time Calculation (min): 60 min   Short Term Goals: Week 1:  PT Short Term Goal 1 (Week 1): Pt will scan to R environment in 50% of opportunities with min cues PT Short Term Goal 2 (Week 1): Pt will ambulate 30' with +1 assist PT Short Term Goal 3 (Week 1): Pt will initiate stair training with PT PT Short Term Goal 4 (Week 1): Pt will demo dynamic sitting balance with consistent min assist PT Short Term Goal 5 (Week 1): Pt will propel w/c 50' with min assist  Skilled Therapeutic Interventions/Progress Updates:    Pt received supine in bed, agreeable to PT. No complaints of and no body language indicating pain. Supine to sit with min A and use of bedrail. Assisted pt with dressing while seated EOB, pt needs min A to don shirt correctly and max A to don pants and shoes. Sit to stand with mod A EOB to pull up pants. Squat pivot transfer bed to w/c with mod A to the L. Manual w/c propulsion x 150 ft with L UE/LE and mod A for steering, v/c to attend to R visual field. Sit to stand with mod A to rail in hallway. Pt is able to take a few steps fwd with max A for balance, weight shift L/R, and to advance RLE. Standing weight shift L/R with use of mirror for visual feedback and max manual cues to initiate weight shift, R knee blocked. Standing 1" step-taps alt L/R with max A for standing balance and for RLE initiation. Pt left semi-reclined in bed at end of session, needs in reach, bed alarm in place.  Therapy Documentation Precautions:  Precautions Precautions: Fall Precaution Comments: bone flap in L abdomen, no L side lying Restrictions Weight Bearing Restrictions: No  See Function Navigator for Current Functional Status.   Therapy/Group: Individual Therapy  Excell Seltzer, PT, DPT  03/04/2018, 12:21 PM

## 2018-03-04 NOTE — Progress Notes (Addendum)
Subjective/Complaints: Aphasic, able to open mouth to command but not touch nose with Left hand  Review of systems cannot obtain secondary to aphasia Objective: Vital Signs: Blood pressure 125/89, pulse 85, temperature 97.6 F (36.4 C), temperature source Oral, resp. rate 18, height 6' (1.829 m), weight 83 kg (182 lb 15.7 oz), SpO2 97 %. No results found. Results for orders placed or performed during the hospital encounter of 03/01/18 (from the past 72 hour(s))  Comprehensive metabolic panel     Status: Abnormal   Collection Time: 03/02/18  6:50 AM  Result Value Ref Range   Sodium 132 (L) 135 - 145 mmol/L   Potassium 4.4 3.5 - 5.1 mmol/L   Chloride 94 (L) 98 - 111 mmol/L   CO2 25 22 - 32 mmol/L   Glucose, Bld 102 (H) 70 - 99 mg/dL   BUN 18 6 - 20 mg/dL   Creatinine, Ser 0.52 (L) 0.61 - 1.24 mg/dL   Calcium 9.6 8.9 - 10.3 mg/dL   Total Protein 7.2 6.5 - 8.1 g/dL   Albumin 3.5 3.5 - 5.0 g/dL   AST 57 (H) 15 - 41 U/L   ALT 116 (H) 0 - 44 U/L   Alkaline Phosphatase 125 38 - 126 U/L   Total Bilirubin 0.9 0.3 - 1.2 mg/dL   GFR calc non Af Amer >60 >60 mL/min   GFR calc Af Amer >60 >60 mL/min    Comment: (NOTE) The eGFR has been calculated using the CKD EPI equation. This calculation has not been validated in all clinical situations. eGFR's persistently <60 mL/min signify possible Chronic Kidney Disease.    Anion gap 13 5 - 15    Comment: Performed at [facility] [address], [place], [place] [address]  CBC with Differential/Platelet     Status: Abnormal   Collection Time: 03/02/18  7:40 AM  Result Value Ref Range   WBC 18.2 (H) 4.0 - 10.5 K/uL   RBC 4.30 4.22 - 5.81 MIL/uL   Hemoglobin 15.9 13.0 - 17.0 g/dL   HCT 45.8 39.0 - 52.0 %   MCV 106.5 (H) 78.0 - 100.0 fL   MCH 37.0 (H) 26.0 - 34.0 pg   MCHC 34.7 30.0 - 36.0 g/dL   RDW 12.5 11.5 - 15.5 %   Platelets 345 150 - 400 K/uL   Neutrophils Relative % 79 %   Neutro Abs 14.6 (H) 1.7 - 7.7 K/uL   Lymphocytes  Relative 10 %   Lymphs Abs 1.8 0.7 - 4.0 K/uL   Monocytes Relative 8 %   Monocytes Absolute 1.4 (H) 0.1 - 1.0 K/uL   Eosinophils Relative 1 %   Eosinophils Absolute 0.2 0.0 - 0.7 K/uL   Basophils Relative 1 %   Basophils Absolute 0.1 0.0 - 0.1 K/uL   Immature Granulocytes 1 %   Abs Immature Granulocytes 0.1 0.0 - 0.1 K/uL    Comment: Performed at [facility], [address] [place], [place] [address]     HEENT: Left craniotomy incision with occlusive dressing Cardio: RRR and No murmurs Resp: CTA B/L and Unlabored GI: BS positive and Nontender nondistended Extremity:  No Edema Skin:   Other Herniotomy incision, no swelling or erythema around the borders of dressing Neuro: Abnormal Sensory Cannot assess secondary to aphasia, Abnormal Motor 0/5 right upper limb and right lower limb and Aphasic Musc/Skel:  Other No pain with upper or lower limb range of motion General no acute distress   Assessment/Plan: 1. Functional  deficits secondary to left MCA infarct with hemorrhagic transformation status post craniectomy which require 3+ hours per day of interdisciplinary therapy in a comprehensive inpatient rehab setting. Physiatrist is providing close team supervision and 24 hour management of active medical problems listed below. Physiatrist and rehab team continue to assess barriers to discharge/monitor patient progress toward functional and medical goals. FIM: Function - Bathing Position: Other (comment)(sitting in stedy) Body parts bathed by patient: Right arm, Chest, Abdomen, Front perineal area Body parts bathed by helper: Left arm, Buttocks, Right upper leg, Left upper leg, Right lower leg, Left lower leg, Back Assist Level: (max A)  Function- Upper Body Dressing/Undressing What is the patient wearing?: Hospital gown Assist Level: (total A) Function - Lower Body Dressing/Undressing What is the patient wearing?: Non-skid slipper socks Position: Bed Non-skid slipper socks-  Performed by helper: Don/doff right sock, Don/doff left sock Assist for footwear: Dependant Assist for lower body dressing: 2 Helpers  Function - Toileting Toileting activity did not occur: No continent bowel/bladder event Toileting steps completed by helper: Adjust clothing prior to toileting, Performs perineal hygiene, Adjust clothing after toileting Toileting Assistive Devices: Grab bar or rail Assist level: Two helpers  Function - Air cabin crew transfer activity did not occur: Safety/medical concerns Toilet transfer assistive device: Facilities manager lift: Stedy Assist level to toilet: Maximal assist (Pt 25 - 49%/lift and lower) Assist level from toilet: Maximal assist (Pt 25 - 49%/lift and lower)  Function - Chair/bed transfer Chair/bed transfer activity did not occur: Safety/medical concerns Chair/bed transfer method: Stand pivot Chair/bed transfer assist level: Maximal assist (Pt 25 - 49%/lift and lower) Chair/bed transfer assistive device: Armrests Chair/bed transfer details: Verbal cues for technique, Manual facilitation for weight shifting, Manual facilitation for placement, Verbal cues for precautions/safety, Visual cues for safe use of DME/AE, Visual cues/gestures for sequencing, Tactile cues for weight shifting, Tactile cues for sequencing, Tactile cues for placement, Verbal cues for sequencing  Function - Locomotion: Wheelchair Will patient use wheelchair at discharge?: (tbd) Type: Manual Max wheelchair distance: 100 Assist Level: Moderate assistance (Pt 50 - 74%) Assist Level: Moderate assistance (Pt 50 - 74%) Assist Level: Dependent (Pt equals 0%) Turns around,maneuvers to table,bed, and toilet,negotiates 3% grade,maneuvers on rugs and over doorsills: No Function - Locomotion: Ambulation Ambulation activity did not occur: Safety/medical concerns Assistive device: Rail in hallway Max distance: 15 Assist level: 2 helpers(1 person providing max A  and second helper w/c follow) Assist level: 2 helpers Walk 50 feet with 2 turns activity did not occur: Safety/medical concerns Walk 150 feet activity did not occur: Safety/medical concerns Walk 10 feet on uneven surfaces activity did not occur: Safety/medical concerns  Function - Comprehension Comprehension: Auditory Comprehension assist level: Understands basic 75 - 89% of the time/ requires cueing 10 - 24% of the time  Function - Expression Expression: Verbal Expression assist level: Expresses basic 25 - 49% of the time/requires cueing 50 - 75% of the time. Uses single words/gestures.  Function - Social Interaction Social Interaction assist level: Interacts appropriately 75 - 89% of the time - Needs redirection for appropriate language or to initiate interaction.  Function - Problem Solving Problem solving assist level: Solves basic 25 - 49% of the time - needs direction more than half the time to initiate, plan or complete simple activities  Function - Memory Memory assist level: Recognizes or recalls 25 - 49% of the time/requires cueing 50 - 75% of the time Patient normally able to recall (first 3 days only): That he  or she is in a hospital   Medical Problem List and Plan: 1.  Right hemiparesis, dysphagia, and aphasia secondary to large left MCA infarct with hemorrhagic transformation s/p hemicraniectomy. CIR PT, OT, SLP 2.  DVT Prophylaxis/Anticoagulation: Mechanical: Sequential compression devices, below knee Bilateral lower extremities 3. Pain Management: Tylenol as needed 4. Mood: Consult LCSW  for evaluation and support 5. Neuropsych: This patient is not fully capable of making decisions on his own behalf. 6. Skin/Wound Care: Routine pressure relief measures.  Monitor wound for healing.  Maintain adequate nutritional status. 7. Fluids/Electrolytes/Nutrition: Monitor I's/O's.   Encourage nectar thick fluid intake.  Bmet normal 03/02/2018, I 852m Meal intake 5-66% 8.  Dysphagia: Continue dysphagia 1, nectar liquids--offer fluids frequently. Off IVF- intake variable will monitor, no sign of asp, fluid intake 10767mno IVF for now 9. Leucocytosis: ?Progressive and felt to be reactive--ID recommends d/c antibiotic and monitor for signs of infection. WBC 15 at admission-->19/12---> 17.0.  18 K 7/25 remains afebrile mother with history of CLL. 10. High homocysteine levels/low folate levels/boderline low B12: On high dose folic supplement, B6 and received B12 inj 7/14  . MRHFR--heterozygous /single mutation ID.  May cause abnormal clotting 12. GERD: Continue PPI.     LOS (Days) 3 A FACE TO FACE EVALUATION WAS PERFORMED  AnCharlett Blake/26/2019, 7:40 AM

## 2018-03-04 NOTE — Progress Notes (Signed)
Occupational Therapy Session Note  Patient Details  Name: Xavier Villegas MRN: [id number] Date of Birth: [birth year]  Today's Date: 03/04/2018 OT Individual Time: 1303-1400 OT Individual Time Calculation (min): 57 min   Short Term Goals: Week 1:  OT Short Term Goal 1 (Week 1): Pt will perform toilet transfer with mod A to decrease level of assist with self care. OT Short Term Goal 2 (Week 1): Pt will perform toilet hygiene with mod A for standing balance.  OT Short Term Goal 3 (Week 1): Pt will perform LB dressing with mod A.  Skilled Therapeutic Interventions/Progress Updates:    Pt greeted supine in bed, nodding yes when asked if he needed to use the bathroom. Tx focus on midline orientation, trunk control, cognition, and Rt NMR during self care tasks. He first donned sneakers when therapist placed R LE in reclined figure 4. He completed sit<stand from bed using Stedy with Min A. Min-Mod A for neutral alignment and keeping R UE on Stedy bar due to Rt lean. Pt assisting with clothing mgt with Mod A for balance with decreased Rt sided awareness. With increased time, he was unable to void. Soiled brief changed. Pt transferred back to w/c and completed handwashing and oral care with supervision and [facility] for integrating R UE as gross stabilizer. Pt then started pushing himself away from from sink with distressed look on face, gesturing towards bathroom. He transferred back to toilet in manner as written above using Stedy. He had voided bladder in clean brief. Focused on pt maintaining static/dynamic sitting and standing balance in [place] during toileting tasks. He then donned a clean shirt with Min A and cues for hemi techniques. Transfer back to bed completed with Stedy. Pt left with all needs within reach and bed alarm set.   Therapy Documentation Precautions:  Precautions Precautions: Fall Precaution Comments: bone flap in L abdomen, no L side lying Restrictions Weight Bearing  Restrictions: No Vital Signs: Therapy Vitals Temp: 98.4 F (36.9 C) Temp Source: Oral Pulse Rate: 93 Resp: 18 BP: 136/83 Patient Position (if appropriate): Lying Oxygen Therapy SpO2: 96 % O2 Device: Room Air Pain: Nodding and gesturing towards stomach when asked if he had pain. RN made aware.  Pain Assessment Pain Scale: Faces Faces Pain Scale: Hurts little more Pain Type: Acute pain Pain Location: Head Pain Descriptors / Indicators: Aching;Headache Pain Frequency: Intermittent Pain Onset: Gradual Patients Stated Pain Goal: 1 Pain Intervention(s): Medication (See eMAR) ADL:       See Function Navigator for Current Functional Status.   Therapy/Group: Individual Therapy  [name] A Hanni Milford 03/04/2018, 3:54 PM

## 2018-03-04 NOTE — IPOC Note (Signed)
Overall Plan of Care [facility]) Patient Details Name: Xavier Villegas MRN: [id number] DOB: [birth year]  Admitting Diagnosis: <principal problem not specified>  Hospital Problems: Active Problems:   Acute ischemic right MCA stroke [facility])     Functional Problem List: Nursing Bladder, Bowel, Endurance, Medication Management, Safety, Nutrition  PT Balance, Endurance, Motor, Perception, Safety  OT Balance, Behavior, Cognition, Vision, Endurance, Motor, Pain, Perception, Safety  SLP Cognition, Linguistic, Motor, Nutrition  TR         Basic ADL's: OT Grooming, Bathing, Dressing, Toileting     Advanced  ADL's: OT       Transfers: PT Bed Mobility, Bed to Chair, Car, Manufacturing systems engineer, Metallurgist: PT Stairs, Emergency planning/management officer, Ambulation     Additional Impairments: OT None  SLP Swallowing, Communication, Social Cognition comprehension, expression Attention, Awareness, Problem Solving  TR      Anticipated Outcomes Item Anticipated Outcome  Self Feeding n/a  Swallowing  Supervision    Basic self-care  S  Toileting  CGA   Bathroom Transfers CGA  Bowel/Bladder  manage with mod I assist  Transfers  supervision  Locomotion  min guard with LRAD ambulatory, supervision w/c for community distances  Communication  Min assist   Cognition  Min assist   Pain     Safety/Judgment  maintain safety with cues/reminders/supervison   Therapy Plan: PT Intensity: Minimum of 1-2 x/day ,45 to 90 minutes PT Frequency: 5 out of 7 days PT Duration Estimated Length of Stay: 24-28 days OT Intensity: Minimum of 1-2 x/day, 45 to 90 minutes OT Frequency: 5 out of 7 days OT Duration/Estimated Length of Stay: 24-28 days SLP Intensity: Minumum of 1-2 x/day, 30 to 90 minutes SLP Frequency: 3 to 5 out of 7 days SLP Duration/Estimated Length of Stay: 21-28 days     Team Interventions: Nursing Interventions Patient/Family Education, Skin Care/Wound Management,  Discharge Planning, Bladder Management, Cognitive Remediation/Compensation, Bowel Management, Medication Management, Dysphagia/Aspiration Precaution Training  PT interventions Ambulation/gait training, Balance/vestibular training, Cognitive remediation/compensation, Discharge planning, Community reintegration, DME/adaptive equipment instruction, Functional electrical stimulation, Functional mobility training, Patient/family education, Neuromuscular re-education, Psychosocial support, Splinting/orthotics, Therapeutic Exercise, Therapeutic Activities, Stair training, UE/LE Strength taining/ROM, Wheelchair propulsion/positioning, UE/LE Coordination activities, Visual/perceptual remediation/compensation  OT Interventions Balance/vestibular training, Neuromuscular re-education, Self Care/advanced ADL retraining, Therapeutic Exercise, Wheelchair propulsion/positioning, Cognitive remediation/compensation, DME/adaptive equipment instruction, Pain management, UE/LE Strength taining/ROM, Academic librarian, Barrister's clerk education, UE/LE Coordination activities, Discharge planning, Functional mobility training, Psychosocial support, Therapeutic Activities  SLP Interventions Cognitive remediation/compensation, Cueing hierarchy, Dysphagia/aspiration precaution training, Functional tasks, Patient/family education, Environmental controls, Internal/external aids, Multimodal communication approach, Speech/Language facilitation  TR Interventions    SW/CM Interventions Discharge Planning, Psychosocial Support, Patient/Family Education   Barriers to Discharge MD  Medical stability and Nutritional means  Nursing      PT Inaccessible home environment, Decreased caregiver support    OT Medical stability, Home environment access/layout    SLP      SW       Team Discharge Planning: Destination: PT-Home ,OT- Home , SLP-Home Projected Follow-up: PT-Home health PT, 24 hour supervision/assistance, OT-  Outpatient  OT, 24 hour supervision/assistance, SLP-Home Health SLP, Outpatient SLP, 24 hour supervision/assistance Projected Equipment Needs: PT-To be determined, OT- To be determined, SLP-To be determined Equipment Details: PT- , OT-  Patient/family involved in discharge planning: PT- Patient unable/family or caregiver not available,  OT-Patient, SLP-Patient  MD ELOS: 22-25d Medical Rehab Prognosis:  Good Assessment:  37 year old male with history of tobacco use otherwise in  good health, and was admitted on 02/19/2018 after found down on the floor by girlfriend.  History taken from chart review and girlfriend. He was found to have aphasia with right-sided weakness and CT head reviewed, showing left MCA infarct.  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  He underwent cerebral Angio with  mechanical thrombectomy of left MCA with reperfusion and confirmation of acute left ICA dissection as source of emboli.  Question stroke due to dissection due to reports of striking his head a couple of weeks prior to admission.  CT postprocedure showed subarachnoid hemorrhage with hemorrhagic transformation and follow-up CT head done revealing progressive cytotoxic edema with slight mass-effect and midline shift.  He was found to have dilated left pupil and Dr. Saintclair Halsted was consulted for input.  Patient underwent left decompressive hemicraniectomy with implantation of cranial flap left middle quadrant on 7/15.  2D echo done showing EF of 55 to 60% with medium size mobile mass on the aspect of the aortic valve suspicious for vegetation.  Bilateral lower extreme Dopplers were negative for deep DVT.  Carotid Doppler showed total occlusion of left ICA.  He was treated with hypertonic saline and serial CCT showed evolution of large L-MCA infarct with no change in mass effect or left to right midline shift.  Mentation is improving and he was started on  dysphagia 1, nectar liquids.    Now requiring 24/7 Rehab RN,MD, as well as CIR level PT, OT and SLP.  Treatment team will focus on ADLs and mobility with goals set at [facility]   See Team Conference Notes for weekly updates to the plan of care

## 2018-03-04 NOTE — Progress Notes (Signed)
Physical Therapy Session Note  Patient Details  Name: Xavier Villegas MRN: [id number] Date of Birth: 01-20-1981  Today's Date: 03/04/2018 PT Individual Time: 1600-1630 PT Individual Time Calculation (min): 30 min   Short Term Goals: Week 1:  PT Short Term Goal 1 (Week 1): Pt will scan to R environment in 50% of opportunities with min cues PT Short Term Goal 2 (Week 1): Pt will ambulate 30' with +1 assist PT Short Term Goal 3 (Week 1): Pt will initiate stair training with PT PT Short Term Goal 4 (Week 1): Pt will demo dynamic sitting balance with consistent min assist PT Short Term Goal 5 (Week 1): Pt will propel w/c 50' with min assist  Skilled Therapeutic Interventions/Progress Updates:    Pt supine in bed upon PT arrival, agreeable to therapy tx and denies pain. Pt transferred from supine>sitting EOB with min assist, therapist assisted pt to don shoes/socks for time management. Pt performed squat pivot to w/c with mod assist, verbal cues for techniques. Pt transported to the gym. Pt performed x 4 sit<>stands at the rail this session with mod assist and use of L rail, verbal cues for upright posture, therapist blocking R knee and tactile cues for R quad activation. Pt performed pre-gait in standing with each LE, total assist to advance R LE forward/back. Pt transported back to room and transferred to bed mod assist, stand pivot with bedrail. Pt transferred to supine and left with needs in reach and bed alarm set.   Therapy Documentation Precautions:  Precautions Precautions: Fall Precaution Comments: bone flap in L abdomen, no L side lying Restrictions Weight Bearing Restrictions: No   See Function Navigator for Current Functional Status.   Therapy/Group: Individual Therapy  Netta Corrigan, PT, DPT 03/04/2018, 1:07 PM

## 2018-03-04 NOTE — Progress Notes (Signed)
Speech Language Pathology Daily Session Note  Patient Details  Name: Xavier Villegas MRN: [id number] Date of Birth: [birth year]  Today's Date: 03/04/2018 SLP Individual Time: 0830-0930 SLP Individual Time Calculation (min): 60 min  Short Term Goals: Week 1: SLP Short Term Goal 1 (Week 1): Pt will consume trials of dysphagia 2 and nectar thick liquids with min cues for use of swallowing precautions and minimal overt s/s of aspiration to demonstrate readiness for diet upgrade.  SLP Short Term Goal 1 - Progress (Week 1): Updated due to goal met SLP Short Term Goal 2 (Week 1): Pt will consume therapeutic trials of thin liquids with min cues for use of swallowing precautions and minimal overt s/s of aspiration.  SLP Short Term Goal 3 (Week 1): Pt will voice at the vocalic syllble level in 50% of opportunities with max assist multimodal cues.   SLP Short Term Goal 4 (Week 1): Pt will follow 1 step commands during basic, familiar tasks for >50% accuracy with mod assist multimodal cues.   SLP Short Term Goal 5 (Week 1): Pt will complete basic, familiar ADLs with mod assist for functional problem solving.   SLP Short Term Goal 6 (Week 1): Pt will locate items to the right of midline during basic, familiar tasks with min assist multimodal cues.    Skilled Therapeutic Interventions: Skilled ST services focused on swallow and cognitive skills. SLP facilitated trials of dys 2, with appropiate oral clearance, timely mastication and dys 3 trials with slightly prolonged mastication however demonstrated oral clearance. SLP upgraded goal to reflect solid trials and will continue trials with plans for meal tray in future session assess tolerance over a meal. SLP facilitated voicing of single vowel sounds in direct imitation no abilitiy noted, likely expected due to apraxia, however in spontaneous speech pt vocalized 1x vowel sound "I" and 2x word/phrases "alright" and "ah okay." Pt demonstrated ability to  follow 1 step basic body movement directions, pt demonstrated 2/10 accuracy on first attempt and 3/10 accuracy on 2nd attempt and 10/10 with return demonstration/imitation. SLP facilitated basic-mildly complex problem solving skills, utilizing 3-5 step picture cards of functional activities, pt demonstrated Mod I with 3 step, and required mod-min A verbal cues with 4-5 step cards. Pt demonstrated ability to locate cards to the right of midline with min-supervision A verbal/question cues. SLP facilitated familiar problem solving skills utilizing basic money management, pt demonstrated ability to display requested change with Mod A verbal cues, however required max-total assist to identify coins by name, likely due to language component. SLP facilitated oral care piror to PO consumption trials of ice chips, thin liquid via tsp and cup, pt required min-mod A cues to complete oral care in ADL problem solving task. Pt demonstrated no overt s/s aspiration with ice chips and thin via tsp, however demonstrated sensenated aspiration on thin via cup on 3/5 trials. Pt will need a repeat FEES piror to future liquid upgrade. Pt was left room with call bell within reach and bed alarm set. ST recommends to continue skilled ST services.      Function:  Eating Eating   Modified Consistency Diet: Yes(dys 2, dys 3 and thin trials with ST only) Eating Assist Level: Supervision or verbal cues;Helper checks for pocketed food;Help with picking up utensils           Cognition Comprehension Comprehension assist level: Understands basic 75 - 89% of the time/ requires cueing 10 - 24% of the time  Expression   Expression assist level:  Expresses basic 25 - 49% of the time/requires cueing 50 - 75% of the time. Uses single words/gestures.  Social Interaction Social Interaction assist level: Interacts appropriately 75 - 89% of the time - Needs redirection for appropriate language or to initiate interaction.  Problem Solving  Problem solving assist level: Solves basic 75 - 89% of the time/requires cueing 10 - 24% of the time;Solves basic 50 - 74% of the time/requires cueing 25 - 49% of the time  Memory Memory assist level: Recognizes or recalls 25 - 49% of the time/requires cueing 50 - 75% of the time    Pain Pain Assessment Pain Score: 0-No pain  Therapy/Group: Individual Therapy  Daielle Melcher  [facility] 03/04/2018, 11:38 AM

## 2018-03-05 ENCOUNTER — Inpatient Hospital Stay (HOSPITAL_COMMUNITY): Payer: BLUE CROSS/BLUE SHIELD | Admitting: Occupational Therapy

## 2018-03-05 ENCOUNTER — Inpatient Hospital Stay (HOSPITAL_COMMUNITY): Payer: BLUE CROSS/BLUE SHIELD

## 2018-03-05 ENCOUNTER — Inpatient Hospital Stay (HOSPITAL_COMMUNITY): Payer: BLUE CROSS/BLUE SHIELD | Admitting: Physical Therapy

## 2018-03-05 DIAGNOSIS — I69391 Dysphagia following cerebral infarction: Secondary | ICD-10-CM

## 2018-03-05 DIAGNOSIS — D72829 Elevated white blood cell count, unspecified: Secondary | ICD-10-CM

## 2018-03-05 DIAGNOSIS — E871 Hypo-osmolality and hyponatremia: Secondary | ICD-10-CM

## 2018-03-05 NOTE — Progress Notes (Addendum)
Physical Therapy Session Note  Patient Details  Name: Xavier Villegas MRN: [id number] Date of Birth: 04-26-1981  Today's Date: 03/05/2018 PT Individual Time: 0852-1001 PT Individual Time Calculation (min): 69 min   Short Term Goals: Week 1:  PT Short Term Goal 1 (Week 1): Pt will scan to R environment in 50% of opportunities with min cues PT Short Term Goal 2 (Week 1): Pt will ambulate 30' with +1 assist PT Short Term Goal 3 (Week 1): Pt will initiate stair training with PT PT Short Term Goal 4 (Week 1): Pt will demo dynamic sitting balance with consistent min assist PT Short Term Goal 5 (Week 1): Pt will propel w/c 50' with min assist  Skilled Therapeutic Interventions/Progress Updates:  Pt received in bed with RN administering meds. Discussed pt's potential need for helmet & RN to f/u with MD on call but no restrictions known at this point.  Pt transferred supine>sitting EOB with mod assist to ensure pt did not slide off edge of bed as he was very close to it. Therapist provided total assist for donning socks & shoes while pt sat EOB with close supervision for balance with 1UE support. Pt completes bed<>w/c via stand pivot with mod assist as pt unable to weight shift nor advance RLE to assist with pivot portion. Pt nonverbal throughout session, only responding with head nods/shakes. At sink pt washes hands from w/c level with max assist and max cuing as pt initially putting soap on face/mouth & requiring cuing to wash hands. Pt requires max assist and cuing to wash RUE as well. Pt demonstrates impulsivity during session, when performing transfers and while washing hands at sink, requiring cuing to slow down. Transported pt around unit via w/c total assist for time management. In dayroom pt initially reporting he wanted to eat breakfast but when tray was set up he only consumed milk with max cuing but poor demo of small sips. In gym pt propelled w/c x 100 ft with min assist with L hemi  technique, then transitioned to propelling w/c between cones with max cuing to attend to & avoid cones 50% of the time. At high/low table in dayroom pt performed sit>stand with min assist and tolerated standing 1 minute x 3 trials with intermittent 1UE support and mod assist for balance while engaging in 2 simple and 1 moderately complex peg board designs. Pt requires moderate and max cuing as complexity increased and was given choice of pre-selected pieces. Pt was ultimately able to assemble all 3 designs with extra time and assist. Standing activity focused on R NMR, standing balance, weight bearing, scanning R of midline, and reaching for objects. Pt with R knee buckling and therapist blocks knee to prevent this; pt also weightbearing more through LLE. At end of session pt returned to bed in same manner noted above. Pt left in bed with alarm set & all needs within reach, telesitter in room.  Addendum: No behaviors demonstrating pain during session.  Therapy Documentation Precautions:  Precautions Precautions: Fall Precaution Comments: bone flap in L abdomen, no L side lying Restrictions Weight Bearing Restrictions: No   See Function Navigator for Current Functional Status.   Therapy/Group: Individual Therapy  Waunita Schooner 03/05/2018, 12:32 PM

## 2018-03-05 NOTE — Progress Notes (Signed)
Occupational Therapy Session Note  Patient Details  Name: Xavier Villegas MRN: [id number] Date of Birth: [birth year]  Today's Date: 03/05/2018 OT Individual Time: 1300-1400 OT Individual Time Calculation (min): 60 min    Short Term Goals: Week 1:  OT Short Term Goal 1 (Week 1): Pt will perform toilet transfer with mod A to decrease level of assist with self care. OT Short Term Goal 2 (Week 1): Pt will perform toilet hygiene with mod A for standing balance.  OT Short Term Goal 3 (Week 1): Pt will perform LB dressing with mod A.  Skilled Therapeutic Interventions/Progress Updates:    Pt received in bed with uncle in the room with him. Pt agreeable to therapy. Pt donned his helmet and sat to EOB with min A.  Pt initially worked on sitting balance with wt shifts for RUE wt bearing on the bed.  Scapular mobilization with stretching to decrease tone in RUE.  Pt completed stand pivot to L side with min A.  Pt transported to gym and completed squat pivot to R side with min A.  Pt tolerated unsupported sitting on mat for at least 40 min while working on R visual scanning, PROM to RUE with towel slides on elevated table, hand over hand A of picking up cones to stimulate R side awareness.  He also worked on sit to stands with min A and min A to stabilize balance with support through R leg.  Pt was not pushing to the R and was able to keep his balance in midline. Pt has difficulty communicating but seems to express that he has diplopia.  Uncle will try to bring in reading glasses to trial visual occlusion.  Pt transferred back to w/c and taken back to the room. His nurse tech obtained a chair alarm for pt.     Therapy Documentation Precautions:  Precautions Precautions: Fall Precaution Comments: bone flap in L abdomen, no L side lying Restrictions Weight Bearing Restrictions: No  Pain:   ADL:    See Function Navigator for Current Functional Status.   Therapy/Group: Individual  Therapy  [place] 03/05/2018, 12:22 PM

## 2018-03-05 NOTE — Progress Notes (Signed)
Orthopedic Tech Progress Note Patient Details:  Xavier Villegas 10/31/1980 [id number]  Patient ID: Xavier Villegas, male   DOB: 24-Oct-1980, 37 y.o.   MRN: [id number]   Xavier Villegas 03/05/2018, 11:26 AMCalled [facility] for Helmet, right Prafo and resting hand splint.

## 2018-03-05 NOTE — Progress Notes (Signed)
Speech Language Pathology Daily Session Note  Patient Details  Name: Xavier Villegas MRN: [id number] Date of Birth: 1981/07/20  Today's Date: 03/05/2018 SLP Individual Time: 67-1526 SLP Individual Time Calculation (min): 56 min  Short Term Goals: Week 1: SLP Short Term Goal 1 (Week 1): Pt will consume trials of dysphagia 2 and nectar thick liquids with min cues for use of swallowing precautions and minimal overt s/s of aspiration to demonstrate readiness for diet upgrade.  SLP Short Term Goal 1 - Progress (Week 1): Updated due to goal met SLP Short Term Goal 2 (Week 1): Pt will consume therapeutic trials of thin liquids with min cues for use of swallowing precautions and minimal overt s/s of aspiration.  SLP Short Term Goal 3 (Week 1): Pt will voice at the vocalic syllble level in 50% of opportunities with max assist multimodal cues.   SLP Short Term Goal 4 (Week 1): Pt will follow 1 step commands during basic, familiar tasks for >50% accuracy with mod assist multimodal cues.   SLP Short Term Goal 5 (Week 1): Pt will complete basic, familiar ADLs with mod assist for functional problem solving.   SLP Short Term Goal 6 (Week 1): Pt will locate items to the right of midline during basic, familiar tasks with min assist multimodal cues.    Skilled Therapeutic Interventions:Skilled ST services focused on swallow and cognitive skills. SLP facilitated PO consumption trials of dys 2, pt demonstrated oral clearance and no overt s/s aspiration, demonstrating readiness of meal trial. SLP facilitated expressive speech utilizing melodic common greeting/phrase, however pt was unable to imitate. SLP facilitated comprehension utilizing selection of objects from a field 5 demonstrating mod I. SLP facilitated scanning to right and identifying pictures by selection from a field of 3, when provided the function verbally, pt required supervision A question cues and given more abstract descriptors (which item is  sharp) pt required min A verbal cues/extra time. Pt required mod A fading to min A verbal cues to scan to right during functional tasks. Pt required mod A verbal cues to keep helmet on while sitting in chair. SLP facilitated oral care piror to trials of thin, pt demonstrated no overt s/s with ice chips and thin via tsp, with continued s/s aspiration on controlled cup sips 2/5. Given yes/no questions, pt indicated he would like to get in bed, SLP notified NT. Pt was left in room with call bell within reach and NT present. ST reccomends to continue skilled ST services.     Function:  Eating Eating   Modified Consistency Diet: Yes Eating Assist Level: Supervision or verbal cues;Helper checks for pocketed food;Help with picking up utensils           Cognition Comprehension Comprehension assist level: Understands basic 50 - 74% of the time/ requires cueing 25 - 49% of the time  Expression   Expression assist level: Expresses basic 25 - 49% of the time/requires cueing 50 - 75% of the time. Uses single words/gestures.  Social Interaction Social Interaction assist level: Interacts appropriately 25 - 49% of time - Needs frequent redirection.  Problem Solving Problem solving assist level: Solves basic 50 - 74% of the time/requires cueing 25 - 49% of the time  Memory Memory assist level: Recognizes or recalls 25 - 49% of the time/requires cueing 50 - 75% of the time    Pain Pain Assessment Pain Score: 0-No pain  Therapy/Group: Individual Therapy  Gionni Vaca  [facility] 03/05/2018, 3:29 PM

## 2018-03-05 NOTE — Progress Notes (Signed)
Subjective/Complaints: Patient seen sitting up in bed this morning.He is nonverbal.No reported issues overnight.  Review of systems cannot obtain secondary to aphasia  Objective: Vital Signs: Blood pressure (!) 131/94, pulse 84, temperature (!) 97.5 F (36.4 C), temperature source Oral, resp. rate 16, height 6' (1.829 m), weight 83 kg (182 lb 15.7 oz), SpO2 98 %. No results found. No results found for this or any previous visit (from the past 72 hour(s)).   HEENT: Left craniotomy incision with occlusive dressing c/D/I Cardio: RRR. No JVD. Resp: CTA bilaterally and unlabored GI: BS positive and nondistended Skin:   See above Neuro:  Limited due to aphasia Motor: lUE/LLE: 5/5 proximal distal R UE/RLE; 0/5 proximal distal, wiggles toes involuntarily MAS: Left elbow flexors 2/4, right ankle and plantar flexors 1/4 Musc/Skel:  No edema or tenderness in extremities General no acute distress. Well-developed   Assessment/Plan: 1. Functional deficits secondary to left MCA infarct with hemorrhagic transformation status post craniectomy which require 3+ hours per day of interdisciplinary therapy in a comprehensive inpatient rehab setting. Physiatrist is providing close team supervision and 24 hour management of active medical problems listed below. Physiatrist and rehab team continue to assess barriers to discharge/monitor patient progress toward functional and medical goals. FIM: Function - Bathing Position: Other (comment)(sitting in stedy) Body parts bathed by patient: Right arm, Chest, Abdomen, Front perineal area Body parts bathed by helper: Left arm, Buttocks, Right upper leg, Left upper leg, Right lower leg, Left lower leg, Back Assist Level: (max A)  Function- Upper Body Dressing/Undressing What is the patient wearing?: Hospital gown Assist Level: (total A) Function - Lower Body Dressing/Undressing What is the patient wearing?: Non-skid slipper socks Position: Bed Non-skid  slipper socks- Performed by helper: Don/doff right sock, Don/doff left sock Assist for footwear: Dependant Assist for lower body dressing: 2 Helpers  Function - Toileting Toileting activity did not occur: No continent bowel/bladder event Toileting steps completed by helper: Adjust clothing prior to toileting, Performs perineal hygiene, Adjust clothing after toileting Toileting Assistive Devices: Grab bar or rail Assist level: Two helpers  Function - Air cabin crew transfer activity did not occur: Safety/medical concerns Toilet transfer assistive device: Mechanical lift, Elevated toilet seat/BSC over toilet Mechanical lift: Stedy Assist level to toilet: Maximal assist (Pt 25 - 49%/lift and lower) Assist level from toilet: Maximal assist (Pt 25 - 49%/lift and lower)  Function - Chair/bed transfer Chair/bed transfer activity did not occur: Safety/medical concerns Chair/bed transfer method: Squat pivot Chair/bed transfer assist level: Moderate assist (Pt 50 - 74%/lift or lower) Chair/bed transfer assistive device: Armrests Chair/bed transfer details: Verbal cues for precautions/safety, Verbal cues for safe use of DME/AE, Verbal cues for technique, Manual facilitation for weight shifting  Function - Locomotion: Wheelchair Will patient use wheelchair at discharge?: (tbd) Type: Manual Max wheelchair distance: 150' Assist Level: Moderate assistance (Pt 50 - 74%) Assist Level: Moderate assistance (Pt 50 - 74%) Assist Level: Moderate assistance (Pt 50 - 74%) Turns around,maneuvers to table,bed, and toilet,negotiates 3% grade,maneuvers on rugs and over doorsills: No Function - Locomotion: Ambulation Ambulation activity did not occur: Safety/medical concerns Assistive device: Rail in hallway Max distance: 15 Assist level: 2 helpers(1 person providing max A and second helper w/c follow) Assist level: 2 helpers Walk 50 feet with 2 turns activity did not occur: Safety/medical  concerns Walk 150 feet activity did not occur: Safety/medical concerns Walk 10 feet on uneven surfaces activity did not occur: Safety/medical concerns  Function - Comprehension Comprehension: Auditory Comprehension assist level: Understands  basic 50 - 74% of the time/ requires cueing 25 - 49% of the time  Function - Expression Expression: Verbal Expression assist level: Expresses basic 25 - 49% of the time/requires cueing 50 - 75% of the time. Uses single words/gestures.  Function - Social Interaction Social Interaction assist level: Interacts appropriately 25 - 49% of time - Needs frequent redirection.  Function - Problem Solving Problem solving assist level: Solves basic 50 - 74% of the time/requires cueing 25 - 49% of the time  Function - Memory Memory assist level: Recognizes or recalls 25 - 49% of the time/requires cueing 50 - 75% of the time Patient normally able to recall (first 3 days only): That he or she is in a hospital   Medical Problem List and Plan: 1.  Right hemiparesis, dysphagia, and aphasia secondary to large left MCA infarct with hemorrhagic transformation s/p hemicraniectomy.  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  DVT Prophylaxis/Anticoagulation: Mechanical: Sequential compression devices, below knee Bilateral lower extremities 3. Pain Management: Tylenol as needed 4. Mood: Consult LCSW  for evaluation and support 5. Neuropsych: This patient is not fully capable of making decisions on his own behalf. 6. Skin/Wound Care: Routine pressure relief measures.  Monitor wound for healing.  Maintain adequate nutritional status. 7. Fluids/Electrolytes/Nutrition: Monitor I's/O's.   Encourage nectar thick fluid intake.   8. Dysphagia: Continue dysphagia 1, nectar liquids--offer fluids frequently. Off IVF- intake variable  9.  Leucocytosis: ?Progressive and felt to be reactive--ID recommends d/c antibiotic and monitor for signs of infection.   Mother with history of CLL.  WBCs 18.2 on 10/24  10. High homocysteine levels/low folate levels/boderline low B12: On high dose folic supplement, B6 and received B12 inj 7/14  . MRHFR--heterozygous /single mutation ID.  May cause abnormal clotting 12. GERD: Continue PPI.  13. Hyponatremia  Sodium 132 on 10/24  Labs ordered for Monday  LOS (Days) 4 A FACE TO FACE EVALUATION WAS PERFORMED  Boni Maclellan Lorie Phenix 03/05/2018, 10:52 AM

## 2018-03-06 ENCOUNTER — Inpatient Hospital Stay (HOSPITAL_COMMUNITY): Payer: BLUE CROSS/BLUE SHIELD | Admitting: Occupational Therapy

## 2018-03-06 NOTE — Progress Notes (Signed)
Occupational Therapy Session Note  Patient Details  Name: Xavier Villegas MRN: [id number] Date of Birth: 16-May-1981  Today's Date: 03/06/2018 OT Individual Time: 1020-1119 OT Individual Time Calculation (min): 59 min   Short Term Goals: Week 1:  OT Short Term Goal 1 (Week 1): Pt will perform toilet transfer with mod A to decrease level of assist with self care. OT Short Term Goal 2 (Week 1): Pt will perform toilet hygiene with mod A for standing balance.  OT Short Term Goal 3 (Week 1): Pt will perform LB dressing with mod A.  Skilled Therapeutic Interventions/Progress Updates:    Pt greeted supine in bed with family present. Started session with pt donning shorts and gripper socks in bed. He required vcs for hemi techniques and questioning cue for correct orientation of pants. Also donned helmet himself! Stand pivot<w/c towards Rt completed with Max A and Rt side supported. Stand pivot>elevated toilet completed in similar manner. During 1st attempt, pt with LOB towards Rt and fell to knees. OT assisted with slowing descent. Able to safely return to standing position with grab bar and Mod A for Rt side. RN in to assess vitals and mend small abrasion to Rt knee. He continued with tx without s/s increased pain or change in functional status. He had small BM on toilet after 2nd stand pivot transfer. During perihygiene and clothing mgt, worked on Rt weightbearing and midline orientation in standing. Father assisted with perihygiene while OT provided balance assist with Rt knee blocked. Afterwards grooming tasks completed w/c level at sink with facilitation of R UE weightbearing on sink. Provided pt with half lap tray and educated pt/father on gentle AAROM exercises for NMR/increasing attention to Rt. Encouraged father to propel pt outside of room today and draw attention to signs on Rt side, hallway calendar, and scenery outside of dayroom window for stimulation. Pt left with father, half lap tray,  and safety belt fastened.   Therapy Documentation Precautions:  Precautions Precautions: Fall Precaution Comments: bone flap in L abdomen, no L side lying Restrictions Weight Bearing Restrictions: No Pain: N s/s pain during tx   ADL:       See Function Navigator for Current Functional Status.   Therapy/Group: Individual Therapy  Latonia Conrow A Izaiyah Kleinman 03/06/2018, 12:34 PM

## 2018-03-06 NOTE — Progress Notes (Signed)
Subjective/Complaints: Patient seen lying in bed this morning. He slept well for the most part for sleep chart. No reported issues overnight.  Review of systems unable to obtain due to aphasia  Objective: Vital Signs: Blood pressure (!) 127/94, pulse 84, temperature 97.6 F (36.4 C), temperature source Oral, resp. rate 18, height 6' (1.829 m), weight 83 kg (182 lb 15.7 oz), SpO2 97 %. No results found. No results found for this or any previous visit (from the past 72 hour(s)).   HEENT: Left craniotomy incision with occlusive dressing C/D/I Cardio: RRR. No JVD. Resp: CTA bilaterally. Unlabored GI: BS positive and nondistended Skin:   See above Neuro:  Limited due to aphasia Motor: lUE/LLE: 5/5 proximal distal RUE/RLE; 0/5 proximal distal (unchanged) MAS: Left elbow flexors 2/4, right ankle and plantar flexors 1/4 Musc/Skel:  No edema or tenderness in extremities General no acute distress. Well-developed   Assessment/Plan: 1. Functional deficits secondary to left MCA infarct with hemorrhagic transformation status post craniectomy which require 3+ hours per day of interdisciplinary therapy in a comprehensive inpatient rehab setting. Physiatrist is providing close team supervision and 24 hour management of active medical problems listed below. Physiatrist and rehab team continue to assess barriers to discharge/monitor patient progress toward functional and medical goals. FIM: Function - Bathing Position: Other (comment)(sitting in stedy) Body parts bathed by patient: Right arm, Chest, Abdomen, Front perineal area Body parts bathed by helper: Left arm, Buttocks, Right upper leg, Left upper leg, Right lower leg, Left lower leg, Back Assist Level: (max A)  Function- Upper Body Dressing/Undressing What is the patient wearing?: Hospital gown Assist Level: (total A) Function - Lower Body Dressing/Undressing What is the patient wearing?: Non-skid slipper socks Position: Bed Non-skid  slipper socks- Performed by helper: Don/doff right sock, Don/doff left sock Assist for footwear: Dependant Assist for lower body dressing: 2 Helpers  Function - Toileting Toileting activity did not occur: No continent bowel/bladder event Toileting steps completed by helper: Adjust clothing prior to toileting, Performs perineal hygiene, Adjust clothing after toileting Toileting Assistive Devices: Grab bar or rail Assist level: Two helpers  Function - Air cabin crew transfer activity did not occur: Safety/medical concerns Toilet transfer assistive device: Mechanical lift, Elevated toilet seat/BSC over toilet Mechanical lift: Stedy Assist level to toilet: Maximal assist (Pt 25 - 49%/lift and lower) Assist level from toilet: Maximal assist (Pt 25 - 49%/lift and lower)  Function - Chair/bed transfer Chair/bed transfer activity did not occur: Safety/medical concerns Chair/bed transfer method: Stand pivot Chair/bed transfer assist level: Moderate assist (Pt 50 - 74%/lift or lower) Chair/bed transfer assistive device: Armrests Chair/bed transfer details: Verbal cues for technique, Verbal cues for precautions/safety, Manual facilitation for weight shifting, Manual facilitation for placement  Function - Locomotion: Wheelchair Will patient use wheelchair at discharge?: (tbd) Type: Manual Max wheelchair distance: 150 ft Assist Level: Touching or steadying assistance (Pt > 75%) Assist Level: Touching or steadying assistance (Pt > 75%) Assist Level: Touching or steadying assistance (Pt > 75%) Turns around,maneuvers to table,bed, and toilet,negotiates 3% grade,maneuvers on rugs and over doorsills: No Function - Locomotion: Ambulation Ambulation activity did not occur: Safety/medical concerns Assistive device: Rail in hallway Max distance: 15 Assist level: 2 helpers(1 person providing max A and second helper w/c follow) Assist level: 2 helpers Walk 50 feet with 2 turns activity did not  occur: Safety/medical concerns Walk 150 feet activity did not occur: Safety/medical concerns Walk 10 feet on uneven surfaces activity did not occur: Safety/medical concerns  Function - Comprehension Comprehension:  Auditory Comprehension assist level: Understands basic 50 - 74% of the time/ requires cueing 25 - 49% of the time  Function - Expression Expression: Nonverbal Expression assist level: Expresses basis less than 25% of the time/requires cueing >75% of the time.  Function - Social Interaction Social Interaction assist level: Interacts appropriately 25 - 49% of time - Needs frequent redirection.  Function - Problem Solving Problem solving assist level: Solves basic 50 - 74% of the time/requires cueing 25 - 49% of the time  Function - Memory Memory assist level: Recognizes or recalls 25 - 49% of the time/requires cueing 50 - 75% of the time Patient normally able to recall (first 3 days only): That he or she is in a hospital   Medical Problem List and Plan: 1.  Right hemiparesis, dysphagia, and aphasia secondary to large left MCA infarct with hemorrhagic transformation s/p hemicraniectomy.  Continue CIR  Helmet ordered 2.  DVT Prophylaxis/Anticoagulation: Mechanical: Sequential compression devices, below knee Bilateral lower extremities 3. Pain Management: Tylenol as needed 4. Mood: Consult LCSW  for evaluation and support 5. Neuropsych: This patient is not fully capable of making decisions on his own behalf. 6. Skin/Wound Care: Routine pressure relief measures.  Monitor wound for healing.  Maintain adequate nutritional status. 7. Fluids/Electrolytes/Nutrition: Monitor I's/O's.   Encourage nectar thick fluid intake.   8. Dysphagia: Continue dysphagia 1, nectar liquids--offer fluids frequently. Off IVF- intake variable  9. Leucocytosis: ?Progressive and felt to be reactive--ID recommends d/c antibiotic and monitor for signs of infection.   Mother with history of CLL.  WBCs 18.2  on 7/24  Labs for tomorrow  10. High homocysteine levels/low folate levels/boderline low B12: On high dose folic supplement, B6 and received B12 inj 7/14  . MRHFR--heterozygous /single mutation ID.  May cause abnormal clotting 11. GERD: Continue PPI.  12. Hyponatremia  Sodium 132 on 7/24  Labs ordered for tomorrow  LOS (Days) 5 A FACE TO FACE EVALUATION WAS PERFORMED  Kaileen Bronkema Lorie Phenix 03/06/2018, 7:34 AM

## 2018-03-06 NOTE — Progress Notes (Signed)
   03/06/18 1045  What Happened  Was fall witnessed? Yes  Who witnessed fall? Lucila Maine, OT  Patients activity before fall during therapy  Point of contact other (comment) (knee)  Was patient injured? Yes (mild scrape to right knee)  Follow Up  MD notified Posey Pronto  Time MD notified 1311  Family notified Yes-comment (family present during therapy session)  Time family notified 1045  Additional tests No  Simple treatment Other (comment) (bandaid to skin tear)  Adult Fall Risk Assessment  Risk Factor Category (scoring not indicated) High fall risk per protocol (document High fall risk)  Patient's Fall Risk High Fall Risk (>13 points)  Adult Fall Risk Interventions  Required Bundle Interventions *See Row Information* High fall risk - low, moderate, and high requirements implemented  Additional Interventions Camera surveillance (with patient/family notification & education);Room near nurses station;Lap belt while in chair/wheelchair;Family Supervision  Screening for Fall Injury Risk (To be completed on HIGH fall risk patients) - Assessing Need for Low Bed  Risk For Fall Injury- Low Bed Criteria Admitted as a result of a fall  Will Implement Low Bed and Floor Mats No - Criteria no longer met for low bed  Screening for Fall Injury Risk (To be completed on HIGH fall risk patients who do not meet crieteria for Low Bed) - Assessing Need for Floor Mats Only  Risk For Fall Injury- Criteria for Floor Mats Confusion/dementia (+CAM, CIWA, TBI, etc.)  Will Implement Floor Mats Yes  Vitals  Pulse Rate (!) 118  Pulse Rate Source Apical  New onset of dysrhythmia? No  Resp 18 (no distress)  Pain Assessment  Pain Scale 0-10  Pain Score 0  Neurological  Neuro (WDL) X  Level of Consciousness Alert  Orientation Level Oriented to person;Other (comment) (nods appropriately 50% of time)  Cognition Follows commands;Other (Comment) (delayed resposes)  Speech Expressive aphasia;Delayed responses   R Hand Grip Absent  L Hand Grip Strong  R Foot Dorsiflexion Absent  L Foot Dorsiflexion Moderate  R Foot Plantar Flexion Absent  L Foot Plantar Flexion Moderate  RUE Motor Response Non-purposeful movment  RUE Sensation Tingling  RUE Motor Strength 0  LUE Motor Response Purposeful movement  LUE Sensation Full sensation  LUE Motor Strength 5  RLE Motor Response Non-purposeful movement  RLE Sensation Tingling  RLE Motor Strength 1  LLE Motor Response Purposeful movement  LLE Sensation Full sensation  LLE Motor Strength 4  Neuro Symptoms None  Musculoskeletal  Musculoskeletal (WDL) X  Assistive Device Stedy;Helmet;Wheelchair  Generalized Weakness Yes  Weight Bearing Restrictions No  Integumentary  Integumentary (WDL) X  Skin Color Appropriate for ethnicity  Skin Condition Dry  Skin Integrity Abrasion  Abrasion Location Knee  Abrasion Location Orientation Right  Abrasion Intervention Cleansed;Other (Comment) (bandaid)

## 2018-03-07 ENCOUNTER — Inpatient Hospital Stay (HOSPITAL_COMMUNITY): Payer: BLUE CROSS/BLUE SHIELD | Admitting: Occupational Therapy

## 2018-03-07 ENCOUNTER — Ambulatory Visit (HOSPITAL_COMMUNITY): Payer: BLUE CROSS/BLUE SHIELD

## 2018-03-07 ENCOUNTER — Inpatient Hospital Stay (HOSPITAL_COMMUNITY): Payer: BLUE CROSS/BLUE SHIELD | Admitting: Physical Therapy

## 2018-03-07 LAB — CBC
HCT: 46.9 % (ref 39.0–52.0)
HEMOGLOBIN: 16.4 g/dL (ref 13.0–17.0)
MCH: 36.7 pg — AB (ref 26.0–34.0)
MCHC: 35 g/dL (ref 30.0–36.0)
MCV: 104.9 fL — AB (ref 78.0–100.0)
Platelets: 527 10*3/uL — ABNORMAL HIGH (ref 150–400)
RBC: 4.47 MIL/uL (ref 4.22–5.81)
RDW: 12.3 % (ref 11.5–15.5)
WBC: 15 10*3/uL — ABNORMAL HIGH (ref 4.0–10.5)

## 2018-03-07 LAB — BASIC METABOLIC PANEL
ANION GAP: 12 (ref 5–15)
BUN: 20 mg/dL (ref 6–20)
CHLORIDE: 101 mmol/L (ref 98–111)
CO2: 25 mmol/L (ref 22–32)
Calcium: 9.9 mg/dL (ref 8.9–10.3)
Creatinine, Ser: 0.68 mg/dL (ref 0.61–1.24)
GFR calc non Af Amer: >60 mL/min (ref >60)
Glucose, Bld: 104 mg/dL — ABNORMAL HIGH (ref 70–99)
POTASSIUM: 4.3 mmol/L (ref 3.5–5.1)
Sodium: 138 mmol/L (ref 135–145)

## 2018-03-07 MED ORDER — MUSCLE RUB 10-15 % EX CREA
TOPICAL_CREAM | Freq: Two times a day (BID) | CUTANEOUS | Status: DC
  Administered 2018-03-07 – 2018-03-08 (×3): via TOPICAL
  Administered 2018-03-09: 1 via TOPICAL
  Administered 2018-03-11 – 2018-04-03 (×12): via TOPICAL
  Filled 2018-03-07: qty 85

## 2018-03-07 MED ORDER — PREMIER PROTEIN SHAKE
11.0000 [oz_av] | Freq: Three times a day (TID) | ORAL | Status: DC
  Administered 2018-03-07 – 2018-04-03 (×68): 11 [oz_av] via ORAL
  Filled 2018-03-07 (×95): qty 325.31

## 2018-03-07 MED ORDER — CYCLOBENZAPRINE HCL 5 MG PO TABS
5.0000 mg | ORAL_TABLET | Freq: Once | ORAL | Status: AC
  Administered 2018-03-07: 5 mg via ORAL
  Filled 2018-03-07: qty 1

## 2018-03-07 NOTE — Progress Notes (Signed)
Pt appears more comfortable. Will do VS q4h for 24h as ordered. Pt was incontinent of stool. Cleansed and cream applied to perirectal area. Will monitor pt throughout night. Pt drank 244ml or NTL .

## 2018-03-07 NOTE — Significant Event (Signed)
Rapid Response Event Note  Overview: Time Called: 1943 Arrival Time: 1950 Event Type: Neurologic  Initial Focused Assessment: On arrival pt lying supine in bed. Having episodes of all over spasms. Right bicep seems to be bothering pt, bicep is tight. He reports pressure to suprapubic region. <68 on bladder scanner, recent in/out cath yield >200 output. Pt has been nonverbal since his stroke 02/19/2018. BP 152/98, HR 136. WBC trending down, afebrile, pt not taking in a lot of PO fluids.   BP 131/94, HR 108 Interventions: RN Collie Siad to give Trazadone 50 mg, Tylenol 650 mg  Plan of Care (if not transferred): Continue to monitor, RN to call for any concerns.  Event Summary: Name of Physician Notified: Zella Ball  at [place]    at    Outcome: Stayed in room and stabalized     [place], [place]

## 2018-03-07 NOTE — Progress Notes (Signed)
Occupational Therapy Session Note  Patient Details  Name: Xavier Villegas MRN: [id number] Date of Birth: [birth year]  Today's Date: 03/07/2018 OT Individual Time: 2633-3545 OT Individual Time Calculation (min): 71 min   Short Term Goals: Week 1:  OT Short Term Goal 1 (Week 1): Pt will perform toilet transfer with mod A to decrease level of assist with self care. OT Short Term Goal 2 (Week 1): Pt will perform toilet hygiene with mod A for standing balance.  OT Short Term Goal 3 (Week 1): Pt will perform LB dressing with mod A.  Skilled Therapeutic Interventions/Progress Updates:    Pt seen in bed, gesturing towards stomach when asked if he had pain. RN in to provide pain medicine during session. He first donned socks and shoes while supine with questioning cues for Rt/Lt orientation for sneakers. Assist required for tying laces. He picked out the clothes he wanted to wear today with setup of duffel bag. He donned helmet himself also. Afterwards pt completed stand pivot to w/c towards Lt with Mod A for supporting Rt side. While washing up w/c level at sink, pt with increase in R UE hypertonicity. Pt grimacing and holding on to limb at times. OT provided gentle ROM and sensory input to limb to break up tone. He achieved figure 4 position with steady assist while OT washed feet. Able to doff/don helmet with cuing throughout. Sit<stand with Mod A at sink with 2nd helper assisting with changing soiled brief and perihygiene. After pt assisted with pulling up pants and sat down, he closed his eyes and began breathing heavily. When asked if he was all right, pt shook his head. BP assessed, 144/131. Provided him with cool wash cloth to face, and reassessed over the next few minutes with readings of 143/108 and 139/106. Used Stedy for transfer back to bed. Vitals reassessed once he was supine and comfortable. Reading was 139/104. RN made aware. Pt left with all needs within reach, bed alarm set, and aunt  present.   Therapy Documentation Precautions:  Precautions Precautions: Fall Precaution Comments: bone flap in L abdomen, no L side lying Restrictions Weight Bearing Restrictions: No Vital Signs: Therapy Vitals Pulse Rate: (!) 111 Resp: 18 BP: (!) 149/109 Patient Position (if appropriate): Sitting Oxygen Therapy SpO2: 97 % O2 Device: Room Air Pain: Pain Assessment Pain Scale: Faces Faces Pain Scale: Hurts even more Pain Type: Acute pain Pain Location: Generalized Pain Descriptors / Indicators: Aching Pain Frequency: Intermittent Pain Intervention(s): Medication (See eMAR) ADL:    See Function Navigator for Current Functional Status.   Therapy/Group: Individual Therapy  Keyonte Cookston A Gennavieve Huq 03/07/2018, 12:35 PM

## 2018-03-07 NOTE — Progress Notes (Signed)
Speech Language Pathology Daily Session Note  Patient Details  Name: Xavier Villegas MRN: [id number] Date of Birth: 1981-03-25  Today's Date: 03/07/2018 SLP Individual Time: 0802-0900 SLP Individual Time Calculation (min): 58 min  Short Term Goals: Week 1: SLP Short Term Goal 1 (Week 1): Pt will consume dysphagia 2 and nectar thick liquids with min cues for use of swallowing precautions and minimal overt s/s of aspiration with min A verbal cues for swallow strategies. SLP Short Term Goal 1 - Progress (Week 1): Updated due to goal met SLP Short Term Goal 2 (Week 1): Pt will consume therapeutic trials of thin liquids with min cues for use of swallowing precautions and minimal overt s/s of aspiration.  SLP Short Term Goal 3 (Week 1): Pt will voice at the vocalic syllble level in 50% of opportunities with max assist multimodal cues.   SLP Short Term Goal 4 (Week 1): Pt will follow 1 step commands during basic, familiar tasks for >50% accuracy with mod assist multimodal cues.   SLP Short Term Goal 5 (Week 1): Pt will complete basic, familiar ADLs with mod assist for functional problem solving.   SLP Short Term Goal 6 (Week 1): Pt will locate items to the right of midline during basic, familiar tasks with min assist multimodal cues.    Skilled Therapeutic Interventions:Skilled ST services focused on swallow and cognitive skills. Pt demonstrated ability to follow 1 step commands repositioning in bed with mod A verbal cues. SLP facilitated PO consumption of dys 2 breakfast meal tray, pt demonstrated right aniertor spillage with continued reduce sensation, however appropriate oral clearance. SLP upgraded diet to dys 2 with continued full supervision. SLP facilitated comprehension of yes/no question, given biographical and immediate environment pt demonstrated clearly with 50% accuracy and with familiar communicator 80% accuracy given max A verbal cues of clarifying questions, pt utilizes nonverbal  gestures, a clear "yes" with nod, however "no" required extra nonverbal cues including facial grimace and hand movements. SLP facilitated communication with yes/no questions and max A verbal cues while placing desired meal order for lunch, dinner and breakfast for the following day. SLP facilitated expressive speech utilizing singing in unison, however pt was unable to to hum or vocalize. Pt did verbal express during session appropriately "alright", "okay" and "no"x2. Pt was left in room with call bell within reach and bed alarm set. Recommend to continue skilled ST services.      Function:  Eating Eating   Modified Consistency Diet: Yes(dys 2) Eating Assist Level: Supervision or verbal cues;Helper checks for pocketed food;Help with picking up utensils           Cognition Comprehension Comprehension assist level: Understands basic 50 - 74% of the time/ requires cueing 25 - 49% of the time  Expression   Expression assist level: Expresses basic 25 - 49% of the time/requires cueing 50 - 75% of the time. Uses single words/gestures.;Expresses basis less than 25% of the time/requires cueing >75% of the time.  Social Interaction Social Interaction assist level: Interacts appropriately 25 - 49% of time - Needs frequent redirection.  Problem Solving Problem solving assist level: Solves basic 50 - 74% of the time/requires cueing 25 - 49% of the time  Memory Memory assist level: Recognizes or recalls 25 - 49% of the time/requires cueing 50 - 75% of the time    Pain Pain Assessment Pain Score: 0-No pain  Therapy/Group: Individual Therapy  Mykell Rawl  [facility] 03/07/2018, 2:58 PM

## 2018-03-07 NOTE — Progress Notes (Signed)
Patient fatigued today with elevated BP and heart rate. PA made aware. Instructed to alternate rest and sitting up in the chair and push fluids. Extra fluids offered and patient kept sitting for meals and therapy with nap breaks. Patient in no distress. Will report to oncoming staff.

## 2018-03-07 NOTE — Progress Notes (Signed)
Pt is tachycardic, hypertensive with mild diaphoresis. Pt is complaining of muscle tightness is BLE and RUE. When asked pt stated he had slight headache. Pt is tender in the suprapubic region. Rapid Nurse asked to come by to see patient. Danella Sensing PA also called.

## 2018-03-07 NOTE — Progress Notes (Signed)
Physical Therapy Session Note  Patient Details  Name: Xavier Villegas MRN: [id number] Date of Birth: 1980-12-28  Today's Date: 03/07/2018 PT Individual Time: 1057-1140 PT Individual Time Calculation (min): 43 min   Short Term Goals: Week 1:  PT Short Term Goal 1 (Week 1): Pt will scan to R environment in 50% of opportunities with min cues PT Short Term Goal 2 (Week 1): Pt will ambulate 30' with +1 assist PT Short Term Goal 3 (Week 1): Pt will initiate stair training with PT PT Short Term Goal 4 (Week 1): Pt will demo dynamic sitting balance with consistent min assist PT Short Term Goal 5 (Week 1): Pt will propel w/c 50' with min assist  Skilled Therapeutic Interventions/Progress Updates:    Pt gestured with L UE mild pain to head prior to session onset when questioned by therapist. PT assessed BP in supine= 145/100 with nursing staff and PA notified of his DBP inc. Pt performed supine>sitting EOB with min A for trunk and verbal cues to scoot forward when sitting. Pt able to don helmet in sitting with min A for strapping. Stand pivot transfer to his L was mod A. BP reassessed in sitting to be 137/105. PT initiated therapy sitting in w/c working on cognition and dual task while assessing BP and symptoms throughout treatment duration. Pt able to rank 5 cards from least>highest number with minimal challenge and no cueing. PT initiated more challenging card activity incorporating problem solving, R visual field scanning, and turn taking. PT assessed Pt right vision by having him cover his L eye and Pt unable to track with R eye or verbalize or gesture any ability to see therapist hand displaying numbers with fingers. BP reassessed to be 134/101. Pt then sorted game pieces by color into field of 8 with inability to finish due to increased head pain with Pt gesturing he wanted to close his eyes and take a break. BP reassessed to be 133/113. Pt immediately brought back to his room and pulse recorded  to be 112 BPM. PA notified of Pt status and nursing reported they will check on him. Pt left sitting with chair alarm activated per PA instruction, call bell in reach, all needs met with Aunt present. Pt missed 17 min due to increase BP.  Therapy Documentation Precautions:  Precautions Precautions: Fall Precaution Comments: bone flap in L abdomen, no L side lying Restrictions Weight Bearing Restrictions: No   See Function Navigator for Current Functional Status.   Therapy/Group: Individual Therapy  Floreen Comber 03/07/2018, 11:57 AM

## 2018-03-07 NOTE — Progress Notes (Addendum)
Upon receiving report and rounding with prior RN, Patient was noted to be medically unstable. I notified my CN for additional assessment and evaluation of the patient. Rapid Response and on call PA was also notified. As the patient's condition clinically advanced the patient ws closely monitor by the RN and clinical team. Family at the bedside and updated by assigned CN and Rapid Response, emotional support.

## 2018-03-07 NOTE — Progress Notes (Signed)
Subjective/Complaints: Pt in bed. No new complaints.   ROS: limited due to language/communication    Objective: Vital Signs: Blood pressure (!) 126/92, pulse (!) 108, temperature 97.7 F (36.5 C), temperature source Oral, resp. rate 18, height 6' (1.829 m), weight 83 kg (182 lb 15.7 oz), SpO2 97 %. No results found. Results for orders placed or performed during the hospital encounter of 03/01/18 (from the past 72 hour(s))  Basic metabolic panel     Status: Abnormal   Collection Time: 03/07/18  4:50 AM  Result Value Ref Range   Sodium 138 135 - 145 mmol/L   Potassium 4.3 3.5 - 5.1 mmol/L   Chloride 101 98 - 111 mmol/L   CO2 25 22 - 32 mmol/L   Glucose, Bld 104 (H) 70 - 99 mg/dL   BUN 20 6 - 20 mg/dL   Creatinine, Ser 0.68 0.61 - 1.24 mg/dL   Calcium 9.9 8.9 - 10.3 mg/dL   GFR calc non Af Amer >60 >60 mL/min   GFR calc Af Amer >60 >60 mL/min    Comment: (NOTE) The eGFR has been calculated using the CKD EPI equation. This calculation has not been validated in all clinical situations. eGFR's persistently <60 mL/min signify possible Chronic Kidney Disease.    Anion gap 12 5 - 15    Comment: Performed at [facility] [address], [place], [place] [address]  CBC     Status: Abnormal   Collection Time: 03/07/18  4:50 AM  Result Value Ref Range   WBC 15.0 (H) 4.0 - 10.5 K/uL   RBC 4.47 4.22 - 5.81 MIL/uL   Hemoglobin 16.4 13.0 - 17.0 g/dL   HCT 46.9 39.0 - 52.0 %   MCV 104.9 (H) 78.0 - 100.0 fL   MCH 36.7 (H) 26.0 - 34.0 pg   MCHC 35.0 30.0 - 36.0 g/dL   RDW 12.3 11.5 - 15.5 %   Platelets 527 (H) 150 - 400 K/uL    Comment: Performed at [facility], [place] [address] [place], [place] [address]     Constitutional: No distress . Vital signs reviewed. HEENT: EOMI, oral membranes moist. Crani site intact Neck: supple Cardiovascular: RRR without murmur. No JVD    Respiratory: CTA Bilaterally without wheezes or rales. Normal effort    GI: BS +, non-tender,  non-distended  Neuro:  Limited due to aphasia Motor: lUE/LLE: 5/5 proximal distal RUE/RLE; 0/5 proximal distal (unchanged) MAS: Left elbow flexors 2/4, right ankle and plantar flexors 1/4 Musc/Skel:  No edema or tenderness in extremities Psych: pleasant and cooperative   Assessment/Plan: 1. Functional deficits secondary to left MCA infarct with hemorrhagic transformation status post craniectomy which require 3+ hours per day of interdisciplinary therapy in a comprehensive inpatient rehab setting. Physiatrist is providing close team supervision and 24 hour management of active medical problems listed below. Physiatrist and rehab team continue to assess barriers to discharge/monitor patient progress toward functional and medical goals. FIM: Function - Bathing Position: Wheelchair/chair at sink Body parts bathed by patient: Right arm, Chest, Abdomen, Front perineal area Body parts bathed by helper: Left arm, Buttocks, Right upper leg, Left upper leg, Right lower leg, Left lower leg, Back Assist Level: 2 helpers  Function- Upper Body Dressing/Undressing What is the patient wearing?: Pull over shirt/dress Pull over shirt/dress - Perfomed by patient: Thread/unthread left sleeve, Put head through opening, Pull shirt over trunk Pull over shirt/dress - Perfomed by helper: Thread/unthread right sleeve Assist Level: Touching or steadying assistance(Pt >  75%) Function - Lower Body Dressing/Undressing What is the patient wearing?: Pants, Socks, Shoes Position: Wheelchair/chair at sink Pants- Performed by patient: Thread/unthread right pants leg, Thread/unthread left pants leg, Pull pants up/down Pants- Performed by helper: Thread/unthread right pants leg, Thread/unthread left pants leg, Pull pants up/down Non-skid slipper socks- Performed by patient: Don/doff right sock, Don/doff left sock Non-skid slipper socks- Performed by helper: Don/doff right sock, Don/doff left sock Socks - Performed by  patient: Don/doff right sock, Don/doff left sock(supine in bed) Shoes - Performed by patient: Don/doff right shoe, Don/doff left shoe(supine in bed) Shoes - Performed by helper: Fasten right, Fasten left Assist for footwear: Dependant Assist for lower body dressing: (Max A for dynamic standing balance)  Function - Toileting Toileting activity did not occur: No continent bowel/bladder event Toileting steps completed by helper: Adjust clothing prior to toileting, Performs perineal hygiene, Adjust clothing after toileting Toileting Assistive Devices: Grab bar or rail Assist level: Two helpers  Function - Air cabin crew transfer activity did not occur: Safety/medical concerns Toilet transfer assistive device: Radio broadcast assistant lift: Stedy Assist level to toilet: Maximal assist (Pt 25 - 49%/lift and lower) Assist level from toilet: 2 helpers  Function - Chair/bed transfer Chair/bed transfer activity did not occur: Safety/medical concerns Chair/bed transfer method: Stand pivot Chair/bed transfer assist level: Moderate assist (Pt 50 - 74%/lift or lower) Chair/bed transfer assistive device: Armrests Chair/bed transfer details: Verbal cues for technique, Verbal cues for precautions/safety, Manual facilitation for weight shifting, Manual facilitation for placement  Function - Locomotion: Wheelchair Will patient use wheelchair at discharge?: (tbd) Type: Manual Max wheelchair distance: 150 ft Assist Level: Touching or steadying assistance (Pt > 75%) Assist Level: Touching or steadying assistance (Pt > 75%) Assist Level: Touching or steadying assistance (Pt > 75%) Turns around,maneuvers to table,bed, and toilet,negotiates 3% grade,maneuvers on rugs and over doorsills: No Function - Locomotion: Ambulation Ambulation activity did not occur: Safety/medical concerns Assistive device: Rail in hallway Max distance: 15 Assist level: 2 helpers(1 person providing max A and second helper  w/c follow) Assist level: 2 helpers Walk 50 feet with 2 turns activity did not occur: Safety/medical concerns Walk 150 feet activity did not occur: Safety/medical concerns Walk 10 feet on uneven surfaces activity did not occur: Safety/medical concerns  Function - Comprehension Comprehension: Auditory Comprehension assist level: Understands basic 50 - 74% of the time/ requires cueing 25 - 49% of the time  Function - Expression Expression: Verbal Expression assist level: Expresses basic 25 - 49% of the time/requires cueing 50 - 75% of the time. Uses single words/gestures., Expresses basis less than 25% of the time/requires cueing >75% of the time.  Function - Social Interaction Social Interaction assist level: Interacts appropriately 25 - 49% of time - Needs frequent redirection.  Function - Problem Solving Problem solving assist level: Solves basic 50 - 74% of the time/requires cueing 25 - 49% of the time  Function - Memory Memory assist level: Recognizes or recalls 25 - 49% of the time/requires cueing 50 - 75% of the time Patient normally able to recall (first 3 days only): That he or she is in a hospital   Medical Problem List and Plan: 1.  Right hemiparesis, dysphagia, and aphasia secondary to large left MCA infarct with hemorrhagic transformation s/p hemicraniectomy.  Continue CIR  Helmet  2.  DVT Prophylaxis/Anticoagulation: Mechanical: Sequential compression devices, below knee Bilateral lower extremities 3. Pain Management: Tylenol as needed 4. Mood: Consult LCSW  for evaluation and support 5. Neuropsych: This  patient is not fully capable of making decisions on his own behalf. 6. Skin/Wound Care: Routine pressure relief measures.  Monitor wound for healing.  Maintain adequate nutritional status. 7. Fluids/Electrolytes/Nutrition: Monitor I's/O's.   Encourage nectar thick fluid intake.   8. Dysphagia: Continue dysphagia 1, nectar liquids--offer fluids frequently. Off IVF-  intake variable  9. Leucocytosis: ?Progressive and felt to be reactive--ID recommends d/c antibiotic and monitor for signs of infection.   Mother with history of CLL.  WBCs 15 7/29    10. High homocysteine levels/low folate levels/boderline low B12: On high dose folic supplement, B6 and received B12 inj 7/14  . MRHFR--heterozygous /single mutation ID.  May cause abnormal clotting 11. GERD: Continue PPI.  12. Hyponatremia  Sodium 138 on 7/29  I personally reviewed the patient's labs today.    LOS (Days) 6 A FACE TO FACE EVALUATION WAS PERFORMED  Meredith Staggers 03/07/2018, 6:08 PM

## 2018-03-08 ENCOUNTER — Inpatient Hospital Stay (HOSPITAL_COMMUNITY): Payer: BLUE CROSS/BLUE SHIELD | Admitting: Occupational Therapy

## 2018-03-08 ENCOUNTER — Inpatient Hospital Stay (HOSPITAL_COMMUNITY): Payer: BLUE CROSS/BLUE SHIELD | Admitting: Speech Pathology

## 2018-03-08 ENCOUNTER — Inpatient Hospital Stay (HOSPITAL_COMMUNITY): Payer: BLUE CROSS/BLUE SHIELD

## 2018-03-08 ENCOUNTER — Inpatient Hospital Stay (HOSPITAL_COMMUNITY): Payer: BLUE CROSS/BLUE SHIELD | Admitting: Physical Therapy

## 2018-03-08 ENCOUNTER — Ambulatory Visit (HOSPITAL_COMMUNITY): Payer: BLUE CROSS/BLUE SHIELD

## 2018-03-08 LAB — URINALYSIS, ROUTINE W REFLEX MICROSCOPIC
BILIRUBIN URINE: NEGATIVE
Bacteria, UA: NONE SEEN
Glucose, UA: NEGATIVE mg/dL
KETONES UR: NEGATIVE mg/dL
Leukocytes, UA: NEGATIVE
NITRITE: NEGATIVE
Protein, ur: NEGATIVE mg/dL
SPECIFIC GRAVITY, URINE: 1.012 (ref 1.005–1.030)
pH: 5 (ref 5.0–8.0)

## 2018-03-08 LAB — MAGNESIUM: MAGNESIUM: 1.8 mg/dL (ref 1.7–2.4)

## 2018-03-08 MED ORDER — BACLOFEN 5 MG HALF TABLET
5.0000 mg | ORAL_TABLET | Freq: Three times a day (TID) | ORAL | Status: DC
  Administered 2018-03-08 – 2018-03-11 (×9): 5 mg via ORAL
  Filled 2018-03-08 (×9): qty 1

## 2018-03-08 NOTE — Progress Notes (Signed)
Speech Language Pathology Daily Session Note  Patient Details  Name: Xavier Villegas MRN: [id number] Date of Birth: 14-Aug-1980  Today's Date: 03/08/2018 SLP Individual Time: 0803-0900 SLP Individual Time Calculation (min): 57 min  Short Term Goals: Week 1: SLP Short Term Goal 1 (Week 1): Pt will consume dysphagia 2 and nectar thick liquids with min cues for use of swallowing precautions and minimal overt s/s of aspiration with min A verbal cues for swallow strategies. SLP Short Term Goal 1 - Progress (Week 1): Updated due to goal met SLP Short Term Goal 2 (Week 1): Pt will consume therapeutic trials of thin liquids with min cues for use of swallowing precautions and minimal overt s/s of aspiration.  SLP Short Term Goal 3 (Week 1): Pt will voice at the vocalic syllble level in 50% of opportunities with max assist multimodal cues.   SLP Short Term Goal 4 (Week 1): Pt will follow 1 step commands during basic, familiar tasks for >50% accuracy with mod assist multimodal cues.   SLP Short Term Goal 5 (Week 1): Pt will complete basic, familiar ADLs with mod assist for functional problem solving.   SLP Short Term Goal 6 (Week 1): Pt will locate items to the right of midline during basic, familiar tasks with min assist multimodal cues.    Skilled Therapeutic Interventions:Skilled ST services focused on swallow skills, speech skills and family education. Pt's aunt was present upon entering and informed SLP that she gave pt thin water and the doctor told her it was not appropriate. SLP provided education of pt's current swallow function and swallow restrictions, family member sated understanding and left session. Pt demonstrated ability to follow 1 step directions in repositioning in bed with min-supervision A verbal cues. SLP facilitated PO consumption of dys 2 and trials of dys 3 with oral clearance and on overt s/s aspiration. SLP facilitated response to yes/no questions, pt responded nonverbally,  pertaining to biographical/immediate environment pt demonstrated Mod I and in expression of want/needs placing meal orders 90% accuracy with mod A verbal cues. Pt attempted to vocalize at phrase level, however unintelligible. SLP left note for family to bring in pictures of family, friend and familiar places with labels on backside to aid in expressive communication and yes/no response. Pt was left in room with call bell within reach and bed alaram set. ST reccomends to continue skilled ST services.     Function:  Eating Eating   Modified Consistency Diet: Yes Eating Assist Level: Supervision or verbal cues           Cognition Comprehension Comprehension assist level: Understands basic 75 - 89% of the time/ requires cueing 10 - 24% of the time;Understands basic 50 - 74% of the time/ requires cueing 25 - 49% of the time  Expression   Expression assist level: Expresses basic 25 - 49% of the time/requires cueing 50 - 75% of the time. Uses single words/gestures.  Social Interaction Social Interaction assist level: Interacts appropriately 75 - 89% of the time - Needs redirection for appropriate language or to initiate interaction.  Problem Solving Problem solving assist level: Solves basic 75 - 89% of the time/requires cueing 10 - 24% of the time  Memory Memory assist level: Recognizes or recalls 25 - 49% of the time/requires cueing 50 - 75% of the time    Pain Pain Assessment Pain Scale: Faces Pain Score: 0-No pain  Therapy/Group: Individual Therapy  Sadat Sliwa  [facility] 03/08/2018, 4:25 PM

## 2018-03-08 NOTE — Progress Notes (Signed)
Pt was incontinent of urine, bladder scan performed. Pt was placed in the chair mode via the bed and given a urinal. Pt was able to void 3ml's. Bladder re-scanned pt placed in chair mode again relaxation technique taught to pt. Pt was unable to void. Will continue to encourage voiding. Family educated regarding plan of care for bladder management.

## 2018-03-08 NOTE — Progress Notes (Addendum)
Speech Language Pathology Daily Make-up Session Note  Patient Details  Name: Xavier Villegas MRN: [id number] Date of Birth: 23-Aug-1980  Today's Date: 03/08/2018 SLP Individual Time: 1130-1200 SLP Individual Time Calculation (min): 30 min  Short Term Goals: Week 1: SLP Short Term Goal 1 (Week 1): Pt will consume dysphagia 2 and nectar thick liquids with min cues for use of swallowing precautions and minimal overt s/s of aspiration with min A verbal cues for swallow strategies. SLP Short Term Goal 1 - Progress (Week 1): Updated due to goal met SLP Short Term Goal 2 (Week 1): Pt will consume therapeutic trials of thin liquids with min cues for use of swallowing precautions and minimal overt s/s of aspiration.  SLP Short Term Goal 3 (Week 1): Pt will voice at the vocalic syllble level in 50% of opportunities with max assist multimodal cues.   SLP Short Term Goal 4 (Week 1): Pt will follow 1 step commands during basic, familiar tasks for >50% accuracy with mod assist multimodal cues.   SLP Short Term Goal 5 (Week 1): Pt will complete basic, familiar ADLs with mod assist for functional problem solving.   SLP Short Term Goal 6 (Week 1): Pt will locate items to the right of midline during basic, familiar tasks with min assist multimodal cues.    Skilled Therapeutic Interventions: Skilled treatment session focused on dysphagia and speech goals. Patient performed oral care via the suction toothbrush with set-up assist and supervision verbal cues for problem solving (turning off suction). SLP facilitated session by providing Min A verbal cues for use of small sips and for use of a complete lip seal with thin liquids via cup. Patient consumed trials with subtle throat clear X 1 out of 8 trials and use of multiple swallows as sip size increased. Therefore, recommend patient initiate the water protocol. Patient also answered basic yes/no questions in regards to wants/needs and basic biographical  information with 75% accuracy. Spontaneous verbalizations noted with patient stating, "alright," "bye," and "31" when attempting to report his age. Patient left upright in recliner with alarm on and all needs within reach. Continue with current plan of care.      Function:  Eating Eating   Modified Consistency Diet: Yes Eating Assist Level: Supervision or verbal cues           Cognition Comprehension Comprehension assist level: Understands basic 50 - 74% of the time/ requires cueing 25 - 49% of the time  Expression   Expression assist level: Expresses basic 25 - 49% of the time/requires cueing 50 - 75% of the time. Uses single words/gestures.  Social Interaction Social Interaction assist level: Interacts appropriately 75 - 89% of the time - Needs redirection for appropriate language or to initiate interaction.  Problem Solving Problem solving assist level: Solves basic 50 - 74% of the time/requires cueing 25 - 49% of the time  Memory Memory assist level: Recognizes or recalls 25 - 49% of the time/requires cueing 50 - 75% of the time    Pain No indications of pain   Therapy/Group: Individual Therapy  Berenize Gatlin 03/08/2018, 12:41 PM

## 2018-03-08 NOTE — Progress Notes (Signed)
Recreational Therapy Session Note  Patient Details  Name: Xavier Villegas MRN: [id number] Date of Birth: Apr 06, 1981 Today's Date: 03/08/2018  TR eval deferred as pt is not yet appropriate for TR services.  Will continue to monitor through team for future participation. [place] 03/08/2018, 3:45 PM

## 2018-03-08 NOTE — Progress Notes (Signed)
Occupational Therapy Session Note  Patient Details  Name: Xavier Villegas MRN: [id number] Date of Birth: 12-Feb-1981  Today's Date: 03/08/2018 OT Individual Time: 6301-6010 OT Individual Time Calculation (min): 83 min    Short Term Goals: Week 1:  OT Short Term Goal 1 (Week 1): Pt will perform toilet transfer with mod A to decrease level of assist with self care. OT Short Term Goal 2 (Week 1): Pt will perform toilet hygiene with mod A for standing balance.  OT Short Term Goal 3 (Week 1): Pt will perform LB dressing with mod A.  Skilled Therapeutic Interventions/Progress Updates:    Pt received sitting up in recliner, agreeable to therapy via nodding, with no c/o pain throughout session. Session focused on ADL transfers, b/d tasks with hemi technique, Rt attention, and functional activity tolerance. Pt used stedy, with good carryover of technique to transfer into shower, requiring no lifting A to stand in stedy, but mod vc for attention to R UE/LE during transfer. Min A required to doff LB clothing at seated/standing level on TTB, using grab bar. Pt completed UB bathing with vc for thoroughness, requiring manual A to lift R UE to clean underneath. Pt stood in shower with R UE on grab bar and required max A to complete posterior peri cleansing. Pt used stedy to transfer out of shower and back to recliner. Skilled monitoring of hemodynamic stability throughout session, with values recorded below. Pt provided with extended rest breaks when diastolic BP >932. Pt required min A to don shirt, with vc and manual A for using hemi dressing technique. Mod A provided during LB dressing. Lastly, pt completed pipe tree at seated level to challenge problem solving, as well as R attention (pieces placed on R side, out of view). Tactile cues provided to facilitate R attention throughout session. Pt required mod vc for problem solving through pipe tree. RN requested pt return to bed following session d/t  elevated BP, with mod A provided during stand pivot toward pt's L unaffected side. Pt left in bed with family present and bed alarm set.    Pt's BP were as follows during session:  Sitting: 137/90 Following stedy transfer: 155/102 Following Shower: 162/122 End of session: 152/104 Pt unable to fully communicate any symptomology related to BP throughout session.   Therapy Documentation Precautions:  Precautions Precautions: Fall Precaution Comments: bone flap in L abdomen, no L side lying Restrictions Weight Bearing Restrictions: No Vital Signs: Therapy Vitals BP: (!) 152/104 Patient Position (if appropriate): Sitting Pain: Pain Assessment Pain Scale: Faces Pain Score: 0-No pain  See Function Navigator for Current Functional Status.   Therapy/Group: Individual Therapy  Curtis Sites 03/08/2018, 3:37 PM

## 2018-03-08 NOTE — Progress Notes (Signed)
Physical Therapy Session Note  Patient Details  Name: Xavier Villegas MRN: [id number] Date of Birth: November 10, 1980  Today's Date: 03/08/2018 PT Individual Time: 1000-1055 PT Individual Time Calculation (min): 55 min   Short Term Goals: Week 1:  PT Short Term Goal 1 (Week 1): Pt will scan to R environment in 50% of opportunities with min cues PT Short Term Goal 2 (Week 1): Pt will ambulate 30' with +1 assist PT Short Term Goal 3 (Week 1): Pt will initiate stair training with PT PT Short Term Goal 4 (Week 1): Pt will demo dynamic sitting balance with consistent min assist PT Short Term Goal 5 (Week 1): Pt will propel w/c 50' with min assist  Skilled Therapeutic Interventions/Progress Updates:    Pt gestures slight discomfort to L abdomen prior to session start and therapist monitored sx throughout treatment duration. BP assessed in supine to be 126/92 with HR= 95 bpm. Pt performs bridging to assist with donning shorts with therapist providing min A. Supine> sitting EOB min A with verbal cues to scoot forward in preparation for stand pivot. Pt dons helmet with min A for strap. Stand pivot transfer from EOB> w/c mod A when transferring towards stronger L LE. Pt performs x2 trials of dynavision sitting in w/c to work on problem solving, functional reaching, visual scanning and R attention with L UE. First trial: Pt reaction time to hitting 15/15 red lights= 2.47sec; 2nd trial= reaction time was 1.19 sec hitting 20/35 red lights. PT attempted to initiate dynavision in standing but Pt gestured inc head pain upon STS with therapist blocking R LE providing mod A. BP assessed to be 142/101 and HR=116 bpm. Pt rested for 5 min and BP reassessed to be 135/98 and HR=114 bpm. Pt performed x3 trials of kinetron resistance level 70 for R LE hip extension activation and reciprocal stepping with therapist providing tactile feedback for quad/hamstring activation sequencing. Pt performed 3 min, 30 sec, and 50 sec  with rest break in between sets. Pt performed AAROM with HOH assist into R UE elbow flex/ext for ROM and control tone for 1/10 reps. Pt has increase in R UE tone with spasms observed during tx session. Pt then reports inc in head pain with BP=141/105 and HR=131 bpm. Nursing notified of Pt status and requests Pt be seated in recliner. Pt performs stand pivot from w/c>recliner with mod A and R LE blocking during pivot. Pt left in recliner with call bell in reach, chair alarm activated, and all needs met.   Therapy Documentation Precautions:  Precautions Precautions: Fall Precaution Comments: bone flap in L abdomen, no L side lying Restrictions Weight Bearing Restrictions: No   See Function Navigator for Current Functional Status.   Therapy/Group: Individual Therapy  Floreen Comber 03/08/2018, 11:59 AM

## 2018-03-08 NOTE — Progress Notes (Signed)
Subjective/Complaints: Pt in bed. . Family at bedside who helped him drink bottle of Dasani water with straw. Stated he did well. I asked if anyone had given clearance to do this, and was told "no".  Had some spasms over night  ROS: limited due to language/communication .    Objective: Vital Signs: Blood pressure 119/89, pulse 88, temperature 98 F (36.7 C), temperature source Axillary, resp. rate 18, height 6' (1.829 m), weight 83 kg (182 lb 15.7 oz), SpO2 99 %. No results found. Results for orders placed or performed during the hospital encounter of 03/01/18 (from the past 72 hour(s))  Urinalysis, Routine w reflex microscopic     Status: Abnormal   Collection Time: 03/07/18  1:38 AM  Result Value Ref Range   Color, Urine YELLOW YELLOW   APPearance CLEAR CLEAR   Specific Gravity, Urine 1.012 1.005 - 1.030   pH 5.0 5.0 - 8.0   Glucose, UA NEGATIVE NEGATIVE mg/dL   Hgb urine dipstick SMALL (A) NEGATIVE   Bilirubin Urine NEGATIVE NEGATIVE   Ketones, ur NEGATIVE NEGATIVE mg/dL   Protein, ur NEGATIVE NEGATIVE mg/dL   Nitrite NEGATIVE NEGATIVE   Leukocytes, UA NEGATIVE NEGATIVE   RBC / HPF 0-5 0 - 5 RBC/hpf   WBC, UA 0-5 0 - 5 WBC/hpf   Bacteria, UA NONE SEEN NONE SEEN    Comment: Performed at [facility], [address] [place], [place] [address]  Basic metabolic panel     Status: Abnormal   Collection Time: 03/07/18  4:50 AM  Result Value Ref Range   Sodium 138 135 - 145 mmol/L   Potassium 4.3 3.5 - 5.1 mmol/L   Chloride 101 98 - 111 mmol/L   CO2 25 22 - 32 mmol/L   Glucose, Bld 104 (H) 70 - 99 mg/dL   BUN 20 6 - 20 mg/dL   Creatinine, Ser 0.68 0.61 - 1.24 mg/dL   Calcium 9.9 8.9 - 10.3 mg/dL   GFR calc non Af Amer >60 >60 mL/min   GFR calc Af Amer >60 >60 mL/min    Comment: (NOTE) The eGFR has been calculated using the CKD EPI equation. This calculation has not been validated in all clinical situations. eGFR's persistently <60 mL/min signify possible Chronic  Kidney Disease.    Anion gap 12 5 - 15    Comment: Performed at [facility] [address], [place], [place] [address]  CBC     Status: Abnormal   Collection Time: 03/07/18  4:50 AM  Result Value Ref Range   WBC 15.0 (H) 4.0 - 10.5 K/uL   RBC 4.47 4.22 - 5.81 MIL/uL   Hemoglobin 16.4 13.0 - 17.0 g/dL   HCT 46.9 39.0 - 52.0 %   MCV 104.9 (H) 78.0 - 100.0 fL   MCH 36.7 (H) 26.0 - 34.0 pg   MCHC 35.0 30.0 - 36.0 g/dL   RDW 12.3 11.5 - 15.5 %   Platelets 527 (H) 150 - 400 K/uL    Comment: Performed at [facility], [place] [address] [place], [place] [address]     Constitutional: No distress . Vital signs reviewed. HEENT: EOMI, oral membranes moist Neck: supple Cardiovascular: RRR without murmur. No JVD    Respiratory: CTA Bilaterally without wheezes or rales. Normal effort    GI: BS +, non-tender, non-distended  Neuro:  Limited due to aphasia Motor: lUE/LLE: 5/5 proximal distal RUE/RLE; 0/5 proximal distal (no new change) MAS: Left elbow flexors 1-2/4, right  ankle and plantar flexors 1/4, toes up Musc/Skel:  No edema or tenderness in extremities Psych: pleasant and cooperative Skin: scalp staples intact   Assessment/Plan: 1. Functional deficits secondary to left MCA infarct with hemorrhagic transformation status post craniectomy which require 3+ hours per day of interdisciplinary therapy in a comprehensive inpatient rehab setting. Physiatrist is providing close team supervision and 24 hour management of active medical problems listed below. Physiatrist and rehab team continue to assess barriers to discharge/monitor patient progress toward functional and medical goals. FIM: Function - Bathing Position: Wheelchair/chair at sink Body parts bathed by patient: Right arm, Chest, Abdomen, Front perineal area Body parts bathed by helper: Left arm, Buttocks, Right upper leg, Left upper leg, Right lower leg, Left lower leg, Back Assist Level: 2 helpers  Function- Upper  Body Dressing/Undressing What is the patient wearing?: Pull over shirt/dress Pull over shirt/dress - Perfomed by patient: Thread/unthread left sleeve, Put head through opening, Pull shirt over trunk Pull over shirt/dress - Perfomed by helper: Thread/unthread right sleeve Assist Level: Touching or steadying assistance(Pt > 75%) Function - Lower Body Dressing/Undressing What is the patient wearing?: Pants, Socks, Shoes Position: Wheelchair/chair at sink Pants- Performed by patient: Thread/unthread right pants leg, Thread/unthread left pants leg, Pull pants up/down Pants- Performed by helper: Thread/unthread right pants leg, Thread/unthread left pants leg, Pull pants up/down Non-skid slipper socks- Performed by patient: Don/doff right sock, Don/doff left sock Non-skid slipper socks- Performed by helper: Don/doff right sock, Don/doff left sock Socks - Performed by patient: Don/doff right sock, Don/doff left sock(supine in bed) Shoes - Performed by patient: Don/doff right shoe, Don/doff left shoe(supine in bed) Shoes - Performed by helper: Fasten right, Fasten left Assist for footwear: Dependant Assist for lower body dressing: (Max A for dynamic standing balance)  Function - Toileting Toileting activity did not occur: No continent bowel/bladder event Toileting steps completed by helper: Adjust clothing prior to toileting, Performs perineal hygiene, Adjust clothing after toileting Toileting Assistive Devices: Grab bar or rail Assist level: Two helpers  Function - Air cabin crew transfer activity did not occur: Safety/medical concerns Toilet transfer assistive device: Radio broadcast assistant lift: Stedy Assist level to toilet: Maximal assist (Pt 25 - 49%/lift and lower) Assist level from toilet: 2 helpers  Function - Chair/bed transfer Chair/bed transfer activity did not occur: Safety/medical concerns Chair/bed transfer method: Stand pivot Chair/bed transfer assist level: Moderate  assist (Pt 50 - 74%/lift or lower) Chair/bed transfer assistive device: Armrests Chair/bed transfer details: Verbal cues for technique, Verbal cues for precautions/safety, Manual facilitation for weight shifting, Manual facilitation for placement  Function - Locomotion: Wheelchair Will patient use wheelchair at discharge?: (tbd) Type: Manual Max wheelchair distance: 150 ft Assist Level: Touching or steadying assistance (Pt > 75%) Assist Level: Touching or steadying assistance (Pt > 75%) Assist Level: Touching or steadying assistance (Pt > 75%) Turns around,maneuvers to table,bed, and toilet,negotiates 3% grade,maneuvers on rugs and over doorsills: No Function - Locomotion: Ambulation Ambulation activity did not occur: Safety/medical concerns Assistive device: Rail in hallway Max distance: 15 Assist level: 2 helpers(1 person providing max A and second helper w/c follow) Assist level: 2 helpers Walk 50 feet with 2 turns activity did not occur: Safety/medical concerns Walk 150 feet activity did not occur: Safety/medical concerns Walk 10 feet on uneven surfaces activity did not occur: Safety/medical concerns  Function - Comprehension Comprehension: Auditory Comprehension assist level: Understands basic 50 - 74% of the time/ requires cueing 25 - 49% of the time  Function - Expression Expression:  Verbal Expression assist level: Expresses basic 25 - 49% of the time/requires cueing 50 - 75% of the time. Uses single words/gestures., Expresses basis less than 25% of the time/requires cueing >75% of the time.  Function - Social Interaction Social Interaction assist level: Interacts appropriately 25 - 49% of time - Needs frequent redirection.  Function - Problem Solving Problem solving assist level: Solves basic 50 - 74% of the time/requires cueing 25 - 49% of the time  Function - Memory Memory assist level: Recognizes or recalls 25 - 49% of the time/requires cueing 50 - 75% of the  time Patient normally able to recall (first 3 days only): That he or she is in a hospital   Medical Problem List and Plan: 1.  Right hemiparesis, dysphagia, and aphasia secondary to large left MCA infarct with hemorrhagic transformation s/p hemicraniectomy.  Continue CIR  Helmet  2.  DVT Prophylaxis/Anticoagulation: Mechanical: Sequential compression devices, below knee Bilateral lower extremities 3. Pain Management: Tylenol as needed  -flexeril for spasms  -splints 4. Mood: Consult LCSW  for evaluation and support 5. Neuropsych: This patient is not fully capable of making decisions on his own behalf. 6. Skin/Wound Care: Routine pressure relief measures.  Monitor wound for healing.  Maintain adequate nutritional status. 7. Fluids/Electrolytes/Nutrition: Monitor I's/O's.   Encourage nectar thick fluid intake.   8. Dysphagia: Continue dysphagia 1, nectar liquids--offer fluids frequently. Off IVF- intake variable. Asked family not to advance his liquids until cleared by SLP. It is encouraging that he had not problems with the bottled water/straw today however 9. Leucocytosis: ?Progressive and felt to be reactive--ID recommends d/c antibiotic and monitor for signs of infection.   Mother with history of CLL.  WBCs 15 7/29    10. High homocysteine levels/low folate levels/boderline low B12: On high dose folic supplement, B6 and received B12 inj 7/14  . MRHFR--heterozygous /single mutation ID.  May cause abnormal clotting 11. GERD: Continue PPI.  12. Hyponatremia  Sodium 138 on 7/29  I     LOS (Days) 7 A FACE TO FACE EVALUATION WAS PERFORMED  Meredith Staggers 03/08/2018, 8:59 AM

## 2018-03-08 NOTE — Progress Notes (Signed)
Pt attempted to void at 1800 and was unsuccessful. Bladder Scan 500cc. Per Naaman Plummer, MD pt not to be I&O cathed at the 8 hour mark and let pt attempt to void on own. RN educated pt and family on POC with bladder at this time. Family understanding and pleased with POC. RN will pass along care for next oncoming RN to provide bladder management.

## 2018-03-09 ENCOUNTER — Inpatient Hospital Stay (HOSPITAL_COMMUNITY): Payer: BLUE CROSS/BLUE SHIELD | Admitting: Occupational Therapy

## 2018-03-09 ENCOUNTER — Ambulatory Visit (HOSPITAL_COMMUNITY): Payer: BLUE CROSS/BLUE SHIELD

## 2018-03-09 ENCOUNTER — Inpatient Hospital Stay (HOSPITAL_COMMUNITY): Payer: BLUE CROSS/BLUE SHIELD | Admitting: Physical Therapy

## 2018-03-09 LAB — URINE CULTURE: CULTURE: NO GROWTH

## 2018-03-09 MED ORDER — HYDROCODONE-ACETAMINOPHEN 5-325 MG PO TABS
1.0000 | ORAL_TABLET | Freq: Two times a day (BID) | ORAL | Status: DC
  Administered 2018-03-09 – 2018-03-30 (×41): 1 via ORAL
  Filled 2018-03-09 (×43): qty 1

## 2018-03-09 NOTE — Progress Notes (Signed)
Occupational Therapy Session Note  Patient Details  Name: Xavier Villegas MRN: [id number] Date of Birth: [birth year]  Today's Date: 03/09/2018 OT Individual Time: 1302-1400 OT Individual Time Calculation (min): 58 min    Short Term Goals: Week 1:  OT Short Term Goal 1 (Week 1): Pt will perform toilet transfer with mod A to decrease level of assist with self care. OT Short Term Goal 2 (Week 1): Pt will perform toilet hygiene with mod A for standing balance.  OT Short Term Goal 3 (Week 1): Pt will perform LB dressing with mod A.  Skilled Therapeutic Interventions/Progress Updates:    Upon entering the room, pt supine in bed with Aunt present. Pt with no signs or symptoms of pain during session. Pt performed bed mobility with supervision and donned helmet himself. Pt standing from bed x2 reps for 2 minutes each with R knee blocked and BP taken while standing of 145/103. Pt taking seated rest and transferring to R with max A for stand pivot transfer. Pt shaking head "yes" when asked if needing to use bathroom. Pt transferred from wheelchair > toilet with use of grab bar and max A for lifting and lowering. Pt able to void urine and have BM with increased time. Pt standing inside of STEDY with min A for balance for hygiene and clothing management. Pt taking seated rest break after returning to wheelchair via STEDY. Pt washing B hands at sink with min verbal cues for both hands. BP taken while seated of 139/101. OT provided education to aunt regarding vision and inattention to R with OT asking caregivers to sit on R side to facilitate head turns and scanning.   Therapy Documentation Precautions:  Precautions Precautions: Fall Precaution Comments: bone flap in L abdomen, no L side lying Restrictions Weight Bearing Restrictions: No General:   Vital Signs: Therapy Vitals Temp: 98.3 F (36.8 C) Temp Source: Oral Pulse Rate: 87 Resp: 18 BP: (!) 132/97 Patient Position (if appropriate):  Sitting Oxygen Therapy SpO2: 98 % O2 Device: Room Air Pain: Pain Assessment Faces Pain Scale: Hurts little more Pain Type: Acute pain Pain Location: Arm Pain Orientation: Right Pain Descriptors / Indicators: Spasm;Cramping Pain Onset: Sudden Pain Intervention(s): RN made aware;Repositioned  See Function Navigator for Current Functional Status.   Therapy/Group: Individual Therapy  [name] 03/09/2018, 4:31 PM

## 2018-03-09 NOTE — Progress Notes (Signed)
Physical Therapy Session Note  Patient Details  Name: Xavier Villegas MRN: [id number] Date of Birth: [birth year]  Today's Date: 03/09/2018 PT Individual Time: 1515-1630 PT Individual Time Calculation (min): 75 min   Short Term Goals: Week 1:  PT Short Term Goal 1 (Week 1): Pt will scan to R environment in 50% of opportunities with min cues PT Short Term Goal 2 (Week 1): Pt will ambulate 30' with +1 assist PT Short Term Goal 3 (Week 1): Pt will initiate stair training with PT PT Short Term Goal 4 (Week 1): Pt will demo dynamic sitting balance with consistent min assist PT Short Term Goal 5 (Week 1): Pt will propel w/c 50' with min assist  Skilled Therapeutic Interventions/Progress Updates:    Pt gestured slight head discomfort prior to treatment session onset and therapist monitored Pt sx throughout treatment duration. Pt required total A for donning shoes prior to bed mobility. Supine>sitting EOB supervision with increased time and ability to don helmet with set up assist. Stand pivot transfer towards stronger LLE from bed> w/c mod A. PT propelled Pt to gym for time management. PT initiated w/c training with LUE & LLE for propulsion for 58f with max multimodal cueing and excellent carry over. Pt then performed x4 trials of cone weaving with 75% accuracy and min-mod VC's for R side attention. Pt performed stand pivot from w/c> mat with min A towards stronger side. Pt performed x1 trial with no AD, and x2 trials with HW working on LLE stepping until fatigue with therapist providing mod A for R knee blocking and pelvis facilitation for R wt shifting to off load L LE for stepping. Pt BP s/p 1st trial= 135/90; BP reassessed s/p 3rd stepping trial= 140/105. Pt required frequent prolonged rest breaks b/w trials with therapist reassessing symptoms and working within tolerance level. Pt performed x2 squat pivot transfers from mat<>w/c with min guard. Pt ambulated x2 trials for 25 ft with L HR and  therapist providing max A for R LE knee blocking, stepping, and R wt shifting to allow advancement of L LE. Therapist also providing tactile feed back to R LE quad for proper activation during stance. 2nd helper provided close w/c follow. Pt BP s/p gt was 138/99 with Pt asymptomatic. PT propelled w/c back to room for time management and Pt performed stand pivot transfer from w/c> recliner with mod A towards weaker R LE. Pt positioned in recliner with call bell in reach, family present, and all needs met.  Therapy Documentation Precautions:  Precautions Precautions: Fall Precaution Comments: bone flap in L abdomen, no L side lying Restrictions Weight Bearing Restrictions: No      See Function Navigator for Current Functional Status.   Therapy/Group: Individual Therapy  CFloreen Comber7/31/2019, 4:39 PM

## 2018-03-09 NOTE — Progress Notes (Signed)
Subjective/Complaints: Pt up in bed. RN reports incontinence.   ROS: limited due to language/communication     Objective: Vital Signs: Blood pressure 127/80, pulse 72, temperature 97.9 F (36.6 C), temperature source Oral, resp. rate 17, height 6' (1.829 m), weight 83 kg (182 lb 15.7 oz), SpO2 99 %. No results found. Results for orders placed or performed during the hospital encounter of 03/01/18 (from the past 72 hour(s))  Urinalysis, Routine w reflex microscopic     Status: Abnormal   Collection Time: 03/07/18  1:38 AM  Result Value Ref Range   Color, Urine YELLOW YELLOW   APPearance CLEAR CLEAR   Specific Gravity, Urine 1.012 1.005 - 1.030   pH 5.0 5.0 - 8.0   Glucose, UA NEGATIVE NEGATIVE mg/dL   Hgb urine dipstick SMALL (A) NEGATIVE   Bilirubin Urine NEGATIVE NEGATIVE   Ketones, ur NEGATIVE NEGATIVE mg/dL   Protein, ur NEGATIVE NEGATIVE mg/dL   Nitrite NEGATIVE NEGATIVE   Leukocytes, UA NEGATIVE NEGATIVE   RBC / HPF 0-5 0 - 5 RBC/hpf   WBC, UA 0-5 0 - 5 WBC/hpf   Bacteria, UA NONE SEEN NONE SEEN    Comment: Performed at [facility], [address] [place], [place] [address]  Urine Culture     Status: None   Collection Time: 03/07/18  2:08 AM  Result Value Ref Range   Specimen Description URINE, CATHETERIZED    Special Requests NONE    Culture      NO GROWTH Performed at [facility], [place] [address] [place], [place] [address]    Report Status 03/09/2018 FINAL   Basic metabolic panel     Status: Abnormal   Collection Time: 03/07/18  4:50 AM  Result Value Ref Range   Sodium 138 135 - 145 mmol/L   Potassium 4.3 3.5 - 5.1 mmol/L   Chloride 101 98 - 111 mmol/L   CO2 25 22 - 32 mmol/L   Glucose, Bld 104 (H) 70 - 99 mg/dL   BUN 20 6 - 20 mg/dL   Creatinine, Ser 0.68 0.61 - 1.24 mg/dL   Calcium 9.9 8.9 - 10.3 mg/dL   GFR calc non Af Amer >60 >60 mL/min   GFR calc Af Amer >60 >60 mL/min    Comment: (NOTE) The eGFR has been calculated using the CKD EPI  equation. This calculation has not been validated in all clinical situations. eGFR's persistently <60 mL/min signify possible Chronic Kidney Disease.    Anion gap 12 5 - 15    Comment: Performed at [facility] [address] [place], [place] [address]  CBC     Status: Abnormal   Collection Time: 03/07/18  4:50 AM  Result Value Ref Range   WBC 15.0 (H) 4.0 - 10.5 K/uL   RBC 4.47 4.22 - 5.81 MIL/uL   Hemoglobin 16.4 13.0 - 17.0 g/dL   HCT 46.9 39.0 - 52.0 %   MCV 104.9 (H) 78.0 - 100.0 fL   MCH 36.7 (H) 26.0 - 34.0 pg   MCHC 35.0 30.0 - 36.0 g/dL   RDW 12.3 11.5 - 15.5 %   Platelets 527 (H) 150 - 400 K/uL    Comment: Performed at [facility], [place] [address], [place], [place] [address]  Magnesium     Status: None   Collection Time: 03/08/18  8:45 PM  Result Value Ref Range   Magnesium 1.8 1.7 - 2.4 mg/dL    Comment: Performed at [facility]  [facility], [place] [address] [place], [place] [address]     Constitutional: No distress . Vital signs reviewed. HEENT: EOMI, oral membranes moist Neck: supple Cardiovascular: RRR without murmur. No JVD    Respiratory: CTA Bilaterally without wheezes or rales. Normal effort    GI: BS +, non-tender, non-distended  Neuro:  Limited due to aphasia Motor: lUE/LLE: 5/5 proximal distal RUE/RLE; 0/5 proximal distal (unchanged) MAS: Left elbow flexors 1-2/4, right ankle and plantar flexors 1/4, toes up Musc/Skel:  No edema or tenderness in extremities Psych: pleasant and cooperative Skin: scalp staples intact   Assessment/Plan: 1. Functional deficits secondary to left MCA infarct with hemorrhagic transformation status post craniectomy which require 3+ hours per day of interdisciplinary therapy in a comprehensive inpatient rehab setting. Physiatrist is providing close team supervision and 24 hour management of active medical problems listed below. Physiatrist and rehab team continue to assess barriers to discharge/monitor patient  progress toward functional and medical goals. FIM: Function - Bathing Position: Shower Body parts bathed by patient: Right arm, Chest, Abdomen, Front perineal area, Right upper leg, Left upper leg, Left lower leg Body parts bathed by helper: Left arm, Back, Buttocks Assist Level: Touching or steadying assistance(Pt > 75%)  Function- Upper Body Dressing/Undressing What is the patient wearing?: Pull over shirt/dress Pull over shirt/dress - Perfomed by patient: Thread/unthread left sleeve, Put head through opening, Pull shirt over trunk Pull over shirt/dress - Perfomed by helper: Thread/unthread right sleeve Assist Level: Touching or steadying assistance(Pt > 75%) Function - Lower Body Dressing/Undressing What is the patient wearing?: Pants, Socks, Shoes Position: Wheelchair/chair at sink Pants- Performed by patient: Thread/unthread left pants leg, Pull pants up/down Pants- Performed by helper: Thread/unthread left pants leg Non-skid slipper socks- Performed by patient: Don/doff right sock, Don/doff left sock Non-skid slipper socks- Performed by helper: Don/doff right sock, Don/doff left sock Socks - Performed by patient: Don/doff right sock, Don/doff left sock(supine in bed) Socks - Performed by helper: Don/doff right sock, Don/doff left sock Shoes - Performed by patient: Don/doff right shoe, Don/doff left shoe(supine in bed) Shoes - Performed by helper: Don/doff right shoe, Don/doff left shoe, Fasten left, Fasten right Assist for footwear: Maximal assist Assist for lower body dressing: (max A for dynamic standing balance)  Function - Toileting Toileting activity did not occur: No continent bowel/bladder event Toileting steps completed by helper: Adjust clothing prior to toileting, Performs perineal hygiene, Adjust clothing after toileting Toileting Assistive Devices: Grab bar or rail Assist level: Two helpers  Function - Air cabin crew transfer activity did not occur:  Safety/medical concerns Toilet transfer assistive device: Radio broadcast assistant lift: Stedy Assist level to toilet: Maximal assist (Pt 25 - 49%/lift and lower) Assist level from toilet: 2 helpers  Function - Chair/bed transfer Chair/bed transfer activity did not occur: Safety/medical concerns Chair/bed transfer method: Stand pivot Chair/bed transfer assist level: Moderate assist (Pt 50 - 74%/lift or lower) Chair/bed transfer assistive device: Armrests Chair/bed transfer details: Verbal cues for technique, Verbal cues for precautions/safety, Manual facilitation for weight shifting, Manual facilitation for placement  Function - Locomotion: Wheelchair Will patient use wheelchair at discharge?: (tbd) Type: Manual Max wheelchair distance: 150 ft Assist Level: Touching or steadying assistance (Pt > 75%) Assist Level: Touching or steadying assistance (Pt > 75%) Assist Level: Touching or steadying assistance (Pt > 75%) Turns around,maneuvers to table,bed, and toilet,negotiates 3% grade,maneuvers on rugs and over doorsills: No Function - Locomotion: Ambulation Ambulation activity did not occur: Safety/medical concerns Assistive device: Rail in hallway  Max distance: 15 Assist level: 2 helpers(1 person providing max A and second helper w/c follow) Assist level: 2 helpers Walk 50 feet with 2 turns activity did not occur: Safety/medical concerns Walk 150 feet activity did not occur: Safety/medical concerns Walk 10 feet on uneven surfaces activity did not occur: Safety/medical concerns  Function - Comprehension Comprehension: Auditory Comprehension assist level: Understands basic 75 - 89% of the time/ requires cueing 10 - 24% of the time  Function - Expression Expression: Verbal Expression assist level: Expresses basic 25 - 49% of the time/requires cueing 50 - 75% of the time. Uses single words/gestures.  Function - Social Interaction Social Interaction assist level: Interacts appropriately  75 - 89% of the time - Needs redirection for appropriate language or to initiate interaction.  Function - Problem Solving Problem solving assist level: Solves basic 75 - 89% of the time/requires cueing 10 - 24% of the time  Function - Memory Memory assist level: Recognizes or recalls 25 - 49% of the time/requires cueing 50 - 75% of the time Patient normally able to recall (first 3 days only): That he or she is in a hospital   Medical Problem List and Plan: 1.  Right hemiparesis, dysphagia, and aphasia secondary to large left MCA infarct with hemorrhagic transformation s/p hemicraniectomy.  Continue CIR  Helmet for safety OOB  -timed voids to help with emptying/incontinence 2.  DVT Prophylaxis/Anticoagulation: Mechanical: Sequential compression devices, below knee Bilateral lower extremities 3. Pain Management: Tylenol as needed  -flexeril for spasms  -splints 4. Mood: Consult LCSW  for evaluation and support 5. Neuropsych: This patient is not fully capable of making decisions on his own behalf. 6. Skin/Wound Care: Routine pressure relief measures.  Monitor wound for healing.  Maintain adequate nutritional status. 7. Fluids/Electrolytes/Nutrition: Monitor I's/O's.   Encourage nectar thick fluid intake.   8. Dysphagia: Continue dysphagia 3, nectar liquids--offer fluids frequently.   -encourage fluids 9. Leucocytosis: ?Progressive and felt to be reactive--ID recommends d/c antibiotic and monitor for signs of infection.   Mother with history of CLL.  WBCs 15 7/29    10. High homocysteine levels/low folate levels/boderline low B12: On high dose folic supplement, B6 and received B12 inj 7/14  . MRHFR--heterozygous /single mutation ID.  May cause abnormal clotting 11. GERD: Continue PPI.  12. Hyponatremia  Sodium 138 on 7/29  I     LOS (Days) 8 A FACE TO FACE EVALUATION WAS PERFORMED  Meredith Staggers 03/09/2018, 9:07 AM

## 2018-03-09 NOTE — Progress Notes (Signed)
Speech Language Pathology Weekly Progress and Session Note  Patient Details  Name: Xavier Villegas MRN: [id number] Date of Birth: 1980/10/18  Beginning of progress report period: March 02, 2018 End of progress report period: March 09, 2018  Today's Date: 03/09/2018 SLP Individual Time: 1100-1200 SLP Individual Time Calculation (min): 60 min  Short Term Goals: Week 1: SLP Short Term Goal 1 (Week 1): Pt will consume dysphagia 2 and nectar thick liquids with min cues for use of swallowing precautions and minimal overt s/s of aspiration with min A verbal cues for swallow strategies. SLP Short Term Goal 1 - Progress (Week 1): Met SLP Short Term Goal 2 (Week 1): Pt will consume therapeutic trials of thin liquids with min cues for use of swallowing precautions and minimal overt s/s of aspiration.  SLP Short Term Goal 2 - Progress (Week 1): Met SLP Short Term Goal 3 (Week 1): Pt will voice at the vocalic syllble level in 50% of opportunities with max assist multimodal cues.   SLP Short Term Goal 3 - Progress (Week 1): Not met SLP Short Term Goal 4 (Week 1): Pt will follow 1 step commands during basic, familiar tasks for >50% accuracy with mod assist multimodal cues.   SLP Short Term Goal 4 - Progress (Week 1): Met SLP Short Term Goal 5 (Week 1): Pt will complete basic, familiar ADLs with mod assist for functional problem solving.   SLP Short Term Goal 5 - Progress (Week 1): Met SLP Short Term Goal 6 (Week 1): Pt will locate items to the right of midline during basic, familiar tasks with min assist multimodal cues.   SLP Short Term Goal 6 - Progress (Week 1): Met    New Short Term Goals: Week 2: SLP Short Term Goal 1 (Week 2): Pt will consume dysphagia 3 and nectar thick liquids with supervision A cues for use of swallowing precautions and minimal overt s/s of aspiration with min A verbal cues for swallow strategies. SLP Short Term Goal 2 (Week 2): Pt will consume therapeutic trials of thin  liquids with supervision A cues for use of swallowing precautions and minimal overt s/s of aspiration.  SLP Short Term Goal 3 (Week 2): Pt will consume therapeutic trials of regular textured foods with supervision A cues for use of swallowing precautions and minimal overt s/s of aspiration.  SLP Short Term Goal 4 (Week 2): Pt will complete basic, familiar tasks with Min assist for (midlly complex) functional problem solving.   SLP Short Term Goal 5 (Week 2): Pt will answer basic yes/no questions utilizing multimodal communication with 90% accuracy given min A verbal cues.  SLP Short Term Goal 6 (Week 2): Pt will locate items to the right of midline during basic, familiar tasks with supervsison assist multimodal cues.    Weekly Progress Updates:Pt demonstrated great progress meeting 5 out 6 goals, requiring Mod-Min A, for use of swallow strategies, basic-mildly complex problem solving, response to yes/no questions to express wants/needs / comprehension of biographical/immediate environment and scanning to right. Pt demonstrated progress in swallow function, currently on dys 3 and NTL, along with water protocol. Pt demonstrates spontaneous speech at word level, however expressive language and apraxia continue to be a greater deficit than receive language.  Pt would continue to benefit from skilled ST services in order to maximize functional independence and reduce burden of care requiring 24/hour supervision and continuing of skilled ST services at next level of care.     Intensity: Minumum of 1-2 x/day,  30 to 90 minutes Frequency: 3 to 5 out of 7 days Duration/Length of Stay: 21-28 days  Treatment/Interventions: Cognitive remediation/compensation;Cueing hierarchy;Dysphagia/aspiration precaution training;Functional tasks;Patient/family education;Environmental controls;Internal/external aids;Multimodal communication approach;Speech/Language facilitation   Daily Session  Skilled Therapeutic  Interventions: Skilled ST services focused on swallow and cognitive skills. SLP facilitated PO consumption of thin liquid following oral care, completed by pt with min-supervision A. Pt demonstrated 1 immediate cough, 1 throat clear and multiple swallows with 6oz of thin via cup. SLP facilitated comprehension of yes/no questions utilizing pictures of family members, pt required min A verbal cues for 80% accuracy utilizing nonverbal communication. Pt communicated wants/needs throughout session given yes/no questions requiring mod A verbal cues for clarification with 90% accuracy. Pt demonstrated pain in right arm, SLP notified nurse and medication was given. SLP facilitated PO consumption of trial tray dys 3, pt demonstrated oral clearance with min-supervision A verbal cues and no overt s/s aspiration. SLP upgraded diet to dys 3. Nurse provided protein shake, SLP thickened to nectar thick and left with nurse. Pt was left in room with call bell within reach and bed alaram set. ST reccomends to continue skilled ST services.     Function:   Eating Eating   Modified Consistency Diet: No(water protocol ) Eating Assist Level: Supervision or verbal cues;Set up assist for   Eating Set Up Assist For: Opening containers       Cognition Comprehension Comprehension assist level: Understands basic 75 - 89% of the time/ requires cueing 10 - 24% of the time  Expression   Expression assist level: Expresses basic 25 - 49% of the time/requires cueing 50 - 75% of the time. Uses single words/gestures.  Social Interaction Social Interaction assist level: Interacts appropriately 75 - 89% of the time - Needs redirection for appropriate language or to initiate interaction.  Problem Solving Problem solving assist level: Solves basic 75 - 89% of the time/requires cueing 10 - 24% of the time  Memory Memory assist level: Recognizes or recalls 25 - 49% of the time/requires cueing 50 - 75% of the time   General     Pain Pain Assessment Faces Pain Scale: Hurts little more Pain Type: Acute pain Pain Location: Arm Pain Orientation: Right Pain Descriptors / Indicators: Spasm;Cramping Pain Onset: Sudden Pain Intervention(s): RN made aware;Repositioned  Therapy/Group: Individual Therapy  Shavon Zenz  [facility] 03/09/2018, 3:02 PM

## 2018-03-10 ENCOUNTER — Inpatient Hospital Stay (HOSPITAL_COMMUNITY): Payer: BLUE CROSS/BLUE SHIELD | Admitting: Speech Pathology

## 2018-03-10 ENCOUNTER — Inpatient Hospital Stay (HOSPITAL_COMMUNITY): Payer: BLUE CROSS/BLUE SHIELD | Admitting: Physical Therapy

## 2018-03-10 ENCOUNTER — Inpatient Hospital Stay (HOSPITAL_COMMUNITY): Payer: BLUE CROSS/BLUE SHIELD

## 2018-03-10 DIAGNOSIS — D72829 Elevated white blood cell count, unspecified: Secondary | ICD-10-CM | POA: Diagnosis present

## 2018-03-10 DIAGNOSIS — Z23 Encounter for immunization: Secondary | ICD-10-CM | POA: Diagnosis not present

## 2018-03-10 DIAGNOSIS — R4701 Aphasia: Secondary | ICD-10-CM | POA: Diagnosis not present

## 2018-03-10 DIAGNOSIS — E7211 Homocystinuria: Secondary | ICD-10-CM | POA: Diagnosis present

## 2018-03-10 DIAGNOSIS — K219 Gastro-esophageal reflux disease without esophagitis: Secondary | ICD-10-CM | POA: Diagnosis present

## 2018-03-10 DIAGNOSIS — G8110 Spastic hemiplegia affecting unspecified side: Secondary | ICD-10-CM | POA: Diagnosis not present

## 2018-03-10 DIAGNOSIS — Z823 Family history of stroke: Secondary | ICD-10-CM | POA: Diagnosis not present

## 2018-03-10 DIAGNOSIS — R339 Retention of urine, unspecified: Secondary | ICD-10-CM | POA: Diagnosis not present

## 2018-03-10 DIAGNOSIS — E538 Deficiency of other specified B group vitamins: Secondary | ICD-10-CM | POA: Diagnosis not present

## 2018-03-10 DIAGNOSIS — I69351 Hemiplegia and hemiparesis following cerebral infarction affecting right dominant side: Secondary | ICD-10-CM | POA: Diagnosis present

## 2018-03-10 DIAGNOSIS — L729 Follicular cyst of the skin and subcutaneous tissue, unspecified: Secondary | ICD-10-CM | POA: Diagnosis not present

## 2018-03-10 DIAGNOSIS — G8191 Hemiplegia, unspecified affecting right dominant side: Secondary | ICD-10-CM | POA: Diagnosis not present

## 2018-03-10 DIAGNOSIS — I63511 Cerebral infarction due to unspecified occlusion or stenosis of right middle cerebral artery: Secondary | ICD-10-CM | POA: Diagnosis not present

## 2018-03-10 DIAGNOSIS — N319 Neuromuscular dysfunction of bladder, unspecified: Secondary | ICD-10-CM | POA: Diagnosis not present

## 2018-03-10 DIAGNOSIS — I69391 Dysphagia following cerebral infarction: Secondary | ICD-10-CM | POA: Diagnosis not present

## 2018-03-10 DIAGNOSIS — E871 Hypo-osmolality and hyponatremia: Secondary | ICD-10-CM | POA: Diagnosis not present

## 2018-03-10 DIAGNOSIS — K592 Neurogenic bowel, not elsewhere classified: Secondary | ICD-10-CM | POA: Diagnosis present

## 2018-03-10 DIAGNOSIS — I69332 Monoplegia of upper limb following cerebral infarction affecting left dominant side: Secondary | ICD-10-CM | POA: Diagnosis not present

## 2018-03-10 DIAGNOSIS — F17210 Nicotine dependence, cigarettes, uncomplicated: Secondary | ICD-10-CM | POA: Diagnosis present

## 2018-03-10 DIAGNOSIS — L723 Sebaceous cyst: Secondary | ICD-10-CM | POA: Diagnosis not present

## 2018-03-10 DIAGNOSIS — R1310 Dysphagia, unspecified: Secondary | ICD-10-CM | POA: Diagnosis present

## 2018-03-10 DIAGNOSIS — I69320 Aphasia following cerebral infarction: Secondary | ICD-10-CM | POA: Diagnosis not present

## 2018-03-10 MED ORDER — MUSCLE RUB 10-15 % EX CREA: 1.0000 "application " | TOPICAL_CREAM | Freq: Two times a day (BID) | CUTANEOUS | 0 refills | Status: DC

## 2018-03-10 MED ORDER — PREMIER PROTEIN SHAKE: 11.0000 [oz_av] | Freq: Three times a day (TID) | ORAL | 0 refills | Status: DC

## 2018-03-10 MED ORDER — ACETAMINOPHEN 325 MG PO TABS: 325.0000 mg | ORAL_TABLET | ORAL | Status: DC | PRN

## 2018-03-10 NOTE — Progress Notes (Signed)
Subjective/Complaints: Up with therapy in [place]. No new problems overnight  ROS: Patient denies fever, rash, sore throat, blurred vision, nausea, vomiting, diarrhea, cough, shortness of breath or chest pain, joint or back pain, headache, or mood change.     Objective: Vital Signs: Blood pressure 127/82, pulse 68, temperature 97.9 F (36.6 C), temperature source Oral, resp. rate 17, height 6' (1.829 m), weight 83 kg (182 lb 15.7 oz), SpO2 100 %. No results found. Results for orders placed or performed during the hospital encounter of 03/01/18 (from the past 72 hour(s))  Magnesium     Status: None   Collection Time: 03/08/18  8:45 PM  Result Value Ref Range   Magnesium 1.8 1.7 - 2.4 mg/dL    Comment: Performed at [facility], [place] [address] [place], [place] [address]     Constitutional: No distress . Vital signs reviewed. HEENT: EOMI, oral membranes moist Neck: supple Cardiovascular: RRR without murmur. No JVD    Respiratory: CTA Bilaterally without wheezes or rales. Normal effort    GI: BS +, non-tender, non-distended  Neuro:  Limited due to aphasia Motor: lUE/LLE: 5/5 proximal distal RUE/RLE; 0/5 proximal distal (unchanged) MAS: Left elbow flexors 1-2/4, right ankle and plantar flexors 1/4, toes up Musc/Skel:  No edema or tenderness in extremities Psych: pleasant and cooperative Skin: scalp staples intact   Assessment/Plan: 1. Functional deficits secondary to left MCA infarct with hemorrhagic transformation status post craniectomy which require 3+ hours per day of interdisciplinary therapy in a comprehensive inpatient rehab setting. Physiatrist is providing close team supervision and 24 hour management of active medical problems listed below. Physiatrist and rehab team continue to assess barriers to discharge/monitor patient progress toward functional and medical goals. FIM: Function - Bathing Position: Shower Body parts bathed by patient: Right arm, Chest, Abdomen,  Front perineal area, Right upper leg, Left upper leg, Left lower leg Body parts bathed by helper: Left arm, Back, Buttocks Assist Level: Touching or steadying assistance(Pt > 75%)  Function- Upper Body Dressing/Undressing What is the patient wearing?: Pull over shirt/dress Pull over shirt/dress - Perfomed by patient: Thread/unthread left sleeve, Put head through opening, Pull shirt over trunk Pull over shirt/dress - Perfomed by helper: Thread/unthread right sleeve Assist Level: Touching or steadying assistance(Pt > 75%) Function - Lower Body Dressing/Undressing What is the patient wearing?: Pants, Socks, Shoes Position: Wheelchair/chair at sink Pants- Performed by patient: Thread/unthread left pants leg, Pull pants up/down Pants- Performed by helper: Thread/unthread left pants leg Non-skid slipper socks- Performed by patient: Don/doff right sock, Don/doff left sock Non-skid slipper socks- Performed by helper: Don/doff right sock, Don/doff left sock Socks - Performed by patient: Don/doff right sock, Don/doff left sock(supine in bed) Socks - Performed by helper: Don/doff right sock, Don/doff left sock Shoes - Performed by patient: Don/doff right shoe, Don/doff left shoe(supine in bed) Shoes - Performed by helper: Don/doff right shoe, Don/doff left shoe, Fasten left, Fasten right Assist for footwear: Maximal assist Assist for lower body dressing: (max A for dynamic standing balance)  Function - Toileting Toileting activity did not occur: No continent bowel/bladder event Toileting steps completed by helper: Adjust clothing prior to toileting, Performs perineal hygiene, Adjust clothing after toileting Toileting Assistive Devices: Grab bar or rail Assist level: (total A)  Function - Air cabin crew transfer activity did not occur: Safety/medical concerns Toilet transfer assistive device: Grab bar Mechanical lift: Stedy Assist level to toilet: Maximal assist (Pt 25 - 49%/lift and  lower) Assist level from toilet: Maximal assist (  Pt 25 - 49%/lift and lower)  Function - Chair/bed transfer Chair/bed transfer activity did not occur: Safety/medical concerns Chair/bed transfer method: Stand pivot Chair/bed transfer assist level: Moderate assist (Pt 50 - 74%/lift or lower) Chair/bed transfer assistive device: Armrests Chair/bed transfer details: Verbal cues for technique, Verbal cues for precautions/safety, Manual facilitation for weight shifting, Manual facilitation for placement, Verbal cues for sequencing  Function - Locomotion: Wheelchair Will patient use wheelchair at discharge?: (tbd) Type: Manual Max wheelchair distance: 75 Assist Level: Supervision or verbal cues Assist Level: Supervision or verbal cues Assist Level: Touching or steadying assistance (Pt > 75%) Turns around,maneuvers to table,bed, and toilet,negotiates 3% grade,maneuvers on rugs and over doorsills: No Function - Locomotion: Ambulation Ambulation activity did not occur: Safety/medical concerns Assistive device: Rail in hallway Max distance: 25 Assist level: 2 helpers Assist level: 2 helpers Walk 50 feet with 2 turns activity did not occur: Safety/medical concerns Walk 150 feet activity did not occur: Safety/medical concerns Walk 10 feet on uneven surfaces activity did not occur: Safety/medical concerns  Function - Comprehension Comprehension: Auditory Comprehension assist level: Understands basic 75 - 89% of the time/ requires cueing 10 - 24% of the time  Function - Expression Expression: Verbal Expression assist level: Expresses basic 25 - 49% of the time/requires cueing 50 - 75% of the time. Uses single words/gestures.  Function - Social Interaction Social Interaction assist level: Interacts appropriately 75 - 89% of the time - Needs redirection for appropriate language or to initiate interaction.  Function - Problem Solving Problem solving assist level: Solves basic 75 - 89% of the  time/requires cueing 10 - 24% of the time  Function - Memory Memory assist level: Recognizes or recalls 25 - 49% of the time/requires cueing 50 - 75% of the time Patient normally able to recall (first 3 days only): That he or she is in a hospital   Medical Problem List and Plan: 1.  Right hemiparesis, dysphagia, and aphasia secondary to large left MCA infarct with hemorrhagic transformation s/p hemicraniectomy.  Continue CIR  Helmet for safety OOB  -timed voids to help with emptying/incontinence 2.  DVT Prophylaxis/Anticoagulation: Mechanical: Sequential compression devices, below knee Bilateral lower extremities 3. Pain Management: Tylenol as needed  -baclofen for spasms  -splint/ROM 4. Mood: Consult LCSW  for evaluation and support 5. Neuropsych: This patient is not fully capable of making decisions on his own behalf. 6. Skin/Wound Care: Routine pressure relief measures.  Monitor wound for healing.  Maintain adequate nutritional status. 7. Fluids/Electrolytes/Nutrition: Monitor I's/O's.   Encourage nectar thick fluid intake.   -recheck labs tomorrow 8. Dysphagia: Continue dysphagia 3, nectar liquids--offer fluids frequently.   -encourage fluids 9. Leucocytosis: ?Progressive and felt to be reactive--ID recommends d/c antibiotic and monitor for signs of infection.   Mother with history of CLL.  WBCs 15 7/29   -recheck labs tomorrow 10. High homocysteine levels/low folate levels/boderline low B12: On high dose folic supplement, B6 and received B12 inj 7/14  . MRHFR--heterozygous /single mutation ID.  May cause abnormal clotting 11. GERD: Continue PPI.  12. Hyponatremia  Sodium 138 on 7/29  I     LOS (Days) 9 A FACE TO FACE EVALUATION WAS PERFORMED  Meredith Staggers 03/10/2018, 9:45 AM

## 2018-03-10 NOTE — Progress Notes (Signed)
Occupational Therapy Session Note  Patient Details  Name: Xavier Villegas MRN: [id number] Date of Birth: Jul 11, 1981  Today's Date: 03/10/2018 OT Individual Time: 4917-9150 OT Individual Time Calculation (min): 57 min    Short Term Goals: Week 1:  OT Short Term Goal 1 (Week 1): Pt will perform toilet transfer with mod A to decrease level of assist with self care. OT Short Term Goal 2 (Week 1): Pt will perform toilet hygiene with mod A for standing balance.  OT Short Term Goal 3 (Week 1): Pt will perform LB dressing with mod A.  Skilled Therapeutic Interventions/Progress Updates:    1:1. Pt supine in bed with NT present. Pt with soiled linens. BP monitored throughout session as written below. Pt c/o pain in L side where bone flap is. RN alerted. Pt completes bathing and dressing with transfers using steady EOB>TTB>w/c with demonstrations cues to manage RUE prior to transfers. Pt able to don helmet with set up prior to transfers. Pt bathes at sit to stand level with HOH A to wash LUE with RUE, knee blocking and lifting A to sit to stand as OT washes buttocks and VC for sequencing. Pt dresses at sit tos tand level at sink with VC for noticing pt threading RUE into head hole and A to bring RLE into figure 4 for threading RLE. Pt requires same A as above to sit to stand for advanceing pant past hips with MOD A. Pt doffs/dons socks with touching A to maintain seated figure 4 with RLE using 1 handed technique. OT installs elastic shoe laces and pt able to don shoes in seated figure 4. Exited session with pt seated in w/c and RN administering meds  BP Supine: 117/91 Sitting w/c after shower: 131/89 Sitting w/c after shoer : 135/97  Therapy Documentation Precautions:  Precautions Precautions: Fall Precaution Comments: bone flap in L abdomen, no L side lying Restrictions Weight Bearing Restrictions: No See Function Navigator for Current Functional Status.   Therapy/Group: Individual  Therapy  Tonny Branch 03/10/2018, 8:33 AM

## 2018-03-10 NOTE — Progress Notes (Signed)
Speech Language Pathology Daily Session Note  Patient Details  Name: Xavier Villegas MRN: [id number] Date of Birth: [birth year]  Today's Date: 03/10/2018 SLP Individual Time: 9311-2162 SLP Individual Time Calculation (min): 60 min  Short Term Goals: Week 2: SLP Short Term Goal 1 (Week 2): Pt will consume trials of regular textured foods with thin liquids with supervision A cues for use of swallowing precautions and minimal overt s/s of aspiration.  SLP Short Term Goal 1 - Progress (Week 2): Updated due to goal met SLP Short Term Goal 2 (Week 2): Pt will produce vocalizations in 5 out of 10 opportunities with Mod A cues.  SLP Short Term Goal 2 - Progress (Week 2): Met SLP Short Term Goal 3 (Week 2): Pt will demonstrate selective attention in mildly distracting environment for !0 minutes with Mod A cues.  SLP Short Term Goal 4 (Week 2): Pt will complete basic, familiar tasks with Min assist for (midlly complex) functional problem solving.   SLP Short Term Goal 5 (Week 2): Pt will answer basic yes/no questions utilizing multimodal communication with 90% accuracy given min A verbal cues.  SLP Short Term Goal 6 (Week 2): Pt will locate items to the right of midline during basic, familiar tasks with supervsison assist multimodal cues.    Skilled Therapeutic Interventions: Skilled treatment session focused on dysphagia, communication and education with pt's aunt and father. SLP facilitated session by providing skilled observation of pt consuming thin liquids via cup sips with no overt aspiration. Pt with right anterior spillage but no overt s/s of aspiration. Recommend upgrade to thin liquids. Pt required Max A to demonstrate selective attention at 5 minute intervals and education provided to pt's father/aunt on nature of language deficits and apraxia. Pt was able to hum along with SLP for song by [facility]. Pt left upright in wheelchair with family present.      Function:  Eating Eating    Modified Consistency Diet: No(water with SLP) Eating Assist Level: Supervision or verbal cues   Eating Set Up Assist For: Opening containers Helper Scoops Food on Utensil: Occasionally     Cognition Comprehension Comprehension assist level: Understands basic 75 - 89% of the time/ requires cueing 10 - 24% of the time  Expression   Expression assist level: Expresses basic 25 - 49% of the time/requires cueing 50 - 75% of the time. Uses single words/gestures.  Social Interaction Social Interaction assist level: Interacts appropriately 75 - 89% of the time - Needs redirection for appropriate language or to initiate interaction.  Problem Solving Problem solving assist level: Solves basic 75 - 89% of the time/requires cueing 10 - 24% of the time  Memory Memory assist level: Recognizes or recalls 25 - 49% of the time/requires cueing 50 - 75% of the time    Pain Pain Assessment Faces Pain Scale: No hurt  Therapy/Group: Individual Therapy  Xavier Villegas 03/10/2018, 12:49 PM

## 2018-03-10 NOTE — Progress Notes (Signed)
Physical Therapy Weekly Progress Note  Patient Details  Name: Xavier Villegas MRN: [id number] Date of Birth: [birth year]  Beginning of progress report period: March 02, 2017 End of progress report period: March 10, 2018  Today's Date: 03/10/2018 PT Individual Time: 1100-1200 & 1600-1630  PT Individual Time Calculation (min): 60 min & 30 min   Patient has met 1of 4 short term goals.  Sessions have been somewhat limited due to elevated BP, refer to prior notes for detailed descriptions. Pt continues to make progress towards his functional goals including ambulating max 25 ft with max A for right knee blocking during stance, weight shifting onto R LE to facilitate L LE advancement during swing, and R stepping with proper heel strike. Pt consistently requires mod A for stand pivot transfers occasionally progressing to min A when initiated towards strong LLE. PT introduced squat pivot transfers from mat<>w/c with min guard and will continue to implement transfers into therapy sessions. Pt will continue to benefit from gait training with LRAD and assist, introducing steps, and transfers to continue progressing towards LTG's.    Patient continues to demonstrate the following deficits muscle weakness and muscle joint tightness, decreased cardiorespiratoy endurance, impaired timing and sequencing, abnormal tone, unbalanced muscle activation, motor apraxia, ataxia, decreased coordination and decreased motor planning and decreased sitting balance, decreased standing balance, decreased postural control, hemiplegia and decreased balance strategies and therefore will continue to benefit from skilled PT intervention to increase functional independence with mobility.  Patient progressing toward long term goals..  Continue plan of care.  PT Short Term Goals Week 1:  PT Short Term Goal 1 (Week 1): Pt will scan to R environment in 50% of opportunities with min cues PT Short Term Goal 1 - Progress (Week 1):  Met PT Short Term Goal 2 (Week 1): Pt will ambulate 30' with +1 assist PT Short Term Goal 2 - Progress (Week 1): Not met PT Short Term Goal 3 (Week 1): Pt will initiate stair training with PT PT Short Term Goal 3 - Progress (Week 1): Not met PT Short Term Goal 4 (Week 1): Pt will initiate stair training with PT PT Short Term Goal 4 - Progress (Week 1): Not met PT Short Term Goal 5 (Week 1): Pt will propel w/c 50' with min assist Week 2:  PT Short Term Goal 1 (Week 2): Pt will initiate stair training with PT PT Short Term Goal 2 (Week 2): Pt will initiate stair training with PT PT Short Term Goal 3 (Week 2): Pt will amb 40 feet with +1 assist and LRAD  PT Short Term Goal 4 (Week 2): Pt will propel w/c using hemi technique navigating tight door passages and obstacles with supervision for 100 feet with <10% error PT Short Term Goal 5 (Week 2): Pt will perform stand pivot transfers and squat pivot transfers consistently with min A  Skilled Therapeutic Interventions/Progress Updates:   Session 1: Pt propelled w/c from room <> gym for total of 150 feet using left hemi technique with supervision and occasional VC's for right visual scanning for safe navigation around obstacles. Pt performed x2 gait trials consisting of L hand rail for UE support and therapist providing max A to R LE for quad activation during stance, blocking to prevent knee buckling, and left weight shifting to facilitate advancement of R LE with proper step length and heel strike. Pt demonstrates occasional R ankle rolling onto lateral border with inability to verbalize any proprioceptive awareness and may benefit from a  brace to prevent ligamentous injury. BP assessed s/p gait trials to be 128/87. Pt attempted to perform R LE stepping with therapist facilitating L directed weight shift and Pt using L hand rail for support. Pt able to initiate x5 trials of stepping with therapist providing mod A for proper knee and hip flexion to allow R  LE advancement. Pt performs x2 trials of seated simple pipe tree designs with therapist providing max assistance for initial trial and Pt demonstrating good carry over fading to supervision for 2nd trial.Pt demonstrates ability to recognize errors and self correct during 2nd trial with continued practice with R visual scanning. Pt propelled w/c back to room using hemi technique and performed stand pivot transfer leading with stronger L LE and therapist providing min A. Pt performed sitting EOB> supine with min A for R LE. Pt positioned in bed with tray table and call bell in reach, family present, and all needs met.   Session 2: Pt propels w/c from room<>treatment gym for total of 150 feet using left hemi technique with supervision for cueing for R visual scan. Pt performs multiple squat pivot transfers from w/c<> treatment mat and from w/c <> more compliant mat to simulate bed surface with min guard for all trials and assistance with chair alignment. Pt BP assessed to be 133/96. Pt performs forward and lateral stepping with therapist providing max A for weight shifting to R LE and to maintain R knee blocking for advancement of L LE. Pt demonstrates 0% accuracy stepping to proper color or number during functional task. Pt performs multiple trials of stepping with seated rest breaks upon fatigue. Pt demonstrated x2 knee buckling episodes during trials. PT progressed Pt to L LE toe tapping to 3' step with max A to facilitate R weight shift for off loading and L LE advancement. Pt performed x2/5 reps L LE toe tapping with rest break in between. Pt performed squat pivot from mat> w/c min guard and Pt propelled back to room with hemi technique. Pt left sitting in w/c with tray table and call bell in reach and all needs met.  Precautions:  Precautions Precautions: Fall Precaution Comments: bone flap in L abdomen, no L side lying Restrictions Weight Bearing Restrictions: No     See Function Navigator for  Current Functional Status.  Therapy/Group: Individual Therapy  Floreen Comber 03/10/2018, 12:25 PM

## 2018-03-10 NOTE — Progress Notes (Signed)
Orthopedic Tech Progress Note Patient Details:  Joshau Code [facility] 04/24/1981 [id number]  Ortho Devices Type of Ortho Device: Ankle Air splint       [place] Pink 03/10/2018, 4:28 PM

## 2018-03-11 ENCOUNTER — Inpatient Hospital Stay (HOSPITAL_COMMUNITY): Payer: BLUE CROSS/BLUE SHIELD | Admitting: Occupational Therapy

## 2018-03-11 ENCOUNTER — Ambulatory Visit (HOSPITAL_COMMUNITY): Payer: BLUE CROSS/BLUE SHIELD | Admitting: Speech Pathology

## 2018-03-11 ENCOUNTER — Inpatient Hospital Stay (HOSPITAL_COMMUNITY): Payer: BLUE CROSS/BLUE SHIELD | Admitting: Speech Pathology

## 2018-03-11 ENCOUNTER — Inpatient Hospital Stay (HOSPITAL_COMMUNITY): Payer: BLUE CROSS/BLUE SHIELD | Admitting: Physical Therapy

## 2018-03-11 LAB — BASIC METABOLIC PANEL WITH GFR
Anion gap: 9 (ref 5–15)
BUN: 11 mg/dL (ref 6–20)
CO2: 31 mmol/L (ref 22–32)
Calcium: 9.3 mg/dL (ref 8.9–10.3)
Chloride: 99 mmol/L (ref 98–111)
Creatinine, Ser: 0.59 mg/dL — ABNORMAL LOW (ref 0.61–1.24)
GFR calc Af Amer: >60 mL/min
GFR calc non Af Amer: >60 mL/min
Glucose, Bld: 93 mg/dL (ref 70–99)
Potassium: 3.6 mmol/L (ref 3.5–5.1)
Sodium: 139 mmol/L (ref 135–145)

## 2018-03-11 LAB — CBC
HCT: 43 % (ref 39.0–52.0)
Hemoglobin: 14.7 g/dL (ref 13.0–17.0)
MCH: 35.9 pg — ABNORMAL HIGH (ref 26.0–34.0)
MCHC: 34.2 g/dL (ref 30.0–36.0)
MCV: 104.9 fL — ABNORMAL HIGH (ref 78.0–100.0)
Platelets: 581 10*3/uL — ABNORMAL HIGH (ref 150–400)
RBC: 4.1 MIL/uL — ABNORMAL LOW (ref 4.22–5.81)
RDW: 12.5 % (ref 11.5–15.5)
WBC: 10.5 10*3/uL (ref 4.0–10.5)

## 2018-03-11 MED ORDER — BACLOFEN 10 MG PO TABS
10.0000 mg | ORAL_TABLET | Freq: Three times a day (TID) | ORAL | Status: DC
  Administered 2018-03-11 – 2018-03-21 (×30): 10 mg via ORAL
  Filled 2018-03-11 (×30): qty 1

## 2018-03-11 NOTE — Progress Notes (Signed)
Occupational Therapy Weekly Progress Note  Patient Details  Name: Xavier Villegas MRN: [id number] Date of Birth: [birth year]  Beginning of progress report period: 03/02/18 End of progress report period: 03/11/18  Today's Date: 03/11/2018 OT Individual Time: 5035-4656 OT Individual Time Calculation (min): 61 min   Patient has met 1 of 3 short term goals.    Pt has made limited progress at time of report. He can complete bathroom transfers with use of Stedy or Max A stand pivot with Rt side supported.  LB dressing can also be completed with Mod A while supine in bed, however pt requires Max A when sitting up unsupported due to balance deficits. Trunk control, safety awareness, and Rt NMR are main areas of focus during OT sessions to increase functional independence. Family is often present during sessions, involved, and asking appropriate questions. Continue POC.     Patient continues to demonstrate the following deficits: muscle weakness and muscle paralysis, decreased cardiorespiratoy endurance, impaired timing and sequencing, abnormal tone, unbalanced muscle activation and decreased coordination, decreased visual perceptual skills and field cut, decreased midline orientation, decreased attention to right and right side neglect, decreased initiation, decreased attention, decreased awareness, decreased problem solving, decreased safety awareness and decreased memory and decreased sitting balance, decreased standing balance, decreased postural control, hemiplegia and decreased balance strategies and therefore will continue to benefit from skilled OT intervention to enhance overall performance with BADL.  Patient progressing toward long term goals..  Continue plan of care.  OT Short Term Goals Week 1:  OT Short Term Goal 1 (Week 1): Pt will perform toilet transfer with mod A to decrease level of assist with self care. OT Short Term Goal 1 - Progress (Week 1): Progressing toward goal OT Short  Term Goal 2 (Week 1): Pt will perform toilet hygiene with mod A for standing balance.  OT Short Term Goal 2 - Progress (Week 1): Not met OT Short Term Goal 3 (Week 1): Pt will perform LB dressing with mod A. OT Short Term Goal 3 - Progress (Week 1): Met Week 2:  OT Short Term Goal 1 (Week 2): Pt will complete toilet transfers with Mod A OT Short Term Goal 2 (Week 2): Pt will exhibit improved trunk control by completing 1/3 components of donning pants while sitting unsupported  OT Short Term Goal 3 (Week 2): Pt will complete 4/4 components of donning shirt with close supervision for balance   Skilled Therapeutic Interventions/Progress Updates:    Pt greeted supine in bed with aunt present. Shaking head when asked if he wanted to shower. Agreeable to try toileting. When pt sat EOB, bed appeared to be saturated with urine. Stand pivot<w/c<elevated toilet completed with Mod-Max A respectively with supported Rt side. Worked on Rt weightbearing when pt stood with therapist while aunt assisted with perihygiene and clothing mgt. Completed 4/4 components of donning overhead shirt with Min A for balance! Handwashing and oral care completed w/c level afterwards with vcs for Rt scanning and HOH for integrating R UE as gross stabilizer. Aunt actively participated during tx and required cues to allow pt to attempt tasks at max level of independence before doing it for him (I.e. Donning sneakers supine in bed or setting up oral care). Afterwards took pt to dayroom and worked on standing balance, midline orientation/awareness at elevated table. Mod-Max A for balance maintenance while he completed AAROM exercises with R UE in standing. Pt attending to visual feedback and was able to correct Rt lean with min vcs. Able  to stand for 4 minutes. While seated continued working on self AAROM to break up tone and increase Rt awareness. Discussed and demonstrated safe ROM exercises aunt could help facilitate in room outside of  therapies. She had been doing ROM with him in room and was lifting R UE overhead. Advised her to avoid this position to prevent subacromial impingement. Also educated her about ADL tasks she could facilitate in room to work on [place] (I.e. Applying lotion or washing hands).  At end of session pt was left in w/c with safety belt fastened and aunt present.   BP at start of session: 122/77; after 2nd toilet transfer: 129/87; after standing at elevated table: 127/90. Pt asymptomatic throughout session.   Therapy Documentation Precautions:  Precautions Precautions: Fall Precaution Comments: bone flap in L abdomen, no L side lying Restrictions Weight Bearing Restrictions: No Vital Signs: Therapy Vitals Temp: 98 F (36.7 C) Temp Source: Oral Pulse Rate: 77 Resp: 17 BP: 122/90 Patient Position (if appropriate): Sitting Oxygen Therapy SpO2: 98 % O2 Device: Room Air Pain: No s/s pain during session  Pain Assessment Pain Scale: 0-10 Pain Score: 0-No pain Faces Pain Scale: No hurt Pain Location: Leg Pain Orientation: Right;Left Pain Descriptors / Indicators: Aching Pain Intervention(s): Medication (See eMAR) Multiple Pain Sites: No ADL:      See Function Navigator for Current Functional Status.   Therapy/Group: Individual Therapy  Josseline Reddin A Mayce Noyes 03/11/2018, 3:54 PM

## 2018-03-11 NOTE — Progress Notes (Signed)
Physical Therapy Session Note  Patient Details  Name: Xavier Villegas MRN: [id number] Date of Birth: [birth year]  Today's Date: 03/11/2018 PT Individual Time: 1000-1100 PT Individual Time Calculation (min): 60 min   Short Term Goals: Week 2:  PT Short Term Goal 1 (Week 2): Pt will initiate stair training with PT PT Short Term Goal 2 (Week 2): Pt will initiate stair training with PT PT Short Term Goal 3 (Week 2): Pt will amb 40 feet with +1 assist and LRAD  PT Short Term Goal 4 (Week 2): Pt will propel w/c using hemi technique navigating tight door passages and obstacles with supervision for 100 feet with <10% error PT Short Term Goal 5 (Week 2): Pt will perform stand pivot transfers and squat pivot transfers consistently with min A  Skilled Therapeutic Interventions/Progress Updates:    Pt gestures to slight L abdomen discomfort prior to session start and therapist monitored Pt sx throughout tx duration. Pt propelled w/c from room> tx gym for 75 feet using L LE and L UE for propulsion and supervision with VC's to visually scan R side for avoiding obstacles. Pt performs squat pivot transfers from w/c<> mat consistently with min guard when transferring towards stronger L LE. Pt performs multiple trials consisting of L toe tapping to 3 inch step in standing with therapist providing max A for R sided weight shifting to off load L LE for advancement and R knee blocking with tactile cues for quad activation to prevent buckling. During initial trials, Pt demonstrated pushing towards his R with ability to self correct with max multimodal cues. During subsequent trials, Pt able to demonstrate proper lateral weight shifting but difficulty initiating R LE total knee extension requiring assist from therapist. Pt then performs 2x10 reps of seated ball kicking with feet dangling off edge of mat. Therapist activated R patellar DTR for quad activation to facilitate kicking motion with 2nd helper rolling ball.  Pt unable to activate R LE quad at this time. Pt transitions from sitting edge of mat to supine with min A for R LE and attempts to perform heel slides with maxi slide but unable to perform requiring AAROM for x5 reps. Pt ambulates 20 feet in hallway with L HR for assist and therapist providing max A to R LE for facilitating stepping, weight shifting, and occasional postural control during fatigue onset. Pt performs x1 trial of 54f of retrograde/ and 30 ft anterograde ambulation with L HR and mod A to advance and place R LE but Pt able to activate R knee extension in functional stance phase.PT propelled Pt back to room and he remained sitting in chair with chair alarm activated, all needs met, and aunt present.  Therapy Documentation Precautions:  Precautions Precautions: Fall Precaution Comments: bone flap in L abdomen, no L side lying Restrictions Weight Bearing Restrictions: No   See Function Navigator for Current Functional Status.   Therapy/Group: Individual Therapy  CFloreen Comber8/09/2017, 3:59 PM

## 2018-03-11 NOTE — Patient Care Conference (Signed)
Inpatient RehabilitationTeam Conference and Plan of Care Update Date: 03/08/2018   Time: 2:00 PM    Patient Name: Xavier Villegas      Medical Record Number: [id number]  Date of Birth: 06-May-1981 Sex: Male         Room/Bed: 4W12C/4W12C-01 Payor Info: Payor: [place] / Plan: [employer]BS OTHER / Product Type: *No Product type* /    Admitting Diagnosis: CVA  Admit Date/Time:  03/01/2018  6:01 PM Admission Comments: No comment available   Primary Diagnosis:  <principal problem not specified> Principal Problem: <principal problem not specified>  Patient Active Problem List   Diagnosis Date Noted  . Hyponatremia   . Cerebral infarction due to embolism of left middle cerebral artery (HCC) s/p thrombectomy 03/01/2018  . Carotid artery dissection ([place]) Left 03/01/2018  . Acute ischemic right MCA stroke ([place]) 03/01/2018  . Right hemiparesis ([place])   . Dysphagia, post-stroke   . Global aphasia   . Leukocytosis   . Folic acid deficiency 19/50/9326  . B12 deficiency 02/23/2018  . Hyperhomocysteinemia ([place]) 02/23/2018  . Smoker     Expected Discharge Date: Expected Discharge Date: (3 weeks)  Team Members Present: Physician leading conference: Dr. Alger Simons Social Worker Present: Alfonse Alpers, LCSW Nurse Present: Junius Creamer, RN PT Present: Dwyane Dee, PT OT Present: Willeen Cass, OT SLP Present: Charolett Bumpers, SLP PPS Coordinator present : Daiva Nakayama, RN, CRRN     Current Status/Progress Goal Weekly Team Focus  Medical   left mca infarct with craniectomy, dense right hemiparesis and aphasia, early tone  improve ability to tolerate activity with therapy  wound care, spasticity, bp   Bowel/Bladder   Straight cath In and Out Q6H, pt is incontinent of bowel  pt will be continent of bowel with mod assist, pt will be able to void  Continue to cath Q6H, toilet program for bowel. pt will have normal bowel pattern and will be continent of stool.     Swallow/Nutrition/ Hydration   Min A, dys 2 and NTL  Supervision A  water protocol , dys 2 tolerance and dys 3 trial    ADL's             Mobility   max assist overall for transfers/gait, limited last 2 days by high BP  min guard/assist overall  R attention, NMR, balance, functional mobility, cognitive remediation    Communication   Total- Max A  Mod A  nonverbal and vocalizing, spontaneous speech    Safety/Cognition/ Behavioral Observations  Mod-Min A  Min A, Sup A  basic- mildly complex problem solving, emergent awareness, selective    Pain   Pt having cramps in BLE's and RUE pt had slight headache pt has tylenol prn new order for muscle rub BID to extremtites, and one time dose of flexeril  pt will be free of pain and cramps  assess for pain qshift and prn medicate as ordered and assess for relief. Notify MD for unrelieved pain   Skin   abrasion on Right knee surgical incision RLQ abdomen with honeycomb dressing with steri strips, surgical incision head with staples OTA, MASD to buttock perianal   pt will be free from infection no further skin breakdown improvement of MASD  assess skin qshift and prn treatment as ordered    Rehab Goals Patient on target to meet rehab goals: Yes Rehab Goals Revised: none *See Care Plan and progress notes for long and short-term goals.     Barriers to Discharge  Current Status/Progress Possible Resolutions Date Resolved   Physician    Medical stability               Nursing                  PT                    OT                  SLP                SW                Discharge Planning/Teaching Needs:  Pt to return to his home with his parents and aunt to provide the recommended supervision/assist needed.  Family education to occur closer to d/c.   Team Discussion:  Pt with spasms and MD has added baclofen to assist with this.  Watching HR.  Pain is manageable besides spasms.  Pt is incontinent due to communication issues.  MD  approved waiting longer before cathing to give pt a chance to urinate on his own.  Pt is max A +2 with ADLS and BP has been high in therapy.  Pt is max overall with PT and has min A gait goals, S with transfers.  Pt upgraded to D2 diet with D3 trials later this week.  Pt started water protocol this week with ST and RN.  Pt has some spontaneous speech and his yes/no is stronger and more consistent.  Revisions to Treatment Plan:  none    Continued Need for Acute Rehabilitation Level of Care: The patient requires daily medical management by a physician with specialized training in physical medicine and rehabilitation for the following conditions: Daily direction of a multidisciplinary physical rehabilitation program to ensure safe treatment while eliciting the highest outcome that is of practical value to the patient.: Yes Daily medical management of patient stability for increased activity during participation in an intensive rehabilitation regime.: Yes Daily analysis of laboratory values and/or radiology reports with any subsequent need for medication adjustment of medical intervention for : Neurological problems  Xavier Villegas, Xavier Villegas 03/11/2018, 2:00 PM

## 2018-03-11 NOTE — Progress Notes (Signed)
Subjective/Complaints: Up in w/c. No new complaints. Says he slept well. Denies pain  ROS: limited due to language/communication    Objective: Vital Signs: Blood pressure 113/65, pulse 65, temperature 97.9 F (36.6 C), temperature source Oral, resp. rate 18, height 6' (1.829 m), weight 83 kg (182 lb 15.7 oz), SpO2 99 %. No results found. Results for orders placed or performed during the hospital encounter of 03/01/18 (from the past 72 hour(s))  Magnesium     Status: None   Collection Time: 03/08/18  8:45 PM  Result Value Ref Range   Magnesium 1.8 1.7 - 2.4 mg/dL    Comment: Performed at [facility], [place] [address], [place], [place] [address]  Basic metabolic panel     Status: Abnormal   Collection Time: 03/11/18  6:12 AM  Result Value Ref Range   Sodium 139 135 - 145 mmol/L   Potassium 3.6 3.5 - 5.1 mmol/L   Chloride 99 98 - 111 mmol/L   CO2 31 22 - 32 mmol/L   Glucose, Bld 93 70 - 99 mg/dL   BUN 11 6 - 20 mg/dL   Creatinine, Ser 0.59 (L) 0.61 - 1.24 mg/dL   Calcium 9.3 8.9 - 10.3 mg/dL   GFR calc non Af Amer >60 >60 mL/min   GFR calc Af Amer >60 >60 mL/min    Comment: (NOTE) The eGFR has been calculated using the CKD EPI equation. This calculation has not been validated in all clinical situations. eGFR's persistently <60 mL/min signify possible Chronic Kidney Disease.    Anion gap 9 5 - 15    Comment: Performed at [facility] [address] [place], [place] [address]  CBC     Status: Abnormal   Collection Time: 03/11/18  6:12 AM  Result Value Ref Range   WBC 10.5 4.0 - 10.5 K/uL   RBC 4.10 (L) 4.22 - 5.81 MIL/uL   Hemoglobin 14.7 13.0 - 17.0 g/dL   HCT 43.0 39.0 - 52.0 %   MCV 104.9 (H) 78.0 - 100.0 fL   MCH 35.9 (H) 26.0 - 34.0 pg   MCHC 34.2 30.0 - 36.0 g/dL   RDW 12.5 11.5 - 15.5 %   Platelets 581 (H) 150 - 400 K/uL    Comment: Performed at [facility] [address], [place], [place] [address]     Constitutional: No distress . Vital  signs reviewed. HEENT: EOMI, oral membranes moist Neck: supple Cardiovascular: RRR without murmur. No JVD    Respiratory: CTA Bilaterally without wheezes or rales. Normal effort    GI: BS +, non-tender, non-distended  Neuro:  Limited due to aphasia Motor: lUE/LLE: 5/5 proximal distal RUE/RLE; 0/5 proximal distal (unchanged) MAS: Left elbow and finger flexors 1-2/4, right ankle and plantar flexors tr to 1/4, toes up, DTR's 3+ Musc/Skel:  No edema or tenderness in extremities Psych: pleasant and cooperative Skin: scalp staples intact   Assessment/Plan: 1. Functional deficits secondary to left MCA infarct with hemorrhagic transformation status post craniectomy which require 3+ hours per day of interdisciplinary therapy in a comprehensive inpatient rehab setting. Physiatrist is providing close team supervision and 24 hour management of active medical problems listed below. Physiatrist and rehab team continue to assess barriers to discharge/monitor patient progress toward functional and medical goals. FIM: Function - Bathing Position: Shower Body parts bathed by patient: Right arm, Chest, Abdomen, Front perineal area, Right upper leg, Left upper leg, Left lower leg Body parts bathed by helper: Left arm,  Back, Buttocks Assist Level: Touching or steadying assistance(Pt > 75%)  Function- Upper Body Dressing/Undressing What is the patient wearing?: Pull over shirt/dress Pull over shirt/dress - Perfomed by patient: Thread/unthread left sleeve, Put head through opening, Pull shirt over trunk Pull over shirt/dress - Perfomed by helper: Thread/unthread right sleeve Assist Level: Touching or steadying assistance(Pt > 75%) Function - Lower Body Dressing/Undressing What is the patient wearing?: Pants, Socks, Shoes Position: Wheelchair/chair at sink Pants- Performed by patient: Thread/unthread left pants leg, Pull pants up/down Pants- Performed by helper: Thread/unthread left pants leg Non-skid  slipper socks- Performed by patient: Don/doff right sock, Don/doff left sock Non-skid slipper socks- Performed by helper: Don/doff right sock, Don/doff left sock Socks - Performed by patient: Don/doff right sock, Don/doff left sock(supine in bed) Socks - Performed by helper: Don/doff right sock, Don/doff left sock Shoes - Performed by patient: Don/doff right shoe, Don/doff left shoe(supine in bed) Shoes - Performed by helper: Don/doff right shoe, Don/doff left shoe, Fasten left, Fasten right Assist for footwear: Maximal assist Assist for lower body dressing: (max A for dynamic standing balance)  Function - Toileting Toileting activity did not occur: No continent bowel/bladder event Toileting steps completed by helper: Adjust clothing prior to toileting, Performs perineal hygiene, Adjust clothing after toileting Toileting Assistive Devices: Grab bar or rail Assist level: (total A)  Function - Air cabin crew transfer activity did not occur: Safety/medical concerns Toilet transfer assistive device: Grab bar Mechanical lift: Stedy Assist level to toilet: Maximal assist (Pt 25 - 49%/lift and lower) Assist level from toilet: Maximal assist (Pt 25 - 49%/lift and lower)  Function - Chair/bed transfer Chair/bed transfer activity did not occur: Safety/medical concerns Chair/bed transfer method: Stand pivot Chair/bed transfer assist level: Moderate assist (Pt 50 - 74%/lift or lower) Chair/bed transfer assistive device: Armrests Chair/bed transfer details: Verbal cues for technique, Verbal cues for precautions/safety, Manual facilitation for weight shifting, Manual facilitation for placement, Verbal cues for sequencing  Function - Locomotion: Wheelchair Will patient use wheelchair at discharge?: (tbd) Type: Manual Max wheelchair distance: 75 Assist Level: Supervision or verbal cues Assist Level: Supervision or verbal cues Assist Level: Touching or steadying assistance (Pt >  75%) Turns around,maneuvers to table,bed, and toilet,negotiates 3% grade,maneuvers on rugs and over doorsills: No Function - Locomotion: Ambulation Ambulation activity did not occur: Safety/medical concerns Assistive device: Rail in hallway Max distance: 25 Assist level: 2 helpers Assist level: 2 helpers Walk 50 feet with 2 turns activity did not occur: Safety/medical concerns Walk 150 feet activity did not occur: Safety/medical concerns Walk 10 feet on uneven surfaces activity did not occur: Safety/medical concerns  Function - Comprehension Comprehension: Auditory Comprehension assist level: Understands basic 90% of the time/cues < 10% of the time  Function - Expression Expression: Verbal, Nonverbal Expression assist level: Expresses basic 50 - 74% of the time/requires cueing 25 - 49% of the time. Needs to repeat parts of sentences.  Function - Social Interaction Social Interaction assist level: Interacts appropriately 75 - 89% of the time - Needs redirection for appropriate language or to initiate interaction.  Function - Problem Solving Problem solving assist level: Solves basic 75 - 89% of the time/requires cueing 10 - 24% of the time  Function - Memory Memory assist level: Recognizes or recalls 75 - 89% of the time/requires cueing 10 - 24% of the time Patient normally able to recall (first 3 days only): Current season, Location of own room, Staff names and faces, That he or she is in a hospital  Medical Problem List and Plan: 1.  Right hemiparesis, dysphagia, and aphasia secondary to large left MCA infarct with hemorrhagic transformation s/p hemicraniectomy.  Continue CIR  Helmet for safety OOB  -continue timed voids to help with emptying/incontinence 2.  DVT Prophylaxis/Anticoagulation: Mechanical: Sequential compression devices, below knee Bilateral lower extremities 3. Pain Management: Tylenol as needed  -baclofen for spasms--increase to 61m TID  -splint/ROM 4. Mood:  Consult LCSW  for evaluation and support 5. Neuropsych: This patient is not fully capable of making decisions on his own behalf. 6. Skin/Wound Care: Routine pressure relief measures.  Monitor wound for healing.  Maintain adequate nutritional status. 7. Fluids/Electrolytes/Nutrition: Monitor I's/O's.   Encourage nectar thick fluid intake.   -I personally reviewed all of the patient's labs today, and lab work is within normal limits.  8. Dysphagia: Continue dysphagia 3, nectar liquids--offer fluids frequently.   -encourage fluids 9. Leucocytosis: ?Progressive and felt to be reactive--ID recommends d/c antibiotic and monitor for signs of infection.   Mother with history of CLL.  WBCs 10.5 8/2 10. High homocysteine levels/low folate levels/boderline low B12: On high dose folic supplement, B6 and received B12 inj 7/14  . MRHFR--heterozygous /single mutation ID.  May cause abnormal clotting 11. GERD: Continue PPI.  12. Hyponatremia  Sodium 139 on 8/2  I     LOS (Days) 10 A FACE TO FACE EVALUATION WAS PERFORMED  ZMeredith Staggers8/09/2017, 10:33 AM

## 2018-03-11 NOTE — Progress Notes (Signed)
Speech Language Pathology Daily Session Note  Patient Details  Name: Xavier Villegas MRN: [id number] Date of Birth: 08/20/1980  Today's Date: 03/11/2018   Skilled treatment session #1 SLP Individual Time: 2751-7001 SLP Individual Time Calculation (min): 45 min   Skilled treatment session #2 SLP Individual Time: 1115-1200 SLP Individual Time Calculation (min): 45 min  Short Term Goals: Week 2: SLP Short Term Goal 1 (Week 2): Pt will consume trials of regular textured foods with thin liquids with supervision A cues for use of swallowing precautions and minimal overt s/s of aspiration.  SLP Short Term Goal 1 - Progress (Week 2): Updated due to goal met SLP Short Term Goal 2 (Week 2): Pt will produce vocalizations in 5 out of 10 opportunities with Mod A cues.  SLP Short Term Goal 2 - Progress (Week 2): Met SLP Short Term Goal 3 (Week 2): Pt will demonstrate selective attention in mildly distracting environment for 30 minutes with Min A cues.  SLP Short Term Goal 3 - Progress (Week 2): Updated due to goal met SLP Short Term Goal 4 (Week 2): Pt will complete mildly complex problem solving tasks with Min A cues.  SLP Short Term Goal 4 - Progress (Week 2): Updated due to goal met SLP Short Term Goal 5 (Week 2): Pt will utilize multimodal communication to express wants/needs with Mod A cues in 8 out of 10 opportunities.  SLP Short Term Goal 5 - Progress (Week 2): Updated due to goal met SLP Short Term Goal 6 (Week 2): Pt will locate items to the right of midline during basic, familiar tasks with supervsison assist multimodal cues.    Skilled Therapeutic Interventions:  Skilled treatment session #1 focused on dysphagia and communication goals. SLP facilitated session by providing skilled observation of pt consuming regular breakfast tray with thin liquids via cup. Pt required Min A to initiate any requests for help with set-up (such as applying syrup etc). With question cues, pt able to use  left hand to visually track to right of tray and locate items (milk) - however he didn't initiate putting milk into cereal until cued by SLP. Pt consumed thin liquids with Min amounts of right anterior spillage. No overt s/s of aspiration. Pt also demonstrated ability to follow  1 step directions but with 2 step directions he requires visual and verbal to reduce limb apraxia. Pt is able to demonstrate reading comprehension at the phrase level and with Max A cues able to spell name using letter board. Two designs of letter boards presented (alphabetical order and keyboard) with neither one more effective than another. When singing to "Take it Easy" pt able to form ~ 50% of words and sounds within song. Pt was returned to room, left upright in wheelchair with aunt present.   Skilled treatment session #2 focused on dysphagia, communication and education with Aunt. SLP facilitated session by providing skilled observation of pt consuming regular lunch tray with thin liquids. Pt with no overt s/s of aspiration despite moderately distracting environment. Pt also able to demonstrate selective attention for ~ 45 minutes with Mod I. Pt able to answer yes/no with Mod I and ~ 90% accuracy. Education provided to pt to gesture, point etc to communicate need for help with opening containers. Using [employer] pt able to verbalize "How are you, I am fine" and label several objects. Education provided to [employer] on how to facilitate language by singing list of favorite songs etc. Upgrade diet to regular.  Function:  Eating Eating   Modified Consistency Diet: No Eating Assist Level: Supervision or verbal cues   Eating Set Up Assist For: Opening containers       Cognition Comprehension Comprehension assist level: Understands basic 90% of the time/cues < 10% of the time  Expression   Expression assist level: Expresses basic 50 - 74% of the time/requires cueing 25 - 49% of the time. Needs to repeat parts of sentences.   Social Interaction Social Interaction assist level: Interacts appropriately 75 - 89% of the time - Needs redirection for appropriate language or to initiate interaction.  Problem Solving Problem solving assist level: Solves basic 75 - 89% of the time/requires cueing 10 - 24% of the time  Memory Memory assist level: Recognizes or recalls 75 - 89% of the time/requires cueing 10 - 24% of the time    Pain    Therapy/Group: Individual Therapy  Ajane Novella 03/11/2018, 12:18 PM

## 2018-03-11 NOTE — Progress Notes (Signed)
Social Work Patient ID: Xavier Villegas, male   DOB: [birth year], 37 y.o.   MRN: [id number]   CSW met with pt, pt's aunt, and pt's father to update them on team conference discussion and targeted LOS of 3 more weeks.  Explained that we would do family education closer to d/c and they felt good about that.  Father had knee replacement surgery on 03-07-18 and is off for about 6 weeks.  CSW will continue to follow and assist as needed.

## 2018-03-11 NOTE — Progress Notes (Signed)
Dressing to abdomen remains clean and intact

## 2018-03-12 ENCOUNTER — Inpatient Hospital Stay (HOSPITAL_COMMUNITY): Payer: BLUE CROSS/BLUE SHIELD | Admitting: Physical Therapy

## 2018-03-12 ENCOUNTER — Inpatient Hospital Stay (HOSPITAL_COMMUNITY): Payer: BLUE CROSS/BLUE SHIELD | Admitting: Occupational Therapy

## 2018-03-12 ENCOUNTER — Inpatient Hospital Stay (HOSPITAL_COMMUNITY): Payer: BLUE CROSS/BLUE SHIELD

## 2018-03-12 NOTE — Progress Notes (Signed)
Subjective/Complaints: Patient slept fairly well.  Still having some urine retention but also incontinent  ROS: limited due to language/communication   Objective: Vital Signs: Blood pressure 111/67, pulse 64, temperature 97.6 F (36.4 C), temperature source Oral, resp. rate 20, height 6' (1.829 m), weight 83 kg (182 lb 15.7 oz), SpO2 99 %. No results found. Results for orders placed or performed during the hospital encounter of 03/01/18 (from the past 72 hour(s))  Basic metabolic panel     Status: Abnormal   Collection Time: 03/11/18  6:12 AM  Result Value Ref Range   Sodium 139 135 - 145 mmol/L   Potassium 3.6 3.5 - 5.1 mmol/L   Chloride 99 98 - 111 mmol/L   CO2 31 22 - 32 mmol/L   Glucose, Bld 93 70 - 99 mg/dL   BUN 11 6 - 20 mg/dL   Creatinine, Ser 0.59 (L) 0.61 - 1.24 mg/dL   Calcium 9.3 8.9 - 10.3 mg/dL   GFR calc non Af Amer >60 >60 mL/min   GFR calc Af Amer >60 >60 mL/min    Comment: (NOTE) The eGFR has been calculated using the CKD EPI equation. This calculation has not been validated in all clinical situations. eGFR's persistently <60 mL/min signify possible Chronic Kidney Disease.    Anion gap 9 5 - 15    Comment: Performed at [facility] [address] [place], [place] [address]  CBC     Status: Abnormal   Collection Time: 03/11/18  6:12 AM  Result Value Ref Range   WBC 10.5 4.0 - 10.5 K/uL   RBC 4.10 (L) 4.22 - 5.81 MIL/uL   Hemoglobin 14.7 13.0 - 17.0 g/dL   HCT 43.0 39.0 - 52.0 %   MCV 104.9 (H) 78.0 - 100.0 fL   MCH 35.9 (H) 26.0 - 34.0 pg   MCHC 34.2 30.0 - 36.0 g/dL   RDW 12.5 11.5 - 15.5 %   Platelets 581 (H) 150 - 400 K/uL    Comment: Performed at [facility] [address], [place], [place] [address]     Constitutional: No distress . Vital signs reviewed. HEENT: EOMI, oral membranes moist Neck: supple Cardiovascular: RRR without murmur. No JVD    Respiratory: CTA Bilaterally without wheezes or rales. Normal effort    GI: BS +,  non-tender, non-distended  Neuro:  Limited due to aphasia Motor: lUE/LLE: 5/5 proximal distal RUE/RLE; 0/5 proximal distal (unchanged) MAS: Left elbow and finger flexors 1-2/4, right ankle and plantar flexors tr to 1/4, toes up, DTR's 3+ Musc/Skel:  No edema or tenderness in extremities Psych: pleasant and cooperative Skin: scalp staples intact   Assessment/Plan: 1. Functional deficits secondary to left MCA infarct with hemorrhagic transformation status post craniectomy which require 3+ hours per day of interdisciplinary therapy in a comprehensive inpatient rehab setting. Physiatrist is providing close team supervision and 24 hour management of active medical problems listed below. Physiatrist and rehab team continue to assess barriers to discharge/monitor patient progress toward functional and medical goals. FIM: Function - Bathing Bathing activity did not occur: Refused Position: Shower Body parts bathed by patient: Right arm, Chest, Abdomen, Front perineal area, Right upper leg, Left upper leg, Left lower leg Body parts bathed by helper: Left arm, Back, Buttocks Assist Level: Touching or steadying assistance(Pt > 75%)  Function- Upper Body Dressing/Undressing What is the patient wearing?: Pull over shirt/dress Pull over shirt/dress - Perfomed by patient: Thread/unthread left sleeve, Put head through opening, Pull shirt over  trunk, Thread/unthread right sleeve Pull over shirt/dress - Perfomed by helper: Thread/unthread right sleeve Assist Level: Touching or steadying assistance(Pt > 75%)(for balance) Function - Lower Body Dressing/Undressing What is the patient wearing?: Pants Position: Other (comment)(sitting on toilet) Pants- Performed by patient: Thread/unthread left pants leg, Pull pants up/down Pants- Performed by helper: Thread/unthread left pants leg, Thread/unthread right pants leg, Pull pants up/down Non-skid slipper socks- Performed by patient: Don/doff right sock,  Don/doff left sock Non-skid slipper socks- Performed by helper: Don/doff right sock, Don/doff left sock Socks - Performed by patient: Don/doff right sock, Don/doff left sock(supine in bed) Socks - Performed by helper: Don/doff right sock, Don/doff left sock Shoes - Performed by patient: Don/doff right shoe, Don/doff left shoe(supine in bed) Shoes - Performed by helper: Don/doff right shoe, Don/doff left shoe, Fasten left, Fasten right Assist for footwear: Maximal assist Assist for lower body dressing: 2 Helpers(sit<stand from elevated toilet)  Function - Toileting Toileting activity did not occur: No continent bowel/bladder event Toileting steps completed by helper: Adjust clothing prior to toileting, Performs perineal hygiene, Adjust clothing after toileting Toileting Assistive Devices: Grab bar or rail Assist level: Two helpers  Function - Air cabin crew transfer activity did not occur: Safety/medical concerns Toilet transfer assistive device: Elevated toilet seat/BSC over toilet Mechanical lift: Stedy Assist level to toilet: Maximal assist (Pt 25 - 49%/lift and lower) Assist level from toilet: Maximal assist (Pt 25 - 49%/lift and lower)  Function - Chair/bed transfer Chair/bed transfer activity did not occur: Safety/medical concerns Chair/bed transfer method: Lateral scoot Chair/bed transfer assist level: Touching or steadying assistance (Pt > 75%) Chair/bed transfer assistive device: Armrests Chair/bed transfer details: Verbal cues for technique, Verbal cues for precautions/safety, Manual facilitation for placement, Verbal cues for sequencing, Tactile cues for initiation, Tactile cues for placement  Function - Locomotion: Wheelchair Will patient use wheelchair at discharge?: (tbd) Type: Manual Max wheelchair distance: 75 Assist Level: Supervision or verbal cues Assist Level: Supervision or verbal cues Assist Level: Touching or steadying assistance (Pt > 75%) Turns  around,maneuvers to table,bed, and toilet,negotiates 3% grade,maneuvers on rugs and over doorsills: No Function - Locomotion: Ambulation Ambulation activity did not occur: Safety/medical concerns Assistive device: Rail in hallway Max distance: 20 Assist level: 2 helpers(2nd helper providing w/c follow) Assist level: 2 helpers Walk 50 feet with 2 turns activity did not occur: Safety/medical concerns Walk 150 feet activity did not occur: Safety/medical concerns Walk 10 feet on uneven surfaces activity did not occur: Safety/medical concerns  Function - Comprehension Comprehension: Auditory Comprehension assist level: Understands basic 90% of the time/cues < 10% of the time  Function - Expression Expression: Verbal, Nonverbal Expression assist level: Expresses basic 50 - 74% of the time/requires cueing 25 - 49% of the time. Needs to repeat parts of sentences.  Function - Social Interaction Social Interaction assist level: Interacts appropriately 75 - 89% of the time - Needs redirection for appropriate language or to initiate interaction.  Function - Problem Solving Problem solving assist level: Solves basic 75 - 89% of the time/requires cueing 10 - 24% of the time  Function - Memory Memory assist level: Recognizes or recalls 75 - 89% of the time/requires cueing 10 - 24% of the time Patient normally able to recall (first 3 days only): Current season, Location of own room, Staff names and faces, That he or she is in a hospital   Medical Problem List and Plan: 1.  Right hemiparesis, dysphagia, and aphasia secondary to large left MCA infarct with hemorrhagic  transformation s/p hemicraniectomy.  Continue CIR  Helmet for safety OOB  -continue timed voids to help with emptying/incontinence 2.  DVT Prophylaxis/Anticoagulation: Mechanical: Sequential compression devices, below knee Bilateral lower extremities 3. Pain Management: Tylenol as needed  -baclofen for spasms--increased to 4m  TID  -splint/ROM 4. Mood: Consult LCSW  for evaluation and support 5. Neuropsych: This patient is not fully capable of making decisions on his own behalf. 6. Skin/Wound Care: Routine pressure relief measures.  Monitor wound for healing.  Maintain adequate nutritional status. 7. Fluids/Electrolytes/Nutrition: Monitor I's/O's.   Encourage nectar thick fluid intake.     8. Dysphagia: Continue dysphagia 3, nectar liquids--offer fluids frequently.   -encourage fluids 9. Leucocytosis: ?Progressive and felt to be reactive--ID recommends d/c antibiotic and monitor for signs of infection.   Mother with history of CLL.  WBCs 10.5 8/2 10. High homocysteine levels/low folate levels/boderline low B12: On high dose folic supplement, B6 and received B12 inj 7/14  . MRHFR--heterozygous /single mutation ID.  May cause abnormal clotting 11. GERD: Continue PPI.  12. Hyponatremia  Sodium 139 on 8/2 13. Neurogenic bladder:  -incontinent with mild retention  -continue bladder training, up to toilet  -no meds at this time, recent ucx neg   LOS (Days) 11 A FACE TO FHawkins8/10/2017, 7:59 AM

## 2018-03-12 NOTE — Progress Notes (Signed)
Occupational Therapy Session Note  Patient Details  Name: Xavier Villegas MRN: [id number] Date of Birth: 01-Aug-1981  Today's Date: 03/12/2018 OT Individual Time: 0900-1000 OT Individual Time Calculation (min): 60 min    Short Term Goals: Week 2:  OT Short Term Goal 1 (Week 2): Pt will complete toilet transfers with Mod A OT Short Term Goal 2 (Week 2): Pt will exhibit improved trunk control by completing 1/3 components of donning pants while sitting unsupported  OT Short Term Goal 3 (Week 2): Pt will complete 4/4 components of donning shirt with close supervision for balance   Skilled Therapeutic Interventions/Progress Updates:    1:1 Pt in bed when arrived. Performed mod A transfer to the right into w/c and then to the toilet in bathroom. Sit to stands with min A and mod to maintain balance during LB dressing (due to no UE support during activity). Unable to void on toilet. Dressed sitting on toilet. Pt demonstrated recall of hemi dressing technique for donning shirt with minimal cues. Pt did require A to cross right LE over left to help him thread shorts. Pt able to pull them up in standing with mod A to maintain balance. Noted pt's right knee does turn on in standing but not able to sustain activation. Transitioned to the gym in and over to mat with mod A and transitioned in supine. Trial of use of NMES on right bicep to help elicit activation with increased attention to right UE. Pt with notable tone in UE but with command and tactile cues able to relax flexor mm in UE into shoulder abduction and elbow extension. With a response delay pt able to flex bicep with NMES 50% of the time.   1:1 NMES applied to bicep to elicit activation and attention to right UE.   Ratio 1:3 Rate 35 pps Waveform- Asymmetric Ramp 1.0 Pulse 300 Intensity- 22-23 Pt could feel this 50% of the time Duration -   12 min  Report of pain at the beginning of session  none Report of pain at the end of session  none  No adverse reactions after treatment and is skin intact.    Therapy Documentation Precautions:  Precautions Precautions: Fall Precaution Comments: bone flap in L abdomen, no L side lying Restrictions Weight Bearing Restrictions: No General:   Vital Signs:   Pain: No c/o pain in session  See Function Navigator for Current Functional Status.   Therapy/Group: Individual Therapy  Willeen Cass [facility] 03/12/2018, 12:10 PM

## 2018-03-12 NOTE — Progress Notes (Signed)
Speech Language Pathology Daily Session Note  Patient Details  Name: Chigozie Basaldua MRN: [id number] Date of Birth: 1981/06/07  Today's Date: 03/12/2018 SLP Individual Time: 0800-0830 SLP Individual Time Calculation (min): 30 min  Short Term Goals: Week 2: SLP Short Term Goal 1 (Week 2): Pt will consume trials of regular textured foods with thin liquids with supervision A cues for use of swallowing precautions and minimal overt s/s of aspiration.  SLP Short Term Goal 1 - Progress (Week 2): Updated due to goal met SLP Short Term Goal 2 (Week 2): Pt will produce vocalizations in 5 out of 10 opportunities with Mod A cues.  SLP Short Term Goal 2 - Progress (Week 2): Met SLP Short Term Goal 3 (Week 2): Pt will demonstrate selective attention in mildly distracting environment for 30 minutes with Min A cues.  SLP Short Term Goal 3 - Progress (Week 2): Updated due to goal met SLP Short Term Goal 4 (Week 2): Pt will complete mildly complex problem solving tasks with Min A cues.  SLP Short Term Goal 4 - Progress (Week 2): Updated due to goal met SLP Short Term Goal 5 (Week 2): Pt will utilize multimodal communication to express wants/needs with Mod A cues in 8 out of 10 opportunities.  SLP Short Term Goal 5 - Progress (Week 2): Updated due to goal met SLP Short Term Goal 6 (Week 2): Pt will locate items to the right of midline during basic, familiar tasks with supervsison assist multimodal cues.    Skilled Therapeutic Interventions:Skilled ST services focused on speech skills, swallow skills and family education. SLP facilitated verbal expression utilizing common phrases with MIT, pt required max A verbal cues fading to min A verbal cues to express "how are you?", however perseverated on phrase with following attempts. Pt's girlfriend and family members entered room, SLP provided education on communication boards, left in room and use of MIT to elicit expression, she stated understanding. SLP  provided radio from recreational therapy to aid in expression with familia songs, over the weekend. Pt demonstrated ability to swallow medication whole with thin liquid via cup, no overt s/s aspiration/impairment noted. Pt was left in room with call bell within reach and bed alaram set. ST reccomends to continue skilled ST services.     Function:  Eating Eating   Modified Consistency Diet: No Eating Assist Level: Supervision or verbal cues           Cognition Comprehension Comprehension assist level: Understands basic 90% of the time/cues < 10% of the time  Expression   Expression assist level: Expresses basic 50 - 74% of the time/requires cueing 25 - 49% of the time. Needs to repeat parts of sentences.  Social Interaction Social Interaction assist level: Interacts appropriately 75 - 89% of the time - Needs redirection for appropriate language or to initiate interaction.  Problem Solving Problem solving assist level: Solves basic 75 - 89% of the time/requires cueing 10 - 24% of the time  Memory Memory assist level: Recognizes or recalls 75 - 89% of the time/requires cueing 10 - 24% of the time    Pain Pain Assessment Pain Scale: 0-10 Pain Score: 0-No pain  Therapy/Group: Individual Therapy  Phillips Goulette  [facility] 03/12/2018, 4:07 PM

## 2018-03-12 NOTE — Progress Notes (Signed)
Physical Therapy Session Note  Patient Details  Name: Xavier Villegas MRN: [id number] Date of Birth: [birth year]  Today's Date: 03/12/2018 PT Individual Time: 1030-1100 PT Individual Time Calculation (min): 30 min   Short Term Goals: Week 1:  PT Short Term Goal 1 (Week 1): Pt will scan to R environment in 50% of opportunities with min cues PT Short Term Goal 1 - Progress (Week 1): Met PT Short Term Goal 2 (Week 1): Pt will ambulate 30' with +1 assist PT Short Term Goal 2 - Progress (Week 1): Not met PT Short Term Goal 3 (Week 1): Pt will initiate stair training with PT PT Short Term Goal 3 - Progress (Week 1): Not met PT Short Term Goal 4 (Week 1): Pt will initiate stair training with PT PT Short Term Goal 4 - Progress (Week 1): Not met PT Short Term Goal 5 (Week 1): Pt will propel w/c 50' with min assist  Skilled Therapeutic Interventions/Progress Updates:   Pt was seen bedside in the am. Pt transferred sit to stand multiple times with min A and verbal cues. Pt completed stand pivot transfer to left side with min A and verbal cues. Pt ambulated 12 feet x 2 and 70 feet with hemiwalker and mod to max A with verbal cues and physical assist to advance R LE and weight shift to L during swing phase on R. Pt returned to room following treatment and left sitting up in w/c with family at bedside.   Therapy Documentation Precautions:  Precautions Precautions: Fall Precaution Comments: bone flap in L abdomen, no L side lying Restrictions Weight Bearing Restrictions: No General:   Pain: No c/o pain.   See Function Navigator for Current Functional Status.   Therapy/Group: Individual Therapy  Dub Amis 03/12/2018, 12:25 PM

## 2018-03-12 NOTE — Progress Notes (Signed)
Pt voiding at times large amounts, incontinent, bladder scans have been less than 365ml, scan pt now at 278, unable to void at this time.

## 2018-03-13 ENCOUNTER — Inpatient Hospital Stay (HOSPITAL_COMMUNITY): Payer: BLUE CROSS/BLUE SHIELD | Admitting: Physical Therapy

## 2018-03-13 ENCOUNTER — Inpatient Hospital Stay (HOSPITAL_COMMUNITY): Payer: BLUE CROSS/BLUE SHIELD | Admitting: Occupational Therapy

## 2018-03-13 NOTE — ED Provider Notes (Signed)
See H& P.

## 2018-03-13 NOTE — Progress Notes (Signed)
Subjective/Complaints: Patient sleeping well when I entered.  No new complaints.  ROS: limited due to language/communication   Objective: Vital Signs: Blood pressure 130/77, pulse 70, temperature 97.8 F (36.6 C), temperature source Oral, resp. rate 18, height 6' (1.829 m), weight 83 kg (182 lb 15.7 oz), SpO2 100 %. No results found. Results for orders placed or performed during the hospital encounter of 03/01/18 (from the past 72 hour(s))  Basic metabolic panel     Status: Abnormal   Collection Time: 03/11/18  6:12 AM  Result Value Ref Range   Sodium 139 135 - 145 mmol/L   Potassium 3.6 3.5 - 5.1 mmol/L   Chloride 99 98 - 111 mmol/L   CO2 31 22 - 32 mmol/L   Glucose, Bld 93 70 - 99 mg/dL   BUN 11 6 - 20 mg/dL   Creatinine, Ser 0.59 (L) 0.61 - 1.24 mg/dL   Calcium 9.3 8.9 - 10.3 mg/dL   GFR calc non Af Amer >60 >60 mL/min   GFR calc Af Amer >60 >60 mL/min    Comment: (NOTE) The eGFR has been calculated using the CKD EPI equation. This calculation has not been validated in all clinical situations. eGFR's persistently <60 mL/min signify possible Chronic Kidney Disease.    Anion gap 9 5 - 15    Comment: Performed at [facility] [address], [place], [place] [address]  CBC     Status: Abnormal   Collection Time: 03/11/18  6:12 AM  Result Value Ref Range   WBC 10.5 4.0 - 10.5 K/uL   RBC 4.10 (L) 4.22 - 5.81 MIL/uL   Hemoglobin 14.7 13.0 - 17.0 g/dL   HCT 43.0 39.0 - 52.0 %   MCV 104.9 (H) 78.0 - 100.0 fL   MCH 35.9 (H) 26.0 - 34.0 pg   MCHC 34.2 30.0 - 36.0 g/dL   RDW 12.5 11.5 - 15.5 %   Platelets 581 (H) 150 - 400 K/uL    Comment: Performed at [facility] [address] [place], [place] [address]     Constitutional: No distress . Vital signs reviewed. HEENT: EOMI, oral membranes moist Neck: supple Cardiovascular: RRR without murmur. No JVD    Respiratory: CTA Bilaterally without wheezes or rales. Normal effort    GI: BS +, non-tender,  non-distended  Neuro:  Limited due to aphasia Motor: lUE/LLE: 5/5 proximal distal RUE/RLE; 0/5 proximal distal (unchanged) MAS: Left elbow and finger flexors 1-2/4, right ankle and plantar flexors tr to 1/4, toes up, DTR's 3+ Musc/Skel:  No edema or tenderness in extremities Psych: pleasant and cooperative Skin: scalp staples intact   Assessment/Plan: 1. Functional deficits secondary to left MCA infarct with hemorrhagic transformation status post craniectomy which require 3+ hours per day of interdisciplinary therapy in a comprehensive inpatient rehab setting. Physiatrist is providing close team supervision and 24 hour management of active medical problems listed below. Physiatrist and rehab team continue to assess barriers to discharge/monitor patient progress toward functional and medical goals. FIM: Function - Bathing Bathing activity did not occur: Refused Position: Shower Body parts bathed by patient: Right arm, Chest, Abdomen, Front perineal area, Right upper leg, Left upper leg, Left lower leg Body parts bathed by helper: Left arm, Back, Buttocks Assist Level: Touching or steadying assistance(Pt > 75%)  Function- Upper Body Dressing/Undressing What is the patient wearing?: Pull over shirt/dress Pull over shirt/dress - Perfomed by patient: Thread/unthread left sleeve, Put head through opening, Pull shirt over trunk, Thread/unthread right  sleeve Pull over shirt/dress - Perfomed by helper: Thread/unthread right sleeve Assist Level: Touching or steadying assistance(Pt > 75%)(for balance) Function - Lower Body Dressing/Undressing What is the patient wearing?: Pants Position: Other (comment)(sitting on toilet) Pants- Performed by patient: Thread/unthread left pants leg, Pull pants up/down Pants- Performed by helper: Thread/unthread left pants leg, Thread/unthread right pants leg, Pull pants up/down Non-skid slipper socks- Performed by patient: Don/doff right sock, Don/doff left  sock Non-skid slipper socks- Performed by helper: Don/doff right sock, Don/doff left sock Socks - Performed by patient: Don/doff right sock, Don/doff left sock(supine in bed) Socks - Performed by helper: Don/doff right sock, Don/doff left sock Shoes - Performed by patient: Don/doff right shoe, Don/doff left shoe(supine in bed) Shoes - Performed by helper: Don/doff right shoe, Don/doff left shoe, Fasten left, Fasten right Assist for footwear: Maximal assist Assist for lower body dressing: 2 Helpers(sit<stand from elevated toilet)  Function - Toileting Toileting activity did not occur: No continent bowel/bladder event Toileting steps completed by helper: Adjust clothing prior to toileting, Performs perineal hygiene, Adjust clothing after toileting Toileting Assistive Devices: Grab bar or rail Assist level: Two helpers  Function - Air cabin crew transfer activity did not occur: Safety/medical concerns Toilet transfer assistive device: Elevated toilet seat/BSC over toilet Mechanical lift: Stedy Assist level to toilet: Maximal assist (Pt 25 - 49%/lift and lower) Assist level from toilet: Maximal assist (Pt 25 - 49%/lift and lower)  Function - Chair/bed transfer Chair/bed transfer activity did not occur: Safety/medical concerns Chair/bed transfer method: Stand pivot Chair/bed transfer assist level: Touching or steadying assistance (Pt > 75%) Chair/bed transfer assistive device: Armrests Chair/bed transfer details: Verbal cues for technique, Verbal cues for precautions/safety, Manual facilitation for placement, Verbal cues for sequencing, Tactile cues for initiation, Tactile cues for placement  Function - Locomotion: Wheelchair Will patient use wheelchair at discharge?: (tbd) Type: Manual Max wheelchair distance: 75 Assist Level: Supervision or verbal cues Assist Level: Supervision or verbal cues Assist Level: Touching or steadying assistance (Pt > 75%) Turns around,maneuvers to  table,bed, and toilet,negotiates 3% grade,maneuvers on rugs and over doorsills: No Function - Locomotion: Ambulation Ambulation activity did not occur: Safety/medical concerns Assistive device: Orthosis, Walker-hemi Max distance: 70 Assist level: Maximal assist (Pt 25 - 49%) Assist level: Maximal assist (Pt 25 - 49%) Walk 50 feet with 2 turns activity did not occur: Safety/medical concerns Assist level: Maximal assist (Pt 25 - 49%) Walk 150 feet activity did not occur: Safety/medical concerns Walk 10 feet on uneven surfaces activity did not occur: Safety/medical concerns  Function - Comprehension Comprehension: Auditory Comprehension assist level: Understands basic 90% of the time/cues < 10% of the time  Function - Expression Expression: Verbal Expression assist level: Expresses basic 50 - 74% of the time/requires cueing 25 - 49% of the time. Needs to repeat parts of sentences.  Function - Social Interaction Social Interaction assist level: Interacts appropriately 75 - 89% of the time - Needs redirection for appropriate language or to initiate interaction.  Function - Problem Solving Problem solving assist level: Solves basic 75 - 89% of the time/requires cueing 10 - 24% of the time  Function - Memory Memory assist level: Recognizes or recalls 75 - 89% of the time/requires cueing 10 - 24% of the time Patient normally able to recall (first 3 days only): Current season, Location of own room, Staff names and faces, That he or she is in a hospital   Medical Problem List and Plan: 1.  Right hemiparesis, dysphagia, and aphasia secondary  to large left MCA infarct with hemorrhagic transformation s/p hemicraniectomy.  Continue CIR  Helmet for safety OOB    2.  DVT Prophylaxis/Anticoagulation: Mechanical: Sequential compression devices, below knee Bilateral lower extremities 3. Pain Management: Tylenol as needed  -baclofen for spasms--increased to 74m TID  -splint/ROM 4. Mood: Consult  LCSW  for evaluation and support 5. Neuropsych: This patient is not fully capable of making decisions on his own behalf. 6. Skin/Wound Care: Routine pressure relief measures.  Monitor wound for healing.  Maintain adequate nutritional status. 7. Fluids/Electrolytes/Nutrition: Monitor I's/O's.   Encourage nectar thick fluid intake.     8. Dysphagia: Continue dysphagia 3, nectar liquids--    -encourage fluids 9. Leucocytosis: ?Progressive and felt to be reactive--ID recommends d/c antibiotic and monitor for signs of infection.   Mother with history of CLL.  WBCs 10.5 8/2--recheck this week 10. High homocysteine levels/low folate levels/boderline low B12: On high dose folic supplement, B6 and received B12 inj 7/14  . MRHFR--heterozygous /single mutation ID.  May cause abnormal clotting 11. GERD: Continue PPI.  12. Hyponatremia  Sodium 139 on 8/2 13. Neurogenic bladder:  -incontinent with retention improving  -continue bladder training, up to toilet  -no meds at this time, recent ucx neg   LOS (Days) 12 A FACE TO FACE EVALUATION WAS PERFORMED  ZMeredith Staggers8/11/2017, 7:46 AM

## 2018-03-13 NOTE — Progress Notes (Signed)
Physical Therapy Session Note  Patient Details  Name: Xavier Villegas MRN: [id number] Date of Birth: 1980-10-23  Today's Date: 03/13/2018 PT Individual Time: 1100-1200 PT Individual Time Calculation (min): 60 min   Short Term Goals: Week 2:  PT Short Term Goal 1 (Week 2): Pt will initiate stair training with PT PT Short Term Goal 2 (Week 2): Pt will initiate stair training with PT PT Short Term Goal 3 (Week 2): Pt will amb 40 feet with +1 assist and LRAD  PT Short Term Goal 4 (Week 2): Pt will propel w/c using hemi technique navigating tight door passages and obstacles with supervision for 100 feet with <10% error PT Short Term Goal 5 (Week 2): Pt will perform stand pivot transfers and squat pivot transfers consistently with min A  Skilled Therapeutic Interventions/Progress Updates:    no c/o pain at start of session, 1-2/10 on VAS during treatment.  Session focus on activity tolerance, safety awareness, and NMR for RLE.    Pt dons shoes in bed with set up assist, and assist to place RLE in fig-4 position.  Supine>sit with min guard for safety, pt using HOB elevated and bedrail.  Stand/pivot throughout session to R/L with min assist, squat/pivot throughout session to R/L with min guard.   NMR in standing with 2x10 reps minisquats and 3x10 reps LLE toe taps to 2" step focus on RLE forced weight bearing, forced use, weight shift, and upright posture.  PT facilitating at R quad 25% of the time with max multimodal cues for R quad activation during stance phase.    NMR via ambulation forward/retro stepping at rail in hallway x2 forward, x1 backwards, with mod assist overall, facilitation for R weight shift for stance phase, and RLE advancing/placement in swing phase.  Pt able to stabilize RLE in stance phase 90% of trials forward and backward.    NMR/cognitive remediation via nustep at level 3 with BLEs and LUE for reciprocal stepping pattern retraining, forced use of RLE, and attention to  timer, 3 trials of 3.5 minutes each with mod cues to stop task at appropriate time.    Pt returned to room at end of session and positioned upright in w/c with chair alarm intact, call bell in reach and needs met.   Therapy Documentation Precautions:  Precautions Precautions: Fall Precaution Comments: bone flap in L abdomen, no L side lying Restrictions Weight Bearing Restrictions: No  See Function Navigator for Current Functional Status.   Therapy/Group: Individual Therapy  Michel Santee 03/13/2018, 8:59 AM

## 2018-03-13 NOTE — Progress Notes (Addendum)
Occupational Therapy Session Note  Patient Details  Name: Xavier Villegas MRN: [id number] Date of Birth: 01/12/1981  Today's Date: 03/13/2018 OT Individual Time: 5456-2563 OT Individual Time Calculation (min): 86 min   Short Term Goals: Week 2:  OT Short Term Goal 1 (Week 2): Pt will complete toilet transfers with Mod A OT Short Term Goal 2 (Week 2): Pt will exhibit improved trunk control by completing 1/3 components of donning pants while sitting unsupported  OT Short Term Goal 3 (Week 2): Pt will complete 4/4 components of donning shirt with close supervision for balance   Skilled Therapeutic Interventions/Progress Updates:    Pt greeted in w/c with multiple visitors present. He donned helmet with setup. Stand pivot transfer<elevated toilet completed with Max A and Rt side supported. He voided B+B while seated. +2 for hygiene without Stedy. Pt able to elevate each LE for OT to thread into clean pants (due to soiled brief and pants). Max A for balance when pt lifted pants over hips in standing. Oral care completed w/c level with OT facilitationg R UE as gross stabilizer after. For remainder of session, focused on standing balance, Rt weightbearing, cognition, and social participation. He first played Wii bowling against family members. Pt participated while in semi-perched and standing positions in [place] with Min-Mod A for balance. Scanning to Rt to give controller from opponent. Manual facilitation and vcs provided for midline orientation. Similar cuing provided while he stood at elevated table to compete with visitors in familiar game of [place]. Mod A for supporting Rt side and min vcs for recall of discussed rules of game. We used adaptive card holder while weightbearing with R UE on table. Opponents on Lt and Rt sides of table with social interaction between everyone. Longest standing time without rest 10 minutes! No s/s orthostatics. At end of session pt was taken back to room in w/c and left  with family, half lap tray, safety belt fastened, and all needs.   Therapy Documentation Precautions:  Precautions Precautions: Fall Precaution Comments: bone flap in L abdomen, no L side lying Restrictions Weight Bearing Restrictions: No Pain: No s/s pain during tx  Pain Assessment Pain Score: 0-No pain ADL:       See Function Navigator for Current Functional Status.   Therapy/Group: Individual Therapy  Reise Hietala A Dorrell Mitcheltree 03/13/2018, 3:45 PM

## 2018-03-14 ENCOUNTER — Inpatient Hospital Stay (HOSPITAL_COMMUNITY): Payer: BLUE CROSS/BLUE SHIELD

## 2018-03-14 ENCOUNTER — Inpatient Hospital Stay (HOSPITAL_COMMUNITY): Payer: BLUE CROSS/BLUE SHIELD | Admitting: Physical Therapy

## 2018-03-14 ENCOUNTER — Inpatient Hospital Stay (HOSPITAL_COMMUNITY): Payer: BLUE CROSS/BLUE SHIELD | Admitting: Occupational Therapy

## 2018-03-14 MED ORDER — PNEUMOCOCCAL VAC POLYVALENT 25 MCG/0.5ML IJ INJ
0.5000 mL | INJECTION | INTRAMUSCULAR | Status: AC
  Administered 2018-03-16: 0.5 mL via INTRAMUSCULAR
  Filled 2018-03-14: qty 0.5

## 2018-03-14 NOTE — Progress Notes (Signed)
Physical Therapy Session Note  Patient Details  Name: Xavier Villegas MRN: [id number] Date of Birth: 04/26/1981  Today's Date: 03/14/2018 PT Individual Time: 0354-6568 PT Individual Time Calculation (min): 60 min   Short Term Goals: Week 2:  PT Short Term Goal 1 (Week 2): Pt will initiate stair training with PT PT Short Term Goal 2 (Week 2): Pt will initiate stair training with PT PT Short Term Goal 3 (Week 2): Pt will amb 40 feet with +1 assist and LRAD  PT Short Term Goal 4 (Week 2): Pt will propel w/c using hemi technique navigating tight door passages and obstacles with supervision for 100 feet with <10% error PT Short Term Goal 5 (Week 2): Pt will perform stand pivot transfers and squat pivot transfers consistently with min A  Skilled Therapeutic Interventions/Progress Updates:    Pt gestures slight head discomfort prior to session start and sx monitored throughout duration. Pt propels w/c from room to gym for 75 ft using L hemi technique and supervision for R visual scanning to avoid obstacles.Pt ambulates 30 ft using L HR with therapist providing max A for proper weight shifting for R LE advancement and tactile feedback to activate quad during stance phase to allow L LE advancement. Pt then performed x1/ 30 feet retrograde ambulation with L HR and therapist providing R LE facilitation during swing phase and placement as well as weight shifting to allow L LE advancement for backwards walking. Pt then ambulates 30 feet with L HR with inc in gait speed and ability to initiated R LE stepping with occasional facilitation of R stepping during fatigue onset. Only standing rest breaks during gait trials. PT assisted Pt into tall kneeling with +2 helpers to perform 2x10 reps of hip extension for strengthening and NMR. Pt requires +2 helpers to perform side sitting> sitting edge of mat. Pt performs stand pivot back to chair with mod A to rest. Pt attempted to perform tall kneeling with forced R  UE weight bearing for NMR during cog task; however, unable to WB through R UE due to inc tone in R hand. Pt BP= 133/96 s/p 2nd attempt. Pt returned to sitting edge of mat with +2 helpers and performed peg board table top activity with forced R UE WB on wedge for NMR. Pt requires supervision to perform peg board activity fading to max cueing 2/2 mental fatigue. With adequate rest breaks Pt able to perform peg board activity with supervision. Pt then performs squat pivot towards his L LE with min A from mat>chair and therapist propels w/c back to room for time management. Pt returned to sitting with all needs met and girlfriend present.  Therapy Documentation Precautions:  Precautions Precautions: Fall Precaution Comments: bone flap in L abdomen, no L side lying Restrictions Weight Bearing Restrictions: No   See Function Navigator for Current Functional Status.   Therapy/Group: Individual Therapy  Floreen Comber 03/14/2018, 12:18 PM

## 2018-03-14 NOTE — Progress Notes (Signed)
Speech Language Pathology Daily Session Note  Patient Details  Name: Xavier Villegas MRN: [id number] Date of Birth: [birth year]  Today's Date: 03/14/2018 SLP Individual Time: 6948-5462 SLP Individual Time Calculation (min): 88 min  Short Term Goals: Week 2: SLP Short Term Goal 1 (Week 2): Pt will consume trials of regular textured foods with thin liquids with supervision A cues for use of swallowing precautions and minimal overt s/s of aspiration.  SLP Short Term Goal 1 - Progress (Week 2): Updated due to goal met SLP Short Term Goal 2 (Week 2): Pt will produce vocalizations in 5 out of 10 opportunities with Mod A cues.  SLP Short Term Goal 2 - Progress (Week 2): Met SLP Short Term Goal 3 (Week 2): Pt will demonstrate selective attention in mildly distracting environment for 30 minutes with Min A cues.  SLP Short Term Goal 3 - Progress (Week 2): Updated due to goal met SLP Short Term Goal 4 (Week 2): Pt will complete mildly complex problem solving tasks with Min A cues.  SLP Short Term Goal 4 - Progress (Week 2): Updated due to goal met SLP Short Term Goal 5 (Week 2): Pt will utilize multimodal communication to express wants/needs with Mod A cues in 8 out of 10 opportunities.  SLP Short Term Goal 5 - Progress (Week 2): Updated due to goal met SLP Short Term Goal 6 (Week 2): Pt will locate items to the right of midline during basic, familiar tasks with supervsison assist multimodal cues.    Skilled Therapeutic Interventions:Skilled ST services focused on cognitive and swallow skills. SLP facilitated expressive speech utilizing MIT for common phrases with written aid, pt required initial max A verbal cues fading to mod-min A verbal cues. SLP facilitated use of alphabet communication board to aid in communication, given written cue of familiar words ( name, pet's names, name of hospital) pt demonstrated ability to spell requested word initially requring hand over hand cuing fading to mod A  verbal cues. SLP questions possibility visual deficit verse comprehension of language. Pt demonstrated selective attention in moderately distracting environment, in speech room with door open, with min A verbal cues in 30 minute intervals. SLP facilitated PO consumption of regular textures and thin liquids with lunch tray no overt s/s aspiration and supervision a verbal cues for swallow precaution, small bites. Pt required assistance with tray set up and min-supervision A verbal cues to utilize nonverbal communication request assistance accessing items. SLP provided education of swallow precautions, proper postioning during PO intake, assisting accessing items and cues for small bites/slow rate, pt's girlfriend return demonstration of cuing and was signed off to provide supervision A during meals. Pt demonstrated nonverbal communication of pain, grabbing arm and facial grimace, utilizing number scale on alphabet chart pt indicting level 5 30 minutes into session, however at the end of session when SLP notified nurse of persistent pain, pt indicating level 3 and requested no pain medication given yes/no questions for clarification. Pt was left in room with call bell within reach and bed alarm set. Recommend to continue skilled ST services.        Function:  Eating Eating   Modified Consistency Diet: No Eating Assist Level: Supervision or verbal cues           Cognition Comprehension Comprehension assist level: Understands basic 90% of the time/cues < 10% of the time  Expression   Expression assist level: Expresses basic 25 - 49% of the time/requires cueing 50 - 75% of the time.  Uses single words/gestures.  Social Interaction Social Interaction assist level: Interacts appropriately 50 - 74% of the time - May be physically or verbally inappropriate.  Problem Solving Problem solving assist level: Solves basic 50 - 74% of the time/requires cueing 25 - 49% of the time  Memory Memory assist level:  Recognizes or recalls 50 - 74% of the time/requires cueing 25 - 49% of the time    Pain Pain Assessment Pain Scale: 0-10 Pain Score: 3  Pain Type: Acute pain Pain Location: Arm Pain Intervention(s): RN made aware;Repositioned  Therapy/Group: Individual Therapy  Ariyel Jeangilles 03/14/2018, 1:02 PM

## 2018-03-14 NOTE — Progress Notes (Signed)
Subjective/Complaints: Up in [place]. No new issues. States he's tolerating the baclofen "ok". It is helping spasms. Slept well  ROS: Patient denies fever, rash, sore throat, blurred vision, nausea, vomiting, diarrhea, cough, shortness of breath or chest pain, joint or back pain, headache, or mood change.    Objective: Vital Signs: Blood pressure 138/86, pulse 61, temperature 98.5 F (36.9 C), temperature source Oral, resp. rate 19, height 6' (1.829 m), weight 83 kg (182 lb 15.7 oz), SpO2 97 %. No results found. No results found for this or any previous visit (from the past 72 hour(s)).   Constitutional: No distress . Vital signs reviewed. HEENT: EOMI, oral membranes moist, scalp with staples Neck: supple Cardiovascular: RRR without murmur. No JVD    Respiratory: CTA Bilaterally without wheezes or rales. Normal effort    GI: BS +, non-tender, non-distended  Neuro:  Expressive language deficits. Defaults to an affirmative head nod often Motor: lUE/LLE: 5/5 proximal distal RUE/RLE; 0/5 proximal distal (unchanged) MAS: Left elbow and finger flexors 1-2/4, right ankle and plantar flexors tr to 1/4, toes up, DTR's 3+ Musc/Skel:  No edema or tenderness in extremities Psych: pleasant and cooperative Skin: scalp staples intact   Assessment/Plan: 1. Functional deficits secondary to left MCA infarct with hemorrhagic transformation status post craniectomy which require 3+ hours per day of interdisciplinary therapy in a comprehensive inpatient rehab setting. Physiatrist is providing close team supervision and 24 hour management of active medical problems listed below. Physiatrist and rehab team continue to assess barriers to discharge/monitor patient progress toward functional and medical goals. FIM: Function - Bathing Bathing activity did not occur: Refused Position: Shower Body parts bathed by patient: Right arm, Chest, Abdomen, Front perineal area, Right upper leg, Left upper leg, Left lower  leg Body parts bathed by helper: Left arm, Back, Buttocks Assist Level: Touching or steadying assistance(Pt > 75%)  Function- Upper Body Dressing/Undressing What is the patient wearing?: Pull over shirt/dress Pull over shirt/dress - Perfomed by patient: Thread/unthread left sleeve, Put head through opening, Pull shirt over trunk, Thread/unthread right sleeve Pull over shirt/dress - Perfomed by helper: Thread/unthread right sleeve Assist Level: Touching or steadying assistance(Pt > 75%)(for balance) Function - Lower Body Dressing/Undressing What is the patient wearing?: Pants Position: Other (comment)(sitting on toilet) Pants- Performed by patient: Thread/unthread left pants leg, Pull pants up/down Pants- Performed by helper: Thread/unthread left pants leg, Thread/unthread right pants leg, Pull pants up/down Non-skid slipper socks- Performed by patient: Don/doff right sock, Don/doff left sock Non-skid slipper socks- Performed by helper: Don/doff right sock, Don/doff left sock Socks - Performed by patient: Don/doff right sock, Don/doff left sock(supine in bed) Socks - Performed by helper: Don/doff right sock, Don/doff left sock Shoes - Performed by patient: Don/doff right shoe, Don/doff left shoe(supine in bed) Shoes - Performed by helper: Don/doff right shoe, Don/doff left shoe, Fasten left, Fasten right Assist for footwear: Maximal assist Assist for lower body dressing: 2 Helpers(sit<stand from elevated toilet)  Function - Toileting Toileting activity did not occur: No continent bowel/bladder event Toileting steps completed by helper: Adjust clothing prior to toileting, Performs perineal hygiene, Adjust clothing after toileting Toileting Assistive Devices: Grab bar or rail Assist level: Two helpers  Function - Air cabin crew transfer activity did not occur: Safety/medical concerns Toilet transfer assistive device: Elevated toilet seat/BSC over toilet Mechanical lift:  Stedy Assist level to toilet: Maximal assist (Pt 25 - 49%/lift and lower) Assist level from toilet: Maximal assist (Pt 25 - 49%/lift and lower)  Function - Chair/bed  transfer Chair/bed transfer activity did not occur: Safety/medical concerns Chair/bed transfer method: Squat pivot, Stand pivot Chair/bed transfer assist level: Touching or steadying assistance (Pt > 75%) Chair/bed transfer assistive device: Armrests Chair/bed transfer details: Verbal cues for precautions/safety, Verbal cues for safe use of DME/AE, Manual facilitation for placement  Function - Locomotion: Wheelchair Will patient use wheelchair at discharge?: (tbd) Type: Manual Max wheelchair distance: 150 Assist Level: Supervision or verbal cues Assist Level: Supervision or verbal cues Assist Level: Supervision or verbal cues Turns around,maneuvers to table,bed, and toilet,negotiates 3% grade,maneuvers on rugs and over doorsills: No Function - Locomotion: Ambulation Ambulation activity did not occur: Safety/medical concerns Assistive device: Rail in hallway Max distance: 30 Assist level: Moderate assist (Pt 50 - 74%) Assist level: Moderate assist (Pt 50 - 74%) Walk 50 feet with 2 turns activity did not occur: Safety/medical concerns Assist level: Maximal assist (Pt 25 - 49%) Walk 150 feet activity did not occur: Safety/medical concerns Walk 10 feet on uneven surfaces activity did not occur: Safety/medical concerns  Function - Comprehension Comprehension: Auditory Comprehension assist level: Understands basic 90% of the time/cues < 10% of the time  Function - Expression Expression: Verbal Expression assist level: Expresses basic 25 - 49% of the time/requires cueing 50 - 75% of the time. Uses single words/gestures.  Function - Social Interaction Social Interaction assist level: Interacts appropriately 50 - 74% of the time - May be physically or verbally inappropriate.  Function - Problem Solving Problem solving  assist level: Solves basic 50 - 74% of the time/requires cueing 25 - 49% of the time  Function - Memory Memory assist level: Recognizes or recalls 50 - 74% of the time/requires cueing 25 - 49% of the time Patient normally able to recall (first 3 days only): Current season, Location of own room, Staff names and faces, That he or she is in a hospital   Medical Problem List and Plan: 1.  Right hemiparesis, dysphagia, and aphasia secondary to large left MCA infarct with hemorrhagic transformation s/p hemicraniectomy.  Continue CIR  Helmet for safety OOB    2.  DVT Prophylaxis/Anticoagulation: Mechanical: Sequential compression devices, below knee Bilateral lower extremities 3. Pain Management: Tylenol as needed  -baclofen for spasms--increased to 10mg  TID--tolerating thus far  -splint/ROM 4. Mood: Consult LCSW  for evaluation and support 5. Neuropsych: This patient is not fully capable of making decisions on his own behalf. 6. Skin/Wound Care: Routine pressure relief measures.  Monitor wound for healing.  Maintain adequate nutritional status. 7. Fluids/Electrolytes/Nutrition: Monitor I's/O's.   Encourage nectar thick fluid intake.     8. Dysphagia: Continue dysphagia 3, nectar liquids--    -encourage fluids 9. Leucocytosis: ?Progressive and felt to be reactive--ID recommends d/c antibiotic and monitor for signs of infection.   Mother with history of CLL.  WBCs 10.5 8/2--recheck this week 10. High homocysteine levels/low folate levels/boderline low B12: On high dose folic supplement, B6 and received B12 inj 7/14  . MRHFR--heterozygous /single mutation ID.  May cause abnormal clotting 11. GERD: Continue PPI.  12. Hyponatremia  Sodium 139 on 8/2 13. Neurogenic bladder:  -incontinent but retention improving  -continue bladder training, up to toilet  -no meds at this time, recent ucx neg   LOS (Days) 13 A FACE TO [place] 03/14/2018, 8:45 AM

## 2018-03-14 NOTE — Progress Notes (Signed)
Occupational Therapy Session Note  Patient Details  Name: Xavier Villegas MRN: [id number] Date of Birth: 06/01/1981  Today's Date: 03/14/2018 OT Individual Time: 2025-4270 OT Individual Time Calculation (min): 59 min   Short Term Goals: Week 2:  OT Short Term Goal 1 (Week 2): Pt will complete toilet transfers with Mod A OT Short Term Goal 2 (Week 2): Pt will exhibit improved trunk control by completing 1/3 components of donning pants while sitting unsupported  OT Short Term Goal 3 (Week 2): Pt will complete 4/4 components of donning shirt with close supervision for balance   Skilled Therapeutic Interventions/Progress Updates:    Pt greeted supine in bed with RN present administering medication. Girlfriend Shirlean Mylar also present. He was amenable to shower. Tx focus on Rt NMR, midline orientation, and balance during bathing and dressing tasks at shower level. Helmet donned with setup prior to all transfers. Stedy used for transfer to 3:1 in shower, with Min A for sit<stands. He bathed while seated with Min-Mod A for balance when he placed each LE into figure 4 position to wash feet. He had incontinent BM in shower. Perihygiene completed in [place] afterwards near sink. He required steady assist for balance and vcs to keep Rt hand on bar. Dressing completed sit<stand at sink with increased time and Min-Mod A for dynamic balance. Vcs also required for proper orientation of shirt and pants. Therapist donned Rt aircast. Shirlean Mylar was present throughout session to observe, and we discussed ADL based methods for NMR facilitation. He completed oral care/grooming tasks w/c level with HOH for using R UE as gross stabilizer. At end of session pt was left in w/c with safety belt fastened, call bell within reach, and girlfriend present.   Therapy Documentation Precautions:  Precautions Precautions: Fall Precaution Comments: bone flap in L abdomen, no L side lying Restrictions Weight Bearing Restrictions:  No Pain: No s/s pain during session  Pain Assessment Pain Scale: 0-10 Pain Score: 0-No pain ADL:      See Function Navigator for Current Functional Status.   Therapy/Group: Individual Therapy  Marilea Gwynne A Laray Corbit 03/14/2018, 12:32 PM

## 2018-03-15 ENCOUNTER — Inpatient Hospital Stay (HOSPITAL_COMMUNITY): Payer: BLUE CROSS/BLUE SHIELD

## 2018-03-15 ENCOUNTER — Inpatient Hospital Stay (HOSPITAL_COMMUNITY): Payer: BLUE CROSS/BLUE SHIELD | Admitting: Occupational Therapy

## 2018-03-15 ENCOUNTER — Inpatient Hospital Stay (HOSPITAL_COMMUNITY): Payer: BLUE CROSS/BLUE SHIELD | Admitting: Physical Therapy

## 2018-03-15 NOTE — Progress Notes (Signed)
Occupational Therapy Session Note  Patient Details  Name: Lorin Gawron MRN: [id number] Date of Birth: 06-26-1981  Today's Date: 03/15/2018 OT Individual Time: 1219-7588 OT Individual Time Calculation (min): 57 min    Short Term Goals: Week 2:  OT Short Term Goal 1 (Week 2): Pt will complete toilet transfers with Mod A OT Short Term Goal 2 (Week 2): Pt will exhibit improved trunk control by completing 1/3 components of donning pants while sitting unsupported  OT Short Term Goal 3 (Week 2): Pt will complete 4/4 components of donning shirt with close supervision for balance   Skilled Therapeutic Interventions/Progress Updates:    Upon entering the room, pt seated in wheelchair with aunt present in the room. Pt with no c/o, signs, or symptoms of pain this session. Caregiver requesting pt go outside with therapy as he hasn't been outside since admission to hospital. OT assisted pt via wheelchair outside and while seated at table. Pt utilized communication board to answer "yes" and "no" questions accurately. Pt unable to spell things out with use of board. When asked to spell his name he selected the correct letters but unable to organize them in the correct order. Pt playing card game of old maid to work on visually scanning L > R to locate matches. Pt needing increased time and min verbal guidance cues for visual scanning. Pt propelled self in wheelchair with hemiplegic technique 400' with supervision and min verbal cues to objects to the R. Pt navigated self to room with increased time. Pt seated in wheelchair with alarm belt donned for safety. Call bell and all needs within reach.   Therapy Documentation Precautions:  Precautions Precautions: Fall Precaution Comments: bone flap in L abdomen, no L side lying Restrictions Weight Bearing Restrictions: No  See Function Navigator for Current Functional Status.   Therapy/Group: Individual Therapy  [name] 03/15/2018, 2:27  PM

## 2018-03-15 NOTE — Progress Notes (Signed)
Subjective/Complaints: Sitting in bed. No problems overnight. Denies pain.   ROS: limited due to language/communication     Objective: Vital Signs: Blood pressure 103/69, pulse 60, temperature 97.7 F (36.5 C), temperature source Oral, resp. rate 18, height 6' (1.829 m), weight 83 kg (182 lb 15.7 oz), SpO2 99 %. No results found. No results found for this or any previous visit (from the past 72 hour(s)).   Constitutional: No distress . Vital signs reviewed. HEENT: EOMI, oral membranes moist, sutures out of scalp Neck: supple Cardiovascular: RRR without murmur. No JVD    Respiratory: CTA Bilaterally without wheezes or rales. Normal effort    GI: BS +, non-tender, non-distended   Neuro:  Expressive language deficits. Defaults to an affirmative head nod often Motor: lUE/LLE: 5/5 proximal distal RUE/RLE; 0/5 proximal distal (unchanged) MAS: Left elbow and finger flexors 1/4, right ankle and plantar flexors tr to 1/4, toes up, DTR's 3+ Musc/Skel:  No edema or tenderness in extremities Psych: pleasant and cooperative Skin: intact flap site   Assessment/Plan: 1. Functional deficits secondary to left MCA infarct with hemorrhagic transformation status post craniectomy which require 3+ hours per day of interdisciplinary therapy in a comprehensive inpatient rehab setting. Physiatrist is providing close team supervision and 24 hour management of active medical problems listed below. Physiatrist and rehab team continue to assess barriers to discharge/monitor patient progress toward functional and medical goals. FIM: Function - Bathing Bathing activity did not occur: Refused Position: Shower Body parts bathed by patient: Right arm, Chest, Abdomen, Front perineal area, Right upper leg, Left upper leg, Left lower leg Body parts bathed by helper: Left arm, Back, Buttocks Assist Level: Touching or steadying assistance(Pt > 75%)  Function- Upper Body Dressing/Undressing What is the patient  wearing?: Pull over shirt/dress Pull over shirt/dress - Perfomed by patient: Thread/unthread left sleeve, Put head through opening, Pull shirt over trunk, Thread/unthread right sleeve Pull over shirt/dress - Perfomed by helper: Thread/unthread right sleeve Assist Level: Touching or steadying assistance(Pt > 75%)(for balance) Function - Lower Body Dressing/Undressing What is the patient wearing?: Pants, Socks, Shoes, AFO Position: Wheelchair/chair at sink Pants- Performed by patient: Thread/unthread left pants leg, Pull pants up/down, Thread/unthread right pants leg Pants- Performed by helper: Thread/unthread left pants leg, Thread/unthread right pants leg, Pull pants up/down Non-skid slipper socks- Performed by patient: Don/doff right sock, Don/doff left sock Non-skid slipper socks- Performed by helper: Don/doff right sock, Don/doff left sock Socks - Performed by patient: Don/doff right sock, Don/doff left sock Socks - Performed by helper: Don/doff right sock, Don/doff left sock Shoes - Performed by patient: Don/doff right shoe, Don/doff left shoe Shoes - Performed by helper: Don/doff right shoe, Don/doff left shoe, Fasten left, Fasten right AFO - Performed by helper: Don/doff right AFO Assist for footwear: Maximal assist Assist for lower body dressing: (Mod A for dynamic sitting/standing balance)  Function - Toileting Toileting activity did not occur: No continent bowel/bladder event Toileting steps completed by helper: Adjust clothing prior to toileting, Performs perineal hygiene, Adjust clothing after toileting Toileting Assistive Devices: Grab bar or rail Assist level: Two helpers  Function - Air cabin crew transfer activity did not occur: Safety/medical concerns Toilet transfer assistive device: Elevated toilet seat/BSC over toilet Mechanical lift: Stedy Assist level to toilet: Maximal assist (Pt 25 - 49%/lift and lower) Assist level from toilet: Maximal assist (Pt 25 -  49%/lift and lower)  Function - Chair/bed transfer Chair/bed transfer activity did not occur: Safety/medical concerns Chair/bed transfer method: Squat pivot, Stand pivot Chair/bed  transfer assist level: Moderate assist (Pt 50 - 74%/lift or lower) Chair/bed transfer assistive device: Armrests Chair/bed transfer details: Verbal cues for precautions/safety, Verbal cues for safe use of DME/AE, Manual facilitation for placement  Function - Locomotion: Wheelchair Will patient use wheelchair at discharge?: (tbd) Type: Manual Max wheelchair distance: 75 Assist Level: Supervision or verbal cues Assist Level: Supervision or verbal cues Assist Level: Supervision or verbal cues Turns around,maneuvers to table,bed, and toilet,negotiates 3% grade,maneuvers on rugs and over doorsills: No Function - Locomotion: Ambulation Ambulation activity did not occur: Safety/medical concerns Assistive device: Rail in hallway Max distance: 30 Assist level: Moderate assist (Pt 50 - 74%) Assist level: Moderate assist (Pt 50 - 74%) Walk 50 feet with 2 turns activity did not occur: Safety/medical concerns Assist level: Maximal assist (Pt 25 - 49%) Walk 150 feet activity did not occur: Safety/medical concerns Walk 10 feet on uneven surfaces activity did not occur: Safety/medical concerns  Function - Comprehension Comprehension: Auditory Comprehension assist level: Understands basic 90% of the time/cues < 10% of the time  Function - Expression Expression: Verbal Expression assistive device: Communication board Expression assist level: Expresses basic 25 - 49% of the time/requires cueing 50 - 75% of the time. Uses single words/gestures.  Function - Social Interaction Social Interaction assist level: Interacts appropriately 50 - 74% of the time - May be physically or verbally inappropriate.  Function - Problem Solving Problem solving assist level: Solves basic 50 - 74% of the time/requires cueing 25 - 49% of the  time  Function - Memory Memory assist level: Recognizes or recalls 50 - 74% of the time/requires cueing 25 - 49% of the time Patient normally able to recall (first 3 days only): Current season, Location of own room, Staff names and faces, That he or she is in a hospital   Medical Problem List and Plan: 1.  Right hemiparesis, dysphagia, and aphasia secondary to large left MCA infarct with hemorrhagic transformation s/p hemicraniectomy.  Continue CIR  Helmet for safety OOB    2.  DVT Prophylaxis/Anticoagulation: Mechanical: Sequential compression devices, below knee Bilateral lower extremities 3. Pain Management: Tylenol as needed  -baclofen for spasms--increased to 10mg  TID--tolerating thus far  -splint/ROM 4. Mood: Consult LCSW  for evaluation and support 5. Neuropsych: This patient is not fully capable of making decisions on his own behalf. 6. Skin/Wound Care: Routine pressure relief measures.  Monitor wound for healing.  Maintain adequate nutritional status. 7. Fluids/Electrolytes/Nutrition: Monitor I's/O's.   Encourage nectar thick fluid intake.     8. Dysphagia: Continue dysphagia 3, nectar liquids--    -encourage fluids 9. Leucocytosis: ?Progressive and felt to be reactive--ID recommends d/c antibiotic and monitor for signs of infection.   Mother with history of CLL.  WBCs 10.5 8/2--recheck Friday 10. High homocysteine levels/low folate levels/boderline low B12: On high dose folic supplement, B6 and received B12 inj 7/14  . MRHFR--heterozygous /single mutation ID.  May cause abnormal clotting 11. GERD: Continue PPI.  12. Hyponatremia  Sodium 139 on 8/2 13. Neurogenic bladder:  -incontinent but retention improved  -continue bladder training, up to toilet  -no meds at this time, recent ucx neg   LOS (Days) 14 A FACE TO [place] 03/15/2018, 8:40 AM

## 2018-03-15 NOTE — Progress Notes (Signed)
Physical Therapy Session Note  Patient Details  Name: Xavier Villegas MRN: [id number] Date of Birth: 13-Aug-1980  Today's Date: 03/15/2018 PT Individual Time: 1530-1630 PT Individual Time Calculation (min): 60 min   Short Term Goals: Week 2:  PT Short Term Goal 1 (Week 2): Pt will initiate stair training with PT PT Short Term Goal 2 (Week 2): Pt will initiate stair training with PT PT Short Term Goal 3 (Week 2): Pt will amb 40 feet with +1 assist and LRAD  PT Short Term Goal 4 (Week 2): Pt will propel w/c using hemi technique navigating tight door passages and obstacles with supervision for 100 feet with <10% error PT Short Term Goal 5 (Week 2): Pt will perform stand pivot transfers and squat pivot transfers consistently with min A  Skilled Therapeutic Interventions/Progress Updates:    Pt gestures to slight head discomfort prior to session start and therapist monitored Pt sx throughout session. Pt propels w/c to day room with supervision and VC's for R visual field scanning to avoid obstacles using L hemi technique. STS and stand pivot transfers min A consistently throughout session. NuStep for 10 min duration on level 8 for NMR and reciprocal extremity movement with green t band around distal thigh to maintain proper LE alignment. Pt required rest break at every 2 min interval due to fatigue. x3 trials of gait training with HW for 5', 50', 20' with mod A for weight shifting, R LE advancement and foot placement with rest break in between trials. Pt demonstrates improved muscle activation for R LE advancement. Pt performs x2 STS with 1" block under L LE to facilitate inc WB through R LE for NMR. Pt performs lateral weight shifting in standing position with L LE slightly flexed and tactile feed back to R LE for quad activation. Pt performs x1 STS with bilateral UE's to R LE to facilitate increased WB through R UE & LE for increased NMR in WB position. Pt requires min A throughout activity  duration for steadying. PT propels Pt back to room sitting in chair with tray table and call bell in reach and all needs met.   Therapy Documentation Precautions:  Precautions Precautions: Fall Precaution Comments: bone flap in L abdomen, no L side lying Restrictions Weight Bearing Restrictions: No   See Function Navigator for Current Functional Status.   Therapy/Group: Individual Therapy  Floreen Comber 03/15/2018, 4:45 PM

## 2018-03-15 NOTE — Plan of Care (Signed)
  Problem: Consults Goal: RH STROKE PATIENT EDUCATION Description See Patient Education module for education specifics  Outcome: Progressing   Problem: RH BOWEL ELIMINATION Goal: RH STG MANAGE BOWEL WITH ASSISTANCE Description STG Manage Bowel with mod I Assistance.   Outcome: Progressing Flowsheets (Taken 03/15/2018 1321) STG: Pt will manage bowels with assistance: 6-Modified independent Goal: RH STG MANAGE BOWEL W/MEDICATION W/ASSISTANCE Description STG Manage Bowel with Medication with mod I Assistance.   Outcome: Progressing Flowsheets (Taken 03/15/2018 1321) STG: Pt will manage bowels with medication with assistance: 3-Moderate assistance   Problem: RH BLADDER ELIMINATION Goal: RH STG MANAGE BLADDER WITH ASSISTANCE Description STG Manage Bladder With mod I Assistance   Outcome: Progressing Flowsheets (Taken 03/15/2018 1321) STG: Pt will manage bladder with assistance: 1-Total assistance   Problem: RH SKIN INTEGRITY Goal: RH STG ABLE TO PERFORM INCISION/WOUND CARE W/ASSISTANCE Description STG Able To Perform Incision/Wound Care With mod I  Assistance.  Outcome: Progressing Flowsheets (Taken 03/15/2018 1321) STG: Pt will be able to perform incision/wound care with assistance: 4-Minimal assistance   Problem: RH SAFETY Goal: RH STG ADHERE TO SAFETY PRECAUTIONS W/ASSISTANCE/DEVICE Description STG Adhere to Safety Precautions With mod I Assistance/Device. Telesitter use as needed  Outcome: Progressing Flowsheets (Taken 03/15/2018 1321) STG:Pt will adhere to safety precautions with assistance/device: 3-Moderate assistance   Problem: RH COGNITION-NURSING Goal: RH STG USES MEMORY AIDS/STRATEGIES W/ASSIST TO PROBLEM SOLVE Description STG Uses Memory Aids/Strategies With cues/supervision Assistance to Problem Solve.  Outcome: Progressing Flowsheets (Taken 03/15/2018 1321) STG: Uses memory aids/strategies with assistance: 4-Minimal assistance   Problem: RH KNOWLEDGE DEFICIT Goal: RH  STG INCREASE KNOWLEDGE OF DYSPHAGIA/FLUID INTAKE Description With cues/resources/supervision  Outcome: Progressing

## 2018-03-15 NOTE — Progress Notes (Signed)
Speech Language Pathology Weekly Progress and Session Note  Patient Details  Name: Xavier Villegas MRN: [id number] Date of Birth: 1980-11-29  Beginning of progress report period: March 08, 2018 End of progress report period: March 15, 2018  Today's Date: 03/15/2018 SLP Individual Time: 1130-1200 / 900-100  SLP Individual Time Calculation (min): 30 min and 60 min  Short Term Goals: Week 2: SLP Short Term Goal 1 (Week 2): Pt will consume trials of regular textured foods with thin liquids with supervision A cues for use of swallowing precautions and minimal overt s/s of aspiration.  SLP Short Term Goal 1 - Progress (Week 2): Met SLP Short Term Goal 2 (Week 2): Pt will produce vocalizations in 5 out of 10 opportunities with Mod A cues.  SLP Short Term Goal 2 - Progress (Week 2): Met SLP Short Term Goal 3 (Week 2): Pt will demonstrate selective attention in mildly distracting environment for 30 minutes with Min A cues.  SLP Short Term Goal 3 - Progress (Week 2): Met SLP Short Term Goal 4 (Week 2): Pt will complete mildly complex problem solving tasks with Min A cues.  SLP Short Term Goal 4 - Progress (Week 2): Met SLP Short Term Goal 5 (Week 2): Pt will utilize multimodal communication to express wants/needs with Mod A cues in 8 out of 10 opportunities.  SLP Short Term Goal 5 - Progress (Week 2): Not met SLP Short Term Goal 6 (Week 2): Pt will locate items to the right of midline during basic, familiar tasks with supervsison assist multimodal cues.   SLP Short Term Goal 6 - Progress (Week 2): Met    New Short Term Goals: Week 3: SLP Short Term Goal 1 (Week 3): Pt will demonstrate selective attention in mildly distracting environment for 45 minutes with Min A cues.  SLP Short Term Goal 2 (Week 3): Pt will complete mildly complex problem solving tasks with supervision A cues.  SLP Short Term Goal 3 (Week 3): Pt will self-monitor and self correct functional errors with supervision A verbal  cues in problem solving tasks. SLP Short Term Goal 4 (Week 3): Pt will utilize multimodal communication to express wants/needs with Mod A cues in 8 out of 10 opportunities.  SLP Short Term Goal 5 (Week 3): Pt will locate items to the right of midline during basic, familiar tasks at Mod I.   Weekly Progress Updates: Pt demonstrated great progress meeting 5 out 6 goals, currently Min A for cognitive skills, Max-Min A multimodal communication, Supervision A for right inattention and Mod I for swallow function. Pt was upgraded to regular textured foods and thin liquids via cup with intermittent supervision and set up assistance during this reporting period. Pt demonstrates expressive speech during structured melodic intonation therapy of common phrases/objects, occasional spontaneous speech at word/phrase level, and spelling utilizing communication board with written model. Pt demonstrates functional problem solving skills in mildly complex problem solving task, however deficits in language continue to impact ability and range applicable tasks. Pt would continue to benefit from skilled ST services in order to maximize functional independence and reduce burden of care, requiring 24/hour supervision and continued ST services at next level of care.     Intensity: Minumum of 1-2 x/day, 30 to 90 minutes Frequency:   Duration/Length of Stay: 8/28 Treatment/Interventions: Cognitive remediation/compensation;Cueing hierarchy;Dysphagia/aspiration precaution training;Functional tasks;Patient/family education;Environmental controls;Internal/external aids;Multimodal communication approach;Speech/Language facilitation   Daily Session  Skilled Therapeutic Interventions:  1# Skilled ST services focused on cognitive skills. SLP assisted nurse  with transfer of pt from bed, dressed and into WC, pt required min A verbal cues. SLP facilitated mildly complex problem solving skills utilizing basic money management and 4-5  step picture sequencing tasks, pt required supervision A verbal cues to select correct number of change displayed in a field of three and min A verbal cues fade to supervision A verbal cues for 4 step and min A verbal cues for 5 step sequences. SLP facilitated expressive speech utilizing MIT in common phrases/naming objects with 70% accuracy fading to 90% accuracy with max a verbal cues. SLP facilitated use of alphabet communication board, pt required supervision A verbal cues and extra time to spell simple word of objects with written aid. Pt was left in room with call bell within reach and chair alaram set. ST reccomends to continue skilled ST services.   2# Skilled ST services focused on swallow skills and family education. SLP facilitated PO consumption of regular textured foods and thin liquids via cup, with initial set up. Pt demonstrated two minor coughs during PO intake, however demonstrated overall safe use of swallow strategies. Pt's aunt entered room, SLP provided education of cuing requiring during Po consumption and signed aunt for for supervision. SLP downgraded need for supervision during meals to intermittent supervision A due to increase in swallow function with cognitive abilities. Pt was left in room with call bell within reach and bed alaram set.ST reccomends to continue skilled ST services.    Function:   Eating Eating   Modified Consistency Diet: No Eating Assist Level: Supervision or verbal cues           Cognition Comprehension Comprehension assist level: Understands basic 90% of the time/cues < 10% of the time  Expression Expression assistive device: Communication board Expression assist level: Expresses basic 25 - 49% of the time/requires cueing 50 - 75% of the time. Uses single words/gestures.  Social Interaction Social Interaction assist level: Interacts appropriately 50 - 74% of the time - May be physically or verbally inappropriate.  Problem Solving Problem solving  assist level: Solves basic 75 - 89% of the time/requires cueing 10 - 24% of the time  Memory Memory assist level: Recognizes or recalls 50 - 74% of the time/requires cueing 25 - 49% of the time   General    Pain Pain Assessment Pain Score: 0-No pain  Therapy/Group: Individual Therapy  Djuna Frechette  [facility] 03/15/2018, 3:34 PM

## 2018-03-16 ENCOUNTER — Inpatient Hospital Stay (HOSPITAL_COMMUNITY): Payer: BLUE CROSS/BLUE SHIELD | Admitting: Speech Pathology

## 2018-03-16 ENCOUNTER — Inpatient Hospital Stay (HOSPITAL_COMMUNITY): Payer: BLUE CROSS/BLUE SHIELD | Admitting: Physical Therapy

## 2018-03-16 NOTE — Progress Notes (Addendum)
Speech Language Pathology Daily Session Note  Patient Details  Name: Xavier Villegas MRN: [id number] Date of Birth: [birth year]  Today's Date: 03/17/2018 SLP Individual Time: 3299-2426 SLP Time Calculation: 60 minutes    Short Term Goals: Week 3: SLP Short Term Goal 1 (Week 3): Pt will demonstrate selective attention in mildly distracting environment for 45 minutes with supervision cues.  SLP Short Term Goal 2 (Week 3): Pt will complete mildly complex problem solving tasks with Min A cues.  SLP Short Term Goal 3 (Week 3): Pt will self-monitor and self correct functional errors with supervision A verbal cues in problem solving tasks. SLP Short Term Goal 4 (Week 3): Pt will utilize multimodal communication to express wants/needs with Min A cues in 9 out of 10 opportunities.  SLP Short Term Goal 5 (Week 3): Pt will locate items to the right of midline during basic, familar tasks with Min A cues.  SLP Short Term Goal 6 (Week 3): Pt will produce vocalizations in 5 out of 10 opportunities with Mod A cues.   Skilled Therapeutic Interventions: Skilled treatment session focused on communication goals as well as education with pt's aunt and dad at end of session.  SLP facilitated session by transferring pt from bed to wheelchair. Pt with effective use of gestures to indicate where clothing etc was located and to communicate desire to put on shoes. Pt also used facial gestures to communicate like/dislike of clothing choices. SLP further facilitated session by providing MIT to produce common phrases. Pt with increased speech-language intelligibility/production with SLP model. However when SLP verbal model faded, pt's language and intelligibility faded to unrecognizable utterances. SLP also provided Max A (with printed word) to locate letters on alphabet board. Attempts to fade cues by removing printed led to pt misspelling his name. Additionally, pt unable to spell verbal words like "good."  OT and PT  report pt inability and frustration with attempts to utilize letter board. Education provided that pt continues to require Max A cues that only SLP can provide at this time. Letter board was not returned to pt's room. SLP further facilitated session by providing Max A visual and verbal cues to produce /m/. Pt able to produce in 1 of 5 opportunities with Max A cues.   Education also provided to pt's family on recent progress with dysphagia. PT only needs cognitive cues for attention to right and with set-up during consumption.   After careful re-evaluation, goals have been readjusted to demonstrate patients current cognitive-linguistic function.        Function:   Cognition Comprehension Comprehension assist level: Understands basic 90% of the time/cues < 10% of the time  Expression   Expression assist level: Expresses basic 25 - 49% of the time/requires cueing 50 - 75% of the time. Uses single words/gestures.  Social Interaction Social Interaction assist level: Interacts appropriately 75 - 89% of the time - Needs redirection for appropriate language or to initiate interaction.;Interacts appropriately 90% of the time - Needs monitoring or encouragement for participation or interaction.  Problem Solving Problem solving assist level: Solves basic 50 - 74% of the time/requires cueing 25 - 49% of the time  Memory Memory assist level: Recognizes or recalls 50 - 74% of the time/requires cueing 25 - 49% of the time    Pain    Therapy/Group: Individual Therapy  Cormac Wint 03/17/2018, 1:23 PM

## 2018-03-16 NOTE — Progress Notes (Signed)
Subjective/Complaints: Pt sleeping when I entered. No new complaints  ROS: Patient denies fever, rash, sore throat, blurred vision, nausea, vomiting, diarrhea, cough, shortness of breath or chest pain, joint or back pain, headache, or mood change.   Objective: Vital Signs: Blood pressure 114/65, pulse 60, temperature 98.7 F (37.1 C), temperature source Oral, resp. rate 18, height 6' (1.829 m), weight 83.2 kg (183 lb 6.8 oz), SpO2 97 %. No results found. No results found for this or any previous visit (from the past 72 hour(s)).   Constitutional: No distress . Vital signs reviewed. HEENT: EOMI, oral membranes moist Neck: supple Cardiovascular: RRR without murmur. No JVD    Respiratory: CTA Bilaterally without wheezes or rales. Normal effort    GI: BS +, non-tender, non-distended  Neuro:  Expressive language deficits. Defaults to an affirmative head nod often Motor: lUE/LLE: 5/5 proximal distal RUE/RLE; 0/5 proximal distal (unchanged) MAS: Left elbow and finger flexors 1/4, right ankle and plantar flexors tr to 1/4, toes up, DTR's 3+ Musc/Skel:  No edema or tenderness in extremities Psych: pleasant and cooperative Skin: incisions intact   Assessment/Plan: 1. Functional deficits secondary to left MCA infarct with hemorrhagic transformation status post craniectomy which require 3+ hours per day of interdisciplinary therapy in a comprehensive inpatient rehab setting. Physiatrist is providing close team supervision and 24 hour management of active medical problems listed below. Physiatrist and rehab team continue to assess barriers to discharge/monitor patient progress toward functional and medical goals. FIM: Function - Bathing Bathing activity did not occur: Refused Position: Shower Body parts bathed by patient: Right arm, Chest, Abdomen, Front perineal area, Right upper leg, Left upper leg, Left lower leg Body parts bathed by helper: Left arm, Back, Buttocks Assist Level:  Touching or steadying assistance(Pt > 75%)  Function- Upper Body Dressing/Undressing What is the patient wearing?: Pull over shirt/dress Pull over shirt/dress - Perfomed by patient: Thread/unthread left sleeve, Put head through opening, Pull shirt over trunk, Thread/unthread right sleeve Pull over shirt/dress - Perfomed by helper: Thread/unthread right sleeve Assist Level: Touching or steadying assistance(Pt > 75%)(for balance) Function - Lower Body Dressing/Undressing What is the patient wearing?: Pants, Socks, Shoes, AFO Position: Wheelchair/chair at sink Pants- Performed by patient: Thread/unthread left pants leg, Pull pants up/down, Thread/unthread right pants leg Pants- Performed by helper: Thread/unthread left pants leg, Thread/unthread right pants leg, Pull pants up/down Non-skid slipper socks- Performed by patient: Don/doff right sock, Don/doff left sock Non-skid slipper socks- Performed by helper: Don/doff right sock, Don/doff left sock Socks - Performed by patient: Don/doff right sock, Don/doff left sock Socks - Performed by helper: Don/doff right sock, Don/doff left sock Shoes - Performed by patient: Don/doff right shoe, Don/doff left shoe Shoes - Performed by helper: Don/doff right shoe, Don/doff left shoe, Fasten left, Fasten right AFO - Performed by helper: Don/doff right AFO Assist for footwear: Maximal assist Assist for lower body dressing: (Mod A for dynamic sitting/standing balance)  Function - Toileting Toileting activity did not occur: No continent bowel/bladder event Toileting steps completed by helper: Adjust clothing prior to toileting, Performs perineal hygiene, Adjust clothing after toileting Toileting Assistive Devices: Grab bar or rail Assist level: Two helpers  Function - Air cabin crew transfer activity did not occur: Safety/medical concerns Toilet transfer assistive device: Elevated toilet seat/BSC over toilet Mechanical lift: Stedy Assist level  to toilet: Maximal assist (Pt 25 - 49%/lift and lower) Assist level from toilet: Maximal assist (Pt 25 - 49%/lift and lower)  Function - Chair/bed transfer Chair/bed transfer activity  did not occur: Safety/medical concerns Chair/bed transfer method: Stand pivot Chair/bed transfer assist level: Touching or steadying assistance (Pt > 75%) Chair/bed transfer assistive device: Armrests Chair/bed transfer details: Verbal cues for precautions/safety, Verbal cues for safe use of DME/AE, Manual facilitation for placement  Function - Locomotion: Wheelchair Will patient use wheelchair at discharge?: (TBD) Type: Manual Max wheelchair distance: 50 Assist Level: Supervision or verbal cues Assist Level: Supervision or verbal cues Assist Level: Supervision or verbal cues Turns around,maneuvers to table,bed, and toilet,negotiates 3% grade,maneuvers on rugs and over doorsills: No Function - Locomotion: Ambulation Ambulation activity did not occur: Safety/medical concerns Assistive device: Other (comment)(HW) Max distance: 50 Assist level: Moderate assist (Pt 50 - 74%) Assist level: Moderate assist (Pt 50 - 74%) Walk 50 feet with 2 turns activity did not occur: Safety/medical concerns Assist level: Moderate assist (Pt 50 - 74%) Walk 150 feet activity did not occur: Safety/medical concerns Walk 10 feet on uneven surfaces activity did not occur: Safety/medical concerns  Function - Comprehension Comprehension: Auditory Comprehension assist level: Understands basic 90% of the time/cues < 10% of the time  Function - Expression Expression: Verbal Expression assistive device: Communication board Expression assist level: Expresses basic 25 - 49% of the time/requires cueing 50 - 75% of the time. Uses single words/gestures.  Function - Social Interaction Social Interaction assist level: Interacts appropriately 50 - 74% of the time - May be physically or verbally inappropriate.  Function - Problem  Solving Problem solving assist level: Solves basic 75 - 89% of the time/requires cueing 10 - 24% of the time  Function - Memory Memory assist level: Recognizes or recalls 50 - 74% of the time/requires cueing 25 - 49% of the time Patient normally able to recall (first 3 days only): Current season, Location of own room, Staff names and faces, That he or she is in a hospital   Medical Problem List and Plan: 1.  Right hemiparesis, dysphagia, and aphasia secondary to large left MCA infarct with hemorrhagic transformation s/p hemicraniectomy.  Continue CIR  Helmet for safety OOB    2.  DVT Prophylaxis/Anticoagulation: Mechanical: Sequential compression devices, below knee Bilateral lower extremities 3. Pain Management: Tylenol as needed  -baclofen for spasms--increased to 10mg  TID--tolerating thus far  -splint/ROM 4. Mood: Consult LCSW  for evaluation and support 5. Neuropsych: This patient is not fully capable of making decisions on his own behalf. 6. Skin/Wound Care: Routine pressure relief measures.  Monitor wound for healing.  Maintain adequate nutritional status. 7. Fluids/Electrolytes/Nutrition: Monitor I's/O's.   Encourage nectar thick fluid intake.     8. Dysphagia: Continue dysphagia 3, nectar liquids--    -encourage fluids 9. Leucocytosis: ?Progressive and felt to be reactive--ID recommends d/c antibiotic and monitor for signs of infection.   Mother with history of CLL.  WBCs 10.5 8/2--recheck Friday 10. High homocysteine levels/low folate levels/boderline low B12: On high dose folic supplement, B6 and received B12 inj 7/14  . MRHFR--heterozygous /single mutation ID.  May cause abnormal clotting 11. GERD: Continue PPI.  12. Hyponatremia  Sodium 139 on 8/2 13. Neurogenic bladder:  -incontinent but retention improved  -continue bladder training, up to toilet  -no meds at this time, recent ucx neg   LOS (Days) 15 A FACE TO [place] 03/16/2018, 8:59 AM

## 2018-03-16 NOTE — Progress Notes (Signed)
Physical Therapy Session Note  Patient Details  Name: Xavier Villegas MRN: [id number] Date of Birth: [birth year]  Today's Date: 03/16/2018 PT Individual Time: 1345-1430 PT Individual Time Calculation (min): 45 min   Short Term Goals: Week 2:  PT Short Term Goal 1 (Week 2): Pt will initiate stair training with PT PT Short Term Goal 2 (Week 2): Pt will initiate stair training with PT PT Short Term Goal 3 (Week 2): Pt will amb 40 feet with +1 assist and LRAD  PT Short Term Goal 4 (Week 2): Pt will propel w/c using hemi technique navigating tight door passages and obstacles with supervision for 100 feet with <10% error PT Short Term Goal 5 (Week 2): Pt will perform stand pivot transfers and squat pivot transfers consistently with min A  Skilled Therapeutic Interventions/Progress Updates:    no indication of pain.  Session focus on NMR for RLE/RUE and cognitive remediation.   Pt completes toe taps to 3" step with LUE hand rail, 2 trials to fatigue, PT providing tactile cues and occasional facilitation at R knee for stabilization in stance phase.  Verbal cues throughout for upright posture, pacing, and activating RLE during stance phase.  Transition to standing task focus on RUE weight bearing and weight shifting R for RLE NMR during color matching game.  Facilitation and multimodal cues for R weight shift and to maintain RUE position in weight bearing.  Sitting RUE NMR/cognitive task with weight bearing through elbow for NMR during checkers task (moving pieces, not actually playing the game).  Pt requires assist to maintain weight bearing through elbow.  Attempted RLE NMR at [facility] with bias for RLE only but pt unable to elicit muscle contraction on command.  Returned to room in w/c with supervision.  Call bell in reach and needs met.   Therapy Documentation Precautions:  Precautions Precautions: Fall Precaution Comments: bone flap in L abdomen, no L side lying Restrictions Weight  Bearing Restrictions: No   See Function Navigator for Current Functional Status.   Therapy/Group: Individual Therapy  Michel Santee 03/16/2018, 2:34 PM

## 2018-03-16 NOTE — Progress Notes (Signed)
Occupational Therapy Session Note  Patient Details  Name: Xavier Villegas MRN: [id number] Date of Birth: [birth year]  Today's Date: 03/16/2018 OT Individual Time: 4098-1191 OT Individual Time Calculation (min): 29 min    Short Term Goals: Week 2:  OT Short Term Goal 1 (Week 2): Pt will complete toilet transfers with Mod A OT Short Term Goal 2 (Week 2): Pt will exhibit improved trunk control by completing 1/3 components of donning pants while sitting unsupported  OT Short Term Goal 3 (Week 2): Pt will complete 4/4 components of donning shirt with close supervision for balance   Skilled Therapeutic Interventions/Progress Updates:    Upon entering the room, pt seated in wheelchair awaiting OT arrival with no signs, or symptoms of pain. Pt propelled wheelchair from room 150' to ADL apartment with supervision and hemiplegic technique. Pt transferred from wheelchair >recliner chair with max A and min verbal cues to set up wheelchair and proper technique. Pt appearing to have soiled clothing and assisted back into wheelchair and returned to room. Pt transferred from wheelchair >toilet with max A for lifting and lowering. Pt required assistance to push clothing down. Pt unable to void further while seated on toilet but performed hygiene while seated with set up A. While sitting, pt playing LE into figure four position and threading clothing onto feet with steady assistance for balance. Pt standing with mod A for standing balance while pt pulling pants over B hips. Pt returning to wheelchair and alarm belt donned for safety. Call bell and all needed items within reach.   Therapy Documentation Precautions:  Precautions Precautions: Fall Precaution Comments: bone flap in L abdomen, no L side lying Restrictions Weight Bearing Restrictions: No General:   Vital Signs: Therapy Vitals Temp: 97.8 F (36.6 C) Temp Source: Oral Pulse Rate: 71 Resp: 18 BP: (!) 126/93 Patient Position (if  appropriate): Sitting Oxygen Therapy SpO2: 100 % O2 Device: Room Air  See Function Navigator for Current Functional Status.   Therapy/Group: Individual Therapy  [name] 03/16/2018, 4:02 PM

## 2018-03-16 NOTE — Progress Notes (Signed)
Speech Language Pathology Daily Session Note  Patient Details  Name: Xavier Villegas MRN: [id number] Date of Birth: Nov 03, 1980  Today's Date: 03/16/2018 SLP Individual Time: 1130-1200 SLP Individual Time Calculation (min): 30 min  Short Term Goals: Week 3: SLP Short Term Goal 1 (Week 3): Pt will demonstrate selective attention in mildly distracting environment for 45 minutes with Min A cues.  SLP Short Term Goal 2 (Week 3): Pt will complete mildly complex problem solving tasks with supervision A cues.  SLP Short Term Goal 3 (Week 3): Pt will self-monitor and self correct functional errors with supervision A verbal cues in problem solving tasks. SLP Short Term Goal 4 (Week 3): Pt will utilize multimodal communication to express wants/needs with Mod A cues in 8 out of 10 opportunities.  SLP Short Term Goal 5 (Week 3): Pt will locate items to the right of midline during basic, familiar tasks at Mod I.   Skilled Therapeutic Interventions: Skilled treatment session focused on communication goals. SLP facilitated session by providing Max A multimodal cues for patient to produce 2 vowels in isolation and to alternate between the 2 sounds. By end of session, patient demonstrated increased ability to self-monitor and correct errors at the phoneme level with overall Mod A multimodal cues. Patient left upright in wheelchair with family present. Continue with current plan of care.      Function:   Cognition Comprehension Comprehension assist level: Understands basic 90% of the time/cues < 10% of the time  Expression   Expression assist level: Expresses basic 25 - 49% of the time/requires cueing 50 - 75% of the time. Uses single words/gestures.  Social Interaction Social Interaction assist level: Interacts appropriately 50 - 74% of the time - May be physically or verbally inappropriate.  Problem Solving Problem solving assist level: Solves basic 50 - 74% of the time/requires cueing 25 - 49% of  the time  Memory Memory assist level: Recognizes or recalls 50 - 74% of the time/requires cueing 25 - 49% of the time    Pain No/Denies Pain  Therapy/Group: Individual Therapy  Amel Kitch 03/16/2018, 12:29 PM

## 2018-03-16 NOTE — Progress Notes (Signed)
Physical Therapy Session Note  Patient Details  Name: Xavier Villegas MRN: [id number] Date of Birth: 1981-03-23  Today's Date: 03/16/2018 PT Individual Time: 1000-1100 PT Individual Time Calculation (min): 60 min   Short Term Goals: Week 2:  PT Short Term Goal 1 (Week 2): Pt will initiate stair training with PT PT Short Term Goal 2 (Week 2): Pt will initiate stair training with PT PT Short Term Goal 3 (Week 2): Pt will amb 40 feet with +1 assist and LRAD  PT Short Term Goal 4 (Week 2): Pt will propel w/c using hemi technique navigating tight door passages and obstacles with supervision for 100 feet with <10% error PT Short Term Goal 5 (Week 2): Pt will perform stand pivot transfers and squat pivot transfers consistently with min A  Skilled Therapeutic Interventions/Progress Updates:    Pt gestures no pain prior to session start. Propels w/c from room> gym for 75 feet using L hemi technique and supervision for R visual scanning to avoid obstacles. x2 gait trials using HW for 42' & 61' with therapist providing mod A for weight shifting with Pt able to advance R LE with occasional mod A 2/2 fatigue for proper stepping. Therapist provided multimodal cues throughout ambulation trials for proper sequencing with HW and maintaining adequate step length with L LE to progress towards equal step length bilaterally. Pt consistently performs stand pivot transfers and STS throughout session with min A and supervision for squat pivot transfer from mat> w/c towards his stronger L LE side. Pt performs kicking ball activity for dynamic standing balance with L LE with therapist facilitating R weight shift and tactile feedback for quad activation to allow advancement of L LE into swing phase for kicking. Pt performs multiple trials of ball kicking with good carry over and mod A provided throughout activity. Pt performs static progressing to dynamic standing balance activity incorporating 3 inch block under L LE  for increased R weight shift during card matching task. Pt performs squat pivot from [employer] supervision and therapist propels w/c to room for time management. Pt returned to room in sitting with tray table and call bell in reach, all needs met, and family present.  Therapy Documentation Precautions:  Precautions Precautions: Fall Precaution Comments: bone flap in L abdomen, no L side lying Restrictions Weight Bearing Restrictions: No   See Function Navigator for Current Functional Status.   Therapy/Group: Individual Therapy  Floreen Comber 03/16/2018, 1:01 PM

## 2018-03-17 ENCOUNTER — Inpatient Hospital Stay (HOSPITAL_COMMUNITY): Payer: BLUE CROSS/BLUE SHIELD | Admitting: Speech Pathology

## 2018-03-17 ENCOUNTER — Inpatient Hospital Stay (HOSPITAL_COMMUNITY): Payer: BLUE CROSS/BLUE SHIELD | Admitting: Physical Therapy

## 2018-03-17 ENCOUNTER — Inpatient Hospital Stay (HOSPITAL_COMMUNITY): Payer: BLUE CROSS/BLUE SHIELD | Admitting: Occupational Therapy

## 2018-03-17 NOTE — Progress Notes (Signed)
Occupational Therapy Session Note  Patient Details  Name: Xavier Villegas MRN: [id number] Date of Birth: Mar 20, 1981  Today's Date: 03/17/2018 OT Individual Time: 1045-1130 OT Individual Time Calculation (min): 45 min  and Today's Date: 03/17/2018 OT Co-Treatment Time: 1130-1200 OT Co-Treatment Time Calculation (min): 30 min   Short Term Goals: Week 2:  OT Short Term Goal 1 (Week 2): Pt will complete toilet transfers with Mod A OT Short Term Goal 2 (Week 2): Pt will exhibit improved trunk control by completing 1/3 components of donning pants while sitting unsupported  OT Short Term Goal 3 (Week 2): Pt will complete 4/4 components of donning shirt with close supervision for balance   Skilled Therapeutic Interventions/Progress Updates:    Upon entering the room, pt seated in recliner chair with aunt present in room. Pt agreeable to shower this session. Stand pivot transfer from recliner >wheelchair with mod A. Pt transferred into TTB towards L side with max A for safety. Pt seated on TTB for bathing at shower level with supervision- steady assistance for dynamic sitting balance. Pt required increased time and min verbal guidance cues for sequencing. Pt transferred back into wheelchair in same manner and dressing from wheelchair at sink. Sit <> stand with min A to pull pants over B hips. Pt utilizing figure four position to thread clothing onto LB and don socks and shoes. OT assisted pt to day room via wheelchair for skilled co-treat with SLP. Pt standing for 2 bouts of 10 minutes each with min A for weight bearing through R UE and blocked R knee. Pt standing for card game with focus on cognition, speech, and R visual scanning. Pt with 1 LOB while standing when reaching to R requiring mod A to correct. Pt returned to room and seated in recliner chair with chair alarm and all needs within reach reach. Lunch tray set up as therapist exited the room.  Therapy Documentation Precautions:   Precautions Precautions: Fall Precaution Comments: bone flap in L abdomen, no L side lying Restrictions Weight Bearing Restrictions: No  See Function Navigator for Current Functional Status.   Therapy/Group: Individual Therapy  [name] 03/17/2018, 12:49 PM

## 2018-03-17 NOTE — Progress Notes (Signed)
Subjective/Complaints: No new complaints. Denies pain  ROS: limited due to language/communication    Objective: Vital Signs: Blood pressure 116/83, pulse 67, temperature 97.6 F (36.4 C), temperature source Oral, resp. rate 18, height 6' (1.829 m), weight 83.2 kg, SpO2 100 %. No results found. No results found for this or any previous visit (from the past 72 hour(s)).   Constitutional: No distress . Vital signs reviewed. HEENT: EOMI, oral membranes moist Neck: supple Cardiovascular: RRR without murmur. No JVD    Respiratory: CTA Bilaterally without wheezes or rales. Normal effort    GI: BS +, non-tender, non-distended  Neuro:  Expressive language deficits. Improving yes/no Motor: lUE/LLE: 5/5 proximal distal RUE/RLE; 0/5 proximal distal (unchanged)  MAS: Left elbow and finger flexors 1/4, right ankle and plantar flexors tr to 1/4, toes up, DTR's 3+ Musc/Skel:  No edema or tenderness in extremities Psych: pleasant and cooperative Skin: wounds cdi   Assessment/Plan: 1. Functional deficits secondary to left MCA infarct with hemorrhagic transformation status post craniectomy which require 3+ hours per day of interdisciplinary therapy in a comprehensive inpatient rehab setting. Physiatrist is providing close team supervision and 24 hour management of active medical problems listed below. Physiatrist and rehab team continue to assess barriers to discharge/monitor patient progress toward functional and medical goals. FIM: Function - Bathing Bathing activity did not occur: Refused Position: Shower Body parts bathed by patient: Right arm, Chest, Abdomen, Front perineal area, Right upper leg, Left upper leg, Left lower leg Body parts bathed by helper: Left arm, Back, Buttocks Assist Level: Touching or steadying assistance(Pt > 75%)  Function- Upper Body Dressing/Undressing What is the patient wearing?: Pull over shirt/dress Pull over shirt/dress - Perfomed by patient:  Thread/unthread left sleeve, Put head through opening, Pull shirt over trunk, Thread/unthread right sleeve Pull over shirt/dress - Perfomed by helper: Thread/unthread right sleeve Assist Level: Touching or steadying assistance(Pt > 75%)(for balance) Function - Lower Body Dressing/Undressing What is the patient wearing?: Pants, Socks, Shoes, AFO Position: Wheelchair/chair at sink Pants- Performed by patient: Thread/unthread left pants leg, Pull pants up/down, Thread/unthread right pants leg Pants- Performed by helper: Thread/unthread left pants leg, Thread/unthread right pants leg, Pull pants up/down Non-skid slipper socks- Performed by patient: Don/doff right sock, Don/doff left sock Non-skid slipper socks- Performed by helper: Don/doff right sock, Don/doff left sock Socks - Performed by patient: Don/doff right sock, Don/doff left sock Socks - Performed by helper: Don/doff right sock, Don/doff left sock Shoes - Performed by patient: Don/doff right shoe, Don/doff left shoe Shoes - Performed by helper: Don/doff right shoe, Don/doff left shoe, Fasten left, Fasten right AFO - Performed by helper: Don/doff right AFO Assist for footwear: Maximal assist Assist for lower body dressing: (Mod A for dynamic sitting/standing balance)  Function - Toileting Toileting activity did not occur: No continent bowel/bladder event Toileting steps completed by patient: Adjust clothing after toileting, Performs perineal hygiene Toileting steps completed by helper: Adjust clothing prior to toileting Toileting Assistive Devices: Grab bar or rail Assist level: (mod A)  Function - Air cabin crew transfer activity did not occur: Safety/medical concerns Toilet transfer assistive device: Elevated toilet seat/BSC over toilet Mechanical lift: Stedy Assist level to toilet: Maximal assist (Pt 25 - 49%/lift and lower) Assist level from toilet: Maximal assist (Pt 25 - 49%/lift and lower)  Function - Chair/bed  transfer Chair/bed transfer activity did not occur: Safety/medical concerns Chair/bed transfer method: Stand pivot Chair/bed transfer assist level: Touching or steadying assistance (Pt > 75%) Chair/bed transfer assistive device: Mechanical lift  Chair/bed transfer details: Verbal cues for precautions/safety, Verbal cues for safe use of DME/AE, Manual facilitation for placement  Function - Locomotion: Wheelchair Will patient use wheelchair at discharge?: Yes(tbd) Type: Manual Max wheelchair distance: 75 Assist Level: Supervision or verbal cues Assist Level: Supervision or verbal cues Assist Level: Supervision or verbal cues Turns around,maneuvers to table,bed, and toilet,negotiates 3% grade,maneuvers on rugs and over doorsills: No Function - Locomotion: Ambulation Ambulation activity did not occur: Safety/medical concerns Assistive device: Other (comment) Max distance: 61 Assist level: Moderate assist (Pt 50 - 74%) Assist level: Moderate assist (Pt 50 - 74%) Walk 50 feet with 2 turns activity did not occur: Safety/medical concerns Assist level: Moderate assist (Pt 50 - 74%) Walk 150 feet activity did not occur: Safety/medical concerns Walk 10 feet on uneven surfaces activity did not occur: Safety/medical concerns  Function - Comprehension Comprehension: Auditory Comprehension assist level: Understands basic 90% of the time/cues < 10% of the time  Function - Expression Expression: Verbal Expression assistive device: Communication board Expression assist level: Expresses basic 25 - 49% of the time/requires cueing 50 - 75% of the time. Uses single words/gestures.  Function - Social Interaction Social Interaction assist level: Interacts appropriately 75 - 89% of the time - Needs redirection for appropriate language or to initiate interaction., Interacts appropriately 90% of the time - Needs monitoring or encouragement for participation or interaction.  Function - Problem  Solving Problem solving assist level: Solves basic 50 - 74% of the time/requires cueing 25 - 49% of the time  Function - Memory Memory assist level: Recognizes or recalls 50 - 74% of the time/requires cueing 25 - 49% of the time Patient normally able to recall (first 3 days only): Current season, Location of own room, Staff names and faces, That he or she is in a hospital   Medical Problem List and Plan: 1.  Right hemiparesis, dysphagia, and aphasia secondary to large left MCA infarct with hemorrhagic transformation s/p hemicraniectomy.  Continue CIR  Continue Helmet for safety OOB    2.  DVT Prophylaxis/Anticoagulation: Mechanical: Sequential compression devices, below knee Bilateral lower extremities 3. Pain Management: Tylenol as needed  -baclofen 10mg  TID--tolerating thus far, consider further titration  -splint/ROM 4. Mood: Consult LCSW  for evaluation and support 5. Neuropsych: This patient is not fully capable of making decisions on his own behalf. 6. Skin/Wound Care: Routine pressure relief measures.  Monitor wound for healing.  Maintain adequate nutritional status. 7. Fluids/Electrolytes/Nutrition: Monitor I's/O's.   Encourage nectar thick fluid intake.     8. Dysphagia: Continue dysphagia 3, nectar liquids--    -encourage fluids 9. Leucocytosis: ?Progressive and felt to be reactive--ID recommends d/c antibiotic and monitor for signs of infection.   Mother with history of CLL.  WBCs 10.5 8/2--recheck tomorrow 10. High homocysteine levels/low folate levels/boderline low B12: On high dose folic supplement, B6 and received B12 inj 7/14  . MRHFR--heterozygous /single mutation ID.  May cause abnormal clotting 11. GERD: Continue PPI.  12. Hyponatremia  Sodium 139 on 8/2 13. Neurogenic bladder:  -incontinent but retention improved  -continue bladder training, up to toilet, encouraged him to reach out to RN's when he needs to empty bladder  -no meds at this time, recent ucx neg    LOS (Days) Weed 03/17/2018, 8:51 AM

## 2018-03-17 NOTE — Progress Notes (Signed)
Speech Language Pathology Daily Session Note  Patient Details  Name: Xavier Villegas MRN: [id number] Date of Birth: June 19, 1981  Today's Date: 03/17/2018 SLP Co-Treatment Time: 1130-1200 SLP Co-Treatment Time Calculation (min): 30 min  Short Term Goals: Week 3: SLP Short Term Goal 1 (Week 3): Pt will demonstrate selective attention in mildly distracting environment for 45 minutes with supervision cues.  SLP Short Term Goal 2 (Week 3): Pt will complete mildly complex problem solving tasks with Min A cues.  SLP Short Term Goal 3 (Week 3): Pt will self-monitor and self correct functional errors with supervision A verbal cues in problem solving tasks. SLP Short Term Goal 4 (Week 3): Pt will utilize multimodal communication to express wants/needs with Min A cues in 9 out of 10 opportunities.  SLP Short Term Goal 5 (Week 3): Pt will locate items to the right of midline during basic, familar tasks with Min A cues.  SLP Short Term Goal 6 (Week 3): Pt will produce vocalizations in 5 out of 10 opportunities with Mod A cues.   Skilled Therapeutic Interventions:   Skilled treatment session #1 focused on cognition. SLP co-treated with OT to facilitate cognition within standing. SLP facilitated session by providing Mod A faded to Min A to deal cards into 3 piles. Pt required Mod A to scan to right, Mod I for selective attention and Mod A faded to [place] A for semi-complex problem solving. Pt with increased spontaneous vocalizations "hey, alright etc" Pt required Max A to imitate gestures (thumbs up/thumbs down) d/t LUE apraxia. Pt able to sing common phrases without written print but with verbal SLP model.   Skilled treatment session focused #2 focused on cognition. SLP facilitated session by sorting cards into various groups with Mod A faded to Min A cues for right attention, Mod A for organization of task then faded to Min A while facilitating approximations of different vowel sounds. Pt able to sing "how  are you without written print" but with verbal model from SLP. Pt continues to effectively communicate basic wants/needs via gestures and facial expressions. He also self-corrects these gestures if listener/partner doesn't understand.      Function:    Cognition Comprehension Comprehension assist level: Understands basic 90% of the time/cues < 10% of the time  Expression   Expression assist level: Expresses basic 25 - 49% of the time/requires cueing 50 - 75% of the time. Uses single words/gestures.  Social Interaction Social Interaction assist level: Interacts appropriately 90% of the time - Needs monitoring or encouragement for participation or interaction.  Problem Solving Problem solving assist level: Solves basic 90% of the time/requires cueing < 10% of the time;Solves basic 75 - 89% of the time/requires cueing 10 - 24% of the time  Memory Memory assist level: Recognizes or recalls 90% of the time/requires cueing < 10% of the time    Pain    Therapy/Group: Individual Therapy  Ester Hilley 03/17/2018, 2:04 PM

## 2018-03-17 NOTE — Progress Notes (Signed)
Social Work Patient ID: Xavier Villegas, male   DOB: 02/09/1981, 36 y.o.   MRN: [id number]   CSW met with pt and his father, aunt, and friend on 03-16-18 to update them on team conference discussion.  Pt/family were pleased to learn of d/c date of 04-06-18.  Pt smiled and half nodded when he answered CSW's question "Are you tired of us yet?"  But, he also nodded that he's willing to stay and work hard so that he can make progress.  Privately, pt's father asked CSW if they needed to apply pt for social security disability.  CSW to follow up with Dr. Swartz and advise pt's father accordingly.  Family remains committed to taking pt home at d/c and asked about f/u therapies, DME, etc.  CSW will be available as needed.   

## 2018-03-17 NOTE — Patient Care Conference (Signed)
Inpatient RehabilitationTeam Conference and Plan of Care Update Date: 03/15/2018   Time: 2:15 PM    Patient Name: Xavier Villegas      Medical Record Number: [id number]  Date of Birth: 07-Aug-1981 Sex: Male         Room/Bed: 4W12C/4W12C-01 Payor Info: Payor: [place] / Plan: [employer]BS OTHER / Product Type: *No Product type* /    Admitting Diagnosis: CVA  Admit Date/Time:  03/01/2018  6:01 PM Admission Comments: No comment available   Primary Diagnosis:  <principal problem not specified> Principal Problem: <principal problem not specified>  Patient Active Problem List   Diagnosis Date Noted  . Hyponatremia   . Cerebral infarction due to embolism of left middle cerebral artery (HCC) s/p thrombectomy 03/01/2018  . Carotid artery dissection ([place]) Left 03/01/2018  . Acute ischemic right MCA stroke ([place]) 03/01/2018  . Right hemiparesis ([place])   . Dysphagia, post-stroke   . Global aphasia   . Leukocytosis   . Folic acid deficiency 09/98/3382  . B12 deficiency 02/23/2018  . Hyperhomocysteinemia ([place]) 02/23/2018  . Smoker     Expected Discharge Date: Expected Discharge Date: 04/06/18  Team Members Present: Physician leading conference: Dr. Alger Simons Social Worker Present: Alfonse Alpers, LCSW Nurse Present: Blair Heys, RN PT Present: Dwyane Dee, PT OT Present: Benay Pillow, OT SLP Present: Charolett Bumpers, SLP     Current Status/Progress Goal Weekly Team Focus  Medical   ongoing aphasia. improved tone. emptygin bladder but incontinent  improve activity tolerance  bladder, spasticity, language   Bowel/Bladder   continent of bowel, incontinent of bladder, LBM 8/5  continue continence of bowel & regain bladder continence  timed toileting program    Swallow/Nutrition/ Hydration   Regular/thin Mod I, set up assist   Mod I - upgraded and met       ADL's   Mod-Max A toilet transfers + Stedy for shower transfer. Mod A for balance assist during dynamic  ADL tasks.   Supervision-Min A   Rt NMR, cognition, balance, functional transfers, pt/family education   Mobility   mod A for gait at rail and min A for transfers   min guard/assist overall  R attention, NMR, balance, functional mobility, cognitive remediation   Communication   Max A   Mod A  communication board yes/no, spelling on communication board, melodic intonation therapy for spontaneous speech   Safety/Cognition/ Behavioral Observations  Min A  Min A, Sup A  basic-mildly complex problem solvig, error awareness, selective attention    Pain   c/o pain 1-4/10 intermittently, no c/o last night, has tylenol prn, scheduled norco, baclofen & muscle rub, he refused the muscle rub last night  pain scale <3  continue to assess & treat as needed   Skin   bone flap left abdominal incision w/honeycomb drs, incision to the left scalp staples have been removed & OTA, MASD perineal area  pt will be free from infection no further skin breakdown improvement of MASD  assess skin q shift    Rehab Goals Patient on target to meet rehab goals: Yes Rehab Goals Revised: none *See Care Plan and progress notes for long and short-term goals.     Barriers to Discharge  Current Status/Progress Possible Resolutions Date Resolved   Physician    Medical stability        see medical progress notes      Nursing  Incontinence  PT                    OT                  SLP                SW                Discharge Planning/Teaching Needs:  Pt to return to his home with his parents and aunt to provide the recommended supervision/assist needed.  Family education to occur closer to d/c.   Team Discussion:  Pt is progressing.  He is emptying his bladder, but has had some incontinent episodes.  Pt's spasticity is better and he is tolerating medications that are helping.  Pt's UE is slow to return, but pt is upbeat and motivated.  Pt is on a regular diet now and can take pills with water.  He  still has a honeycomb dressing, pain is under control, and he is able to follow directions and feed himself per RN.  Pt is mod A for toilet and shower transfers and moving away from using the stedy.  Pt is mod A with balance, especially with LB dressing in standing.  Pt is min A for UB and he is scanning to the right better.  Pt is mod A with gait at the rail with S to min A goals.  Trial of hemi walker on day of conference.  Pt needs full supervision for meals, but will likely move to intermittent S soon.  ST will upgrade goal to mod I swallow.  His yes/no answers are 90% accurate.  ST will work on verbal output, problem solving with mildly complex tasks.   Revisions to Treatment Plan:  none    Continued Need for Acute Rehabilitation Level of Care: The patient requires daily medical management by a physician with specialized training in physical medicine and rehabilitation for the following conditions: Daily direction of a multidisciplinary physical rehabilitation program to ensure safe treatment while eliciting the highest outcome that is of practical value to the patient.: Yes Daily medical management of patient stability for increased activity during participation in an intensive rehabilitation regime.: Yes Daily analysis of laboratory values and/or radiology reports with any subsequent need for medication adjustment of medical intervention for : Neurological problems  Demetri Goshert, [name] 03/17/2018, 11:49 AM

## 2018-03-17 NOTE — Progress Notes (Signed)
Physical Therapy Weekly Progress Note  Patient Details  Name: Xavier Villegas MRN: [id number] Date of Birth: Jan 02, 1981  Beginning of progress report period: March 11, 2018 End of progress report period: March 17, 2018  Today's Date: 03/17/2018 PT Individual Time: 1345-1430 PT Individual Time Calculation (min): 45 min   Patient has met 5 of 5 short term goals.  Pt continues to make excellent progress with mobility.  Currently performing stand and squat/pivot transfers with min guard to R and L with verbal cues for safety.  Gait with hemiwalker up to 14' mod/max assist dependent upon fatigue.  Patient continues to demonstrate the following deficits muscle weakness and muscle paralysis, impaired timing and sequencing, abnormal tone, unbalanced muscle activation and decreased coordination, ?field cut, decreased attention to right, decreased attention, decreased awareness, decreased problem solving and decreased safety awareness and decreased sitting balance, decreased standing balance, decreased postural control, hemiplegia and decreased balance strategies and therefore will continue to benefit from skilled PT intervention to increase functional independence with mobility.  Patient progressing toward long term goals..  Continue plan of care.  PT Short Term Goals Week 2:  PT Short Term Goal 1 (Week 2): Pt will initiate stair training with PT PT Short Term Goal 1 - Progress (Week 2): Met PT Short Term Goal 2 (Week 2): Pt will initiate stair training with PT PT Short Term Goal 2 - Progress (Week 2): Met PT Short Term Goal 3 (Week 2): Pt will amb 40 feet with +1 assist and LRAD  PT Short Term Goal 3 - Progress (Week 2): Met PT Short Term Goal 4 (Week 2): Pt will propel w/c using hemi technique navigating tight door passages and obstacles with supervision for 100 feet with <10% error PT Short Term Goal 4 - Progress (Week 2): Met PT Short Term Goal 5 (Week 2): Pt will perform stand pivot  transfers and squat pivot transfers consistently with min A PT Short Term Goal 5 - Progress (Week 2): Met Week 3:  PT Short Term Goal 1 (Week 3): Pt will ambulate 34' with LRAD and min assist PT Short Term Goal 2 (Week 3): Pt will negotiate 4 steps with LHR and max assist PT Short Term Goal 3 (Week 3): Pt will propel w/c in community environment with min assist and mod verbal cues for R attention   Skilled Therapeutic Interventions/Progress Updates:    no indication of pain.  Session focus on RLE NMR, balance, and gait.    Pt transfers in and out of w/c throughout session with min guard.  Standing NMR focus on R weight shift and RLE activation with mod/max assist for balance (no UE support) with forward/retro stepping with LLE.  Facilitation for R weight shift and R quad activation in stance phase.  RLE bridging with facilitation for mm activation 2x10 reps followed by x10 reps quad sets with inconsistent trace activation of quads noted.  Attempted to have pt feel trace activation for tactile feedback, but pt unable to comprehend task.  Gait training 2x65' with hemiwalker and overall mod assist, occasional max with too large step length.  Facilitation for R and L weight shift, and tactile cues for RLE stabilization in stance phase.  Pt returned to room at end of session and positioned in recliner with chair alarm intact, call bell in reach and needs met.   Therapy Documentation Precautions:  Precautions Precautions: Fall Precaution Comments: bone flap in L abdomen, no L side lying Restrictions Weight Bearing Restrictions: No  See Function Navigator for Current Functional Status.  Therapy/Group: Individual Therapy  Michel Santee 03/17/2018, 4:52 PM

## 2018-03-18 ENCOUNTER — Inpatient Hospital Stay (HOSPITAL_COMMUNITY): Payer: BLUE CROSS/BLUE SHIELD | Admitting: Occupational Therapy

## 2018-03-18 ENCOUNTER — Inpatient Hospital Stay (HOSPITAL_COMMUNITY): Payer: BLUE CROSS/BLUE SHIELD | Admitting: Physical Therapy

## 2018-03-18 ENCOUNTER — Inpatient Hospital Stay (HOSPITAL_COMMUNITY): Payer: BLUE CROSS/BLUE SHIELD | Admitting: Speech Pathology

## 2018-03-18 NOTE — Progress Notes (Signed)
Patient has slept throughout this shift. Telesitter remains. Patient has been compliant with safety measures and has followed commands. Patient gestures and nods. Patient does however kick SCDs and boot off. Will continue to monitor.

## 2018-03-18 NOTE — Progress Notes (Signed)
Physical Therapy Session Note  Patient Details  Name: Xavier Villegas MRN: [id number] Date of Birth: 10/14/1980  Today's Date: 03/18/2018 PT Individual Time: 4827-0786 PT Individual Time Calculation (min): 55 min   Short Term Goals: Week 3:  PT Short Term Goal 1 (Week 3): Pt will ambulate 9' with LRAD and min assist PT Short Term Goal 2 (Week 3): Pt will negotiate 4 steps with LHR and max assist PT Short Term Goal 3 (Week 3): Pt will propel w/c in community environment with min assist and mod verbal cues for R attention   Skilled Therapeutic Interventions/Progress Updates:   Pt in supine and agreeable to therapy, denies pain. Helmet donned throughout session. Transferred to EOB w/ min assist to stabilize in sitting and transferred to w/c via squad pivot w/ min assist. Performed toilet transfer to/from [facility] via same technique, per RN request, however did not void. Pt self-propelled w/c to/from therapy gym w/ supervision using L hemi technique. Focused on R NMR and quad activation this session during pre-gait tasks and during gait. Performed step forward and backward w/ LLE, lateral weight shifting, and quad sets. Tactile, manual, and verbal cues for quad activation. Able to contract ~50% of the time w/o hyperextension thrust. Additionally performed R step forward and backward to target and kicking beach ball w/ tactile cues for hip flexor activation. Min assist overall for standing balance w/ hemiwalker support. Ambulated 30' x2 w/ hemiwalker, mod assist overall for upright balance, facilitation of lateral weight shifting, and swing limb advancement. No assist needed for R quad control in single leg stance. Finished session on kinetron, 50 cm/sec 3 min x2 reps, w/ tactile cues for RLE muscle activation. Ended session in w/c and in care of family, all needs met.   Therapy Documentation Precautions:  Precautions Precautions: Fall Precaution Comments: bone flap in L abdomen, no L side  lying Restrictions Weight Bearing Restrictions: No Vital Signs: Therapy Vitals Temp: 97.9 F (36.6 C) Temp Source: Oral Pulse Rate: 63 Resp: 17 BP: 116/77 Patient Position (if appropriate): Lying Oxygen Therapy SpO2: 99 % O2 Device: Room Air  See Function Navigator for Current Functional Status.   Therapy/Group: Individual Therapy  Peace Noyes K Arnette 03/18/2018, 5:06 PM

## 2018-03-18 NOTE — Progress Notes (Signed)
Subjective/Complaints: Up in w/c. Slept well. Dad with him.   ROS: Patient denies fever, rash, sore throat, blurred vision, nausea, vomiting, diarrhea, cough, shortness of breath or chest pain, joint or back pain, headache, or mood change.    Objective: Vital Signs: Blood pressure 117/61, pulse 66, temperature 97.8 F (36.6 C), temperature source Oral, resp. rate 18, height 6' (1.829 m), weight 83.2 kg, SpO2 95 %. No results found. No results found for this or any previous visit (from the past 72 hour(s)).   Constitutional: No distress . Vital signs reviewed. HEENT: EOMI, oral membranes moist Neck: supple Cardiovascular: RRR without murmur. No JVD    Respiratory: CTA Bilaterally without wheezes or rales. Normal effort    GI: BS +, non-tender, non-distended  Neuro:  Expressive language deficits. Improving yes/no, initiates occasional word/phrase Motor: lUE/LLE: 5/5 proximal distal RUE/RLE; 0/5 proximal distal---has trace to 1+ prox right HF/HE  MAS: Left elbow and finger flexors 1/4, right ankle and plantar flexors tr to 1/4, toes up, DTR's remain 3+ Right Musc/Skel:  No edema or tenderness in extremities Psych: pleasant and cooperative Skin: wounds cdi   Assessment/Plan: 1. Functional deficits secondary to left MCA infarct with hemorrhagic transformation status post craniectomy which require 3+ hours per day of interdisciplinary therapy in a comprehensive inpatient rehab setting. Physiatrist is providing close team supervision and 24 hour management of active medical problems listed below. Physiatrist and rehab team continue to assess barriers to discharge/monitor patient progress toward functional and medical goals. FIM: Function - Bathing Bathing activity did not occur: Refused Position: Shower Body parts bathed by patient: Right arm, Chest, Abdomen, Front perineal area, Right upper leg, Left upper leg, Left lower leg, Right lower leg Body parts bathed by helper: Left arm,  Back, Buttocks Assist Level: Touching or steadying assistance(Pt > 75%)  Function- Upper Body Dressing/Undressing What is the patient wearing?: Pull over shirt/dress Pull over shirt/dress - Perfomed by patient: Thread/unthread left sleeve, Put head through opening, Pull shirt over trunk, Thread/unthread right sleeve Pull over shirt/dress - Perfomed by helper: Thread/unthread right sleeve Assist Level: Supervision or verbal cues, Set up Set up : To obtain clothing/put away Function - Lower Body Dressing/Undressing What is the patient wearing?: Pants, Socks, Shoes Position: Wheelchair/chair at sink Pants- Performed by patient: Thread/unthread left pants leg, Pull pants up/down, Thread/unthread right pants leg Pants- Performed by helper: Thread/unthread left pants leg, Thread/unthread right pants leg, Pull pants up/down Non-skid slipper socks- Performed by patient: Don/doff right sock, Don/doff left sock Non-skid slipper socks- Performed by helper: Don/doff right sock, Don/doff left sock Socks - Performed by patient: Don/doff right sock, Don/doff left sock Socks - Performed by helper: Don/doff right sock, Don/doff left sock Shoes - Performed by patient: Don/doff right shoe, Don/doff left shoe Shoes - Performed by helper: Don/doff right shoe, Don/doff left shoe, Fasten left, Fasten right AFO - Performed by helper: Don/doff right AFO Assist for footwear: Maximal assist Assist for lower body dressing: Touching or steadying assistance (Pt > 75%)  Function - Toileting Toileting activity did not occur: No continent bowel/bladder event Toileting steps completed by patient: Adjust clothing after toileting, Performs perineal hygiene Toileting steps completed by helper: Adjust clothing prior to toileting Toileting Assistive Devices: Grab bar or rail Assist level: (mod A)  Function - Air cabin crew transfer activity did not occur: Safety/medical concerns Toilet transfer assistive device:  Elevated toilet seat/BSC over toilet Mechanical lift: Stedy Assist level to toilet: Maximal assist (Pt 25 - 49%/lift and lower) Assist level  from toilet: Maximal assist (Pt 25 - 49%/lift and lower)  Function - Chair/bed transfer Chair/bed transfer activity did not occur: Safety/medical concerns Chair/bed transfer method: Stand pivot Chair/bed transfer assist level: Moderate assist (Pt 50 - 74%/lift or lower) Chair/bed transfer assistive device: Mechanical lift Chair/bed transfer details: Verbal cues for precautions/safety, Verbal cues for safe use of DME/AE, Manual facilitation for placement  Function - Locomotion: Wheelchair Will patient use wheelchair at discharge?: Yes(tbd) Type: Manual Max wheelchair distance: 75 Assist Level: Supervision or verbal cues Assist Level: Supervision or verbal cues Assist Level: Supervision or verbal cues Turns around,maneuvers to table,bed, and toilet,negotiates 3% grade,maneuvers on rugs and over doorsills: No Function - Locomotion: Ambulation Ambulation activity did not occur: Safety/medical concerns Assistive device: Other (comment) Max distance: 61 Assist level: Moderate assist (Pt 50 - 74%) Assist level: Moderate assist (Pt 50 - 74%) Walk 50 feet with 2 turns activity did not occur: Safety/medical concerns Assist level: Moderate assist (Pt 50 - 74%) Walk 150 feet activity did not occur: Safety/medical concerns Walk 10 feet on uneven surfaces activity did not occur: Safety/medical concerns  Function - Comprehension Comprehension: Auditory Comprehension assist level: Follows basic conversation/direction with extra time/assistive device  Function - Expression Expression: Verbal, Nonverbal Expression assistive device: Communication board Expression assist level: Expresses basic 25 - 49% of the time/requires cueing 50 - 75% of the time. Uses single words/gestures.  Function - Social Interaction Social Interaction assist level: Interacts  appropriately 90% of the time - Needs monitoring or encouragement for participation or interaction.  Function - Problem Solving Problem solving assist level: Solves basic 90% of the time/requires cueing < 10% of the time, Solves basic problems with no assist  Function - Memory Memory assist level: More than reasonable amount of time Patient normally able to recall (first 3 days only): Current season, Location of own room, Staff names and faces, That he or she is in a hospital   Medical Problem List and Plan: 1.  Right hemiparesis, dysphagia, and aphasia secondary to large left MCA infarct with hemorrhagic transformation s/p hemicraniectomy.  Continue CIR  Continue Helmet for safety OOB    2.  DVT Prophylaxis/Anticoagulation: Mechanical: Sequential compression devices, below knee Bilateral lower extremities 3. Pain Management: Tylenol as needed  -baclofen 10mg  TID--tolerating thus far, consider further titration next week  -splint/ROM 4. Mood: Consult LCSW  for evaluation and support 5. Neuropsych: This patient is not fully capable of making decisions on his own behalf. 6. Skin/Wound Care: Routine pressure relief measures.  Monitor wound for healing.  Maintain adequate nutritional status. 7. Fluids/Electrolytes/Nutrition: Monitor I's/O's.   Encourage nectar thick fluid intake.     8. Dysphagia: Continue dysphagia 3, nectar liquids--    -pushing fluids 9. Leucocytosis: ?Progressive and felt to be reactive--ID recommends d/c antibiotic and monitor for signs of infection.   Mother with history of CLL.  WBCs 10.5 8/2--labs ordered for Monday 10. High homocysteine levels/low folate levels/boderline low B12: On high dose folic supplement, B6 and received B12 inj 7/14  . MRHFR--heterozygous /single mutation ID.  May cause abnormal clotting 11. GERD: Continue PPI.  12. Hyponatremia  Sodium 139 on 8/2 13. Neurogenic bladder:  -incontinent but retention improved  -continue bladder training,  up to toilet, encouraged him to reach out to RN's when he needs to empty bladder  -no meds at this time, recent ucx neg   LOS (Days) 17 A FACE TO [place] 03/18/2018, 9:28 AM

## 2018-03-18 NOTE — Plan of Care (Signed)
  Problem: RH SAFETY Goal: RH STG ADHERE TO SAFETY PRECAUTIONS W/ASSISTANCE/DEVICE Description STG Adhere to Safety Precautions With mod I Assistance/Device. Telesitter use as needed  Outcome: Progressing  Call light, camera, proper footwear, family sitter

## 2018-03-18 NOTE — Progress Notes (Signed)
Occupational Therapy Session Note  Patient Details  Name: Xavier Villegas MRN: [id number] Date of Birth: 10/28/1980  Today's Date: 03/18/2018 OT Individual Time: 9758-8325 OT Individual Time Calculation (min): 61 min   Short Term Goals: Week 2:  OT Short Term Goal 1 (Week 2): Pt will complete toilet transfers with Mod A OT Short Term Goal 2 (Week 2): Pt will exhibit improved trunk control by completing 1/3 components of donning pants while sitting unsupported  OT Short Term Goal 3 (Week 2): Pt will complete 4/4 components of donning shirt with close supervision for balance   Skilled Therapeutic Interventions/Progress Updates:    Pt greeted in w/c with father present. Declining shower, but agreeable to try toileting. First donned his  Rt aircast and pt assisted therapist with fastening velcro with instruction. He donned Rt shoe with close supervision for balance. After donning helmet, stand pivot<toilet completed with Max A and Rt side supported. With increased time, pt unable to void B/B. He was able to elevate pants over hips with Mod A for balance support before transferring back to w/c. Handwashing completed standing at sink afterwards for NMR/balance. He c/o R UE pain. Applied muscle rub to sore areas and we completed AAROM and stretches to address this (involving shoulder, elbow, wrist, and digits). He required manual facilitation of Rt and use of mirror for visual feedback. Min vcs for joint protection strategies during self ROM. At end of session, pt stood with Mod A to dance with girlfriend, Shirlean Mylar, to one of their favorite songs. He was then left with all needs, safety belt fastened, and family present. R UE positioned with elbow comfortably extended on half lap tray.    Therapy Documentation Precautions:  Precautions Precautions: Fall Precaution Comments: bone flap in L abdomen, no L side lying Restrictions Weight Bearing Restrictions: No Pain: Completed AAROM  exercises/stretches to address. Pt nodded when asked if pain had decreased compared to start of session    ADL:      See Function Navigator for Current Functional Status.   Therapy/Group: Individual Therapy  Norvil Martensen A Kalum Minner 03/18/2018, 12:39 PM

## 2018-03-18 NOTE — Progress Notes (Signed)
Speech Language Pathology Daily Session Note  Patient Details  Name: Xavier Villegas MRN: [id number] Date of Birth: Aug 28, 1980  Today's Date: 03/18/2018   Skilled treatment session #1 SLP Individual Time: 5397-6734 SLP Individual Time Calculation (min): 45 min   Skilled treatment session #2 SLP Individual Time: 1100-1145 SLP Individual Time Calculation (min): 45 min  Short Term Goals: Week 3: SLP Short Term Goal 1 (Week 3): Pt will demonstrate selective attention in mildly distracting environment for 45 minutes with supervision cues.  SLP Short Term Goal 2 (Week 3): Pt will complete mildly complex problem solving tasks with Min A cues.  SLP Short Term Goal 3 (Week 3): Pt will self-monitor and self correct functional errors with supervision A verbal cues in problem solving tasks. SLP Short Term Goal 4 (Week 3): Pt will utilize multimodal communication to express wants/needs with Min A cues in 9 out of 10 opportunities.  SLP Short Term Goal 5 (Week 3): Pt will locate items to the right of midline during basic, familar tasks with Min A cues.  SLP Short Term Goal 6 (Week 3): Pt will produce vocalizations in 5 out of 10 opportunities with Mod A cues.   Skilled Therapeutic Interventions:  Skilled treatment session #1 focused on communication goals. SLP received pt in bd with father present. Pt used gestures effectively to communicate need to use bathroom, yes/no for use of urinal, gestures for shorts/shoes/ yes/no for selection of athletic shorts. Pt required Max A to Mod A for right attention and almost ran into telesitter, WOW and wall as he was propelling himself to dayroom. When going back to his room, he ran into nurse and her [place] before cues could be given. SLP further facilitated session by providing Mod A cues for [place] with intermittent ability to fade melody but continued to keep printed phrase. Pt with ability to monitor verbal perseveration but not self-correct. Pt able to produce  phrase "how are you" with printed words only and phonemic cue to start phrase x 5 with multiple staff as they entered dayroom. Pt's cognitive function was also targeted during very modified game of Blink (noevl game to pt). Pt required Mod A faded to Min A for right attention, Max A faded to Mod A to compare and contrast cards via yes/no. Pt able to demonstrate selective attention in mildly distracting environment for ~ 45 minutes. Pt was returned ot room and cued to ask father "how are you?" Pt with ~ 75% ability to produce phrase with phonemic cue.      Skilled treatment session #2 focused on communication goals and education with pt's girlfriend. SLP facilitated session by providing heavy verbal cues to produce phrase "how are you?" Pt stated without melody and with appropriate voicing. Pt and girlfriend amazed and girlfriend began crying at hearing pt's normal voice." Education provided and written down for pt's girlfriend on expressive aphasia and apraxia of speech with examples provided during actual moment of therapy. Pt with great progress during session.   Function:   Cognition Comprehension Comprehension assist level: Follows basic conversation/direction with extra time/assistive device  Expression   Expression assist level: Expresses basic 25 - 49% of the time/requires cueing 50 - 75% of the time. Uses single words/gestures.  Social Interaction Social Interaction assist level: Interacts appropriately 90% of the time - Needs monitoring or encouragement for participation or interaction.  Problem Solving Problem solving assist level: Solves basic 90% of the time/requires cueing < 10% of the time;Solves basic problems with no  assist  Memory Memory assist level: More than reasonable amount of time    Pain    Therapy/Group: Individual Therapy  [name] 03/18/2018, 8:22 AM

## 2018-03-19 ENCOUNTER — Inpatient Hospital Stay (HOSPITAL_COMMUNITY): Payer: BLUE CROSS/BLUE SHIELD | Admitting: Occupational Therapy

## 2018-03-19 DIAGNOSIS — G8191 Hemiplegia, unspecified affecting right dominant side: Secondary | ICD-10-CM

## 2018-03-19 NOTE — Progress Notes (Signed)
Occupational Therapy Session Note  Patient Details  Name: Xavier Villegas MRN: [id number] Date of Birth: 09-14-1980  Today's Date: 03/19/2018 OT Group Time: 1100-1200 OT Group Time Calculation (min): 60 min   Skilled Therapeutic Interventions/Progress Updates: Pt participated in therapeutic w/c level dance group with focus on UE/LE strengthening, activity tolerance, and social participation for carryover during self care tasks. Pt was guided through various dance-based exercises involving UB/LB and trunk. Emphasis placed on AAROM for R UE/LE. Provided pt with visual instruction and HOH for technique and joint protection. Pt hummed along to familiar [employer]. At end of session he was taken back to room and left with RN.      Therapy Documentation Precautions:  Precautions Precautions: Fall Precaution Comments: bone flap in L abdomen, no L side lying Restrictions Weight Bearing Restrictions: No :   Pain: Pt shook head when asked if he had pain    ADL:      See Function Navigator for Current Functional Status.   Therapy/Group: Group Therapy  Ovetta Bazzano A Eliakim Tendler 03/19/2018, 12:52 PM

## 2018-03-19 NOTE — Progress Notes (Signed)
Xavier Villegas is a 37 y.o. male 1980-09-28 [id number]  Subjective: Indicates uncomfortable sensation in right leg and right arm but denies pain.  Denies any problem with breathing, or headache.  Indicates no problem with sleep.  Objective: Vital signs in last 24 hours: Temp:  [97.7 F (36.5 C)-97.9 F (36.6 C)] 97.7 F (36.5 C) (08/10 0515) Pulse Rate:  [61-71] 61 (08/10 0515) Resp:  [16-17] 16 (08/10 0515) BP: (107-116)/(70-79) 107/70 (08/10 0515) SpO2:  [99 %-100 %] 100 % (08/10 0515) Weight change:  Last BM Date: 03/17/18  Intake/Output from previous day: 08/09 0701 - 08/10 0700 In: 622 [P.O.:622] Out: 1002 [Urine:1002]  Physical Exam General: No apparent distress   Sitting in WC. Family in room. Lungs: Normal effort. Lungs clear to auscultation, no crackles or wheezes. Cardiovascular: Regular rate and rhythm, no edema Neurological: Aphasic. R HP. No new neurological deficits   Lab Results: BMET    Component Value Date/Time   NA 139 03/11/2018 0612   K 3.6 03/11/2018 0612   CL 99 03/11/2018 0612   CO2 31 03/11/2018 0612   GLUCOSE 93 03/11/2018 0612   BUN 11 03/11/2018 0612   CREATININE 0.59 (L) 03/11/2018 0612   CALCIUM 9.3 03/11/2018 0612   GFRNONAA >60 03/11/2018 0612   GFRAA >60 03/11/2018 0612   CBC    Component Value Date/Time   WBC 10.5 03/11/2018 0612   RBC 4.10 (L) 03/11/2018 0612   HGB 14.7 03/11/2018 0612   HCT 43.0 03/11/2018 0612   PLT 581 (H) 03/11/2018 0612   MCV 104.9 (H) 03/11/2018 0612   MCH 35.9 (H) 03/11/2018 0612   MCHC 34.2 03/11/2018 0612   RDW 12.5 03/11/2018 0612   LYMPHSABS 1.8 03/02/2018 0740   MONOABS 1.4 (H) 03/02/2018 0740   EOSABS 0.2 03/02/2018 0740   BASOSABS 0.1 03/02/2018 0740   CBG's (last 3):  No results for input(s): GLUCAP in the last 72 hours. LFT's Lab Results  Component Value Date   ALT 116 (H) 03/02/2018   AST 57 (H) 03/02/2018   ALKPHOS 125 03/02/2018   BILITOT 0.9 03/02/2018     Studies/Results: No results found.  Medications:  I have reviewed the patient's current medications. Scheduled Medications: . aspirin  325 mg Oral Daily  . baclofen  10 mg Oral TID  . folic acid  4 mg Oral Daily  . HYDROcodone-acetaminophen  1 tablet Oral BID WC  . mouth rinse  15 mL Mouth Rinse BID  . MUSCLE RUB   Topical BID  . pantoprazole  40 mg Oral Daily  . protein supplement shake  11 oz Oral TID WC  . vitamin B-6  50 mg Oral Daily  . senna-docusate  2 tablet Oral Daily  . vitamin B-12  1,000 mcg Oral Daily   PRN Medications: acetaminophen, alum & mag hydroxide-simeth, bisacodyl, diphenhydrAMINE, guaiFENesin-dextromethorphan, prochlorperazine **OR** prochlorperazine **OR** prochlorperazine, RESOURCE THICKENUP CLEAR, senna-docusate, sodium phosphate, traZODone  Assessment/Plan: Active Problems:   Right hemiparesis (HCC)   Global aphasia   Acute ischemic right MCA stroke (HCC)   Hyponatremia   Length of stay, days: 18  1.  Left MCA infarct with hemorrhagic transformation status post craniectomy.  Subsequent right hemiparesis, aphasia and dysphagia.  Continue medical management of comorbidities and inpatient comprehensive rehab as prescribed. 2.  Leukocytosis/thrombocytosis, reactive?  Afebrile and nontoxic.  Follow-up CBC next week as ordered. 3.  Neurogenic bladder.  Remains incontinent but retention is improving ongoing bladder training with environmental support. 4.  Hyponatremia, resolved.  Mallery Harshman A. Asa Lente, MD 03/19/2018, 1:49 PM

## 2018-03-20 ENCOUNTER — Inpatient Hospital Stay (HOSPITAL_COMMUNITY): Payer: BLUE CROSS/BLUE SHIELD | Admitting: Physical Therapy

## 2018-03-20 NOTE — Progress Notes (Signed)
Xavier Villegas is a 37 y.o. male 24-Mar-1981 [id number]  Subjective: Alone in room at my visit. Indicates he feels well, no pain or concerns (via head shaking/nodding to questions)  Objective: Vital signs in last 24 hours: Temp:  [97.7 F (36.5 C)-97.8 F (36.6 C)] 97.7 F (36.5 C) (08/11 0436) Pulse Rate:  [67-89] 67 (08/11 0436) Resp:  [18-19] 18 (08/11 0436) BP: (117-141)/(74-95) 117/74 (08/11 0436) SpO2:  [91 %-99 %] 99 % (08/11 0436) Weight change:  Last BM Date: 03/19/18  Intake/Output from previous day: 08/10 0701 - 08/11 0700 In: 240 [P.O.:240] Out: 925 [Urine:925]  Physical Exam General: No apparent distress   Aphasic Lungs: Normal effort. Lungs clear to auscultation, no crackles or wheezes. Cardiovascular: Regular rate and rhythm, no edema Neurological: No new neurological deficits  Lab Results: BMET    Component Value Date/Time   NA 139 03/11/2018 0612   K 3.6 03/11/2018 0612   CL 99 03/11/2018 0612   CO2 31 03/11/2018 0612   GLUCOSE 93 03/11/2018 0612   BUN 11 03/11/2018 0612   CREATININE 0.59 (L) 03/11/2018 0612   CALCIUM 9.3 03/11/2018 0612   GFRNONAA >60 03/11/2018 0612   GFRAA >60 03/11/2018 0612   CBC    Component Value Date/Time   WBC 10.5 03/11/2018 0612   RBC 4.10 (L) 03/11/2018 0612   HGB 14.7 03/11/2018 0612   HCT 43.0 03/11/2018 0612   PLT 581 (H) 03/11/2018 0612   MCV 104.9 (H) 03/11/2018 0612   MCH 35.9 (H) 03/11/2018 0612   MCHC 34.2 03/11/2018 0612   RDW 12.5 03/11/2018 0612   LYMPHSABS 1.8 03/02/2018 0740   MONOABS 1.4 (H) 03/02/2018 0740   EOSABS 0.2 03/02/2018 0740   BASOSABS 0.1 03/02/2018 0740   CBG's (last 3):  No results for input(s): GLUCAP in the last 72 hours. LFT's Lab Results  Component Value Date   ALT 116 (H) 03/02/2018   AST 57 (H) 03/02/2018   ALKPHOS 125 03/02/2018   BILITOT 0.9 03/02/2018    Studies/Results: No results found.  Medications:  I have reviewed the patient's current  medications. Scheduled Medications: . aspirin  325 mg Oral Daily  . baclofen  10 mg Oral TID  . folic acid  4 mg Oral Daily  . HYDROcodone-acetaminophen  1 tablet Oral BID WC  . mouth rinse  15 mL Mouth Rinse BID  . MUSCLE RUB   Topical BID  . pantoprazole  40 mg Oral Daily  . protein supplement shake  11 oz Oral TID WC  . vitamin B-6  50 mg Oral Daily  . senna-docusate  2 tablet Oral Daily  . vitamin B-12  1,000 mcg Oral Daily   PRN Medications: acetaminophen, alum & mag hydroxide-simeth, bisacodyl, diphenhydrAMINE, guaiFENesin-dextromethorphan, prochlorperazine **OR** prochlorperazine **OR** prochlorperazine, RESOURCE THICKENUP CLEAR, senna-docusate, sodium phosphate, traZODone  Assessment/Plan: Active Problems:   Right hemiparesis (HCC)   Global aphasia   Acute ischemic right MCA stroke (HCC)   Hyponatremia   Length of stay, days: 19  1.  Left MCA infarct with hemorrhagic transformation status post craniectomy.  Subsequent right hemiparesis, aphasia and dysphagia.  Continue medical management of comorbidities and inpatient comprehensive rehab as prescribed. 2.  Leukocytosis/thrombocytosis, reactive?  Afebrile and nontoxic.  Follow-up CBC next week as ordered. 3.  Neurogenic bladder.  Remains incontinent but retention is improving ongoing bladder training with environmental support. 4.  Hyponatremia, resolved.  Valerie A. Asa Lente, MD 03/20/2018, 9:35 AM

## 2018-03-20 NOTE — Progress Notes (Addendum)
Physical Therapy Session Note  Patient Details  Name: Xavier Villegas MRN: [id number] Date of Birth: [birth year]  Today's Date: 03/20/2018 PT Individual Time: 1500-1530 PT Individual Time Calculation (min): 30 min  and Today's Date: 03/20/2018 PT Missed Time: 15 Minutes Missed Time Reason: Nursing care  Short Term Goals: Week 3:  PT Short Term Goal 1 (Week 3): Pt will ambulate 51' with LRAD and min assist PT Short Term Goal 2 (Week 3): Pt will negotiate 4 steps with LHR and max assist PT Short Term Goal 3 (Week 3): Pt will propel w/c in community environment with min assist and mod verbal cues for R attention   Skilled Therapeutic Interventions/Progress Updates:   Pt in w/c and agreeable to therapy, denies pain. Missed 15 min of skilled PT 2/2 nursing care. Pt self-propelled w/c to/from therapy gym to work on independence w/ locomotion, supervision using L hemi technique. Worked on gait training and RLE NMR this session. Ambulated 40' w/ hemiwalker and min-mod assist for upright balance and lateral weight shifting. Intermittent assist for RLE placement, even w/ shoe cover for improved foot clearance. No assist needed for R quad activation in stance. Educated pt on benefits of using AFO for improved foot clearance and DF assist, however made aware that primary therapist may wish to not introduce orthotics until later in rehab stay, pt agreeable. Trial-ed R posterior leaf spring AFO for 40' back to w/c, min-mod assist as well. Pt continued to need some assist for R step placement, more so than first gait bout (suspect 2/2 fatigue), but improved R foot clearance. Pt unable to move R ankle into DF against gravity prior to [facility] use. However when transitioning to ball kicking activity in stance for R hip activation, noticeable DF when kicking ball. Pt able to elicit contraction on command again in seated w/ ankle support. Performed 20-30 ball kicking reps in total w/ increasing force of kick w/  repetition. Returned to room and ended session in w/c, in care of family and all needs met.   Therapy Documentation Precautions:  Precautions Precautions: Fall Precaution Comments: bone flap in L abdomen, no L side lying Restrictions Weight Bearing Restrictions: No General: PT Amount of Missed Time (min): 15 Minutes PT Missed Treatment Reason: Nursing care Vital Signs: Therapy Vitals Temp: 97.7 F (36.5 C) Temp Source: Oral Pulse Rate: 76 Resp: 18 BP: 125/85 Patient Position (if appropriate): Sitting Oxygen Therapy SpO2: 98 % O2 Device: Room Air  See Function Navigator for Current Functional Status.   Therapy/Group: Individual Therapy  Fidelia Cathers K Arnette 03/20/2018, 4:58 PM

## 2018-03-20 NOTE — Plan of Care (Signed)
  Problem: RH BLADDER ELIMINATION Goal: RH STG MANAGE BLADDER WITH ASSISTANCE Description STG Manage Bladder With mod I Assistance   Outcome: Not Progressing; incontinent

## 2018-03-21 ENCOUNTER — Inpatient Hospital Stay (HOSPITAL_COMMUNITY): Payer: BLUE CROSS/BLUE SHIELD | Admitting: Occupational Therapy

## 2018-03-21 ENCOUNTER — Inpatient Hospital Stay (HOSPITAL_COMMUNITY): Payer: BLUE CROSS/BLUE SHIELD | Admitting: Physical Therapy

## 2018-03-21 ENCOUNTER — Inpatient Hospital Stay (HOSPITAL_COMMUNITY): Payer: BLUE CROSS/BLUE SHIELD

## 2018-03-21 LAB — BASIC METABOLIC PANEL
Anion gap: 8 (ref 5–15)
BUN: 19 mg/dL (ref 6–20)
CHLORIDE: 104 mmol/L (ref 98–111)
CO2: 27 mmol/L (ref 22–32)
Calcium: 9.2 mg/dL (ref 8.9–10.3)
Creatinine, Ser: 0.66 mg/dL (ref 0.61–1.24)
GFR calc Af Amer: >60 mL/min (ref >60)
GFR calc non Af Amer: >60 mL/min (ref >60)
Glucose, Bld: 97 mg/dL (ref 70–99)
POTASSIUM: 3.8 mmol/L (ref 3.5–5.1)
Sodium: 139 mmol/L (ref 135–145)

## 2018-03-21 LAB — CBC
HEMATOCRIT: 40.6 % (ref 39.0–52.0)
HEMOGLOBIN: 14.4 g/dL (ref 13.0–17.0)
MCH: 36.5 pg — ABNORMAL HIGH (ref 26.0–34.0)
MCHC: 35.5 g/dL (ref 30.0–36.0)
MCV: 102.8 fL — ABNORMAL HIGH (ref 78.0–100.0)
Platelets: 240 10*3/uL (ref 150–400)
RBC: 3.95 MIL/uL — AB (ref 4.22–5.81)
RDW: 12.6 % (ref 11.5–15.5)
WBC: 9 10*3/uL (ref 4.0–10.5)

## 2018-03-21 MED ORDER — BACLOFEN 10 MG PO TABS
10.0000 mg | ORAL_TABLET | Freq: Four times a day (QID) | ORAL | Status: DC
  Administered 2018-03-21 – 2018-04-06 (×64): 10 mg via ORAL
  Filled 2018-03-21 (×65): qty 1

## 2018-03-21 NOTE — Progress Notes (Signed)
Physical Therapy Session Note  Patient Details  Name: Xavier Villegas MRN: [id number] Date of Birth: 1981-05-18  Today's Date: 03/21/2018 PT Individual Time: 1000-1100 PT Individual Time Calculation (min): 60 min   Short Term Goals: Week 3:  PT Short Term Goal 1 (Week 3): Pt will ambulate 8' with LRAD and min assist PT Short Term Goal 2 (Week 3): Pt will negotiate 4 steps with LHR and max assist PT Short Term Goal 3 (Week 3): Pt will propel w/c in community environment with min assist and mod verbal cues for R attention   Skilled Therapeutic Interventions/Progress Updates:    Pt gestures to no pain prior to session onset. Pt propels w/c from room> gym for 75 feet using L hemi technique and supervision for VC's for right visual scanning to avoid obstacles. x1 trial of gait for 25 feet using HW with therapist providing mod A for weight shifting with occasional min A for R stepping during fatigue onset. Pt requires VC's to maintain proper step length. Pt performs 2nd trial for 41 feet using HW and R GRAFO with lateral strut to facilitate dorsiflexion & stability during heel strike and prevent excess supination from causing ligamentous injury. Therapist provided same level of assist for both trials. Pt performs stand pivot transfers from w/c<>treatment mat with CGA consistently. Pt performs multiple trials of kicking a ball with both LE's until fatigue using HW for support and therapist providing min A for static standing and mod A to facilitate weight shifting during kicking stationary ball. Pt further progressed to sequencing and timing kicking for a ball in motion with mod A for wt shifting correctly. Pt then kicked a rolling ball with L LE to knock down cones used as external feedback for proper force. Multiple rest breaks throughout session due to fatigue onset. PT propelled Pt back to room for time management and was returned sitting in chair with chair alarm activated, call bell in reach  and all needs met.   Therapy Documentation Precautions:  Precautions Precautions: Fall Precaution Comments: bone flap in L abdomen, no L side lying Restrictions Weight Bearing Restrictions: No   See Function Navigator for Current Functional Status.   Therapy/Group: Individual Therapy  Floreen Comber 03/21/2018, 12:24 PM

## 2018-03-21 NOTE — Progress Notes (Signed)
Orthopedic Tech Progress Note Patient Details:  Xavier Villegas [birth year] [id number]  Patient ID: Xavier Villegas, male   DOB: [birth year], 37 y.o.   MRN: [id number]   Xavier Villegas 03/21/2018, 2:06 PM Called in [employer]anger brace order; spoke with [facility]

## 2018-03-21 NOTE — Progress Notes (Signed)
Speech Language Pathology Daily Session Note  Patient Details  Name: Xavier Villegas MRN: [id number] Date of Birth: Dec 06, 1980  Today's Date: 03/21/2018 SLP Individual Time: 5093-2671 SLP Individual Time Calculation (min): 41 min  Short Term Goals: Week 3: SLP Short Term Goal 1 (Week 3): Pt will demonstrate selective attention in mildly distracting environment for 45 minutes with supervision cues.  SLP Short Term Goal 2 (Week 3): Pt will complete mildly complex problem solving tasks with Min A cues.  SLP Short Term Goal 3 (Week 3): Pt will self-monitor and self correct functional errors with supervision A verbal cues in problem solving tasks. SLP Short Term Goal 4 (Week 3): Pt will utilize multimodal communication to express wants/needs with Min A cues in 9 out of 10 opportunities.  SLP Short Term Goal 5 (Week 3): Pt will locate items to the right of midline during basic, familar tasks with Min A cues.  SLP Short Term Goal 6 (Week 3): Pt will produce vocalizations in 5 out of 10 opportunities with Mod A cues.   Skilled Therapeutic Interventions:Skilled ST services focused on speech skills. Pt's girlfriend was present for session. SLP facilitated expressive speech utilizing MIT of common phrases, pt required initial max A verbal cues singing in unison fading to written cue only on "how are you"  With relatively normal intonation. Pt vocalized "oo", "aye" and "ah" vowels with max A verbal/written cues fading to mod A cues in phrase completion task. Pt vocalized "I" , "o' , "u"  in MIT task with max A verbal/written cues fading to mod A. Pt vocalized initial constant sounds "w" and "b" with max A verbal cues in phrase completion task, as well as "bi" in spontaneous speech in response to "bye" when SLP was leaving the room. SLP facilitated communication utilizing letter board, pt demonstrated ability to spell mom's name (nancy) with initial verbal/written cues (visual aid) fading to only Mod A  verbal cues with written feedback of letters selected.  Pt communicated wants/needs during session with gestures and in response to yes/no questions with min A verbal cues. Pt was left in room with call bell within reach and chair alarm set. Recommend to continue skilled ST services.    Function:  Eating Eating                 Cognition Comprehension Comprehension assist level: Follows basic conversation/direction with no assist  Expression Expression assistive device: Communication board Expression assist level: Expresses basic 50 - 74% of the time/requires cueing 25 - 49% of the time. Needs to repeat parts of sentences.;Expresses basic 25 - 49% of the time/requires cueing 50 - 75% of the time. Uses single words/gestures.  Social Interaction Social Interaction assist level: Interacts appropriately 90% of the time - Needs monitoring or encouragement for participation or interaction.  Problem Solving Problem solving assist level: Solves basic 90% of the time/requires cueing < 10% of the time;Solves basic 75 - 89% of the time/requires cueing 10 - 24% of the time  Memory Memory assist level: Recognizes or recalls 90% of the time/requires cueing < 10% of the time    Pain Pain Assessment Pain Score: 0-No pain  Therapy/Group: Individual Therapy  Kerin Kren  [facility] 03/21/2018, 4:11 PM

## 2018-03-21 NOTE — Plan of Care (Signed)
  Problem: Consults Goal: RH STROKE PATIENT EDUCATION Description See Patient Education module for education specifics  Outcome: Progressing   Problem: RH BOWEL ELIMINATION Goal: RH STG MANAGE BOWEL WITH ASSISTANCE Description STG Manage Bowel with mod I Assistance.   Outcome: Progressing Flowsheets (Taken 03/21/2018 1432) STG: Pt will manage bowels with assistance: 4-Minimum assistance Goal: RH STG MANAGE BOWEL W/MEDICATION W/ASSISTANCE Description STG Manage Bowel with Medication with mod I Assistance.   Outcome: Progressing Flowsheets (Taken 03/21/2018 1432) STG: Pt will manage bowels with medication with assistance: 4-Minimal assistance   Problem: RH BLADDER ELIMINATION Goal: RH STG MANAGE BLADDER WITH ASSISTANCE Description STG Manage Bladder With mod I Assistance   Outcome: Progressing Flowsheets (Taken 03/21/2018 1432) STG: Pt will manage bladder with assistance: 4-Minimal assistance   Problem: RH SKIN INTEGRITY Goal: RH STG ABLE TO PERFORM INCISION/WOUND CARE W/ASSISTANCE Description STG Able To Perform Incision/Wound Care With mod I  Assistance.  Outcome: Progressing Flowsheets (Taken 03/21/2018 1432) STG: Pt will be able to perform incision/wound care with assistance: 4-Minimal assistance   Problem: RH SAFETY Goal: RH STG ADHERE TO SAFETY PRECAUTIONS W/ASSISTANCE/DEVICE Description STG Adhere to Safety Precautions With mod I Assistance/Device. Telesitter use as needed  Outcome: Progressing Flowsheets (Taken 03/21/2018 1432) STG:Pt will adhere to safety precautions with assistance/device: 3-Moderate assistance   Problem: RH COGNITION-NURSING Goal: RH STG USES MEMORY AIDS/STRATEGIES W/ASSIST TO PROBLEM SOLVE Description STG Uses Memory Aids/Strategies With cues/supervision Assistance to Problem Solve.  Outcome: Progressing Flowsheets (Taken 03/21/2018 1432) STG: Uses memory aids/strategies with assistance: 4-Minimal assistance   Problem: RH KNOWLEDGE  DEFICIT Goal: RH STG INCREASE KNOWLEDGE OF DYSPHAGIA/FLUID INTAKE Description With cues/resources/supervision  Outcome: Progressing

## 2018-03-21 NOTE — Progress Notes (Signed)
Subjective/Complaints: No new complaints. Had a pretty good night.   ROS: limited due to language/communication    Objective: Vital Signs: Blood pressure 103/67, pulse 66, temperature 97.7 F (36.5 C), temperature source Oral, resp. rate 18, height 6' (1.829 m), weight 83.2 kg, SpO2 98 %. No results found. Results for orders placed or performed during the hospital encounter of 03/01/18 (from the past 72 hour(s))  CBC     Status: Abnormal   Collection Time: 03/21/18  6:03 AM  Result Value Ref Range   WBC 9.0 4.0 - 10.5 K/uL   RBC 3.95 (L) 4.22 - 5.81 MIL/uL   Hemoglobin 14.4 13.0 - 17.0 g/dL   HCT 40.6 39.0 - 52.0 %   MCV 102.8 (H) 78.0 - 100.0 fL   MCH 36.5 (H) 26.0 - 34.0 pg   MCHC 35.5 30.0 - 36.0 g/dL   RDW 12.6 11.5 - 15.5 %   Platelets 240 150 - 400 K/uL    Comment: Performed at [facility] [address], [place], [place] [address]  Basic metabolic panel     Status: None   Collection Time: 03/21/18  6:03 AM  Result Value Ref Range   Sodium 139 135 - 145 mmol/L   Potassium 3.8 3.5 - 5.1 mmol/L   Chloride 104 98 - 111 mmol/L   CO2 27 22 - 32 mmol/L   Glucose, Bld 97 70 - 99 mg/dL   BUN 19 6 - 20 mg/dL   Creatinine, Ser 0.66 0.61 - 1.24 mg/dL   Calcium 9.2 8.9 - 10.3 mg/dL   GFR calc non Af Amer >60 >60 mL/min   GFR calc Af Amer >60 >60 mL/min    Comment: (NOTE) The eGFR has been calculated using the CKD EPI equation. This calculation has not been validated in all clinical situations. eGFR's persistently <60 mL/min signify possible Chronic Kidney Disease.    Anion gap 8 5 - 15    Comment: Performed at [facility] [address] [place], [place] [address]     Constitutional: No distress . Vital signs reviewed. HEENT: EOMI, oral membranes moist Neck: supple Cardiovascular: RRR without murmur. No JVD    Respiratory: CTA Bilaterally without wheezes or rales. Normal effort    GI: BS +, non-tender, non-distended  Neuro:  Expressive language  deficits. Improving yes/no, initiates occasional word/phrase Motor: lUE/LLE: 5/5 proximal distal RUE/RLE; 0/5 proximal distal---has trace to 1+ prox right HF/HE  MAS: Left elbow and finger flexors 1-2/4,, pecs 2/4.  right ankle and plantar flexors tr to 1/4, toes up, DTR's remain 3+ Right Musc/Skel:  No edema or tenderness in extremities Psych: pleasant and cooperative Skin: wounds cdi   Assessment/Plan: 1. Functional deficits secondary to left MCA infarct with hemorrhagic transformation status post craniectomy which require 3+ hours per day of interdisciplinary therapy in a comprehensive inpatient rehab setting. Physiatrist is providing close team supervision and 24 hour management of active medical problems listed below. Physiatrist and rehab team continue to assess barriers to discharge/monitor patient progress toward functional and medical goals. FIM: Function - Bathing Bathing activity did not occur: Refused Position: Shower Body parts bathed by patient: Right arm, Chest, Abdomen, Front perineal area, Right upper leg, Left upper leg, Left lower leg, Right lower leg Body parts bathed by helper: Left arm, Back, Buttocks Assist Level: Touching or steadying assistance(Pt > 75%)  Function- Upper Body Dressing/Undressing What is the patient wearing?: Pull over shirt/dress Pull over shirt/dress - Perfomed by patient: Thread/unthread left  sleeve, Put head through opening, Pull shirt over trunk, Thread/unthread right sleeve Pull over shirt/dress - Perfomed by helper: Thread/unthread right sleeve Assist Level: Supervision or verbal cues, Set up Set up : To obtain clothing/put away Function - Lower Body Dressing/Undressing What is the patient wearing?: Pants, Socks, Shoes Position: Wheelchair/chair at sink Pants- Performed by patient: Thread/unthread left pants leg, Pull pants up/down, Thread/unthread right pants leg Pants- Performed by helper: Thread/unthread left pants leg, Thread/unthread  right pants leg, Pull pants up/down Non-skid slipper socks- Performed by patient: Don/doff right sock, Don/doff left sock Non-skid slipper socks- Performed by helper: Don/doff right sock, Don/doff left sock Socks - Performed by patient: Don/doff right sock, Don/doff left sock Socks - Performed by helper: Don/doff right sock, Don/doff left sock Shoes - Performed by patient: Don/doff right shoe, Don/doff left shoe Shoes - Performed by helper: Don/doff right shoe, Don/doff left shoe, Fasten left, Fasten right AFO - Performed by helper: Don/doff right AFO Assist for footwear: Maximal assist Assist for lower body dressing: Touching or steadying assistance (Pt > 75%)  Function - Toileting Toileting activity did not occur: No continent bowel/bladder event Toileting steps completed by patient: Adjust clothing after toileting, Adjust clothing prior to toileting Toileting steps completed by helper: Adjust clothing prior to toileting Toileting Assistive Devices: Grab bar or rail Assist level: Two helpers  Function - Air cabin crew transfer activity did not occur: Safety/medical concerns Toilet transfer assistive device: Elevated toilet seat/BSC over toilet Mechanical lift: Stedy Assist level to toilet: Maximal assist (Pt 25 - 49%/lift and lower) Assist level from toilet: Maximal assist (Pt 25 - 49%/lift and lower)  Function - Chair/bed transfer Chair/bed transfer activity did not occur: Safety/medical concerns Chair/bed transfer method: Stand pivot Chair/bed transfer assist level: Moderate assist (Pt 50 - 74%/lift or lower) Chair/bed transfer assistive device: Armrests Chair/bed transfer details: Verbal cues for precautions/safety, Verbal cues for safe use of DME/AE, Manual facilitation for placement  Function - Locomotion: Wheelchair Will patient use wheelchair at discharge?: Yes(tbd) Type: Manual Max wheelchair distance: 150' Assist Level: Supervision or verbal cues Assist Level:  Supervision or verbal cues Assist Level: Supervision or verbal cues Turns around,maneuvers to table,bed, and toilet,negotiates 3% grade,maneuvers on rugs and over doorsills: No Function - Locomotion: Ambulation Ambulation activity did not occur: Safety/medical concerns Assistive device: Other (comment)(hemiwalker) Max distance: 73' Assist level: Moderate assist (Pt 50 - 74%) Assist level: Moderate assist (Pt 50 - 74%) Walk 50 feet with 2 turns activity did not occur: Safety/medical concerns Assist level: Moderate assist (Pt 50 - 74%) Walk 150 feet activity did not occur: Safety/medical concerns Walk 10 feet on uneven surfaces activity did not occur: Safety/medical concerns  Function - Comprehension Comprehension: Auditory Comprehension assist level: Understands complex 90% of the time/cues 10% of the time  Function - Expression Expression: Verbal Expression assistive device: Communication board Expression assist level: Expresses complex 90% of the time/cues < 10% of the time  Function - Social Interaction Social Interaction assist level: Interacts appropriately 90% of the time - Needs monitoring or encouragement for participation or interaction.  Function - Problem Solving Problem solving assist level: Solves basic 90% of the time/requires cueing < 10% of the time, Solves basic 75 - 89% of the time/requires cueing 10 - 24% of the time  Function - Memory Memory assist level: Recognizes or recalls 90% of the time/requires cueing < 10% of the time Patient normally able to recall (first 3 days only): Current season, Location of own room, Staff names and  faces, That he or she is in a hospital   Medical Problem List and Plan: 1.  Right hemiparesis, dysphagia, and aphasia secondary to large left MCA infarct with hemorrhagic transformation s/p hemicraniectomy.  Continue CIR  Continue Helmet for safety OOB    2.  DVT Prophylaxis/Anticoagulation: Mechanical: Sequential compression  devices, below knee Bilateral lower extremities 3. Pain Management: Tylenol as needed  -baclofen 9m TID--titrate to 151mqid  -splint/ROM 4. Mood: Consult LCSW  for evaluation and support 5. Neuropsych: This patient is not fully capable of making decisions on his own behalf. 6. Skin/Wound Care: Routine pressure relief measures.  Monitor wound for healing.  Maintain adequate nutritional status. 7. Fluids/Electrolytes/Nutrition: Monitor I's/O's.   Encourage nectar thick fluid intake.     8. Dysphagia: Continue dysphagia 3, nectar liquids--    -pushing fluids 9. Leucocytosis: ?Progressive and felt to be reactive--ID recommends d/c antibiotic and monitor for signs of infection.   Mother with history of CLL.  WBCs 10.5 8/2--> 9.0 8/12 10. High homocysteine levels/low folate levels/boderline low B12: On high dose folic supplement, B6 and received B12 inj 7/14  . MRHFR--heterozygous /single mutation ID.  May cause abnormal clotting 11. GERD: Continue PPI.  12. Hyponatremia  Sodium 139 on 8/12 13. Neurogenic bladder:  -incontinent but retention improved  -continue bladder training, up to toilet, encouraged him to reach out to RN's when he needs to empty bladder     LOS (Days) 20 A FACE TO FACE EVALUATION WAS PERFORMED  ZaMeredith Staggers/07/2018, 11:39 AM

## 2018-03-21 NOTE — Progress Notes (Signed)
Occupational Therapy Weekly Progress Note  Patient Details  Name: Xavier Villegas MRN: [id number] Date of Birth: February 02, 1981  Beginning of progress report period: 03/11/18 End of progress report period: 03/21/18  Today's Date: 03/21/2018 OT Individual Time: 1119-1200 and 1510-1400 OT Individual Time Calculation (min): 41 min and 50 min  Patient has met 0 of 3 short term goals.    Pt has made slow yet functional progress at time of report. At present, he requires Mod-Max A for bathroom transfers and Mod A for dynamic balance during BADLs. During last report period, he required Max A and Stedy for bathroom transfers. He also required Max A for dynamic balance during self care. Pt consistently exhibits high levels of participation and appears motivated to improve functional status. Family is often present and involved during tx sessions. Rt NMR, trunk control, and safety awareness continue to be main areas of focus during OT sessions to further increase functional independence. Continue POC.   Patient continues to demonstrate the following deficits: muscle weakness, decreased cardiorespiratoy endurance, impaired timing and sequencing, abnormal tone, unbalanced muscle activation and decreased coordination, decreased midline orientation, decreased attention to right, right side neglect and decreased motor planning, decreased awareness, decreased problem solving, decreased safety awareness and decreased memory and decreased sitting balance, decreased standing balance, decreased postural control and hemiplegia and therefore will continue to benefit from skilled OT intervention to enhance overall performance with BADL.  Patient progressing toward long term goals..  Continue plan of care.  OT Short Term Goals Week 2:  OT Short Term Goal 1 (Week 2): Pt will complete toilet transfers with Mod A OT Short Term Goal 1 - Progress (Week 2): Progressing toward goal OT Short Term Goal 2 (Week 2): Pt will  exhibit improved trunk control by completing 1/3 components of donning pants while sitting unsupported  OT Short Term Goal 2 - Progress (Week 2): Progressing toward goal OT Short Term Goal 3 (Week 2): Pt will complete 4/4 components of donning shirt with close supervision for balance  OT Short Term Goal 3 - Progress (Week 2): Progressing toward goal Week 3:  OT Short Term Goal 1 (Week 3): Pt will initiate UB hemi dressing strategies during 1 ADL session  OT Short Term Goal 2 (Week 3): Pt will complete 4/4 components of donning shirt with close supervision for balance  OT Short Term Goal 3 (Week 3): Pt will complete 1/3 components of donning pants while sitting unsupported with close supervision   OT Short Term Goal 4 (Week 3): Pt will complete toilet transfer with Mod A     Skilled Therapeutic Interventions/Progress Updates:    Pt greeted in w/c with family playing a familiar card game. Transitioned activity to dayroom, and modified this meaningful task to incorporate therapeutic focuses of balance, Rt NMR, and cognitive remediation. He stood at elevated table with Mod A to support Rt side. OT blocked knee and facilitated R UE weightbearing for tone reduction. Pt attending to Lt>Rt visual fields to interact with family, take turns, and participate in game. He required mod vcs to follow discussed instructions. When pt had 1 card left, he said "One!" We celebrated! Longest standing window without rest 20 minutes! At end of session pt was returned to room via w/c and left with safety belt fastened and family present.  2nd Session 1:1 tx ()  Pt greeted in w/c with no c/o pain and declined toileting. OT donned Rt aircast. After handwashing, he self propelled w/c to dayroom using hemi  techniques with mod vcs for attention to obstacles on Rt side. Worked on dynamic sitting and standing balance while pt participated in wii bowling in sitting and standing positions using Stedy. Mod vcs for keeping R UE  supported on Stedy bar for weightbearing. He required close supervision-steady assist throughout. At end of session pt self propelled back to room and was left with all needs and safety belt fastened.   Therapy Documentation Precautions:  Precautions Precautions: Fall Precaution Comments: bone flap in L abdomen, no L side lying Restrictions Weight Bearing Restrictions: No Pain: Pt shook his head when asked if he had pain during both sessions Pain Assessment Pain Scale: 0-10 Pain Score: 0-No pain ADL:       See Function Navigator for Current Functional Status.   Therapy/Group: Individual Therapy  Michaela A Hoffman 03/21/2018, 12:32 PM   

## 2018-03-22 ENCOUNTER — Inpatient Hospital Stay (HOSPITAL_COMMUNITY): Payer: BLUE CROSS/BLUE SHIELD | Admitting: Physical Therapy

## 2018-03-22 ENCOUNTER — Inpatient Hospital Stay (HOSPITAL_COMMUNITY): Payer: BLUE CROSS/BLUE SHIELD

## 2018-03-22 ENCOUNTER — Inpatient Hospital Stay (HOSPITAL_COMMUNITY): Payer: BLUE CROSS/BLUE SHIELD | Admitting: Occupational Therapy

## 2018-03-22 NOTE — Progress Notes (Signed)
Occupational Therapy Session Note  Patient Details  Name: Xavier Villegas MRN: [id number] Date of Birth: 1981/06/15  Today's Date: 03/22/2018 OT Individual Time: 1962-2297 OT Individual Time Calculation (min): 73 min    Short Term Goals: Week 3:  OT Short Term Goal 1 (Week 3): Pt will initiate UB hemi dressing strategies during 1 ADL session  OT Short Term Goal 2 (Week 3): Pt will complete 4/4 components of donning shirt with close supervision for balance  OT Short Term Goal 3 (Week 3): Pt will complete 1/3 components of donning pants while sitting unsupported with close supervision   OT Short Term Goal 4 (Week 3): Pt will complete toilet transfer with Mod A   Skilled Therapeutic Interventions/Progress Updates:     Upon entering the room, pt seated in wheelchair with no c/o pain. Father present for observation and education this session. Pt propelled wheelchair to closet to obtain clothing items for dressing after shower. Pt transferred from wheelchair > TTB with mod A and min verbal cues for placement for safety. Pt seated on TTB for bathing and standing while holding onto grab bar with min A for balance during assistance to wash buttocks. Pt returning to wheelchair in same manner and seated at sink for grooming tasks with set up A. Pt donning pull over shirt with hemiplegic technique. Pt placing LE's into figure four position to thread pants and don sock and shoes utilizing one handed technique. Pt standing with min - mod A for standing balance during LB clothing management. Pt questing to take rest break secondary to fatigue. PROM for R UE with pt grimacing during elbow flexion and rubs from elbow to hand when asked about pain. No c/o shoulder pain. OT placed  R UE onto lap tray and placed in neutral position. Discussed importance of resting hand splint at night to keep hand extended. Pt remained in wheelchair with alarm belt donned and all needs within reach.   Therapy  Documentation Precautions:  Precautions Precautions: Fall Precaution Comments: bone flap in L abdomen, no L side lying Restrictions Weight Bearing Restrictions: No General:   Vital Signs: Therapy Vitals Temp: 97.8 F (36.6 C) Temp Source: Oral Pulse Rate: 74 Resp: 16 BP: 124/87 Patient Position (if appropriate): Sitting Oxygen Therapy SpO2: 99 % O2 Device: Room Air Pain: Pain Assessment Pain Scale: 0-10 Pain Score: 0-No pain Pain Location: Head Pain Descriptors / Indicators: Aching Pain Frequency: Intermittent Patients Stated Pain Goal: 0 Pain Intervention(s): Medication (See eMAR)  See Function Navigator for Current Functional Status.   Therapy/Group: Individual Therapy  [name] 03/22/2018, 3:48 PM

## 2018-03-22 NOTE — Progress Notes (Signed)
Subjective/Complaints: Up in chair. No new complaints today.   ROS: limited due to language/communication    Objective: Vital Signs: Blood pressure 115/86, pulse 74, temperature 98 F (36.7 C), resp. rate 16, height 6' (1.829 m), weight 83.2 kg, SpO2 98 %. No results found. Results for orders placed or performed during the hospital encounter of 03/01/18 (from the past 72 hour(s))  CBC     Status: Abnormal   Collection Time: 03/21/18  6:03 AM  Result Value Ref Range   WBC 9.0 4.0 - 10.5 K/uL   RBC 3.95 (L) 4.22 - 5.81 MIL/uL   Hemoglobin 14.4 13.0 - 17.0 g/dL   HCT 40.6 39.0 - 52.0 %   MCV 102.8 (H) 78.0 - 100.0 fL   MCH 36.5 (H) 26.0 - 34.0 pg   MCHC 35.5 30.0 - 36.0 g/dL   RDW 12.6 11.5 - 15.5 %   Platelets 240 150 - 400 K/uL    Comment: Performed at [facility] [address], [place], [place] [address]  Basic metabolic panel     Status: None   Collection Time: 03/21/18  6:03 AM  Result Value Ref Range   Sodium 139 135 - 145 mmol/L   Potassium 3.8 3.5 - 5.1 mmol/L   Chloride 104 98 - 111 mmol/L   CO2 27 22 - 32 mmol/L   Glucose, Bld 97 70 - 99 mg/dL   BUN 19 6 - 20 mg/dL   Creatinine, Ser 0.66 0.61 - 1.24 mg/dL   Calcium 9.2 8.9 - 10.3 mg/dL   GFR calc non Af Amer >60 >60 mL/min   GFR calc Af Amer >60 >60 mL/min    Comment: (NOTE) The eGFR has been calculated using the CKD EPI equation. This calculation has not been validated in all clinical situations. eGFR's persistently <60 mL/min signify possible Chronic Kidney Disease.    Anion gap 8 5 - 15    Comment: Performed at [facility] [address], [place], [place] [address]     Constitutional: No distress . Vital signs reviewed. HEENT: EOMI, oral membranes moist Neck: supple Cardiovascular: RRR without murmur. No JVD    Respiratory: CTA Bilaterally without wheezes or rales. Normal effort    GI: BS +, non-tender, non-distended   Neuro:  Expressive language deficits. Improving yes/no, initiates  occasional word/phrase Motor: lUE/LLE: 5/5 proximal distal RUE/RLE; 0/5 proximal distal---has trace to 1+ prox right HF/HE  MAS: Left elbow and finger flexors 1-2/4,, pecs 2/4.  right ankle and plantar flexors tr to 1/4, toes up, DTR's remain 3+ Right Musc/Skel:  No edema or tenderness in extremities Psych: pleasant and cooperative Skin: wounds cdi   Assessment/Plan: 1. Functional deficits secondary to left MCA infarct with hemorrhagic transformation status post craniectomy which require 3+ hours per day of interdisciplinary therapy in a comprehensive inpatient rehab setting. Physiatrist is providing close team supervision and 24 hour management of active medical problems listed below. Physiatrist and rehab team continue to assess barriers to discharge/monitor patient progress toward functional and medical goals. FIM: Function - Bathing Bathing activity did not occur: Refused Position: Shower Body parts bathed by patient: Right arm, Chest, Abdomen, Front perineal area, Right upper leg, Left upper leg, Left lower leg, Right lower leg Body parts bathed by helper: Left arm, Back, Buttocks Assist Level: Touching or steadying assistance(Pt > 75%)  Function- Upper Body Dressing/Undressing What is the patient wearing?: Pull over shirt/dress Pull over shirt/dress - Perfomed by patient: Thread/unthread left sleeve, Put head  through opening, Pull shirt over trunk, Thread/unthread right sleeve Pull over shirt/dress - Perfomed by helper: Thread/unthread right sleeve Assist Level: Supervision or verbal cues, Set up Set up : To obtain clothing/put away Function - Lower Body Dressing/Undressing What is the patient wearing?: Pants, Socks, Shoes Position: Wheelchair/chair at sink Pants- Performed by patient: Thread/unthread left pants leg, Pull pants up/down, Thread/unthread right pants leg Pants- Performed by helper: Thread/unthread left pants leg, Thread/unthread right pants leg, Pull pants  up/down Non-skid slipper socks- Performed by patient: Don/doff right sock, Don/doff left sock Non-skid slipper socks- Performed by helper: Don/doff right sock, Don/doff left sock Socks - Performed by patient: Don/doff right sock, Don/doff left sock Socks - Performed by helper: Don/doff right sock, Don/doff left sock Shoes - Performed by patient: Don/doff right shoe, Don/doff left shoe Shoes - Performed by helper: Don/doff right shoe, Don/doff left shoe, Fasten left, Fasten right AFO - Performed by helper: Don/doff right AFO Assist for footwear: Maximal assist Assist for lower body dressing: Touching or steadying assistance (Pt > 75%)  Function - Toileting Toileting activity did not occur: No continent bowel/bladder event Toileting steps completed by patient: Adjust clothing after toileting, Adjust clothing prior to toileting Toileting steps completed by helper: Adjust clothing prior to toileting Toileting Assistive Devices: Grab bar or rail Assist level: Two helpers  Function - Air cabin crew transfer activity did not occur: Safety/medical concerns Toilet transfer assistive device: Elevated toilet seat/BSC over toilet Mechanical lift: Stedy Assist level to toilet: Maximal assist (Pt 25 - 49%/lift and lower) Assist level from toilet: Maximal assist (Pt 25 - 49%/lift and lower)  Function - Chair/bed transfer Chair/bed transfer activity did not occur: Safety/medical concerns Chair/bed transfer method: Stand pivot Chair/bed transfer assist level: Touching or steadying assistance (Pt > 75%) Chair/bed transfer assistive device: Armrests Chair/bed transfer details: Verbal cues for precautions/safety, Verbal cues for safe use of DME/AE, Manual facilitation for placement  Function - Locomotion: Wheelchair Will patient use wheelchair at discharge?: Yes Type: Manual Max wheelchair distance: 75 Assist Level: Supervision or verbal cues Assist Level: Supervision or verbal cues Assist  Level: Supervision or verbal cues Turns around,maneuvers to table,bed, and toilet,negotiates 3% grade,maneuvers on rugs and over doorsills: No Function - Locomotion: Ambulation Ambulation activity did not occur: Safety/medical concerns Assistive device: Other (comment)(HW) Max distance: 41 Assist level: Moderate assist (Pt 50 - 74%) Assist level: Moderate assist (Pt 50 - 74%) Walk 50 feet with 2 turns activity did not occur: Safety/medical concerns Assist level: Moderate assist (Pt 50 - 74%) Walk 150 feet activity did not occur: Safety/medical concerns Walk 10 feet on uneven surfaces activity did not occur: Safety/medical concerns  Function - Comprehension Comprehension: Auditory Comprehension assist level: Follows basic conversation/direction with no assist  Function - Expression Expression: Verbal Expression assistive device: Communication board Expression assist level: Expresses basic 50 - 74% of the time/requires cueing 25 - 49% of the time. Needs to repeat parts of sentences., Expresses basic 25 - 49% of the time/requires cueing 50 - 75% of the time. Uses single words/gestures.  Function - Social Interaction Social Interaction assist level: Interacts appropriately 90% of the time - Needs monitoring or encouragement for participation or interaction.  Function - Problem Solving Problem solving assist level: Solves basic 90% of the time/requires cueing < 10% of the time, Solves basic 75 - 89% of the time/requires cueing 10 - 24% of the time  Function - Memory Memory assist level: Recognizes or recalls 90% of the time/requires cueing < 10% of  the time Patient normally able to recall (first 3 days only): Current season, Location of own room, Staff names and faces, That he or she is in a hospital   Medical Problem List and Plan: 1.  Right hemiparesis, dysphagia, and aphasia secondary to large left MCA infarct with hemorrhagic transformation s/p hemicraniectomy.  Continue  CIR  Continue Helmet for safety OOB    2.  DVT Prophylaxis/Anticoagulation: Mechanical: Sequential compression devices, below knee Bilateral lower extremities 3. Pain Management: Tylenol as needed  -baclofen titrated to 49m qid  -splint/ROM 4. Mood: Consult LCSW  for evaluation and support 5. Neuropsych: This patient is not fully capable of making decisions on his own behalf. 6. Skin/Wound Care: Routine pressure relief measures.  Monitor wound for healing.  Maintain adequate nutritional status. 7. Fluids/Electrolytes/Nutrition: Monitor I's/O's.   Encourage nectar thick fluid intake.     8. Dysphagia: Continue dysphagia 3, nectar liquids    -pushing fluids 9. Leucocytosis: ?Progressive and felt to be reactive--ID recommends d/c antibiotic and monitor for signs of infection.   Mother with history of CLL.  WBCs 10.5 8/2--> 9.0 8/12 10. High homocysteine levels/low folate levels/boderline low B12: On high dose folic supplement, B6 and received B12 inj 7/14  . MRHFR--heterozygous /single mutation ID.  May cause abnormal clotting 11. GERD: Continue PPI.  12. Hyponatremia  Sodium 139 on 8/12 13. Neurogenic bladder:  -incontinent but retention improved  -bladder training     [place], 9[place] AM

## 2018-03-22 NOTE — Progress Notes (Signed)
Physical Therapy Session Note  Patient Details  Name: Xavier Villegas MRN: [id number] Date of Birth: Apr 07, 1981  Today's Date: 03/22/2018 PT Individual Time: 1100-1200&1430-1530 PT Individual Time Calculation (min): 60 min & 60 min  Short Term Goals: Week 3:  PT Short Term Goal 1 (Week 3): Pt will ambulate 34' with LRAD and min assist PT Short Term Goal 2 (Week 3): Pt will negotiate 4 steps with LHR and max assist PT Short Term Goal 3 (Week 3): Pt will propel w/c in community environment with min assist and mod verbal cues for R attention   Skilled Therapeutic Interventions/Progress Updates:   Session 1: Pt gestures to no pain prior to session start. Pt propels w/c from room>gym for 75 ft using L hemi technique requiring supervision for R visual scanning to avoid obstacles. Pt ambulates with HW and R GRAFO for 40 feet with therapist providing Mod A for weight shifting and proper R foot placement to allow L LE advancement. Pt required multimodal cues to maintain equal step length and demonstrated good carry over when performing ambulatory transfer with [facility] from w/c>mat for 10 feet with therapist providing same level of assist. Pt performs stand pivot transfers and STS consistently with CGA throughout session duration. Pt performs stair training ascending/ descending 3 inch step for 2x5 reps using L HR and therapist providing mod A for weight shifting and advancement of R LE. Pt required seated rest breaks in between trials. Pt then ascended/descended 4 steps with mod A and L HR and multimodal cues for R quad activation when descending L LE for improved stability. Pt performs x1 trials of pipe tree design in standing with 4 inch block under L LE to promote inc WB through R LE and WB through R UE resting on block placed on table. Pt was able to perform simple pipe tree design with minimal cueing and <15% error. Pt then rested and propelled w/c back to room for 75 ft using L hemi technique and  supervision. Pt positioned in chair in room with tray table and call bell in reach and all needs met.   Session 2: Pt gestures to slight discomfort to R forearm prior to session start and pain monitored throughout session duration. Pt propels w/c from room>dayroom for 50 feet with L hemi technique and supervision for R visual field scanning to avoid obstacles. Pt performs all transfers CGA consistently. Pt performs NuStep for 10 min on level 9 with L UE and R LE for NMR and strengthening with R LE adapter to maintain proper LE placement. Pt propels w/c from day room>gym for 75 feet using L hemi technique and supervision. Pt ambulates 55 feet in hall using HW and R GRAFO with therapist providing mod A for weight shifting and R LE foot placement fading to increased mod A for R LE advancement and placement for last 10 feet 2/2 to fatigue. Pt performs alternating kicking of ball with 75% accuracy between R & L when called out with therapist providing mod A for weight shift to allow advancement of LE for kicking. Activity performed until fatigue and Pt required seated rest break. Pt performs x7 STS from mat with B UE's on R distal thigh to inc WB through R LE for NMR and therapist providing mod A to facilitate weight shifting at pelvis and maintain WB through R LE. Pt transferred impulsively from mat>w/c with close supervision and PT educated on safety awareness. PT propelled Pt back to room for time management and left  pt sitting in chair with alarm activated, call bell in reach and all needs met.   Therapy Documentation Precautions:  Precautions Precautions: Fall Precaution Comments: bone flap in L abdomen, no L side lying Restrictions Weight Bearing Restrictions: No   See Function Navigator for Current Functional Status.   Therapy/Group: Individual Therapy  Floreen Comber 03/22/2018, 12:02 PM

## 2018-03-22 NOTE — Progress Notes (Signed)
Speech Language Pathology Daily Session Note  Patient Details  Name: Xavier Villegas MRN: [id number] Date of Birth: [birth year]  Today's Date: 03/22/2018 SLP Individual Time: 1300-1330 SLP Individual Time Calculation (min): 30 min  Short Term Goals: Week 3: SLP Short Term Goal 1 (Week 3): Pt will demonstrate selective attention in mildly distracting environment for 45 minutes with supervision cues.  SLP Short Term Goal 2 (Week 3): Pt will complete mildly complex problem solving tasks with Min A cues.  SLP Short Term Goal 3 (Week 3): Pt will self-monitor and self correct functional errors with supervision A verbal cues in problem solving tasks. SLP Short Term Goal 4 (Week 3): Pt will utilize multimodal communication to express wants/needs with Min A cues in 9 out of 10 opportunities.  SLP Short Term Goal 5 (Week 3): Pt will locate items to the right of midline during basic, familar tasks with Min A cues.  SLP Short Term Goal 6 (Week 3): Pt will produce vocalizations in 5 out of 10 opportunities with Mod A cues.   Skilled Therapeutic Interventions:Skilled ST services focused on cognitive skills. Pt utilized gestures and response to yes/no questions to indicate he wanted his left leg rest removed in order to ambulate in [facility] out of room, with supervision A verbal cues for problem solving leaving room. Pt required mod A verbal cues for attention to right side during ambulation in [facility] within the hallway to the speech room. SLP facilitated basic problem solving and error awareness utilizing card game Blink, very basic modified level for yes/no question response to compare/contrast mod- min A verbal cues. Pt expressed appropriate apxromation "numer" for number during problem solving task. SLP facilitated expressive speech utilizing MIT for "bye bye" pt required initial max A verbal/visual cues fading to mod-min A verbal cue. Pt required mod-min A verbal cues to attention right side ambulating in WC back  to room. Pt expressed "alright ok bye" in response to SLP's verbal "bye bye." Pt was left in room with call bell within reach and chair alaram set.ST reccomends to continue skilled ST services.     Function:  Eating Eating   Modified Consistency Diet: No Eating Assist Level: Set up assist for;Supervision or verbal cues   Eating Set Up Assist For: Opening containers;Cutting food Helper [place] on Utensil: Occasionally Helper Brings Food to Mouth: Occasionally   Cognition Comprehension Comprehension assist level: Understands basic 90% of the time/cues < 10% of the time  Expression Expression assistive device: Communication board Expression assist level: Expresses basic 50 - 74% of the time/requires cueing 25 - 49% of the time. Needs to repeat parts of sentences.;Expresses basic 25 - 49% of the time/requires cueing 50 - 75% of the time. Uses single words/gestures.  Social Interaction Social Interaction assist level: Interacts appropriately 90% of the time - Needs monitoring or encouragement for participation or interaction.  Problem Solving Problem solving assist level: Solves basic 50 - 74% of the time/requires cueing 25 - 49% of the time;Solves basic 25 - 49% of the time - needs direction more than half the time to initiate, plan or complete simple activities  Memory Memory assist level: Recognizes or recalls 75 - 89% of the time/requires cueing 10 - 24% of the time    Pain Pain Assessment Pain Scale: 0-10 Pain Score: 0-No pain Pain Location: Head Pain Descriptors / Indicators: Aching Pain Frequency: Intermittent Patients Stated Pain Goal: 0 Pain Intervention(s): Medication (See eMAR)  Therapy/Group: Individual Therapy  Callin Ashe  [facility] 03/22/2018,  2:05 PM

## 2018-03-23 ENCOUNTER — Inpatient Hospital Stay (HOSPITAL_COMMUNITY): Payer: BLUE CROSS/BLUE SHIELD | Admitting: Occupational Therapy

## 2018-03-23 ENCOUNTER — Inpatient Hospital Stay (HOSPITAL_COMMUNITY): Payer: BLUE CROSS/BLUE SHIELD | Admitting: Physical Therapy

## 2018-03-23 ENCOUNTER — Inpatient Hospital Stay (HOSPITAL_COMMUNITY): Payer: BLUE CROSS/BLUE SHIELD

## 2018-03-23 NOTE — Progress Notes (Signed)
Physical Therapy Session Note  Patient Details  Name: Xavier Villegas MRN: [id number] Date of Birth: Jun 01, 1981  Today's Date: 03/23/2018 PT Individual Time: 1300-1415 PT Individual Time Calculation (min): 75 min   Short Term Goals: Week 3:  PT Short Term Goal 1 (Week 3): Pt will ambulate 40' with LRAD and min assist PT Short Term Goal 2 (Week 3): Pt will negotiate 4 steps with LHR and max assist PT Short Term Goal 3 (Week 3): Pt will propel w/c in community environment with min assist and mod verbal cues for R attention   Skilled Therapeutic Interventions/Progress Updates:    Pt gestures to no pain prior to session start. Pt propelled w/c from room>w/c for 75 feet using L hemi technique and supervision for R visual scanning to avoid obstacles in hall. Pt participated in orthotic consult. Pt ambulates x2 trials for 56' and 73' with HW and therapist providing mod A for facilitation of weight shift and R LE foot placement with Pt able to advance R LE. Pt performs multiple trials of stand pivot transfers in both directions with VC's for maintaining forward trunk flexion and proper foot placement to reduce LOB potential. Therapist provided min A progressing to supervision as Pt demonstrated good carry over with practice. PT returned Pt to room due to incontinent episode and Pt able to perform bed mobility with min A to maintain L side precautions. Pt able to doff and donn pants with min A for looping R LE and bridge and roll towards his R side to allow changing of brief. Pt then propels w/c back to gym for 75 feet with supervision for R visual scanning to avoid obstacles. Pt then performs standing balance activity incorporating functionally reaching for colored bean bags and tossing them to designated cone. Therapist provided mod A throughout all trials for maintaining proper weight shifting providing tactile feedback to shift weight for equal weight bearing to dec leaning on therapist. Pt used HW  for occassional UE assist for steadying. Pt then progressed to same activity with L LE on 1 inch block to facilitate increased R LE WB for NMR. Pt able to retrieve proper colored bean bag called out by therapist with 75% accuracy and toss to designated cone. PT propelled Pt back to room for time management and Pt doff and don new shirt due to saturation with min A for looping RUE. Pt left in chair with alarm activated, tray table and call bell in reach and all needs met. Therapy Documentation Precautions:  Precautions Precautions: Fall Precaution Comments: bone flap in L abdomen, no L side lying Restrictions Weight Bearing Restrictions: No   See Function Navigator for Current Functional Status.   Therapy/Group: Individual Therapy  Floreen Comber 03/23/2018, 3:53 PM

## 2018-03-23 NOTE — Progress Notes (Signed)
Subjective/Complaints: Just finished breakfast. No new complaints today.   ROS: Patient denies fever, rash, sore throat, blurred vision, nausea, vomiting, diarrhea, cough, shortness of breath or chest pain, joint or back pain, headache, or mood change.    Objective: Vital Signs: Blood pressure 102/69, pulse 67, temperature 97.6 F (36.4 C), temperature source Oral, resp. rate 18, height 6' (1.829 m), weight 83.2 kg, SpO2 98 %. No results found. Results for orders placed or performed during the hospital encounter of 03/01/18 (from the past 72 hour(s))  CBC     Status: Abnormal   Collection Time: 03/21/18  6:03 AM  Result Value Ref Range   WBC 9.0 4.0 - 10.5 K/uL   RBC 3.95 (L) 4.22 - 5.81 MIL/uL   Hemoglobin 14.4 13.0 - 17.0 g/dL   HCT 40.6 39.0 - 52.0 %   MCV 102.8 (H) 78.0 - 100.0 fL   MCH 36.5 (H) 26.0 - 34.0 pg   MCHC 35.5 30.0 - 36.0 g/dL   RDW 12.6 11.5 - 15.5 %   Platelets 240 150 - 400 K/uL    Comment: Performed at [facility] [address] [place], [place] [address]  Basic metabolic panel     Status: None   Collection Time: 03/21/18  6:03 AM  Result Value Ref Range   Sodium 139 135 - 145 mmol/L   Potassium 3.8 3.5 - 5.1 mmol/L   Chloride 104 98 - 111 mmol/L   CO2 27 22 - 32 mmol/L   Glucose, Bld 97 70 - 99 mg/dL   BUN 19 6 - 20 mg/dL   Creatinine, Ser 0.66 0.61 - 1.24 mg/dL   Calcium 9.2 8.9 - 10.3 mg/dL   GFR calc non Af Amer >60 >60 mL/min   GFR calc Af Amer >60 >60 mL/min    Comment: (NOTE) The eGFR has been calculated using the CKD EPI equation. This calculation has not been validated in all clinical situations. eGFR's persistently <60 mL/min signify possible Chronic Kidney Disease.    Anion gap 8 5 - 15    Comment: Performed at [facility] [address] [place], [place] [address]     Constitutional: No distress . Vital signs reviewed. HEENT: EOMI, oral membranes moist Neck: supple Cardiovascular: RRR without murmur. No JVD     Respiratory: CTA Bilaterally without wheezes or rales. Normal effort    GI: BS +, non-tender, non-distended  Neuro:  Expressive language deficits. Improving yes/no, initiates occasional word/phrase Motor: lUE/LLE: 5/5 proximal distal RUE/RLE; 0/5 proximal distal---has trace to 1+ prox right HF/HE  MAS: Left elbow and finger flexors 1/4,, pecs 1/4.  right ankle and plantar flexors tr to 1/4, toes up, DTR's remain 3+ Right Musc/Skel:  No right arm or leg pain with ROM Psych: pleasant and cooperative Skin: wounds cdi   Assessment/Plan: 1. Functional deficits secondary to left MCA infarct with hemorrhagic transformation status post craniectomy which require 3+ hours per day of interdisciplinary therapy in a comprehensive inpatient rehab setting. Physiatrist is providing close team supervision and 24 hour management of active medical problems listed below. Physiatrist and rehab team continue to assess barriers to discharge/monitor patient progress toward functional and medical goals. FIM: Function - Bathing Bathing activity did not occur: Refused Position: Shower Body parts bathed by patient: Right arm, Chest, Abdomen, Front perineal area, Right upper leg, Left upper leg, Left lower leg, Right lower leg, Left arm Body parts bathed by helper: Buttocks Assist Level: Touching or steadying assistance(Pt > 75%)  Function- Upper Body Dressing/Undressing What is the patient wearing?: Pull over shirt/dress Pull over shirt/dress - Perfomed by patient: Thread/unthread left sleeve, Put head through opening, Pull shirt over trunk, Thread/unthread right sleeve Pull over shirt/dress - Perfomed by helper: Thread/unthread right sleeve Assist Level: Supervision or verbal cues, Set up Set up : To obtain clothing/put away Function - Lower Body Dressing/Undressing What is the patient wearing?: Pants, Socks, Shoes Position: Wheelchair/chair at sink Pants- Performed by patient: Thread/unthread left pants  leg, Pull pants up/down, Thread/unthread right pants leg Pants- Performed by helper: Thread/unthread left pants leg, Thread/unthread right pants leg, Pull pants up/down Non-skid slipper socks- Performed by patient: Don/doff right sock, Don/doff left sock Non-skid slipper socks- Performed by helper: Don/doff right sock, Don/doff left sock Socks - Performed by patient: Don/doff right sock, Don/doff left sock Socks - Performed by helper: Don/doff right sock, Don/doff left sock Shoes - Performed by patient: Don/doff right shoe, Don/doff left shoe Shoes - Performed by helper: Don/doff right shoe, Don/doff left shoe, Fasten left, Fasten right AFO - Performed by helper: Don/doff right AFO Assist for footwear: Supervision/touching assist Assist for lower body dressing: Touching or steadying assistance (Pt > 75%)  Function - Toileting Toileting activity did not occur: No continent bowel/bladder event Toileting steps completed by patient: Adjust clothing after toileting, Adjust clothing prior to toileting Toileting steps completed by helper: Adjust clothing prior to toileting Toileting Assistive Devices: Grab bar or rail Assist level: Two helpers  Function - Air cabin crew transfer activity did not occur: Safety/medical concerns Toilet transfer assistive device: Elevated toilet seat/BSC over toilet Mechanical lift: Stedy Assist level to toilet: Maximal assist (Pt 25 - 49%/lift and lower) Assist level from toilet: Maximal assist (Pt 25 - 49%/lift and lower)  Function - Chair/bed transfer Chair/bed transfer activity did not occur: Safety/medical concerns Chair/bed transfer method: Stand pivot, Ambulatory Chair/bed transfer assist level: Moderate assist (Pt 50 - 74%/lift or lower) Chair/bed transfer assistive device: Armrests(HW) Chair/bed transfer details: Verbal cues for precautions/safety, Verbal cues for safe use of DME/AE, Manual facilitation for placement, Tactile cues for weight  shifting, Tactile cues for sequencing, Tactile cues for placement, Tactile cues for weight bearing, Manual facilitation for weight shifting, Manual facilitation for weight bearing  Function - Locomotion: Wheelchair Will patient use wheelchair at discharge?: Yes Type: Manual Max wheelchair distance: 75 Assist Level: Supervision or verbal cues Assist Level: Supervision or verbal cues Assist Level: Supervision or verbal cues Turns around,maneuvers to table,bed, and toilet,negotiates 3% grade,maneuvers on rugs and over doorsills: No Function - Locomotion: Ambulation Ambulation activity did not occur: Safety/medical concerns Assistive device: Other (comment)(HW) Max distance: 40 Assist level: Moderate assist (Pt 50 - 74%) Assist level: Moderate assist (Pt 50 - 74%) Walk 50 feet with 2 turns activity did not occur: Safety/medical concerns Assist level: Moderate assist (Pt 50 - 74%) Walk 150 feet activity did not occur: Safety/medical concerns Walk 10 feet on uneven surfaces activity did not occur: Safety/medical concerns  Function - Comprehension Comprehension: Auditory Comprehension assist level: Understands basic 90% of the time/cues < 10% of the time, Follows basic conversation/direction with extra time/assistive device  Function - Expression Expression: Verbal Expression assistive device: Communication board Expression assist level: Expresses basic 50 - 74% of the time/requires cueing 25 - 49% of the time. Needs to repeat parts of sentences., Expresses basic 25 - 49% of the time/requires cueing 50 - 75% of the time. Uses single words/gestures.  Function - Social Interaction Social Interaction assist level: Interacts appropriately 90%  of the time - Needs monitoring or encouragement for participation or interaction.  Function - Problem Solving Problem solving assist level: Solves complex 90% of the time/cues < 10% of the time, Solves basic 90% of the time/requires cueing < 10% of the  time  Function - Memory Memory assist level: Recognizes or recalls 75 - 89% of the time/requires cueing 10 - 24% of the time Patient normally able to recall (first 3 days only): Current season, Location of own room, Staff names and faces, That he or she is in a hospital   Medical Problem List and Plan: 1.  Right hemiparesis, dysphagia, and aphasia secondary to large left MCA infarct with hemorrhagic transformation s/p hemicraniectomy.  Continue CIR  Continue Helmet for safety OOB    2.  DVT Prophylaxis/Anticoagulation: Mechanical: Sequential compression devices, below knee Bilateral lower extremities 3. Pain Management: Tylenol as needed  -baclofen titrated to 27m qid with improved tone  -splint/ROM 4. Mood: Consult LCSW  for evaluation and support 5. Neuropsych: This patient is not fully capable of making decisions on his own behalf. 6. Skin/Wound Care: Routine pressure relief measures.  Monitor wound for healing.  Maintain adequate nutritional status. 7. Fluids/Electrolytes/Nutrition: Monitor I's/O's.   Encourage nectar thick fluid intake.     8. Dysphagia: Continue dysphagia 3, nectar liquids    -pushing fluids 9. Leucocytosis: ?Progressive and felt to be reactive--ID recommends d/c antibiotic and monitor for signs of infection.   Mother with history of CLL.  WBCs 10.5 8/2--> 9.0 8/12 10. High homocysteine levels/low folate levels/boderline low B12: On high dose folic supplement, B6 and received B12 inj 7/14  . MRHFR--heterozygous /single mutation ID.  May cause abnormal clotting 11. GERD: Continue PPI.  12. Hyponatremia  Sodium 139 on 8/12 13. Neurogenic bladder:  -incontinence improving  -bladder training     LOS (Days) 2Los YbanezEVALUATION WAS PERFORMED  ZMeredith Staggers8/14/2019, 12:42 PM

## 2018-03-23 NOTE — Patient Care Conference (Signed)
Inpatient RehabilitationTeam Conference and Plan of Care Update Date: 03/22/2018   Time: 2:05 PM    Patient Name: Xavier Villegas [facility]      Medical Record Number: [id number]  Date of Birth: [birth year] Sex: Male         Room/Bed: 4W12C/4W12C-01 Payor Info: Payor: [place] / Plan: [employer] OTHER / Product Type: *No Product type* /    Admitting Diagnosis: CVA  Admit Date/Time:  03/01/2018  6:01 PM Admission Comments: No comment available   Primary Diagnosis:  <principal problem not specified> Principal Problem: <principal problem not specified>  Patient Active Problem List   Diagnosis Date Noted  . Hyponatremia   . Cerebral infarction due to embolism of left middle cerebral artery (HCC) s/p thrombectomy 03/01/2018  . Carotid artery dissection ([place]) Left 03/01/2018  . Acute ischemic right MCA stroke ([place]) 03/01/2018  . Right hemiparesis ([place])   . Dysphagia, post-stroke   . Global aphasia   . Leukocytosis   . Folic acid deficiency 53/97/6734  . B12 deficiency 02/23/2018  . Hyperhomocysteinemia ([place]) 02/23/2018  . Smoker     Expected Discharge Date: Expected Discharge Date: 04/06/18  Team Members Present: Physician leading conference: Dr. Alger Simons Social Worker Present: Alfonse Alpers, LCSW Nurse Present: Dorien Chihuahua, RN PT Present: Dwyane Dee, PT OT Present: Benay Pillow, OT SLP Present: Charolett Bumpers, SLP PPS Coordinator present : Daiva Nakayama, RN, CRRN     Current Status/Progress Goal Weekly Team Focus  Medical   showing progress with communication, still incontinent, tone is an ongoing issue  improve ROM/communication  see above   Bowel/Bladder   Continent of bowel, continent/incontinent of bladder, LBM 8/12  continur continence of bowel & regain bladder continence  timed toileting program   Swallow/Nutrition/ Hydration             ADL's   Mod-Max A bathroom transfers. Mod A for balance assist during dynamic ADL tasks   Supervision-Min A    Rt NMR, cognition, balance, functional transfers, pt/family education    Mobility   mod A for gait with HW; CGA for transfers; mod A for x4 3 inch steps  min guard/assist overall  R attention, NMR, balance, functional mobility, cognitive remediation   Communication   Mod-Max A  Mod A  spelling on communication board with/without written aid, melodic intonation therapy common phrases, vowels   Safety/Cognition/ Behavioral Observations  Mod A  Min A, Sup A  semi-complex problem solving without large language component, error awareness, selective attention   Pain   no pain, Pt has tylenol PRN, baclofen & muscle rub  pain scale <3  Continue to assess & treat as needed   Skin   Bone flap left abdominal incision w honeycomb dsg, staples removed to left scalp  pt willbe free from infection, no further skin breakdown   Assess skin q shift and PRN    Rehab Goals Patient on target to meet rehab goals: Yes Rehab Goals Revised: none *See Care Plan and progress notes for long and short-term goals.     Barriers to Discharge  Current Status/Progress Possible Resolutions Date Resolved   Physician    Medical stability;Neurogenic Bowel & Bladder        see medical progress notes      Nursing                  PT  OT Medical stability;Home environment access/layout;Lack of/limited family support                SLP                SW                Discharge Planning/Teaching Needs:  Pt to return to his home with his parents and aunt to provide the recommended supervision/assist needed.  Therapy team would like to start family education at the end of next week.   Team Discussion:  Dr. Naaman Plummer is working on pt's tone and has adjusted his medications and he is doing a little better.  His language is improving and wounds look good.  Pt may be able to have his flap replaced soon.  Pt with some incontinence still.  Team decided that pt does not need the telesitter anymore.  Pt is  mod A with hemi walker and AFO and contact guard with transfers.  He did 4 stairs mod A.  Pt has overall goals of supervision.  Pt is mod A with transfers to the tub bench and is impulsive at times which makes things hard in tight spaces.  He's having more spontaneous speech.  Pt is supervision to mod A to stand at the sink for tasks.  Pt has more pain in his right arm and he really needs to wear his splint at night.  ST starting intonation therapy with common phrases and using letter board for spelling.  Needs mod A for problem solving.  ST will continue working on right inattention.  Revisions to Treatment Plan:  none    Continued Need for Acute Rehabilitation Level of Care: The patient requires daily medical management by a physician with specialized training in physical medicine and rehabilitation for the following conditions: Daily direction of a multidisciplinary physical rehabilitation program to ensure safe treatment while eliciting the highest outcome that is of practical value to the patient.: Yes Daily medical management of patient stability for increased activity during participation in an intensive rehabilitation regime.: Yes Daily analysis of laboratory values and/or radiology reports with any subsequent need for medication adjustment of medical intervention for : Neurological problems  Xavier Villegas, Xavier Villegas 03/23/2018, 11:28 AM

## 2018-03-23 NOTE — Progress Notes (Signed)
Speech Language Pathology Daily Session Note  Patient Details  Name: Xavier Villegas MRN: [id number] Date of Birth: 11-07-1980  Today's Date: 03/23/2018 SLP Individual Time: 0802-0900 SLP Individual Time Calculation (min): 58 min  Short Term Goals: Week 3: SLP Short Term Goal 1 (Week 3): Pt will demonstrate selective attention in mildly distracting environment for 45 minutes with supervision cues.  SLP Short Term Goal 2 (Week 3): Pt will complete mildly complex problem solving tasks with Min A cues.  SLP Short Term Goal 3 (Week 3): Pt will self-monitor and self correct functional errors with supervision A verbal cues in problem solving tasks. SLP Short Term Goal 4 (Week 3): Pt will utilize multimodal communication to express wants/needs with Min A cues in 9 out of 10 opportunities.  SLP Short Term Goal 5 (Week 3): Pt will locate items to the right of midline during basic, familar tasks with Min A cues.  SLP Short Term Goal 6 (Week 3): Pt will produce vocalizations in 5 out of 10 opportunities with Mod A cues.   Skilled Therapeutic Interventions:Skilled ST services focused on cognitive skills. Pt gestured and confirmed in response to yes/no questions that he needs his brief and selected shorts to get dressed with supervision A question cues. Pt required min A verbal cues for right attention during ambulation in hallway with WC. SLP facilitated semi-complex problem solving skills with complex PEG design pt required supervision A verbal cues and extra time. Pt required supervision A verbal cues right attention in problem solving task. SLP facilitated expressive speech utiizing MIT, pt produced "how are you" and "bye bye" phrase with initial max A verbal/visual cues fading to min A verbal cues. Pt required max A fading to mod A verbal/visual cues, in preactivity to name common objects with MIT phrase (I see a ball/pen) with phonemic errors. Pt produced vowels /oo/ and /ee/ in functional phrases,  as well constants "b" and "w." SLP facilitated communication utilizing letter board, pt required initial written aid to spell mother's name (nancy) fading to written feedback of selected letters to aid in error awareness. Upon returning to room pt demonstrated difficulty request "spirte"  Required mod A cues to utilize gestures to aid in effective message. Pt was left in room with call bell within reach and bed alaram set.ST reccomends to continue skilled ST services.      Function:  Eating Eating                 Cognition Comprehension Comprehension assist level: Understands basic 90% of the time/cues < 10% of the time;Follows basic conversation/direction with extra time/assistive device  Expression Expression assistive device: Communication board Expression assist level: Expresses basic 50 - 74% of the time/requires cueing 25 - 49% of the time. Needs to repeat parts of sentences.;Expresses basic 25 - 49% of the time/requires cueing 50 - 75% of the time. Uses single words/gestures.  Social Interaction Social Interaction assist level: Interacts appropriately 90% of the time - Needs monitoring or encouragement for participation or interaction.  Problem Solving Problem solving assist level: Solves complex 90% of the time/cues < 10% of the time;Solves basic 90% of the time/requires cueing < 10% of the time  Memory Memory assist level: Recognizes or recalls 75 - 89% of the time/requires cueing 10 - 24% of the time    Pain Pain Assessment Pain Scale: 0-10 Pain Score: 0-No pain  Therapy/Group: Individual Therapy  Vickye Astorino  [facility] 03/23/2018, 10:36 AM

## 2018-03-23 NOTE — Progress Notes (Signed)
Occupational Therapy Session Note  Patient Details  Name: Xavier Villegas MRN: [id number] Date of Birth: 06-Aug-1981  Today's Date: 03/23/2018 OT Individual Time: 4944-9675 OT Individual Time Calculation (min): 70 min    Short Term Goals: Week 3:  OT Short Term Goal 1 (Week 3): Pt will initiate UB hemi dressing strategies during 1 ADL session  OT Short Term Goal 2 (Week 3): Pt will complete 4/4 components of donning shirt with close supervision for balance  OT Short Term Goal 3 (Week 3): Pt will complete 1/3 components of donning pants while sitting unsupported with close supervision   OT Short Term Goal 4 (Week 3): Pt will complete toilet transfer with Mod A   Skilled Therapeutic Interventions/Progress Updates:    Upon entering the room, pt seated in wheelchair awaiting OT arrival. Pt propelled wheelchair with hemiplegic technique into BI gym. Focus on self ROM for R UE with pt following demonstrations with min verbal cues for proper technique and safety. Paper handout of exercises left in room for caregivers. Pt standing with min A from wheelchair and R knee blocked with min - mod A standing balance while standing for 5 minutes during dynavision task. Pt's fastest quadrant was superior L with average of 1.64 seconds and slowest at inferior R quadrant with average of 5.17 seconds. Pt was able to visually scan board and locate items on R side without cues to do so but does require increased time. Pt hitting 104 targets during timed standing. Pt taking seated rest break. Obstacle course set up for wheelchair with 75% of obstacles on R side. As pt continues to practice he had less occurrence of hitting obstacles on R side. Pt hitting 2/9 obstacles the last time he performed task with hemiplegic technique. Pt returning to room for transfer practice with hemi walker and stand pivot from wheelchair <> recliner with mod A. On last attempt, pt having significant LOB to the R requiring total A to  correct. Pt returning to sit in wheelchair at end of session with alarm belt activated and call bell within reach.   Therapy Documentation Precautions:  Precautions Precautions: Fall Precaution Comments: bone flap in L abdomen, no L side lying Restrictions Weight Bearing Restrictions: No    Pain: Pain Assessment Pain Score: 0-No pain  See Function Navigator for Current Functional Status.   Therapy/Group: Individual Therapy  [name] 03/23/2018, 12:46 PM

## 2018-03-24 ENCOUNTER — Inpatient Hospital Stay (HOSPITAL_COMMUNITY): Payer: BLUE CROSS/BLUE SHIELD

## 2018-03-24 ENCOUNTER — Inpatient Hospital Stay (HOSPITAL_COMMUNITY): Payer: BLUE CROSS/BLUE SHIELD | Admitting: Physical Therapy

## 2018-03-24 ENCOUNTER — Inpatient Hospital Stay (HOSPITAL_COMMUNITY): Payer: BLUE CROSS/BLUE SHIELD | Admitting: Speech Pathology

## 2018-03-24 ENCOUNTER — Inpatient Hospital Stay (HOSPITAL_COMMUNITY): Payer: BLUE CROSS/BLUE SHIELD | Admitting: Occupational Therapy

## 2018-03-24 NOTE — Progress Notes (Signed)
Physical Therapy Session Note  Patient Details  Name: Valentin Benney MRN: [id number] Date of Birth: [birth year]  Today's Date: 03/24/2018 PT Individual Time: 0800-0915 PT Individual Time Calculation (min): 75 min   Short Term Goals: Week 3:  PT Short Term Goal 1 (Week 3): Pt will ambulate 61' with LRAD and min assist PT Short Term Goal 2 (Week 3): Pt will negotiate 4 steps with LHR and max assist PT Short Term Goal 3 (Week 3): Pt will propel w/c in community environment with min assist and mod verbal cues for R attention   Skilled Therapeutic Interventions/Progress Updates:    Pt gestures to no pain prior to session start. Pt propels w/c from room<>gym for 75 ft with supervision for R visual scanning to avoid obstacles using L hemi technique. Pt performs ambulatory transfer from w/c> treatment mat with HW and mod A for weight shifting with improved ability to advance R LE for 10 feet. Pt doffs shoes in sitting edge of mat using figure 4 technique with occasional LOB posterior requiring min A to steady.  2 helpers assist Pt from edge of treatment mat into tall kneeling working on weight shifting for NMR and alternating anterior and retrostepping in tall kneeling. Pt unable to tolerate position for prolonged time due to B knee pain due to pressure on mat and was transitioned into long sitting with R UE weight bearing for hamstring stretch. Pt able to tolerate <5 min in long sitting due to hamstring tightness RLE>L LE. Pt then performed supine bridging in hook lying with ball between legs for core stabilization and R LE activation. Pt performed 1x10 reps & 1x5 reps with VC's for correct sequencing and mm activation. Pt then performed hook lying hip flexion with ball between legs with therapist providing min A to R LE for advancing the hip into flexion 2/2 weakness and dec activation in hips and core. Pt performed 1x10 reps with rest breaks as tolerated. Pt then performed long sitting trunk  rotation activity with simple cog task using peg board with therapist providing WB through R UE. Pt performed until fatigue and transitioned to crossed sitting performing trunk rotation working on dynamic sitting balance, functional reaching with L UE, NMR, and coordination. Pt then transitions to sitting edge of mat with +2 assist and dons AFO and shoes with min A. Pt ambulates 49 feet with HW and mod A provided by therapist for weight shifting progressing to min A for weight shifting with therapist providing tactile cues and less facilitation. Pt propels w/c back to room and remains sitting with tray table and call bell in reach, chair alarm activated, and all needs met.   Therapy Documentation Precautions:  Precautions Precautions: Fall Precaution Comments: bone flap in L abdomen, no L side lying Restrictions Weight Bearing Restrictions: No   See Function Navigator for Current Functional Status.   Therapy/Group: Individual Therapy  Floreen Comber 03/24/2018, 12:46 PM

## 2018-03-24 NOTE — Progress Notes (Signed)
Subjective/Complaints: No new complaints. Up with therapies already.   ROS: limited due to language/communication    Objective: Vital Signs: Blood pressure (!) 135/96, pulse 79, temperature 97.8 F (36.6 C), temperature source Oral, resp. rate 18, height 6' (1.829 m), weight 83.2 kg, SpO2 98 %. No results found. No results found for this or any previous visit (from the past 72 hour(s)).   Constitutional: No distress . Vital signs reviewed. HEENT: EOMI, oral membranes moist Neck: supple Cardiovascular: RRR without murmur. No JVD    Respiratory: CTA Bilaterally without wheezes or rales. Normal effort    GI: BS +, non-tender, non-distended. Flap wound intact with s/s Neuro:  Expressive language deficits. Improving yes/no, initiates occasional word/phrase Motor: lUE/LLE: 5/5 proximal distal RUE/RLE; 0/5 proximal distal---has trace to 1+ prox right HF/HE  MAS: Left elbow and finger flexors 1/4,, pecs 1/4.  right ankle and plantar flexors tr to 1/4, toes up, DTR's remain 3+ Right Musc/Skel:  No right arm or leg pain with ROM Psych: pleasant Skin: wounds cdi   Assessment/Plan: 1. Functional deficits secondary to left MCA infarct with hemorrhagic transformation status post craniectomy which require 3+ hours per day of interdisciplinary therapy in a comprehensive inpatient rehab setting. Physiatrist is providing close team supervision and 24 hour management of active medical problems listed below. Physiatrist and rehab team continue to assess barriers to discharge/monitor patient progress toward functional and medical goals. FIM: Function - Bathing Bathing activity did not occur: Refused Position: Shower Body parts bathed by patient: Right arm, Chest, Abdomen, Front perineal area, Right upper leg, Left upper leg, Left lower leg, Right lower leg, Left arm Body parts bathed by helper: Buttocks Assist Level: Touching or steadying assistance(Pt > 75%)  Function- Upper Body  Dressing/Undressing What is the patient wearing?: Pull over shirt/dress Pull over shirt/dress - Perfomed by patient: Thread/unthread left sleeve, Put head through opening, Pull shirt over trunk, Thread/unthread right sleeve Pull over shirt/dress - Perfomed by helper: Thread/unthread right sleeve Assist Level: Supervision or verbal cues, Set up Set up : To obtain clothing/put away Function - Lower Body Dressing/Undressing What is the patient wearing?: Pants, Socks, Shoes Position: Wheelchair/chair at sink Pants- Performed by patient: Thread/unthread left pants leg, Pull pants up/down, Thread/unthread right pants leg Pants- Performed by helper: Thread/unthread left pants leg, Thread/unthread right pants leg, Pull pants up/down Non-skid slipper socks- Performed by patient: Don/doff right sock, Don/doff left sock Non-skid slipper socks- Performed by helper: Don/doff right sock, Don/doff left sock Socks - Performed by patient: Don/doff right sock, Don/doff left sock Socks - Performed by helper: Don/doff right sock, Don/doff left sock Shoes - Performed by patient: Don/doff right shoe, Don/doff left shoe Shoes - Performed by helper: Don/doff right shoe, Don/doff left shoe, Fasten left, Fasten right AFO - Performed by helper: Don/doff right AFO Assist for footwear: Supervision/touching assist Assist for lower body dressing: Touching or steadying assistance (Pt > 75%)  Function - Toileting Toileting activity did not occur: No continent bowel/bladder event Toileting steps completed by patient: Adjust clothing after toileting, Adjust clothing prior to toileting Toileting steps completed by helper: Adjust clothing prior to toileting Toileting Assistive Devices: Grab bar or rail Assist level: Two helpers  Function - Air cabin crew transfer activity did not occur: Safety/medical concerns Toilet transfer assistive device: Elevated toilet seat/BSC over toilet Mechanical lift: Stedy Assist  level to toilet: Maximal assist (Pt 25 - 49%/lift and lower) Assist level from toilet: Maximal assist (Pt 25 - 49%/lift and lower)  Function - Chair/bed  transfer Chair/bed transfer activity did not occur: Safety/medical concerns Chair/bed transfer method: Stand pivot, Ambulatory Chair/bed transfer assist level: Touching or steadying assistance (Pt > 75%) Chair/bed transfer assistive device: Armrests(HW) Chair/bed transfer details: Verbal cues for precautions/safety, Verbal cues for safe use of DME/AE, Manual facilitation for placement, Tactile cues for weight shifting, Tactile cues for sequencing, Tactile cues for placement, Tactile cues for weight bearing, Manual facilitation for weight shifting, Manual facilitation for weight bearing  Function - Locomotion: Wheelchair Will patient use wheelchair at discharge?: Yes Type: Manual Max wheelchair distance: 75 Assist Level: Supervision or verbal cues Assist Level: Supervision or verbal cues Assist Level: Supervision or verbal cues Turns around,maneuvers to table,bed, and toilet,negotiates 3% grade,maneuvers on rugs and over doorsills: No Function - Locomotion: Ambulation Ambulation activity did not occur: Safety/medical concerns Assistive device: Other (comment)(HW) Max distance: 42 Assist level: Moderate assist (Pt 50 - 74%) Assist level: Moderate assist (Pt 50 - 74%) Walk 50 feet with 2 turns activity did not occur: Safety/medical concerns Assist level: Moderate assist (Pt 50 - 74%) Walk 150 feet activity did not occur: Safety/medical concerns Walk 10 feet on uneven surfaces activity did not occur: Safety/medical concerns  Function - Comprehension Comprehension: Auditory Comprehension assist level: Understands basic 90% of the time/cues < 10% of the time  Function - Expression Expression: Verbal Expression assistive device: Communication board Expression assist level: Expresses basic 50 - 74% of the time/requires cueing 25 - 49% of  the time. Needs to repeat parts of sentences., Expresses basic 25 - 49% of the time/requires cueing 50 - 75% of the time. Uses single words/gestures.  Function - Social Interaction Social Interaction assist level: Interacts appropriately 90% of the time - Needs monitoring or encouragement for participation or interaction.  Function - Problem Solving Problem solving assist level: Solves basic 25 - 49% of the time - needs direction more than half the time to initiate, plan or complete simple activities  Function - Memory Memory assist level: Recognizes or recalls 75 - 89% of the time/requires cueing 10 - 24% of the time Patient normally able to recall (first 3 days only): Current season, Location of own room, Staff names and faces, That he or she is in a hospital   Medical Problem List and Plan: 1.  Right hemiparesis, dysphagia, and aphasia secondary to large left MCA infarct with hemorrhagic transformation s/p hemicraniectomy.  Continue CIR  Continue Helmet for safety OOB    2.  DVT Prophylaxis/Anticoagulation: Mechanical: Sequential compression devices, below knee Bilateral lower extremities 3. Pain Management: Tylenol as needed  -baclofen titrated to 10mg  qid with improved tone--consider further titration  -continue splint/ROM 4. Mood: Consult LCSW  for evaluation and support 5. Neuropsych: This patient is not fully capable of making decisions on his own behalf. 6. Skin/Wound Care: Routine pressure relief measures.  Monitor wound for healing.  Maintain adequate nutritional status. 7. Fluids/Electrolytes/Nutrition: Monitor I's/O's.   Encourage nectar thick fluid intake.     8. Dysphagia: Continue dysphagia 3, nectar liquids    -pushing fluids 9. Leucocytosis: ?Progressive and felt to be reactive--ID recommends d/c antibiotic and monitor for signs of infection.   Mother with history of CLL.  WBCs 10.5 8/2--> 9.0 8/12 10. High homocysteine levels/low folate levels/boderline low B12: On  high dose folic supplement, B6 and received B12 inj 7/14  . MRHFR--heterozygous /single mutation ID.  May cause abnormal clotting 11. GERD: Continue PPI.  12. Hyponatremia  Sodium 139 on 8/12 13. Neurogenic bladder:  -incontinence improving but  still 50/50  -bladder training     LOS (Days) Boswell 03/24/2018, 11:02 AM

## 2018-03-24 NOTE — Progress Notes (Signed)
Occupational Therapy Session Note  Patient Details  Name: Xavier Villegas MRN: [id number] Date of Birth: Jul 03, 1981  Today's Date: 03/24/2018 OT Individual Time: 4935-5217 OT Individual Time Calculation (min): 40 min    Short Term Goals: Week 3:  OT Short Term Goal 1 (Week 3): Pt will initiate UB hemi dressing strategies during 1 ADL session  OT Short Term Goal 2 (Week 3): Pt will complete 4/4 components of donning shirt with close supervision for balance  OT Short Term Goal 3 (Week 3): Pt will complete 1/3 components of donning pants while sitting unsupported with close supervision   OT Short Term Goal 4 (Week 3): Pt will complete toilet transfer with Mod A   Skilled Therapeutic Interventions/Progress Updates:    Pt received sitting up in w/c agreeable to therapy. Pt changed shirt, donning and doffing with set up, demo good initiation/knowledge of hemi techniques. Pt used hemi technique to propel w/c to rehab gym with frequent vc for attention to Rt side of environment. Pt transferred to mat from w/c with CGA but required vc for safety d/t rushing through transfer and forgetting to move footrest. Pt completed standing level functional reaching and weight shifting task with bean bags and hemi walker. Pt required min-mod standing balance support throughout. Pt then completed throwing/catching task in seated for L UE coordination and then in standing for dynamic standing balance. Pt completed 10x sit to stand transfers throughout session with CGA provided. Pt was returned to room and left with chair belt alarm and all needs met.   Therapy Documentation Precautions:  Precautions Precautions: Fall Precaution Comments: bone flap in L abdomen, no L side lying Restrictions Weight Bearing Restrictions: No   Pain: Pain Assessment Pain Scale: Faces Faces Pain Scale: No hurt  See Function Navigator for Current Functional Status.   Therapy/Group: Individual Therapy  Curtis Sites 03/24/2018, 1:18 PM

## 2018-03-24 NOTE — Progress Notes (Signed)
Physical Therapy Session Note  Patient Details  Name: Xavier Villegas MRN: [id number] Date of Birth: 02/16/1981  Today's Date: 03/24/2018 PT Individual Time: 0800-0915 PT Individual Time Calculation (min): 75 min   Short Term Goals: Week 3:  PT Short Term Goal 1 (Week 3): Pt will ambulate 25' with LRAD and min assist PT Short Term Goal 2 (Week 3): Pt will negotiate 4 steps with LHR and max assist PT Short Term Goal 3 (Week 3): Pt will propel w/c in community environment with min assist and mod verbal cues for R attention   Skilled Therapeutic Interventions/Progress Updates:    Pt received seated in w/c in room, agreeable to PT. Pt indicates some L knee soreness with activity, declines any intervention for pain relief. Assisted pt with eating breakfast, setup assist to eat cereal with use of LUE. Manual w/c propulsion x 150 ft with L UE/LE with Supervision, v/c to attend to R visual field. Stand pivot transfer w/c to/from mat table with min A fading to CGA throughout therapy session with focus on LE placement and safe setup prior to initiating transfer. Sit to stand x 10 reps with 1" step under LLE to encourage weight-bearing through RLE with transfer, min to mod A. Sit to stand x 10 reps with BUE on RLE to encourage WBing through RLE. Attempt mini-squats with use of mirror for visual feedback, pt unable to initiate muscle contraction on BLE simultaneously. Focus on R knee flex/ext with manual cues to initiate muscle contraction. Standing 1" step-taps L/R, pt exhibits compensation for R hip flexor weakness with circumduction and hip hiking to reach step. Standing activity reaching for clothespins with LUE on R side to initiate weight shift to the R, focus on standing balance and WBing through RLE, mod A for weight shift and for balance. Pt left seated in w/c in room with needs in reach, chair alarm in place.  Therapy Documentation Precautions:  Precautions Precautions: Fall Precaution  Comments: bone flap in L abdomen, no L side lying Restrictions Weight Bearing Restrictions: No  See Function Navigator for Current Functional Status.   Therapy/Group: Individual Therapy  Excell Seltzer, PT, DPT  03/24/2018, 12:00 PM

## 2018-03-24 NOTE — Progress Notes (Signed)
Social Work Patient ID: Xavier Villegas, male   DOB: 1981-01-18, 37 y.o.   MRN: [id number]   CSW met with pt and later talked to his father via telephone to update them on team conference discussion.  Pt continues to make good progress and plan remains for pt to go to parents home and family will provide 24/7 supervision and assistance as needed.  CSW gave pt's father social security disability application for him to complete and CSW will assist as needed and will help him get telephone interview.  CSW will continue to follow.

## 2018-03-24 NOTE — Progress Notes (Signed)
Speech Language Pathology Daily Session Note  Patient Details  Name: Xavier Villegas MRN: [id number] Date of Birth: 03-25-1981  Today's Date: 03/24/2018 SLP Individual Time: 5681-2751 SLP Individual Time Calculation (min): 30 min  Short Term Goals: Week 3: SLP Short Term Goal 1 (Week 3): Pt will demonstrate selective attention in mildly distracting environment for 45 minutes with supervision cues.  SLP Short Term Goal 2 (Week 3): Pt will complete mildly complex problem solving tasks with Min A cues.  SLP Short Term Goal 3 (Week 3): Pt will self-monitor and self correct functional errors with supervision A verbal cues in problem solving tasks. SLP Short Term Goal 4 (Week 3): Pt will utilize multimodal communication to express wants/needs with Min A cues in 9 out of 10 opportunities.  SLP Short Term Goal 5 (Week 3): Pt will locate items to the right of midline during basic, familar tasks with Min A cues.  SLP Short Term Goal 6 (Week 3): Pt will produce vocalizations in 5 out of 10 opportunities with Mod A cues.   Skilled Therapeutic Interventions: SLP entered pt's room and found him sitting in Xavier Villegas alone in bathroom. Pt is reliable with yes/no questions and able to indicate that he had finished urinating and nursing had left him alone in bathroom in [facility] Villegas. SLP transferred pt to wheelchair. Pt's facial expressions conveyed emotional distress and sadness. Emotional support given and all needs were left within reach. This Animator of situation.     Function:   Cognition Comprehension Comprehension assist level: Follows basic conversation/direction with extra time/assistive device;Understands basic 90% of the time/cues < 10% of the time  Expression   Expression assist level: Expresses basic 25 - 49% of the time/requires cueing 50 - 75% of the time. Uses single words/gestures.  Social Interaction Social Interaction assist level: Interacts appropriately  with others with medication or extra time (anti-anxiety, antidepressant).;Interacts appropriately with others - No medications needed.  Problem Solving Problem solving assist level: Solves basic 90% of the time/requires cueing < 10% of the time;Solves basic problems with no assist  Memory Memory assist level: More than reasonable amount of time    Pain Pain Assessment Pain Scale: Faces Faces Pain Scale: No hurt  Therapy/Group: Individual Therapy  Xavier Villegas 03/24/2018, 3:48 PM

## 2018-03-25 ENCOUNTER — Inpatient Hospital Stay (HOSPITAL_COMMUNITY): Payer: BLUE CROSS/BLUE SHIELD | Admitting: Physical Therapy

## 2018-03-25 ENCOUNTER — Inpatient Hospital Stay (HOSPITAL_COMMUNITY): Payer: BLUE CROSS/BLUE SHIELD | Admitting: Occupational Therapy

## 2018-03-25 ENCOUNTER — Inpatient Hospital Stay (HOSPITAL_COMMUNITY): Payer: BLUE CROSS/BLUE SHIELD | Admitting: Speech Pathology

## 2018-03-25 NOTE — Progress Notes (Signed)
Physical Therapy Weekly Progress Note  Patient Details  Name: Xavier Villegas MRN: [id number] Date of Birth: Dec 17, 1980  Beginning of progress report period: March 17, 2018 End of progress report period: March 25, 2018  Today's Date: 03/25/2018 PT Individual Time: 1100-1200 PT Individual Time Calculation (min): 60 min   Patient has met 3 of 3 short term goals.  Pt making excellent progress towards goals.  Has demonstrated improved ability to activate RLE in functional mobility, and is currently performing transfers with CGA<>supervision, and ambulating up to 64' with hemiwalker and min fade to mod assist with fatigue.    Patient continues to demonstrate the following deficits muscle weakness, muscle joint tightness and muscle paralysis, impaired timing and sequencing, abnormal tone, unbalanced muscle activation and decreased coordination, decreased attention to right, decreased awareness, decreased problem solving, decreased safety awareness, decreased memory and delayed processing and decreased sitting balance, decreased standing balance, decreased postural control, hemiplegia and decreased balance strategies and therefore will continue to benefit from skilled PT intervention to increase functional independence with mobility.  Patient progressing toward long term goals..  Continue plan of care.  PT Short Term Goals Week 3:  PT Short Term Goal 1 (Week 3): Pt will ambulate 6' with LRAD and min assist PT Short Term Goal 1 - Progress (Week 3): Met PT Short Term Goal 2 (Week 3): Pt will negotiate 4 steps with LHR and max assist PT Short Term Goal 2 - Progress (Week 3): Met PT Short Term Goal 3 (Week 3): Pt will propel w/c in community environment with min assist and mod verbal cues for R attention  PT Short Term Goal 3 - Progress (Week 3): Met Week 4:  PT Short Term Goal 1 (Week 4): =LTGs due to ELOS  Skilled Therapeutic Interventions/Progress Updates:    no c/o pain.  Session focus  on NMR for postural control, trunk rotation, and RLE activation during functional mobility with ambulation, transfers, and ball work.    Pt transitions sit<>stand throughout session with min assist.  Gait training to therapy gym with hemiwalker, initially with min assist at shoulder/pelvis to facilitate L weight shift for RLE swing through, progress to mod assist for weight shift for final 20' 2/2 fatigue.  Sit<>stand with 4" block under LLE and UE biased to R side for forced R weight shift with sit<>stand and during standing activity focus on trunk rotation to retrieve/move clothespins.  Pt completes 2 trials to fatigue with mod assist overall and mod multimodal cues for maintaining balance.    Stand/pivot to therapy ball with mod assist for balance for new task.  Seated NMR on therapy ball focus on core strength and postural control with L<>R weight shifts, anterior/posterior pelvic tilts, and bouncing.  Pt progresses from requiring mod assist to close supervision for balance and postural control during activity. Blocked practice squat/pivot ball<>mat to R and L focus on not using UEs for balance and powering up with LEs to activate RLE.    Pt propelled w/c back to room at end of session and positioned with call bell in reach and needs met.   Therapy Documentation Precautions:  Precautions Precautions: Fall Precaution Comments: bone flap in L abdomen, no L side lying Restrictions Weight Bearing Restrictions: No   See Function Navigator for Current Functional Status.  Therapy/Group: Individual Therapy  Michel Santee 03/25/2018, 2:09 PM

## 2018-03-25 NOTE — Progress Notes (Signed)
Occupational Therapy Session Note  Patient Details  Name: Xavier Villegas MRN: [id number] Date of Birth: Dec 31, 1980  Today's Date: 03/25/2018 OT Individual Time: 8546-2703 OT Individual Time Calculation (min): 58 min   Short Term Goals: Week 3:  OT Short Term Goal 1 (Week 3): Pt will initiate UB hemi dressing strategies during 1 ADL session  OT Short Term Goal 2 (Week 3): Pt will complete 4/4 components of donning shirt with close supervision for balance  OT Short Term Goal 3 (Week 3): Pt will complete 1/3 components of donning pants while sitting unsupported with close supervision   OT Short Term Goal 4 (Week 3): Pt will complete toilet transfer with Mod A   Skilled Therapeutic Interventions/Progress Updates:    Pt greeted in w/c. Declining participation in BADL activities. After handwashing, he self propelled to dayroom (min vcs for avoidance of obstacles on Rt). Tx focus placed on dynamic standing balance and Rt NMR. Pt engaged in meaningful task of bowling with support at Rt knee. Used a 1 inch step under L LE to promote increased weight shift towards Rt side. He required mod multimodal cues for keeping both knees extended and for maintaining midline. R UE supported in give mohr sling while he used L UE dynamically to meet task demands. Min-Mod A sit<stand and for standing balance overall. Second person present to assist with activity setup. Music used therapeutically throughout session for increasing affect and promoting vocalization. At end of session pt was taken back to room and left with all needs within reach and safety belt fastened.    Therapy Documentation Precautions:  Precautions Precautions: Fall Precaution Comments: bone flap in L abdomen, no L side lying Restrictions Weight Bearing Restrictions: No Pain: R UE. Supported limb with give mohr sling during activity and repositioned limb on half lap tray to promote increased comfort. Pt declining use of antalgesic cream  in room   ADL:      See Function Navigator for Current Functional Status.   Therapy/Group: Individual Therapy  Jazsmine Macari A Deashia Soule 03/25/2018, 12:13 PM

## 2018-03-25 NOTE — Progress Notes (Signed)
Subjective/Complaints: Pt up at eob. No new problems  ROS: limited due to language/communication   Objective: Vital Signs: Blood pressure 123/85, pulse 66, temperature 97.8 F (36.6 C), temperature source Oral, resp. rate 20, height 6' (1.829 m), weight 83.2 kg, SpO2 100 %. No results found. No results found for this or any previous visit (from the past 72 hour(s)).   Constitutional: No distress . Vital signs reviewed. HEENT: EOMI, oral membranes moist Neck: supple Cardiovascular: RRR without murmur. No JVD    Respiratory: CTA Bilaterally without wheezes or rales. Normal effort    GI: BS +, non-tender, non-distended  Neuro:  Expressive language deficits. Improving yes/no, initiates occasional word/phrase Motor: lUE/LLE: 5/5 proximal distal RUE/RLE; 0/5 proximal distal---has trace to 1+ prox right HF/HE  MAS: Left elbow and finger flexors 1/4,, pecs 1/4.  right ankle and plantar flexors tr to 1/4, toes up, DTR's remain 3+ Right Musc/Skel:  No right arm or leg pain with ROM Psych: pleasant Skin: wounds cdi   Assessment/Plan: 1. Functional deficits secondary to left MCA infarct with hemorrhagic transformation status post craniectomy which require 3+ hours per day of interdisciplinary therapy in a comprehensive inpatient rehab setting. Physiatrist is providing close team supervision and 24 hour management of active medical problems listed below. Physiatrist and rehab team continue to assess barriers to discharge/monitor patient progress toward functional and medical goals. FIM: Function - Bathing Bathing activity did not occur: Refused Position: Shower Body parts bathed by patient: Right arm, Chest, Abdomen, Front perineal area, Right upper leg, Left upper leg, Left lower leg, Right lower leg, Left arm Body parts bathed by helper: Buttocks Assist Level: Touching or steadying assistance(Pt > 75%)  Function- Upper Body Dressing/Undressing What is the patient wearing?: Pull over  shirt/dress Pull over shirt/dress - Perfomed by patient: Thread/unthread left sleeve, Put head through opening, Pull shirt over trunk, Thread/unthread right sleeve Pull over shirt/dress - Perfomed by helper: Thread/unthread right sleeve Assist Level: Supervision or verbal cues, Set up Set up : To obtain clothing/put away Function - Lower Body Dressing/Undressing What is the patient wearing?: Pants, Socks, Shoes Position: Wheelchair/chair at sink Pants- Performed by patient: Thread/unthread left pants leg, Pull pants up/down, Thread/unthread right pants leg Pants- Performed by helper: Thread/unthread left pants leg, Thread/unthread right pants leg, Pull pants up/down Non-skid slipper socks- Performed by patient: Don/doff right sock, Don/doff left sock Non-skid slipper socks- Performed by helper: Don/doff right sock, Don/doff left sock Socks - Performed by patient: Don/doff right sock, Don/doff left sock Socks - Performed by helper: Don/doff right sock, Don/doff left sock Shoes - Performed by patient: Don/doff right shoe, Don/doff left shoe Shoes - Performed by helper: Don/doff right shoe, Don/doff left shoe, Fasten left, Fasten right AFO - Performed by helper: Don/doff right AFO Assist for footwear: Supervision/touching assist Assist for lower body dressing: Touching or steadying assistance (Pt > 75%)  Function - Toileting Toileting activity did not occur: No continent bowel/bladder event Toileting steps completed by patient: Adjust clothing after toileting, Adjust clothing prior to toileting Toileting steps completed by helper: Adjust clothing prior to toileting Toileting Assistive Devices: Grab bar or rail Assist level: Two helpers  Function - Air cabin crew transfer activity did not occur: Safety/medical concerns Toilet transfer assistive device: Elevated toilet seat/BSC over toilet Mechanical lift: Stedy Assist level to toilet: Maximal assist (Pt 25 - 49%/lift and  lower) Assist level from toilet: Maximal assist (Pt 25 - 49%/lift and lower)  Function - Chair/bed transfer Chair/bed transfer activity did not occur:  Safety/medical concerns Chair/bed transfer method: Stand pivot Chair/bed transfer assist level: Moderate assist (Pt 50 - 74%/lift or lower) Chair/bed transfer assistive device: Armrests Chair/bed transfer details: Verbal cues for precautions/safety, Verbal cues for safe use of DME/AE, Manual facilitation for placement, Tactile cues for weight shifting, Tactile cues for sequencing, Tactile cues for placement, Tactile cues for weight bearing, Manual facilitation for weight shifting, Manual facilitation for weight bearing  Function - Locomotion: Wheelchair Will patient use wheelchair at discharge?: Yes Type: Manual Max wheelchair distance: 75 Assist Level: Supervision or verbal cues Assist Level: Supervision or verbal cues Assist Level: Supervision or verbal cues Turns around,maneuvers to table,bed, and toilet,negotiates 3% grade,maneuvers on rugs and over doorsills: No Function - Locomotion: Ambulation Ambulation activity did not occur: Safety/medical concerns Assistive device: Other (comment)(HW) Max distance: 49 Assist level: Moderate assist (Pt 50 - 74%) Assist level: Moderate assist (Pt 50 - 74%) Walk 50 feet with 2 turns activity did not occur: Safety/medical concerns Assist level: Moderate assist (Pt 50 - 74%) Walk 150 feet activity did not occur: Safety/medical concerns Walk 10 feet on uneven surfaces activity did not occur: Safety/medical concerns  Function - Comprehension Comprehension: Auditory Comprehension assist level: Understands basic 90% of the time/cues < 10% of the time  Function - Expression Expression: Nonverbal Expression assistive device: Communication board Expression assist level: Expresses basis less than 25% of the time/requires cueing >75% of the time.  Function - Social Interaction Social Interaction  assist level: Interacts appropriately 90% of the time - Needs monitoring or encouragement for participation or interaction.  Function - Problem Solving Problem solving assist level: Solves basic 90% of the time/requires cueing < 10% of the time, Solves basic problems with no assist  Function - Memory Memory assist level: More than reasonable amount of time Patient normally able to recall (first 3 days only): Current season, Location of own room, Staff names and faces, That he or she is in a hospital   Medical Problem List and Plan: 1.  Right hemiparesis, dysphagia, and aphasia secondary to large left MCA infarct with hemorrhagic transformation s/p hemicraniectomy.  Continue CIR  Continue Helmet for safety OOB    2.  DVT Prophylaxis/Anticoagulation: Mechanical: Sequential compression devices, below knee Bilateral lower extremities 3. Pain Management: Tylenol as needed  -continue baclofen 10mg  qid  -continue splint/ROM 4. Mood: Consult LCSW  for evaluation and support 5. Neuropsych: This patient is not fully capable of making decisions on his own behalf. 6. Skin/Wound Care: Routine pressure relief measures.  Monitor wound for healing.  Maintain adequate nutritional status. 7. Fluids/Electrolytes/Nutrition: Monitor I's/O's.   Encourage nectar thick fluid intake.     8. Dysphagia: Continue dysphagia 3, nectar liquids    -pushing fluids 9. Leucocytosis: ?Progressive and felt to be reactive--ID recommends d/c antibiotic and monitor for signs of infection.   Mother with history of CLL.  WBCs 10.5 8/2--> 9.0 8/12 10. High homocysteine levels/low folate levels/boderline low B12: On high dose folic supplement, B6 and received B12 inj 7/14  . MRHFR--heterozygous /single mutation ID.  May cause abnormal clotting 11. GERD: Continue PPI.  12. Hyponatremia  Sodium 139 on 8/12 13. Neurogenic bladder:  -incontinence improving  -bladder training     LOS (Days) 24 A FACE TO FACE EVALUATION WAS  PERFORMED  Meredith Staggers 03/25/2018, 10:31 AM

## 2018-03-25 NOTE — Progress Notes (Signed)
Speech Language Pathology Weekly Progress and Session Note  Patient Details  Name: Xavier Villegas MRN: [id number] Date of Birth: Mar 04, 1981  Beginning of progress report period: March 17, 2018 End of progress report period: March 25, 2018  Today's Date: 03/25/2018   Skilled treatment session #1 SLP Individual Time: 1245-1330 SLP Individual Time Calculation (min): 45 min   Skilled treatment session #2  SLP Individual Time: 6226-3335 SLP Individual Time Calculation (min): 45 min  Short Term Goals: Week 3: SLP Short Term Goal 1 (Week 3): Pt will demonstrate selective attention in mildly distracting environment for 45 minutes with supervision cues.  SLP Short Term Goal 1 - Progress (Week 3): Met SLP Short Term Goal 2 (Week 3): Pt will complete mildly complex problem solving tasks with Min A cues.  SLP Short Term Goal 2 - Progress (Week 3): Met SLP Short Term Goal 3 (Week 3): Pt will self-monitor and self correct functional errors with supervision A verbal cues in problem solving tasks. SLP Short Term Goal 3 - Progress (Week 3): Met SLP Short Term Goal 4 (Week 3): Pt will utilize multimodal communication to express wants/needs with Min A cues in 9 out of 10 opportunities.  SLP Short Term Goal 4 - Progress (Week 3): Met SLP Short Term Goal 5 (Week 3): Pt will locate items to the right of midline during basic, familar tasks with Min A cues.  SLP Short Term Goal 5 - Progress (Week 3): Met SLP Short Term Goal 6 (Week 3): Pt will produce vocalizations in 5 out of 10 opportunities with Mod A cues.  SLP Short Term Goal 6 - Progress (Week 3): Met    New Short Term Goals: Week 4: SLP Short Term Goal 1 (Week 4): Pt will utilize multimodal communication to express wants/needs with supervision cues in 9 out of 10 opportunities.  SLP Short Term Goal 2 (Week 4): Using multimodal forms of communications pt will accurately answer questions related to safety awareness with 90% accuracy.  SLP  Short Term Goal 3 (Week 4): Given Mod A cues, pt will produce vowels in 8 out of 10 opportunities.  SLP Short Term Goal 4 (Week 4): Given Mod A cues, pt will combine consonant with vowel in 8 out of 10 opportunities.  SLP Short Term Goal 5 (Week 4): Pt will complete semi-complex problem solving tasks with supervision cues.  SLP Short Term Goal 6 (Week 4): Given Max A mutlimodal cues, pt will name common objects in 5 out of 10 opportunities.    Weekly Progress Updates: Pt has made great progress this reporting period and as a result he has met 6 of 6 STGs. Pt has made progress with selective attention, self-monitoring during communication and problem solving tasks as well as speech-language production. Pt with increased production of vowels including /oo/, /ee/, /aye/, /ah/ consonants /b/, /m/, /w/ and with Max A able to combine /boo/, /bee/ and /blue/. Pt also demonstrates increased ability to transfer phrase learned with [employer] into spoken phrases with phonemic cue to produce phrase "How are you?" Pt continues to require extensive cues for overall volitional speech-language production. Skilled ST continues to be required to target the above mentioned goals.       Intensity: Minumum of 1-2 x/day, 30 to 90 minutes Frequency: 3 to 5 out of 7 days Duration/Length of Stay: 8/28 Treatment/Interventions: Cognitive remediation/compensation;Cueing hierarchy;Functional tasks;Patient/family education;Environmental controls;Internal/external aids;Multimodal communication approach;Speech/Language facilitation   Daily Session  Skilled Therapeutic Interventions:   Skilled treatment session #1 focused  on speech communication goals. SLP facilitated session by providing phonemic cue for pt to produce "how are you?" to staff members as he propelled himself down hallway. Pt required phonemic cue to produce intelligible speech (phrase). SLP further facilitated session by providing Max A with phrase completion to  combine consonant with vowel.   The ghost says _____(boo) I got stung by the _____(bee) The flag is read, white and _____(blue) Take it _____(easy) When we count we say four _____(five)  Pt able to answer [facility] question (which side do you have to pay special attention too?) related to safety with gestures or yes/no gestures. Pt able to navigate hallway without running into anything on right.   Skilled treatment session #2 focused on speech communication goals. SLP facilitated session by providing Mod A to count aloud pennies. Then presented number card (5). Pt unable to volitionally produce name of number or voicing. However when cued "when we count we say fou____" pt able to say "five." He then was able to progress to "four, five."  Pt benefits from phrase completion to produce speech. Pt again able to indicate which side he needed to pay attention and navigated hallway without running into anything on right.    Function:     Cognition Comprehension Comprehension assist level: Follows basic conversation/direction with no assist;Understands complex 90% of the time/cues 10% of the time  Expression   Expression assist level: Expresses basic 25 - 49% of the time/requires cueing 50 - 75% of the time. Uses single words/gestures.  Social Interaction Social Interaction assist level: Interacts appropriately 90% of the time - Needs monitoring or encouragement for participation or interaction.;Interacts appropriately with others with medication or extra time (anti-anxiety, antidepressant).  Problem Solving Problem solving assist level: Solves complex 90% of the time/cues < 10% of the time;Solves basic problems with no assist  Memory Memory assist level: More than reasonable amount of time   General    Pain    Therapy/Group: Individual Therapy  Kenyonna Micek 03/25/2018, 1:36 PM

## 2018-03-25 NOTE — Progress Notes (Signed)
Pt requested assistance to bathroom. Nurse recommended putting helmet on. Pt shook his head no. Nurse explained that helmet should be worn at all times when out the bed per order. Pt shook head no.

## 2018-03-25 NOTE — Plan of Care (Signed)
LBM 8/13

## 2018-03-26 ENCOUNTER — Inpatient Hospital Stay (HOSPITAL_COMMUNITY): Payer: BLUE CROSS/BLUE SHIELD | Admitting: Physical Therapy

## 2018-03-26 NOTE — Progress Notes (Addendum)
Physical Therapy Session Note  Patient Details  Name: Xavier Villegas MRN: [id number] Date of Birth: 1980/12/10  Today's Date: 03/26/2018 PT Individual Time: 0920-0959 PT Individual Time Calculation (min): 39 min   Short Term Goals: Week 4:  PT Short Term Goal 1 (Week 4): =LTGs due to ELOS  Skilled Therapeutic Interventions/Progress Updates:  Pt received in w/c & agreeable to tx, no c/o or behaviors demonstrating pain during session. Pt dons/doffs helmet with set up assist/supervision, is able to don L shoe with supervision & R shoe & AFO with min assist and assist to tie laces. Pt propels w/c room<>gym with L hemi technique and supervision. Pt completes sit<>stand and stand pivot transfers with steady assist and cuing for safety as pt is quick to move with decreased body/spatial awareness. Gait x 54 ft x 2 with hemiwalker and mod fading to min assist with cuing for increased step length RLE as pt attempts to slide foot as he fatigues, cuing for proper step width, and cuing for sequencing especially when turning to sit. Pt with minimal weight shifting R during gait but is also limited as he relies heavily on hemiwalker with LUE. Pt transferred onto mat table with min assist to assist RLE with sit>supine and to upright trunk with supine>sitting. While supine pt performs BLE bridging & RLE bridging with therapist providing assistance to maintain neutral alignment and support RLE. Pt transitioned to performing sit<>stand transfers at [facility] with LLE elevated on step and cuing for anterior/R weight shifting to promote forced use of RLE with pt requiring cuing for sequencing and to come to full upright posture, with mod assist. All tasks focused on RLE strengthening & NMR. At end of session pt left sitting in w/c in room with alarm belt donned & all needs within reach.  Therapy Documentation Precautions:  Precautions Precautions: Fall Precaution Comments: bone flap in L abdomen, no L side  lying Restrictions Weight Bearing Restrictions: No   See Function Navigator for Current Functional Status.   Therapy/Group: Individual Therapy  Waunita Schooner 03/26/2018, 11:17 AM

## 2018-03-26 NOTE — Plan of Care (Signed)
  Problem: RH BLADDER ELIMINATION Goal: RH STG MANAGE BLADDER WITH ASSISTANCE Description: STG Manage Bladder With min Assistance Outcome: Not Progressing;incontinence   

## 2018-03-26 NOTE — Progress Notes (Signed)
Subjective/Complaints: Patient able to place orders with yes no responses with dietary staff and severely a phasic however and has very limited verbal output such as hi and bye  ROS: limited due to language/communication   Objective: Vital Signs: Blood pressure 117/84, pulse 75, temperature 98 F (36.7 C), temperature source Oral, resp. rate 16, height 6' (1.829 m), weight 83.2 kg, SpO2 98 %. No results found. No results found for this or any previous visit (from the past 72 hour(s)).   Constitutional: No distress . Vital signs reviewed. HEENT: EOMI, oral membranes moist Neck: supple Cardiovascular: RRR without murmur. No JVD    Respiratory: CTA Bilaterally without wheezes or rales. Normal effort    GI: BS +, non-tender, non-distended  Neuro:  Expressive language deficits. Improving yes/no, initiates occasional word/phrase Motor: lUE/LLE: 5/5 proximal distal RUE/RLE; 0/5 proximal distal---has trace to 1+ prox right HF/HE  MAS: Left elbow and finger flexors 1/4,, pecs 1/4.  right ankle and plantar flexors tr to 1/4, toes up, DTR's remain 3+ Right Musc/Skel:  No right arm or leg pain with ROM Psych: pleasant Skin: wounds cdi   Assessment/Plan: 1. Functional deficits secondary to left MCA infarct with hemorrhagic transformation status post craniectomy which require 3+ hours per day of interdisciplinary therapy in a comprehensive inpatient rehab setting. Physiatrist is providing close team supervision and 24 hour management of active medical problems listed below. Physiatrist and rehab team continue to assess barriers to discharge/monitor patient progress toward functional and medical goals. FIM: Function - Bathing Bathing activity did not occur: Refused Position: Shower Body parts bathed by patient: Right arm, Chest, Abdomen, Front perineal area, Right upper leg, Left upper leg, Left lower leg, Right lower leg, Left arm Body parts bathed by helper: Buttocks Assist Level: Touching  or steadying assistance(Pt > 75%)  Function- Upper Body Dressing/Undressing What is the patient wearing?: Pull over shirt/dress Pull over shirt/dress - Perfomed by patient: Thread/unthread left sleeve, Put head through opening, Pull shirt over trunk, Thread/unthread right sleeve Pull over shirt/dress - Perfomed by helper: Thread/unthread right sleeve Assist Level: Supervision or verbal cues, Set up Set up : To obtain clothing/put away Function - Lower Body Dressing/Undressing What is the patient wearing?: Pants, Socks, Shoes Position: Wheelchair/chair at sink Pants- Performed by patient: Thread/unthread left pants leg, Pull pants up/down, Thread/unthread right pants leg Pants- Performed by helper: Thread/unthread left pants leg, Thread/unthread right pants leg, Pull pants up/down Non-skid slipper socks- Performed by patient: Don/doff right sock, Don/doff left sock Non-skid slipper socks- Performed by helper: Don/doff right sock, Don/doff left sock Socks - Performed by patient: Don/doff right sock, Don/doff left sock Socks - Performed by helper: Don/doff right sock, Don/doff left sock Shoes - Performed by patient: Don/doff right shoe, Don/doff left shoe Shoes - Performed by helper: Don/doff right shoe, Don/doff left shoe, Fasten left, Fasten right AFO - Performed by helper: Don/doff right AFO Assist for footwear: Supervision/touching assist Assist for lower body dressing: Touching or steadying assistance (Pt > 75%)  Function - Toileting Toileting activity did not occur: No continent bowel/bladder event Toileting steps completed by patient: Adjust clothing after toileting, Adjust clothing prior to toileting Toileting steps completed by helper: Adjust clothing prior to toileting Toileting Assistive Devices: Grab bar or rail Assist level: Two helpers  Function - Air cabin crew transfer activity did not occur: Safety/medical concerns Toilet transfer assistive device: Elevated  toilet seat/BSC over toilet Mechanical lift: Stedy Assist level to toilet: Maximal assist (Pt 25 - 49%/lift and lower) Assist level  from toilet: Maximal assist (Pt 25 - 49%/lift and lower)  Function - Chair/bed transfer Chair/bed transfer activity did not occur: Safety/medical concerns Chair/bed transfer method: Stand pivot Chair/bed transfer assist level: Touching or steadying assistance (Pt > 75%) Chair/bed transfer assistive device: Armrests Chair/bed transfer details: Verbal cues for precautions/safety  Function - Locomotion: Wheelchair Will patient use wheelchair at discharge?: Yes Type: Manual Max wheelchair distance: 100 ft Assist Level: Supervision or verbal cues Assist Level: Supervision or verbal cues Assist Level: Supervision or verbal cues Turns around,maneuvers to table,bed, and toilet,negotiates 3% grade,maneuvers on rugs and over doorsills: No Function - Locomotion: Ambulation Ambulation activity did not occur: Safety/medical concerns Assistive device: Walker-hemi Max distance: 54 ft Assist level: Moderate assist (Pt 50 - 74%) Assist level: Moderate assist (Pt 50 - 74%) Walk 50 feet with 2 turns activity did not occur: Safety/medical concerns Assist level: Moderate assist (Pt 50 - 74%) Walk 150 feet activity did not occur: Safety/medical concerns Walk 10 feet on uneven surfaces activity did not occur: Safety/medical concerns  Function - Comprehension Comprehension: Auditory Comprehension assist level: Follows basic conversation/direction with no assist, Understands complex 90% of the time/cues 10% of the time  Function - Expression Expression: Nonverbal Expression assistive device: Communication board Expression assist level: Expresses basic 25 - 49% of the time/requires cueing 50 - 75% of the time. Uses single words/gestures.  Function - Social Interaction Social Interaction assist level: Interacts appropriately 90% of the time - Needs monitoring or  encouragement for participation or interaction., Interacts appropriately with others with medication or extra time (anti-anxiety, antidepressant).  Function - Problem Solving Problem solving assist level: Solves complex 90% of the time/cues < 10% of the time, Solves basic problems with no assist  Function - Memory Memory assist level: More than reasonable amount of time Patient normally able to recall (first 3 days only): Current season, Location of own room, Staff names and faces, That he or she is in a hospital   Medical Problem List and Plan: 1.  Right hemiparesis, dysphagia, and aphasia secondary to large left MCA infarct with hemorrhagic transformation s/p hemicraniectomy.  Continue CIR, PT OT speech  Continue Helmet for safety OOB    2.  DVT Prophylaxis/Anticoagulation: Mechanical: Sequential compression devices, below knee Bilateral lower extremities 3. Pain Management: Tylenol as needed  -continue baclofen 10mg  qid  -continue splint/ROM 4. Mood: Consult LCSW  for evaluation and support 5. Neuropsych: This patient is not fully capable of making decisions on his own behalf. 6. Skin/Wound Care: Routine pressure relief measures.  Monitor wound for healing.  Maintain adequate nutritional status. 7. Fluids/Electrolytes/Nutrition: Monitor I's/O's.   Encourage nectar thick fluid intake.     8. Dysphagia: Continue dysphagia 3, nectar liquids    Recorded intake 840 mL on 03/25/2018 9. Leucocytosis: ?Progressive and felt to be reactive--ID recommends d/c antibiotic and monitor for signs of infection.   Mother with history of CLL.  WBCs 10.5 8/2--> 9.0 8/12 10. High homocysteine levels/low folate levels/boderline low B12: On high dose folic supplement, B6 and received B12 inj 7/14  . MRHFR--heterozygous /single mutation ID.  May cause abnormal clotting 11. GERD: Continue PPI.  12. Hyponatremia  Sodium 139 on 8/12 13. Neurogenic bladder:  -incontinence improving  -bladder training, this  is complicated by his aphasia     LOS (Days) [place] E Kirsteins 03/26/2018, 1:18 PM

## 2018-03-27 ENCOUNTER — Inpatient Hospital Stay (HOSPITAL_COMMUNITY): Payer: BLUE CROSS/BLUE SHIELD | Admitting: Occupational Therapy

## 2018-03-27 NOTE — Progress Notes (Signed)
Occupational Therapy Session Note  Patient Details  Name: Xavier Villegas MRN: [id number] Date of Birth: 01/20/1981  Today's Date: 03/27/2018 OT Group Time: 1100-1200 OT Group Time Calculation (min): 60 min   Skilled Therapeutic Interventions/Progress Updates:    Pt participated in therapeutic w/c level dance group with focus on UE/LE strengthening, activity tolerance, and social participation for carryover during self care tasks. Pt was guided through various dance-based exercises involving UB/LB and trunk. Emphasis placed on Rt NMR. With setup, pt incorporating active assist for UEs/LEs in beat to music. Played some of his favorite songs to elicit humming/singing. At end of session he was taken back to room by RN.   Therapy Documentation Precautions:  Precautions Precautions: Fall Precaution Comments: bone flap in L abdomen, no L side lying Restrictions Weight Bearing Restrictions: No Pain: No c/o pain during tx    ADL:      See Function Navigator for Current Functional Status.   Therapy/Group: Group Therapy  Meredith Mells A Elaina Cara 03/27/2018, 12:33 PM

## 2018-03-27 NOTE — Progress Notes (Signed)
Subjective/Complaints: Remains severely a phasic.  Does well with yes/ no simple questions  ROS: limited due to language/communication   Objective: Vital Signs: Blood pressure 115/67, pulse 72, temperature 97.7 F (36.5 C), temperature source Oral, resp. rate 16, height 6' (1.829 m), weight 83.2 kg, SpO2 100 %. No results found. No results found for this or any previous visit (from the past 72 hour(s)).   Constitutional: No distress . Vital signs reviewed. HEENT: EOMI, oral membranes moist Neck: supple Cardiovascular: RRR without murmur. No JVD    Respiratory: CTA Bilaterally without wheezes or rales. Normal effort    GI: BS +, non-tender, non-distended  Neuro:  Expressive language deficits. Improving yes/no, initiates occasional word/phrase Motor: lUE/LLE: 5/5 proximal distal RUE/RLE; 0/5 proximal distal---has trace to 1+ prox right HF/HE  MAS: Left elbow and finger flexors 1/4,, pecs 1/4.  right ankle and plantar flexors tr to 1/4, toes up, DTR's remain 3+ Right Musc/Skel:  No right arm or leg pain with ROM Psych: pleasant Skin: wounds cdi   Assessment/Plan: 1. Functional deficits secondary to left MCA infarct with hemorrhagic transformation status post craniectomy which require 3+ hours per day of interdisciplinary therapy in a comprehensive inpatient rehab setting. Physiatrist is providing close team supervision and 24 hour management of active medical problems listed below. Physiatrist and rehab team continue to assess barriers to discharge/monitor patient progress toward functional and medical goals. FIM: Function - Bathing Bathing activity did not occur: Refused Position: Shower Body parts bathed by patient: Right arm, Chest, Abdomen, Front perineal area, Right upper leg, Left upper leg, Left lower leg, Right lower leg, Left arm Body parts bathed by helper: Buttocks Assist Level: Touching or steadying assistance(Pt > 75%)  Function- Upper Body  Dressing/Undressing What is the patient wearing?: Pull over shirt/dress Pull over shirt/dress - Perfomed by patient: Thread/unthread left sleeve, Put head through opening, Pull shirt over trunk, Thread/unthread right sleeve Pull over shirt/dress - Perfomed by helper: Thread/unthread right sleeve Assist Level: Supervision or verbal cues, Set up Set up : To obtain clothing/put away Function - Lower Body Dressing/Undressing What is the patient wearing?: Pants, Socks, Shoes Position: Wheelchair/chair at sink Pants- Performed by patient: Thread/unthread left pants leg, Pull pants up/down, Thread/unthread right pants leg Pants- Performed by helper: Thread/unthread left pants leg, Thread/unthread right pants leg, Pull pants up/down Non-skid slipper socks- Performed by patient: Don/doff right sock, Don/doff left sock Non-skid slipper socks- Performed by helper: Don/doff right sock, Don/doff left sock Socks - Performed by patient: Don/doff right sock, Don/doff left sock Socks - Performed by helper: Don/doff right sock, Don/doff left sock Shoes - Performed by patient: Don/doff right shoe, Don/doff left shoe Shoes - Performed by helper: Don/doff right shoe, Don/doff left shoe, Fasten left, Fasten right AFO - Performed by helper: Don/doff right AFO Assist for footwear: Supervision/touching assist Assist for lower body dressing: Touching or steadying assistance (Pt > 75%)  Function - Toileting Toileting activity did not occur: No continent bowel/bladder event Toileting steps completed by patient: Adjust clothing prior to toileting, Adjust clothing after toileting Toileting steps completed by helper: Performs perineal hygiene, Adjust clothing after toileting Toileting Assistive Devices: Grab bar or rail Assist level: More than reasonable time  Function Midwife transfer activity did not occur: Safety/medical concerns Toilet transfer assistive device: Elevated toilet seat/BSC over  toilet, Mechanical lift Mechanical lift: Stedy Assist level to toilet: Maximal assist (Pt 25 - 49%/lift and lower) Assist level from toilet: Maximal assist (Pt 25 - 49%/lift and lower)  Function - Chair/bed transfer Chair/bed transfer activity did not occur: Safety/medical concerns Chair/bed transfer method: Stand pivot Chair/bed transfer assist level: Touching or steadying assistance (Pt > 75%) Chair/bed transfer assistive device: Armrests Chair/bed transfer details: Verbal cues for precautions/safety  Function - Locomotion: Wheelchair Will patient use wheelchair at discharge?: Yes Type: Manual Max wheelchair distance: 100 ft Assist Level: Supervision or verbal cues Assist Level: Supervision or verbal cues Assist Level: Supervision or verbal cues Turns around,maneuvers to table,bed, and toilet,negotiates 3% grade,maneuvers on rugs and over doorsills: No Function - Locomotion: Ambulation Ambulation activity did not occur: Safety/medical concerns Assistive device: Walker-hemi Max distance: 54 ft Assist level: Moderate assist (Pt 50 - 74%) Assist level: Moderate assist (Pt 50 - 74%) Walk 50 feet with 2 turns activity did not occur: Safety/medical concerns Assist level: Moderate assist (Pt 50 - 74%) Walk 150 feet activity did not occur: Safety/medical concerns Walk 10 feet on uneven surfaces activity did not occur: Safety/medical concerns  Function - Comprehension Comprehension: Auditory Comprehension assist level: Follows basic conversation/direction with no assist, Understands complex 90% of the time/cues 10% of the time  Function - Expression Expression: Nonverbal Expression assistive device: Communication board Expression assist level: Expresses basic 25 - 49% of the time/requires cueing 50 - 75% of the time. Uses single words/gestures.  Function - Social Interaction Social Interaction assist level: Interacts appropriately 90% of the time - Needs monitoring or encouragement  for participation or interaction., Interacts appropriately with others with medication or extra time (anti-anxiety, antidepressant).  Function - Problem Solving Problem solving assist level: Solves complex 90% of the time/cues < 10% of the time, Solves basic problems with no assist  Function - Memory Memory assist level: More than reasonable amount of time Patient normally able to recall (first 3 days only): Current season, Location of own room, Staff names and faces, That he or she is in a hospital   Medical Problem List and Plan: 1.  Right hemiparesis, dysphagia, and aphasia secondary to large left MCA infarct with hemorrhagic transformation s/p hemicraniectomy.  Continue CIR, PT OT speech  Continue Helmet for safety OOB    2.  DVT Prophylaxis/Anticoagulation: Mechanical: Sequential compression devices, below knee Bilateral lower extremities 3. Pain Management: Tylenol as needed  -continue baclofen 10mg  qid  -continue splint/ROM 4. Mood: Consult LCSW  for evaluation and support 5. Neuropsych: This patient is not fully capable of making decisions on his own behalf. 6. Skin/Wound Care: Routine pressure relief measures.  Monitor wound for healing.  Maintain adequate nutritional status. 7. Fluids/Electrolytes/Nutrition: Monitor I's/O's.   Encourage nectar thick fluid intake.     8. Dysphagia: Continue dysphagia 3, nectar liquids    Recorded intake 540 mL on 03/26/2018 9. Leucocytosis: ?Progressive and felt to be reactive--ID recommends d/c antibiotic and monitor for signs of infection.   Mother with history of CLL.  WBCs 10.5 8/2--> 9.0 8/12 10. High homocysteine levels/low folate levels/boderline low B12: On high dose folic supplement, B6 and received B12 inj 7/14  . MRHFR--heterozygous /single mutation ID.  May cause abnormal clotting 11. GERD: Continue PPI.  12. Hyponatremia  Sodium 139 on 8/12 13. Neurogenic bladder:  -incontinence improving  -bladder training, this is complicated  by his aphasia     LOS (Days) [place] E Sanah [place] 03/27/2018, 12:32 PM

## 2018-03-28 ENCOUNTER — Inpatient Hospital Stay (HOSPITAL_COMMUNITY): Payer: BLUE CROSS/BLUE SHIELD

## 2018-03-28 ENCOUNTER — Inpatient Hospital Stay (HOSPITAL_COMMUNITY): Payer: BLUE CROSS/BLUE SHIELD | Admitting: Occupational Therapy

## 2018-03-28 ENCOUNTER — Inpatient Hospital Stay (HOSPITAL_COMMUNITY): Payer: BLUE CROSS/BLUE SHIELD | Admitting: Physical Therapy

## 2018-03-28 NOTE — Progress Notes (Signed)
Subjective/Complaints: No new issues  ROS: Patient denies fever, rash, sore throat, blurred vision, nausea, vomiting, diarrhea, cough, shortness of breath or chest pain, joint or back pain, headache, or mood change.   Objective: Vital Signs: Blood pressure (!) 129/95, pulse 84, temperature 97.6 F (36.4 C), temperature source Oral, resp. rate 18, height 6' (1.829 m), weight 83.2 kg, SpO2 100 %. No results found. No results found for this or any previous visit (from the past 72 hour(s)).   Constitutional: No distress . Vital signs reviewed. HEENT: EOMI, oral membranes moist Neck: supple Cardiovascular: RRR without murmur. No JVD    Respiratory: CTA Bilaterally without wheezes or rales. Normal effort    GI: BS +, non-tender, non-distended  Neuro:  Expressive language deficits. Improving yes/no, initiates occasional word/phrase Motor: lUE/LLE: 5/5 proximal distal RUE/RLE; 0/5 proximal distal---has trace to 1+ prox right HF/HE  MAS: Left elbow and finger flexors 1/4,, pecs 1/4.  right ankle and plantar flexors tr to 1/4, toes up, DTR's remain 3+ Right Musc/Skel:  No right arm or leg pain with ROM Psych: pleasant Skin:cdi   Assessment/Plan: 1. Functional deficits secondary to left MCA infarct with hemorrhagic transformation status post craniectomy which require 3+ hours per day of interdisciplinary therapy in a comprehensive inpatient rehab setting. Physiatrist is providing close team supervision and 24 hour management of active medical problems listed below. Physiatrist and rehab team continue to assess barriers to discharge/monitor patient progress toward functional and medical goals. FIM: Function - Bathing Bathing activity did not occur: Refused Position: Shower Body parts bathed by patient: Right arm, Chest, Abdomen, Front perineal area, Right upper leg, Left upper leg, Left lower leg, Right lower leg, Left arm Body parts bathed by helper: Buttocks Assist Level: Touching or  steadying assistance(Pt > 75%)  Function- Upper Body Dressing/Undressing What is the patient wearing?: Pull over shirt/dress Pull over shirt/dress - Perfomed by patient: Thread/unthread left sleeve, Put head through opening, Pull shirt over trunk, Thread/unthread right sleeve Pull over shirt/dress - Perfomed by helper: Thread/unthread right sleeve Assist Level: Supervision or verbal cues, Set up Set up : To obtain clothing/put away Function - Lower Body Dressing/Undressing What is the patient wearing?: Pants, Socks, Shoes Position: Wheelchair/chair at sink Pants- Performed by patient: Thread/unthread left pants leg, Pull pants up/down, Thread/unthread right pants leg Pants- Performed by helper: Thread/unthread left pants leg, Thread/unthread right pants leg, Pull pants up/down Non-skid slipper socks- Performed by patient: Don/doff right sock, Don/doff left sock Non-skid slipper socks- Performed by helper: Don/doff right sock, Don/doff left sock Socks - Performed by patient: Don/doff right sock, Don/doff left sock Socks - Performed by helper: Don/doff right sock, Don/doff left sock Shoes - Performed by patient: Don/doff right shoe, Don/doff left shoe Shoes - Performed by helper: Don/doff right shoe, Don/doff left shoe, Fasten left, Fasten right AFO - Performed by helper: Don/doff right AFO Assist for footwear: Supervision/touching assist Assist for lower body dressing: Touching or steadying assistance (Pt > 75%)  Function - Toileting Toileting activity did not occur: No continent bowel/bladder event Toileting steps completed by patient: Adjust clothing prior to toileting, Adjust clothing after toileting Toileting steps completed by helper: Adjust clothing prior to toileting, Performs perineal hygiene, Adjust clothing after toileting Toileting Assistive Devices: Grab bar or rail Assist level: Two helpers  Function - Air cabin crew transfer activity did not occur: Safety/medical  concerns Toilet transfer assistive device: Elevated toilet seat/BSC over toilet, Mechanical lift Mechanical lift: Stedy Assist level to toilet: Maximal assist (Pt 25 -  49%/lift and lower) Assist level from toilet: Maximal assist (Pt 25 - 49%/lift and lower)  Function - Chair/bed transfer Chair/bed transfer activity did not occur: Safety/medical concerns Chair/bed transfer method: Stand pivot Chair/bed transfer assist level: Touching or steadying assistance (Pt > 75%) Chair/bed transfer assistive device: Armrests Chair/bed transfer details: Verbal cues for precautions/safety  Function - Locomotion: Wheelchair Will patient use wheelchair at discharge?: Yes Type: Manual Max wheelchair distance: 100 ft Assist Level: Supervision or verbal cues Assist Level: Supervision or verbal cues Assist Level: Supervision or verbal cues Turns around,maneuvers to table,bed, and toilet,negotiates 3% grade,maneuvers on rugs and over doorsills: No Function - Locomotion: Ambulation Ambulation activity did not occur: Safety/medical concerns Assistive device: Walker-hemi Max distance: 54 ft Assist level: Moderate assist (Pt 50 - 74%) Assist level: Moderate assist (Pt 50 - 74%) Walk 50 feet with 2 turns activity did not occur: Safety/medical concerns Assist level: Moderate assist (Pt 50 - 74%) Walk 150 feet activity did not occur: Safety/medical concerns Walk 10 feet on uneven surfaces activity did not occur: Safety/medical concerns  Function - Comprehension Comprehension: Auditory Comprehension assist level: Follows basic conversation/direction with no assist  Function - Expression Expression: Verbal Expression assistive device: Communication board Expression assist level: Expresses basic 25 - 49% of the time/requires cueing 50 - 75% of the time. Uses single words/gestures.  Function - Social Interaction Social Interaction assist level: Interacts appropriately 75 - 89% of the time - Needs  redirection for appropriate language or to initiate interaction.  Function - Problem Solving Problem solving assist level: Solves basic problems with no assist  Function - Memory Memory assist level: Recognizes or recalls 75 - 89% of the time/requires cueing 10 - 24% of the time Patient normally able to recall (first 3 days only): Current season, Location of own room, Staff names and faces, That he or she is in a hospital   Medical Problem List and Plan: 1.  Right hemiparesis, dysphagia, and aphasia secondary to large left MCA infarct with hemorrhagic transformation s/p hemicraniectomy.  Continue CIR, PT OT speech  Continue Helmet for safety OOB    2.  DVT Prophylaxis/Anticoagulation: Mechanical: Sequential compression devices, below knee Bilateral lower extremities 3. Pain Management: Tylenol as needed  -continue baclofen 10mg  qid  -continue splint/ROM 4. Mood: Consult LCSW  for evaluation and support 5. Neuropsych: This patient is not fully capable of making decisions on his own behalf. 6. Skin/Wound Care: Routine pressure relief measures.  Monitor wound for healing.  Maintain adequate nutritional status. 7. Fluids/Electrolytes/Nutrition: Monitor I's/O's.   Encourage nectar thick fluid intake.    -recheck labs this week 8. Dysphagia: Continue dysphagia 3, nectar liquids    Push po 9. Leucocytosis: ?Progressive and felt to be reactive--ID recommends d/c antibiotic and monitor for signs of infection.   Mother with history of CLL.  WBCs 10.5 8/2--> 9.0 8/12 10. High homocysteine levels/low folate levels/boderline low B12: On high dose folic supplement, B6 and received B12 inj 7/14  . MRHFR--heterozygous /single mutation ID.  May cause abnormal clotting 11. GERD: Continue PPI.  12. Hyponatremia  Sodium 139 on 8/12 13. Neurogenic bladder:  -incontinence improving  -bladder training, this is complicated by his aphasia     LOS (Days) [place] 03/28/2018, 9:57 AM

## 2018-03-28 NOTE — Progress Notes (Addendum)
Occupational Therapy Session Note  Patient Details  Name: Xavier Villegas MRN: [id number] Date of Birth: Jan 25, 1981  Today's Date: 03/28/2018 OT Individual Time: 1300-1410 OT Individual Time Calculation (min): 70 min   Short Term Goals: Week 3:  OT Short Term Goal 1 (Week 3): Pt will initiate UB hemi dressing strategies during 1 ADL session  OT Short Term Goal 2 (Week 3): Pt will complete 4/4 components of donning shirt with close supervision for balance  OT Short Term Goal 3 (Week 3): Pt will complete 1/3 components of donning pants while sitting unsupported with close supervision   OT Short Term Goal 4 (Week 3): Pt will complete toilet transfer with Mod A   Skilled Therapeutic Interventions/Progress Updates:    Pt greeted in w/c. Fatigued however amenable to tx. Worked on functional transfers, dynamic balance, and ADL retraining by participation in BADLs at shower level. Wearing helmet, he ambulated with hemi walker and Min-Mod A into bathroom with manual facilitation for Lt weight shifts. He bathed sit<stand with vcs for hemi techniques and Mod A for balance during perihgyiene.Therapist manually facilitated weightbearing of R UE on grab bar when standing. Able to maintain seated figure 4 to wash feet with steady assist and vcs for balance awareness. He dressed sit<stand from TTB with assist for Rt shoe with AFO and Rt Teds. After ambulating back to w/c, pt declined participation in grooming/oral care tasks. For remainder of tx worked on gentle AAROM with R UE, education emphasis placed on joint protection strategies during self ROM. Pt still reports pain in R UE which improves with movement. He shook his head when asked if he wanted RN to provide medicine for this pain. At end of session pt was left in w/c with all needs within reach and safety belt fastened.   Therapy Documentation Precautions:  Precautions Precautions: Fall Precaution Comments: bone flap in L abdomen, no L side  lying Restrictions Weight Bearing Restrictions: No ADL:    See Function Navigator for Current Functional Status.  Therapy/Group: Individual Therapy  Farhana Fellows A [place] Hacking 03/28/2018, 4:32 PM

## 2018-03-28 NOTE — Progress Notes (Signed)
Physical Therapy Session Note  Patient Details  Name: Xavier Villegas MRN: [id number] Date of Birth: 1980/12/02  Today's Date: 03/28/2018 PT Individual Time: 1000-1100 PT Individual Time Calculation (min): 60 min   Short Term Goals: Week 4:  PT Short Term Goal 1 (Week 4): =LTGs due to ELOS  Skilled Therapeutic Interventions/Progress Updates:    Pt gestures to no pain prior to session start. Pt propels w/c 75 ft using L hemi technique from room> treatment gym with supervision for R visual scanning to avoid obstacles. Pt ambulates x2 trials of 100 feet with HW and therapist providing min A for facilitation of weight shifting progressing to CGA with Pt able to facilitate wt shift and R LE advancement. Pt demonstrates difficulty sequencing turns with therapist providing multimodal cueing for proper placement of HW. Pt performs x2 trials of cone weaving with HW and therapist providing min A for both trials. Pt requires multimodal cues for proper placement and sequencing with HW when performing tight turns. Pt demonstrates x1 LOB episode requiring steadying assist and loss of gait sequencing with HW in distracting environments; however, easy to redirect to task. Pt performs x4 steps with ability to recall proper sequencing using L HR and therapist providing min A for weight shifting . Pt performs backwards stepping at top using L HR to facilitate posterior musculature strengthening during functional mobility task. Pt then ambulates 140 feet using HW and min A for weight shifting. Pt left with SLP therapist for next session with all needs met.   Therapy Documentation Precautions:  Precautions Precautions: Fall Precaution Comments: bone flap in L abdomen, no L side lying Restrictions Weight Bearing Restrictions: No   See Function Navigator for Current Functional Status.   Therapy/Group: Individual Therapy  Floreen Comber 03/28/2018, 4:42 PM

## 2018-03-28 NOTE — Progress Notes (Signed)
Speech Language Pathology Daily Session Note  Patient Details  Name: Xavier Villegas MRN: [id number] Date of Birth: August 29, 1980  Today's Date: 03/28/2018 SLP Individual Time: 1100-1200 / 900-930 SLP Individual Time Calculation (min): 60 min and 30 min  Short Term Goals: Week 4: SLP Short Term Goal 1 (Week 4): Pt will utilize multimodal communication to express wants/needs with supervision cues in 9 out of 10 opportunities.  SLP Short Term Goal 2 (Week 4): Using multimodal forms of communications pt will accurately answer questions related to safety awareness with 90% accuracy.  SLP Short Term Goal 3 (Week 4): Given Mod A cues, pt will produce vowels in 8 out of 10 opportunities.  SLP Short Term Goal 4 (Week 4): Given Mod A cues, pt will combine consonant with vowel in 8 out of 10 opportunities.  SLP Short Term Goal 5 (Week 4): Pt will complete semi-complex problem solving tasks with supervision cues.  SLP Short Term Goal 6 (Week 4): Given Max A mutlimodal cues, pt will name common objects in 5 out of 10 opportunities.   Skilled Therapeutic Interventions: 1# Skilled ST services focused on speech skills. Pt's father present for treatment. Pt communicated wants/needs ( tie shoes and place right leg brace) with gestures and in response to yes/no questions with supervision A question cues. SLP facilitated combining consonant and vowels while counting 1-10, given number cards, required max A verbal model fading to mod A verbal cues with continual visual aid of number cards or counting with fingers. Pt demonstrated articulation of number names with 75% accuracy with continued phonemic errors, mainly initial consonants. Pt demonstrates awareness of verbal errors and ability to correct errors with max-mod A verbal model.  SLP facilitated communication while placing meal orders, given yes/no question pt required min-supervision A verbal cues. Pt was left in room with call bell within reach and chair  alarm set. Recommend to continue skilled ST services.   2# Skilled ST services focused on speech skills. SLP facilitated naming of common objects utilizing [employer] toolkit, pt named "ball" and "plate" in functional phrase with max A verbal cues fade to mod A verbal cues in phrase completion tasks. Pt was unable to name cup, dice, penny and mirror with functional phrase, likely due to complexity of phonemic combinations. SLP facilitated stimulation of constants and CVC combinations, pt produced "hay", "pan" and "bee" with functional phrase given max A verbal cues in phrase completion tasks. Pt required max-mod A verbal cues to break verbal perseveration. SLP facilitated spelling of simple/functional words utilizing alphabet board, pt required initial written aid fading to min A verbal cues. Pt was left in room with nurse present and communicated need to use restroom upon presentation of yes/no question. Recommend to continue skilled ST services.      Function:  Eating Eating                 Cognition Comprehension Comprehension assist level: Follows basic conversation/direction with no assist  Expression Expression assistive device: Communication board Expression assist level: Expresses basic 25 - 49% of the time/requires cueing 50 - 75% of the time. Uses single words/gestures.  Social Interaction Social Interaction assist level: Interacts appropriately 75 - 89% of the time - Needs redirection for appropriate language or to initiate interaction.  Problem Solving Problem solving assist level: Solves basic problems with no assist  Memory Memory assist level: Recognizes or recalls 75 - 89% of the time/requires cueing 10 - 24% of the time    Pain Pain  Assessment Pain Score: 0-No pain  Therapy/Group: Individual Therapy  Sharmel Ballantine  [facility] 03/28/2018, 12:09 PM

## 2018-03-29 ENCOUNTER — Inpatient Hospital Stay (HOSPITAL_COMMUNITY): Payer: BLUE CROSS/BLUE SHIELD | Admitting: Occupational Therapy

## 2018-03-29 ENCOUNTER — Inpatient Hospital Stay (HOSPITAL_COMMUNITY): Payer: BLUE CROSS/BLUE SHIELD | Admitting: Physical Therapy

## 2018-03-29 ENCOUNTER — Inpatient Hospital Stay: Payer: BLUE CROSS/BLUE SHIELD | Admitting: Internal Medicine

## 2018-03-29 ENCOUNTER — Inpatient Hospital Stay (HOSPITAL_COMMUNITY): Payer: BLUE CROSS/BLUE SHIELD

## 2018-03-29 NOTE — Progress Notes (Signed)
Physical Therapy Session Note  Patient Details  Name: Xavier Villegas MRN: [id number] Date of Birth: June 05, 1981  Today's Date: 03/29/2018 PT Individual Time: 8350-7573 PT Individual Time Calculation (min): 25 min   Short Term Goals: Week 4:  PT Short Term Goal 1 (Week 4): =LTGs due to ELOS  Skilled Therapeutic Interventions/Progress Updates:   Pt in w/c and agreeable to therapy, denies pain. Session focused on w/c mobility and parts management, needed total assist to don R shoe/AFO. Pt self-propelled w/c around unit w/ supervision using L hemi technique. Negotiated cones both forward and backwards x2 reps. Brief rest break in between each bout. Verbal cues for technique at the start w/ w/c technique, no cues needed at last bout. Demonstrated moving leg rests in and out, pt needed min assist at first to find lever to push. Returned to room and ended session in w/c, call bell within reach and all needs met. Quick release belt donned.   Therapy Documentation Precautions:  Precautions Precautions: Fall Precaution Comments: bone flap in L abdomen, no L side lying Restrictions Weight Bearing Restrictions: No Vital Signs: Therapy Vitals Temp: 97.6 F (36.4 C) Temp Source: Oral Pulse Rate: 70 Resp: 16 BP: 103/63 Patient Position (if appropriate): Lying Oxygen Therapy SpO2: 99 % O2 Device: Room Air  See Function Navigator for Current Functional Status.   Therapy/Group: Individual Therapy  Zelma Snead K Arnette 03/29/2018, 9:02 AM

## 2018-03-29 NOTE — Progress Notes (Signed)
Updated primary RN of discussions from team conference.  Dr. Naaman Plummer to discontinue sleep chart order since patient sleeping without difficulty.  He is also considering decreasing scheduled pain medications since pain appears to be controlled.  Brita Romp, RN

## 2018-03-29 NOTE — Progress Notes (Signed)
Speech Language Pathology Daily Session Note  Patient Details  Name: Xavier Villegas MRN: [id number] Date of Birth: 03-02-1981  Today's Date: 03/29/2018 SLP Individual Time: 0900-1000 SLP Individual Time Calculation (min): 60 min  Short Term Goals: Week 4: SLP Short Term Goal 1 (Week 4): Pt will utilize multimodal communication to express wants/needs with supervision cues in 9 out of 10 opportunities.  SLP Short Term Goal 2 (Week 4): Using multimodal forms of communications pt will accurately answer questions related to safety awareness with 90% accuracy.  SLP Short Term Goal 3 (Week 4): Given Mod A cues, pt will produce vowels in 8 out of 10 opportunities.  SLP Short Term Goal 4 (Week 4): Given Mod A cues, pt will combine consonant with vowel in 8 out of 10 opportunities.  SLP Short Term Goal 5 (Week 4): Pt will complete semi-complex problem solving tasks with supervision cues.  SLP Short Term Goal 6 (Week 4): Given Max A mutlimodal cues, pt will name common objects in 5 out of 10 opportunities.   Skilled Therapeutic Interventions: Skilled ST services focused on speech skills. Pt required min-supervision A verbal cues for right attention during navigation of WC in hallway.SLP facilitated production of CV combination /b/ and /w/ with 60-70% accuracy given mod A phrase completion cues and given CV /m/ and /p/ combinations only produced consonant /p/ with 10% accuracy given max A cues, with 0% accuracy with consonant /m/. Pt demonstrated production of vowels in functional phrase completion with min-mod A verbal cues. Pt communicated safety awareness, gesturing for leg rest and safety belt, responding to questions in yes/no format. Pt was left in room with call bell within reach, chair alaram set and father present.ST reccomends to continue skilled ST services.     Function:  Eating Eating                 Cognition Comprehension Comprehension assist level: Understands basic 90% of  the time/cues < 10% of the time;Understands basic 75 - 89% of the time/ requires cueing 10 - 24% of the time  Expression Expression assistive device: Communication board Expression assist level: Expresses basic 25 - 49% of the time/requires cueing 50 - 75% of the time. Uses single words/gestures.;Expresses basic 50 - 74% of the time/requires cueing 25 - 49% of the time. Needs to repeat parts of sentences.  Social Interaction Social Interaction assist level: Interacts appropriately 90% of the time - Needs monitoring or encouragement for participation or interaction.  Problem Solving Problem solving assist level: Solves basic problems with no assist  Memory Memory assist level: Recognizes or recalls 75 - 89% of the time/requires cueing 10 - 24% of the time    Pain Pain Assessment Pain Score: 0-No pain  Therapy/Group: Individual Therapy  Xavier Villegas  [facility] 03/29/2018, 2:09 PM

## 2018-03-29 NOTE — Progress Notes (Signed)
Physical Therapy Session Note  Patient Details  Name: Xavier Villegas MRN: [id number] Date of Birth: 27-May-1981  Today's Date: 03/29/2018 PT Individual Time: 1100-1200 PT Individual Time Calculation (min): 60 min   Short Term Goals: Week 4:  PT Short Term Goal 1 (Week 4): =LTGs due to ELOS  Skilled Therapeutic Interventions/Progress Updates:    Pt gestures to no pain prior to session start. Pt propels w/c from room> gym 75 feet using L hemi technique and supervision for R visual scanning to avoid obstacles. Pt ambulates 238 feet with HW and CGA fading to min A for weight shifting during fatigue onset. Pt performs ambulatory transfer with HW and CGA from mat> another treatment mat 10 feet away. Pt performs multiple trials of squat pivot transfers progressing to stand pivot transfers until fatigue from mat<>w/c with no UE assist to promote R LE weight bearing and strengthening. Pt requires multimodal cues for weight shifting, LE foot placement, maintaining equal WB through B LE's, and maintaining head hip relationship for efficiency with little carry over between trials requiring reinforcement. Pt demonstrates occasional LOB episodes when attempting to perform pivot without moving his LE's requiring steadying assist. Therapist attempted to stagger Pt's feet prior to subsequent trials with Pt unable to demonstrate carry over with staggering his feet independently prior to transfers. Pt performs ambulation with HW while kicking a block with both LE's to promote weight shifting during dual task until fatigue. Pt demonstrates left lateral lean with heavy UE reliance on HW and therapist progresses Pt to performing same task with no AD providing min fade to mod assist. Pt fatigues quickly without AD requiring prolonged seated rest break. Pt then ambulates 50 feet towards room with quad cane, min A, to decrease left lateral leaning with LRAD. Pt fatigues quickly requiring PT to propel w/c rest of way back  to room. Pt left sitting with chair alarm activated, tray table and call bell in reach and father present.  Therapy Documentation Precautions:  Precautions Precautions: Fall Precaution Comments: bone flap in L abdomen, no L side lying Restrictions Weight Bearing Restrictions: No   See Function Navigator for Current Functional Status.   Therapy/Group: Individual Therapy  Floreen Comber 03/29/2018, 12:10 PM

## 2018-03-29 NOTE — Progress Notes (Signed)
Occupational Therapy Session Note  Patient Details  Name: Xavier Villegas MRN: [id number] Date of Birth: April 19, 1981  Today's Date: 03/29/2018 OT Individual Time: 1300-1358 OT Individual Time Calculation (min): 58 min    Short Term Goals: Week 3:  OT Short Term Goal 1 (Week 3): Pt will initiate UB hemi dressing strategies during 1 ADL session  OT Short Term Goal 2 (Week 3): Pt will complete 4/4 components of donning shirt with close supervision for balance  OT Short Term Goal 3 (Week 3): Pt will complete 1/3 components of donning pants while sitting unsupported with close supervision   OT Short Term Goal 4 (Week 3): Pt will complete toilet transfer with Mod A   Skilled Therapeutic Interventions/Progress Updates:    Upon entering the room, pt seated in wheelchair with father present for family education and observation. Pt gesturing to bathroom and ambulating with quad cane 15' into bathroom with min A for balance and min verbal cues for weight shift as needed. Pt performed clothing hygiene with min A for standing balance before returning to wheelchair in same manner as above. OT educated and demonstrated pt transfer from wheelchair <> toilet with his father demonstrating task. Caregiver checked off to assist pt to toilet with stand pivot from wheelchair. OT discussed bathroom set up with caregiver and demonstrated transfer onto TTB in tub shower. Increased safety risk due to pt unable to wear AFO and footwear for transfer in and out of walk in shower. Pt returning demonstration onto TTB with quad cane at min A overall and pt able to get B LEs over tub ledge with increased time. His father agreed that TTB would be safest option for home environment. Pt returning to room at end of session by propelling wheelchair utilizing hemiplegic technique. Alarm belt donned and call bell within reach upon exiting the room.   Therapy Documentation Precautions:  Precautions Precautions: Fall Precaution  Comments: bone flap in L abdomen, no L side lying Restrictions Weight Bearing Restrictions: No General:   Vital Signs: Therapy Vitals Temp: 97.9 F (36.6 C) Temp Source: Oral Pulse Rate: 67 Resp: 18 BP: 122/89 Patient Position (if appropriate): Sitting Oxygen Therapy SpO2: 100 % O2 Device: Room Air Pain: Pain Assessment Pain Score: 0-No pain  See Function Navigator for Current Functional Status.   Therapy/Group: Individual Therapy  [name] 03/29/2018, 4:16 PM

## 2018-03-29 NOTE — Progress Notes (Signed)
Subjective/Complaints: Up in chair. Denies pain    Objective: Vital Signs: Blood pressure 103/63, pulse 70, temperature 97.6 F (36.4 C), temperature source Oral, resp. rate 16, height 6' (1.829 m), weight 83.2 kg, SpO2 99 %. No results found. No results found for this or any previous visit (from the past 72 hour(s)).   Constitutional: No distress . Vital signs reviewed. HEENT: EOMI, oral membranes moist Neck: supple Cardiovascular: RRR without murmur. No JVD    Respiratory: CTA Bilaterally without wheezes or rales. Normal effort    GI: BS +, non-tender, non-distended     Neuro:  Expressive language deficits. Improving yes/no, initiates occasional word/phrase Motor: lUE/LLE: 5/5 proximal distal RUE/RLE; 0/5 proximal distal---has trace to 1+ prox right HF/HE  MAS: Left elbow and finger flexors 1/4,, pecs 1/4. Wrist,elbow  right ankle and plantar flexors tr to 1/4, toes up, DTR's remain 3+ Right Musc/Skel:  No right arm or leg pain with ROM Psych: pleasant Skin:cdi   Assessment/Plan: 1. Functional deficits secondary to left MCA infarct with hemorrhagic transformation status post craniectomy which require 3+ hours per day of interdisciplinary therapy in a comprehensive inpatient rehab setting. Physiatrist is providing close team supervision and 24 hour management of active medical problems listed below. Physiatrist and rehab team continue to assess barriers to discharge/monitor patient progress toward functional and medical goals. FIM: Function - Bathing Bathing activity did not occur: Refused Position: Shower Body parts bathed by patient: Right arm, Chest, Abdomen, Front perineal area, Right upper leg, Left upper leg, Left lower leg, Right lower leg, Left arm, Buttocks Body parts bathed by helper: Back Assist Level: Touching or steadying assistance(Pt > 75%)  Function- Upper Body Dressing/Undressing What is the patient wearing?: Pull over shirt/dress Pull over shirt/dress -  Perfomed by patient: Thread/unthread left sleeve, Put head through opening, Pull shirt over trunk, Thread/unthread right sleeve Pull over shirt/dress - Perfomed by helper: Thread/unthread right sleeve Assist Level: Supervision or verbal cues, Set up Set up : To obtain clothing/put away Function - Lower Body Dressing/Undressing What is the patient wearing?: Pants, Socks, Shoes, AFO, Ted Hose Position: Other (comment)(sitting on TTB) Pants- Performed by patient: Thread/unthread left pants leg, Pull pants up/down, Thread/unthread right pants leg Pants- Performed by helper: Thread/unthread left pants leg, Thread/unthread right pants leg, Pull pants up/down Non-skid slipper socks- Performed by patient: Don/doff right sock, Don/doff left sock Non-skid slipper socks- Performed by helper: Don/doff right sock, Don/doff left sock Socks - Performed by patient: Don/doff left sock Socks - Performed by helper: Don/doff right sock, Don/doff left sock Shoes - Performed by patient: Don/doff left shoe Shoes - Performed by helper: Don/doff right shoe AFO - Performed by patient: Don/doff right AFO AFO - Performed by helper: Don/doff right AFO TED Hose - Performed by helper: Don/doff right TED hose Assist for footwear: Partial/moderate assist Assist for lower body dressing: Touching or steadying assistance (Pt > 75%)  Function - Toileting Toileting activity did not occur: No continent bowel/bladder event Toileting steps completed by patient: Adjust clothing prior to toileting, Adjust clothing after toileting Toileting steps completed by helper: Adjust clothing prior to toileting, Performs perineal hygiene, Adjust clothing after toileting Toileting Assistive Devices: Grab bar or rail Assist level: Two helpers  Function - Air cabin crew transfer activity did not occur: Risk analyst transfer assistive device: Elevated toilet seat/BSC over toilet Mechanical lift: Stedy Assist level to toilet:  Maximal assist (Pt 25 - 49%/lift and lower) Assist level from toilet: Maximal assist (Pt 25 - 49%/lift and lower)  Function - Chair/bed transfer Chair/bed transfer activity did not occur: Safety/medical concerns Chair/bed transfer method: Ambulatory Chair/bed transfer assist level: Touching or steadying assistance (Pt > 75%) Chair/bed transfer assistive device: Other(HW) Chair/bed transfer details: Tactile cues for weight shifting, Verbal cues for sequencing, Verbal cues for technique, Visual cues for safe use of DME/AE  Function - Locomotion: Wheelchair Will patient use wheelchair at discharge?: Yes Type: Manual Max wheelchair distance: 150' Assist Level: Supervision or verbal cues Assist Level: Supervision or verbal cues Assist Level: Supervision or verbal cues Turns around,maneuvers to table,bed, and toilet,negotiates 3% grade,maneuvers on rugs and over doorsills: Yes Function - Locomotion: Ambulation Ambulation activity did not occur: Safety/medical concerns Assistive device: Walker-hemi Max distance: 140 Assist level: Touching or steadying assistance (Pt > 75%) Assist level: Touching or steadying assistance (Pt > 75%) Walk 50 feet with 2 turns activity did not occur: Safety/medical concerns Assist level: Touching or steadying assistance (Pt > 75%) Walk 150 feet activity did not occur: Safety/medical concerns Walk 10 feet on uneven surfaces activity did not occur: Safety/medical concerns  Function - Comprehension Comprehension: Auditory Comprehension assist level: Understands basic 75 - 89% of the time/ requires cueing 10 - 24% of the time  Function - Expression Expression: Verbal Expression assistive device: Communication board Expression assist level: Expresses basic 25 - 49% of the time/requires cueing 50 - 75% of the time. Uses single words/gestures.  Function - Social Interaction Social Interaction assist level: Interacts appropriately 25 - 49% of time - Needs frequent  redirection.  Function - Problem Solving Problem solving assist level: Solves basic 25 - 49% of the time - needs direction more than half the time to initiate, plan or complete simple activities  Function - Memory Memory assist level: Recognizes or recalls 25 - 49% of the time/requires cueing 50 - 75% of the time Patient normally able to recall (first 3 days only): Current season, Location of own room, Staff names and faces, That he or she is in a hospital   Medical Problem List and Plan: 1.  Right hemiparesis, dysphagia, and aphasia secondary to large left MCA infarct with hemorrhagic transformation s/p hemicraniectomy.  Continue CIR, PT OT speech  Continue Helmet for safety OOB   -team conference today 2.  DVT Prophylaxis/Anticoagulation: Mechanical: Sequential compression devices, below knee Bilateral lower extremities 3. Pain Management: Tylenol as needed  -continue baclofen 10mg  qid  -continue splint/ROM 4. Mood: Consult LCSW  for evaluation and support 5. Neuropsych: This patient is not fully capable of making decisions on his own behalf. 6. Skin/Wound Care: Routine pressure relief measures.  Monitor wound for healing.  Maintain adequate nutritional status. 7. Fluids/Electrolytes/Nutrition: Monitor I's/O's.   Encourage nectar thick fluid intake.    -recheck labs later this week 8. Dysphagia: Continue dysphagia 3, nectar liquids    Push po 9. Leucocytosis: ?Progressive and felt to be reactive--ID recommends d/c antibiotic and monitor for signs of infection.   Mother with history of CLL.  WBCs 10.5 8/2--> 9.0 8/12 10. High homocysteine levels/low folate levels/boderline low B12: On high dose folic supplement, B6 and received B12 inj 7/14  . MRHFR--heterozygous /single mutation ID.  May cause abnormal clotting 11. GERD: Continue PPI.  12. Hyponatremia  Sodium 139 on 8/12 13. Neurogenic bladder:  -incontinence improving  -bladder training, this is complicated by his aphasia      LOS (Days) Holden 03/29/2018, 9:05 AM

## 2018-03-30 ENCOUNTER — Inpatient Hospital Stay (HOSPITAL_COMMUNITY): Payer: BLUE CROSS/BLUE SHIELD | Admitting: Occupational Therapy

## 2018-03-30 ENCOUNTER — Inpatient Hospital Stay (HOSPITAL_COMMUNITY): Payer: BLUE CROSS/BLUE SHIELD | Admitting: Physical Therapy

## 2018-03-30 ENCOUNTER — Inpatient Hospital Stay (HOSPITAL_COMMUNITY): Payer: BLUE CROSS/BLUE SHIELD | Admitting: Speech Pathology

## 2018-03-30 MED ORDER — HYDROCODONE-ACETAMINOPHEN 5-325 MG PO TABS: 1.0000 | ORAL_TABLET | Freq: Four times a day (QID) | ORAL | Status: DC | PRN

## 2018-03-30 NOTE — Progress Notes (Signed)
Occupational Therapy Weekly Progress Note  Patient Details  Name: Xavier Villegas MRN: [id number] Date of Birth: 03/26/1981  Beginning of progress report period: March 21, 2018 End of progress report period: March 30, 2018  Today's Date: 03/30/2018 OT Individual Time: 1045-1200 OT Individual Time Calculation (min): 75 min    Patient has met 4 of 4 short term goals. Pt making steady progress towards Occupational therapy goals. Functional transfers with quad cane requiring min - mod A depending on level of fatigue. Pt performing hemiplegic dressing techniques with overall min A for standing balance. R UE continues to be 0/5 but is not painful with movement this week. Family education has been initiated for upcoming discharge.  Patient continues to demonstrate the following deficits: muscle weakness, decreased cardiorespiratoy endurance, abnormal tone, ataxia, decreased coordination and decreased motor planning, decreased attention to right, decreased attention, decreased awareness, decreased problem solving and decreased safety awareness and decreased standing balance, hemiplegia and decreased balance strategies and therefore will continue to benefit from skilled OT intervention to enhance overall performance with BADL and iADL.  Patient progressing toward long term goals..  Continue plan of care.  OT Short Term Goals Week 3:  OT Short Term Goal 1 (Week 3): Pt will initiate UB hemi dressing strategies during 1 ADL session  OT Short Term Goal 1 - Progress (Week 3): Met OT Short Term Goal 2 (Week 3): Pt will complete 4/4 components of donning shirt with close supervision for balance  OT Short Term Goal 2 - Progress (Week 3): Met OT Short Term Goal 3 (Week 3): Pt will complete 1/3 components of donning pants while sitting unsupported with close supervision   OT Short Term Goal 3 - Progress (Week 3): Met OT Short Term Goal 4 (Week 3): Pt will complete toilet transfer with Mod A  OT  Short Term Goal 4 - Progress (Week 3): Met Week 4:  OT Short Term Goal 1 (Week 4): STGs=LTGs secondary to upcoming discharge  Skilled Therapeutic Interventions/Progress Updates:    Upon entering the room, pt seated in wheelchair awaiting therapist with no signs, symptoms, or c/o pain this session. Pt is agreeable to OT intervention. Friend present and observing session. Pt propelled wheelchair to ADL apartment with supervision 150' with hemiplegic technique utilized. Pt ambulating on carpeted surface with quad cane and overall min A with min verbal cues for weight shift with LE advancement. Pt demonstrating functional transfers into and out of recliner chair, 27 in high standard bed, and low sofa with overall min A for balance. Pt required min verbal cues for proper technique and R foot placement. Floor transfer completed in ADL apartment from couch with min verbal cues for technique and min A for balance with task. OT performed PROM to R UE in all planes of movement with no c/o pain from pt. Pt propelled self back to room at end of session with call bell and all needed items within reach. Pt verbalized response of, " Hey, How are you?" in hallway to staff member on the way back to room. Belt alarm activated.   Therapy Documentation Precautions:  Precautions Precautions: Fall Precaution Comments: bone flap in L abdomen, no L side lying Restrictions Weight Bearing Restrictions: No Other Treatments:    See Function Navigator for Current Functional Status.   Therapy/Group: Individual Therapy  Xavier Villegas 03/30/2018, 4:52 PM

## 2018-03-30 NOTE — Patient Care Conference (Signed)
Inpatient RehabilitationTeam Conference and Plan of Care Update Date: 03/29/2018   Time: 2:00 PM    Patient Name: Xavier Villegas      Medical Record Number: [id number]  Date of Birth: 1981/05/20 Sex: Male         Room/Bed: 4W12C/4W12C-01 Payor Info: Payor: [place] / Plan: [employer] OTHER / Product Type: *No Product type* /    Admitting Diagnosis: CVA  Admit Date/Time:  03/01/2018  6:01 PM Admission Comments: No comment available   Primary Diagnosis:  <principal problem not specified> Principal Problem: <principal problem not specified>  Patient Active Problem List   Diagnosis Date Noted  . Hyponatremia   . Cerebral infarction due to embolism of left middle cerebral artery (HCC) s/p thrombectomy 03/01/2018  . Carotid artery dissection ([place]) Left 03/01/2018  . Acute ischemic right MCA stroke ([place]) 03/01/2018  . Right hemiparesis ([place])   . Dysphagia, post-stroke   . Global aphasia   . Leukocytosis   . Folic acid deficiency 96/29/5284  . B12 deficiency 02/23/2018  . Hyperhomocysteinemia ([place]) 02/23/2018  . Smoker     Expected Discharge Date: Expected Discharge Date: 04/06/18  Team Members Present: Physician leading conference: Dr. Alger Simons Social Worker Present: Alfonse Alpers, LCSW Nurse Present: Rayetta Pigg, RN PT Present: Dwyane Dee, PT OT Present: Benay Pillow, OT SLP Present: Charolett Bumpers, SLP PPS Coordinator present : Daiva Nakayama, RN, CRRN     Current Status/Progress Goal Weekly Team Focus  Medical   Patient showing gradual progress from a language and communication standpoint.  Tone seems to be under better control but remains an issue  Optimize communication and functional  See above   Bowel/Bladder   Continent of bowel, continent/incontinent of bladder, LBM 03/27/18  Remain continent of bowel/bladder with min assist  timed toileting program   Swallow/Nutrition/ Hydration             ADL's   Min-Mod A bathroom transfers  ambulating with hemi walker, Min A LB self care, supervision UB self care  Supervision-Min A   Rt NMR, cognitive remediation, balance, functional transfers, pt/family education    Mobility   min A for gait 238 feet with HW & 50 ft with quad cane; close supervision for transfers; min A for x4 6 inch steps using L HR  min guard/assist overall  R attention, NMR, balance, functional mobility, cog remediation   Communication   Mod-Max A  Mod A  vowels, CV combinations (bilabial constants B/W), melodic intonation therapy common phrase, spelling on alphabet board   Safety/Cognition/ Behavioral Observations  Min A  Min A, Sup A  Semi-complex problem solving without large language component, error awareness, right attention, selective, safety   Pain   No complain of pain  ,2  Assess and treat pain q shift and as needed   Skin   Bone flap to left ABD, surgical incision to the head  Skin to remain free of infection/breakdown with min assist  Assess skin q shift and as needed    Rehab Goals Patient on target to meet rehab goals: Yes Rehab Goals Revised: none *See Care Plan and progress notes for long and short-term goals.     Barriers to Discharge  Current Status/Progress Possible Resolutions Date Resolved   Physician    Medical stability        Hypertonicity and language deficits, see medical progress notes      Nursing  PT                    OT                  SLP                SW                Discharge Planning/Teaching Needs:  Pt to return to his home with his parents and aunt to provide the recommended supervision/assist needed.  Family education has begun and will continue 03-31-18  and 04-01-18  Team Discussion:  Pt is doing well medically, using baclofen for spasms still.  Pt with some incontinence, but timed toileting is helping.  Nighttime is hard.  Dr. Naaman Plummer discontinued sleep chart and will decrease some of pt's pain medications.  Pt walked 230' with hemi  walker with steady assist and 40' with quad cane.  Pt can do 4 steps with 1 rail which is what he needs to do at home.  Pt is supervision for transfers and is on track to meet goals.  OT taught father how to transfer pt from w/c to commode and shower without a device and they were safe together.  Father felt comfortable.  Pt is doing better with spontaneous speech and is counting 1-10 and using phrases.  Working on Astr[employer].    Revisions to Treatment Plan:  none    Continued Need for Acute Rehabilitation Level of Care: The patient requires daily medical management by a physician with specialized training in physical medicine and rehabilitation for the following conditions: Daily direction of a multidisciplinary physical rehabilitation program to ensure safe treatment while eliciting the highest outcome that is of practical value to the patient.: Yes Daily medical management of patient stability for increased activity during participation in an intensive rehabilitation regime.: Yes Daily analysis of laboratory values and/or radiology reports with any subsequent need for medication adjustment of medical intervention for : Neurological problems  Keriana Sarsfield, [name] 03/30/2018, 11:59 PM

## 2018-03-30 NOTE — Progress Notes (Signed)
Physical Therapy Session Note  Patient Details  Name: Xavier Villegas MRN: [id number] Date of Birth: 18-Nov-1980  Today's Date: 03/30/2018 PT Individual Time: 0900-1000 PT Individual Time Calculation (min): 60 min   Short Term Goals: Week 3:  PT Short Term Goal 1 (Week 3): Pt will ambulate 23' with LRAD and min assist PT Short Term Goal 1 - Progress (Week 3): Met PT Short Term Goal 2 (Week 3): Pt will negotiate 4 steps with LHR and max assist PT Short Term Goal 2 - Progress (Week 3): Met PT Short Term Goal 3 (Week 3): Pt will propel w/c in community environment with min assist and mod verbal cues for R attention  PT Short Term Goal 3 - Progress (Week 3): Met  Skilled Therapeutic Interventions/Progress Updates:    Pt gestures to no pain prior to session start. Pt propels w/c from room> gym 100 feet using L hemi technique and supervision for R visual scanning to avoid obstacles. Pt consistently performs STS and stand pivot transfers using quad cane with close supervision fading to intermittent CGA for steadying. Pt ambulates 160 feet with quad cane and CGA for weight shifting 2/2 fatigue onset. Pt performs x2 trials of cone weaving using quad cane and CGA with therapist verbalizing correct sequencing and VC's to maintain proximity to quad cane when performing turns. Pt requires reinforcement of sequencing when ambulating in distracting environment. Pt ambulates 50 feet from gym> apartment using quad cane and CGA performing ambulatory transfer to bed. Pt performs sitting EOB<> supine with supervision and verbalizes understanding that he cannot perform side lying or rolling on L side due to skull placement. PT instructs Pt on proper bed mobility sequencing during supine> sitting EOB for energy conservation. Pt demonstrates x1 LOB episode when performing STS from EOB requiring steadying A from therapist due to poor sequencing with cane and Pt not equally WB through B LE's. Pt then ambulates 75  feet with quad cane and CGA stepping over x2 poles with proper sequencing. Pt then propels w/c 50 feet with B LE's working on [employer] with reciprocal stepping and endurance/ strengthening. PT propels Pt rest of way and positions Pt in chair with tray table and call bell in reach, chair alarm activated and all needs met.  Therapy Documentation Precautions:  Precautions Precautions: Fall Precaution Comments: bone flap in L abdomen, no L side lying Restrictions Weight Bearing Restrictions: No   See Function Navigator for Current Functional Status.   Therapy/Group: Individual Therapy  Floreen Comber 03/30/2018, 1:01 PM

## 2018-03-30 NOTE — Progress Notes (Signed)
Speech Language Pathology Weekly Progress and Session Note  Patient Details  Name: Xavier Villegas MRN: [id number] Date of Birth: Dec 20, 1980  Beginning of progress report period: March 24, 2018 End of progress report period: March 30, 2018  Today's Date: 03/30/2018 SLP Individual Time: 1305-1405 SLP Individual Time Calculation (min): 60 min  Short Term Goals: Week 4: SLP Short Term Goal 1 (Week 4): Pt will utilize multimodal communication to express wants/needs with supervision cues in 9 out of 10 opportunities.  SLP Short Term Goal 1 - Progress (Week 4): Progressing toward goal SLP Short Term Goal 2 (Week 4): Using multimodal forms of communications pt will accurately answer questions related to safety awareness with 90% accuracy.  SLP Short Term Goal 2 - Progress (Week 4): Progressing toward goal SLP Short Term Goal 3 (Week 4): Given Mod A cues, pt will produce vowels in 8 out of 10 opportunities.  SLP Short Term Goal 3 - Progress (Week 4): Progressing toward goal SLP Short Term Goal 4 (Week 4): Given Mod A cues, pt will combine consonant with vowel in 8 out of 10 opportunities.  SLP Short Term Goal 4 - Progress (Week 4): Progressing toward goal SLP Short Term Goal 5 (Week 4): Pt will complete semi-complex problem solving tasks with supervision cues.  SLP Short Term Goal 5 - Progress (Week 4): Progressing toward goal SLP Short Term Goal 6 (Week 4): Given Max A mutlimodal cues, pt will name common objects in 5 out of 10 opportunities.  SLP Short Term Goal 6 - Progress (Week 4): Progressing toward goal    New Short Term Goals: Week 5: SLP Short Term Goal 1 (Week 5): Pt will utilize multimodal communication to express wants/needs with supervision cues in 9 out of 10 opportunities.  SLP Short Term Goal 2 (Week 5): Using multimodal forms of communications pt will accurately answer questions related to safety awareness with 90% accuracy.  SLP Short Term Goal 3 (Week 5): Given Mod A  cues, pt will produce vowels in 8 out of 10 opportunities.  SLP Short Term Goal 4 (Week 5): Given Mod A cues, pt will combine consonant with vowel in 8 out of 10 opportunities.  SLP Short Term Goal 5 (Week 5): Pt will complete semi-complex problem solving tasks with supervision cues.  SLP Short Term Goal 6 (Week 5): Given Max A mutlimodal cues, pt will name common objects in 5 out of 10 opportunities.   Weekly Progress Updates: Pt continues to make steady progress towards goals as evidenced by ~ 60% ability to produce sounds within above goals. Pt has also increased his number of different vowels/consonants produced. Pt has also increased his spontaneous use of phrases. Although pt has increased his ability, he continues to experience significant difficulty with overall expressive abilities. Therefore skilled ST continues to be indicated at this time.      Intensity: Minumum of 1-2 x/day, 30 to 90 minutes Frequency: 3 to 5 out of 7 days Duration/Length of Stay: 8/28 Treatment/Interventions: Cognitive remediation/compensation;Cueing hierarchy;Functional tasks;Patient/family education;Environmental controls;Internal/external aids;Multimodal communication approach;Speech/Language facilitation   Daily Session  Skilled Therapeutic Interventions: Skilled treatment session focused on cognition goals. SLP facilitated session by providing supervision to [place] A cues to complete complex game of checkers in moderately distracting environment. Pt with cues for complex thought process but was independent with attention and scanning to right of midline. Pt also spoke to staff and other pt's as they walked by. Pt with increased spontaneous verbalizations during session and several therapists report  that has asked "how are you?" spontaneously. Pt was returned to room, left upright in wheelchair, lap alarm on and all needs within reach. Continue per current plan of care.      Function:   Eating Eating      Eating Assist Level: Set up assist for   Eating Set Up Assist For: Opening containers       Cognition Comprehension Comprehension assist level: Follows basic conversation/direction with no assist  Expression Expression assistive device: Communication board Expression assist level: Expresses basic 50 - 74% of the time/requires cueing 25 - 49% of the time. Needs to repeat parts of sentences.  Social Interaction Social Interaction assist level: Interacts appropriately with others with medication or extra time (anti-anxiety, antidepressant).  Problem Solving Problem solving assist level: Solves complex 90% of the time/cues < 10% of the time;Solves basic problems with no assist  Memory Memory assist level: More than reasonable amount of time   General    Pain    Therapy/Group: Individual Therapy  Adesuwa Osgood 03/30/2018, 2:12 PM

## 2018-03-30 NOTE — Progress Notes (Signed)
Subjective/Complaints: Feeling well. Slept without issue  ROS: limited due to language/communication     Objective: Vital Signs: Blood pressure 105/67, pulse 84, temperature 97.8 F (36.6 C), temperature source Oral, resp. rate 16, height 6' (1.829 m), weight 83.2 kg, SpO2 99 %. No results found. No results found for this or any previous visit (from the past 72 hour(s)).   Constitutional: No distress . Vital signs reviewed. HEENT: EOMI, oral membranes moist Neck: supple Cardiovascular: RRR without murmur. No JVD    Respiratory: CTA Bilaterally without wheezes or rales. Normal effort    GI: BS +, non-tender, non-distended    Neuro:  Expressive language deficits. Improving yes/no, initiates occasional word/phrase Motor: lUE/LLE: 5/5 proximal distal RUE/RLE; 0/5 proximal distal---1 to 1+ prox right HF/HE  MAS: Left elbow and finger flexors 1/4,, pecs 1/4. Wrist,elbow  right ankle and plantar flexors tr to 1/4, toes up, DTR's remain 3+ Right Musc/Skel:  No right arm or leg pain with ROM Psych: pleasant Skin:cdi   Assessment/Plan: 1. Functional deficits secondary to left MCA infarct with hemorrhagic transformation status post craniectomy which require 3+ hours per day of interdisciplinary therapy in a comprehensive inpatient rehab setting. Physiatrist is providing close team supervision and 24 hour management of active medical problems listed below. Physiatrist and rehab team continue to assess barriers to discharge/monitor patient progress toward functional and medical goals. FIM: Function - Bathing Bathing activity did not occur: Refused Position: Shower Body parts bathed by patient: Right arm, Chest, Abdomen, Front perineal area, Right upper leg, Left upper leg, Left lower leg, Right lower leg, Left arm, Buttocks Body parts bathed by helper: Back Assist Level: Touching or steadying assistance(Pt > 75%)  Function- Upper Body Dressing/Undressing What is the patient wearing?:  Pull over shirt/dress Pull over shirt/dress - Perfomed by patient: Thread/unthread left sleeve, Put head through opening, Pull shirt over trunk, Thread/unthread right sleeve Pull over shirt/dress - Perfomed by helper: Thread/unthread right sleeve Assist Level: Supervision or verbal cues, Set up Set up : To obtain clothing/put away Function - Lower Body Dressing/Undressing What is the patient wearing?: Pants, Socks, Shoes, AFO, Ted Hose Position: Other (comment)(sitting on TTB) Pants- Performed by patient: Thread/unthread left pants leg, Pull pants up/down, Thread/unthread right pants leg Pants- Performed by helper: Thread/unthread left pants leg, Thread/unthread right pants leg, Pull pants up/down Non-skid slipper socks- Performed by patient: Don/doff right sock, Don/doff left sock Non-skid slipper socks- Performed by helper: Don/doff right sock, Don/doff left sock Socks - Performed by patient: Don/doff left sock Socks - Performed by helper: Don/doff right sock, Don/doff left sock Shoes - Performed by patient: Don/doff left shoe Shoes - Performed by helper: Don/doff right shoe AFO - Performed by patient: Don/doff right AFO AFO - Performed by helper: Don/doff right AFO TED Hose - Performed by helper: Don/doff right TED hose Assist for footwear: Partial/moderate assist Assist for lower body dressing: Touching or steadying assistance (Pt > 75%)  Function - Toileting Toileting activity did not occur: No continent bowel/bladder event Toileting steps completed by patient: Adjust clothing prior to toileting, Adjust clothing after toileting, Performs perineal hygiene Toileting steps completed by helper: Adjust clothing prior to toileting, Performs perineal hygiene, Adjust clothing after toileting Toileting Assistive Devices: Grab bar or rail Assist level: Touching or steadying assistance (Pt.75%)  Function - Air cabin crew transfer activity did not occur: Refused Toilet transfer  assistive device: Elevated toilet seat/BSC over toilet, Cane Mechanical lift: Stedy Assist level to toilet: Touching or steadying assistance (Pt > 75%) Assist  level from toilet: Touching or steadying assistance (Pt > 75%)  Function - Chair/bed transfer Chair/bed transfer activity did not occur: Safety/medical concerns Chair/bed transfer method: Stand pivot, Squat pivot, Ambulatory Chair/bed transfer assist level: Touching or steadying assistance (Pt > 75%) Chair/bed transfer assistive device: Other(HW) Chair/bed transfer details: Tactile cues for weight shifting, Verbal cues for sequencing, Verbal cues for technique, Visual cues for safe use of DME/AE, Verbal cues for safe use of DME/AE  Function - Locomotion: Wheelchair Will patient use wheelchair at discharge?: Yes Type: Manual Max wheelchair distance: 75 Assist Level: Supervision or verbal cues Assist Level: Supervision or verbal cues Assist Level: Supervision or verbal cues Turns around,maneuvers to table,bed, and toilet,negotiates 3% grade,maneuvers on rugs and over doorsills: No Function - Locomotion: Ambulation Ambulation activity did not occur: Safety/medical concerns Assistive device: Walker-hemi Max distance: 238 Assist level: Touching or steadying assistance (Pt > 75%) Assist level: Touching or steadying assistance (Pt > 75%) Walk 50 feet with 2 turns activity did not occur: Safety/medical concerns Assist level: Touching or steadying assistance (Pt > 75%) Walk 150 feet activity did not occur: Safety/medical concerns Assist level: Touching or steadying assistance (Pt > 75%) Walk 10 feet on uneven surfaces activity did not occur: Safety/medical concerns  Function - Comprehension Comprehension: Auditory Comprehension assist level: Understands basic 90% of the time/cues < 10% of the time(nods and gestures for communications and make needs known)  Function - Expression Expression: Verbal(nods and also uses gestures for  communication) Expression assistive device: Communication board Expression assist level: Expresses basic 50 - 74% of the time/requires cueing 25 - 49% of the time. Needs to repeat parts of sentences.  Function - Social Interaction Social Interaction assist level: Interacts appropriately 90% of the time - Needs monitoring or encouragement for participation or interaction.  Function - Problem Solving Problem solving assist level: Solves basic 75 - 89% of the time/requires cueing 10 - 24% of the time  Function - Memory Memory assist level: Recognizes or recalls 75 - 89% of the time/requires cueing 10 - 24% of the time Patient normally able to recall (first 3 days only): Current season, Location of own room, Staff names and faces, That he or she is in a hospital   Medical Problem List and Plan: 1.  Right hemiparesis, dysphagia, and aphasia secondary to large left MCA infarct with hemorrhagic transformation s/p hemicraniectomy.  Continue CIR, PT OT speech  Continue Helmet for safety OOB    2.  DVT Prophylaxis/Anticoagulation: Mechanical: Sequential compression devices, below knee Bilateral lower extremities 3. Pain Management: Tylenol as needed  -continue baclofen 10mg  qid  -continue splint/ROM 4. Mood: Consult LCSW  for evaluation and support 5. Neuropsych: This patient is not fully capable of making decisions on his own behalf. 6. Skin/Wound Care: Routine pressure relief measures.  Monitor wound for healing.  Maintain adequate nutritional status. 7. Fluids/Electrolytes/Nutrition: Monitor I's/O's.   Encourage nectar thick fluid intake.    -recheck labs later this week 8. Dysphagia: Continue dysphagia 3, nectar liquids    Push po 9. Leucocytosis: ?Progressive and felt to be reactive--ID recommends d/c antibiotic and monitor for signs of infection.   Mother with history of CLL.  WBCs 10.5 8/2--> 9.0 8/12  -recheck cbc tomrrow 10. High homocysteine levels/low folate levels/boderline low  B12: On high dose folic supplement, B6 and received B12 inj 7/14  . MRHFR--heterozygous /single mutation ID.  May cause abnormal clotting 11. GERD: Continue PPI.  12. Hyponatremia  Sodium 139 on 8/12 13. Neurogenic bladder:  -  incontinence improving  -bladder training, this is complicated by his aphasia     LOS (Days) [place] 03/30/2018, 9:32 AM

## 2018-03-31 ENCOUNTER — Inpatient Hospital Stay (HOSPITAL_COMMUNITY): Payer: BLUE CROSS/BLUE SHIELD | Admitting: Speech Pathology

## 2018-03-31 ENCOUNTER — Ambulatory Visit (HOSPITAL_COMMUNITY): Payer: BLUE CROSS/BLUE SHIELD | Admitting: Physical Therapy

## 2018-03-31 ENCOUNTER — Inpatient Hospital Stay (HOSPITAL_COMMUNITY): Payer: BLUE CROSS/BLUE SHIELD | Admitting: Occupational Therapy

## 2018-03-31 MED ORDER — DOXYCYCLINE HYCLATE 100 MG PO TABS
100.0000 mg | ORAL_TABLET | Freq: Two times a day (BID) | ORAL | Status: DC
  Administered 2018-03-31 – 2018-04-05 (×11): 100 mg via ORAL
  Filled 2018-03-31 (×12): qty 1

## 2018-03-31 NOTE — Progress Notes (Signed)
RN was call in the room by OT, A cyst that is red hard at touch and painful as per pt. Is noticed at the left buttocks. Also as per PT. the right ankle looks more swollen today than two days ago. Pam Love PA was notified.Keep assessing pt. Closely.

## 2018-03-31 NOTE — Progress Notes (Signed)
Occupational Therapy Session Note  Patient Details  Name: Xavier Villegas MRN: [id number] Date of Birth: Sep 01, 1980  Today's Date: 03/31/2018 OT Individual Time: 9357-0177 OT Individual Time Calculation (min): 73 min    Short Term Goals: Week 4:  OT Short Term Goal 1 (Week 4): STGs=LTGs secondary to upcoming discharge  Skilled Therapeutic Interventions/Progress Updates:    Upon entering the room, pt seated in wheelchair and immediately gesturing to bathroom door. OT assisted pt via wheelchair into bathroom and transfer onto toilet with min A. Pt incontinent of bladder and soiling clothing as well. OT removed all clothing and pt unable to void further. Pt transferred stand pivot from toilet >wheelchair onto TTB with mod A for lifting for safety. Pt remained seated on TTB during shower with min verbal cues for sequencing. Pt verbalized "water" and pointing down for therapist to make water adjustment! Pt returning to wheelchair and donning clothing items while seated at sink. Sit >stand with min A for LB clothing management. Pt utilized hemiplegic dressing technique for dressing tasks without verbal cues needed to do so. Pt remained seated in wheelchair with trial of alarm belt off between sessions. Call bell and all needs within reach.   Therapy Documentation Precautions:  Precautions Precautions: Fall Precaution Comments: bone flap in L abdomen, no L side lying Restrictions Weight Bearing Restrictions: No General:   Vital Signs: Therapy Vitals Temp: 97.7 F (36.5 C) Temp Source: Oral Pulse Rate: 64 Resp: 16 BP: 119/84 Patient Position (if appropriate): Lying Oxygen Therapy SpO2: 100 % O2 Device: Room Air  See Function Navigator for Current Functional Status.   Therapy/Group: Individual Therapy  [name] 03/31/2018, 9:32 AM

## 2018-03-31 NOTE — Progress Notes (Signed)
Left buttock with noted to have indurated partially drained cyst. Will order warm moist compresses to promote drainage and add doxycycline for prophylaxis.

## 2018-03-31 NOTE — Progress Notes (Signed)
Subjective/Complaints: Up with therapies. No new issues. Likes AFO  ROS: Patient denies fever, rash, sore throat, blurred vision, nausea, vomiting, diarrhea, cough, shortness of breath or chest pain, joint or back pain, headache, or mood change.     Objective: Vital Signs: Blood pressure 117/81, pulse 74, temperature 98.6 F (37 C), temperature source Oral, resp. rate 18, height 6' (1.829 m), weight 83.2 kg, SpO2 100 %. No results found. No results found for this or any previous visit (from the past 72 hour(s)).   Constitutional: No distress . Vital signs reviewed. HEENT: EOMI, oral membranes moist Neck: supple Cardiovascular: RRR without murmur. No JVD    Respiratory: CTA Bilaterally without wheezes or rales. Normal effort    GI: BS +, non-tender, non-distended  Neuro:  Expressive language deficits. Improving yes/no, initiates occasional word/phrase Motor: lUE/LLE: 5/5 proximal distal RUE 0/5-  1+   right HF/HE, 0/5 distaly MAS: Left elbow and finger flexors tr to 1/4,, pecs tr/4. Wrist,elbow  right ankle and plantar flexors tr to 1/4, toes up, DTR's remain 3+ Right Musc/Skel:  No right arm or leg pain with ROM Psych: pleasant  Skin:cdi   Assessment/Plan: 1. Functional deficits secondary to left MCA infarct with hemorrhagic transformation status post craniectomy which require 3+ hours per day of interdisciplinary therapy in a comprehensive inpatient rehab setting. Physiatrist is providing close team supervision and 24 hour management of active medical problems listed below. Physiatrist and rehab team continue to assess barriers to discharge/monitor patient progress toward functional and medical goals. FIM: Function - Bathing Bathing activity did not occur: Refused Position: Shower Body parts bathed by patient: Right arm, Chest, Abdomen, Front perineal area, Right upper leg, Left upper leg, Left lower leg, Right lower leg, Left arm Body parts bathed by helper: Back,  Buttocks Assist Level: Touching or steadying assistance(Pt > 75%)  Function- Upper Body Dressing/Undressing What is the patient wearing?: Pull over shirt/dress Pull over shirt/dress - Perfomed by patient: Thread/unthread left sleeve, Put head through opening, Pull shirt over trunk, Thread/unthread right sleeve Pull over shirt/dress - Perfomed by helper: Thread/unthread right sleeve Assist Level: Supervision or verbal cues Set up : To obtain clothing/put away Function - Lower Body Dressing/Undressing What is the patient wearing?: Pants, Socks, Shoes, AFO, Ted Hose Position: Wheelchair/chair at [employer]- Performed by patient: Thread/unthread left pants leg, Pull pants up/down, Thread/unthread right pants leg Pants- Performed by helper: Thread/unthread left pants leg, Thread/unthread right pants leg, Pull pants up/down Non-skid slipper socks- Performed by patient: Don/doff right sock, Don/doff left sock Non-skid slipper socks- Performed by helper: Don/doff right sock, Don/doff left sock Socks - Performed by patient: Don/doff right sock, Don/doff left sock Socks - Performed by helper: Don/doff right sock, Don/doff left sock Shoes - Performed by patient: Don/doff left shoe Shoes - Performed by helper: Don/doff right shoe AFO - Performed by patient: Don/doff right AFO AFO - Performed by helper: Don/doff right AFO TED Hose - Performed by helper: Don/doff right TED hose Assist for footwear: Partial/moderate assist Assist for lower body dressing: Touching or steadying assistance (Pt > 75%)  Function - Toileting Toileting activity did not occur: No continent bowel/bladder event Toileting steps completed by patient: Adjust clothing prior to toileting, Performs perineal hygiene, Adjust clothing after toileting Toileting steps completed by helper: Performs perineal hygiene, Adjust clothing after toileting Toileting Assistive Devices: Grab bar or rail Assist level: Touching or steadying assistance  (Pt.75%)  Function - Air cabin crew transfer activity did not occur: Refused Toilet transfer assistive device:  Elevated toilet seat/BSC over toilet, Cane Mechanical lift: Stedy Assist level to toilet: Touching or steadying assistance (Pt > 75%) Assist level from toilet: Touching or steadying assistance (Pt > 75%)  Function - Chair/bed transfer Chair/bed transfer activity did not occur: Safety/medical concerns Chair/bed transfer method: Stand pivot Chair/bed transfer assist level: Supervision or verbal cues Chair/bed transfer assistive device: Armrests, Cane Chair/bed transfer details: Verbal cues for sequencing, Verbal cues for technique, Verbal cues for safe use of DME/AE  Function - Locomotion: Wheelchair Will patient use wheelchair at discharge?: Yes Type: Manual Max wheelchair distance: 75 Assist Level: Supervision or verbal cues Assist Level: Supervision or verbal cues Assist Level: Supervision or verbal cues Turns around,maneuvers to table,bed, and toilet,negotiates 3% grade,maneuvers on rugs and over doorsills: No Function - Locomotion: Ambulation Ambulation activity did not occur: Safety/medical concerns Assistive device: Cane-quad Max distance: 50 Assist level: Touching or steadying assistance (Pt > 75%) Assist level: Touching or steadying assistance (Pt > 75%) Walk 50 feet with 2 turns activity did not occur: Safety/medical concerns Assist level: Touching or steadying assistance (Pt > 75%) Walk 150 feet activity did not occur: Safety/medical concerns Assist level: Touching or steadying assistance (Pt > 75%) Walk 10 feet on uneven surfaces activity did not occur: Safety/medical concerns  Function - Comprehension Comprehension: Auditory Comprehension assist level: Follows basic conversation/direction with extra time/assistive device  Function - Expression Expression: Verbal Expression assistive device: Communication board Expression assist level: Expresses  basic 50 - 74% of the time/requires cueing 25 - 49% of the time. Needs to repeat parts of sentences.  Function - Social Interaction Social Interaction assist level: Interacts appropriately 90% of the time - Needs monitoring or encouragement for participation or interaction.  Function - Problem Solving Problem solving assist level: Solves basic 90% of the time/requires cueing < 10% of the time  Function - Memory Memory assist level: More than reasonable amount of time Patient normally able to recall (first 3 days only): Current season, Location of own room, Staff names and faces, That he or she is in a hospital   Medical Problem List and Plan: 1.  Right hemiparesis, dysphagia, and aphasia secondary to large left MCA infarct with hemorrhagic transformation s/p hemicraniectomy.  Continue CIR, PT OT speech  Continue Helmet for safety OOB    2.  DVT Prophylaxis/Anticoagulation: Mechanical: Sequential compression devices, below knee Bilateral lower extremities 3. Pain Management: Tylenol as needed  -continue baclofen 10mg  qid  -continue splint/ROM 4. Mood: Consult LCSW  for evaluation and support 5. Neuropsych: This patient is not fully capable of making decisions on his own behalf. 6. Skin/Wound Care: Routine pressure relief measures.  Monitor wound for healing.  Maintain adequate nutritional status. 7. Fluids/Electrolytes/Nutrition: Monitor I's/O's.   Encourage nectar thick fluid intake.    -recheck labs tomorrow 8. Dysphagia: Continue dysphagia 3, nectar liquids    Push po 9. Leucocytosis: ?Progressive and felt to be reactive--ID recommends d/c antibiotic and monitor for signs of infection.   Mother with history of CLL.  WBCs 10.5 8/2--> 9.0 8/12  -recheck cbc tomorrow 10. High homocysteine levels/low folate levels/boderline low B12: On high dose folic supplement, B6 and received B12 inj 7/14  . MRHFR--heterozygous /single mutation ID.  May cause abnormal clotting 11. GERD: Continue  PPI.  12. Hyponatremia  Sodium 139 on 8/12 13. Neurogenic bladder:  -incontinence improving  -bladder training, this is complicated by his aphasia     LOS (Days) [place] T  Naaman Plummer 03/31/2018, 3:09 PM

## 2018-03-31 NOTE — Progress Notes (Signed)
Physical Therapy Session Note  Patient Details  Name: Xavier Villegas MRN: [id number] Date of Birth: 1981/05/26  Today's Date: 03/31/2018 PT Individual Time:  - 1100- 1200= 60 min      Short Term Goals: Week 4:  PT Short Term Goal 1 (Week 4): =LTGs due to ELOS  Skilled Therapeutic Interventions/Progress Updates:    Pt gestures to no pain prior to session start. Pt propels w/c using L hemi technique from room>gym for 75 ft with supervision for R visual scanning to avoid obstacles. Today's session focused on family education for transfers, ambulation, and fall recovery. Therapist verbalizes and demonstrates correct techniques to assist patient with performing transfers safely to level & compliant surfaces and car transfer with family able to demonstrate and verbalize correct sequence with safety awareness. Therapist demonstrates correct guarding technique and hand placement for ambulating with Pt and verbalizes concerns in regards to safety with Pt father due to recent L knee surgery; however, father understands and requests to continue hands on practice. Pt performs x3 trials of gait with quad cane and CGA for 50 feet with rest break in between trials. Pt then performs x1 trial of fall recovery onto mat with supervision to lower to floor and mod multimodal cues and mod boosting to recover from floor> mat with Pt demonstrating dec carry over from yesterdays fall recovery attempt with OT. Pt performs floor recovery with R AFO removed for inc mobility. PT notes observable edema to R ankle and notified PA and nursing staff. PT propels Pt back to room for time management and returns him to sitting in chair with tray table and call bell in reach, all needs met, and family present.    Therapy Documentation Precautions:  Precautions Precautions: Fall Precaution Comments: bone flap in L abdomen, no L side lying Restrictions Weight Bearing Restrictions: No   See Function Navigator for Current  Functional Status.   Therapy/Group: Individual Therapy  Floreen Comber 03/31/2018, 2:00 PM

## 2018-03-31 NOTE — Progress Notes (Signed)
Speech Language Pathology Daily Session Note  Patient Details  Name: Xavier Villegas MRN: [id number] Date of Birth: 1981/02/26  Today's Date: 03/31/2018   Skilled treatment session #1 SLP Individual Time: 0930-1030 SLP Individual Time Calculation (min): 60 min   Skilled treatment session #2 SLP Individual Time: 2774-1287 SLP Individual Time Calculation (min): 45 min  Short Term Goals: Week 5: SLP Short Term Goal 1 (Week 5): Pt will utilize multimodal communication to express wants/needs with supervision cues in 9 out of 10 opportunities.  SLP Short Term Goal 2 (Week 5): Using multimodal forms of communications pt will accurately answer questions related to safety awareness with 90% accuracy.  SLP Short Term Goal 3 (Week 5): Given Mod A cues, pt will produce vowels in 8 out of 10 opportunities.  SLP Short Term Goal 4 (Week 5): Given Mod A cues, pt will combine consonant with vowel in 8 out of 10 opportunities.  SLP Short Term Goal 5 (Week 5): Pt will complete semi-complex problem solving tasks with supervision cues.  SLP Short Term Goal 6 (Week 5): Given Max A mutlimodal cues, pt will name common objects in 5 out of 10 opportunities.   Skilled Therapeutic Interventions:  Skilled treatment session #1 focused on communication and education with pt/family on increasing use of verbal expression.  Given verbal phrase/action picture pt able to complete with 2 syllable target word with ~ 75%. SLP further facilitated session by providing medication management task. Pt required Min A cues to identify beads that go with specific listed medicine. Min A to effectively problem solve throughout task with impulsivity noted when placing beads in appropriate time slot. Dad and aunt in room when SLP returned pt. Educaiton provided that thearpy team would like to leave lap alrm off when family present but must be in place when family leaves. Safety plan updated. Education provided to pt and family on  increases pt's verbal responses to greatings "hey, how are you?" while in safe setting for last weke of rehab. All voiced understanding.    Skilled treatment session #2 focused on completing education with pt's father and his aunt. Education provided on expressive aphasia, apraxia and cognitive function. Strategies to increase communication also reviewed (yes/no questions, providing context and verbal models), reviewed strengths such as ability to comprehend written language (sentence level) and cognitive abilities. All questions answered to their satisfaction and recommendation for outpatient therapy shared with family. They are supportive and able to take pt to therapies. Will communicate with CSW.      Function:  Eating Eating     Eating Assist Level: Set up assist for   Eating Set Up Assist For: Opening containers       Cognition Comprehension Comprehension assist level: Follows basic conversation/direction with no assist  Expression   Expression assist level: Expresses basic 50 - 74% of the time/requires cueing 25 - 49% of the time. Needs to repeat parts of sentences.  Social Interaction Social Interaction assist level: Interacts appropriately with others with medication or extra time (anti-anxiety, antidepressant).  Problem Solving Problem solving assist level: Solves complex 90% of the time/cues < 10% of the time;Solves basic problems with no assist  Memory Memory assist level: More than reasonable amount of time    Pain    Therapy/Group: Individual Therapy  Naseem Adler 03/31/2018, 11:02 AM

## 2018-04-01 ENCOUNTER — Inpatient Hospital Stay (HOSPITAL_COMMUNITY): Payer: BLUE CROSS/BLUE SHIELD | Admitting: Occupational Therapy

## 2018-04-01 ENCOUNTER — Inpatient Hospital Stay (HOSPITAL_COMMUNITY): Payer: BLUE CROSS/BLUE SHIELD | Admitting: Speech Pathology

## 2018-04-01 ENCOUNTER — Inpatient Hospital Stay (HOSPITAL_COMMUNITY): Payer: BLUE CROSS/BLUE SHIELD | Admitting: Physical Therapy

## 2018-04-01 ENCOUNTER — Ambulatory Visit (HOSPITAL_COMMUNITY): Payer: BLUE CROSS/BLUE SHIELD | Admitting: Physical Therapy

## 2018-04-01 DIAGNOSIS — L089 Local infection of the skin and subcutaneous tissue, unspecified: Secondary | ICD-10-CM

## 2018-04-01 DIAGNOSIS — L729 Follicular cyst of the skin and subcutaneous tissue, unspecified: Secondary | ICD-10-CM

## 2018-04-01 LAB — BASIC METABOLIC PANEL
Anion gap: 8 (ref 5–15)
BUN: 6 mg/dL (ref 6–20)
CHLORIDE: 105 mmol/L (ref 98–111)
CO2: 29 mmol/L (ref 22–32)
Calcium: 9.3 mg/dL (ref 8.9–10.3)
Creatinine, Ser: 0.72 mg/dL (ref 0.61–1.24)
GFR calc non Af Amer: >60 mL/min (ref >60)
Glucose, Bld: 102 mg/dL — ABNORMAL HIGH (ref 70–99)
POTASSIUM: 3.7 mmol/L (ref 3.5–5.1)
SODIUM: 142 mmol/L (ref 135–145)

## 2018-04-01 LAB — CBC
HEMATOCRIT: 40.3 % (ref 39.0–52.0)
HEMOGLOBIN: 13.9 g/dL (ref 13.0–17.0)
MCH: 34.8 pg — AB (ref 26.0–34.0)
MCHC: 34.5 g/dL (ref 30.0–36.0)
MCV: 101 fL — ABNORMAL HIGH (ref 78.0–100.0)
Platelets: 199 10*3/uL (ref 150–400)
RBC: 3.99 MIL/uL — AB (ref 4.22–5.81)
RDW: 12.5 % (ref 11.5–15.5)
WBC: 9 10*3/uL (ref 4.0–10.5)

## 2018-04-01 NOTE — Plan of Care (Signed)
Goal d/c 04/01/18

## 2018-04-01 NOTE — Progress Notes (Signed)
Occupational Therapy Session Note  Patient Details  Name: Xavier Villegas MRN: [id number] Date of Birth: [birth year]  Today's Date: 04/01/2018 OT Individual Time: 5456-2563 OT Individual Time Calculation (min): 66 min   Short Term Goals: Week 4:  OT Short Term Goal 1 (Week 4): STGs=LTGs secondary to upcoming discharge  Skilled Therapeutic Interventions/Progress Updates:    Pt greeted in w/c with family present. He shook his head when asked if he needed pain medication. Tx focus on family education during BADL routine. Pt self propelled to therapy apartment with vcs for avoiding obstacles on Rt. He ambulated with quad cane and Xavier Villegas to TTB with instruction on technique. Pt bathed while seated, utilizing lateral leans for perihygiene. [name] with hands on practice for facilitating Rt NMR and providing vcs for balance awareness. He dressed sit<stand from TTB after. Pt with 1 posterior LOB when elevating pants over hips. Recovered with Xavier assist. Advised them to have pt thread LEs into pants, and don footwear prior to standing to elevate pants to prevent LOBs at home. Especially because their tub is oriented so family will be on Lt side of pt. Pt able to complete BADL tasks with steady assist overall from Xavier. OT emphasized allowing pt to complete these tasks at max level of independence vs increasing physical assist when he takes extra time. Afterwards he completed ambulatory transfer with Xavier back to w/c with helmet on. When in room, reviewed PROM exercises and joint protection strategies for R UE. Educated family to keep PROM below shoulder level to prevent impingement. Family with hands on practice with these exercises and provided HEP handout. Throughout session, OT-family actively collaborated regarding home bathroom setup and environmental modifications to implement to maximize safety. At end of session pt was left with family and all needs.   Therapy Documentation Precautions:   Precautions Precautions: Fall Precaution Comments: bone flap in L abdomen, no L side lying Restrictions Weight Bearing Restrictions: No Vital Signs: Therapy Vitals Temp: 97.6 F (36.4 C) Temp Source: Oral Pulse Rate: 82 Resp: 12 BP: 123/81 Patient Position (if appropriate): Sitting Oxygen Therapy SpO2: 100 % O2 Device: Room Air Pain: Pain Assessment Pain Scale: 0-10 Pain Score: 0-No pain ADL:      See Function Navigator for Current Functional Status.   Therapy/Group: Individual Therapy  [name] A Jerine Surles 04/01/2018, 4:12 PM

## 2018-04-01 NOTE — Progress Notes (Signed)
Physical Therapy Session Note  Patient Details  Name: Koah Chisenhall MRN: [id number] Date of Birth: 06-Jan-1981  Today's Date: 04/01/2018 PT Individual Time: 1000-1030 PT Individual Time Calculation (min): 30 min   Short Term Goals: Week 4:  PT Short Term Goal 1 (Week 4): =LTGs due to ELOS  Skilled Therapeutic Interventions/Progress Updates:    Pt received seated in w/c in room, agreeable to PT. Pt denies having any pain. Manual w/c propulsion x 150 ft with L UE/LE and Supervision. Sit to stand with Supervision to QC. Ambulation 2 x 35', 2 x 50' with quad cane and min A, focus on BLE management with turning to sit in chair safely. With each bout of ambulation pt requires decreased verbal cues for technique. Sit to stand x 5 reps from w/c to quad cane with Supervision, focus on standing balance once in full stand. Squat pivot transfer w/c to/from mat table x 6 reps with CGA fading to SBA, focus on safe LE setup prior to initiating transfer. Pt left seated in w/c in room with needs in reach, quick-release belt in place.  Therapy Documentation Precautions:  Precautions Precautions: Fall Precaution Comments: bone flap in L abdomen, no L side lying Restrictions Weight Bearing Restrictions: No  See Function Navigator for Current Functional Status.   Therapy/Group: Individual Therapy  Excell Seltzer, PT, DPT  04/01/2018, 12:06 PM

## 2018-04-01 NOTE — Progress Notes (Signed)
Physical Therapy Weekly Progress Note  Patient Details  Name: Xavier Villegas MRN: [id number] Date of Birth: 1980/08/31  Beginning of progress report period: March 02, 2018 End of progress report period: April 01, 2018  Today's Date: 04/01/2018 PT Individual Time: 1100-1200 PT Individual Time Calculation (min): 60 min   Patient has met  8 of 11 long term goals.  Pt demonstrates ability to ambulate 160 feet with quad cane and CGA with occasional weight shift facilitation during fatigue onset. Pt performs all transfers and STS with supervision and intermittent verbal cues for safety awareness in distracting environments. Pt performs x8 steps with L HR with ability to recall proper sequencing and CGA. Pt will continue to benefit from functional mobility, community reintegration, strengthening, balance, and NMR to further progress towards achieving LTG's.   Patient continues to demonstrate the following deficits muscle weakness and muscle paralysis, decreased cardiorespiratoy endurance, impaired timing and sequencing, abnormal tone, unbalanced muscle activation, decreased coordination and decreased motor planning, R field cut requiring VC's for R visual scanning, decreased attention, decreased awareness, decreased problem solving, decreased safety awareness and delayed processing and decreased sitting balance, decreased standing balance, decreased postural control, hemiplegia and decreased balance strategies and therefore will continue to benefit from skilled PT intervention to increase functional independence with mobility.  Patient progressing toward long term goals..  Continue plan of care.  PT Short Term Goals Week 5:  PT Short Term Goal 1 (Week 5): =LTGs due to ELOS   Skilled Therapeutic Interventions/Progress Updates:    Pt gestures to no pain prior to session start. Today's session focuses on family education with hands on practice with assisting Pt with ambulation trials using quad  cane, transfers, stairs, and floor recovery. Pt propels w/c from room> gym for 75 feet using L hemi technique and supervision for R visual field scanning. Pt performs x2 ambulation trials consisting of 50 ft from hall<> apartment with caregivers providing CGA and intermittent min A due to x1 LOB episode posteriorly. Caregivers demonstrated good carry over from yesterday's family education session requiring min cues for proper hand placement for guarding techniques. Pt performs all transfers with quad cane throughout session with supervision and cueing from caregivers for proper foot placement prior to standing. Pt performs floor recovery from couch> mat on floor with supervision to descend to floor safely and mod A to boost from floor>couch provided by therapist. Pt demonstrates dec motor planning and quick fatigue with floor transfer requiring therapist to provide multimodal cues for recovery. Caregivers verbalize understanding regarding how to assist Pt from floor safely if scenario occurs in home. Pt then ambulates back to gym with quad cane and performs 2x4 steps with L HR with first trial ascending forwards and descending backwards with CGA and good recall for sequencing and 2nd trial ascending & descending forwards using L HR requiring min fade to mod A from therapist for R knee blocking, weight shifting and foot placement 2/2 fatigue onset. Pt then required seated rest break and performed 4 steps using L HR and descending backwards with caregiver providing CGA. Therapist propelled Pt back to room for time management and left him sitting with tray table and call bell in reach and all needs met.   Therapy Documentation Precautions:  Precautions Precautions: Fall Precaution Comments: bone flap in L abdomen, no L side lying Restrictions Weight Bearing Restrictions: No   See Function Navigator for Current Functional Status.  Therapy/Group: Individual Therapy  Floreen Comber 04/01/2018, 1:58 PM

## 2018-04-01 NOTE — Progress Notes (Signed)
Subjective/Complaints: Pt up in chair. Denies pain.   ROS: limited due to language/communication .      Objective: Vital Signs: Blood pressure 104/66, pulse 61, temperature 97.8 F (36.6 C), temperature source Oral, resp. rate 16, height 6' (1.829 m), weight 83.2 kg, SpO2 100 %. No results found. Results for orders placed or performed during the hospital encounter of 03/01/18 (from the past 72 hour(s))  CBC     Status: Abnormal   Collection Time: 04/01/18  5:10 AM  Result Value Ref Range   WBC 9.0 4.0 - 10.5 K/uL   RBC 3.99 (L) 4.22 - 5.81 MIL/uL   Hemoglobin 13.9 13.0 - 17.0 g/dL   HCT 40.3 39.0 - 52.0 %   MCV 101.0 (H) 78.0 - 100.0 fL   MCH 34.8 (H) 26.0 - 34.0 pg   MCHC 34.5 30.0 - 36.0 g/dL   RDW 12.5 11.5 - 15.5 %   Platelets 199 150 - 400 K/uL    Comment: Performed at [facility], [place] [address], [place], [place] [address]  Basic metabolic panel     Status: Abnormal   Collection Time: 04/01/18  5:10 AM  Result Value Ref Range   Sodium 142 135 - 145 mmol/L   Potassium 3.7 3.5 - 5.1 mmol/L   Chloride 105 98 - 111 mmol/L   CO2 29 22 - 32 mmol/L   Glucose, Bld 102 (H) 70 - 99 mg/dL   BUN 6 6 - 20 mg/dL   Creatinine, Ser 0.72 0.61 - 1.24 mg/dL   Calcium 9.3 8.9 - 10.3 mg/dL   GFR calc non Af Amer >60 >60 mL/min   GFR calc Af Amer >60 >60 mL/min    Comment: (NOTE) The eGFR has been calculated using the CKD EPI equation. This calculation has not been validated in all clinical situations. eGFR's persistently <60 mL/min signify possible Chronic Kidney Disease.    Anion gap 8 5 - 15    Comment: Performed at [facility] [address], [place], [place] [address]     Constitutional: No distress . Vital signs reviewed. HEENT: EOMI, oral membranes moist Neck: supple Cardiovascular: RRR without murmur. No JVD    Respiratory: CTA Bilaterally without wheezes or rales. Normal effort    GI: BS +, non-tender, non-distended  Neuro:  Expressive language  deficits. Improving comprehension, yes/no Motor: lUE/LLE: 5/5 proximal distal RUE 0/5-  1+   right HF/HE, 0/5 distaly MAS: Left elbow and finger flexors tr to 1/4,, pecs tr/4. Wrist,elbow  right ankle and plantar flexors tr to 1/4, toes up, DTR's remain 3+ Right Musc/Skel:  No right arm or leg pain with ROM Psych: pleasant  Skin:cyst, open left buttock, some irritation   Assessment/Plan: 1. Functional deficits secondary to left MCA infarct with hemorrhagic transformation status post craniectomy which require 3+ hours per day of interdisciplinary therapy in a comprehensive inpatient rehab setting. Physiatrist is providing close team supervision and 24 hour management of active medical problems listed below. Physiatrist and rehab team continue to assess barriers to discharge/monitor patient progress toward functional and medical goals. FIM: Function - Bathing Bathing activity did not occur: Refused Position: Shower Body parts bathed by patient: Right arm, Chest, Abdomen, Front perineal area, Right upper leg, Left upper leg, Left lower leg, Right lower leg, Left arm Body parts bathed by helper: Back, Buttocks Assist Level: Touching or steadying assistance(Pt > 75%)  Function- Upper Body Dressing/Undressing What is the patient wearing?: Pull over shirt/dress Pull over shirt/dress -  Perfomed by patient: Thread/unthread left sleeve, Put head through opening, Pull shirt over trunk, Thread/unthread right sleeve Pull over shirt/dress - Perfomed by helper: Thread/unthread right sleeve Assist Level: Supervision or verbal cues Set up : To obtain clothing/put away Function - Lower Body Dressing/Undressing What is the patient wearing?: Pants, Socks, Shoes, AFO, Ted Hose Position: Wheelchair/chair at [employer]- Performed by patient: Thread/unthread left pants leg, Pull pants up/down, Thread/unthread right pants leg Pants- Performed by helper: Thread/unthread left pants leg, Thread/unthread right  pants leg, Pull pants up/down Non-skid slipper socks- Performed by patient: Don/doff right sock, Don/doff left sock Non-skid slipper socks- Performed by helper: Don/doff right sock, Don/doff left sock Socks - Performed by patient: Don/doff right sock, Don/doff left sock Socks - Performed by helper: Don/doff right sock, Don/doff left sock Shoes - Performed by patient: Don/doff left shoe Shoes - Performed by helper: Don/doff right shoe AFO - Performed by patient: Don/doff right AFO AFO - Performed by helper: Don/doff right AFO TED Hose - Performed by helper: Don/doff right TED hose Assist for footwear: Partial/moderate assist Assist for lower body dressing: Touching or steadying assistance (Pt > 75%)  Function - Toileting Toileting activity did not occur: No continent bowel/bladder event Toileting steps completed by patient: Adjust clothing prior to toileting, Performs perineal hygiene, Adjust clothing after toileting Toileting steps completed by helper: Performs perineal hygiene, Adjust clothing after toileting Toileting Assistive Devices: Grab bar or rail Assist level: Touching or steadying assistance (Pt.75%)  Function - Air cabin crew transfer activity did not occur: Refused Toilet transfer assistive device: Elevated toilet seat/BSC over toilet, Cane Mechanical lift: Stedy Assist level to toilet: Touching or steadying assistance (Pt > 75%) Assist level from toilet: Touching or steadying assistance (Pt > 75%)  Function - Chair/bed transfer Chair/bed transfer activity did not occur: Safety/medical concerns Chair/bed transfer method: Stand pivot Chair/bed transfer assist level: Supervision or verbal cues Chair/bed transfer assistive device: Armrests, Cane Chair/bed transfer details: Verbal cues for sequencing, Verbal cues for technique, Verbal cues for safe use of DME/AE  Function - Locomotion: Wheelchair Will patient use wheelchair at discharge?: Yes Type: Manual Max  wheelchair distance: 75 Assist Level: Supervision or verbal cues Assist Level: Supervision or verbal cues Assist Level: Supervision or verbal cues Turns around,maneuvers to table,bed, and toilet,negotiates 3% grade,maneuvers on rugs and over doorsills: No Function - Locomotion: Ambulation Ambulation activity did not occur: Safety/medical concerns Assistive device: Cane-quad Max distance: 50 Assist level: Touching or steadying assistance (Pt > 75%) Assist level: Touching or steadying assistance (Pt > 75%) Walk 50 feet with 2 turns activity did not occur: Safety/medical concerns Assist level: Touching or steadying assistance (Pt > 75%) Walk 150 feet activity did not occur: Safety/medical concerns Assist level: Touching or steadying assistance (Pt > 75%) Walk 10 feet on uneven surfaces activity did not occur: Safety/medical concerns  Function - Comprehension Comprehension: Auditory Comprehension assist level: Follows basic conversation/direction with extra time/assistive device  Function - Expression Expression: Verbal Expression assistive device: Communication board Expression assist level: Expresses basic 50 - 74% of the time/requires cueing 25 - 49% of the time. Needs to repeat parts of sentences.  Function - Social Interaction Social Interaction assist level: Interacts appropriately 90% of the time - Needs monitoring or encouragement for participation or interaction.  Function - Problem Solving Problem solving assist level: Solves basic 90% of the time/requires cueing < 10% of the time  Function - Memory Memory assist level: More than reasonable amount of time Patient normally able to recall (  first 3 days only): Current season, Location of own room, Staff names and faces, That he or she is in a hospital   Medical Problem List and Plan: 1.  Right hemiparesis, dysphagia, and aphasia secondary to large left MCA infarct with hemorrhagic transformation s/p  hemicraniectomy.  Continue CIR, PT OT speech  Continue Helmet for safety OOB    2.  DVT Prophylaxis/Anticoagulation: Mechanical: Sequential compression devices, below knee Bilateral lower extremities 3. Pain Management: Tylenol as needed  -continue baclofen 92m qid  -continue splint/ROM 4. Mood: Consult LCSW  for evaluation and support 5. Neuropsych: This patient is not fully capable of making decisions on his own behalf. 6. Skin/Wound Care: doxycyline (8/22) for left buttock ?sebaceous cyst 7. Fluids/Electrolytes/Nutrition: Monitor I's/O's.   Encourage nectar thick fluid intake.    -recheck labs tomorrow 8. Dysphagia: Continue dysphagia 3, nectar liquids    Push po 9. Leucocytosis: ?Progressive and felt to be reactive--ID recommends d/c antibiotic and monitor for signs of infection.   Mother with history of CLL.  WBCs 10.5 8/2--> 9.0 8/12 > 9.0 8/23 10. High homocysteine levels/low folate levels/boderline low B12: On high dose folic supplement, B6 and received B12 inj 7/14  . MRHFR--heterozygous /single mutation ID.  May cause abnormal clotting 11. GERD: Continue PPI.  12. Hyponatremia  Sodium 139 on 8/12 13. Neurogenic bladder:  -incontinence improving  -continue to work on time voids/training    LOS (Days) 31 A FACE TO FACE EVALUATION WAS PERFORMED  ZMeredith Staggers8/23/2019, 9:06 AM

## 2018-04-01 NOTE — Progress Notes (Signed)
Speech Language Pathology Daily Session Note  Patient Details  Name: Xavier Villegas MRN: [id number] Date of Birth: 10-15-1980  Today's Date: 04/01/2018 SLP Individual Time: 3545-6256 SLP Individual Time Calculation (min): 40 min  Short Term Goals: Week 5: SLP Short Term Goal 1 (Week 5): Pt will utilize multimodal communication to express wants/needs with supervision cues in 9 out of 10 opportunities.  SLP Short Term Goal 2 (Week 5): Using multimodal forms of communications pt will accurately answer questions related to safety awareness with 90% accuracy.  SLP Short Term Goal 3 (Week 5): Given Mod A cues, pt will produce vowels in 8 out of 10 opportunities.  SLP Short Term Goal 4 (Week 5): Given Mod A cues, pt will combine consonant with vowel in 8 out of 10 opportunities.  SLP Short Term Goal 5 (Week 5): Pt will complete semi-complex problem solving tasks with supervision cues.  SLP Short Term Goal 6 (Week 5): Given Max A mutlimodal cues, pt will name common objects in 5 out of 10 opportunities.   Skilled Therapeutic Interventions:  Pt was seen for skilled ST targeting cognitive goals.  Pt was received seated upright in wheelchair, awake, alert, and agreeable to participate in therapy.  Pt was able to produce CV syllables in combinations of oo, ay, ee, I, o, m, p, b, w for ~75% accuracy with mod assist verbal cues to recognize and correct errors.  Pt was also able to produce simple CVC words containing targeted initial phonemes during melodic intonation exercises for ~75% accuracy.  Pt was initially perseverative and had difficulty transitioning between words; however, when intonation of the carrier phrase was modified between each trial perseveration was greatly reduced.  Pt was returned to room and left in wheelchair with call bell within reach and chair alarm set.  Continue per current plan of care.    Function:  Eating Eating               Cognition Comprehension  Comprehension assist level: Follows basic conversation/direction with extra time/assistive device  Expression   Expression assist level: Expresses basic 50 - 74% of the time/requires cueing 25 - 49% of the time. Needs to repeat parts of sentences.  Social Interaction Social Interaction assist level: Interacts appropriately 90% of the time - Needs monitoring or encouragement for participation or interaction.  Problem Solving Problem solving assist level: Solves basic 90% of the time/requires cueing < 10% of the time  Memory Memory assist level: Recognizes or recalls 75 - 89% of the time/requires cueing 10 - 24% of the time    Pain Pain Assessment Pain Scale: 0-10 Pain Score: 0-No pain  Therapy/Group: Individual Therapy  Ionna Avis, Selinda Orion 04/01/2018, 1:11 PM

## 2018-04-02 ENCOUNTER — Inpatient Hospital Stay (HOSPITAL_COMMUNITY): Payer: BLUE CROSS/BLUE SHIELD | Admitting: Speech Pathology

## 2018-04-02 ENCOUNTER — Inpatient Hospital Stay (HOSPITAL_COMMUNITY): Payer: BLUE CROSS/BLUE SHIELD | Admitting: Occupational Therapy

## 2018-04-02 DIAGNOSIS — G8110 Spastic hemiplegia affecting unspecified side: Secondary | ICD-10-CM

## 2018-04-02 DIAGNOSIS — I69331 Monoplegia of upper limb following cerebral infarction affecting right dominant side: Secondary | ICD-10-CM

## 2018-04-02 DIAGNOSIS — I69332 Monoplegia of upper limb following cerebral infarction affecting left dominant side: Secondary | ICD-10-CM

## 2018-04-02 NOTE — Progress Notes (Signed)
Speech Language Pathology Daily Session Note  Patient Details  Name: Shaylon Aden MRN: [id number] Date of Birth: [birth year]  Today's Date: 04/02/2018 SLP Individual Time: 0998-3382 SLP Individual Time Calculation (min): 42 min  Short Term Goals: Week 5: SLP Short Term Goal 1 (Week 5): Pt will utilize multimodal communication to express wants/needs with supervision cues in 9 out of 10 opportunities.  SLP Short Term Goal 2 (Week 5): Using multimodal forms of communications pt will accurately answer questions related to safety awareness with 90% accuracy.  SLP Short Term Goal 3 (Week 5): Given Mod A cues, pt will produce vowels in 8 out of 10 opportunities.  SLP Short Term Goal 4 (Week 5): Given Mod A cues, pt will combine consonant with vowel in 8 out of 10 opportunities.  SLP Short Term Goal 5 (Week 5): Pt will complete semi-complex problem solving tasks with supervision cues.  SLP Short Term Goal 6 (Week 5): Given Max A mutlimodal cues, pt will name common objects in 5 out of 10 opportunities.   Skilled Therapeutic Interventions:  Pt was seen for skilled ST targeting communication goals.  Pt was able to produce CV syllables in combinations of oo, ay, ee, i, o, m, p, b, w for ~75% accuracy with mod assist verbal cues to recognize and correct errors.  Pt was also able to produce simple CVC words containing targeted initial phonemes during melodic intonation exercises for ~75% accuracy.  Pt was stimulable for production of new phoneme L in combination with the abovementioned vowel sounds for simple CV words with mod-max cues and a carrier phrase.  Pt was returned tro room and left in wheelchair with chair alarm set.  Continue per current plan of care.   Function:  Eating Eating                 Cognition Comprehension Comprehension assist level: Follows basic conversation/direction with extra time/assistive device  Expression   Expression assist level: Expresses basic 50 - 74%  of the time/requires cueing 25 - 49% of the time. Needs to repeat parts of sentences.  Social Interaction Social Interaction assist level: Interacts appropriately 90% of the time - Needs monitoring or encouragement for participation or interaction.  Problem Solving Problem solving assist level: Solves basic 90% of the time/requires cueing < 10% of the time  Memory Memory assist level: Recognizes or recalls 75 - 89% of the time/requires cueing 10 - 24% of the time    Pain Pain Assessment Pain Scale: 0-10 Pain Score: 0-No pain  Therapy/Group: Individual Therapy  Nour Scalise, Selinda Orion 04/02/2018, 2:02 PM

## 2018-04-02 NOTE — Progress Notes (Signed)
Subjective/Complaints: Patient seen sitting up in bed this morning. He appears to indicate that he slept well overnight. No reported issues.  ROS: limited due to nonverbal.    Objective: Vital Signs: Blood pressure 131/87, pulse 73, temperature 98 F (36.7 C), temperature source Oral, resp. rate 18, height 6' (1.829 m), weight 83.2 kg, SpO2 100 %. No results found. Results for orders placed or performed during the hospital encounter of 03/01/18 (from the past 72 hour(s))  CBC     Status: Abnormal   Collection Time: 04/01/18  5:10 AM  Result Value Ref Range   WBC 9.0 4.0 - 10.5 K/uL   RBC 3.99 (L) 4.22 - 5.81 MIL/uL   Hemoglobin 13.9 13.0 - 17.0 g/dL   HCT 40.3 39.0 - 52.0 %   MCV 101.0 (H) 78.0 - 100.0 fL   MCH 34.8 (H) 26.0 - 34.0 pg   MCHC 34.5 30.0 - 36.0 g/dL   RDW 12.5 11.5 - 15.5 %   Platelets 199 150 - 400 K/uL    Comment: Performed at [facility], [place] [address] [place], [place] [address]  Basic metabolic panel     Status: Abnormal   Collection Time: 04/01/18  5:10 AM  Result Value Ref Range   Sodium 142 135 - 145 mmol/L   Potassium 3.7 3.5 - 5.1 mmol/L   Chloride 105 98 - 111 mmol/L   CO2 29 22 - 32 mmol/L   Glucose, Bld 102 (H) 70 - 99 mg/dL   BUN 6 6 - 20 mg/dL   Creatinine, Ser 0.72 0.61 - 1.24 mg/dL   Calcium 9.3 8.9 - 10.3 mg/dL   GFR calc non Af Amer >60 >60 mL/min   GFR calc Af Amer >60 >60 mL/min    Comment: (NOTE) The eGFR has been calculated using the CKD EPI equation. This calculation has not been validated in all clinical situations. eGFR's persistently <60 mL/min signify possible Chronic Kidney Disease.    Anion gap 8 5 - 15    Comment: Performed at [facility] [address] [place],  [address]     Constitutional: No distress . Vital signs reviewed. HENT: Normocephalic.  Atraumatic. Eyes: EOMI. No discharge. Cardiovascular: RRR. No JVD. Respiratory: CTA Bilaterally. Normal effort. GI: BS +. Non-distended. Musc: No  edema or tenderness in extremities. Neuro:  Expressive language deficits. Comprehension improving Motor: LUE/LLE: 5/5 proximal distal RUE 0/5 with increased tone RLE: 2/5 proximal to distal Psych: pleasant  Skin: intact. Warm and dry.  Assessment/Plan: 1. Functional deficits secondary to left MCA infarct with hemorrhagic transformation status post craniectomy which require 3+ hours per day of interdisciplinary therapy in a comprehensive inpatient rehab setting. Physiatrist is providing close team supervision and 24 hour management of active medical problems listed below. Physiatrist and rehab team continue to assess barriers to discharge/monitor patient progress toward functional and medical goals. FIM: Function - Bathing Bathing activity did not occur: Refused Position: Shower Body parts bathed by patient: Right arm, Chest, Abdomen, Front perineal area, Right upper leg, Left upper leg, Left lower leg, Right lower leg, Left arm, Buttocks Body parts bathed by helper: Back, Buttocks Bathing not applicable: Back Assist Level: Touching or steadying assistance(Pt > 75%)  Function- Upper Body Dressing/Undressing What is the patient wearing?: Pull over shirt/dress Pull over shirt/dress - Perfomed by patient: Thread/unthread left sleeve, Put head through opening, Pull shirt over trunk, Thread/unthread right sleeve Pull over shirt/dress - Perfomed by helper: Thread/unthread right sleeve Assist Level: Supervision  or verbal cues Set up : To obtain clothing/put away Function - Lower Body Dressing/Undressing What is the patient wearing?: Pants, Socks, Shoes, AFO, Ted Hose Position: Other (comment)(sitting on TTB) Pants- Performed by patient: Thread/unthread left pants leg, Pull pants up/down, Thread/unthread right pants leg Pants- Performed by helper: Thread/unthread left pants leg, Thread/unthread right pants leg, Pull pants up/down Non-skid slipper socks- Performed by patient: Don/doff right  sock, Don/doff left sock Non-skid slipper socks- Performed by helper: Don/doff right sock, Don/doff left sock Socks - Performed by patient: Don/doff right sock, Don/doff left sock Socks - Performed by helper: Don/doff right sock, Don/doff left sock Shoes - Performed by patient: Don/doff left shoe, Don/doff right shoe Shoes - Performed by helper: Don/doff right shoe AFO - Performed by patient: Don/doff right AFO AFO - Performed by helper: Don/doff right AFO TED Hose - Performed by helper: Don/doff right TED hose Assist for footwear: Supervision/touching assist Assist for lower body dressing: Touching or steadying assistance (Pt > 75%)  Function - Toileting Toileting activity did not occur: No continent bowel/bladder event Toileting steps completed by patient: Adjust clothing prior to toileting, Performs perineal hygiene, Adjust clothing after toileting Toileting steps completed by helper: Performs perineal hygiene, Adjust clothing after toileting Toileting Assistive Devices: Grab bar or rail Assist level: Touching or steadying assistance (Pt.75%)  Function - Air cabin crew transfer activity did not occur: Refused Toilet transfer assistive device: Elevated toilet seat/BSC over toilet, Cane Mechanical lift: Stedy Assist level to toilet: Touching or steadying assistance (Pt > 75%) Assist level from toilet: Touching or steadying assistance (Pt > 75%)  Function - Chair/bed transfer Chair/bed transfer activity did not occur: Safety/medical concerns Chair/bed transfer method: Stand pivot, Ambulatory Chair/bed transfer assist level: Supervision or verbal cues Chair/bed transfer assistive device: Armrests, Cane Chair/bed transfer details: Verbal cues for sequencing, Verbal cues for technique, Verbal cues for safe use of DME/AE  Function - Locomotion: Wheelchair Will patient use wheelchair at discharge?: Yes Type: Manual Max wheelchair distance: 75 Assist Level: Supervision or  verbal cues Assist Level: Supervision or verbal cues Assist Level: Supervision or verbal cues Turns around,maneuvers to table,bed, and toilet,negotiates 3% grade,maneuvers on rugs and over doorsills: No Function - Locomotion: Ambulation Ambulation activity did not occur: Safety/medical concerns Assistive device: Cane-quad Max distance: 22' Assist level: Touching or steadying assistance (Pt > 75%) Assist level: Touching or steadying assistance (Pt > 75%) Walk 50 feet with 2 turns activity did not occur: Safety/medical concerns Assist level: Touching or steadying assistance (Pt > 75%) Walk 150 feet activity did not occur: Safety/medical concerns Assist level: Touching or steadying assistance (Pt > 75%) Walk 10 feet on uneven surfaces activity did not occur: Safety/medical concerns  Function - Comprehension Comprehension: Auditory Comprehension assist level: Follows basic conversation/direction with extra time/assistive device  Function - Expression Expression: Verbal Expression assistive device: Communication board Expression assist level: Expresses basic 50 - 74% of the time/requires cueing 25 - 49% of the time. Needs to repeat parts of sentences.  Function - Social Interaction Social Interaction assist level: Interacts appropriately 90% of the time - Needs monitoring or encouragement for participation or interaction.  Function - Problem Solving Problem solving assist level: Solves basic 90% of the time/requires cueing < 10% of the time  Function - Memory Memory assist level: Recognizes or recalls 75 - 89% of the time/requires cueing 10 - 24% of the time Patient normally able to recall (first 3 days only): Current season, Location of own room, Staff names and faces, That  he or she is in a hospital   Medical Problem List and Plan: 1.  Right hemiparesis, dysphagia, and aphasia secondary to large left MCA infarct with hemorrhagic transformation s/p hemicraniectomy.  Continue  CIR  Continue Helmet for safety OOB 2.  DVT Prophylaxis/Anticoagulation: Mechanical: Sequential compression devices, below knee Bilateral lower extremities 3. Pain Management: Tylenol as needed  -continue baclofen 46m qid  -continue splint/ROM 4. Mood: Consult LCSW  for evaluation and support 5. Neuropsych: This patient is not fully capable of making decisions on his own behalf. 6. Skin/Wound Care: doxycyline (8/22) for left buttock ?sebaceous cyst 7. Fluids/Electrolytes/Nutrition: Monitor I's/O's.       BMP within acceptable range on 8/23 8. Dysphagia:   Advanced to regular diet with thin liquids with supervision 9. Leucocytosis: Resolved  Felt to be reactive--ID recommends d/c antibiotic and monitor for signs of infection.   Mother with history of CLL.  WBCs 9.0 on 8/23 10. High homocysteine levels/low folate levels/boderline low B12: On high dose folic supplement, B6 and received B12 inj 7/14  . MRHFR--heterozygous /single mutation ID.  May cause abnormal clotting 11. GERD: Continue PPI.  12. Hyponatremia  Sodium 139 on 8/12 13. Neurogenic bladder:  -incontinence improving  -continue to work on time voids/training    LOS (Days) 32 A FACE TO FACE EVALUATION WAS PERFORMED  Xavier Villegas ALorie Phenix8/24/2019, 9:17 AM

## 2018-04-02 NOTE — Progress Notes (Signed)
Occupational Therapy Session Note  Patient Details  Name: Xavier Villegas MRN: [id number] Date of Birth: [birth year]  Today's Date: 04/02/2018 OT Individual Time: 1405-1501  OT Individual Treatment Time Calculation: 56 min  Skilled Therapeutic Interventions/Progress Updates:    Pt greeted in w/c with no c/o pain. Started session with handwashing and oral care w/c level with HOH for integrating R UE. After, tx focus on Rt attention, cognition, and activity tolerance in stimulating community setting. Took pt to atrium and he self propelled around obstacles in food court, [employer], and gift shop using hemi technique. He required mod vcs for Rt attention. Pt verbalizing "brownie," "frog," and "water" during tx! Afterwards, pt self propelled back to unit. He required min vcs for pathfinding the way back. At end of session he was left with family.   Therapy Documentation Precautions:  Precautions Precautions: Fall Precaution Comments: bone flap in L abdomen, no L side lying Restrictions Weight Bearing Restrictions: No Vital Signs: Therapy Vitals Temp: 97.7 F (36.5 C) Temp Source: Oral Pulse Rate: 66 Resp: 18 BP: 133/88 Patient Position (if appropriate): Sitting Oxygen Therapy SpO2: 100 % O2 Device: Room Air Pain: Pain Assessment Pain Scale: 0-10 Pain Score: 0-No pain ADL:      See Function Navigator for Current Functional Status.   Therapy/Group: Individual Therapy  Iolani Twilley A Valisa Karpel 04/02/2018, 4:27 PM

## 2018-04-03 ENCOUNTER — Inpatient Hospital Stay (HOSPITAL_COMMUNITY): Payer: BLUE CROSS/BLUE SHIELD

## 2018-04-03 DIAGNOSIS — K592 Neurogenic bowel, not elsewhere classified: Secondary | ICD-10-CM

## 2018-04-03 DIAGNOSIS — N319 Neuromuscular dysfunction of bladder, unspecified: Secondary | ICD-10-CM

## 2018-04-03 DIAGNOSIS — R159 Full incontinence of feces: Secondary | ICD-10-CM

## 2018-04-03 NOTE — Progress Notes (Signed)
Physical Therapy Session Note  Patient Details  Name: Xavier Villegas MRN: [id number] Date of Birth: 1980/11/13  Today's Date: 04/03/2018 PT Individual Time: 1400-1500 PT Individual Time Calculation (min): 60 min   Short Term Goals: Week 5:  PT Short Term Goal 1 (Week 5): =LTGs due to ELOS   Skilled Therapeutic Interventions/Progress Updates:    Pt seated in w/c upon PT arrival, agreeable to therapy tx and denies pain. Pt propelled w/c from room>gym with supervision, verbal cues for R environmental awareness. Pt performed stand pivot to mat with min assist. Pt performed x 10 sit<>stands from mat without UE support, emphasis on symmetric weightbearing and manual facilitation for increased lateral weightshift over R LE. Pt performed 2 x 10 sit<>stands from mat with 4 inch step under L LE for increased R LE weightbearing, verbal cues for upright posture and manual facilitation for increased R LE weightbearing/R hip extension. Pt transferred back to w/c squat pivot with supervision. Pt transferred from sit>stand>tall kneeling on mat with mod assist and use of bench for L UE support. Pt maintained tall kneeling without UE support while therapist applied manual perturbations in all directions working on postural control and balance. In tall kneeling with UE support on bench pt performed hip extensions x10 for NMR and strengthening. In tall kneeling pt walked on knees forwards/backwards without UE support and then sideways with UE support, all min-mod assist. Pt transferred to sitting as needed for rest break secondary to fatigue. Pt worked on dynamic standing balance and R LE stance control while taking steps in all directions, forward/back/sideways with L LE all without UE support, min-mod assist. Pt worked on dynamic standing balance without UE support with L LE on dynadisc and reaching outside BOS to the R for horseshoes to toss, verbal/tactile cues to prevent LOB to the R, min-mod assist, x 2  trials. Pt transferred back to w/c and propelled back to room, left in care of family with needs in reach.   Therapy Documentation Precautions:  Precautions Precautions: Fall Precaution Comments: bone flap in L abdomen, no L side lying Restrictions Weight Bearing Restrictions: No   See Function Navigator for Current Functional Status.   Therapy/Group: Individual Therapy  Netta Corrigan, PT, DPT 04/03/2018, 8:00 AM

## 2018-04-03 NOTE — Progress Notes (Signed)
Occupational Therapy Session Note  Patient Details  Name: Xavier Villegas MRN: [id number] Date of Birth: 1981/07/13  Today's Date: 04/03/2018 OT Individual Time: 2301-7209 OT Individual Time Calculation (min): 41 min    Skilled Therapeutic Interventions/Progress Updates:    1:1. Pt seated in w/c upon arrival and denies pain. Pt report need to toilet. Pt requires steady A and VC for R foot placement prior to transfer using grab bar to stand pivot to toilet. Pt requires steady A for clothing management and Pt voids urine seated on toilet. Pt propel w/c to tx gym with supervision. Pt completes standing balance/tolerance activites at high low table with RUE in WB position at forarm and/or wrist with supervision-CGA with VC for R knee extension (vertical checkers, clothespins to high basketball hoop and corn hole). Pt able to reach down to ground to pick up clothes pin from floor with 1 LOB laterally R with min A to correct. Exited session with pt seated in w/c, call light in reach, belt alarm on and all needs met.  Therapy Documentation Precautions:  Precautions Precautions: Fall Precaution Comments: bone flap in L abdomen, no L side lying Restrictions Weight Bearing Restrictions: No General:   Vital Signs: Therapy Vitals Temp: 97.8 F (36.6 C) Temp Source: Oral Pulse Rate: 77 Resp: 18 BP: (!) 123/97 Patient Position (if appropriate): Sitting Oxygen Therapy SpO2: 100 % O2 Device: Room Air  See Function Navigator for Current Functional Status.   Therapy/Group: Individual Therapy  Tonny Branch 04/03/2018, 4:49 PM

## 2018-04-03 NOTE — Progress Notes (Signed)
Subjective/Complaints: Patient seen sitting up in bed this morning. He appears to indicate that he slept well overnight, but provide inconsistent yes/no responses. No reported issues overnight.  ROS: limited due to nonverbal/cognition.    Objective: Vital Signs: Blood pressure 122/82, pulse 67, temperature 97.8 F (36.6 C), temperature source Oral, resp. rate 16, height 6' (1.829 m), weight 83.2 kg, SpO2 100 %. No results found. Results for orders placed or performed during the hospital encounter of 03/01/18 (from the past 72 hour(s))  CBC     Status: Abnormal   Collection Time: 04/01/18  5:10 AM  Result Value Ref Range   WBC 9.0 4.0 - 10.5 K/uL   RBC 3.99 (L) 4.22 - 5.81 MIL/uL   Hemoglobin 13.9 13.0 - 17.0 g/dL   HCT 40.3 39.0 - 52.0 %   MCV 101.0 (H) 78.0 - 100.0 fL   MCH 34.8 (H) 26.0 - 34.0 pg   MCHC 34.5 30.0 - 36.0 g/dL   RDW 12.5 11.5 - 15.5 %   Platelets 199 150 - 400 K/uL    Comment: Performed at [facility], [place] [address], [place], [place] [address]  Basic metabolic panel     Status: Abnormal   Collection Time: 04/01/18  5:10 AM  Result Value Ref Range   Sodium 142 135 - 145 mmol/L   Potassium 3.7 3.5 - 5.1 mmol/L   Chloride 105 98 - 111 mmol/L   CO2 29 22 - 32 mmol/L   Glucose, Bld 102 (H) 70 - 99 mg/dL   BUN 6 6 - 20 mg/dL   Creatinine, Ser 0.72 0.61 - 1.24 mg/dL   Calcium 9.3 8.9 - 10.3 mg/dL   GFR calc non Af Amer >60 >60 mL/min   GFR calc Af Amer >60 >60 mL/min    Comment: (NOTE) The eGFR has been calculated using the CKD EPI equation. This calculation has not been validated in all clinical situations. eGFR's persistently <60 mL/min signify possible Chronic Kidney Disease.    Anion gap 8 5 - 15    Comment: Performed at [facility] [address] [place], [place] [address]     Constitutional: No distress . Vital signs reviewed. HENT: Normocephalic.  Atraumatic. Eyes: EOMI. No discharge. Cardiovascular: RRR. No JVD. Respiratory: CTA  bilaterally. Normal effort. GI: BS +. Non-distended. Musc: No edema or tenderness in extremities. Neuro:  Expressive language deficits. Comprehension improving Motor: LUE/LLE: 5/5 proximal distal RUE 0/5 with increased tone : (Stable) RLE: 2/5 proximal to distal (stable) Psych: pleasant   Skin: intact. Warm and dry.  Assessment/Plan: 1. Functional deficits secondary to left MCA infarct with hemorrhagic transformation status post craniectomy which require 3+ hours per day of interdisciplinary therapy in a comprehensive inpatient rehab setting. Physiatrist is providing close team supervision and 24 hour management of active medical problems listed below. Physiatrist and rehab team continue to assess barriers to discharge/monitor patient progress toward functional and medical goals. FIM: Function - Bathing Bathing activity did not occur: Refused Position: Shower Body parts bathed by patient: Right arm, Chest, Abdomen, Front perineal area, Right upper leg, Left upper leg, Left lower leg, Right lower leg, Left arm, Buttocks Body parts bathed by helper: Back, Buttocks Bathing not applicable: Back Assist Level: Touching or steadying assistance(Pt > 75%)  Function- Upper Body Dressing/Undressing What is the patient wearing?: Pull over shirt/dress Pull over shirt/dress - Perfomed by patient: Thread/unthread left sleeve, Put head through opening, Pull shirt over trunk, Thread/unthread right sleeve Pull over shirt/dress -  Perfomed by helper: Thread/unthread right sleeve Assist Level: Supervision or verbal cues Set up : To obtain clothing/put away Function - Lower Body Dressing/Undressing What is the patient wearing?: Pants, Socks, Shoes, AFO, Ted Hose Position: Other (comment)(sitting on TTB) Pants- Performed by patient: Thread/unthread left pants leg, Pull pants up/down, Thread/unthread right pants leg Pants- Performed by helper: Thread/unthread left pants leg, Thread/unthread right pants  leg, Pull pants up/down Non-skid slipper socks- Performed by patient: Don/doff right sock, Don/doff left sock Non-skid slipper socks- Performed by helper: Don/doff right sock, Don/doff left sock Socks - Performed by patient: Don/doff right sock, Don/doff left sock Socks - Performed by helper: Don/doff right sock, Don/doff left sock Shoes - Performed by patient: Don/doff left shoe, Don/doff right shoe Shoes - Performed by helper: Don/doff right shoe AFO - Performed by patient: Don/doff right AFO AFO - Performed by helper: Don/doff right AFO TED Hose - Performed by helper: Don/doff right TED hose Assist for footwear: Supervision/touching assist Assist for lower body dressing: Touching or steadying assistance (Pt > 75%)  Function - Toileting Toileting activity did not occur: No continent bowel/bladder event Toileting steps completed by patient: Adjust clothing prior to toileting, Performs perineal hygiene, Adjust clothing after toileting Toileting steps completed by helper: Performs perineal hygiene, Adjust clothing after toileting Toileting Assistive Devices: Grab bar or rail Assist level: Touching or steadying assistance (Pt.75%)  Function - Air cabin crew transfer activity did not occur: Refused Toilet transfer assistive device: Elevated toilet seat/BSC over toilet, Cane Mechanical lift: Stedy Assist level to toilet: Touching or steadying assistance (Pt > 75%) Assist level from toilet: Touching or steadying assistance (Pt > 75%)  Function - Chair/bed transfer Chair/bed transfer activity did not occur: Safety/medical concerns Chair/bed transfer method: Stand pivot, Ambulatory Chair/bed transfer assist level: Supervision or verbal cues Chair/bed transfer assistive device: Armrests, Cane Chair/bed transfer details: Verbal cues for sequencing, Verbal cues for technique, Verbal cues for safe use of DME/AE  Function - Locomotion: Wheelchair Will patient use wheelchair at  discharge?: Yes Type: Manual Max wheelchair distance: 75 Assist Level: Supervision or verbal cues Assist Level: Supervision or verbal cues Assist Level: Supervision or verbal cues Turns around,maneuvers to table,bed, and toilet,negotiates 3% grade,maneuvers on rugs and over doorsills: No Function - Locomotion: Ambulation Ambulation activity did not occur: Safety/medical concerns Assistive device: Cane-quad Max distance: 33' Assist level: Touching or steadying assistance (Pt > 75%) Assist level: Touching or steadying assistance (Pt > 75%) Walk 50 feet with 2 turns activity did not occur: Safety/medical concerns Assist level: Touching or steadying assistance (Pt > 75%) Walk 150 feet activity did not occur: Safety/medical concerns Assist level: Touching or steadying assistance (Pt > 75%) Walk 10 feet on uneven surfaces activity did not occur: Safety/medical concerns  Function - Comprehension Comprehension: Auditory Comprehension assist level: Follows basic conversation/direction with extra time/assistive device  Function - Expression Expression: Verbal Expression assistive device: Communication board Expression assist level: Expresses basic 50 - 74% of the time/requires cueing 25 - 49% of the time. Needs to repeat parts of sentences.  Function - Social Interaction Social Interaction assist level: Interacts appropriately 90% of the time - Needs monitoring or encouragement for participation or interaction.  Function - Problem Solving Problem solving assist level: Solves basic 90% of the time/requires cueing < 10% of the time  Function - Memory Memory assist level: Recognizes or recalls 75 - 89% of the time/requires cueing 10 - 24% of the time Patient normally able to recall (first 3 days only): Current season,  Location of own room, Staff names and faces, That he or she is in a hospital   Medical Problem List and Plan: 1.  Right hemiparesis, dysphagia, and aphasia secondary to large  left MCA infarct with hemorrhagic transformation s/p hemicraniectomy.  Continue CIR  Continue Helmet for safety OOB 2.  DVT Prophylaxis/Anticoagulation: Mechanical: Sequential compression devices, below knee Bilateral lower extremities 3. Pain Management: Tylenol as needed  -continue baclofen 75m qid  -continue splint/ROM 4. Mood: Consult LCSW  for evaluation and support 5. Neuropsych: This patient is not fully capable of making decisions on his own behalf. 6. Skin/Wound Care: doxycyline (8/22) for left buttock ?sebaceous cyst 7. Fluids/Electrolytes/Nutrition: Monitor I's/O's.       BMP within acceptable range on 8/23 8. Dysphagia:   Advanced to regular diet with thin liquids with supervision 9. Leucocytosis: Resolved  Felt to be reactive--ID recommends d/c antibiotic and monitor for signs of infection.   Mother with history of CLL.  WBCs 9.0 on 8/23 10. High homocysteine levels/low folate levels/boderline low B12: On high dose folic supplement, B6 and received B12 inj 7/14  . MRHFR--heterozygous /single mutation ID.  May cause abnormal clotting 11. GERD: Continue PPI.  12. Hyponatremia: Resolved  Sodium 142 on 8/23 13. Neurogenic bladder/bowel:  -incontinence bladder improved  -continue to work on time voids/training   -incontinent stool this AM, otherwise content, will continue to monitor   LOS (Days) 33 A FACE TO FACE EVALUATION WAS PERFORMED  Haizley Cannella ALorie Phenix8/25/2019, 7:30 AM

## 2018-04-04 ENCOUNTER — Inpatient Hospital Stay (HOSPITAL_COMMUNITY): Payer: BLUE CROSS/BLUE SHIELD | Admitting: Physical Therapy

## 2018-04-04 ENCOUNTER — Inpatient Hospital Stay (HOSPITAL_COMMUNITY): Payer: BLUE CROSS/BLUE SHIELD | Admitting: Occupational Therapy

## 2018-04-04 ENCOUNTER — Inpatient Hospital Stay (HOSPITAL_COMMUNITY): Payer: BLUE CROSS/BLUE SHIELD

## 2018-04-04 NOTE — Progress Notes (Signed)
Monitor and assisted throughout shift , denies pain or disccomfort, no acute distress or complaint

## 2018-04-04 NOTE — Progress Notes (Signed)
Physical Therapy Session Note  Patient Details  Name: Xavier Villegas MRN: [id number] Date of Birth: 1980/11/16  Today's Date: 04/04/2018 PT Individual Time: 0900-1000 PT Individual Time Calculation (min): 60 min   Short Term Goals: Week 5:  PT Short Term Goal 1 (Week 5): =LTGs due to ELOS   Skilled Therapeutic Interventions/Progress Updates:    no c/o pain.  Session focus on functional mobility and NMR for RLE.    Pt propels w/c throughout unit with supervision and occasional verbal cues for attention to R side.  Transfers throughout session with close supervision and verbal cues for RLE positioning to promote weight bearing.  Gait training with SBQC and min guard for balance through obstacle course focus on turns and stepping over obstacles.  Kinetron in standing for NMR with focus on balance, RLE forced use, and weight shifting.  3 trials to fatigue with mod fade to min assist for balance with progression back to mod assist with fatigue.  Blocked practice squat/stand pivot transfers R and L without UE support focus on forced RLE use during transfers.  Pt completes 2 trials of repeated transfers w/c<>mat with rest break in between, min verbal cues for LE set up and head/hips relationship, and occasional min guard for balance.  Gait training back to room with [facility] and min guard, one instance of mod assist in highly distracting environment, min cues for attention to R step length and L weight shift.  Pt returned to w/c at end of session with call bell in reach and needs met.   Therapy Documentation Precautions:  Precautions Precautions: Fall Precaution Comments: bone flap in L abdomen, no L side lying Restrictions Weight Bearing Restrictions: No  See Function Navigator for Current Functional Status.   Therapy/Group: Individual Therapy  Michel Santee 04/04/2018, 9:29 AM

## 2018-04-04 NOTE — Progress Notes (Signed)
Occupational Therapy Session Note  Patient Details  Name: Xavier Villegas MRN: [id number] Date of Birth: October 16, 1980  Today's Date: 04/04/2018 OT Individual Time: 1530-1600 OT Individual Time Calculation (min): 30 min    Short Term Goals: Week 4:  OT Short Term Goal 1 (Week 4): STGs=LTGs secondary to upcoming discharge  Skilled Therapeutic Interventions/Progress Updates:    Upon entering the room, pt seated in wheelchair awaiting OT arrival. Pt with no c/o pain this session. Pt declined toileting. Pt requesting ambulating with single point cane. Pt standing from wheelchair with min A and ambulating with cane 150' toward gym with overall min A. Pt taking seated rest break secondary to fatigue. Pt required more verbal and tactile cue for weight shift and R LE advancement secondary to fatigue but still ambulating with min A back to room in same manner. Pt able to verbalize a few words this session during functional conversation and otherwise gesturing with mod cues to do so. Pt remained seated in wheelchair with alarm belt donned and call bell within reach.   Therapy Documentation Precautions:  Precautions Precautions: Fall Precaution Comments: bone flap in L abdomen, no L side lying Restrictions Weight Bearing Restrictions: No Pain: Pain Assessment Pain Score: 0-No pain  See Function Navigator for Current Functional Status.   Therapy/Group: Individual Therapy  [name] 04/04/2018, 4:20 PM

## 2018-04-04 NOTE — Progress Notes (Addendum)
Subjective/Complaints: No new complaints. Denies pain. In good spirits  ROS: limited due to language/communication    Objective: Vital Signs: Blood pressure 114/69, pulse 68, temperature 97.6 F (36.4 C), temperature source Oral, resp. rate 18, height 6' (1.829 m), weight 83.2 kg, SpO2 100 %. No results found. No results found for this or any previous visit (from the past 72 hour(s)).   Constitutional: No distress . Vital signs reviewed. HEENT: EOMI, oral membranes moist Neck: supple Cardiovascular: RRR without murmur. No JVD    Respiratory: CTA Bilaterally without wheezes or rales. Normal effort    GI: BS +, non-tender, non-distended  Musc: No edema or tenderness in extremities. Neuro:  Expressive language deficits. Comprehension improving Motor: LUE/LLE: 5/5 proximal distal RUE 0/5 with increased tone : (Stable) RLE: 2/5 proximal to distal (stable) Psych: pleasant   Skin: buttock not viewed.  Assessment/Plan: 1. Functional deficits secondary to left MCA infarct with hemorrhagic transformation status post craniectomy which require 3+ hours per day of interdisciplinary therapy in a comprehensive inpatient rehab setting. Physiatrist is providing close team supervision and 24 hour management of active medical problems listed below. Physiatrist and rehab team continue to assess barriers to discharge/monitor patient progress toward functional and medical goals. FIM: Function - Bathing Bathing activity did not occur: Refused Position: Shower Body parts bathed by patient: Right arm, Chest, Abdomen, Front perineal area, Right upper leg, Left upper leg, Left lower leg, Right lower leg, Left arm, Buttocks Body parts bathed by helper: Back, Buttocks Bathing not applicable: Back Assist Level: Touching or steadying assistance(Pt > 75%)  Function- Upper Body Dressing/Undressing What is the patient wearing?: Pull over shirt/dress Pull over shirt/dress - Perfomed by patient:  Thread/unthread left sleeve, Put head through opening, Pull shirt over trunk, Thread/unthread right sleeve Pull over shirt/dress - Perfomed by helper: Thread/unthread right sleeve Assist Level: Supervision or verbal cues Set up : To obtain clothing/put away Function - Lower Body Dressing/Undressing What is the patient wearing?: Pants, Socks, Shoes, AFO, Ted Hose Position: Other (comment)(sitting on TTB) Pants- Performed by patient: Thread/unthread left pants leg, Pull pants up/down, Thread/unthread right pants leg Pants- Performed by helper: Thread/unthread left pants leg, Thread/unthread right pants leg, Pull pants up/down Non-skid slipper socks- Performed by patient: Don/doff right sock, Don/doff left sock Non-skid slipper socks- Performed by helper: Don/doff right sock, Don/doff left sock Socks - Performed by patient: Don/doff right sock, Don/doff left sock Socks - Performed by helper: Don/doff right sock, Don/doff left sock Shoes - Performed by patient: Don/doff left shoe, Don/doff right shoe Shoes - Performed by helper: Don/doff right shoe AFO - Performed by patient: Don/doff right AFO AFO - Performed by helper: Don/doff right AFO TED Hose - Performed by helper: Don/doff right TED hose Assist for footwear: Supervision/touching assist Assist for lower body dressing: Touching or steadying assistance (Pt > 75%)  Function - Toileting Toileting activity did not occur: No continent bowel/bladder event Toileting steps completed by patient: Adjust clothing prior to toileting, Performs perineal hygiene, Adjust clothing after toileting Toileting steps completed by helper: Performs perineal hygiene, Adjust clothing after toileting Toileting Assistive Devices: Grab bar or rail Assist level: Touching or steadying assistance (Pt.75%)  Function - Air cabin crew transfer activity did not occur: Refused Toilet transfer assistive device: Elevated toilet seat/BSC over toilet,  Cane Mechanical lift: Stedy Assist level to toilet: Touching or steadying assistance (Pt > 75%) Assist level from toilet: Touching or steadying assistance (Pt > 75%)  Function - Chair/bed transfer Chair/bed transfer activity did not  occur: Safety/medical concerns Chair/bed transfer method: Squat pivot Chair/bed transfer assist level: Supervision or verbal cues Chair/bed transfer assistive device: Armrests, Cane Chair/bed transfer details: Verbal cues for sequencing, Verbal cues for technique, Verbal cues for safe use of DME/AE  Function - Locomotion: Wheelchair Will patient use wheelchair at discharge?: Yes Type: Manual Max wheelchair distance: 150 ft Assist Level: Supervision or verbal cues Assist Level: Supervision or verbal cues Assist Level: Supervision or verbal cues Turns around,maneuvers to table,bed, and toilet,negotiates 3% grade,maneuvers on rugs and over doorsills: No Function - Locomotion: Ambulation Ambulation activity did not occur: Safety/medical concerns Assistive device: Cane-quad Max distance: 61' Assist level: Touching or steadying assistance (Pt > 75%) Assist level: Touching or steadying assistance (Pt > 75%) Walk 50 feet with 2 turns activity did not occur: Safety/medical concerns Assist level: Touching or steadying assistance (Pt > 75%) Walk 150 feet activity did not occur: Safety/medical concerns Assist level: Touching or steadying assistance (Pt > 75%) Walk 10 feet on uneven surfaces activity did not occur: Safety/medical concerns  Function - Comprehension Comprehension: Auditory Comprehension assist level: Follows basic conversation/direction with extra time/assistive device  Function - Expression Expression: Verbal Expression assistive device: Communication board Expression assist level: Expresses basic 50 - 74% of the time/requires cueing 25 - 49% of the time. Needs to repeat parts of sentences.  Function - Social Interaction Social Interaction  assist level: Interacts appropriately 90% of the time - Needs monitoring or encouragement for participation or interaction.  Function - Problem Solving Problem solving assist level: Solves basic 90% of the time/requires cueing < 10% of the time  Function - Memory Memory assist level: Recognizes or recalls 75 - 89% of the time/requires cueing 10 - 24% of the time Patient normally able to recall (first 3 days only): Current season, Location of own room, Staff names and faces, That he or she is in a hospital   Medical Problem List and Plan: 1.  Right hemiparesis, dysphagia, and aphasia secondary to large left MCA infarct with hemorrhagic transformation s/p hemicraniectomy.  Continue CIR  Continue Helmet for safety OOB 2.  DVT Prophylaxis/Anticoagulation: Mechanical: Sequential compression devices, below knee Bilateral lower extremities 3. Pain Management: Tylenol as needed  -continue baclofen 10mg  qid  -continue splint/ROM 4. Mood: Consult LCSW  for evaluation and support 5. Neuropsych: This patient is not fully capable of making decisions on his own behalf. 6. Skin/Wound Care: doxycyline  for left buttock ?sebaceous cyst--continue for 7 days 7. Fluids/Electrolytes/Nutrition: Monitor I's/O's.       BMP within acceptable range on 8/23 8. Dysphagia:   Advanced to regular diet with thin liquids with supervision 9. Leucocytosis: Resolved  Felt to be reactive--ID recommends d/c antibiotic and monitor for signs of infection.   Mother with history of CLL.  WBCs 9.0 on 8/23 10. High homocysteine levels/low folate levels/boderline low B12: On high dose folic supplement, B6 and received B12 inj 7/14  . MRHFR--heterozygous /single mutation ID.  May cause abnormal clotting 11. GERD: Continue PPI.  12. Hyponatremia: Resolved  Sodium 142 on 8/23 13. Neurogenic bladder/bowel:  -incontinence bladder improved  -continue to work on time voids/training      LOS (Days) 54 A FACE TO Ronda T Swartz 04/04/2018, 9:09 AM

## 2018-04-04 NOTE — Progress Notes (Signed)
Speech Language Pathology Daily Session Note  Patient Details  Name: Xavier Villegas MRN: [id number] Date of Birth: Jul 08, 1981  Today's Date: 04/04/2018 SLP Individual Time: 1100-1200 SLP Individual Time Calculation (min): 60 min  Short Term Goals: Week 4: SLP Short Term Goal 1 (Week 4): Pt will utilize multimodal communication to express wants/needs with supervision cues in 9 out of 10 opportunities.  SLP Short Term Goal 1 - Progress (Week 4): Progressing toward goal SLP Short Term Goal 2 (Week 4): Using multimodal forms of communications pt will accurately answer questions related to safety awareness with 90% accuracy.  SLP Short Term Goal 2 - Progress (Week 4): Progressing toward goal SLP Short Term Goal 3 (Week 4): Given Mod A cues, pt will produce vowels in 8 out of 10 opportunities.  SLP Short Term Goal 3 - Progress (Week 4): Progressing toward goal SLP Short Term Goal 4 (Week 4): Given Mod A cues, pt will combine consonant with vowel in 8 out of 10 opportunities.  SLP Short Term Goal 4 - Progress (Week 4): Progressing toward goal SLP Short Term Goal 5 (Week 4): Pt will complete semi-complex problem solving tasks with supervision cues.  SLP Short Term Goal 5 - Progress (Week 4): Progressing toward goal SLP Short Term Goal 6 (Week 4): Given Max A mutlimodal cues, pt will name common objects in 5 out of 10 opportunities.  SLP Short Term Goal 6 - Progress (Week 4): Progressing toward goal  Skilled Therapeutic Interventions:Skilled ST services focused on speech skills. Pt required min-supervision A verbal cues navigating WC in hallway during the beginning and at the end of therapy. Pt's aunt was present for treatment session. SLP facilitated safety awareness given yes/no question prompts with supervision A verbal cue sto clarify response. SLP facilitated production of CV combinations with / p, b, w, l / with 75% accuracy during carrier phrase tasks. SLP facilitated demonstrated  stimulablitiy with consonant /m/ in CV with max-mod A verbal cues with carrier phrases. SLP facilitated naming of objects within stimulable CV range, pt identified "ball', and "plate" with max A fade to min A verbal cues and "whistle" with max A verbal cues in carrier phrase tasks. SLP reviewed continued progress with pt and family member, as well as answered all questions to satisfaction. Pt was left in room with call bell within reach, chair alaram set and family present.ST reccomends to continue skilled ST services.     Function:  Eating Eating                 Cognition Comprehension Comprehension assist level: Follows basic conversation/direction with extra time/assistive device  Expression Expression assistive device: Communication board Expression assist level: Expresses basic 50 - 74% of the time/requires cueing 25 - 49% of the time. Needs to repeat parts of sentences.;Expresses basic 25 - 49% of the time/requires cueing 50 - 75% of the time. Uses single words/gestures.  Social Interaction Social Interaction assist level: Interacts appropriately 90% of the time - Needs monitoring or encouragement for participation or interaction.  Problem Solving Problem solving assist level: Solves basic problems with no assist  Memory Memory assist level: Recognizes or recalls 75 - 89% of the time/requires cueing 10 - 24% of the time    Pain Pain Assessment Pain Score: 0-No pain  Therapy/Group: Individual Therapy  Laylee Schooley  [facility] 04/04/2018, 4:06 PM

## 2018-04-04 NOTE — Progress Notes (Signed)
Occupational Therapy Session Note  Patient Details  Name: Korbyn Chopin MRN: [id number] Date of Birth: November 13, 1980  Today's Date: 04/04/2018 OT Individual Time: 1300-1358 OT Individual Time Calculation (min): 58 min   Short Term Goals: Week 4:  OT Short Term Goal 1 (Week 4): STGs=LTGs secondary to upcoming discharge  Skilled Therapeutic Interventions/Progress Updates:    Pt greeted in w/c with no c/o pain and ADL needs met. Took him outside to work on balance, Rt NMR and endurance. Pt self propelled 2x laps around fountain using hemi technique, navigating over uneven surfaces with vcs for avoiding obstacles on Rt side. Sit>stand x3 at supportive brick surface (up ramp). Pt completed self PROM of hand with vcs for joint protection. OT also facilitated Rt weightbearing while pt sang/hummed to familiar music with OT. He required tactile cues for knee extension at this time. Also completed stand pivot<community bench with Mod A using quad cane. Afterwards, he required mod vcs for pathfinding way back to unit. At end of session pt was left with all needs and safety belt fastened.   Therapy Documentation Precautions:  Precautions Precautions: Fall Precaution Comments: bone flap in L abdomen, no L side lying Restrictions Weight Bearing Restrictions: No Pain: No c/o pain during session    ADL:      See Function Navigator for Current Functional Status.   Therapy/Group: Individual Therapy  Alexius Ellington A Ary Lavine 04/04/2018, 3:51 PM

## 2018-04-04 NOTE — Progress Notes (Signed)
Social Work Patient ID: Xavier Villegas, male   DOB: 08-30-1980, 37 y.o.   MRN: [id number]   CSW met with pt and spoke with his father via telephone 03-31-18 and 04-01-18 to update them on team conference discussion and that pt's targeted d/c date remains 04-06-18.  CSW will work on DME and Outpt therapy follow up, as well as paperwork father requested assistance with.  Family education is in progress.  CSW remains available to assist as needed.

## 2018-04-05 ENCOUNTER — Encounter (HOSPITAL_COMMUNITY): Payer: BLUE CROSS/BLUE SHIELD | Admitting: Psychology

## 2018-04-05 ENCOUNTER — Inpatient Hospital Stay (HOSPITAL_COMMUNITY): Payer: BLUE CROSS/BLUE SHIELD | Admitting: Physical Therapy

## 2018-04-05 ENCOUNTER — Inpatient Hospital Stay (HOSPITAL_COMMUNITY): Payer: BLUE CROSS/BLUE SHIELD | Admitting: Occupational Therapy

## 2018-04-05 ENCOUNTER — Inpatient Hospital Stay (HOSPITAL_COMMUNITY): Payer: BLUE CROSS/BLUE SHIELD

## 2018-04-05 NOTE — Consult Note (Signed)
Neuropsychological Consultation   Patient:   Osceola Depaz   DOB:   1981/01/28  MR Number:  [id number]  Location:  [facility] [id number] [place] [address] Dept: [place]: [phone]           Date of Service:   04/05/2018  Start Time:   3 PM End Time:   4 PM  Provider/Observer:  Ilean Skill, Psy.D.       Clinical Neuropsychologist       Billing Code/Service: [phone] 4 Units  Chief Complaint:    Huy Majid is a 37 year old male who has a prior history of tobacco use but otherwise in good health.  The patient was admitted on 02/19/2018 after being found down the floor by his girlfriend.  The patient was found to have aphasia with right-sided weakness.  CT of the head revealed left MCA infarct.  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  The patient underwent cerebral angio with mechanical lobectomy of left MCA with reperfusion and confirmation of acute left ICA dissection as source of emboli.  CT post procedure showed subarachnoid hemorrhage with hemorrhagic transformation and follow-up CT head done revealing progressive cytotoxic edema with slight mass-effect and midline shift.  Patient underwent left decompressive hemicraniectomy with implantation of cranial flap left middle quadrant on 7/15.  The patient is continued to have significant aphasia and right-sided hemiparesis.  Reason for Service:  The patient was referred for neuropsychological/psychological consultation due to coping and concerns about adjustment and dealing with the sudden acute loss of function.  Below is the HPI for the current admission.  HPI: Jozef Eisenbeis is a 37 year old male with history of tobacco use otherwise in good health, and was admitted on 02/19/2018 after found down on the floor by girlfriend.  History taken from chart  review and girlfriend. He was found to have aphasia with right-sided weakness and CT head reviewed, showing left MCA infarct.  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  He underwent cerebral Angio with  mechanical thrombectomy of left MCA with reperfusion and confirmation of acute left ICA dissection as source of emboli.  Question stroke due to dissection due to reports of striking his head a couple of weeks prior to admission.  CT postprocedure showed subarachnoid hemorrhage with hemorrhagic transformation and follow-up CT head done revealing progressive cytotoxic edema with slight mass-effect and midline shift.  He was found to have dilated left pupil and Dr. Saintclair Halsted was consulted for input.  Patient underwent left decompressive hemicraniectomy with implantation of cranial flap left middle quadrant on 7/15.  2D echo done showing EF of 55 to 60% with medium size mobile mass on the aspect of the aortic valve suspicious for vegetation.  Bilateral lower extreme Dopplers were negative for deep DVT.  Carotid Doppler showed total occlusion of left ICA.  He was treated with hypertonic saline and serial CCT showed evolution of large L-MCA infarct with no change in mass effect or left to right midline shift.  Mentation is improving and he was started on dysphagia 1, nectar liquids.   Dr. Linus Salmons was consulted for input on antibiotic regimen.  Blood cultures repeated and serologies for Bartonella, coxiella and antiphospholipids ordered for work up. He had did have one positive BC for staph with low grade fevers and was started on  Vanc/rocephin. TEE done today and showed no vegetation or mass on aortic valve and no aortic dissection.  Dr. Linus Salmons recommended d/c of antibiotics due to negative blood cultures  (one out of 4 positive felt to be due to contamination) and negative TEE. Elevated white count felt to be due to likely reactive and to  monitor for now.    Current Status:  The patient continues with severe expressive aphasia.  While he is able to display minimal single and 2 word statements that are generally on target for intention he is unable to produce fully formed sentences.  The patient is able to clearly express himself with head nods and other gestures.  The patient appears to have good receptive language functioning.  Memory and higher order cognitive functioning appear to be fully intact.  The patient denies any significant or severe symptoms of depression or anxiety but does acknowledge the impact the sudden change in his function is had on his mood status.  The patient does not appear to be in a significant or severe depressive state.  Behavioral Observation: Cayne Yom  presents as a 37 y.o.-year-old Right Caucasian Male who appeared his stated age. his dress was Appropriate and he was Well Groomed and his manners were Appropriate to the situation.  his participation was indicative of Appropriate and Attentive behaviors.  There were any physical disabilities noted.  he displayed an appropriate level of cooperation and motivation.     Interactions:    Active Appropriate and Attentive  Attention:   within normal limits and attention span and concentration were age appropriate  Memory:   within normal limits; recent and remote memory intact  Visuo-spatial:  not examined  Speech (Volume):  low  Speech:   non-fluent aphasia;   Thought Process:  Coherent and Relevant  Though Content:  WNL; not suicidal and not homicidal  Orientation:   Full mental status was difficult to ascertain.  The patient had difficulty expressing orientation questions due to his aphasia but he did clearly appear to be oriented and aware of what is been transpiring and recent events and  therapies.  Judgment:   Good  Planning:   Good  Affect:    Appropriate  Mood:    Depressed  Insight:   Good  Intelligence:   high   Medical History:   Past Medical History:  Diagnosis Date  . Tobacco abuse    Psychiatric History:  No prior psychiatric history reported.  Family Med/Psych History:  Family History  Problem Relation Age of Onset  . Stroke Maternal Grandmother   . Liver cancer Maternal Grandmother   . Stroke Maternal Grandfather   . Leukemia Mother        CLL/[facility]. Leretha Pol at [facility]  . Diabetes Father   . High blood pressure Father   . Liver cancer Paternal Grandmother   . Cerebral aneurysm Paternal Grandfather     Risk of Suicide/Violence: low the patient denies any suicidal or homicidal ideation.  Impression/DX:  Maxie Slovacek is a 37 year old male who has a prior history of tobacco use but otherwise in good health.  The patient was admitted on 02/19/2018 after being found down the floor by his girlfriend.  The patient was found to have aphasia with right-sided weakness.  CT of the head revealed left MCA infarct.  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  The patient underwent cerebral angio with mechanical lobectomy of left MCA with reperfusion  and confirmation of acute left ICA dissection as source of emboli.  CT post procedure showed subarachnoid hemorrhage with hemorrhagic transformation and follow-up CT head done revealing progressive cytotoxic edema with slight mass-effect and midline shift.  Patient underwent left decompressive hemicraniectomy with implantation of cranial flap left middle quadrant on 7/15.  The patient is continued to have significant aphasia and right-sided hemiparesis.  The patient continues with severe expressive aphasia.  While he is able to display minimal single and 2 word statements that are generally on target for intention he is unable to produce fully formed sentences.  The  patient is able to clearly express himself with head nods and other gestures.  The patient appears to have good receptive language functioning.  Memory and higher order cognitive functioning appear to be fully intact.  The patient denies any significant or severe symptoms of depression or anxiety but does acknowledge the impact the sudden change in his function is had on his mood status.  The patient does not appear to be in a significant or severe depressive state.  Disposition/Plan:  Have reviewed with the patient regarding symptoms to watch out for related to the potential development of significant depressive symptoms.  The patient denied any significant depressive symptoms at the current time and does appear to be coping given the significant and acute changes in overall functioning.  Diagnosis:    Acute ischemic right MCA stroke [facility]) - Plan: Ambulatory referral to Physical Medicine Rehab         Electronically Signed   _______________________ Ilean Skill, Psy.D.

## 2018-04-05 NOTE — Progress Notes (Signed)
Subjective/Complaints: Pt without new complaints.  ROS: limited due to language/communication    Objective: Vital Signs: Blood pressure 108/83, pulse 95, temperature 97.8 F (36.6 C), temperature source Oral, resp. rate 16, height 6' (1.829 m), weight 83.2 kg, SpO2 99 %. No results found. No results found for this or any previous visit (from the past 72 hour(s)).   Constitutional: No distress . Vital signs reviewed. HEENT: EOMI, oral membranes moist Neck: supple Cardiovascular: RRR without murmur. No JVD    Respiratory: CTA Bilaterally without wheezes or rales. Normal effort    GI: BS +, non-tender, non-distended  Musc: No edema or tenderness in extremities. Neuro:  Expressive language deficits. Comprehension improving Motor: LUE/LLE: 5/5 proximal distal RUE 0/5 with increased tone : (Stable) RLE: 2/5 proximal to distal (stable) Psych: pleasant   Skin: buttock not viewed.  Assessment/Plan: 1. Functional deficits secondary to left MCA infarct with hemorrhagic transformation status post craniectomy which require 3+ hours per day of interdisciplinary therapy in a comprehensive inpatient rehab setting. Physiatrist is providing close team supervision and 24 hour management of active medical problems listed below. Physiatrist and rehab team continue to assess barriers to discharge/monitor patient progress toward functional and medical goals. FIM: Function - Bathing Bathing activity did not occur: Refused Position: Shower Body parts bathed by patient: Right arm, Chest, Abdomen, Front perineal area, Right upper leg, Left upper leg, Left lower leg, Right lower leg, Left arm, Buttocks Body parts bathed by helper: Back, Buttocks Bathing not applicable: Back Assist Level: Touching or steadying assistance(Pt > 75%)  Function- Upper Body Dressing/Undressing What is the patient wearing?: Pull over shirt/dress Pull over shirt/dress - Perfomed by patient: Thread/unthread left sleeve, Put  head through opening, Pull shirt over trunk, Thread/unthread right sleeve Pull over shirt/dress - Perfomed by helper: Thread/unthread right sleeve Assist Level: Supervision or verbal cues Set up : To obtain clothing/put away Function - Lower Body Dressing/Undressing What is the patient wearing?: Pants, Socks, Shoes, AFO, Ted Hose Position: Other (comment)(sitting on TTB) Pants- Performed by patient: Thread/unthread left pants leg, Pull pants up/down, Thread/unthread right pants leg Pants- Performed by helper: Thread/unthread left pants leg, Thread/unthread right pants leg, Pull pants up/down Non-skid slipper socks- Performed by patient: Don/doff right sock, Don/doff left sock Non-skid slipper socks- Performed by helper: Don/doff right sock, Don/doff left sock Socks - Performed by patient: Don/doff right sock, Don/doff left sock Socks - Performed by helper: Don/doff right sock, Don/doff left sock Shoes - Performed by patient: Don/doff left shoe, Don/doff right shoe Shoes - Performed by helper: Don/doff right shoe AFO - Performed by patient: Don/doff right AFO AFO - Performed by helper: Don/doff right AFO TED Hose - Performed by helper: Don/doff right TED hose Assist for footwear: Supervision/touching assist Assist for lower body dressing: Touching or steadying assistance (Pt > 75%)  Function - Toileting Toileting activity did not occur: No continent bowel/bladder event Toileting steps completed by patient: Adjust clothing prior to toileting, Performs perineal hygiene, Adjust clothing after toileting Toileting steps completed by helper: Performs perineal hygiene, Adjust clothing after toileting Toileting Assistive Devices: Grab bar or rail Assist level: Touching or steadying assistance (Pt.75%)  Function - Air cabin crew transfer activity did not occur: Refused Toilet transfer assistive device: Elevated toilet seat/BSC over toilet, Cane Mechanical lift: Stedy Assist level to  toilet: Touching or steadying assistance (Pt > 75%) Assist level from toilet: Touching or steadying assistance (Pt > 75%)  Function - Chair/bed transfer Chair/bed transfer activity did not occur: Safety/medical concerns Chair/bed  transfer method: Squat pivot, Stand pivot Chair/bed transfer assist level: Touching or steadying assistance (Pt > 75%) Chair/bed transfer assistive device: Armrests Chair/bed transfer details: Verbal cues for precautions/safety  Function - Locomotion: Wheelchair Will patient use wheelchair at discharge?: Yes Type: Manual Max wheelchair distance: 150 ft Assist Level: Supervision or verbal cues Assist Level: Supervision or verbal cues Assist Level: Supervision or verbal cues Turns around,maneuvers to table,bed, and toilet,negotiates 3% grade,maneuvers on rugs and over doorsills: Yes Function - Locomotion: Ambulation Ambulation activity did not occur: Safety/medical concerns Assistive device: Cane-quad Max distance: 150 Assist level: Touching or steadying assistance (Pt > 75%) Assist level: Touching or steadying assistance (Pt > 75%) Walk 50 feet with 2 turns activity did not occur: Safety/medical concerns Assist level: Touching or steadying assistance (Pt > 75%) Walk 150 feet activity did not occur: Safety/medical concerns Assist level: Touching or steadying assistance (Pt > 75%) Walk 10 feet on uneven surfaces activity did not occur: Safety/medical concerns  Function - Comprehension Comprehension: Auditory Comprehension assist level: Follows basic conversation/direction with extra time/assistive device  Function - Expression Expression: Verbal Expression assistive device: Communication board Expression assist level: Expresses basic 50 - 74% of the time/requires cueing 25 - 49% of the time. Needs to repeat parts of sentences., Expresses basic 25 - 49% of the time/requires cueing 50 - 75% of the time. Uses single words/gestures.  Function - Social  Interaction Social Interaction assist level: Interacts appropriately 90% of the time - Needs monitoring or encouragement for participation or interaction.  Function - Problem Solving Problem solving assist level: Solves basic problems with no assist  Function - Memory Memory assist level: Recognizes or recalls 75 - 89% of the time/requires cueing 10 - 24% of the time Patient normally able to recall (first 3 days only): Current season, Location of own room, Staff names and faces, That he or she is in a hospital   Medical Problem List and Plan: 1.  Right hemiparesis, dysphagia, and aphasia secondary to large left MCA infarct with hemorrhagic transformation s/p hemicraniectomy.  Continue CIR, team conf today, ELOS 8/28  Continue Helmet for safety OOB 2.  DVT Prophylaxis/Anticoagulation: Mechanical: Sequential compression devices, below knee Bilateral lower extremities 3. Pain Management: Tylenol as needed  -continue baclofen 10mg  qid  -continue splint/ROM 4. Mood: Consult LCSW  for evaluation and support 5. Neuropsych: This patient is not fully capable of making decisions on his own behalf. 6. Skin/Wound Care: doxycyline  for left buttock ?sebaceous cyst--continue for 7 days total 7. Fluids/Electrolytes/Nutrition: Monitor I's/O's.       BMP within acceptable range on 8/23 8. Dysphagia:   Advanced to regular diet with thin liquids with supervision 9. Leucocytosis: Resolved  Felt to be reactive--ID recommends d/c antibiotic and monitor for signs of infection.   Mother with history of CLL.  WBCs 9.0 on 8/23 10. High homocysteine levels/low folate levels/boderline low B12: On high dose folic supplement, B6 and received B12 inj 7/14  . MRHFR--heterozygous /single mutation ID.  May cause abnormal clotting 11. GERD: Continue PPI.  12. Hyponatremia: Resolved  Sodium 142 on 8/23 13. Neurogenic bladder/bowel:  -incontinence bladder improved  -continue to work on time voids/training      LOS  (Days) 32 A FACE TO [place] T Jaquia Benedicto 04/05/2018, 8:28 AM

## 2018-04-05 NOTE — Progress Notes (Signed)
Speech Language Pathology Discharge Summary  Patient Details  Name: Xavier Villegas MRN: [id number] Date of Birth: October 12, 1980  Today's Date: 04/05/2018 SLP Individual Time:  -  1103-1200 / 82 mins    Skilled Therapeutic Interventions: Skilled ST services focused on cognitive skills. SLP facilitated reassessment of expressive and receptive skills utilizing WAB bedside, pt currently presents with Broca's aphasia with apraxia, detailed information listed in discharge summary. SLP provided education about results and increase receptive ability greater than expressive, but has demonstrated strong improvements overall.  Pt required supervision A verbal cues for right attention during navigation to room. Pt was left in room with call bell within reach and bed alaram set.ST reccomends to continue skilled ST services.    Patient has met 9 of 9 long term goals.  Patient to discharge at overall Min;Mod;Modified Independent;Supervision level.  Reasons goals not met:     Clinical Impression/Discharge Summary: Pt demonstrated excellent progress meeting 6 out 6 goals. Pt presents with Broca's aphasia and apraxia, supported by reassessment score from Western Aphasia Battery, demonstrating strong improvement in receptive language and mild improvements in expressive language from initial evaluation presenting with Global aphasia. Pt's receptive language abilities include comprehension of yes/no questions in functional setting with 90% accuracy, basic yes/no questions 80% accuracy, complex yes/no questions 70% accuracy, follow functional 1-2 step directions, identifying objects in a large field, and matching objects with pictures. Pt continues to exhibit difficulty in receptive language tasks without high context clues. Pt's expressive abilities include some spontaneous speech up to 3 word phrases, "yes/no", simple phrase with melodic intonation therapy, production of all vowels, CV and CVC with /b, p, w, l/  with 75% accuracy in carrier phrase tasks, stimulable for /m, k, f, t, h/, and identification of objects "ball" and utilizes alphabet board spelling at word level given initial written aid. Pt demonstrated increase in cognitive skills semi-complex problem solving (in high context situations with limiting language), safety awareness and right attention with min-supervision A verbal cues. [facility] education has been completed. Pt would continue to benefit from skilled ST services in order to maximumize functional independence and reduce burden of care requiring 24/hour supervision and outpatient skilled ST services.   Care Partner:  Caregiver Able to Provide Assistance: Yes  Type of Caregiver Assistance: Physical;Cognitive  Recommendation:  Outpatient SLP;24 hour supervision/assistance  Rationale for SLP Follow Up: Maximize functional communication;Maximize cognitive function and independence;Reduce caregiver burden   Equipment:   N/A  Reasons for discharge: Discharged from hospital   Patient/Family Agrees with Progress Made and Goals Achieved: Yes   Function:  Eating Eating         Eating Set Up Assist For: Opening containers       Cognition Comprehension Comprehension assist level: Follows basic conversation/direction with extra time/assistive device  Expression Expression assistive device: Communication board Expression assist level: Expresses basic 25 - 49% of the time/requires cueing 50 - 75% of the time. Uses single words/gestures.;Expresses basic 50 - 74% of the time/requires cueing 25 - 49% of the time. Needs to repeat parts of sentences.  Social Interaction Social Interaction assist level: Interacts appropriately 90% of the time - Needs monitoring or encouragement for participation or interaction.  Problem Solving Problem solving assist level: Solves basic problems with no assist  Memory Memory assist level: Recognizes or recalls 75 - 89% of the time/requires cueing 10 - 24% of  the time   Xavier Villegas  [facility] 04/05/2018, 4:19 PM

## 2018-04-05 NOTE — Progress Notes (Signed)
Occupational Therapy Discharge Summary  Patient Details  Name: Xavier Villegas MRN: [id number] Date of Birth: [birth year]  Today's Date: 04/05/2018 OT Individual Time: 1638-4536 OT Individual Time Calculation (min): 58 min    Patient has met 13 of 13 long term goals due to improved activity tolerance, improved balance, postural control, ability to compensate for deficits, functional use of  RIGHT lower extremity, improved attention, improved awareness and improved coordination.  Patient to discharge at overall min A with quad cane for mobility and self care with S- min A level.  Patient's care partner is independent to provide the necessary physical and cognitive assistance at discharge.    Reasons goals not met: all goals met  Recommendation:  Patient will benefit from ongoing skilled OT services in outpatient setting to continue to advance functional skills in the area of BADL and iADL.  Equipment: TTB  Reasons for discharge: treatment goals met  Patient/family agrees with progress made and goals achieved: Yes   OT Intervention: Upon entering the room, pt seated in wheelchair awaiting therapist arrival with no signs, symptoms, or c/o pain this session. Skilled OT intervention with focus on self care retraining and functional mobility with quad cane. Pt standing from wheelchair with supervision and ambulating with quad cane with steady assistance to obtain needed clothing items from closet. Pt continued to ambulate into bathroom and seated on TTB with min A. Pt doffing clothing items from seated position for safety. Pt verbalizing "hot" to let therapist know how to adjust water temperature. Pt bathing with increased time and steady assistance when standing to wash buttocks. Pt utilized figure four position to wash B feet while seated. Pt performed standing pivot transfer to R with min A to return to wheelchair. Pt engaged in sit <>stand from wheelchair at sink for dressing tasks while  utilizing hemiplegic technique. Pt required min A for dynamic standing balance during LB clothing management. Pt remained seated in wheelchair at end of session with alarm belt donned and call bell within reach. OT reviewed recommendations for discharge with pt shaking his head that he had no further questions.   OT Discharge Precautions/Restrictions  Precautions Precautions: Fall Precaution Comments: bone flap in L abdomen, no L side lying Restrictions Weight Bearing Restrictions: No Pain Pain Assessment Pain Scale: Faces Pain Score: 0-No pain Faces Pain Scale: No hurt Vision Baseline Vision/History: No visual deficits Praxis Praxis: Impaired Praxis Impairment Details: Motor planning Cognition Overall Cognitive Status: Impaired/Different from baseline Arousal/Alertness: Awake/alert Orientation Level: Other (comment)(expressive aphasia) Attention: Sustained;Selective Focused Attention: Appears intact Sustained Attention: Appears intact Selective Attention: Impaired Awareness: Impaired Awareness Impairment: Emergent impairment Problem Solving: Impaired Sensation Sensation Light Touch: Impaired by gross assessment Additional Comments: reports decreased sensation to LT in RLE compared to LLE Coordination Gross Motor Movements are Fluid and Coordinated: No Fine Motor Movements are Fluid and Coordinated: No Coordination and Movement Description: continues to have coordination deficits 2/2 R hemiparesis (UE>LE) but improving from eval Motor  Motor Motor: Motor apraxia;Abnormal postural alignment and control;Hemiplegia Motor - Discharge Observations: R hemiparesis UE>LE Mobility  Bed Mobility Bed Mobility: Supine to Sit;Sit to Supine Supine to Sit: Supervision/Verbal cueing Sit to Supine: Supervision/Verbal cueing Transfers Sit to Stand: Contact Guard/Touching assist  Trunk/Postural Assessment  Cervical Assessment Cervical Assessment: Within Functional Limits Thoracic  Assessment Thoracic Assessment: Within Functional Limits Lumbar Assessment Lumbar Assessment: Within Functional Limits Postural Control Postural Control: Deficits on evaluation  Balance Balance Balance Assessed: Yes Static Sitting Balance Static Sitting - Balance Support: Left upper  extremity supported;Feet supported Static Sitting - Level of Assistance: 6: Modified independent (Device/Increase time) Dynamic Sitting Balance Dynamic Sitting - Balance Support: Feet supported;During functional activity Dynamic Sitting - Level of Assistance: 5: Stand by assistance Static Standing Balance Static Standing - Balance Support: Left upper extremity supported;No upper extremity supported Static Standing - Level of Assistance: 5: Stand by assistance Dynamic Standing Balance Dynamic Standing - Balance Support: Left upper extremity supported;During functional activity Dynamic Standing - Level of Assistance: 4: Min assist Extremity/Trunk Assessment RUE Assessment RUE Assessment: Exceptions to [facility] General Strength Comments: 0/5 no trace of muscle activation LUE Assessment LUE Assessment: Within Functional Limits   See Function Navigator for Current Functional Status.  Darleen Crocker P 04/05/2018, 4:01 PM

## 2018-04-05 NOTE — Progress Notes (Signed)
Physical Therapy Discharge Summary  Patient Details  Name: Xavier Villegas MRN: [id number] Date of Birth: [birth year]  Today's Date: 04/05/2018 PT Individual Time: 7425-9563 PT Individual Time Calculation (min): 75 min    Patient has met 11 of 11 long term goals due to improved activity tolerance, improved balance, improved postural control, increased strength, ability to compensate for deficits, functional use of  right lower extremity, improved attention, improved awareness and improved coordination.  Patient to discharge at a wheelchair level Supervision.   Pt is able to ambulate with caregivers with min assist.  Patient's care partner is independent to provide the necessary physical assistance at discharge.   Recommendation:  Patient will benefit from ongoing skilled PT services in outpatient setting to continue to advance safe functional mobility, address ongoing impairments in balance, coordination, motor control, strength, perception, and activity tolerance, and minimize fall risk.  Equipment: small based quad cane and w/c  Reasons for discharge: treatment goals met  Patient/family agrees with progress made and goals achieved: Yes   Skilled PT intervention: No c/o pain.  Session focus on d/c assessment, stretching, falls recovery, and cognition/naming tasks.    Pt propels w/c throughout session with supervision and min verbal cues for attention to R environment.  Squat/pivot transfers with supervision and stand/pivot transfers with min guard throughout session, min verbal cues for use of RLE.  Gait training with SBQC x100 +150' with min guard and min fade to supervision cues for L weight shift and R step length.  Stair negotiation x4 steps with L handrail and min assist for forward ascent/backward descent.  PT instructed pt in floor transfer with min assist to come to floor and min assist to return to mat with RLE leading.  On floor pt engaged in seated hamstring stretch with  long sit, and lower back/hip stretch from criss/cross seated position.  Pt engaged in cognitive/numbering task with cards, 2 trials of counting 1-10 based on card number presented, pt requires min fade to max cues to name the number on the card.  Pt left in room at end of session with call bell in reach and needs met.   PT Discharge Precautions/Restrictions Fall--R inattention Vision/Perception  Vision - Assessment Additional Comments: R inattention vs visual field cut Perception Perception: Impaired Inattention/Neglect: Does not attend to right side of body;Does not attend to right visual field(improving from evaluation, but continues to have deficits) Praxis Praxis: Impaired Praxis Impairment Details: Motor planning  Cognition Overall Cognitive Status: Impaired/Different from baseline Arousal/Alertness: Awake/alert Orientation Level: Other (comment)(unable to assess 2/2 language deficit) Attention: Sustained;Selective Focused Attention: Appears intact Sustained Attention: Appears intact Selective Attention: Impaired Awareness: Impaired Awareness Impairment: Emergent impairment Problem Solving: Impaired Safety/Judgment: Impaired Comments: right inattention  Sensation Sensation Light Touch: Impaired by gross assessment Additional Comments: reports decreased sensation to LT in RLE compared to LLE Coordination Gross Motor Movements are Fluid and Coordinated: No Fine Motor Movements are Fluid and Coordinated: No Coordination and Movement Description: continues to have coordination deficits 2/2 R hemiparesis (UE>LE) but improving from eval Motor  Motor Motor: Motor apraxia;Abnormal postural alignment and control;Hemiplegia Motor - Discharge Observations: R hemiparesis UE>LE  Mobility Bed Mobility Bed Mobility: Supine to Sit;Sit to Supine Supine to Sit: Supervision/Verbal cueing Sit to Supine: Supervision/Verbal cueing Transfers Transfers: Stand Pivot Transfers;Squat Pivot  Transfers Sit to Stand: Contact Guard/Touching assist Stand Pivot Transfers: Contact Guard/Touching assist Stand Pivot Transfer Details: Verbal cues for sequencing;Verbal cues for technique;Verbal cues for precautions/safety Squat Pivot Transfers: Supervision/Verbal cueing Transfer (Assistive  device): None Locomotion  Gait Ambulation: Yes Gait Assistance: Contact Guard/Touching assist Gait Distance (Feet): 150 Feet Assistive device: Small based quad cane Gait Assistance Details: Tactile cues for weight shifting;Tactile cues for initiation;Verbal cues for precautions/safety Gait Assistance Details: min verbal cues for L weight shift and R step length Stairs / Additional Locomotion Stairs: Yes Stairs Assistance: Minimal Assistance - Patient > 75% Stair Management Technique: One rail Left Number of Stairs: 4 Height of Stairs: 6 Wheelchair Mobility Wheelchair Mobility: Yes Wheelchair Assistance: Supervision/Verbal cueing Wheelchair Propulsion: Left upper extremity;Left lower extremity Wheelchair Parts Management: Needs assistance Distance: 200  Trunk/Postural Assessment  Cervical Assessment Cervical Assessment: Within Functional Limits Thoracic Assessment Thoracic Assessment: Within Functional Limits Lumbar Assessment Lumbar Assessment: Within Functional Limits Postural Control Postural Control: Deficits on evaluation Protective Responses: insufficient  Balance Balance Balance Assessed: Yes Static Sitting Balance Static Sitting - Balance Support: Left upper extremity supported;Feet supported Static Sitting - Level of Assistance: 6: Modified independent (Device/Increase time) Dynamic Sitting Balance Dynamic Sitting - Balance Support: Feet supported;During functional activity Dynamic Sitting - Level of Assistance: 5: Stand by assistance Static Standing Balance Static Standing - Balance Support: Left upper extremity supported;No upper extremity supported Static Standing -  Level of Assistance: 5: Stand by assistance Dynamic Standing Balance Dynamic Standing - Balance Support: Left upper extremity supported;During functional activity Dynamic Standing - Level of Assistance: 4: Min assist Extremity Assessment      RLE Assessment RLE Assessment: Exceptions to [facility] Passive Range of Motion (PROM) Comments: WFL Active Range of Motion (AROM) Comments: WFL for hip flexion and knee extension, unable to complete knee flexion in sitting position in w/c General Strength Comments: at least 3/5 for hip flexion and knee extension LLE Assessment LLE Assessment: Within Functional Limits Passive Range of Motion (PROM) Comments: WFL Active Range of Motion (AROM) Comments: WFL General Strength Comments: 5/5 proximal to distal   See Function Navigator for Current Functional Status.  Michel Santee 04/05/2018, 8:58 AM

## 2018-04-05 NOTE — Progress Notes (Signed)
Team Conference Summary:  Patient meeting goals and ready to discharge home.  Dr. Naaman Plummer to contact patients father to discuss questions father has regarding care.  Updated primary RN of discussion.    Brita Romp, RN

## 2018-04-06 DIAGNOSIS — G8110 Spastic hemiplegia affecting unspecified side: Secondary | ICD-10-CM | POA: Diagnosis not present

## 2018-04-06 MED ORDER — PANTOPRAZOLE SODIUM 40 MG PO TBEC: 40.0000 mg | DELAYED_RELEASE_TABLET | Freq: Every day | ORAL | 0 refills | Status: DC

## 2018-04-06 MED ORDER — SENNOSIDES-DOCUSATE SODIUM 8.6-50 MG PO TABS: 2.0000 | ORAL_TABLET | Freq: Every evening | ORAL | Status: DC | PRN

## 2018-04-06 MED ORDER — FOLIC ACID 1 MG PO TABS: 4.0000 mg | ORAL_TABLET | Freq: Every day | ORAL | 0 refills | Status: DC

## 2018-04-06 MED ORDER — PYRIDOXINE HCL 50 MG PO TABS: 50.0000 mg | ORAL_TABLET | Freq: Every day | ORAL | 0 refills | Status: DC

## 2018-04-06 MED ORDER — BACLOFEN 10 MG PO TABS: 10.0000 mg | ORAL_TABLET | Freq: Four times a day (QID) | ORAL | 0 refills | Status: DC

## 2018-04-06 MED ORDER — CYANOCOBALAMIN 1000 MCG PO TABS: 1000.0000 ug | ORAL_TABLET | Freq: Every day | ORAL | 0 refills | Status: DC

## 2018-04-06 NOTE — Progress Notes (Signed)
Pt. and family got discharge instructions and equipment,pt. Ready to go home with family

## 2018-04-06 NOTE — Patient Care Conference (Signed)
Inpatient RehabilitationTeam Conference and Plan of Care Update Date: 04/05/2018   Time: 2:00 PM    Patient Name: Xavier Villegas      Medical Record Number: [id number]  Date of Birth: [birth year] Sex: Male         Room/Bed: 4W12C/4W12C-01 Payor Info: Payor: [place] / Plan: BCBS OTHER / Product Type: *No Product type* /    Admitting Diagnosis: CVA  Admit Date/Time:  03/01/2018  6:01 PM Admission Comments: No comment available   Primary Diagnosis:  Acute ischemic right MCA stroke (HCC) Principal Problem: Acute ischemic right MCA stroke [facility])  Patient Active Problem List   Diagnosis Date Noted  . Neurogenic bowel   . Neurogenic bladder   . Spastic hemiparesis ([place])   . 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 Cerebral infarction due to embolism of left middle cerebral artery (HCC) s/p thrombectomy 03/01/2018  . Carotid artery dissection ([place]) Left 03/01/2018  . Acute ischemic right MCA stroke ([place]) 03/01/2018  . Right hemiparesis ([place])   . Dysphagia, post-stroke   . Global aphasia   . Leukocytosis   . Folic acid deficiency 89/16/9450  . B12 deficiency 02/23/2018  . Hyperhomocysteinemia ([place]) 02/23/2018  . Smoker     Expected Discharge Date: Expected Discharge Date: 04/06/18  Team Members Present: Physician leading conference: Dr. Alger Simons Social Worker Present: Alfonse Alpers, LCSW Nurse Present: Rayetta Pigg, RN PT Present: Dwyane Dee, PT OT Present: Benay Pillow, OT SLP Present: Charolett Bumpers, SLP PPS Coordinator present : Daiva Nakayama, RN, CRRN     Current Status/Progress Goal Weekly Team Focus  Medical   finalizing medical issues for discharge. tone controlled, continence improving but still an issue esp at night  dc planning  see progress notes   Bowel/Bladder   cont/incont of bowel and bladder; lbm 8/26  cont b/b with min assist  timed toileting; hourly rounding;  assess q shift and prn   Swallow/Nutrition/ Hydration             ADL's   Supervision - min A with use of quad cane  Supervision-Min A   d/c planning   Mobility   min assist for gait with small based quad cane, supervision squat/pivot transfers, supervision w/c mobility  supervision transfers, min guard gait  d/c Wednesday, family education complete   Communication   Mod A yes/no questions , Max A verbalization   Mod A  reasssessment Broca's asphasia and apraxia, CV/ CVC /p, b, m, w/ , alphabet board    Safety/Cognition/ Behavioral Observations  Sup A  Sup A  education complete    Pain   no c/o pain  <2 out of 10  assess q shift and prn   Skin   bone flap to L abdomen, incision OTA; healed surgical incision to L side of head OTA  skin free from infection/breakdown with min assist  assess q shift and prn    Rehab Goals Patient on target to meet rehab goals: Yes Rehab Goals Revised: none *See Care Plan and progress notes for long and short-term goals.     Barriers to Discharge  Current Status/Progress Possible Resolutions Date Resolved   Physician    Medical stability        family ed, timed voids      Nursing                  PT  OT                  SLP                SW                Discharge Planning/Teaching Needs:  Pt to return to his home with his parents and aunt to provide the recommended supervision/assist needed.  Family education has been completed.   Team Discussion:  Pt is doing well medically with some medical issues that will need to be addressed as an outpt.  Pt is doing well with nursing.  Pt met goals with all three therapies.  Pt will need supervision for his cognition and mod - max assistance for communication.  Pt with more Broca's aphasia now and not global aphasia.  Revisions to Treatment Plan:  none    Continued Need for Acute Rehabilitation Level of Care: The patient requires daily medical management by a physician with  specialized training in physical medicine and rehabilitation for the following conditions: Daily direction of a multidisciplinary physical rehabilitation program to ensure safe treatment while eliciting the highest outcome that is of practical value to the patient.: Yes Daily medical management of patient stability for increased activity during participation in an intensive rehabilitation regime.: Yes Daily analysis of laboratory values and/or radiology reports with any subsequent need for medication adjustment of medical intervention for : Neurological problems   I attest that I was present, lead the team conference, and concur with the assessment and plan of the team.   Aritha Huckeba, [name] 04/06/2018, 12:55 PM

## 2018-04-06 NOTE — Discharge Summary (Signed)
Physician Discharge Summary  Patient ID: Xavier Villegas MRN: [id number] DOB/AGE: 12/01/1980 37 y.o.  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  Labs:  Basic Metabolic Panel: BMP Latest Ref Rng & Units 04/01/2018 03/21/2018 03/11/2018  Glucose 70 - 99 mg/dL 102(H) 97 93  BUN 6 - 20 mg/dL 6 19 11   Creatinine 0.61 - 1.24 mg/dL 0.72 0.66 0.59(L)  Sodium 135 - 145 mmol/L 142 139 139  Potassium 3.5 - 5.1 mmol/L 3.7 3.8 3.6  Chloride 98 - 111 mmol/L 105 104 99  CO2 22 - 32 mmol/L 29 27 31   Calcium 8.9 - 10.3 mg/dL 9.3 9.2 9.3    CBC: CBC Latest Ref Rng & Units 04/01/2018 03/21/2018 03/11/2018  WBC 4.0 - 10.5 K/uL 9.0 9.0 10.5  Hemoglobin 13.0 - 17.0 g/dL 13.9 14.4 14.7  Hematocrit 39.0 - 52.0 % 40.3 40.6 43.0  Platelets 150 - 400 K/uL 199 240 581(H)    CBG: No results for input(s): GLUCAP in the last 168 hours.  Brief HPI:   Xavier Villegas is a 37 year old male with history of tobacco use otherwise in good health; who was admitted on 02/19/2018 after found down on the floor by girlfriend.  He was found to have aphasia with right-sided weakness and CT of head showed left MCA infarct with acute dissection of left ICA with intraluminal thrombus.  He underwent cerebral Angiovist mechanical thrombectomy of left MCA with reperfusion and confirmation of acute left ICA dissection as source of emboli.  Postprocedure CT showed subarachnoid hemorrhage with hemorrhagic transformation and repeat CT showed progressive cytotoxic edema with slight mass-effect and midline  shift.  He required left decompressive hemicraniectomy on 715 by Dr. Saintclair Halsted as well as hypertonic saline for management of edema.  2D echo done showing EF of 55 to 60% with medium size mobile mass in aspect of aortic valve cyst pressures for vegetation however TEE showed no vegetation or mass on aortic valve and no aortic dissection..  Bilateral lower extreme Dopplers were negative for DVT.  Carotid Dopplers done showing total occlusion of left MCA.  Dr. Linus Salmons was consulted to perform input on antibiotic regimen and recommended DC of antibiotics due to negative work-up.  Elevated white count felt to be reactive and monitoring recommended.  Patient with high homocystine level and MRHFR heterozygous mutation identified.  He was started on high-dose folate and B6 and received B12 injection on 7/14. He was showing improvement in mentation as well as activity tolerance but was limited by right-sided weakness with aphasia and apraxia.  CIR was recommended due to functional deficits   Hospital Course: Urian Martenson was admitted to rehab 03/01/2018 for inpatient therapies to consist of PT, ST and OT at least three hours five days a week. Past admission physiatrist, therapy team and rehab RN have worked together to provide customized collaborative inpatient rehab. Crani incision is healing well and is intact without S/S of infection. Leucocytosis has been monitored and serial CBC showed that white count has normalized. No fevers or other signs of infection noted. IVF were discontinued and fluid intake was encouraged to maintain adequate  hydration status. As swallow function improved, he was advanced to regular textures with thin liquids. Hyponatremia has resolved and renal status is stable.  He developed irritation on left buttock likely due to  partially drained sebaceous cyst and moist warm compresses for ordered to help promote drainage.  Doxycycline was also added for prophylaxis and continued x7 days.  This  has resolved and the area has healed up.   Oxycodone was scheduled twice daily prior to therapy sessions to help manage headaches as well as activity tolerance.  Urinary retention has resolved with bladder training ongoing.  He continues to have occasional incontinence at night and family was educated on toileting patient prior to going to bed as well as once during the night.  Baclofen was added to help manage right spastic hemiparesis.  Sleep-wake cycle has improved.  He has made steady progress during his rehab stay and is currently at supervision to min assist level.  He will continue to receive further follow-up outpatient PT/OT/speech therapy at [facility] outpatient rehab [facility] after discharge.   Rehab course: During patient's stay in rehab weekly team conferences were held to monitor patient's progress, set goals and discuss barriers to discharge. At admission, patient required max assist with mobility and basic self care tasks. He exhibited dysphagia and needed mod cues to utilize safety swallow strategies.  He exhibited severe global aphasia with apraxia. He was able to follow one step commands with 50% accuracy which improved with max cues. He  has had improvement in activity tolerance, balance, postural control as well as ability to compensate for deficits. He has had improvement in functional use RUE  and RLE as well as improvement in awareness.  He is able to complete ADL tasks with supervision to min assist. He is able to transfer with min assist and verbal cues for RLE. He is able to ambulate 100+ 150' with min guard assist to supervision and cues for weight shifting.  Comprehension is 90% intact. He is able to answer basic Y/N questions with 80% accuracy and complex Y/N questions with 70% accuracy. He is able to complete semi complex tasks with min assist to supervision and expressive abilities limited to spontaneous speech up to 3 word phrases. Family has been present for multiple  therapy sessions and has been educated on all aspects of safety and mobility.    Disposition: Home  Diet: Regular  Special Instructions: 1.  Needs 24-hour supervision and assistance with medication management 2.  Wear helmet whenever out of home/supervised setting  Discharge Instructions    Ambulatory referral to Physical Medicine Rehab   Complete by:  As directed    1-2 weeks transitional care appt     Allergies as of 04/06/2018   No Known Allergies     Medication List    STOP taking these medications   feeding supplement (ENSURE ENLIVE) Liqd   RESOURCE THICKENUP CLEAR Powd     TAKE these medications   acetaminophen 325 MG tablet Commonly known as:  TYLENOL Take 1-2 tablets (325-650 mg total) by mouth every 4 (four) hours as needed for mild pain.   aspirin 325 MG tablet Take 1 tablet (325 mg total) by mouth daily.   baclofen 10 MG tablet Commonly known as:  LIORESAL Take 1 tablet (10 mg total) by mouth 4 (four) times daily.   cyanocobalamin 1000 MCG tablet Take 1 tablet (1,000 mcg total) by mouth daily.   folic acid 1 MG tablet Commonly known as:  FOLVITE Take  4 tablets (4 mg total) by mouth daily.   MUSCLE RUB 10-15 % Crea Apply 1 application topically 2 (two) times daily.   pantoprazole 40 MG tablet Commonly known as:  PROTONIX Take 1 tablet (40 mg total) by mouth daily.   pyridOXINE 50 MG tablet Commonly known as:  B-6 Take 1 tablet (50 mg total) by mouth daily.   senna-docusate 8.6-50 MG tablet Commonly known as:  Senokot-S Take 2 tablets by mouth at bedtime as needed for mild constipation.      Follow-up Information    Meredith Staggers, MD Follow up.   Specialty:  Physical Medicine and Rehabilitation Why:  Office will call you with follow up appointment Contact information: [address] [place] [address] [phone]        Newman Pies, MD. Call in 1 day(s).   Specialty:  Neurosurgery Why:  for follow up  appointment Contact information: [address] [place] [address] [place] [address] [phone]        Garvin Fila, MD. Call in 1 day(s).   Specialties:  Neurology, Radiology Why:  for follow up appointment Contact information: [address] [place] [address] [phone]        Josue Hector, MD Follow up.   Specialty:  Cardiology Why:  as needed. Will need follow up echocardiogram in a year per neurology Contact information: [address] [place] [address] [phone]        Jodelle Green, FNP. Go on 04/19/2018.   Specialty:  Family Medicine Why:  @ 9:30 AM (arrive at 9:15 AM and bring your insurance card and photo ID) Contact information: [address] [place] [address] [phone]           Signed: Bary Leriche 04/10/2018, 9:04 PM

## 2018-04-06 NOTE — Progress Notes (Addendum)
Social Work Discharge Note  The overall goal for the admission was met for:   Discharge location: Yes - home with father, mother, and aunt  Length of Stay: Yes - 36 days  Discharge activity level: Yes - supervision  Home/community participation: Yes   Services provided included: MD, RD, PT, OT, SLP, RN, Pharmacy, Neuropsych and SW  Financial Services: Private Insurance: Blue [employer] Blue Shield  Follow-up services arranged: Outpatient: PT/OT/ST, DME: 18'x18' basic cushion; quad cane; tub transfer bench and Patient/Family request agency HH: [facility], DME: [facility]  Comments (or additional information): Pt's family has completed family education and feel prepared to care for family at home.  CSW talked with pt, pt's father, and pt's aunt to update them on team conference discussion and to make sure father had his medical questions answered.  He talked with Dr. Swartz yesterday and that helped.  CSW also assisted with social security disability application and found pt a PCP.  Support Group information given, as well.  Family to contact CSW as needed.  Patient/Family verbalized understanding of follow-up arrangements: Yes  Individual responsible for coordination of the follow-up plan: pt's family  Confirmed correct DME delivered: Prevatt, Jennifer Capps 04/06/2018    Prevatt, Jennifer Capps 

## 2018-04-06 NOTE — Progress Notes (Signed)
Subjective/Complaints: Pt up in chair. No new issues  ROS: limited due to language/communication    Objective: Vital Signs: Blood pressure 126/89, pulse 68, temperature 98 F (36.7 C), temperature source Oral, resp. rate 16, height 6' (1.829 m), weight 83.2 kg, SpO2 99 %. No results found. No results found for this or any previous visit (from the past 72 hour(s)).   Constitutional: No distress . Vital signs reviewed. HEENT: EOMI, oral membranes moist Neck: supple Cardiovascular: RRR without murmur. No JVD    Respiratory: CTA Bilaterally without wheezes or rales. Normal effort    GI: BS +, non-tender, non-distended  Musc: No edema or tenderness in extremities. Neuro:  Expressive language deficits. Comprehension improving Motor: LUE/LLE: 5/5 proximal distal RUE 0/5 with increased tone : (stable) RLE: 2/5 HF, KE  Psych: pleasant   Skin: buttock wound healed  Assessment/Plan: 1. Functional deficits secondary to left MCA infarct with hemorrhagic transformation status post craniectomy which require 3+ hours per day of interdisciplinary therapy in a comprehensive inpatient rehab setting. Physiatrist is providing close team supervision and 24 hour management of active medical problems listed below. Physiatrist and rehab team continue to assess barriers to discharge/monitor patient progress toward functional and medical goals. FIM: Function - Bathing Bathing activity did not occur: Refused Position: Shower Body parts bathed by patient: Right arm, Chest, Abdomen, Front perineal area, Right upper leg, Left upper leg, Left lower leg, Right lower leg, Left arm, Buttocks Body parts bathed by helper: Back Bathing not applicable: Back Assist Level: Supervision or verbal cues  Function- Upper Body Dressing/Undressing What is the patient wearing?: Pull over shirt/dress Pull over shirt/dress - Perfomed by patient: Thread/unthread left sleeve, Put head through opening, Pull shirt over trunk,  Thread/unthread right sleeve Pull over shirt/dress - Perfomed by helper: Thread/unthread right sleeve Assist Level: Supervision or verbal cues Set up : To obtain clothing/put away Function - Lower Body Dressing/Undressing What is the patient wearing?: Pants, Socks, Shoes, AFO Position: Wheelchair/chair at sink Pants- Performed by patient: Thread/unthread left pants leg, Pull pants up/down, Thread/unthread right pants leg Pants- Performed by helper: Thread/unthread left pants leg, Thread/unthread right pants leg, Pull pants up/down Non-skid slipper socks- Performed by patient: Don/doff right sock, Don/doff left sock Non-skid slipper socks- Performed by helper: Don/doff right sock, Don/doff left sock Socks - Performed by patient: Don/doff right sock, Don/doff left sock Socks - Performed by helper: Don/doff right sock, Don/doff left sock Shoes - Performed by patient: Don/doff left shoe, Don/doff right shoe Shoes - Performed by helper: Don/doff right shoe AFO - Performed by patient: Don/doff right AFO AFO - Performed by helper: Don/doff right AFO TED Hose - Performed by helper: Don/doff right TED hose Assist for footwear: Supervision/touching assist Assist for lower body dressing: Touching or steadying assistance (Pt > 75%)  Function - Toileting Toileting activity did not occur: No continent bowel/bladder event Toileting steps completed by patient: Adjust clothing after toileting, Performs perineal hygiene, Adjust clothing prior to toileting Toileting steps completed by helper: Adjust clothing prior to toileting Toileting Assistive Devices: Grab bar or rail Assist level: Touching or steadying assistance (Pt.75%)  Function - Air cabin crew transfer activity did not occur: Refused Toilet transfer assistive device: Elevated toilet seat/BSC over toilet, Cane Mechanical lift: Stedy Assist level to toilet: Touching or steadying assistance (Pt > 75%) Assist level from toilet: Touching  or steadying assistance (Pt > 75%)  Function - Chair/bed transfer Chair/bed transfer activity did not occur: Safety/medical concerns Chair/bed transfer method: Squat pivot, Stand pivot(supervision  for squat/pivot) Chair/bed transfer assist level: Touching or steadying assistance (Pt > 75%) Chair/bed transfer assistive device: Armrests Chair/bed transfer details: Verbal cues for precautions/safety  Function - Locomotion: Wheelchair Will patient use wheelchair at discharge?: Yes Type: Manual Max wheelchair distance: 200 Assist Level: Supervision or verbal cues Assist Level: Supervision or verbal cues Assist Level: Supervision or verbal cues Turns around,maneuvers to table,bed, and toilet,negotiates 3% grade,maneuvers on rugs and over doorsills: Yes Function - Locomotion: Ambulation Ambulation activity did not occur: Safety/medical concerns Assistive device: Cane-quad Max distance: 150 Assist level: Touching or steadying assistance (Pt > 75%) Assist level: Touching or steadying assistance (Pt > 75%) Walk 50 feet with 2 turns activity did not occur: Safety/medical concerns Assist level: Touching or steadying assistance (Pt > 75%) Walk 150 feet activity did not occur: Safety/medical concerns Assist level: Touching or steadying assistance (Pt > 75%) Walk 10 feet on uneven surfaces activity did not occur: Safety/medical concerns  Function - Comprehension Comprehension: Auditory Comprehension assist level: Follows basic conversation/direction with extra time/assistive device  Function - Expression Expression: Verbal Expression assistive device: Communication board Expression assist level: Expresses basic 50 - 74% of the time/requires cueing 25 - 49% of the time. Needs to repeat parts of sentences., Expresses basic 25 - 49% of the time/requires cueing 50 - 75% of the time. Uses single words/gestures.  Function - Social Interaction Social Interaction assist level: Interacts appropriately  90% of the time - Needs monitoring or encouragement for participation or interaction.  Function - Problem Solving Problem solving assist level: Solves basic problems with no assist  Function - Memory Memory assist level: Recognizes or recalls 75 - 89% of the time/requires cueing 10 - 24% of the time Patient normally able to recall (first 3 days only): Current season, Location of own room, Staff names and faces, That he or she is in a hospital   Medical Problem List and Plan: 1.  Right hemiparesis, dysphagia, and aphasia secondary to large left MCA infarct with hemorrhagic transformation s/p hemicraniectomy.  Dc home today  -Patient to see Rehab MD/provider in the office for transitional care encounter in 1-2 weeks.   -spoke with father yesterday regarding several considerations  Continue Helmet for safety OOB---NS follow up as outpt for cranioplasty 2.  DVT Prophylaxis/Anticoagulation: Mechanical: Sequential compression devices, below knee Bilateral lower extremities 3. Pain Management: Tylenol as needed  -continue baclofen 10mg  qid  -continue splint/ROM 4. Mood: Consult LCSW  for evaluation and support 5. Neuropsych: This patient is not fully capable of making decisions on his own behalf. 6. Skin/Wound Care: doxycyline  for left buttock ?sebaceous cyst--healed 7. Fluids/Electrolytes/Nutrition: Monitor I's/O's.       BMP within acceptable range on 8/23 8. Dysphagia:   Advanced to regular diet with thin liquids with supervision 9. Leucocytosis: Resolved  Felt to be reactive--ID recommends d/c antibiotic and monitor for signs of infection.   Mother with history of CLL.  WBCs 9.0 on 8/23 10. High homocysteine levels/low folate levels/boderline low B12: On high dose folic supplement, B6 and received B12 inj 7/14  . MRHFR--heterozygous /single mutation ID.  May cause abnormal clotting 11. GERD: Continue PPI.  12. Hyponatremia: Resolved  Sodium 142 on 8/23 13. Neurogenic  bladder/bowel:  -incontinence bladder improved  -continue to work on time voids/training      LOS (Days) 91 A FACE TO [place] EVALUATION WAS PERFORMED  Meredith Staggers 04/06/2018, 8:53 AM

## 2018-04-06 NOTE — Discharge Instructions (Signed)
Inpatient Rehab Discharge Instructions  Xavier Villegas [facility] Discharge date and time:  04/06/18  Activities/Precautions/ Functional Status: Activity: no lifting, driving, or strenuous exercise till cleared by MD Diet: Regular.  Wound Care: keep wound clean and dry    Functional status:  ___ No restrictions     ___ Walk up steps independently _X__ 24/7 supervision/assistance   ___ Walk up steps with assistance ___ Intermittent supervision/assistance  ___ Bathe/dress independently ___ Walk with walker     _X__ Bathe/dress with supervision  ___ Walk Independently    ___ Shower independently _X__ Walk with supervision.     ___ Shower with assistance _X__ No alcohol     ___ Return to work/school ________   COMMUNITY REFERRALS UPON DISCHARGE:   Outpatient: PT     OT     ST     Agency:  [facility]                            [place]  [address]  Phone:  [phone]   Appointment Date/Time:  Wednesday, September 4th 2:00 PM (Occupational Therapy)                                                                                                     3:00 PM (Speech Therapy)                                                      Thursday, September 12th   11:15 AM (Physical Therapy) Medical Equipment/Items Ordered:  Quad cane; tub transfer bench; 18"x18" basic wheelchair cushion  Agency/Supplier:  [facility] RESOURCES FOR PATIENT/FAMILY: Support Groups:  [facility] Stroke Support Group                              Meets the second Thursday of every month (except June, July, August) from 6-7PM                              In the dayroom of [facility]                              For more information, call [place] Stroke Support Group  Meets the third Tuesday of every month                              [address]                              For more information, call [phone]  Mental Health:  Dr. Ilean Skill, Neuropsychologist                          Dr. Charm Barges office                          [phone]   Special Instructions: 1. Needs to have stand by supervision when walking.  2. No alcohol, aspirin, motrin, ibuprofen, aleve or medications containing these.  3. Be sure to urinate prior to bedtime and get up once in the middle of night for continence.     STROKE/TIA DISCHARGE INSTRUCTIONS SMOKING Cigarette smoking nearly doubles your risk of having a stroke & is the single most alterable risk factor  If you smoke or have smoked in the last 12 months, you are advised to quit smoking for your health.  Most of the excess cardiovascular risk related to smoking disappears within a year of stopping.  Ask you doctor about anti-smoking medications  [place] Quit Line: 1-800-QUIT NOW  Free Smoking Cessation Classes [phone]  CHOLESTEROL Know your levels; limit fat & cholesterol in your diet  Lipid Panel     Component Value Date/Time   CHOL 101 02/20/2018 0547   TRIG 94 02/22/2018 1745   HDL 34 (L) 02/20/2018 0547   CHOLHDL 3.0 02/20/2018 0547   VLDL 21 02/20/2018 0547   LDLCALC 46 02/20/2018 0547      Many patients benefit from treatment even if their cholesterol is at goal.  Goal: Total Cholesterol (CHOL) less than 160  Goal:  Triglycerides (TRIG) less than 150  Goal:  HDL greater than 40  Goal:  LDL (LDLCALC) less than 100   BLOOD PRESSURE American Stroke Association blood pressure target is less that 120/80 mm/Hg  Your discharge blood pressure is:  BP: 127/82  Monitor your blood pressure  Limit your salt and alcohol intake  Many individuals will require more than one medication for high blood pressure  DIABETES (A1c is a  blood sugar average for last 3 months) Goal HGBA1c is under 7% (HBGA1c is blood sugar average for last 3 months)  Diabetes: No known diagnosis of diabetes    Lab Results  Component Value Date   HGBA1C 4.9 02/20/2018     Your HGBA1c can be lowered with medications, healthy diet, and exercise.  Check your blood sugar as directed by your physician  Call your physician if you experience unexplained or low blood sugars.  PHYSICAL ACTIVITY/REHABILITATION Goal is 30 minutes at least 4 days per week  Activity: No driving, Therapies: see above Return to work: to be decided on follow up  Activity decreases your risk of heart attack and stroke and makes your heart stronger.  It helps control your weight and blood pressure; helps you relax and can improve your mood.  Participate in a regular exercise program.  Talk with your doctor about the best form of exercise for you (dancing, walking, swimming, cycling).  DIET/WEIGHT Goal is to  maintain a healthy weight  Your discharge diet is:  Diet Order           DIET DYS 3 Room service appropriate? Yes; Fluid consistency: Nectar Thick  Diet effective 1000         liquids Your height is:  Height: 6' (182.9 cm) Your current weight is: Weight: 83 kg (182 lb 15.7 oz) Your Body Mass Index (BMI) is:  BMI (Calculated): 24.81  Following the type of diet specifically designed for you will help prevent another stroke.  You are at goal weight.   Your goal Body Mass Index (BMI) is 19-24.  Healthy food habits can help reduce 3 risk factors for stroke:  High cholesterol, hypertension, and excess weight.  RESOURCES Stroke/Support Group:  Call [phone]   STROKE EDUCATION PROVIDED/REVIEWED AND GIVEN TO PATIENT Stroke warning signs and symptoms How to activate emergency medical system (call 911). Medications prescribed at discharge. Need for follow-up after discharge. Personal risk factors for stroke. Pneumonia vaccine given:  Flu vaccine given:  My  questions have been answered, the writing is legible, and I understand these instructions.  I will adhere to these goals & educational materials that have been provided to me after my discharge from the hospital.     My questions have been answered and I understand these instructions. I will adhere to these goals and the provided educational materials after my discharge from the hospital.  Patient/Caregiver Signature _______________________________ Date __________  Clinician Signature _______________________________________ Date __________  Please bring this form and your medication list with you to all your follow-up doctor's appointments.

## 2018-04-07 ENCOUNTER — Ambulatory Visit: Payer: BLUE CROSS/BLUE SHIELD | Admitting: Speech Pathology

## 2018-04-07 ENCOUNTER — Encounter: Payer: BLUE CROSS/BLUE SHIELD | Admitting: Occupational Therapy

## 2018-04-08 ENCOUNTER — Telehealth: Payer: Self-pay

## 2018-04-08 NOTE — Telephone Encounter (Addendum)
Transitional Care call Patient name: Xavier Villegas, Common) DOB: (11-10-1980)  1. Are you/is patient experiencing any problems since coming home? (NO) a. Are there any questions regarding any aspect of care? (NO) 2. Are there any questions regarding medications administration/dosing? (NO) a. Are meds being taken as prescribed? (YES) b. "Patient should review meds with caller to confirm"  3. Have there been any falls? (NO) 4. Has Home Health been to the house and/or have they contacted you? (YES) a. If not, have you tried to contact them? (NA) b. Can we help you contact them? (NA) 5. Are bowels and bladder emptying properly? (YES) a. Are there any unexpected incontinence issues? (NO) b. If applicable, is patient following bowel/bladder programs? (NA) 6. Any fevers, problems with breathing, unexpected pain? (NO) 7. Are there any skin problems or new areas of breakdown? (NO) 8. Has the patient/family member arranged specialty MD follow up (ie cardiology/neurology/renal/surgical/etc.)?  (YES) a. Can we help arrange? (NO) 9. Does the patient need any other services or support that we can help arrange? (NO) 10. Are caregivers following through as expected in assisting the patient? (YES) Has the patient quit smoking, drinking alcohol, or using drugs as recommended? (YES)  Appointment date/time (04-18-2018 / 2:00pm), arrive time (1:40pm) and who it is with here (Dr. Naaman Plummer) [place]

## 2018-04-12 ENCOUNTER — Ambulatory Visit: Payer: BLUE CROSS/BLUE SHIELD | Admitting: Physical Therapy

## 2018-04-13 ENCOUNTER — Other Ambulatory Visit: Payer: Self-pay

## 2018-04-13 ENCOUNTER — Ambulatory Visit: Payer: BLUE CROSS/BLUE SHIELD | Attending: Physical Medicine and Rehabilitation | Admitting: Speech Pathology

## 2018-04-13 ENCOUNTER — Encounter: Payer: Self-pay | Admitting: Occupational Therapy

## 2018-04-13 ENCOUNTER — Ambulatory Visit: Payer: BLUE CROSS/BLUE SHIELD | Admitting: Occupational Therapy

## 2018-04-13 DIAGNOSIS — M6281 Muscle weakness (generalized): Secondary | ICD-10-CM | POA: Diagnosis not present

## 2018-04-13 DIAGNOSIS — R4701 Aphasia: Secondary | ICD-10-CM | POA: Diagnosis not present

## 2018-04-13 DIAGNOSIS — R482 Apraxia: Secondary | ICD-10-CM | POA: Diagnosis not present

## 2018-04-13 DIAGNOSIS — R278 Other lack of coordination: Secondary | ICD-10-CM

## 2018-04-13 DIAGNOSIS — I69351 Hemiplegia and hemiparesis following cerebral infarction affecting right dominant side: Secondary | ICD-10-CM | POA: Insufficient documentation

## 2018-04-13 DIAGNOSIS — I63511 Cerebral infarction due to unspecified occlusion or stenosis of right middle cerebral artery: Secondary | ICD-10-CM

## 2018-04-13 DIAGNOSIS — R2689 Other abnormalities of gait and mobility: Secondary | ICD-10-CM | POA: Insufficient documentation

## 2018-04-13 DIAGNOSIS — M25511 Pain in right shoulder: Secondary | ICD-10-CM | POA: Diagnosis not present

## 2018-04-13 DIAGNOSIS — R262 Difficulty in walking, not elsewhere classified: Secondary | ICD-10-CM | POA: Diagnosis not present

## 2018-04-14 ENCOUNTER — Encounter: Payer: Self-pay | Admitting: Speech Pathology

## 2018-04-14 ENCOUNTER — Other Ambulatory Visit: Payer: Self-pay

## 2018-04-14 NOTE — Therapy (Signed)
[place] MAIN [facility] SERVICES [place], [place], [address] Phone: [phone]   Fax:  [phone]  Speech Language Pathology Evaluation  Patient Details  Name: Xavier Villegas MRN: [id number] Date of Birth: [birth year] Referring Provider: Bary Leriche    Encounter Date: 04/13/2018  End of Session - 04/14/18 1217    Visit Number  1    Number of Visits  25    Date for SLP Re-Evaluation  07/13/18    SLP Start Time  1500    SLP Stop Time   1550    SLP Time Calculation (min)  50 min    Activity Tolerance  Patient tolerated treatment well       Past Medical History:  Diagnosis Date  . Tobacco abuse     Past Surgical History:  Procedure Laterality Date  . CRANIOTOMY Left 02/21/2018   Procedure: DECOMPRESSIVE CRANIECTOMY with bone flap to abdomen;  Surgeon: [name], MD;  Location: [place];  Service: Neurosurgery;  Laterality: Left;  DECOMPRESSIVE CRANIECTOMY with bone flap to abdomen  . IR ANGIO INTRA EXTRACRAN SEL COM CAROTID INNOMINATE UNI R MOD SED  02/19/2018  . IR ANGIO VERTEBRAL SEL VERTEBRAL BILAT MOD SED  02/19/2018  . IR CT HEAD LTD  02/19/2018  . IR PERCUTANEOUS ART THROMBECTOMY/INFUSION INTRACRANIAL INC DIAG ANGIO  02/19/2018  . IR US GUIDE VASC ACCESS RIGHT  02/19/2018  . RADIOLOGY WITH ANESTHESIA N/A 02/19/2018   Procedure: IR WITH ANESTHESIA CODE STROKE;  Surgeon: Corrie Mckusick, DO;  Location: [facility];  Service: Anesthesiology;  Laterality: N/A;  . TEE WITHOUT CARDIOVERSION N/A 03/01/2018   Procedure: TRANSESOPHAGEAL ECHOCARDIOGRAM (TEE);  Surgeon: Sanda Klein, MD;  Location: [facility] END[facility]COPY;  Service: Cardiovascular;  Laterality: N/A;    There were no vitals filed for this visit.      SLP Evaluation [facility] - 04/14/18 0001      SLP Visit Information   SLP Received On  04/13/18    Referring Provider  LOVE, PAMELA S     Onset Date  02/19/2018    Medical Diagnosis  left MCA CVA      Subjective   Subjective   The patient is non-verbal    Patient/Family Stated Goal  Maximize functional communication      Pain Assessment   Currently in Pain?  No/denies      General Information   HPI  37 year old man with left MCA CVA 02/19/2018.  Patient received inpatient rehab services, including SLP, for 5 weeks.      Prior Functional Status   Cognitive/Linguistic Baseline  Within functional limits    Vocation  Full time employment      Cognition   Overall Cognitive Status  Difficult to assess    Difficult to assess due to  Impaired communication      Auditory Comprehension   Overall Auditory Comprehension  Impaired    Yes/No Questions  Impaired    Commands  Impaired    Conversation  Simple      Reading Comprehension   Reading Status  Impaired      Verbal Expression   Overall Verbal Expression  Impaired    Automatic Speech  Social Response    Repetition  Impaired    Level of Impairment  Word level    Naming  Impairment    Responsive  0-25% accurate    Confrontation  0-24% accurate      Written Expression   Written Expression  Exceptions to [facility]      Oral Motor/Sensory Function   Overall Oral Motor/Sensory Function  Appears within functional limits for tasks assessed      Motor Speech   Overall Motor Speech  Impaired    Respiration  Within functional limits    Phonation  Normal    Resonance  Within functional limits    Articulation  Impaired    Motor Planning  Impaired    Level of Impairment  Word    Motor Speech Errors  Groping for words    Phonation  WFL      Standardized Assessments   Standardized Assessments   Western Aphasia Battery revised        Western Aphasia Battery- Revised  Spontaneous Speech      Information content  3/10       Fluency   0/10     Comprehension     Yes/No questions  54/60        Auditory Word Recognition 42/60        Sequential Commands 34/80    Repetition   5/100     Naming    Object Naming  0/60        Word Fluency   0/20        Sentence  Completion 1/10        Responsive Speech   0/10     Aphasia Quotient  20.2/100   SLP Education - 04/14/18 1217    Education Details  Plan of care    Person(s) Educated  Patient;Parent(s)    Methods  Explanation    Comprehension  Verbalized understanding         SLP Long Term Goals - 04/14/18 1220      SLP LONG TERM GOAL #1   Title  Patient will name objects using multi-modal strategies with 80% accuracy.    Time  12    Period  Weeks    Status  New    Target Date  07/13/18      SLP LONG TERM GOAL #2   Title  Patient will read aloud or repeat words, maintaining phonemic accuracy, with 80% accuracy.      Time  12    Period  Weeks    Status  New    Target Date  07/13/18      SLP LONG TERM GOAL #3   Title  Patient will complete 3 unit processing tasks with 80% accuracy without the need of repetition of task instructions or significant delays in responding.    Time  12    Period  Weeks    Status  New      SLP LONG TERM GOAL #4   Title  Patient will demonstrate reading comprehension for words and phrases with 80% accuracy.     Time  12    Period  Weeks    Status  New    Target Date  07/13/18       Plan - 04/14/18 1219    Clinical Impression Statement  At 8 weeks post onset left MCA CVA, the patient is presenting with severe aphasia characterized by severe expressive deficits (non-verbal/non-fluent, low information content of verbal and gestural output) and moderate receptive deficits.  Per documentation, the patient has made significant improvement in receptive skills with modest improvement in expression.   The patient would benefit from continued skilled speech therapy for restorative and compensatory treatment of aphasia.     Speech Therapy Frequency  2x /  week    Duration  Other (comment)   12 weeks   Treatment/Interventions  Language facilitation;Multimodal communcation approach;Patient/family education;SLP instruction and feedback    Potential to Achieve Goals  Good     Potential Considerations  Ability to learn/carryover information;Pain level;Family/community support;Co-morbidities;Previous level of function;Cooperation/participation level;Severity of impairments;Medical prognosis    SLP Home Exercise Plan  TBD    Consulted and Agree with Plan of Care  Patient;Family member/caregiver    Family Member Consulted  Father       Patient will benefit from skilled therapeutic intervention in order to improve the following deficits and impairments:   Aphasia - Plan: SLP plan of care cert/re-cert    Problem List Patient Active Problem List   Diagnosis Date Noted  . Neurogenic bowel   . Neurogenic bladder   . Spastic hemiparesis ([place])   . 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 Cerebral infarction due to embolism of left middle cerebral artery (HCC) s/p thrombectomy 03/01/2018  . Carotid artery dissection ([place]) Left 03/01/2018  . Acute ischemic right MCA stroke ([place]) 03/01/2018  . Right hemiparesis ([place])   . Dysphagia, post-stroke   . Global aphasia   . Leukocytosis   . Folic acid deficiency 20/80/2233  . B12 deficiency 02/23/2018  . Hyperhomocysteinemia ([place]) 02/23/2018  . Smoker    Leroy Sea, MS/CCC- SLP  Lou Miner 04/14/2018, 12:32 PM  [place] MAIN [facility] SERVICES [address] [place], [place], [address] Phone: [phone]   Fax:  [phone]  Name: Markham Dumlao MRN: [id number] Date of Birth: Sep 03, 1980

## 2018-04-18 ENCOUNTER — Other Ambulatory Visit: Payer: Self-pay

## 2018-04-18 ENCOUNTER — Encounter
Payer: BLUE CROSS/BLUE SHIELD | Attending: Physical Medicine & Rehabilitation | Admitting: Physical Medicine & Rehabilitation

## 2018-04-18 ENCOUNTER — Encounter: Payer: Self-pay | Admitting: Physical Medicine & Rehabilitation

## 2018-04-18 VITALS — BP 126/83 | HR 88 | Ht 66.0 in | Wt 180.0 lb

## 2018-04-18 DIAGNOSIS — E559 Vitamin D deficiency, unspecified: Secondary | ICD-10-CM

## 2018-04-18 DIAGNOSIS — R4701 Aphasia: Secondary | ICD-10-CM

## 2018-04-18 DIAGNOSIS — Z87891 Personal history of nicotine dependence: Secondary | ICD-10-CM | POA: Diagnosis not present

## 2018-04-18 DIAGNOSIS — K592 Neurogenic bowel, not elsewhere classified: Secondary | ICD-10-CM | POA: Insufficient documentation

## 2018-04-18 DIAGNOSIS — Z9889 Other specified postprocedural states: Secondary | ICD-10-CM | POA: Diagnosis not present

## 2018-04-18 DIAGNOSIS — I69320 Aphasia following cerebral infarction: Secondary | ICD-10-CM | POA: Insufficient documentation

## 2018-04-18 DIAGNOSIS — I69391 Dysphagia following cerebral infarction: Secondary | ICD-10-CM | POA: Diagnosis not present

## 2018-04-18 DIAGNOSIS — G8191 Hemiplegia, unspecified affecting right dominant side: Secondary | ICD-10-CM

## 2018-04-18 DIAGNOSIS — I69351 Hemiplegia and hemiparesis following cerebral infarction affecting right dominant side: Secondary | ICD-10-CM | POA: Diagnosis not present

## 2018-04-18 DIAGNOSIS — I63412 Cerebral infarction due to embolism of left middle cerebral artery: Secondary | ICD-10-CM | POA: Diagnosis not present

## 2018-04-18 DIAGNOSIS — E539 Vitamin B deficiency, unspecified: Secondary | ICD-10-CM

## 2018-04-18 DIAGNOSIS — N319 Neuromuscular dysfunction of bladder, unspecified: Secondary | ICD-10-CM | POA: Insufficient documentation

## 2018-04-18 DIAGNOSIS — G8110 Spastic hemiplegia affecting unspecified side: Secondary | ICD-10-CM

## 2018-04-18 MED ORDER — FOLIC ACID 1 MG PO TABS: 4.0000 mg | ORAL_TABLET | Freq: Every day | ORAL | 0 refills | Status: DC

## 2018-04-18 MED ORDER — CYANOCOBALAMIN 1000 MCG PO TABS: 1000.0000 ug | ORAL_TABLET | Freq: Every day | ORAL | 0 refills | Status: DC

## 2018-04-18 MED ORDER — BACLOFEN 10 MG PO TABS: 10.0000 mg | ORAL_TABLET | Freq: Four times a day (QID) | ORAL | 0 refills | Status: DC

## 2018-04-18 MED ORDER — PYRIDOXINE HCL 50 MG PO TABS: 50.0000 mg | ORAL_TABLET | Freq: Every day | ORAL | 0 refills | Status: DC

## 2018-04-18 NOTE — Progress Notes (Signed)
Subjective:    Patient ID: Xavier Villegas, male    DOB: [birth year], 37 y.o.   MRN: [id number]  HPI   Mr. Kuss is here in follow-up of his left MCA infarct and subsequent craniectomy.  He was discharged from rehab about 2 weeks ago.  He has been home with his family.  He started outpatient therapies at [facility].  He is having occasional spasms on the right side still.  Apparently he stopped taking the baclofen accidentally but has not noticed a huge change without it.  He had one episode of urinary incontinence at home.  He denies any bowel incontinence.  Denies frank pain.  He is walking with his AFO and quad cane on limited basis at home.  He had his initial evaluation with speech therapy the other day.  He has not started with PT or OT yet.  Sleep has been consistent.  He denies any headaches.  He states that his mood has been positive.  He has goals of being ambulatory again and wants to improve his speech.  He does become frustrated at times when he is unable to speak as what he is accustomed to but has been receptive to work that family has done with him to improve his language.  He remains on his vitamin supplementation as advised.   Pain Inventory Average Pain 1 Pain Right Now 0 My pain is n/a  In the last 24 hours, has pain interfered with the following? General activity 2 Relation with others 0 Enjoyment of life 0 What TIME of day is your pain at its worst? evening Sleep (in general) Good  Pain is worse with: n/a Pain improves with: none Relief from Meds: no meds  Mobility use a cane how many minutes can you walk? 20 ability to climb steps?  yes do you drive?  no use a wheelchair needs help with transfers  Function employed # of hrs/week 10 -12 hour days I need assistance with the following:  feeding, dressing, bathing, toileting, meal prep, household duties and shopping  Neuro/Psych bladder control problems numbness tingling trouble  walking  Prior Studies Any changes since last visit?  no  Physicians involved in your care Any changes since last visit?  no   Family History  Problem Relation Age of Onset  . Stroke Maternal Grandmother   . Liver cancer Maternal Grandmother   . Stroke Maternal Grandfather   . Leukemia Mother        CLL/Dr. Leretha Pol at [facility]  . Diabetes Father   . High blood pressure Father   . Liver cancer Paternal Grandmother   . Cerebral aneurysm Paternal Grandfather    Social History   Socioeconomic History  . Marital status: Single    Spouse name: Not on file  . Number of children: Not on file  . Years of education: Not on file  . Highest education level: Not on file  Occupational History  . Not on file  Social Needs  . Financial resource strain: Not on file  . Food insecurity:    Worry: Not on file    Inability: Not on file  . Transportation needs:    Medical: Not on file    Non-medical: Not on file  Tobacco Use  . Smoking status: Former Smoker    Packs/day: 0.50    Types: Cigarettes    Last attempt to quit: 02/19/2018    Years since quitting: 0.1  . Smokeless tobacco: Never Used  Substance and Sexual  Activity  . Alcohol use: Yes    Alcohol/week: 14.0 standard drinks    Types: 14 Cans of beer per week  . Drug use: Never  . Sexual activity: Yes  Lifestyle  . Physical activity:    Days per week: Not on file    Minutes per session: Not on file  . Stress: Not on file  Relationships  . Social connections:    Talks on phone: Not on file    Gets together: Not on file    Attends religious service: Not on file    Active member of club or organization: Not on file    Attends meetings of clubs or organizations: Not on file    Relationship status: Not on file  Other Topics Concern  . Not on file  Social History Narrative  . Not on file   Past Surgical History:  Procedure Laterality Date  . CRANIOTOMY Left 02/21/2018   Procedure: DECOMPRESSIVE CRANIECTOMY with bone flap  to abdomen;  Surgeon: [name], MD;  Location: [facility];  Service: Neurosurgery;  Laterality: Left;  DECOMPRESSIVE CRANIECTOMY with bone flap to abdomen  . IR ANGIO INTRA EXTRACRAN SEL COM CAROTID INNOMINATE UNI R MOD SED  02/19/2018  . IR ANGIO VERTEBRAL SEL VERTEBRAL BILAT MOD SED  02/19/2018  . IR CT HEAD LTD  02/19/2018  . IR PERCUTANEOUS ART THROMBECTOMY/INFUSION INTRACRANIAL INC DIAG ANGIO  02/19/2018  . IR US GUIDE VASC ACCESS RIGHT  02/19/2018  . RADIOLOGY WITH ANESTHESIA N/A 02/19/2018   Procedure: IR WITH ANESTHESIA CODE STROKE;  Surgeon: Corrie Mckusick, DO;  Location: [place];  Service: Anesthesiology;  Laterality: N/A;  . TEE WITHOUT CARDIOVERSION N/A 03/01/2018   Procedure: TRANSESOPHAGEAL ECHOCARDIOGRAM (TEE);  Surgeon: Sanda Klein, MD;  Location: [facility] ENDOSCOPY;  Service: Cardiovascular;  Laterality: N/A;   Past Medical History:  Diagnosis Date  . Tobacco abuse    BP 126/83   Pulse 88   Ht 5\' 6"  (1.676 m)   Wt 180 lb (81.6 kg)   SpO2 98%   BMI 29.05 kg/m   Opioid Risk Score:   Fall Risk Score:  `1  Depression screen PHQ 2/9  Depression screen PHQ 2/9 04/18/2018  Decreased Interest 0  Down, Depressed, Hopeless 0  PHQ - 2 Score 0    Review of Systems  Constitutional: Negative.   HENT: Negative.   Eyes: Negative.   Respiratory: Negative.   Cardiovascular: Negative.   Gastrointestinal: Negative.   Endocrine: Negative.   Genitourinary: Negative.   Musculoskeletal: Negative.   Skin: Negative.   Allergic/Immunologic: Negative.   Neurological: Negative.   Hematological: Negative.   Psychiatric/Behavioral: Negative.   All other systems reviewed and are negative.      Objective:   Physical Exam   General: Alert and oriented x 3, No apparent distress HEENT: Head is normocephalic, atraumatic, PERRLA, EOMI, sclera anicteric, oral mucosa pink and moist, dentition intact, ext ear canals clear, cranioplasty site intact.  Edema has resolved. Neck: Supple without JVD or  lymphadenopathy Heart: Reg rate and rhythm. No murmurs rubs or gallops Chest: CTA bilaterally without wheezes, rales, or rhonchi; no distress Abdomen: Soft, non-tender, non-distended, bowel sounds positive. Extremities: No clubbing, cyanosis, or edema. Pulses are 2+ Skin: Clean and intact without signs of breakdown Neuro: Patient remains expressively aphasic although receptive components appear much improved.  He uttered a few words today which she had not been able to do consistently before.  His yes no head nods are accurate almost 100% of the  time.  He stood for me today and tended to be quite stable.  He did go into recurvatum at the right knee when walking however.  Right AFO appears to be fitting appropriately.. Sensory exam is normal. Reflexes are 2+ in all 4's.  Motor function is grossly 5/5 LUE, trace prox RUE, 1-2/5 RLE HF, to trace/absent distally.  Musculoskeletal: Full ROM, No pain with AROM or PROM in the neck, trunk, or extremities. Posture appropriate Psych: Pt's affect is appropriate. Pt is cooperative        Assessment & Plan:  Medical Problem List and Plan: 1. Right hemiparesis, dysphagia, and aphasia secondary to large left MCA infarct with hemorrhagic transformation s/p hemicraniectomy.             -continue with outpt therapies at [facility].         -discussed the importance of improving stabilizing right knee 2. Spasticity: fairly well controlled             -Resume baclofen at 20 mg twice daily.  Consider further titration depending on his exam.             -continue splint/ROM 4. High homocysteine levels/low folate levels/boderline low B12: Continue high dose folic supplement, B6 and received B12 inj 7/14 . MRHFR--heterozygous /single mutation ID.      -hematologist follow up might be required.  I recommend that he discuss with neurology first. 5 . Neurogenic bladder/bowel: improved             -incontinence bladder improved             -continue to work on time  voids/training     6. Cranioplasty:  -refer to Dr. Saintclair Halsted    -Continue helmet especially with increased ambulation.   Follow up with me in 2 months. Thirty minutes of face to face patient care time were spent during this visit. All questions were encouraged and answered.

## 2018-04-18 NOTE — Patient Instructions (Signed)
PLEASE FEEL FREE TO CALL OUR OFFICE WITH ANY PROBLEMS OR QUESTIONS ([phone])      

## 2018-04-19 ENCOUNTER — Encounter: Payer: Self-pay | Admitting: Family Medicine

## 2018-04-19 ENCOUNTER — Ambulatory Visit: Payer: BLUE CROSS/BLUE SHIELD | Admitting: Family Medicine

## 2018-04-19 VITALS — BP 122/88 | HR 75 | Temp 97.9°F | Ht 66.0 in | Wt 179.8 lb

## 2018-04-19 DIAGNOSIS — I69331 Monoplegia of upper limb following cerebral infarction affecting right dominant side: Secondary | ICD-10-CM | POA: Diagnosis not present

## 2018-04-19 DIAGNOSIS — G8110 Spastic hemiplegia affecting unspecified side: Secondary | ICD-10-CM

## 2018-04-19 DIAGNOSIS — R4701 Aphasia: Secondary | ICD-10-CM

## 2018-04-19 DIAGNOSIS — K592 Neurogenic bowel, not elsewhere classified: Secondary | ICD-10-CM

## 2018-04-19 DIAGNOSIS — I63512 Cerebral infarction due to unspecified occlusion or stenosis of left middle cerebral artery: Secondary | ICD-10-CM

## 2018-04-19 DIAGNOSIS — I63412 Cerebral infarction due to embolism of left middle cerebral artery: Secondary | ICD-10-CM

## 2018-04-19 DIAGNOSIS — N319 Neuromuscular dysfunction of bladder, unspecified: Secondary | ICD-10-CM | POA: Diagnosis not present

## 2018-04-19 NOTE — Progress Notes (Addendum)
Subjective:    Patient ID: Xavier Villegas, male    DOB: [birth year], 37 y.o.   MRN: [id number]  HPI  Patient presents to clinic to establish primary care.  He had acute ischemic left MCA infarct and subsequent craniotomy - was first admitted on 03/01/18.  He was discharged from hospital on 04/06/18. He did inpatient rehab and has now started his outpatient rehab. He has spastic hemiparesis on right side and right sided hemiplegia.   He has met with speech therapy and has upcoming out patient PT and OT evaluations.  He walks with quad cane. He wears AFO brace on right leg. He wears helmet to protect craniotomy site, still has skull bone in ABD with pending surgery to replace bone on head.   He lives at home with his family, parents are very supportive.   He has been sleeping well. Mood overall has been good, adjusting to everything that has happened. He has global aphasia and is working on speech, does have times of frustration because he cannot get words out well, but is continuing to work on this.    Patient Active Problem List   Diagnosis Date Noted  . Neurogenic bowel   . Neurogenic bladder   . Spastic hemiparesis ([place])   . 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 Cerebral infarction due to embolism of left middle cerebral artery (HCC) s/p thrombectomy 03/01/2018  . Carotid artery dissection ([place]) Left 03/01/2018  . Acute ischemic right MCA stroke ([place]) 03/01/2018  . Right hemiparesis ([place])   . Dysphagia, post-stroke   . Global aphasia   . Leukocytosis   . Folic acid deficiency 09/20/1733  . B12 deficiency 02/23/2018  . Hyperhomocysteinemia ([place]) 02/23/2018  . Smoker    Social History   Tobacco Use  . Smoking status: Former Smoker    Packs/day: 0.50    Types: Cigarettes    Last attempt to quit: 02/19/2018    Years since quitting: 0.1  . Smokeless tobacco: Never Used  Substance Use Topics  . Alcohol  use: Not Currently    Alcohol/week: 14.0 standard drinks    Types: 14 Cans of beer per week   Family History  Problem Relation Age of Onset  . Stroke Maternal Grandmother   . Liver cancer Maternal Grandmother   . Stroke Maternal Grandfather   . Leukemia Mother        CLL/[facility] Leretha Pol [facility]  . Diabetes Father   . High blood pressure Father   . Liver cancer Paternal Grandmother   . Cerebral aneurysm Paternal Grandfather    Past Surgical History:  Procedure Laterality Date  . CRANIOTOMY Left 02/21/2018   Procedure: DECOMPRESSIVE CRANIECTOMY with bone flap to abdomen;  Surgeon: [name], MD;  Location: [place];  Service: Neurosurgery;  Laterality: Left;  DECOMPRESSIVE CRANIECTOMY with bone flap to abdomen  . CRANIOTOMY N/A 05/02/2018   Procedure: RE-IMPLANTATION OF CRANIAL FLAP;  Surgeon: [name], MD;  Location: [place];  Service: Neurosurgery;  Laterality: N/A;  RE-IMPLANTATION OF CRANIAL FLAP  . IR ANGIO INTRA EXTRACRAN SEL COM CAROTID INNOMINATE UNI R MOD SED  02/19/2018  . IR ANGIO VERTEBRAL SEL VERTEBRAL BILAT MOD SED  02/19/2018  . IR CT HEAD LTD  02/19/2018  . IR PERCUTANEOUS ART THROMBECTOMY/INFUSION INTRACRANIAL INC DIAG ANGIO  02/19/2018  . IR US GUIDE VASC ACCESS RIGHT  02/19/2018  . RADIOLOGY WITH ANESTHESIA N/A 02/19/2018   Procedure: IR WITH ANESTHESIA CODE  STROKE;  Surgeon: Corrie Mckusick, DO;  Location: [place];  Service: Anesthesiology;  Laterality: N/A;  . TEE WITHOUT CARDIOVERSION N/A 03/01/2018   Procedure: TRANSESOPHAGEAL ECHOCARDIOGRAM (TEE);  Surgeon: Sanda Klein, MD;  [facility];  Service: Cardiovascular;  Laterality: N/A;   Past Medical History:  Diagnosis Date  . Aphasia   . GERD (gastroesophageal reflux disease)   . Hemiparesis ([place]) 02/2018   right side  . Hyperhomocysteinemia ([place])   . Neurogenic bladder 02/2018   04/26/18 inporving  . Spastic hemiparesis (HCC)    right side  . Stroke ([place])    Left MCA infart  . Tobacco abuse     Review  of Systems   Constitutional: Negative for chills, fatigue and fever.  HENT: Negative for congestion, ear pain, sinus pain and sore throat.   Eyes: Negative.   Respiratory: Negative for cough, shortness of breath and wheezing.   Cardiovascular: Negative for chest pain, palpitations and leg swelling.  Gastrointestinal: Negative for abdominal pain, diarrhea, nausea and vomiting. No bowel incontinence.  Genitourinary: Negative for dysuria, frequency and urgency. One episode of urinary incontinence at home, otherwise been able to get to bathroom.  Musculoskeletal: Negative for arthralgias and myalgias.  Skin: Negative for color change, pallor and rash.  Neurological: Negative for syncope, light-headedness and headaches.  Psychiatric/Behavioral: The patient is not nervous/anxious.       Objective:   Physical Exam  Constitutional: He is oriented to person, place, and time. He appears well-nourished. No distress.  HENT:  Head: Normocephalic and atraumatic.  Right Ear: External ear normal.  Left Ear: External ear normal.  Mouth/Throat: Oropharynx is clear and moist.  Eyes: Pupils are equal, round, and reactive to light. Conjunctivae and EOM are normal. Right eye exhibits no discharge. Left eye exhibits no discharge. No scleral icterus.  Neck: Neck supple. No tracheal deviation present.  Cardiovascular: Normal rate and regular rhythm.  Pulmonary/Chest: Effort normal and breath sounds normal. No respiratory distress.  Abdominal: Soft. Bowel sounds are normal. He exhibits no distension.  Musculoskeletal: He exhibits no edema.  Neurological: He is alert and oriented to person, place, and time.  Walking with quad cane. Global aphasia -- improving. Hemiplegia or right side.  Skin: Skin is warm and dry. Capillary refill takes less than 2 seconds. No pallor.  Psychiatric: He has a normal mood and affect. His behavior is normal.  Nursing note and vitals reviewed.  Vitals:   04/19/18 0922  BP:  122/88  Pulse: 75  Temp: 97.9 F (36.6 C)  SpO2: 98%      Assessment & Plan:   Declines flu vaccine in clinic today  Right hemiparesis, aphasia secondary to large left MCA infarct s/p hemicranieotomy - Continue OT/PT and speech therapy. Wear AFO brace on leg and helmet when walking to protect brain. New referral done to Dr Saintclair Halsted for follow up on cranieotomy.   Spasticity - Stable on baclofen, Continue PT/OT  Neurogenic bowel/bladder - Seems improved, continue to work on toileting training   Follow up here in 3 months for recheck. RTC sooner if any issues arise.

## 2018-04-19 NOTE — Patient Instructions (Signed)
We will call with information regarding neurosurgery referral

## 2018-04-21 ENCOUNTER — Other Ambulatory Visit: Payer: Self-pay | Admitting: Neurosurgery

## 2018-04-21 ENCOUNTER — Ambulatory Visit: Payer: BLUE CROSS/BLUE SHIELD | Admitting: Physical Therapy

## 2018-04-21 ENCOUNTER — Other Ambulatory Visit: Payer: Self-pay

## 2018-04-21 ENCOUNTER — Encounter: Payer: Self-pay | Admitting: Physical Therapy

## 2018-04-21 DIAGNOSIS — M6281 Muscle weakness (generalized): Secondary | ICD-10-CM

## 2018-04-21 DIAGNOSIS — R2689 Other abnormalities of gait and mobility: Secondary | ICD-10-CM | POA: Diagnosis not present

## 2018-04-21 DIAGNOSIS — R482 Apraxia: Secondary | ICD-10-CM | POA: Diagnosis not present

## 2018-04-21 DIAGNOSIS — I69351 Hemiplegia and hemiparesis following cerebral infarction affecting right dominant side: Secondary | ICD-10-CM | POA: Diagnosis not present

## 2018-04-21 DIAGNOSIS — R262 Difficulty in walking, not elsewhere classified: Secondary | ICD-10-CM | POA: Diagnosis not present

## 2018-04-21 DIAGNOSIS — I63511 Cerebral infarction due to unspecified occlusion or stenosis of right middle cerebral artery: Secondary | ICD-10-CM | POA: Diagnosis not present

## 2018-04-21 DIAGNOSIS — R4701 Aphasia: Secondary | ICD-10-CM | POA: Diagnosis not present

## 2018-04-21 DIAGNOSIS — M25511 Pain in right shoulder: Secondary | ICD-10-CM | POA: Diagnosis not present

## 2018-04-21 DIAGNOSIS — R278 Other lack of coordination: Secondary | ICD-10-CM | POA: Diagnosis not present

## 2018-04-21 DIAGNOSIS — R030 Elevated blood-pressure reading, without diagnosis of hypertension: Secondary | ICD-10-CM | POA: Diagnosis not present

## 2018-04-21 DIAGNOSIS — Z6827 Body mass index (BMI) 27.0-27.9, adult: Secondary | ICD-10-CM | POA: Diagnosis not present

## 2018-04-21 DIAGNOSIS — I639 Cerebral infarction, unspecified: Secondary | ICD-10-CM | POA: Diagnosis not present

## 2018-04-21 NOTE — Therapy (Signed)
[place] MAIN [facility] SERVICES [address] [place], [place], [address] Phone: [phone]   Fax:  [phone]  Physical Therapy Evaluation  Patient Details  Name: Xavier Villegas MRN: [id number] Date of Birth: [birth year] Referring Provider: Bary Leriche    Encounter Date: 04/21/2018  PT End of Session - 04/21/18 1110    Visit Number  1    Number of Visits  25    Date for PT Re-Evaluation  07/14/18    PT Start Time  1115    PT Stop Time  1215    PT Time Calculation (min)  60 min    Equipment Utilized During Treatment  Gait belt    Activity Tolerance  Patient tolerated treatment well    Behavior During Therapy  [facility] for tasks assessed/performed       Past Medical History:  Diagnosis Date  . Tobacco abuse     Past Surgical History:  Procedure Laterality Date  . CRANIOTOMY Left 02/21/2018   Procedure: DECOMPRESSIVE CRANIECTOMY with bone flap to abdomen;  Surgeon: Xavier Kos, MD;  Location: [place];  Service: Neurosurgery;  Laterality: Left;  DECOMPRESSIVE CRANIECTOMY with bone flap to abdomen  . IR ANGIO INTRA EXTRACRAN SEL COM CAROTID INNOMINATE UNI R MOD SED  02/19/2018  . IR ANGIO VERTEBRAL SEL VERTEBRAL BILAT MOD SED  02/19/2018  . IR CT HEAD LTD  02/19/2018  . IR PERCUTANEOUS ART THROMBECTOMY/INFUSION INTRACRANIAL INC DIAG ANGIO  02/19/2018  . IR US GUIDE VASC ACCESS RIGHT  02/19/2018  . RADIOLOGY WITH ANESTHESIA N/A 02/19/2018   Procedure: IR WITH ANESTHESIA CODE STROKE;  Surgeon: Xavier Mckusick, DO;  Location: [place];  Service: Anesthesiology;  Laterality: N/A;  . TEE WITHOUT CARDIOVERSION N/A 03/01/2018   Procedure: TRANSESOPHAGEAL ECHOCARDIOGRAM (TEE);  Surgeon: Xavier Klein, MD;  Location[facility];  Service: Cardiovascular;  Laterality: N/A;    There were no vitals filed for this visit.   Subjective Assessment - 04/21/18 1050    Subjective  Patient is ambulating sBA with quad cane. Patient wants to be walking on his  own and living on his own.     Patient is accompained by:  Family member    Pertinent History  Xavier Villegas  had left MCA infarct and subsequent craniectomy.  He was discharged from rehab several weeks ago. He has been home with his family  He is having occasional spasms on the right side still.Patient has Right hemiparesis, dysphagia, and aphasia secondary to large left MCA infarct with hemorrhagic transformation Villegas/p hemicraniectomy.He had acute ischemic left MCA infarct and subsequent craniotomy - was first admitted on 03/01/18.  He was discharged from hospital on 04/06/18    How long can you stand comfortably?  stand for 20 mins    How long can you walk comfortably?  10 mins    Patient Stated Goals  to walk without AD and live on his own.    Currently in Pain?  Yes    Pain Score  0-No pain         [facility] PT Assessment - 04/21/18 1052      Assessment   Medical Diagnosis  cva    Referring Provider  Xavier Villegas     Onset Date/Surgical Date  02/19/18    Hand Dominance  Right    Next MD Visit  04/27/18    Prior Therapy  yes      Precautions   Precautions  None   wears helmet  Required Braces or Orthoses  --   R AFO     Restrictions   Weight Bearing Restrictions  No      Balance Screen   Has the patient fallen in the past 6 months  No    Has the patient had a decrease in activity level because of a fear of falling?   Yes    Is the patient reluctant to leave their home because of a fear of falling?   No      Home Social worker  Private residence    Living Arrangements  Spouse/significant other    Available Help at Discharge  Family    Type of [place] - quad;Shower seat;Grab bars - toilet;Grab bars - tub/shower;Wheelchair - manual      Prior Function   Level of Independence  Independent    Vocation  Full time employment    Science writer, short day  trips    Leisure  motorcycle, spending time with girlfriend, Community education officer   Overall Cognitive Status  Difficult to assess    Difficult to assess due to  Impaired communication       PAIN: no reports of pain  POSTURE: WNL   PROM: BUE and BLE WFL  TONE: spasticity in RLE    STRENGTH:  Graded on a 0-5 scale Muscle Group Left Right  Shoulder flex 5/5 0/5  Shoulder Abd 5/5 0/5  Shoulder Ext 5/5 0/5  Shoulder IR/ER 5/5 0/5  Elbow 5/5 0/5  Wrist/hand 5/5 0/5  Hip Flex 5/5 3/5  Hip Abd 5/5 1/5  Hip Add 5/5 0/5  Hip Ext 3/5 0/5  Hip IR/ER 5/5 NT  Knee Flex 5/5 0/5  Knee Ext 5/5 -3/5  Ankle DF 5/5 0/5  Ankle PF 5/5 0/5   SENSATION: RLE shin and foot absent sensation  FUNCTIONAL MOBILITY:  Needs definite use of UE support for sit to stand transfers Supine to prone with SBA for safety  BALANCE: Static Standing Balance  Normal Able to maintain standing balance against maximal resistance   Good Able to maintain standing balance against moderate resistance   Good-/Fair+ Able to maintain standing balance against minimal resistance x  Fair Able to stand unsupported without UE support and without LOB for 1-2 min   Fair- Requires Min A and UE support to maintain standing without loss of balance   Poor+ Requires mod A and UE support to maintain standing without loss of balance   Poor Requires max A and UE support to maintain standing balance without loss     Standing Dynamic Balance  Normal Stand independently unsupported, able to weight shift and cross midline maximally   Good Stand independently unsupported, able to weight shift and cross midline moderately   Good-/Fair+ Stand independently unsupported, able to weight shift across midline minimally x  Fair Stand independently unsupported, weight shift, and reach ipsilaterally, loss of balance when crossing midline   Poor+ Able to stand with Min A and reach ipsilaterally, unable to weight shift   Poor Able to stand with  Mod A and minimally reach ipsilaterally, unable to cross midline.     GAIT: Patient has right knee hyperextension during stance phase of gait with right AFO and ambulates with LBQC and slow gait speed for short and intermediate distances  Stairs: ascending step with min assist for RLE and railing, descending steps with rail and CGA   OUTCOME MEASURES: TEST Outcome Interpretation  5 times sit<>stand 15.67sec >64 yo, >15 sec indicates increased risk for falls  10 meter walk test      .48           m/Villegas <1.0 m/Villegas indicates increased risk for falls; limited community ambulator  Timed up and Go   18.88              sec <14 sec indicates increased risk for falls  6 minute walk test      585          Feet 1000 feet is community Conservator, museum/gallery Assessment 25/56 <36/56 (100% risk for falls), 37-45 (80% risk for falls); 46-51 (>50% risk for falls); 52-55 (lower risk <25% of falls)                  Objective measurements completed on examination: See above findings.              PT Education - 04/21/18 1109    Education Details  plan of care    Person(Villegas) Educated  Patient;Parent(Villegas)    Methods  Explanation    Comprehension  Verbalized understanding       PT Short Term Goals - 04/21/18 1443      PT SHORT TERM GOAL #1   Title  Patient will be independent in home exercise program to improve strength/mobility for better functional independence with ADLs.    Time  6    Period  Weeks    Status  New    Target Date  06/02/18      PT SHORT TERM GOAL #2   Title  Patient (< 57 years old) will complete five times sit to stand test in < 10 seconds indicating an increased LE strength and improved balance.    Baseline  15.67 sec    Time  6    Period  Weeks    Status  New    Target Date  06/02/18      PT SHORT TERM GOAL #3   Title  Patient will reduce timed up and go to <11 seconds to reduce fall risk and demonstrate improved transfer/gait ability.    Baseline  18.88 sec     Time  6    Period  Weeks    Status  New    Target Date  06/02/18        PT Long Term Goals - 04/21/18 1446      PT LONG TERM GOAL #1   Title  Patient will increase six minute walk test distance to >1000 for progression to community ambulator and improve gait ability    Baseline  585 feet    Time  12    Period  Weeks    Status  New    Target Date  07/14/18      PT LONG TERM GOAL #2   Title  Patient will increase 10 meter walk test to >1.73m/Villegas as to improve gait speed for better community ambulation and to reduce fall risk.    Baseline  .48 m/sec    Time  12    Period  Weeks    Status  New    Target Date  07/14/18      PT LONG TERM GOAL #3   Title  Patient will increase Berg Balance score by > 6 points  to demonstrate decreased fall risk during functional activities.    Baseline  25/56    Time  12    Period  Weeks    Status  New    Target Date  07/14/18      PT LONG TERM GOAL #4   Title  Patient will be require no assist with ascend/descend 3 steps using Least restrictive assistive device.    Baseline  needs rail and cues for sequencing and min assist for RLE ascending the step    Time  12    Period  Weeks    Status  New    Target Date  07/14/18             Plan - 04/21/18 1111    Clinical Impression Statement  Patient is 36 year old male with left MCA infarct and subsequent craniectomy 02/19/18. He has flaccid RUE, and weak RLE with 3/5 hip flex, 1/5 hip abd. He has 0/5 right ankle strength but was able to DF his right ankle x 3 reps initially and then unable.Patient has spasticity in RLE and some increased tone. He has decreased sensation RLE shin and foot.  Patients LLE strength is  WNL except left hip extension 3/5. He transfers from wc to stand with [facility] and VC for correct hand placement and SBA.  He ambulates with LBQC , SBA for short and intermediate distances with RLE circumduction during swing phase of gait , R AFO, right sided neglect and decreased gait  speed with step- to gait pattern. He ascends steps with rail , VC for correct sequencing and min assist to clear R foot over the step.  He has fair + static standing balance and fair + dynamic standing balance. He has decreased Berg balance test, 6 MW  test, and 5 x sit to stand. He is returning to hospital next week to have surgery for his skin flap.Marland Kitchen He will benefit from skilled PT to improve strength, mobility, balance, gait and improve quality of life.     Clinical Presentation  Evolving    Clinical Presentation due to:  surgery next week to return scalp to head    Clinical Decision Making  Moderate    PT Frequency  2x / week    PT Duration  12 weeks    PT Treatment/Interventions  Manual techniques;Patient/family education;Neuromuscular re-education;Balance training;Functional mobility training;Therapeutic activities;Therapeutic exercise;Gait training    PT Next Visit Plan  strengthening    Consulted and Agree with Plan of Care  Patient       Patient will benefit from skilled therapeutic intervention in order to improve the following deficits and impairments:  Abnormal gait, Decreased balance, Decreased endurance, Decreased mobility, Difficulty walking, Impaired sensation, Increased muscle spasms, Impaired UE functional use, Impaired flexibility, Decreased strength, Decreased coordination, Decreased activity tolerance  Visit Diagnosis: Muscle weakness (generalized) - Plan: PT plan of care cert/re-cert  Difficulty in walking, not elsewhere classified - Plan: PT plan of care cert/re-cert  Other abnormalities of gait and mobility - Plan: PT plan of care cert/re-cert     Problem List Patient Active Problem List   Diagnosis Date Noted  . Neurogenic bowel   . Neurogenic bladder   . Spastic hemiparesis ([place])   . 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 Cerebral infarction due to embolism of left middle cerebral artery (HCC) Villegas/p  thrombectomy 03/01/2018  . Carotid artery dissection ([place]) Left 03/01/2018  . Acute ischemic right MCA  stroke ([place]) 03/01/2018  . Right hemiparesis ([place])   . Dysphagia, post-stroke   . Global aphasia   . Leukocytosis   . Folic acid deficiency 00/17/4944  . B12 deficiency 02/23/2018  . Hyperhomocysteinemia ([place]) 02/23/2018  . Smoker     [place], [place], [place] DPT 04/21/2018, 4:09 PM  [place] MAIN [facility] SERVICES [address] [place], [place], [address] Phone: [phone]   Fax:  [phone]  Name: Xavier Villegas MRN: [id number] Date of Birth: 02-Mar-1981

## 2018-04-22 ENCOUNTER — Ambulatory Visit: Payer: BLUE CROSS/BLUE SHIELD | Admitting: Occupational Therapy

## 2018-04-22 ENCOUNTER — Ambulatory Visit: Payer: BLUE CROSS/BLUE SHIELD

## 2018-04-22 ENCOUNTER — Encounter: Payer: Self-pay | Admitting: Speech Pathology

## 2018-04-22 ENCOUNTER — Ambulatory Visit: Payer: BLUE CROSS/BLUE SHIELD | Admitting: Speech Pathology

## 2018-04-22 DIAGNOSIS — R482 Apraxia: Secondary | ICD-10-CM | POA: Diagnosis not present

## 2018-04-22 DIAGNOSIS — M6281 Muscle weakness (generalized): Secondary | ICD-10-CM

## 2018-04-22 DIAGNOSIS — M25511 Pain in right shoulder: Secondary | ICD-10-CM

## 2018-04-22 DIAGNOSIS — R278 Other lack of coordination: Secondary | ICD-10-CM | POA: Diagnosis not present

## 2018-04-22 DIAGNOSIS — R4701 Aphasia: Secondary | ICD-10-CM | POA: Diagnosis not present

## 2018-04-22 DIAGNOSIS — R262 Difficulty in walking, not elsewhere classified: Secondary | ICD-10-CM

## 2018-04-22 DIAGNOSIS — R2689 Other abnormalities of gait and mobility: Secondary | ICD-10-CM | POA: Diagnosis not present

## 2018-04-22 DIAGNOSIS — I69351 Hemiplegia and hemiparesis following cerebral infarction affecting right dominant side: Secondary | ICD-10-CM | POA: Diagnosis not present

## 2018-04-22 DIAGNOSIS — I63511 Cerebral infarction due to unspecified occlusion or stenosis of right middle cerebral artery: Secondary | ICD-10-CM | POA: Diagnosis not present

## 2018-04-22 NOTE — Therapy (Signed)
[place] MAIN [facility] SERVICES [place], [place], [address] Phone: [phone]   Fax:  [phone]  Speech Language Pathology Treatment  Patient Details  Name: Xavier Villegas MRN: [id number] Date of Birth: [birth year] Referring Provider: Bary Leriche    Encounter Date: 04/22/2018  End of Session - 04/22/18 0909    Visit Number  2    Number of Visits  25    Date for SLP Re-Evaluation  07/13/18    SLP Start Time  0755    SLP Stop Time   0850    SLP Time Calculation (min)  55 min    Activity Tolerance  Patient tolerated treatment well       Past Medical History:  Diagnosis Date  . Tobacco abuse     Past Surgical History:  Procedure Laterality Date  . CRANIOTOMY Left 02/21/2018   Procedure: DECOMPRESSIVE CRANIECTOMY with bone flap to abdomen;  Surgeon: [name], MD;  Location: [place];  Service: Neurosurgery;  Laterality: Left;  DECOMPRESSIVE CRANIECTOMY with bone flap to abdomen  . IR ANGIO INTRA EXTRACRAN SEL COM CAROTID INNOMINATE UNI R MOD SED  02/19/2018  . IR ANGIO VERTEBRAL SEL VERTEBRAL BILAT MOD SED  02/19/2018  . IR CT HEAD LTD  02/19/2018  . IR PERCUTANEOUS ART THROMBECTOMY/INFUSION INTRACRANIAL INC DIAG ANGIO  02/19/2018  . IR US GUIDE VASC ACCESS RIGHT  02/19/2018  . RADIOLOGY WITH ANESTHESIA N/A 02/19/2018   Procedure: IR WITH ANESTHESIA CODE STROKE;  Surgeon: Corrie Mckusick, DO;  Location: [place];  Service: Anesthesiology;  Laterality: N/A;  . TEE WITHOUT CARDIOVERSION N/A 03/01/2018   Procedure: TRANSESOPHAGEAL ECHOCARDIOGRAM (TEE);  Surgeon: Sanda Klein, MD;  Location: [facility] ENDOSCOPY;  Service: Cardiovascular;  Laterality: N/A;    There were no vitals filed for this visit.  Subjective Assessment - 04/22/18 0909    Subjective  "that too"            ADULT SLP TREATMENT - 04/22/18 0001      General Information   Behavior/Cognition  Alert;Cooperative;Pleasant mood    HPI   37 year old man with  left MCA CVA 02/19/2018.  Patient received inpatient rehab services, including SLP, for 5 weeks.       Treatment Provided   Treatment provided  Cognitive-Linquistic      Pain Assessment   Pain Assessment  No/denies pain      Cognitive-Linquistic Treatment   Treatment focused on  Aphasia;Apraxia    Skilled Treatment  SPEECH: Patient able to consistently hum in unison and to command.  Hum into "me" with 70% accuracy and hum into "moo" sporadically.  Patient was not able to hum into other vowels this session.  COMPREHENSION:  Answer 3-unit yes no questions with 95% accuracy.  Follow 3-unit body part commands with mod to max cues for comprehension and limb/oral apraxia.  Identify stated picture (f=10) with 70% accuracy.  Identify picture given gestured use with 80% accuracy.  Match word to picture with 80% accuracy.      Assessment / Recommendations / Plan   Plan  Continue with current plan of care      Progression Toward Goals   Progression toward goals  Progressing toward goals       SLP Education - 04/22/18 0909    Education Details  multi-modal communication    Person(s) Educated  Patient    Methods  Explanation    Comprehension  Verbalized understanding;Need further instruction  SLP Long Term Goals - 04/14/18 1220      SLP LONG TERM GOAL #1   Title  Patient will name objects using multi-modal strategies with 80% accuracy.    Time  12    Period  Weeks    Status  New    Target Date  07/13/18      SLP LONG TERM GOAL #2   Title  Patient will read aloud or repeat words, maintaining phonemic accuracy, with 80% accuracy.      Time  12    Period  Weeks    Status  New    Target Date  07/13/18      SLP LONG TERM GOAL #3   Title  Patient will complete 3 unit processing tasks with 80% accuracy without the need of repetition of task instructions or significant delays in responding.    Time  12    Period  Weeks    Status  New      SLP LONG TERM GOAL #4   Title  Patient  will demonstrate reading comprehension for words and phrases with 80% accuracy.     Time  12    Period  Weeks    Status  New    Target Date  07/13/18       Plan - 04/22/18 0910    Clinical Impression Statement  Patient is alert and eager to work on his communication.  He demonstrates good comprehension for moderately complex yes/no questions but has more difficulty with more complex verbal information.  Patient was stimulable for sustained hum (/m/) and initial /m/ syllables.    Speech Therapy Frequency  2x / week    Duration  Other (comment)    Treatment/Interventions  Language facilitation;Multimodal communcation approach;Patient/family education;SLP instruction and feedback    Potential to Achieve Goals  Good    Potential Considerations  Ability to learn/carryover information;Pain level;Family/community support;Co-morbidities;Previous level of function;Cooperation/participation level;Severity of impairments;Medical prognosis    Consulted and Agree with Plan of Care  Patient       Patient will benefit from skilled therapeutic intervention in order to improve the following deficits and impairments:   Aphasia  Apraxia    Problem List Patient Active Problem List   Diagnosis Date Noted  . Neurogenic bowel   . Neurogenic bladder   . Spastic hemiparesis ([place])   . 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 Cerebral infarction due to embolism of left middle cerebral artery (HCC) s/p thrombectomy 03/01/2018  . Carotid artery dissection ([place]) Left 03/01/2018  . Acute ischemic right MCA stroke ([place]) 03/01/2018  . Right hemiparesis ([place])   . Dysphagia, post-stroke   . Global aphasia   . Leukocytosis   . Folic acid deficiency 51/88/4166  . B12 deficiency 02/23/2018  . Hyperhomocysteinemia ([place]) 02/23/2018  . Smoker    Leroy Sea, MS/CCC- SLP  Lou Miner 04/22/2018, 9:12 AM  [place] MAIN [facility] SERVICES [address] [place], [place], [address] Phone: [phone]   Fax:  [phone]   Name: Xavier Villegas MRN: [id number] Date of Birth: [birth year]

## 2018-04-22 NOTE — Therapy (Signed)
[name] MAIN [facility] SERVICES [address] [place], [place], [address] Phone: [phone]   Fax:  [phone]  Physical Therapy Treatment  Patient Details  Name: Xavier Villegas MRN: [id number] Date of Birth: April 17, 1981 Referring Provider: Bary Leriche    Encounter Date: 04/22/2018  PT End of Session - 04/22/18 2120    Visit Number  2    Number of Visits  25    Date for PT Re-Evaluation  07/14/18    PT Start Time  1000    PT Stop Time  1045    PT Time Calculation (min)  45 min    Equipment Utilized During Treatment  Gait belt    Activity Tolerance  Patient tolerated treatment well    Behavior During Therapy  [facility] for tasks assessed/performed       Past Medical History:  Diagnosis Date  . Tobacco abuse     Past Surgical History:  Procedure Laterality Date  . CRANIOTOMY Left 02/21/2018   Procedure: DECOMPRESSIVE CRANIECTOMY with bone flap to abdomen;  Surgeon: [name], MD;  Location: [place];  Service: Neurosurgery;  Laterality: Left;  DECOMPRESSIVE CRANIECTOMY with bone flap to abdomen  . IR ANGIO INTRA EXTRACRAN SEL COM CAROTID INNOMINATE UNI R MOD SED  02/19/2018  . IR ANGIO VERTEBRAL SEL VERTEBRAL BILAT MOD SED  02/19/2018  . IR CT HEAD LTD  02/19/2018  . IR PERCUTANEOUS ART THROMBECTOMY/INFUSION INTRACRANIAL INC DIAG ANGIO  02/19/2018  . IR US GUIDE VASC ACCESS RIGHT  02/19/2018  . RADIOLOGY WITH ANESTHESIA N/A 02/19/2018   Procedure: IR WITH ANESTHESIA CODE STROKE;  Surgeon: Corrie Mckusick, DO;  Location: [place];  Service: Anesthesiology;  Laterality: N/A;  . TEE WITHOUT CARDIOVERSION N/A 03/01/2018   Procedure: TRANSESOPHAGEAL ECHOCARDIOGRAM (TEE);  Surgeon: Sanda Klein, MD;  Location: [facility] ENDOSCOPY;  Service: Cardiovascular;  Laterality: N/A;    There were no vitals filed for this visit.  Subjective Assessment - 04/22/18 2118    Subjective  Patient reports no falls since evaluation. Father present with him during  session. Patient interactive and engaged throughout session. Denies any pain, will have surgery next week , potentially wednesday to replace bone flap.     Patient is accompained by:  Family member    Pertinent History  Xavier Villegas  had left MCA infarct and subsequent craniectomy.  He was discharged from rehab several weeks ago. He has been home with his family  He is having occasional spasms on the right side still.Patient has Right hemiparesis, dysphagia, and aphasia secondary to large left MCA infarct with hemorrhagic transformation s/p hemicraniectomy.He had acute ischemic left MCA infarct and subsequent craniotomy - was first admitted on 03/01/18.  He was discharged from hospital on 04/06/18    How long can you stand comfortably?  stand for 20 mins    How long can you walk comfortably?  10 mins    Patient Stated Goals  to walk without AD and live on his own.    Currently in Pain?  No/denies      Side step RTB 4x length of // bars cues for keeping toes in line  Weight shift over RLE with yellow dyna disc under LLE 4 minutes  Airex pad: no UE support 60 seconds, horizontal head turns 60 seconds, vertical head turns 60 seconds   Balloons 4 minutes reaching inside and outside BOS  Step over and back line 10x each leg ; cues for hip flexion to improve step back  clearance ; Min A to RLE with LLE stepping to prevent hyperextension  LAQ 10x RLE   R knee march 10x with PT assist with body alignment, cues for abdominal activation  TrA activation 10x 3 second holds   Weight shift forwards/backwards. To promote offloading and onloading phase of gait and reduce incidences of knee hyperextension.   Vitals taken at end of session due to headache:   130/85 pulse 103    Use of hands signals: for easy: hand at hip; medium: hand at chest: hard: hand at head   Patient requires CGA and AFO with all standing interventions and occasional stabilization of RLE to prevent knee hyperextension with  weightbearing.                         PT Education - 04/22/18 2120    Education Details  adding LAQ, standing weight shifts, and balloon taps to HEP.     Person(s) Educated  Patient    Methods  Explanation;Demonstration;Verbal cues    Comprehension  Verbalized understanding;Returned demonstration       PT Short Term Goals - 04/21/18 1443      PT SHORT TERM GOAL #1   Title  Patient will be independent in home exercise program to improve strength/mobility for better functional independence with ADLs.    Time  6    Period  Weeks    Status  New    Target Date  06/02/18      PT SHORT TERM GOAL #2   Title  Patient (< 48 years old) will complete five times sit to stand test in < 10 seconds indicating an increased LE strength and improved balance.    Baseline  15.67 sec    Time  6    Period  Weeks    Status  New    Target Date  06/02/18      PT SHORT TERM GOAL #3   Title  Patient will reduce timed up and go to <11 seconds to reduce fall risk and demonstrate improved transfer/gait ability.    Baseline  18.88 sec    Time  6    Period  Weeks    Status  New    Target Date  06/02/18        PT Long Term Goals - 04/21/18 1446      PT LONG TERM GOAL #1   Title  Patient will increase six minute walk test distance to >1000 for progression to community ambulator and improve gait ability    Baseline  585 feet    Time  12    Period  Weeks    Status  New    Target Date  07/14/18      PT LONG TERM GOAL #2   Title  Patient will increase 10 meter walk test to >1.62m/s as to improve gait speed for better community ambulation and to reduce fall risk.    Baseline  .48 m/sec    Time  12    Period  Weeks    Status  New    Target Date  07/14/18      PT LONG TERM GOAL #3   Title  Patient will increase Berg Balance score by > 6 points to demonstrate decreased fall risk during functional activities.    Baseline  25/56    Time  12    Period  Weeks    Status  New     Target Date  07/14/18      PT LONG TERM GOAL #4   Title  Patient will be require no assist with ascend/descend 3 steps using Least restrictive assistive device.    Baseline  needs rail and cues for sequencing and min assist for RLE ascending the step    Time  12    Period  Weeks    Status  New    Target Date  07/14/18            Plan - 04/22/18 2121    Clinical Impression Statement  Patient demonstrates good static balance however is challenged with dynamic mobility due to limited stability in RLE. Patient requires CGA with all standing interventions and occasional stabilization of RLE to prevent knee hyperextension with weightbearing.  Patient educated on adding Long arc quad for quad strengthening to HEP. Due to patient having difficulty expressing/communicating via words use of hand signals for patient to relay easy, medium, or hard levels of interventions. Patient fully aware and follows complex commands and conversations, however is unable to respond. Patient will benefit from skilled PT to improve strength, mobility, balance, gait and improve quality of life.     Rehab Potential  Good    PT Frequency  2x / week    PT Duration  12 weeks    PT Treatment/Interventions  Manual techniques;Patient/family education;Neuromuscular re-education;Balance training;Functional mobility training;Therapeutic activities;Therapeutic exercise;Gait training;Orthotic Fit/Training;Passive range of motion    PT Next Visit Plan  strengthening, balance, gait training    PT Home Exercise Plan  standing abd bilaterally x 15    Consulted and Agree with Plan of Care  Patient;Family member/caregiver       Patient will benefit from skilled therapeutic intervention in order to improve the following deficits and impairments:  Abnormal gait, Decreased balance, Decreased endurance, Decreased mobility, Difficulty walking, Impaired sensation, Increased muscle spasms, Impaired UE functional use, Impaired flexibility,  Decreased strength, Decreased coordination, Decreased activity tolerance  Visit Diagnosis: Muscle weakness (generalized)  Difficulty in walking, not elsewhere classified     Problem List Patient Active Problem List   Diagnosis Date Noted  . Neurogenic bowel   . Neurogenic bladder   . Spastic hemiparesis ([place])   . 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 Cerebral infarction due to embolism of left middle cerebral artery (HCC) s/p thrombectomy 03/01/2018  . Carotid artery dissection ([employer]) Left 03/01/2018  . Acute ischemic right MCA stroke ([place]) 03/01/2018  . Right hemiparesis ([place])   . Dysphagia, post-stroke   . Global aphasia   . Leukocytosis   . Folic acid deficiency 76/16/0737  . B12 deficiency 02/23/2018  . Hyperhomocysteinemia ([place]) 02/23/2018  . Smoker    Janna Arch, PT, DPT   04/22/2018, 9:23 PM  [place] MAIN [facility] SERVICES [address] [place], [place], [address] Phone: [phone]   Fax:  [phone]  Name: Xavier Villegas MRN: [id number] Date of Birth: 08-15-1980

## 2018-04-23 ENCOUNTER — Encounter: Payer: Self-pay | Admitting: Occupational Therapy
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
## 2018-04-25 ENCOUNTER — Emergency Department (HOSPITAL_COMMUNITY)
Admission: EM | Admit: 2018-04-25 | Discharge: 2018-04-25 | Disposition: A | Discharge status: Home / Self Care | Payer: BLUE CROSS/BLUE SHIELD | Attending: Emergency Medicine | Admitting: Emergency Medicine

## 2018-04-25 ENCOUNTER — Encounter (HOSPITAL_COMMUNITY): Payer: Self-pay | Admitting: *Deleted

## 2018-04-25 ENCOUNTER — Emergency Department (HOSPITAL_COMMUNITY): Payer: BLUE CROSS/BLUE SHIELD

## 2018-04-25 DIAGNOSIS — Z87891 Personal history of nicotine dependence: Secondary | ICD-10-CM | POA: Diagnosis not present

## 2018-04-25 DIAGNOSIS — L089 Local infection of the skin and subcutaneous tissue, unspecified: Secondary | ICD-10-CM | POA: Diagnosis not present

## 2018-04-25 DIAGNOSIS — R000 Tachycardia, unspecified: Secondary | ICD-10-CM | POA: Diagnosis not present

## 2018-04-25 DIAGNOSIS — Z7982 Long term (current) use of aspirin: Secondary | ICD-10-CM | POA: Insufficient documentation

## 2018-04-25 DIAGNOSIS — L02212 Cutaneous abscess of back [any part, except buttock]: Secondary | ICD-10-CM | POA: Diagnosis not present

## 2018-04-25 DIAGNOSIS — L989 Disorder of the skin and subcutaneous tissue, unspecified: Secondary | ICD-10-CM | POA: Diagnosis not present

## 2018-04-25 DIAGNOSIS — Z79899 Other long term (current) drug therapy: Secondary | ICD-10-CM | POA: Diagnosis not present

## 2018-04-25 DIAGNOSIS — L0231 Cutaneous abscess of buttock: Secondary | ICD-10-CM

## 2018-04-25 HISTORY — DX: Cerebral infarction, unspecified: I63.9

## 2018-04-25 LAB — COMPREHENSIVE METABOLIC PANEL
ALT: 121 U/L — AB (ref 0–44)
AST: 49 U/L — ABNORMAL HIGH (ref 15–41)
Albumin: 3.6 g/dL (ref 3.5–5.0)
Alkaline Phosphatase: 171 U/L — ABNORMAL HIGH (ref 38–126)
Anion gap: 13 (ref 5–15)
BUN: 7 mg/dL (ref 6–20)
CALCIUM: 9.5 mg/dL (ref 8.9–10.3)
CHLORIDE: 101 mmol/L (ref 98–111)
CO2: 26 mmol/L (ref 22–32)
CREATININE: 0.87 mg/dL (ref 0.61–1.24)
Glucose, Bld: 85 mg/dL (ref 70–99)
Potassium: 3.8 mmol/L (ref 3.5–5.1)
Sodium: 140 mmol/L (ref 135–145)
Total Bilirubin: 0.7 mg/dL (ref 0.3–1.2)
Total Protein: 7.5 g/dL (ref 6.5–8.1)

## 2018-04-25 LAB — CBC WITH DIFFERENTIAL/PLATELET
Abs Immature Granulocytes: 0 10*3/uL (ref 0.0–0.1)
BASOS PCT: 1 %
Basophils Absolute: 0.1 10*3/uL (ref 0.0–0.1)
EOS ABS: 0.3 10*3/uL (ref 0.0–0.7)
EOS PCT: 2 %
HCT: 46.2 % (ref 39.0–52.0)
Hemoglobin: 15.9 g/dL (ref 13.0–17.0)
Immature Granulocytes: 0 %
Lymphocytes Relative: 25 %
Lymphs Abs: 3.7 10*3/uL (ref 0.7–4.0)
MCH: 32.8 pg (ref 26.0–34.0)
MCHC: 34.4 g/dL (ref 30.0–36.0)
MCV: 95.3 fL (ref 78.0–100.0)
MONO ABS: 1.2 10*3/uL — AB (ref 0.1–1.0)
Monocytes Relative: 8 %
Neutro Abs: 9.7 10*3/uL — ABNORMAL HIGH (ref 1.7–7.7)
Neutrophils Relative %: 64 %
PLATELETS: 304 10*3/uL (ref 150–400)
RBC: 4.85 MIL/uL (ref 4.22–5.81)
RDW: 12.6 % (ref 11.5–15.5)
WBC: 14.9 10*3/uL — ABNORMAL HIGH (ref 4.0–10.5)

## 2018-04-25 LAB — I-STAT CG4 LACTIC ACID, ED: LACTIC ACID, VENOUS: 1.69 mmol/L (ref 0.5–1.9)

## 2018-04-25 LAB — URINALYSIS, ROUTINE W REFLEX MICROSCOPIC
BILIRUBIN URINE: NEGATIVE
GLUCOSE, UA: NEGATIVE mg/dL
HGB URINE DIPSTICK: NEGATIVE
Ketones, ur: NEGATIVE mg/dL
Leukocytes, UA: NEGATIVE
Nitrite: NEGATIVE
Protein, ur: NEGATIVE mg/dL
Specific Gravity, Urine: 1.015 (ref 1.005–1.030)
pH: 5 (ref 5.0–8.0)

## 2018-04-25 MED ORDER — SULFAMETHOXAZOLE-TRIMETHOPRIM 800-160 MG PO TABS
1.0000 | ORAL_TABLET | Freq: Once | ORAL | Status: AC
  Administered 2018-04-25: 1 via ORAL
  Filled 2018-04-25: qty 1

## 2018-04-25 MED ORDER — CEPHALEXIN 250 MG PO CAPS
500.0000 mg | ORAL_CAPSULE | Freq: Once | ORAL | Status: AC
  Administered 2018-04-25: 500 mg via ORAL
  Filled 2018-04-25: qty 2

## 2018-04-25 MED ORDER — LIDOCAINE-EPINEPHRINE (PF) 2 %-1:200000 IJ SOLN
10.0000 mL | Freq: Once | INTRAMUSCULAR | Status: AC
  Administered 2018-04-25: 10 mL via INTRADERMAL
  Filled 2018-04-25: qty 20

## 2018-04-25 MED ORDER — SULFAMETHOXAZOLE-TRIMETHOPRIM 800-160 MG PO TABS: 1.0000 | ORAL_TABLET | Freq: Two times a day (BID) | ORAL | 0 refills | Status: DC

## 2018-04-25 MED ORDER — CEPHALEXIN 500 MG PO CAPS: 500.0000 mg | ORAL_CAPSULE | Freq: Four times a day (QID) | ORAL | 0 refills | Status: DC

## 2018-04-25 NOTE — ED Notes (Signed)
ED Provider at bedside. 

## 2018-04-25 NOTE — Discharge Instructions (Addendum)
Please take both antibiotics for 7 days until they are completely gone.  Sit in the bathtub twice a day and Epson salt to soak and clean out the wound.  Dry thoroughly and apply dressing like we showed you here in the ER today.  Return to the ER immediately if you notice that the redness spreads beyond the marker point, if you have fever, vomiting or any new symptoms.

## 2018-04-25 NOTE — ED Provider Notes (Signed)
MSE was initiated and I personally evaluated the patient and placed orders (if any) at  3:14 PM on April 25, 2018.  The patient appears stable so that the remainder of the MSE may be completed by another provider.  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  Patient presents with his father who assist in history, as patient has global aphasia.  They report that father saw the abscess drained this morning, and patient noted to be in relief.  No fevers noted at home.  Unknown duration of the abscess or erythema of the right buttock.  Patient denies feeling feverish at present, nor having any pain.  No chest pain or shortness of breath at present.  ROS: See HPI (one)  Physical Exam:   Gen: No distress  Neuro: Awake and Alert  Skin: Warm.  Chaperoned exam of right buttock, which demonstrates erythema and punctate, draining area of purulence of the medial buttock.    Focused Exam: Patient tachycardic on examination.   Initiation of care has begun. The patient has been counseled on the process, plan, and necessity for staying for the completion/evaluation, and the remainder of the medical screening examination    Tamala Julian 04/25/18 1517    Davonna Belling, MD 04/25/18 1553

## 2018-04-25 NOTE — ED Provider Notes (Signed)
[facility] EMERGENCY DEPARTMENT Provider Note   CSN: [id number] Arrival date & time: 04/25/18  1443     History   Chief Complaint Chief Complaint  Patient presents with  . Wound Infection    HPI Xavier Villegas is a 37 y.o. male.  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  Patient's father provides majority of the history given patient's aphasia.  He reports that 3 days ago he noticed a red area over patient's right buttocks which has progressively increased in size and began draining a green pus today.  Patient nods yes that it is painful.  Father states that he otherwise has been well, participating in therapy.  No known fevers.  Patient denies pain with bowel movement.  He denies abdominal pain.  No areas of erythema elsewhere.  Past Medical History:  Diagnosis Date  . Stroke ([place])   . Tobacco abuse     Patient Active Problem List   Diagnosis Date Noted  . Neurogenic bowel   . Neurogenic bladder   . Spastic hemiparesis ([place])   . 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 Cerebral infarction due to embolism of left middle cerebral artery (HCC) s/p thrombectomy 03/01/2018  . Carotid artery dissection ([place]) Left 03/01/2018  . Acute ischemic right MCA stroke ([place]) 03/01/2018  . Right hemiparesis ([place])   . Dysphagia, post-stroke   . Global aphasia   . Leukocytosis   . Folic acid deficiency 84/13/2440  . B12 deficiency 02/23/2018  . Hyperhomocysteinemia ([place]) 02/23/2018  . Smoker     Past Surgical History:  Procedure Laterality Date  . CRANIOTOMY Left 02/21/2018   Procedure: DECOMPRESSIVE CRANIECTOMY with bone flap to abdomen;  Surgeon: [name], MD;  Location: [place];  Service: Neurosurgery;  Laterality: Left;  DECOMPRESSIVE CRANIECTOMY with bone flap to abdomen  . IR ANGIO INTRA EXTRACRAN SEL COM CAROTID INNOMINATE UNI R MOD SED  02/19/2018  . IR ANGIO VERTEBRAL SEL VERTEBRAL BILAT MOD SED  02/19/2018  . IR CT HEAD LTD  02/19/2018  . IR PERCUTANEOUS ART THROMBECTOMY/INFUSION INTRACRANIAL INC DIAG ANGIO  02/19/2018  . IR US GUIDE VASC ACCESS RIGHT  02/19/2018  . RADIOLOGY WITH ANESTHESIA N/A 02/19/2018   Procedure: IR WITH ANESTHESIA CODE STROKE;  Surgeon: Corrie Mckusick, DO;  Location: [place];  Service: Anesthesiology;  Laterality: N/A;  . TEE WITHOUT CARDIOVERSION N/A 03/01/2018   Procedure: TRANSESOPHAGEAL ECHOCARDIOGRAM (TEE);  Surgeon: Sanda Klein, MD;  Location: [facility] ENDOSCOPY;  Service: Cardiovascular;  Laterality: N/A;        Home Medications    Prior to Admission medications   Medication Sig Start Date End Date Taking? Authorizing Provider  acetaminophen (TYLENOL) 325 MG tablet Take 1-2 tablets (325-650 mg total) by mouth every 4 (four) hours as needed for mild pain. 03/10/18   Love, Ivan Anchors, PA-C  acetaminophen (TYLENOL) 650 MG CR tablet Take 650 mg by mouth daily as needed for pain.    [provider]  aspirin 325 MG tablet Take 1 tablet (325 mg total) by mouth daily. 03/02/18   Donzetta Starch, NP  baclofen (LIORESAL) 10 MG tablet Take 1 tablet (10 mg total) by mouth 4 (four) times daily. 04/18/18   Meredith Staggers,  MD  cyanocobalamin 1000 MCG tablet Take 1 tablet (1,000 mcg total) by mouth daily. 04/18/18   Meredith Staggers, MD  folic acid (FOLVITE) 1 MG tablet Take 4 tablets (4 mg total) by mouth daily. Patient taking differently: Take 1 mg by mouth 4 (four) times daily.  04/18/18   Meredith Staggers, MD  Menthol-Methyl Salicylate (MUSCLE RUB) 10-15 % CREA Apply 1 application topically 2 (two) times daily. Patient taking differently: Apply 1 application topically 2 (two) times daily as needed for muscle pain.   03/10/18   Love, Ivan Anchors, PA-C  pantoprazole (PROTONIX) 40 MG tablet Take 40 mg by mouth daily.    [provider]  pyridOXINE (B-6) 50 MG tablet Take 1 tablet (50 mg total) by mouth daily. 04/18/18   Meredith Staggers, MD  senna-docusate (SENOKOT-S) 8.6-50 MG tablet Take 2 tablets by mouth at bedtime as needed for mild constipation. Patient not taking: Reported on 04/21/2018 04/06/18   Bary Leriche, PA-C    Family History Family History  Problem Relation Age of Onset  . Stroke Maternal Grandmother   . Liver cancer Maternal Grandmother   . Stroke Maternal Grandfather   . Leukemia Mother        CLL/Dr. Leretha Pol at [facility]  . Diabetes Father   . High blood pressure Father   . Liver cancer Paternal Grandmother   . Cerebral aneurysm Paternal Grandfather     Social History Social History   Tobacco Use  . Smoking status: Former Smoker    Packs/day: 0.50    Types: Cigarettes    Last attempt to quit: 02/19/2018    Years since quitting: 0.1  . Smokeless tobacco: Never Used  Substance Use Topics  . Alcohol use: Not Currently    Alcohol/week: 14.0 standard drinks    Types: 14 Cans of beer per week  . Drug use: Never     Allergies   Patient has no known allergies.   Review of Systems Review of Systems  Constitutional: Negative for chills and fever.  Eyes: Negative for visual disturbance.  Respiratory: Negative for shortness of breath.   Cardiovascular: Negative for chest pain.  Gastrointestinal: Negative for abdominal pain, nausea, rectal pain and vomiting.  Genitourinary: Negative for scrotal swelling and testicular pain.  Musculoskeletal: Negative for back pain.  Skin: Positive for color change (erythema over right buttocks) and wound.  Neurological: Positive for speech difficulty (aphasia).  Psychiatric/Behavioral: Negative for agitation.     Physical Exam Updated Vital Signs BP (!) 129/91   Pulse 93   Temp 98 F (36.7 C) (Oral)   Resp 13   SpO2 100%    Physical Exam  Constitutional: He appears well-developed and well-nourished. No distress.  No acute distress, non-toxic appearing.  HENT:  Mouth/Throat: Oropharynx is clear and moist.  Eyes: Right eye exhibits no discharge. Left eye exhibits no discharge.  Neck: Normal range of motion.  Cardiovascular: Normal rate, regular rhythm and intact distal pulses.  Pulmonary/Chest: Effort normal and breath sounds normal. No stridor. No respiratory distress. He has no wheezes. He has no rales.  Abdominal: Soft. Bowel sounds are normal. There is no tenderness.  Musculoskeletal:  Right buttocks with 2cm area of erythema and induration. Central open wound which is draining yellow purulent material. Induration does not track to the rectum and no pain with rectal exam.   Neurological: He is alert. Coordination normal.  Expressive aphasia. Nods yes and no, does not speak.   Skin: Skin is warm and  dry. He is not diaphoretic.  Psychiatric: He has a normal mood and affect. His behavior is normal.  Nursing note and vitals reviewed.      ED Treatments / Results  Labs (all labs ordered are listed, but only abnormal results are displayed) Labs Reviewed  COMPREHENSIVE METABOLIC PANEL - Abnormal; Notable for the following components:      Result Value   AST 49 (*)    ALT 121 (*)    Alkaline Phosphatase 171 (*)    All other components within normal limits  CBC WITH DIFFERENTIAL/PLATELET - Abnormal; Notable for the following components:   WBC 14.9 (*)    Neutro Abs 9.7 (*)    Monocytes Absolute 1.2 (*)    All other components within normal limits  CULTURE, BLOOD (ROUTINE X 2)  CULTURE, BLOOD (ROUTINE X 2)  AEROBIC CULTURE (SUPERFICIAL SPECIMEN)  URINALYSIS, ROUTINE W REFLEX MICROSCOPIC  I-STAT CG4 LACTIC ACID, ED  I-STAT CG4 LACTIC ACID, ED    EKG None  Radiology Dg Chest 2 View  Result Date: 04/25/2018 CLINICAL DATA:  Tachycardia, right buttock infection EXAM: CHEST - 2 VIEW COMPARISON:   02/23/2018 chest radiograph. FINDINGS: Stable cardiomediastinal silhouette with normal heart size. No pneumothorax. No pleural effusion. Lungs appear clear, with no acute consolidative airspace disease and no pulmonary edema. IMPRESSION: No active cardiopulmonary disease. Electronically Signed   By: Ilona Sorrel M.D.   On: 04/25/2018 15:30    Procedures Procedures (including critical care time)  EMERGENCY DEPARTMENT US SOFT TISSUE INTERPRETATION "Study: Limited Soft Tissue Ultrasound"  INDICATIONS: Pain and Soft tissue infection Multiple views of the body part were obtained in real-time with a multi-frequency linear probe  PERFORMED BY: Myself IMAGES ARCHIVED?: Yes SIDE:Right  BODY PART:buttock INTERPRETATION:  Cellulitis present     Medications Ordered in ED Medications  lidocaine-EPINEPHrine (XYLOCAINE W/EPI) 2 %-1:200000 (PF) injection 10 mL (10 mLs Intradermal Given by Other 04/25/18 1951)  sulfamethoxazole-trimethoprim (BACTRIM DS,SEPTRA DS) 800-160 MG per tablet 1 tablet (1 tablet Oral Given 04/25/18 2147)  cephALEXin (KEFLEX) capsule 500 mg (500 mg Oral Given 04/25/18 2148)     Initial Impression / Assessment and Plan / ED Course  I have reviewed the triage vital signs and the nursing notes.  Pertinent labs & imaging results that were available during my care of the patient were reviewed by me and considered in my medical decision making (see chart for details).     Patient with localized cellulitis and draining abscess over right buttocks. It does not track to the anus. No fever, chills, vomiting, abdominal pain. He is hemodynamically stable. Work up reveals leukocytosis with WBC 14.9. No lactic acidosis. No concern for sepsis given exam and labwork. CMP unremarkable, his AST and ALT are mildly elevated although this appears chronic for him. Bedside ultrasound without significant purulence beyond the open wound. Wound cleaned with pressure wash and dressed in the ED. He will  be discharged with Bactrim and keflex and counseled on home warm soaks. We discussed return precautions. Sent wound culture. He agrees with plan and appears reliable.   Final Clinical Impressions(s) / ED Diagnoses   Final diagnoses:  Wound infection    ED Discharge Orders         Ordered    sulfamethoxazole-trimethoprim (BACTRIM DS,SEPTRA DS) 800-160 MG tablet  2 times daily     04/25/18 2140    cephALEXin (KEFLEX) 500 MG capsule  4 times daily     04/25/18 2140  Glyn Ade, PA-C 04/25/18 2221    Maudie Flakes, MD 04/26/18 [phone]

## 2018-04-25 NOTE — ED Triage Notes (Signed)
Pt in with wound to his right buttocks that is open and draining, pt recently admitted for CVA and is now recovering at home, denies fever at home

## 2018-04-26 ENCOUNTER — Encounter (HOSPITAL_COMMUNITY): Payer: Self-pay | Admitting: *Deleted

## 2018-04-26 ENCOUNTER — Ambulatory Visit: Payer: BLUE CROSS/BLUE SHIELD

## 2018-04-26 ENCOUNTER — Encounter: Payer: Self-pay | Admitting: Occupational Therapy

## 2018-04-26 ENCOUNTER — Ambulatory Visit: Payer: BLUE CROSS/BLUE SHIELD | Admitting: Speech Pathology

## 2018-04-26 ENCOUNTER — Other Ambulatory Visit: Payer: Self-pay

## 2018-04-26 ENCOUNTER — Ambulatory Visit: Payer: BLUE CROSS/BLUE SHIELD | Admitting: Occupational Therapy

## 2018-04-26 ENCOUNTER — Encounter: Payer: Self-pay | Admitting: Physical Therapy

## 2018-04-26 DIAGNOSIS — I69351 Hemiplegia and hemiparesis following cerebral infarction affecting right dominant side: Secondary | ICD-10-CM

## 2018-04-26 DIAGNOSIS — R262 Difficulty in walking, not elsewhere classified: Secondary | ICD-10-CM | POA: Diagnosis not present

## 2018-04-26 DIAGNOSIS — R482 Apraxia: Secondary | ICD-10-CM | POA: Diagnosis not present

## 2018-04-26 DIAGNOSIS — I63511 Cerebral infarction due to unspecified occlusion or stenosis of right middle cerebral artery: Secondary | ICD-10-CM | POA: Diagnosis not present

## 2018-04-26 DIAGNOSIS — M6281 Muscle weakness (generalized): Secondary | ICD-10-CM | POA: Diagnosis not present

## 2018-04-26 DIAGNOSIS — R2689 Other abnormalities of gait and mobility: Secondary | ICD-10-CM

## 2018-04-26 DIAGNOSIS — R4701 Aphasia: Secondary | ICD-10-CM | POA: Diagnosis not present

## 2018-04-26 DIAGNOSIS — M25511 Pain in right shoulder: Secondary | ICD-10-CM | POA: Diagnosis not present

## 2018-04-26 DIAGNOSIS — R278 Other lack of coordination: Secondary | ICD-10-CM | POA: Diagnosis not present

## 2018-04-26 NOTE — Progress Notes (Addendum)
Anesthesia Chart Review: Xavier Villegas  Case:  [id number] Date/Time:  04/27/18 1211   Procedure:  RE-IMPLANTATION OF CRANIAL FLAP (N/A ) - RE-IMPLANTATION OF CRANIAL FLAP   Anesthesia type:  General   Pre-op diagnosis:  CEREBROVASCULAR ACCIDENT   Location:  [facility] OR ROOM 19 / [facility] OR   Surgeon:  [name], MD      DISCUSSION: Patient is a 37 year old male scheduled for the above procedure. History includes former smoker (quit 02/19/18), GERD, CVA/lieft ICA dissection (see below). - On 02/19/18, he awoke with right sided weakness and aphasia. He was diagnosed with acute left frontal and occipital parietal lobs infarcts. He was outside the window for tPA. Emergent IR cerebral angiogram showed occludes left MCA with acute left ICA dissection believed to be the source of intracranial emboli, s/p mechanical thrombectomy of left MCA ELVO achieving TICI 2b reperfusion after 3 passes. Post-procedure MRI/MRA showed acute infarct of the entire left MCA territory with mild associated hemorrhage in the left parietal and frontal lobe (reperfusion hemorrhagic transformation), diffuse edema, 2 mm midline shift to the right, no other infarct, occlusion of the left internal carotid artery and entire left middle cerebral artery. Transthoracic echo was concerning for aortic valve vegetation/endocarditis, so cardiologist Dr. Jenkins Rouge was consulted. SBE could be related to soft tissue infection of his lower lip a few weeks prior. ID (Dr. Linus Salmons) recommended 6 weeks antibiotics. Follow-up TEE recommended, but in the meantime he was cleared to undergo decompressive left craniectomy with bone flap to abdomen on 02/21/18. (Patient ultimately had a normal TEE on 03/01/18. Blood cultures also negative except one of four which was felt to be contamination. Antibiotics discontinued per ID and cardiology.) By notes, hypercoagulable work-up was negative except elevated homocysteine at 181.7. Discharged to CIR 03/01/18 for PT/ST/OT (discharged  from [facility] 04/06/18 for on-going out-patient therapies; supervision to minimum assist level of care; neurogenic bowel and bladder with ongoing training; baclofen for right spastic hemiparesis; ongoing issues with expressive aphasia).  - ED visit 04/25/18 for right buttock abscess, with partial drainage at home per patient's father. Bedside U/S showed no significant purulence beyond the open would. WBC 14.9. Wound irrigated and dressed in ED. He was discharged on Bactrim and Keflex. Blood culture no growth to date. Wound culture still pending.  I have notified Lorriane Shire at Dr. Windy Carina office of ED visit for right buttock abscess. She will have Dr. Saintclair Halsted review records to determine if events will influence timing of planned surgery. (UPDATE: Case has now been moved to 05/02/18.)   VS: Vitals 04/25/18: BP 124-129/91-95, HR 93-103.   PROVIDERS: Jodelle Green, FNP is PCP [facility]). Established care 04/19/18.    LABS: Last labs include additional day of surgery labs per surgeon orders:   Lab Results  Component Value Date   WBC 14.9 (H) 04/25/2018   HGB 15.9 04/25/2018   HCT 46.2 04/25/2018   PLT 304 04/25/2018   GLUCOSE 85 04/25/2018   CHOL 101 02/20/2018   TRIG 94 02/22/2018   HDL 34 (L) 02/20/2018   LDLCALC 46 02/20/2018   ALT 121 (H) 04/25/2018   AST 49 (H) 04/25/2018   NA 140 04/25/2018   K 3.8 04/25/2018   CL 101 04/25/2018   CREATININE 0.87 04/25/2018   BUN 7 04/25/2018   CO2 26 04/25/2018   TSH 1.000 02/20/2018   INR 1.02 02/19/2018   HGBA1C 4.9 02/20/2018    IMAGES: CXR 04/25/18: IMPRESSION: No active cardiopulmonary disease.  EKG: 02/23/18: SB at 53 bpm with sinus arrhythmia with short PR.  Nonspecific ST and T wave abnormality. PR 98 ms.   CV: TEE 03/01/18: Study Conclusions - Left ventricle: The cavity size was normal. Wall thickness was   normal. Systolic function was normal. The estimated ejection   fraction was in the range of 55% to 60%. Wall motion was  normal;   there were no regional wall motion abnormalities. - Left atrium: No evidence of thrombus in the atrial cavity or   appendage. - Right atrium: No evidence of thrombus in the atrial cavity or   appendage. - Atrial septum: No defect or patent foramen ovale was identified.   Echo contrast study showed no right-to-left atrial level shunt,   at baseline or with provocation. Impressions: - Normal TEE. Specifically, there are no masses or vegetations on   the aortic valve.  Echo (TTE) 02/20/18: Study Conclusions - Left ventricle: The cavity size was normal. Wall thickness was   increased in a pattern of mild LVH. Systolic function was normal.   The estimated ejection fraction was in the range of 55% to 60%.   Wall motion was normal; there were no regional wall motion   abnormalities. Left ventricular diastolic function parameters   were normal. - Aortic valve: There was a small, mobile vegetation on the left   ventricular aspect. Impressions: - There is a small - medium sized mobile mass on the LV aspect of   the aortic valve.   The appearence is c/w vegetation.   Aortic valve endocarditis  Carotid U/S 02/20/18: Final Interpretation: Left Carotid: Velocities in the left ICA are consistent with a total occlusion. Vertebrals: Right vertebral artery demonstrates antegrade flow. Unable to       visualize left vertebral secondary to positioning. Subclavians: Normal flow hemodynamics were seen in bilateral subclavian       arteries.  BLE venous Duplex 02/20/18: Final Interpretation: Right: There is no evidence of deep vein thrombosis in the lower extremity. Left: There is no evidence of deep vein thrombosis in the lower extremity.   Past Medical History:  Diagnosis Date  . GERD (gastroesophageal reflux disease)   . Stroke ([place])   . Tobacco abuse     Past Surgical History:  Procedure Laterality Date  . CRANIOTOMY Left 02/21/2018   Procedure: DECOMPRESSIVE  CRANIECTOMY with bone flap to abdomen;  Surgeon: [name], MD;  Location: [place];  Service: Neurosurgery;  Laterality: Left;  DECOMPRESSIVE CRANIECTOMY with bone flap to abdomen  . IR ANGIO INTRA EXTRACRAN SEL COM CAROTID INNOMINATE UNI R MOD SED  02/19/2018  . IR ANGIO VERTEBRAL SEL VERTEBRAL BILAT MOD SED  02/19/2018  . IR CT HEAD LTD  02/19/2018  . IR PERCUTANEOUS ART THROMBECTOMY/INFUSION INTRACRANIAL INC DIAG ANGIO  02/19/2018  . IR US GUIDE VASC ACCESS RIGHT  02/19/2018  . RADIOLOGY WITH ANESTHESIA N/A 02/19/2018   Procedure: IR WITH ANESTHESIA CODE STROKE;  Surgeon: Corrie Mckusick, DO;  Location: [place];  Service: Anesthesiology;  Laterality: N/A;  . TEE WITHOUT CARDIOVERSION N/A 03/01/2018   Procedure: TRANSESOPHAGEAL ECHOCARDIOGRAM (TEE);  Surgeon: Sanda Klein, MD;  Location: [facility] ENDOSCOPY;  Service: Cardiovascular;  Laterality: N/A;    MEDICATIONS: No current facility-administered medications for this encounter.    Marland Kitchen acetaminophen (TYLENOL) 650 MG CR tablet  . aspirin 325 MG tablet  . baclofen (LIORESAL) 10 MG tablet  . cyanocobalamin 1000 MCG tablet  . folic acid (FOLVITE) 1 MG tablet  . Menthol-Methyl Salicylate (MUSCLE  RUB) 10-15 % CREA  . pantoprazole (PROTONIX) 40 MG tablet  . pyridOXINE (B-6) 50 MG tablet  . acetaminophen (TYLENOL) 325 MG tablet  . cephALEXin (KEFLEX) 500 MG capsule  . senna-docusate (SENOKOT-S) 8.6-50 MG tablet  . sulfamethoxazole-trimethoprim (BACTRIM DS,SEPTRA DS) 800-160 MG tablet  Last ASA dose 04/19/18.   George Hugh [facility] Short Stay Center/Anesthesiology Phone [phone] 04/26/2018 3:09 PM

## 2018-04-26 NOTE — Progress Notes (Signed)
I left a message for Lorriane Shire regarding cellulitis of right buttocks.
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
## 2018-04-26 NOTE — Therapy (Addendum)
X[place] Villegas [facility] SERVICES [address] [place], [place], [address] Phone: [phone]   Fax:  [phone]  Physical Therapy Treatment  Patient Details  Name: Xavier Villegas MRN: [id number] Date of Birth: 10-Apr-1981 Referring Provider: Reesa Chew   Encounter Date: 04/26/2018  PT End of Session - 04/26/18 1559    Visit Number  3    Number of Visits  25    Date for PT Re-Evaluation  07/14/18    PT Start Time  1600    PT Stop Time  1645    PT Time Calculation (min)  45 min    Equipment Utilized During Treatment  Gait belt    Activity Tolerance  Patient tolerated treatment well    Behavior During Therapy  [facility] for tasks assessed/performed       Past Medical History:  Diagnosis Date  . Aphasia   . GERD (gastroesophageal reflux disease)   . Hemiparesis ([place]) 02/2018   right side  . Hyperhomocysteinemia ([place])   . Neurogenic bladder 02/2018   04/26/18 inporving  . Spastic hemiparesis (HCC)    right side  . Stroke ([place])    Left MCA infart  . Tobacco abuse     Past Surgical History:  Procedure Laterality Date  . CRANIOTOMY Left 02/21/2018   Procedure: DECOMPRESSIVE CRANIECTOMY with bone flap to abdomen;  Surgeon: [name], MD;  Location: [place];  Service: Neurosurgery;  Laterality: Left;  DECOMPRESSIVE CRANIECTOMY with bone flap to abdomen  . IR ANGIO INTRA EXTRACRAN SEL COM CAROTID INNOMINATE UNI R MOD SED  02/19/2018  . IR ANGIO VERTEBRAL SEL VERTEBRAL BILAT MOD SED  02/19/2018  . IR CT HEAD LTD  02/19/2018  . IR PERCUTANEOUS ART THROMBECTOMY/INFUSION INTRACRANIAL INC DIAG ANGIO  02/19/2018  . IR US GUIDE VASC ACCESS RIGHT  02/19/2018  . RADIOLOGY WITH ANESTHESIA N/A 02/19/2018   Procedure: IR WITH ANESTHESIA CODE STROKE;  Surgeon: Corrie Mckusick, DO;  Location: [place];  Service: Anesthesiology;  Laterality: N/A;  . TEE WITHOUT CARDIOVERSION N/A 03/01/2018   Procedure: TRANSESOPHAGEAL ECHOCARDIOGRAM (TEE);  Surgeon: Sanda Klein, MD;  Location: [facility] ENDOSCOPY;  Service: Cardiovascular;  Laterality: N/A;    There were no vitals filed for this visit.  Subjective Assessment - 04/26/18 1649    Subjective  Patient conveys some discomfort related to wound he went to ED for yesterday. Pt very interactive and engaged throughout session. Surgery scheduled for tomorrow to replace bone flap has been delayed due to wound in R buttock and cellulitis.     Patient is accompained by:  Family member    Pertinent History  [name]  had left MCA infarct and subsequent craniectomy.  He was discharged from rehab several weeks ago. He has been home with his family  He is having occasional spasms on the right side still.Patient has Right hemiparesis, dysphagia, and aphasia secondary to large left MCA infarct with hemorrhagic transformation s/p hemicraniectomy.He had acute ischemic left MCA infarct and subsequent craniotomy - was first admitted on 03/01/18.  He was discharged from hospital on 04/06/18    How long can you stand comfortably?  stand for 20 mins    How long can you walk comfortably?  10 mins    Patient Stated Goals  to walk without AD and live on his own.    Pain Score  0-No pain   Based on faces scale       Treatment Side stepping onto airex pad with RLE  with weight shift onto RLE x10 reps with LUE support, CGA for safety, VCs for sequencing and increasing weight shift    Forward lunges onto airex pad with RLE x10 reps with LUE support, CGA for safety, VCs for increasing weight shift forwards and proper technique  Standing weight shift with each LE in front x10 reps with LUE support, CGA for safety with VCs for sequencing and increasing weight shift; VCs for keeping R toes pointing forwards with LLE in front and fully extending R knee when shifting weight back  Standing R terminal knee extension x10 reps with VCs to squeeze quads and glut muscles for stabilizing and maintaining upright posture  Squats without UE  support with min A for stability and blocking R knee to prevent buckling x10 reps with VCs for upright posture and bending at the knees; added airex pad under LLE x10 reps   Step over hurdle attempted but unable to clear hurdle with RLE, step over half bolster with RLE with LUE support x10 reps, attempted without UE support but unable to clear bolster, VCs for increasing R hip and knee flexion to clear the bolster   Static alternating marching with LUE support x8 reps each leg with VCs to bend the R knee for better marching; attempted without UE support and able to perform one repetition but unable to complete any other repetitions  Balloon passes x5 min, CGA for safety without UE support, reaching outside and inside BOS as well as crossing midline with LUE; demonstrated quick reflexes and response to balloon   [place]o ball rungs standing on airex pad, reaching with RUE to move 5 balls up each level and then back down, moved 3 balls to highest level and then back down, moved saebo out of comfortable reach of patient to work on forward reach outside [employer]           PT Education - 04/26/18 1558    Education Details  weight shift, balance, gait safety    Person(s) Educated  Patient    Methods  Explanation;Demonstration;Verbal cues    Comprehension  Verbalized understanding;Returned demonstration;Verbal cues required;Need further instruction       PT Short Term Goals - 04/21/18 1443      PT SHORT TERM GOAL #1   Title  Patient will be independent in home exercise program to improve strength/mobility for better functional independence with ADLs.    Time  6    Period  Weeks    Status  New    Target Date  06/02/18      PT SHORT TERM GOAL #2   Title  Patient (< 70 years old) will complete five times sit to stand test in < 10 seconds indicating an increased LE strength and improved balance.    Baseline  15.67 sec    Time  6    Period  Weeks    Status  New    Target Date  06/02/18       PT SHORT TERM GOAL #3   Title  Patient will reduce timed up and go to <11 seconds to reduce fall risk and demonstrate improved transfer/gait ability.    Baseline  18.88 sec    Time  6    Period  Weeks    Status  New    Target Date  06/02/18        PT Long Term Goals - 04/21/18 1446      PT LONG TERM GOAL #1   Title  Patient  will increase six minute walk test distance to >1000 for progression to community ambulator and improve gait ability    Baseline  585 feet    Time  12    Period  Weeks    Status  New    Target Date  07/14/18      PT LONG TERM GOAL #2   Title  Patient will increase 10 meter walk test to >1.36m/s as to improve gait speed for better community ambulation and to reduce fall risk.    Baseline  .48 m/sec    Time  12    Period  Weeks    Status  New    Target Date  07/14/18      PT LONG TERM GOAL #3   Title  Patient will increase Berg Balance score by > 6 points to demonstrate decreased fall risk during functional activities.    Baseline  25/56    Time  12    Period  Weeks    Status  New    Target Date  07/14/18      PT LONG TERM GOAL #4   Title  Patient will be require no assist with ascend/descend 3 steps using Least restrictive assistive device.    Baseline  needs rail and cues for sequencing and min assist for RLE ascending the step    Time  12    Period  Weeks    Status  New    Target Date  07/14/18            Plan - 04/26/18 1707    Clinical Impression Statement  Patient demonstrated decreased R knee stability when performing weight shifts onto RLE; required CGA-min A for stabilizing R knee in weight shifting exercises. Pt required min A for squat exercises with block to R knee to prevent R knee buckling with squats. Pt demonstrated improved R knee extension when performing terminal knee extension in standing with R quad and gluteal activation. Pt had difficulty stepping over bolster due to decreased R hip and knee flexion and increased lateral  trunk lean to the L when utilizing LUE support; required VCs to attempt more upright posture. Pt demonstrated strong ability to reach outside BOS and cross midline with balloon passes; required CGA for safety. Pt clearly understands complex commands and tasks but difficulty responding. Pt able to verbalize "no" and laughs appropriately during session. Pt will benefit from skilled PT to improve strength, mobility, balance, gait, and improve quality of life.      Rehab Potential  Good    PT Frequency  2x / week    PT Duration  12 weeks    PT Treatment/Interventions  Manual techniques;Patient/family education;Neuromuscular re-education;Balance training;Functional mobility training;Therapeutic activities;Therapeutic exercise;Gait training;Orthotic Fit/Training;Passive range of motion    PT Next Visit Plan  strengthening, balance, gait training    PT Home Exercise Plan  standing abd bilaterally x 15    Consulted and Agree with Plan of Care  Patient;Family member/caregiver       Patient will benefit from skilled therapeutic intervention in order to improve the following deficits and impairments:  Abnormal gait, Decreased balance, Decreased endurance, Decreased mobility, Difficulty walking, Impaired sensation, Increased muscle spasms, Impaired UE functional use, Impaired flexibility, Decreased strength, Decreased coordination, Decreased activity tolerance  Visit Diagnosis: Muscle weakness (generalized)  Difficulty in walking, not elsewhere classified  Other abnormalities of gait and mobility  Hemiplegia and hemiparesis following cerebral infarction affecting right dominant side ([place])     Problem List  Patient Active Problem List   Diagnosis Date Noted  . Neurogenic bowel   . Neurogenic bladder   . Spastic hemiparesis ([place])   . 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 Cerebral infarction due to embolism of left middle cerebral artery  (HCC) s/p thrombectomy 03/01/2018  . Carotid artery dissection ([place]) Left 03/01/2018  . Acute ischemic right MCA stroke ([place]) 03/01/2018  . Right hemiparesis ([place])   . Dysphagia, post-stroke   . Global aphasia   . Leukocytosis   . Folic acid deficiency 38/88/7579  . B12 deficiency 02/23/2018  . Hyperhomocysteinemia ([place]) 02/23/2018  . Smoker    Harriet Masson, SPT [place] Grout PT, DPT, GCS  Huprich,Jason 04/27/2018, 5:20 PM  [place] [facility] SERVICES [address] [place], [place], [address] Phone: [phone]   Fax:  [phone]  Name: Arad Burston MRN: [id number] Date of Birth: July 19, 1981

## 2018-04-27 ENCOUNTER — Encounter: Payer: Self-pay | Admitting: Speech Pathology

## 2018-04-27 NOTE — Therapy (Signed)
[name] [facility] SERVICES [address] [place], [place], [address] Phone: [phone]   Fax:  [phone]  Speech Language Pathology Treatment  Patient Details  Name: Xavier Villegas MRN: [id number] Date of Birth: 1981-06-21 Referring Provider: Bary Leriche    Encounter Date: 04/26/2018  End of Session - 04/27/18 1116    Visit Number  3    Number of Visits  25    Date for SLP Re-Evaluation  07/13/18    SLP Start Time  1500    SLP Stop Time   1550    SLP Time Calculation (min)  50 min    Activity Tolerance  Patient tolerated treatment well       Past Medical History:  Diagnosis Date  . Aphasia   . GERD (gastroesophageal reflux disease)   . Hemiparesis ([place]) 02/2018   right side  . Hyperhomocysteinemia ([place])   . Neurogenic bladder 02/2018   04/26/18 inporving  . Spastic hemiparesis (HCC)    right side  . Stroke ([place])    Left MCA infart  . Tobacco abuse     Past Surgical History:  Procedure Laterality Date  . CRANIOTOMY Left 02/21/2018   Procedure: DECOMPRESSIVE CRANIECTOMY with bone flap to abdomen;  Surgeon: [name], MD;  Location: [place];  Service: Neurosurgery;  Laterality: Left;  DECOMPRESSIVE CRANIECTOMY with bone flap to abdomen  . IR ANGIO INTRA EXTRACRAN SEL COM CAROTID INNOMINATE UNI R MOD SED  02/19/2018  . IR ANGIO VERTEBRAL SEL VERTEBRAL BILAT MOD SED  02/19/2018  . IR CT HEAD LTD  02/19/2018  . IR PERCUTANEOUS ART THROMBECTOMY/INFUSION INTRACRANIAL INC DIAG ANGIO  02/19/2018  . IR US GUIDE VASC ACCESS RIGHT  02/19/2018  . RADIOLOGY WITH ANESTHESIA N/A 02/19/2018   Procedure: IR WITH ANESTHESIA CODE STROKE;  Surgeon: Corrie Mckusick, DO;  Location: [place];  Service: Anesthesiology;  Laterality: N/A;  . TEE WITHOUT CARDIOVERSION N/A 03/01/2018   Procedure: TRANSESOPHAGEAL ECHOCARDIOGRAM (TEE);  Surgeon: Sanda Klein, MD;  Location: [facility] ENDOSCOPY;  Service: Cardiovascular;  Laterality: N/A;    There were no  vitals filed for this visit.  Subjective Assessment - 04/27/18 1116    Subjective  "right"            ADULT SLP TREATMENT - 04/27/18 0001      General Information   Behavior/Cognition  Alert;Cooperative;Pleasant mood    HPI   37 year old man with left MCA CVA 02/19/2018.  Patient received inpatient rehab services, including SLP, for 5 weeks.       Treatment Provided   Treatment provided  Cognitive-Linquistic      Pain Assessment   Pain Assessment  No/denies pain      Cognitive-Linquistic Treatment   Treatment focused on  Aphasia;Apraxia    Skilled Treatment  SPEECH: Patient able to consistently hum in unison and to command.  Hum into "me" with 70% accuracy and hum into "moo" sporadically.  Patient was not able to hum into other vowels this session.  Imitate 6 words, is not able to repeat X3 while maintaining phonemic accuracy.  COMPREHENSION:  Answer 3-unit yes no questions with 95% accuracy.  Follow 2-unit body part commands with 65% accuracy.  Identify stated picture (f=12) with 80% accuracy.  Identify picture given gestured use with 100% accuracy.        Assessment / Recommendations / Plan   Plan  Continue with current plan of care      Progression Toward Goals  Progression toward goals  Progressing toward goals       SLP Education - 04/27/18 1116    Education Details  multi-modal communication    Person(s) Educated  Patient    Methods  Explanation    Comprehension  Verbalized understanding         SLP Long Term Goals - 04/14/18 1220      SLP LONG TERM GOAL #1   Title  Patient will name objects using multi-modal strategies with 80% accuracy.    Time  12    Period  Weeks    Status  New    Target Date  07/13/18      SLP LONG TERM GOAL #2   Title  Patient will read aloud or repeat words, maintaining phonemic accuracy, with 80% accuracy.      Time  12    Period  Weeks    Status  New    Target Date  07/13/18      SLP LONG TERM GOAL #3   Title  Patient will  complete 3 unit processing tasks with 80% accuracy without the need of repetition of task instructions or significant delays in responding.    Time  12    Period  Weeks    Status  New      SLP LONG TERM GOAL #4   Title  Patient will demonstrate reading comprehension for words and phrases with 80% accuracy.     Time  12    Period  Weeks    Status  New    Target Date  07/13/18       Plan - 04/27/18 1117    Clinical Impression Statement  Patient is alert and eager to work on his communication.  He demonstrates good comprehension for moderately complex yes/no questions but has more difficulty with more complex verbal information.  Patient demonstrates difficulty with body part identification.  Patient was stimulable for sustained hum (/m/) and initial /m/ syllables.    Speech Therapy Frequency  2x / week    Duration  Other (comment)    Treatment/Interventions  Language facilitation;Multimodal communcation approach;Patient/family education;SLP instruction and feedback    Potential to Achieve Goals  Good    Potential Considerations  Ability to learn/carryover information;Pain level;Family/community support;Co-morbidities;Previous level of function;Cooperation/participation level;Severity of impairments;Medical prognosis    SLP Home Exercise Plan  matching worksheets    Consulted and Agree with Plan of Care  Patient       Patient will benefit from skilled therapeutic intervention in order to improve the following deficits and impairments:   Aphasia  Apraxia    Problem List Patient Active Problem List   Diagnosis Date Noted  . Neurogenic bowel   . Neurogenic bladder   . Spastic hemiparesis ([place])   . 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 Cerebral infarction due to embolism of left middle cerebral artery (HCC) s/p thrombectomy 03/01/2018  . Carotid artery dissection ([place]) Left 03/01/2018  . Acute ischemic right MCA stroke  ([place]) 03/01/2018  . Right hemiparesis ([place])   . Dysphagia, post-stroke   . Global aphasia   . Leukocytosis   . Folic acid deficiency 69/48/5462  . B12 deficiency 02/23/2018  . Hyperhomocysteinemia ([place]) 02/23/2018  . Smoker    Leroy Sea, MS/CCC- SLP  Lou Miner 04/27/2018, 11:18 AM  [place] [address] [place], [place], [address] Phone: [phone]   Fax:  [phone]   Name: Daejon Lich MRN: [id number] Date of Birth: 05-31-1981

## 2018-04-28 ENCOUNTER — Ambulatory Visit: Payer: BLUE CROSS/BLUE SHIELD | Admitting: Physical Therapy

## 2018-04-28 ENCOUNTER — Encounter: Payer: Self-pay | Admitting: Speech Pathology

## 2018-04-28 ENCOUNTER — Encounter: Payer: Self-pay | Admitting: Physical Therapy

## 2018-04-28 ENCOUNTER — Encounter: Payer: Self-pay | Admitting: Occupational Therapy

## 2018-04-28 ENCOUNTER — Ambulatory Visit: Payer: BLUE CROSS/BLUE SHIELD | Admitting: Occupational Therapy

## 2018-04-28 ENCOUNTER — Ambulatory Visit: Payer: BLUE CROSS/BLUE SHIELD | Admitting: Speech Pathology

## 2018-04-28 DIAGNOSIS — R482 Apraxia: Secondary | ICD-10-CM

## 2018-04-28 DIAGNOSIS — R2689 Other abnormalities of gait and mobility: Secondary | ICD-10-CM | POA: Diagnosis not present

## 2018-04-28 DIAGNOSIS — M25511 Pain in right shoulder: Secondary | ICD-10-CM | POA: Diagnosis not present

## 2018-04-28 DIAGNOSIS — R262 Difficulty in walking, not elsewhere classified: Secondary | ICD-10-CM | POA: Diagnosis not present

## 2018-04-28 DIAGNOSIS — I69351 Hemiplegia and hemiparesis following cerebral infarction affecting right dominant side: Secondary | ICD-10-CM | POA: Diagnosis not present

## 2018-04-28 DIAGNOSIS — R4701 Aphasia: Secondary | ICD-10-CM

## 2018-04-28 DIAGNOSIS — M6281 Muscle weakness (generalized): Secondary | ICD-10-CM

## 2018-04-28 DIAGNOSIS — I63511 Cerebral infarction due to unspecified occlusion or stenosis of right middle cerebral artery: Secondary | ICD-10-CM | POA: Diagnosis not present

## 2018-04-28 DIAGNOSIS — R278 Other lack of coordination: Secondary | ICD-10-CM | POA: Diagnosis not present

## 2018-04-28 LAB — AEROBIC CULTURE W GRAM STAIN (SUPERFICIAL SPECIMEN)

## 2018-04-28 LAB — AEROBIC CULTURE  (SUPERFICIAL SPECIMEN)

## 2018-04-28 NOTE — Therapy (Signed)
[place] MAIN [facility] SERVICES [place], [place], [address] Phone: [phone]   Fax:  [phone]  Speech Language Pathology Treatment  Patient Details  Name: Xavier Villegas MRN: [id number] Date of Birth: January 14, 1981 Referring Provider: Bary Leriche    Encounter Date: 04/28/2018  End of Session - 04/28/18 1224    Visit Number  4    Number of Visits  25    Date for SLP Re-Evaluation  07/13/18    SLP Start Time  1000    SLP Stop Time   1054    SLP Time Calculation (min)  54 min    Activity Tolerance  Patient tolerated treatment well       Past Medical History:  Diagnosis Date  . Aphasia   . GERD (gastroesophageal reflux disease)   . Hemiparesis ([place]) 02/2018   right side  . Hyperhomocysteinemia ([place])   . Neurogenic bladder 02/2018   04/26/18 inporving  . Spastic hemiparesis (HCC)    right side  . Stroke ([place])    Left MCA infart  . Tobacco abuse     Past Surgical History:  Procedure Laterality Date  . CRANIOTOMY Left 02/21/2018   Procedure: DECOMPRESSIVE CRANIECTOMY with bone flap to abdomen;  Surgeon: [name], MD;  Location: [facility];  Service: Neurosurgery;  Laterality: Left;  DECOMPRESSIVE CRANIECTOMY with bone flap to abdomen  . IR ANGIO INTRA EXTRACRAN SEL COM CAROTID INNOMINATE UNI R MOD SED  02/19/2018  . IR ANGIO VERTEBRAL SEL VERTEBRAL BILAT MOD SED  02/19/2018  . IR CT HEAD LTD  02/19/2018  . IR PERCUTANEOUS ART THROMBECTOMY/INFUSION INTRACRANIAL INC DIAG ANGIO  02/19/2018  . IR US GUIDE VASC ACCESS RIGHT  02/19/2018  . RADIOLOGY WITH ANESTHESIA N/A 02/19/2018   Procedure: IR WITH ANESTHESIA CODE STROKE;  Surgeon: Corrie Mckusick, DO;  Location: [place];  Service: Anesthesiology;  Laterality: N/A;  . TEE WITHOUT CARDIOVERSION N/A 03/01/2018   Procedure: TRANSESOPHAGEAL ECHOCARDIOGRAM (TEE);  Surgeon: Sanda Klein, MD;  Location: [facility] ENDOSCOPY;  Service: Cardiovascular;  Laterality: N/A;    There were no  vitals filed for this visit.  Subjective Assessment - 04/28/18 1223    Subjective  "right"            ADULT SLP TREATMENT - 04/28/18 0001      General Information   Behavior/Cognition  Alert;Cooperative;Pleasant mood    HPI   37 year old man with left MCA CVA 02/19/2018.  Patient received inpatient rehab services, including SLP, for 5 weeks.       Treatment Provided   Treatment provided  Cognitive-Linquistic      Pain Assessment   Pain Assessment  No/denies pain      Cognitive-Linquistic Treatment   Treatment focused on  Aphasia;Apraxia    Skilled Treatment  SPEECH: Patient able to consistently hum in unison and to command.  Hum into "me" with 70% accuracy and hum into "moo" sporadically.  Patient was not able to hum into other vowels this session.  Imitate 6 words, is not able to repeat X3 while maintaining phonemic accuracy.  Count to 10, in unison, with 60% accuracy.  COMPREHENSION:  Answer 3-unit yes no questions with 95% accuracy.  Follow 2-unit body part commands with 65% accuracy.  Identify stated picture (f=12) with 80% accuracy.    Match word to picture with 80% accuracy.      Assessment / Recommendations / Plan   Plan  Continue with current plan of care  Progression Toward Goals   Progression toward goals  Progressing toward goals       SLP Education - 04/28/18 1223    Education Details  multi-modal communication    Person(s) Educated  Patient    Methods  Explanation    Comprehension  Verbalized understanding         SLP Long Term Goals - 04/14/18 1220      SLP LONG TERM GOAL #1   Title  Patient will name objects using multi-modal strategies with 80% accuracy.    Time  12    Period  Weeks    Status  New    Target Date  07/13/18      SLP LONG TERM GOAL #2   Title  Patient will read aloud or repeat words, maintaining phonemic accuracy, with 80% accuracy.      Time  12    Period  Weeks    Status  New    Target Date  07/13/18      SLP LONG TERM  GOAL #3   Title  Patient will complete 3 unit processing tasks with 80% accuracy without the need of repetition of task instructions or significant delays in responding.    Time  12    Period  Weeks    Status  New      SLP LONG TERM GOAL #4   Title  Patient will demonstrate reading comprehension for words and phrases with 80% accuracy.     Time  12    Period  Weeks    Status  New    Target Date  07/13/18       Plan - 04/28/18 1224    Clinical Impression Statement  Patient is alert and eager to work on his communication.  He demonstrates good comprehension for moderately complex yes/no questions but has more difficulty with more complex verbal information.  Patient demonstrates difficulty with body part identification.      Speech Therapy Frequency  2x / week    Duration  Other (comment)    Treatment/Interventions  Language facilitation;Multimodal communcation approach;Patient/family education;SLP instruction and feedback    Potential to Achieve Goals  Good    Potential Considerations  Ability to learn/carryover information;Pain level;Family/community support;Co-morbidities;Previous level of function;Cooperation/participation level;Severity of impairments;Medical prognosis    Consulted and Agree with Plan of Care  Patient       Patient will benefit from skilled therapeutic intervention in order to improve the following deficits and impairments:   Aphasia  Apraxia    Problem List Patient Active Problem List   Diagnosis Date Noted  . Neurogenic bowel   . Neurogenic bladder   . Spastic hemiparesis ([place])   . 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 Cerebral infarction due to embolism of left middle cerebral artery (HCC) s/p thrombectomy 03/01/2018  . Carotid artery dissection ([place]) Left 03/01/2018  . Acute ischemic right MCA stroke ([place]) 03/01/2018  . Right hemiparesis ([place])   . Dysphagia, post-stroke   . Global  aphasia   . Leukocytosis   . Folic acid deficiency 32/20/2542  . B12 deficiency 02/23/2018  . Hyperhomocysteinemia ([place]) 02/23/2018  . Smoker    Leroy Sea, MS/CCC- SLP  Lou Miner 04/28/2018, 12:25 PM  [place] MAIN [facility] SERVICES [address] [place], [place], [address] Phone: [phone]   Fax:  [phone]   Name: Xavier Villegas MRN: [id number] Date of Birth: 01-07-1981

## 2018-04-28 NOTE — Therapy (Addendum)
[place] MAIN [facility] SERVICES [address] [place], [place], [address] Phone: [phone]   Fax:  [phone]  Physical Therapy Treatment  Patient Details  Name: Xavier Villegas MRN: [id number] Date of Birth: [birth year] Referring Provider: Reesa Chew   Encounter Date: 04/28/2018  PT End of Session - 04/28/18 1133    Visit Number  4    Number of Visits  25    Date for PT Re-Evaluation  07/14/18    PT Start Time  0160    PT Stop Time  1230    PT Time Calculation (min)  45 min    Equipment Utilized During Treatment  Gait belt    Activity Tolerance  Patient tolerated treatment well    Behavior During Therapy  [facility] for tasks assessed/performed       Past Medical History:  Diagnosis Date  . Aphasia   . GERD (gastroesophageal reflux disease)   . Hemiparesis ([place]) 02/2018   right side  . Hyperhomocysteinemia ([place])   . Neurogenic bladder 02/2018   04/26/18 inporving  . Spastic hemiparesis (HCC)    right side  . Stroke ([place])    Left MCA infart  . Tobacco abuse     Past Surgical History:  Procedure Laterality Date  . CRANIOTOMY Left 02/21/2018   Procedure: DECOMPRESSIVE CRANIECTOMY with bone flap to abdomen;  Surgeon: [name], MD;  Location: [place];  Service: Neurosurgery;  Laterality: Left;  DECOMPRESSIVE CRANIECTOMY with bone flap to abdomen  . IR ANGIO INTRA EXTRACRAN SEL COM CAROTID INNOMINATE UNI R MOD SED  02/19/2018  . IR ANGIO VERTEBRAL SEL VERTEBRAL BILAT MOD SED  02/19/2018  . IR CT HEAD LTD  02/19/2018  . IR PERCUTANEOUS ART THROMBECTOMY/INFUSION INTRACRANIAL INC DIAG ANGIO  02/19/2018  . IR US GUIDE VASC ACCESS RIGHT  02/19/2018  . RADIOLOGY WITH ANESTHESIA N/A 02/19/2018   Procedure: IR WITH ANESTHESIA CODE STROKE;  Surgeon: Corrie Mckusick, DO;  Location: [facility];  Service: Anesthesiology;  Laterality: N/A;  . TEE WITHOUT CARDIOVERSION N/A 03/01/2018   Procedure: TRANSESOPHAGEAL ECHOCARDIOGRAM (TEE);  Surgeon: Sanda Klein, MD;  Location: [facility] ENDOSCOPY;  Service: Cardiovascular;  Laterality: N/A;    There were no vitals filed for this visit.  Subjective Assessment - 04/28/18 1146    Subjective  Patient reports he is doing well today; presents to therapy with girlfriend Xavier Villegas. Pt denies any pain today.     Patient is accompained by:  Family member    Pertinent History  Xavier Villegas  had left MCA infarct and subsequent craniectomy.  He was discharged from rehab several weeks ago. He has been home with his family  He is having occasional spasms on the right side still.Patient has Right hemiparesis, dysphagia, and aphasia secondary to large left MCA infarct with hemorrhagic transformation s/p hemicraniectomy.He had acute ischemic left MCA infarct and subsequent craniotomy - was first admitted on 03/01/18.  He was discharged from hospital on 04/06/18    How long can you stand comfortably?  stand for 20 mins    How long can you walk comfortably?  10 mins    Patient Stated Goals  to walk without AD and live on his own.    Currently in Pain?  No/denies          Treatment Seated: -Hamstring stretch 20 sec holds x3 reps; PT performed stretch with pt's ankle on PT's knee and VCs to not lean to the L  -Quad sets x10 reps with  PT assist for straightening R knee and holding position; VCs for holding muscle contraction -Marching with R LE x10 reps with VCs to maintain upright posture and not lean back or to the L  -HS curls x10 reps with VCs for technique and squeezing the hamstring muscles to gain more motion  Sit <> stand practice with green pad under LLE to increase RLE weight bearing x10 reps without UE support, VCs to push through RLE and maintain forward trunk lean   Side step red tband around ankles in // bars x2 laps, CGA for safety, LUE support for safety, VCs for keeping toes pointed forwards   Weight shift over RLE with stepping stone under LLE; CGA for safety, min A from second PT for minimizing R knee  hyperextension when weight bearing through the R, VCs for upright posture; 30 sec bouts x5 bouts without UE support                     PT Education - 04/28/18 1132    Education Details  weight shift, exercise form/technique    Person(s) Educated  Patient    Methods  Explanation;Demonstration;Verbal cues    Comprehension  Verbalized understanding;Returned demonstration;Verbal cues required;Need further instruction       PT Short Term Goals - 04/21/18 1443      PT SHORT TERM GOAL #1   Title  Patient will be independent in home exercise program to improve strength/mobility for better functional independence with ADLs.    Time  6    Period  Weeks    Status  New    Target Date  06/02/18      PT SHORT TERM GOAL #2   Title  Patient (< 85 years old) will complete five times sit to stand test in < 10 seconds indicating an increased LE strength and improved balance.    Baseline  15.67 sec    Time  6    Period  Weeks    Status  New    Target Date  06/02/18      PT SHORT TERM GOAL #3   Title  Patient will reduce timed up and go to <11 seconds to reduce fall risk and demonstrate improved transfer/gait ability.    Baseline  18.88 sec    Time  6    Period  Weeks    Status  New    Target Date  06/02/18        PT Long Term Goals - 04/21/18 1446      PT LONG TERM GOAL #1   Title  Patient will increase six minute walk test distance to >1000 for progression to community ambulator and improve gait ability    Baseline  585 feet    Time  12    Period  Weeks    Status  New    Target Date  07/14/18      PT LONG TERM GOAL #2   Title  Patient will increase 10 meter walk test to >1.59m/s as to improve gait speed for better community ambulation and to reduce fall risk.    Baseline  .48 m/sec    Time  12    Period  Weeks    Status  New    Target Date  07/14/18      PT LONG TERM GOAL #3   Title  Patient will increase Berg Balance score by > 6 points to demonstrate  decreased fall risk during functional activities.  Baseline  25/56    Time  12    Period  Weeks    Status  New    Target Date  07/14/18      PT LONG TERM GOAL #4   Title  Patient will be require no assist with ascend/descend 3 steps using Least restrictive assistive device.    Baseline  needs rail and cues for sequencing and min assist for RLE ascending the step    Time  12    Period  Weeks    Status  New    Target Date  07/14/18            Plan - 04/28/18 1240    Clinical Impression Statement  Pt tolerated therapy session well. Pt worked on seated LE strengthening and muscle activation of quads, hamstrings, and hip flexors. Pt required VCs to avoid compensation for muscle activation; tactile cuing utilized for correct muscle activation as well. Pt signaled feeling stretch in R hamstrings when straightening the R knee in sitting position. Pt performed standing weight shifting with stepping stone under L foot to increase R weight shift; required CGA for safety without UE support and min A for avoiding R knee hyperextension when weight is shifted over the R leg. Pt more talkative with yes, no, I don't know, and providing signals to communicate. Pt's girlfriend also reported pt has been more talkative and playful lately. Pt will continue to benefit from skilled PT intervention to improve strength, mobility, balance, gait, and improve quality of life.     Rehab Potential  Good    PT Frequency  2x / week    PT Duration  12 weeks    PT Treatment/Interventions  Manual techniques;Patient/family education;Neuromuscular re-education;Balance training;Functional mobility training;Therapeutic activities;Therapeutic exercise;Gait training;Orthotic Fit/Training;Passive range of motion    PT Next Visit Plan  strengthening, balance, gait training    PT Home Exercise Plan  standing abd bilaterally x 15    Consulted and Agree with Plan of Care  Patient;Family member/caregiver       Patient will  benefit from skilled therapeutic intervention in order to improve the following deficits and impairments:  Abnormal gait, Decreased balance, Decreased endurance, Decreased mobility, Difficulty walking, Impaired sensation, Increased muscle spasms, Impaired UE functional use, Impaired flexibility, Decreased strength, Decreased coordination, Decreased activity tolerance  Visit Diagnosis: Muscle weakness (generalized)  Difficulty in walking, not elsewhere classified  Other abnormalities of gait and mobility  Hemiplegia and hemiparesis following cerebral infarction affecting right dominant side [facility])     Problem List Patient Active Problem List   Diagnosis Date Noted  . Neurogenic bowel   . Neurogenic bladder   . Spastic hemiparesis ([place])   . 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 Cerebral infarction due to embolism of left middle cerebral artery (HCC) s/p thrombectomy 03/01/2018  . Carotid artery dissection ([place]) Left 03/01/2018  . Acute ischemic right MCA stroke ([place]) 03/01/2018  . Right hemiparesis ([place])   . Dysphagia, post-stroke   . Global aphasia   . Leukocytosis   . Folic acid deficiency 99/24/2683  . B12 deficiency 02/23/2018  . Hyperhomocysteinemia ([place]) 02/23/2018  . Smoker    Harriet Masson, SPT This entire session was performed under direct supervision and direction of a licensed therapist/therapist assistant . I have personally read, edited and approve of the note as written.  Trotter,Margaret PT, DPT 04/28/2018, 2:52 PM  [place] MAIN [facility] SERVICES [address]  [place], [place], [address] Phone: [phone]   Fax:  [phone]  Name: Xavier Villegas MRN: [id number] Date of Birth: [birth year]
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
## 2018-04-29 MED ORDER — CEFAZOLIN SODIUM-DEXTROSE 2-4 GM/100ML-% IV SOLN
2.0000 g | INTRAVENOUS | Status: AC
  Administered 2018-05-02: 2 g via INTRAVENOUS
  Filled 2018-04-29: qty 100

## 2018-04-29 MED ORDER — VANCOMYCIN HCL IN DEXTROSE 1-5 GM/200ML-% IV SOLN
1000.0000 mg | INTRAVENOUS | Status: AC
  Administered 2018-05-02: 1000 mg via INTRAVENOUS
  Filled 2018-04-29: qty 200

## 2018-04-30 LAB — CULTURE, BLOOD (ROUTINE X 2)
CULTURE: NO GROWTH
Culture: NO GROWTH
SPECIAL REQUESTS: ADEQUATE

## 2018-05-01 NOTE — Anesthesia Preprocedure Evaluation (Addendum)
Anesthesia Evaluation  Patient identified by MRN, date of birth, ID band Patient awake    Reviewed: Allergy & Precautions, NPO status , Patient's Chart, lab work & pertinent test results  Airway Mallampati: II  TM Distance: >3 FB Neck ROM: Full    Dental no notable dental hx. (+) Teeth Intact, Dental Advisory Given   Pulmonary former smoker,    Pulmonary exam normal breath sounds clear to auscultation       Cardiovascular + Peripheral Vascular Disease  Normal cardiovascular exam Rhythm:Regular Rate:Normal  Echo 03/01/2018 TEE 03/01/18: Study Conclusions - Left ventricle: The cavity size was normal. Wall thickness was normal. Systolic function was normal. The estimated ejection fraction was in the range of 55% to 60%. Wall motion was normal; there were no regional wall motion abnormalities. - Left atrium: No evidence of thrombus in the atrial cavity or appendage. - Right atrium: No evidence of thrombus in the atrial cavity or appendage. - Atrial septum: No defect or patent foramen ovale was identified. Echo contrast study showed no right-to-left atrial level shunt, at baseline or with provocation. Impressions: - Normal TEE. Specifically, there are no masses or vegetations on the aortic valve.   Neuro/Psych L MCA cva w concomttant R sided Weakness apasia CVA, Residual Symptoms    GI/Hepatic Neg liver ROS, GERD  ,  Endo/Other    Renal/GU negative Renal ROS     Musculoskeletal negative musculoskeletal ROS (+)   Abdominal   Peds  Hematology negative hematology ROS (+)   Anesthesia Other Findings Pt w expressive  aphasia but able to move right side some.   Reproductive/Obstetrics                           Anesthesia Physical Anesthesia Plan  ASA: II  Anesthesia Plan: General   Post-op Pain Management:    Induction: Intravenous  PONV Risk Score and Plan: 2 and  Treatment may vary due to age or medical condition, Ondansetron and Dexamethasone  Airway Management Planned: Oral ETT  Additional Equipment: Arterial line  Intra-op Plan:   Post-operative Plan: Possible Post-op intubation/ventilation and Extubation in OR  Informed Consent: I have reviewed the patients History and Physical, chart, labs and discussed the procedure including the risks, benefits and alternatives for the proposed anesthesia with the patient or authorized representative who has indicated his/her understanding and acceptance.   Dental advisory given  Plan Discussed with: CRNA  Anesthesia Plan Comments:        Anesthesia Quick Evaluation

## 2018-05-02 ENCOUNTER — Ambulatory Visit: Payer: BLUE CROSS/BLUE SHIELD | Admitting: Physical Therapy

## 2018-05-02 ENCOUNTER — Ambulatory Visit: Payer: BLUE CROSS/BLUE SHIELD | Admitting: Speech Pathology

## 2018-05-02 ENCOUNTER — Inpatient Hospital Stay (HOSPITAL_COMMUNITY)
Admission: RE | Admit: 2018-05-02 | Discharge: 2018-05-04 | DRG: 516 | Disposition: A | Discharge status: Home / Self Care | Payer: BLUE CROSS/BLUE SHIELD | Source: Ambulatory Visit | Attending: Neurosurgery | Admitting: Neurosurgery

## 2018-05-02 ENCOUNTER — Inpatient Hospital Stay (HOSPITAL_COMMUNITY): Payer: BLUE CROSS/BLUE SHIELD | Admitting: Vascular Surgery

## 2018-05-02 ENCOUNTER — Encounter (HOSPITAL_COMMUNITY): Payer: Self-pay

## 2018-05-02 ENCOUNTER — Encounter (HOSPITAL_COMMUNITY)
Admission: RE | Disposition: A | Discharge status: Home / Self Care | Payer: Self-pay | Source: Ambulatory Visit | Attending: Neurosurgery

## 2018-05-02 ENCOUNTER — Encounter: Payer: BLUE CROSS/BLUE SHIELD | Admitting: Occupational Therapy

## 2018-05-02 DIAGNOSIS — Z8673 Personal history of transient ischemic attack (TIA), and cerebral infarction without residual deficits: Secondary | ICD-10-CM

## 2018-05-02 DIAGNOSIS — Z7982 Long term (current) use of aspirin: Secondary | ICD-10-CM | POA: Diagnosis not present

## 2018-05-02 DIAGNOSIS — Z79899 Other long term (current) drug therapy: Secondary | ICD-10-CM | POA: Diagnosis not present

## 2018-05-02 DIAGNOSIS — K219 Gastro-esophageal reflux disease without esophagitis: Secondary | ICD-10-CM | POA: Diagnosis not present

## 2018-05-02 DIAGNOSIS — G8111 Spastic hemiplegia affecting right dominant side: Secondary | ICD-10-CM | POA: Diagnosis present

## 2018-05-02 DIAGNOSIS — M952 Other acquired deformity of head: Secondary | ICD-10-CM | POA: Diagnosis not present

## 2018-05-02 DIAGNOSIS — Z23 Encounter for immunization: Secondary | ICD-10-CM

## 2018-05-02 DIAGNOSIS — N319 Neuromuscular dysfunction of bladder, unspecified: Secondary | ICD-10-CM | POA: Diagnosis present

## 2018-05-02 DIAGNOSIS — I639 Cerebral infarction, unspecified: Secondary | ICD-10-CM | POA: Diagnosis present

## 2018-05-02 DIAGNOSIS — R4702 Dysphasia: Secondary | ICD-10-CM | POA: Diagnosis present

## 2018-05-02 DIAGNOSIS — Z87891 Personal history of nicotine dependence: Secondary | ICD-10-CM

## 2018-05-02 DIAGNOSIS — R4701 Aphasia: Secondary | ICD-10-CM | POA: Diagnosis present

## 2018-05-02 HISTORY — DX: Gastro-esophageal reflux disease without esophagitis: K21.9

## 2018-05-02 HISTORY — DX: Spastic hemiplegia affecting unspecified side: G81.10

## 2018-05-02 HISTORY — DX: Neuromuscular dysfunction of bladder, unspecified: N31.9

## 2018-05-02 HISTORY — DX: Homocystinuria: E72.11

## 2018-05-02 HISTORY — PX: CRANIOTOMY: SHX93

## 2018-05-02 HISTORY — DX: Aphasia: R47.01

## 2018-05-02 HISTORY — DX: Hemiplegia, unspecified affecting unspecified side: G81.90

## 2018-05-02 LAB — BASIC METABOLIC PANEL
Anion gap: 15 (ref 5–15)
BUN: 8 mg/dL (ref 6–20)
CALCIUM: 9.4 mg/dL (ref 8.9–10.3)
CO2: 22 mmol/L (ref 22–32)
Chloride: 100 mmol/L (ref 98–111)
Creatinine, Ser: 1.01 mg/dL (ref 0.61–1.24)
GFR calc Af Amer: >60 mL/min (ref >60)
GLUCOSE: 117 mg/dL — AB (ref 70–99)
Potassium: 4.2 mmol/L (ref 3.5–5.1)
Sodium: 137 mmol/L (ref 135–145)

## 2018-05-02 LAB — CBC
HCT: 43.9 % (ref 39.0–52.0)
Hemoglobin: 15.3 g/dL (ref 13.0–17.0)
MCH: 33 pg (ref 26.0–34.0)
MCHC: 34.9 g/dL (ref 30.0–36.0)
MCV: 94.6 fL (ref 78.0–100.0)
Platelets: 317 10*3/uL (ref 150–400)
RBC: 4.64 MIL/uL (ref 4.22–5.81)
RDW: 12.9 % (ref 11.5–15.5)
WBC: 16.4 10*3/uL — ABNORMAL HIGH (ref 4.0–10.5)

## 2018-05-02 LAB — MRSA PCR SCREENING: MRSA BY PCR: NEGATIVE

## 2018-05-02 SURGERY — CRANIOTOMY BONE FLAP/PROSTHETIC PLATE
Anesthesia: General

## 2018-05-02 MED ORDER — CHLORHEXIDINE GLUCONATE CLOTH 2 % EX PADS: 6.0000 | MEDICATED_PAD | Freq: Once | CUTANEOUS | Status: DC

## 2018-05-02 MED ORDER — MIDAZOLAM HCL 2 MG/2ML IJ SOLN
INTRAMUSCULAR | Status: AC
  Filled 2018-05-02: qty 2

## 2018-05-02 MED ORDER — ONDANSETRON HCL 4 MG/2ML IJ SOLN: 4.0000 mg | Freq: Once | INTRAMUSCULAR | Status: DC | PRN

## 2018-05-02 MED ORDER — PROPOFOL 10 MG/ML IV BOLUS
INTRAVENOUS | Status: AC
  Filled 2018-05-02: qty 20

## 2018-05-02 MED ORDER — ASPIRIN 325 MG PO TABS
325.0000 mg | ORAL_TABLET | Freq: Every day | ORAL | Status: DC
  Administered 2018-05-03 – 2018-05-04 (×2): 325 mg via ORAL
  Filled 2018-05-02 (×2): qty 1

## 2018-05-02 MED ORDER — BACITRACIN ZINC 500 UNIT/GM EX OINT
TOPICAL_OINTMENT | CUTANEOUS | Status: AC
  Filled 2018-05-02: qty 28.35

## 2018-05-02 MED ORDER — CEFAZOLIN SODIUM-DEXTROSE 2-4 GM/100ML-% IV SOLN
2.0000 g | Freq: Three times a day (TID) | INTRAVENOUS | Status: AC
  Administered 2018-05-02 – 2018-05-04 (×6): 2 g via INTRAVENOUS
  Filled 2018-05-02 (×6): qty 100

## 2018-05-02 MED ORDER — DEXAMETHASONE SODIUM PHOSPHATE 10 MG/ML IJ SOLN
INTRAMUSCULAR | Status: DC | PRN
  Administered 2018-05-02: 10 mg via INTRAVENOUS

## 2018-05-02 MED ORDER — VANCOMYCIN HCL 1000 MG IV SOLR: INTRAVENOUS | Status: DC | PRN

## 2018-05-02 MED ORDER — FENTANYL CITRATE (PF) 250 MCG/5ML IJ SOLN
INTRAMUSCULAR | Status: AC
  Filled 2018-05-02: qty 5

## 2018-05-02 MED ORDER — PROMETHAZINE HCL 12.5 MG PO TABS
12.5000 mg | ORAL_TABLET | ORAL | Status: DC | PRN
  Filled 2018-05-02: qty 2

## 2018-05-02 MED ORDER — ONDANSETRON HCL 4 MG PO TABS: 4.0000 mg | ORAL_TABLET | ORAL | Status: DC | PRN

## 2018-05-02 MED ORDER — VITAMIN B-12 1000 MCG PO TABS
1000.0000 ug | ORAL_TABLET | Freq: Every day | ORAL | Status: DC
  Administered 2018-05-02 – 2018-05-04 (×3): 1000 ug via ORAL
  Filled 2018-05-02 (×3): qty 1

## 2018-05-02 MED ORDER — SULFAMETHOXAZOLE-TRIMETHOPRIM 800-160 MG PO TABS
1.0000 | ORAL_TABLET | Freq: Two times a day (BID) | ORAL | Status: AC
  Administered 2018-05-02 – 2018-05-03 (×3): 1 via ORAL
  Filled 2018-05-02 (×3): qty 1

## 2018-05-02 MED ORDER — LIDOCAINE-EPINEPHRINE 1 %-1:100000 IJ SOLN
INTRAMUSCULAR | Status: AC
  Filled 2018-05-02: qty 1

## 2018-05-02 MED ORDER — LIDOCAINE-EPINEPHRINE 1 %-1:100000 IJ SOLN
INTRAMUSCULAR | Status: DC | PRN
  Administered 2018-05-02: 20 mL

## 2018-05-02 MED ORDER — PANTOPRAZOLE SODIUM 40 MG PO TBEC
40.0000 mg | DELAYED_RELEASE_TABLET | Freq: Every day | ORAL | Status: DC
  Administered 2018-05-02 – 2018-05-04 (×3): 40 mg via ORAL
  Filled 2018-05-02 (×3): qty 1

## 2018-05-02 MED ORDER — ONDANSETRON HCL 4 MG/2ML IJ SOLN
INTRAMUSCULAR | Status: DC | PRN
  Administered 2018-05-02: 4 mg via INTRAVENOUS

## 2018-05-02 MED ORDER — FENTANYL CITRATE (PF) 100 MCG/2ML IJ SOLN
25.0000 ug | INTRAMUSCULAR | Status: DC | PRN
  Administered 2018-05-02 (×2): 50 ug via INTRAVENOUS

## 2018-05-02 MED ORDER — THROMBIN 5000 UNITS EX SOLR
CUTANEOUS | Status: DC | PRN
  Administered 2018-05-02 (×2): 5000 [IU] via TOPICAL

## 2018-05-02 MED ORDER — ACETAMINOPHEN 325 MG PO TABS
650.0000 mg | ORAL_TABLET | Freq: Every day | ORAL | Status: DC | PRN
  Administered 2018-05-03: 650 mg via ORAL
  Filled 2018-05-02: qty 2

## 2018-05-02 MED ORDER — LABETALOL HCL 5 MG/ML IV SOLN
INTRAVENOUS | Status: AC
  Administered 2018-05-02: 10 mg via INTRAVENOUS
  Filled 2018-05-02: qty 4

## 2018-05-02 MED ORDER — LABETALOL HCL 5 MG/ML IV SOLN
10.0000 mg | Freq: Once | INTRAVENOUS | Status: AC
  Administered 2018-05-02: 10 mg via INTRAVENOUS

## 2018-05-02 MED ORDER — SUGAMMADEX SODIUM 200 MG/2ML IV SOLN
INTRAVENOUS | Status: DC | PRN
  Administered 2018-05-02: 200 mg via INTRAVENOUS

## 2018-05-02 MED ORDER — HYDROCODONE-ACETAMINOPHEN 7.5-325 MG PO TABS: 1.0000 | ORAL_TABLET | Freq: Once | ORAL | Status: DC | PRN

## 2018-05-02 MED ORDER — PANTOPRAZOLE SODIUM 40 MG PO TBEC: 40.0000 mg | DELAYED_RELEASE_TABLET | Freq: Every day | ORAL | Status: DC

## 2018-05-02 MED ORDER — MEPERIDINE HCL 50 MG/ML IJ SOLN: 6.2500 mg | INTRAMUSCULAR | Status: DC | PRN

## 2018-05-02 MED ORDER — LIDOCAINE 2% (20 MG/ML) 5 ML SYRINGE
INTRAMUSCULAR | Status: AC
  Filled 2018-05-02: qty 5

## 2018-05-02 MED ORDER — ONDANSETRON HCL 4 MG/2ML IJ SOLN
INTRAMUSCULAR | Status: AC
  Filled 2018-05-02: qty 2

## 2018-05-02 MED ORDER — PHENYLEPHRINE 40 MCG/ML (10ML) SYRINGE FOR IV PUSH (FOR BLOOD PRESSURE SUPPORT)
PREFILLED_SYRINGE | INTRAVENOUS | Status: AC
  Filled 2018-05-02: qty 10

## 2018-05-02 MED ORDER — ACETAMINOPHEN 10 MG/ML IV SOLN: 1000.0000 mg | Freq: Once | INTRAVENOUS | Status: DC | PRN

## 2018-05-02 MED ORDER — FOLIC ACID 1 MG PO TABS
1.0000 mg | ORAL_TABLET | Freq: Four times a day (QID) | ORAL | Status: DC
  Administered 2018-05-02 – 2018-05-04 (×8): 1 mg via ORAL
  Filled 2018-05-02 (×8): qty 1

## 2018-05-02 MED ORDER — FENTANYL CITRATE (PF) 100 MCG/2ML IJ SOLN
INTRAMUSCULAR | Status: DC | PRN
  Administered 2018-05-02: 50 ug via INTRAVENOUS
  Administered 2018-05-02: 150 ug via INTRAVENOUS
  Administered 2018-05-02 (×4): 50 ug via INTRAVENOUS

## 2018-05-02 MED ORDER — BACLOFEN 10 MG PO TABS
10.0000 mg | ORAL_TABLET | Freq: Four times a day (QID) | ORAL | Status: DC
  Administered 2018-05-02 – 2018-05-04 (×8): 10 mg via ORAL
  Filled 2018-05-02 (×8): qty 1

## 2018-05-02 MED ORDER — SODIUM CHLORIDE 0.9 % IV SOLN
INTRAVENOUS | Status: DC | PRN
  Administered 2018-05-02: 09:00:00 via INTRAVENOUS

## 2018-05-02 MED ORDER — POTASSIUM CHLORIDE IN NACL 20-0.9 MEQ/L-% IV SOLN
INTRAVENOUS | Status: DC
  Administered 2018-05-02 – 2018-05-04 (×3): via INTRAVENOUS
  Filled 2018-05-02 (×4): qty 1000

## 2018-05-02 MED ORDER — ONDANSETRON HCL 4 MG/2ML IJ SOLN: 4.0000 mg | INTRAMUSCULAR | Status: DC | PRN

## 2018-05-02 MED ORDER — HYDROMORPHONE HCL 1 MG/ML IJ SOLN
0.5000 mg | INTRAMUSCULAR | Status: DC | PRN
  Administered 2018-05-02 – 2018-05-03 (×4): 0.5 mg via INTRAVENOUS
  Filled 2018-05-02 (×4): qty 1

## 2018-05-02 MED ORDER — HEMOSTATIC AGENTS (NO CHARGE) OPTIME
TOPICAL | Status: DC | PRN
  Administered 2018-05-02: 1

## 2018-05-02 MED ORDER — LABETALOL HCL 5 MG/ML IV SOLN
INTRAVENOUS | Status: AC
  Filled 2018-05-02: qty 4

## 2018-05-02 MED ORDER — 0.9 % SODIUM CHLORIDE (POUR BTL) OPTIME
TOPICAL | Status: DC | PRN
  Administered 2018-05-02: 3000 mL

## 2018-05-02 MED ORDER — THROMBIN 5000 UNITS EX SOLR
CUTANEOUS | Status: AC
  Filled 2018-05-02: qty 10000

## 2018-05-02 MED ORDER — VITAMIN B-6 50 MG PO TABS
50.0000 mg | ORAL_TABLET | Freq: Every day | ORAL | Status: DC
  Administered 2018-05-02 – 2018-05-04 (×3): 50 mg via ORAL
  Filled 2018-05-02 (×3): qty 1

## 2018-05-02 MED ORDER — LACTATED RINGERS IV SOLN
INTRAVENOUS | Status: DC
  Administered 2018-05-02 (×2): via INTRAVENOUS

## 2018-05-02 MED ORDER — LABETALOL HCL 5 MG/ML IV SOLN
10.0000 mg | INTRAVENOUS | Status: DC | PRN
  Administered 2018-05-02 – 2018-05-03 (×2): 10 mg via INTRAVENOUS
  Filled 2018-05-02 (×2): qty 4

## 2018-05-02 MED ORDER — ROCURONIUM BROMIDE 50 MG/5ML IV SOSY
PREFILLED_SYRINGE | INTRAVENOUS | Status: AC
  Filled 2018-05-02: qty 10

## 2018-05-02 MED ORDER — DOCUSATE SODIUM 100 MG PO CAPS
100.0000 mg | ORAL_CAPSULE | Freq: Two times a day (BID) | ORAL | Status: DC
  Administered 2018-05-02 – 2018-05-04 (×4): 100 mg via ORAL
  Filled 2018-05-02 (×4): qty 1

## 2018-05-02 MED ORDER — ROCURONIUM BROMIDE 50 MG/5ML IV SOSY
PREFILLED_SYRINGE | INTRAVENOUS | Status: DC | PRN
  Administered 2018-05-02: 50 mg via INTRAVENOUS
  Administered 2018-05-02: 25 mg via INTRAVENOUS

## 2018-05-02 MED ORDER — LIDOCAINE 2% (20 MG/ML) 5 ML SYRINGE
INTRAMUSCULAR | Status: DC | PRN
  Administered 2018-05-02: 100 mg via INTRAVENOUS

## 2018-05-02 MED ORDER — DEXAMETHASONE SODIUM PHOSPHATE 10 MG/ML IJ SOLN
INTRAMUSCULAR | Status: AC
  Filled 2018-05-02: qty 1

## 2018-05-02 MED ORDER — SODIUM CHLORIDE 0.9 % IV SOLN
INTRAVENOUS | Status: DC | PRN
  Administered 2018-05-02: 25 ug/min via INTRAVENOUS

## 2018-05-02 MED ORDER — FENTANYL CITRATE (PF) 100 MCG/2ML IJ SOLN
INTRAMUSCULAR | Status: AC
  Administered 2018-05-02: 50 ug via INTRAVENOUS
  Filled 2018-05-02: qty 2

## 2018-05-02 MED ORDER — CEPHALEXIN 500 MG PO CAPS: 500.0000 mg | ORAL_CAPSULE | Freq: Four times a day (QID) | ORAL | Status: DC

## 2018-05-02 MED ORDER — PROPOFOL 10 MG/ML IV BOLUS
INTRAVENOUS | Status: DC | PRN
  Administered 2018-05-02: 20 mg via INTRAVENOUS
  Administered 2018-05-02: 30 mg via INTRAVENOUS
  Administered 2018-05-02: 170 mg via INTRAVENOUS

## 2018-05-02 MED ORDER — SODIUM CHLORIDE 0.9 % IV SOLN
INTRAVENOUS | Status: DC | PRN
  Administered 2018-05-02: 500 mL

## 2018-05-02 SURGICAL SUPPLY — 81 items
BAG DECANTER FOR FLEXI CONT (MISCELLANEOUS) ×2 IMPLANT
BANDAGE GAUZE 4  KLING STR (GAUZE/BANDAGES/DRESSINGS) IMPLANT
BENZOIN TINCTURE PRP APPL 2/3 (GAUZE/BANDAGES/DRESSINGS) ×2 IMPLANT
BLADE CLIPPER SURG (BLADE) ×2 IMPLANT
BNDG COHESIVE 4X5 TAN NS LF (GAUZE/BANDAGES/DRESSINGS) IMPLANT
BNDG GAUZE ELAST 4 BULKY (GAUZE/BANDAGES/DRESSINGS) ×2 IMPLANT
BNDG STRETCH 4X75 NS LF (GAUZE/BANDAGES/DRESSINGS) ×2 IMPLANT
BUR SPIRAL ROUTER 2.3 (BUR) IMPLANT
CABLE BIPOLOR RESECTION CORD (MISCELLANEOUS) ×2 IMPLANT
CANISTER SUCT 3000ML PPV (MISCELLANEOUS) ×2 IMPLANT
CARTRIDGE OIL MAESTRO DRILL (MISCELLANEOUS) IMPLANT
CLIP VESOCCLUDE MED 6/CT (CLIP) IMPLANT
DECANTER SPIKE VIAL GLASS SM (MISCELLANEOUS) ×2 IMPLANT
DERMABOND ADVANCED (GAUZE/BANDAGES/DRESSINGS) ×1
DERMABOND ADVANCED .7 DNX12 (GAUZE/BANDAGES/DRESSINGS) ×1 IMPLANT
DIFFUSER DRILL AIR PNEUMATIC (MISCELLANEOUS) IMPLANT
DRAIN SNY WOU 7FLT (WOUND CARE) ×2 IMPLANT
DRAPE INCISE IOBAN 66X45 STRL (DRAPES) ×2 IMPLANT
DRAPE NEUROLOGICAL W/INCISE (DRAPES) ×2 IMPLANT
DRAPE SURG 17X23 STRL (DRAPES) ×4 IMPLANT
DRSG OPSITE 4X5.5 SM (GAUZE/BANDAGES/DRESSINGS) ×2 IMPLANT
DRSG OPSITE POSTOP 4X8 (GAUZE/BANDAGES/DRESSINGS) ×2 IMPLANT
ELECT CAUTERY BLADE 6.4 (BLADE) ×2 IMPLANT
ELECT REM PT RETURN 9FT ADLT (ELECTROSURGICAL) ×2
ELECTRODE REM PT RTRN 9FT ADLT (ELECTROSURGICAL) ×1 IMPLANT
EVACUATOR SILICONE 100CC (DRAIN) ×2 IMPLANT
GAUZE 4X4 16PLY RFD (DISPOSABLE) IMPLANT
GAUZE SPONGE 4X4 12PLY STRL (GAUZE/BANDAGES/DRESSINGS) ×2 IMPLANT
GLOVE BIO SURGEON STRL SZ 6.5 (GLOVE) IMPLANT
GLOVE BIO SURGEON STRL SZ7 (GLOVE) IMPLANT
GLOVE BIO SURGEON STRL SZ7.5 (GLOVE) IMPLANT
GLOVE BIO SURGEON STRL SZ8 (GLOVE) ×2 IMPLANT
GLOVE BIO SURGEON STRL SZ8.5 (GLOVE) IMPLANT
GLOVE BIOGEL M 8.0 STRL (GLOVE) IMPLANT
GLOVE BIOGEL PI IND STRL 7.0 (GLOVE) IMPLANT
GLOVE BIOGEL PI INDICATOR 7.0 (GLOVE)
GLOVE ECLIPSE 6.5 STRL STRAW (GLOVE) IMPLANT
GLOVE ECLIPSE 7.0 STRL STRAW (GLOVE) IMPLANT
GLOVE ECLIPSE 7.5 STRL STRAW (GLOVE) IMPLANT
GLOVE ECLIPSE 8.0 STRL XLNG CF (GLOVE) IMPLANT
GLOVE ECLIPSE 8.5 STRL (GLOVE) IMPLANT
GLOVE INDICATOR 8.5 STRL (GLOVE) ×2 IMPLANT
GLOVE OPTIFIT SS 8.0 STRL (GLOVE) IMPLANT
GLOVE SURG SS PI 6.5 STRL IVOR (GLOVE) IMPLANT
GOWN STRL REUS W/ TWL LRG LVL3 (GOWN DISPOSABLE) ×2 IMPLANT
GOWN STRL REUS W/ TWL XL LVL3 (GOWN DISPOSABLE) ×1 IMPLANT
GOWN STRL REUS W/TWL 2XL LVL3 (GOWN DISPOSABLE) IMPLANT
GOWN STRL REUS W/TWL LRG LVL3 (GOWN DISPOSABLE) ×2
GOWN STRL REUS W/TWL XL LVL3 (GOWN DISPOSABLE) ×1
HEMOSTAT SURGICEL 2X14 (HEMOSTASIS) IMPLANT
KIT BASIN OR (CUSTOM PROCEDURE TRAY) ×2 IMPLANT
KIT TURNOVER KIT B (KITS) ×2 IMPLANT
NEEDLE HYPO 22GX1.5 SAFETY (NEEDLE) ×4 IMPLANT
NS IRRIG 1000ML POUR BTL (IV SOLUTION) ×6 IMPLANT
OIL CARTRIDGE MAESTRO DRILL (MISCELLANEOUS)
PACK CRANIOTOMY CUSTOM (CUSTOM PROCEDURE TRAY) ×2 IMPLANT
PAD ARMBOARD 7.5X6 YLW CONV (MISCELLANEOUS) ×6 IMPLANT
PATTIES SURGICAL .25X.25 (GAUZE/BANDAGES/DRESSINGS) IMPLANT
PATTIES SURGICAL .5 X.5 (GAUZE/BANDAGES/DRESSINGS) IMPLANT
PATTIES SURGICAL .5 X3 (DISPOSABLE) IMPLANT
PATTIES SURGICAL .75X.75 (GAUZE/BANDAGES/DRESSINGS) IMPLANT
PATTIES SURGICAL 1X1 (DISPOSABLE) IMPLANT
PIN MAYFIELD SKULL DISP (PIN) IMPLANT
PLATE 1.5 2H 17 DOU T (Plate) ×4 IMPLANT
PLATE 1.5 6HOLE XLONG DBL Y (Plate) ×2 IMPLANT
PLATE 1.5/0.5 22MM BURR HO (Plate) ×2 IMPLANT
SCREW SELF DRILL HT 1.5/4MM (Screw) ×34 IMPLANT
SPONGE NEURO XRAY DETECT 1X3 (DISPOSABLE) IMPLANT
SPONGE SURGIFOAM ABS GEL 100 (HEMOSTASIS) IMPLANT
SPONGE SURGIFOAM ABS GEL SZ50 (HEMOSTASIS) ×2 IMPLANT
STAPLER SKIN PROX WIDE 3.9 (STAPLE) ×4 IMPLANT
STRIP CLOSURE SKIN 1/2X4 (GAUZE/BANDAGES/DRESSINGS) ×2 IMPLANT
SUT NURALON 4 0 TR CR/8 (SUTURE) IMPLANT
SUT VIC AB 2-0 CT1 18 (SUTURE) ×8 IMPLANT
SUT VICRYL 4-0 PS2 18IN ABS (SUTURE) ×2 IMPLANT
SYR CONTROL 10ML LL (SYRINGE) ×2 IMPLANT
TOWEL GREEN STERILE (TOWEL DISPOSABLE) ×4 IMPLANT
TOWEL GREEN STERILE FF (TOWEL DISPOSABLE) ×2 IMPLANT
TRAY FOLEY MTR SLVR 16FR STAT (SET/KITS/TRAYS/PACK) ×2 IMPLANT
UNDERPAD 30X30 (UNDERPADS AND DIAPERS) IMPLANT
WATER STERILE IRR 1000ML POUR (IV SOLUTION) ×2 IMPLANT

## 2018-05-02 NOTE — Anesthesia Postprocedure Evaluation (Signed)
Anesthesia Post Note  Patient: Xavier Villegas  Procedure(s) Performed: RE-IMPLANTATION OF CRANIAL FLAP (N/A )     Patient location during evaluation: PACU Anesthesia Type: General Level of consciousness: awake and alert Pain management: pain level controlled Vital Signs Assessment: post-procedure vital signs reviewed and stable Respiratory status: spontaneous breathing, nonlabored ventilation, respiratory function stable and patient connected to nasal cannula oxygen Cardiovascular status: blood pressure returned to baseline and stable Postop Assessment: no apparent nausea or vomiting Anesthetic complications: no    Last Vitals:  Vitals:   05/02/18 1430 05/02/18 1445  BP: 134/86 139/81  Pulse: 80 92  Resp: 14 13  Temp: 36.8 C   SpO2: 97% 97%    Last Pain:  Vitals:   05/02/18 1145  TempSrc:   PainSc: [place] A Terrel [place]

## 2018-05-02 NOTE — Op Note (Signed)
Preoperative diagnosis: Cranial defect following ischemic cerebrovascular accident with decompressive hemicraniectomy with implantation of the cranial flap and the abdominal wall.  Postoperative diagnosis: Same  Procedure: Reexploration of decompressive hemicraniectomy incision for reimplantation of cranial flap with explant of cranial flap from the left abdominal wall attaching with the Biomet plating system and placement of a JP drain  Surgeon: [name]  Anesthesia: General  EBL: Minimal  HPI: 37 year old gentleman who presented with a stroke was evaluated and due to increased intracranial pressure and inability to control with medications patient underwent decompressive hemicraniectomy 2 to 3 months ago and now presents for reimplantation of his cranial flap which had been implanted in the abdominal wall.  I have extensively gone over the risks and benefits of a reimplantation of his cranial flap with the patient family they understand and agreed to proceed forward.  Operative procedure: Patient was brought into the ER was induced under general anesthesia was positioned supine the head turned to the right with a shoulder bump under his left shoulder left side of his abdominal wall as well as his left frontal and frontoparietal area and pterional incision was exposed both areas were shaved prepped and draped in routine sterile fashion.  Then after infiltration of 10 cc lidocaine with epi the cranial flap was removed from the abdominal incision and this was closed with interrupted Vicryls running 4 subcuticular Dermabond benzoin Steri-Strips and a sterile dressing.  Then attention taken to the pterional incision.  This incision was also opened up I identified the extradural plane and dissected underneath the galea temporalis exposed the cranial defect and then reattached the cranial flap with a Biomet plating system with a bur hole and 3 other T and x-ray plates.  I did contour the inferior aspect of  the flap with a high-speed drill.  The wound was then copiously irrigated to Kassim states was maintained a JP drain was placed under the incision and the incision was closed with interrupted Vicryls in the galea and staples in the skin.  Head was wrapped patient recovery room in stable condition.  At the end the case on the account sponge counts were correct.

## 2018-05-02 NOTE — Anesthesia Procedure Notes (Signed)
Arterial Line Insertion Start/End9/23/2019 7:55 AM, 05/02/2018 7:56 AM Performed by: Inda Coke, CRNA, CRNA  Preanesthetic checklist: patient identified, IV checked, site marked, risks and benefits discussed, surgical consent, monitors and equipment checked, pre-op evaluation, timeout performed and anesthesia consent Patient sedated Right, radial was placed Catheter size: 20 G Hand hygiene performed  and maximum sterile barriers used   Attempts: 2 Procedure performed without using ultrasound guided technique. Ultrasound Notes:anatomy identified Following insertion, dressing applied and Biopatch. Post procedure assessment: normal  Patient tolerated the procedure well with no immediate complications.

## 2018-05-02 NOTE — Anesthesia Procedure Notes (Signed)
Procedure Name: Intubation Date/Time: 05/02/2018 7:38 AM Performed by: Inda Coke, CRNA Pre-anesthesia Checklist: Patient identified, Emergency Drugs available, Suction available and Patient being monitored Patient Re-evaluated:Patient Re-evaluated prior to induction Oxygen Delivery Method: Circle System Utilized Preoxygenation: Pre-oxygenation with 100% oxygen Induction Type: IV induction Ventilation: Mask ventilation without difficulty and Oral airway inserted - appropriate to patient size Laryngoscope Size: Mac and 4 Grade View: Grade I Tube type: Oral Tube size: 7.5 mm Number of attempts: 1 Airway Equipment and Method: Stylet and Oral airway Placement Confirmation: ETT inserted through vocal cords under direct vision,  positive ETCO2 and breath sounds checked- equal and bilateral Secured at: 23 cm Tube secured with: Tape Dental Injury: Teeth and Oropharynx as per pre-operative assessment

## 2018-05-02 NOTE — Transfer of Care (Signed)
Immediate Anesthesia Transfer of Care Note  Patient: Xavier Villegas  Procedure(s) Performed: RE-IMPLANTATION OF CRANIAL FLAP (N/A )  Patient Location: PACU  Anesthesia Type:General  Level of Consciousness: awake and drowsy  Airway & Oxygen Therapy: Patient Spontanous Breathing and Patient connected to nasal cannula oxygen  Post-op Assessment: Report given to RN and Post -op Vital signs reviewed and stable  Post vital signs: Reviewed and stable  Last Vitals:  Vitals Value Taken Time  BP 143/102 05/02/2018  9:44 AM  Temp    Pulse 99 05/02/2018  9:48 AM  Resp 23 05/02/2018  9:48 AM  SpO2 100 % 05/02/2018  9:48 AM  Vitals shown include unvalidated device data.  Last Pain:  Vitals:   05/02/18 0642  TempSrc: Oral  PainSc:          Complications: No apparent anesthesia complications

## 2018-05-02 NOTE — H&P (Signed)
Xavier Villegas is an 37 y.o. male.   Chief Complaint: Cranial defect HPI: 37 year old gentleman who sustained a left hemispheric cerebrovascular accident.  Had to go to the OR emergently for decompressive hemicraniectomy with plantation of cranial flap into the abdominal wall.  Patient now presents for reimplantation of cranial flap into the calvarium.  I have extensively gone over the risks and benefits of the operation with him as well as perioperative course expectations of outcome and alternatives of surgery and he and the family understands and agrees to proceed forward.  Past Medical History:  Diagnosis Date  . Aphasia   . GERD (gastroesophageal reflux disease)   . Hemiparesis ([place]) 02/2018   right side  . Hyperhomocysteinemia ([place])   . Neurogenic bladder 02/2018   04/26/18 inporving  . Spastic hemiparesis (HCC)    right side  . Stroke ([place])    Left MCA infart  . Tobacco abuse     Past Surgical History:  Procedure Laterality Date  . CRANIOTOMY Left 02/21/2018   Procedure: DECOMPRESSIVE CRANIECTOMY with bone flap to abdomen;  Surgeon: [name], MD;  Location: [place];  Service: Neurosurgery;  Laterality: Left;  DECOMPRESSIVE CRANIECTOMY with bone flap to abdomen  . IR ANGIO INTRA EXTRACRAN SEL COM CAROTID INNOMINATE UNI R MOD SED  02/19/2018  . IR ANGIO VERTEBRAL SEL VERTEBRAL BILAT MOD SED  02/19/2018  . IR CT HEAD LTD  02/19/2018  . IR PERCUTANEOUS ART THROMBECTOMY/INFUSION INTRACRANIAL INC DIAG ANGIO  02/19/2018  . IR US GUIDE VASC ACCESS RIGHT  02/19/2018  . RADIOLOGY WITH ANESTHESIA N/A 02/19/2018   Procedure: IR WITH ANESTHESIA CODE STROKE;  Surgeon: Corrie Mckusick, DO;  Location: [place];  Service: Anesthesiology;  Laterality: N/A;  . TEE WITHOUT CARDIOVERSION N/A 03/01/2018   Procedure: TRANSESOPHAGEAL ECHOCARDIOGRAM (TEE);  Surgeon: Sanda Klein, MD;  Location: [facility] ENDOSCOPY;  Service: Cardiovascular;  Laterality: N/A;    Family History  Problem Relation Age of Onset   . Stroke Maternal Grandmother   . Liver cancer Maternal Grandmother   . Stroke Maternal Grandfather   . Leukemia Mother        CLL/Dr. Leretha Pol at [facility]  . Diabetes Father   . High blood pressure Father   . Liver cancer Paternal Grandmother   . Cerebral aneurysm Paternal Grandfather    Social History:  reports that he quit smoking about 2 months ago. His smoking use included cigarettes. He has a 4.00 pack-year smoking history. He has never used smokeless tobacco. He reports that he drank about 14.0 standard drinks of alcohol per week. He reports that he does not use drugs.  Allergies: No Known Allergies  Medications Prior to Admission  Medication Sig Dispense Refill  . acetaminophen (TYLENOL) 650 MG CR tablet Take 650 mg by mouth daily as needed for pain.    Marland Kitchen aspirin 325 MG tablet Take 1 tablet (325 mg total) by mouth daily.    . baclofen (LIORESAL) 10 MG tablet Take 1 tablet (10 mg total) by mouth 4 (four) times daily. 120 each 0  . cephALEXin (KEFLEX) 500 MG capsule Take 1 capsule (500 mg total) by mouth 4 (four) times daily for 7 days. 28 capsule 0  . cyanocobalamin 1000 MCG tablet Take 1 tablet (1,000 mcg total) by mouth daily. 30 tablet 0  . folic acid (FOLVITE) 1 MG tablet Take 4 tablets (4 mg total) by mouth daily. (Patient taking differently: Take 1 mg by mouth 4 (four) times daily. ) 120 tablet 0  .  pantoprazole (PROTONIX) 40 MG tablet Take 40 mg by mouth daily.    Marland Kitchen pyridOXINE (B-6) 50 MG tablet Take 1 tablet (50 mg total) by mouth daily. 30 tablet 0  . sulfamethoxazole-trimethoprim (BACTRIM DS,SEPTRA DS) 800-160 MG tablet Take 1 tablet by mouth 2 (two) times daily. 14 tablet 0  . acetaminophen (TYLENOL) 325 MG tablet Take 1-2 tablets (325-650 mg total) by mouth every 4 (four) hours as needed for mild pain.    . Menthol-Methyl Salicylate (MUSCLE RUB) 10-15 % CREA Apply 1 application topically 2 (two) times daily. (Patient taking differently: Apply 1 application topically 2 (two)  times daily as needed for muscle pain. )  0  . senna-docusate (SENOKOT-S) 8.6-50 MG tablet Take 2 tablets by mouth at bedtime as needed for mild constipation. (Patient not taking: Reported on 04/21/2018)      No results found for this or any previous visit (from the past 48 hour(s)). No results found.  Review of Systems  Neurological: Positive for headaches. Negative for tingling.    Blood pressure 133/90, pulse 87, temperature 98.4 F (36.9 C), temperature source Oral, resp. rate 20, SpO2 98 %. Physical Exam  Constitutional: He is oriented to person, place, and time. He appears well-developed and well-nourished.  HENT:  Head: Normocephalic.  Eyes: Pupils are equal, round, and reactive to light.  Neck: Normal range of motion.  Respiratory: Effort normal.  GI: Soft. Bowel sounds are normal.  Neurological: He is alert and oriented to person, place, and time. He has normal strength. GCS eye subscore is 4. GCS verbal subscore is 5. GCS motor subscore is 6.  Patient is awake and alert right hemiparesis has a mixed dysphasia but is able to follow some simple commands.  Left-sided cranial defect intact incision looks good small area of ulceration that appears to be focal and I do not feel that this needs to preclude reimplantation of flap.  Is been on antibiotics now for a week for a decubitus.  Skin: Skin is warm and dry.     Assessment/Plan 37 year old presents for re-exposure of left-sided cranial defect and reimplantation of cranial flap.  Xavier Villegas P, MD 05/02/2018, 7:11 AM

## 2018-05-03 ENCOUNTER — Other Ambulatory Visit: Payer: Self-pay

## 2018-05-03 ENCOUNTER — Encounter (HOSPITAL_COMMUNITY): Payer: Self-pay | Admitting: Neurosurgery

## 2018-05-03 LAB — BASIC METABOLIC PANEL
Anion gap: 12 (ref 5–15)
BUN: 9 mg/dL (ref 6–20)
CALCIUM: 9.3 mg/dL (ref 8.9–10.3)
CO2: 25 mmol/L (ref 22–32)
CREATININE: 0.94 mg/dL (ref 0.61–1.24)
Chloride: 100 mmol/L (ref 98–111)
GFR calc non Af Amer: >60 mL/min (ref >60)
Glucose, Bld: 102 mg/dL — ABNORMAL HIGH (ref 70–99)
Potassium: 3.9 mmol/L (ref 3.5–5.1)
SODIUM: 137 mmol/L (ref 135–145)

## 2018-05-03 MED ORDER — INFLUENZA VAC SPLIT QUAD 0.5 ML IM SUSY
0.5000 mL | PREFILLED_SYRINGE | INTRAMUSCULAR | Status: AC
  Administered 2018-05-04: 0.5 mL via INTRAMUSCULAR
  Filled 2018-05-03: qty 0.5

## 2018-05-03 NOTE — Progress Notes (Signed)
Report received from [facility], [place]; patient arrived from 4N ICU, father and belongings at bedside. Vitals stable. Patient oriented x4, though difficult to assess d/t expressive aphasia.

## 2018-05-03 NOTE — Progress Notes (Signed)
Subjective: Patient reports Doing well denies any significant headache  Objective: Vital signs in last 24 hours: Temp:  [97.6 F (36.4 C)-98.2 F (36.8 C)] 97.6 F (36.4 C) (09/24 0400) Pulse Rate:  [63-116] 76 (09/24 0700) Resp:  [9-15] 12 (09/24 0700) BP: (127-149)/(74-102) 147/87 (09/24 0700) SpO2:  [95 %-100 %] 95 % (09/24 0700) Arterial Line BP: (132-170)/(70-100) 132/82 (09/24 0700) Weight:  [81.2 kg] 81.2 kg (09/23 1530)  Intake/Output from previous day: 09/23 0701 - 09/24 0700 In: 2869.8 [I.V.:2669.7; IV Piggyback:200.1] Out: 2315 [Urine:2175; Drains:80; Blood:60] Intake/Output this shift: No intake/output data recorded.  Awake and alert dysphasic right hemiplegia at baseline  Lab Results: Recent Labs    05/02/18 1658  WBC 16.4*  HGB 15.3  HCT 43.9  PLT 317   BMET Recent Labs    05/02/18 1658  NA 137  K 4.2  CL 100  CO2 22  GLUCOSE 117*  BUN 8  CREATININE 1.01  CALCIUM 9.4    Studies/Results: No results found.  Assessment/Plan: Postop day 1 reimplantation of cranial flap doing very well transfer to the floor  LOS: 1 day     Cleora Karnik P 05/03/2018, 7:23 AM

## 2018-05-04 MED ORDER — HYDROCODONE-ACETAMINOPHEN 5-325 MG PO TABS: 1.0000 | ORAL_TABLET | ORAL | 0 refills | Status: DC | PRN

## 2018-05-04 NOTE — Progress Notes (Signed)
Patient/family given d/c instructions including printed prescriptions. IV removed. All belongings at bedside.  Patient taken to car via wheelchair.

## 2018-05-04 NOTE — Discharge Summary (Signed)
Physician Discharge Summary  Patient ID: Xavier Villegas MRN: [id number] DOB/AGE: [birth year] 37 y.o.  Admit date: 05/02/2018 Discharge date: 05/04/2018  Admission Diagnoses: Cranial defect following ischemic cerebrovascular accident with decompressive hemicraniectomy with implantation of the cranial flap and the abdominal wall.   Discharge Diagnoses: same   Discharged Condition: good  Hospital Course: The patient was admitted on 05/02/2018 and taken to the operating room where the patient underwent cranioplasty. The patient tolerated the procedure well and was taken to the recovery room and then to the ICU in stable condition. The hospital course was routine. There were no complications. The wound remained clean dry and intact. Pt had appropriate head soreness. No complaints of new pain or new N/T/W. The patient remained afebrile with stable vital signs, and tolerated a regular diet. The patient continued to increase activities, and pain was well controlled with oral pain medications.   Consults: None  Significant Diagnostic Studies:  Results for orders placed or performed during the hospital encounter of 05/02/18  MRSA PCR Screening  Result Value Ref Range   MRSA by PCR NEGATIVE NEGATIVE  Basic metabolic panel  Result Value Ref Range   Sodium 137 135 - 145 mmol/L   Potassium 4.2 3.5 - 5.1 mmol/L   Chloride 100 98 - 111 mmol/L   CO2 22 22 - 32 mmol/L   Glucose, Bld 117 (H) 70 - 99 mg/dL   BUN 8 6 - 20 mg/dL   Creatinine, Ser 1.01 0.61 - 1.24 mg/dL   Calcium 9.4 8.9 - 10.3 mg/dL   GFR calc non Af Amer >60 >60 mL/min   GFR calc Af Amer >60 >60 mL/min   Anion gap 15 5 - 15  CBC  Result Value Ref Range   WBC 16.4 (H) 4.0 - 10.5 K/uL   RBC 4.64 4.22 - 5.81 MIL/uL   Hemoglobin 15.3 13.0 - 17.0 g/dL   HCT 43.9 39.0 - 52.0 %   MCV 94.6 78.0 - 100.0 fL   MCH 33.0 26.0 - 34.0 pg   MCHC 34.9 30.0 - 36.0 g/dL   RDW 12.9 11.5 - 15.5 %   Platelets 317 150 - 400 K/uL  Basic  metabolic panel  Result Value Ref Range   Sodium 137 135 - 145 mmol/L   Potassium 3.9 3.5 - 5.1 mmol/L   Chloride 100 98 - 111 mmol/L   CO2 25 22 - 32 mmol/L   Glucose, Bld 102 (H) 70 - 99 mg/dL   BUN 9 6 - 20 mg/dL   Creatinine, Ser 0.94 0.61 - 1.24 mg/dL   Calcium 9.3 8.9 - 10.3 mg/dL   GFR calc non Af Amer >60 >60 mL/min   GFR calc Af Amer >60 >60 mL/min   Anion gap 12 5 - 15    Dg Chest 2 View  Result Date: 04/25/2018 CLINICAL DATA:  Tachycardia, right buttock infection EXAM: CHEST - 2 VIEW COMPARISON:  02/23/2018 chest radiograph. FINDINGS: Stable cardiomediastinal silhouette with normal heart size. No pneumothorax. No pleural effusion. Lungs appear clear, with no acute consolidative airspace disease and no pulmonary edema. IMPRESSION: No active cardiopulmonary disease. Electronically Signed   By: Ilona Sorrel M.D.   On: 04/25/2018 15:30    Antibiotics:  Anti-infectives (From admission, onward)   Start     Dose/Rate Route Frequency Ordered Stop   05/02/18 2200  sulfamethoxazole-trimethoprim (BACTRIM DS,SEPTRA DS) 800-160 MG per tablet 1 tablet     1 tablet Oral 2 times daily 05/02/18 1459 05/03/18 2218  05/02/18 1600  ceFAZolin (ANCEF) IVPB 2g/100 mL premix     2 g 200 mL/hr over 30 Minutes Intravenous Every 8 hours 05/02/18 1459 05/04/18 0810   05/02/18 1500  cephALEXin (KEFLEX) capsule 500 mg  Status:  Discontinued     500 mg Oral 4 times daily 05/02/18 1459 05/02/18 1554   05/02/18 0825  bacitracin 50,000 Units in sodium chloride 0.9 % 500 mL irrigation  Status:  Discontinued       As needed 05/02/18 0825 05/02/18 0941   05/02/18 0630  ceFAZolin (ANCEF) IVPB 2g/100 mL premix     2 g 200 mL/hr over 30 Minutes Intravenous To [facility] 04/29/18 1328 05/02/18 0800   05/02/18 0600  vancomycin (VANCOCIN) IVPB 1000 mg/200 mL premix     1,000 mg 200 mL/hr over 60 Minutes Intravenous To [facility] 04/29/18 1328 05/02/18 0754      Discharge Exam: Blood  pressure (!) 132/91, pulse (!) 117, temperature 97.6 F (36.4 C), temperature source Oral, resp. rate 12, height 5\' 6"  (1.676 m), weight 81.2 kg, SpO2 99 %. Neurologic: Grossly normal dysphagia   Discharge Medications:   Allergies as of 05/04/2018   No Known Allergies     Medication List    STOP taking these medications   cephALEXin 500 MG capsule Commonly known as:  KEFLEX     TAKE these medications   acetaminophen 650 MG CR tablet Commonly known as:  TYLENOL Take 650 mg by mouth daily as needed for pain.   acetaminophen 325 MG tablet Commonly known as:  TYLENOL Take 1-2 tablets (325-650 mg total) by mouth every 4 (four) hours as needed for mild pain.   aspirin 325 MG tablet Take 1 tablet (325 mg total) by mouth daily.   baclofen 10 MG tablet Commonly known as:  LIORESAL Take 1 tablet (10 mg total) by mouth 4 (four) times daily.   cyanocobalamin 1000 MCG tablet Take 1 tablet (1,000 mcg total) by mouth daily.   folic acid 1 MG tablet Commonly known as:  FOLVITE Take 4 tablets (4 mg total) by mouth daily. What changed:    how much to take  when to take this   HYDROcodone-acetaminophen 5-325 MG tablet Commonly known as:  NORCO/VICODIN Take 1 tablet by mouth every 4 (four) hours as needed for moderate pain.   MUSCLE RUB 10-15 % Crea Apply 1 application topically 2 (two) times daily. What changed:    when to take this  reasons to take this   pantoprazole 40 MG tablet Commonly known as:  PROTONIX Take 40 mg by mouth daily.   pyridOXINE 50 MG tablet Commonly known as:  B-6 Take 1 tablet (50 mg total) by mouth daily.   senna-docusate 8.6-50 MG tablet Commonly known as:  Senokot-S Take 2 tablets by mouth at bedtime as needed for mild constipation.   sulfamethoxazole-trimethoprim 800-160 MG tablet Commonly known as:  BACTRIM DS,SEPTRA DS Take 1 tablet by mouth 2 (two) times daily.       Disposition: home   Final Dx: same  Discharge Instructions     Call MD for:  difficulty breathing, headache or visual disturbances   Complete by:  As directed    Call MD for:  hives   Complete by:  As directed    Call MD for:  persistant dizziness or light-headedness   Complete by:  As directed    Call MD for:  persistant nausea and vomiting   Complete by:  As directed  Call MD for:  redness, tenderness, or signs of infection (pain, swelling, redness, odor or green/yellow discharge around incision site)   Complete by:  As directed    Call MD for:  severe uncontrolled pain   Complete by:  As directed    Call MD for:  temperature >100.4   Complete by:  As directed    Diet - low sodium heart healthy   Complete by:  As directed    Increase activity slowly   Complete by:  As directed          Signed: Ocie Cornfield Raianna Slight 05/04/2018, 1:31 PM

## 2018-05-05 ENCOUNTER — Ambulatory Visit: Payer: BLUE CROSS/BLUE SHIELD | Admitting: Physical Therapy

## 2018-05-05 ENCOUNTER — Encounter: Payer: BLUE CROSS/BLUE SHIELD | Admitting: Speech Pathology

## 2018-05-05 ENCOUNTER — Ambulatory Visit: Payer: BLUE CROSS/BLUE SHIELD | Admitting: Speech Pathology

## 2018-05-10 ENCOUNTER — Ambulatory Visit: Payer: BLUE CROSS/BLUE SHIELD

## 2018-05-10 ENCOUNTER — Telehealth: Payer: Self-pay | Admitting: *Deleted

## 2018-05-10 ENCOUNTER — Ambulatory Visit: Payer: BLUE CROSS/BLUE SHIELD | Attending: Physical Medicine and Rehabilitation | Admitting: Speech Pathology

## 2018-05-10 ENCOUNTER — Encounter: Payer: Self-pay | Admitting: Occupational Therapy

## 2018-05-10 ENCOUNTER — Ambulatory Visit: Payer: BLUE CROSS/BLUE SHIELD | Admitting: Occupational Therapy

## 2018-05-10 ENCOUNTER — Encounter: Payer: Self-pay | Admitting: Speech Pathology

## 2018-05-10 VITALS — BP 123/77 | HR 65

## 2018-05-10 DIAGNOSIS — R262 Difficulty in walking, not elsewhere classified: Secondary | ICD-10-CM

## 2018-05-10 DIAGNOSIS — M6281 Muscle weakness (generalized): Secondary | ICD-10-CM | POA: Diagnosis not present

## 2018-05-10 DIAGNOSIS — M25511 Pain in right shoulder: Secondary | ICD-10-CM | POA: Insufficient documentation

## 2018-05-10 DIAGNOSIS — R278 Other lack of coordination: Secondary | ICD-10-CM | POA: Insufficient documentation

## 2018-05-10 DIAGNOSIS — E539 Vitamin B deficiency, unspecified: Secondary | ICD-10-CM

## 2018-05-10 DIAGNOSIS — I63511 Cerebral infarction due to unspecified occlusion or stenosis of right middle cerebral artery: Secondary | ICD-10-CM | POA: Insufficient documentation

## 2018-05-10 DIAGNOSIS — I69351 Hemiplegia and hemiparesis following cerebral infarction affecting right dominant side: Secondary | ICD-10-CM

## 2018-05-10 DIAGNOSIS — R2689 Other abnormalities of gait and mobility: Secondary | ICD-10-CM | POA: Diagnosis not present

## 2018-05-10 DIAGNOSIS — R482 Apraxia: Secondary | ICD-10-CM

## 2018-05-10 DIAGNOSIS — R4701 Aphasia: Secondary | ICD-10-CM

## 2018-05-10 MED ORDER — FOLIC ACID 1 MG PO TABS: 4.0000 mg | ORAL_TABLET | Freq: Every day | ORAL | 1 refills | Status: DC

## 2018-05-10 MED ORDER — PYRIDOXINE HCL 50 MG PO TABS: 50.0000 mg | ORAL_TABLET | Freq: Every day | ORAL | 1 refills | Status: DC

## 2018-05-10 MED ORDER — BACLOFEN 10 MG PO TABS: 10.0000 mg | ORAL_TABLET | Freq: Four times a day (QID) | ORAL | 1 refills | Status: DC

## 2018-05-10 MED ORDER — CYANOCOBALAMIN 1000 MCG PO TABS: 1000.0000 ug | ORAL_TABLET | Freq: Every day | ORAL | 1 refills | Status: DC
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
## 2018-05-10 NOTE — Therapy (Addendum)
[place] MAIN [facility] SERVICES [address] [place], [place], [address] Phone: [phone]   Fax:  [phone]  Physical Therapy Treatment  Patient Details  Name: Xavier Villegas MRN: [id number] Date of Birth: [birth year] Referring Provider (PT): Xavier Villegas   Encounter Date: 05/10/2018  PT End of Session - 05/10/18 1200    Visit Number  5    Number of Visits  25    Date for PT Re-Evaluation  07/14/18    PT Start Time  1116    PT Stop Time  1157    PT Time Calculation (min)  41 min    Equipment Utilized During Treatment  Gait belt    Activity Tolerance  Patient tolerated treatment well    Behavior During Therapy  [facility] for tasks assessed/performed       Past Medical History:  Diagnosis Date  . Aphasia   . GERD (gastroesophageal reflux disease)   . Hemiparesis ([place]) 02/2018   right side  . Hyperhomocysteinemia ([place])   . Neurogenic bladder 02/2018   04/26/18 inporving  . Spastic hemiparesis (HCC)    right side  . Stroke ([place])    Left MCA infart  . Tobacco abuse     Past Surgical History:  Procedure Laterality Date  . CRANIOTOMY Left 02/21/2018   Procedure: DECOMPRESSIVE CRANIECTOMY with bone flap to abdomen;  Surgeon: [name], MD;  Location: [place];  Service: Neurosurgery;  Laterality: Left;  DECOMPRESSIVE CRANIECTOMY with bone flap to abdomen  . CRANIOTOMY N/A 05/02/2018   Procedure: RE-IMPLANTATION OF CRANIAL FLAP;  Surgeon: [name], MD;  Location: [place];  Service: Neurosurgery;  Laterality: N/A;  RE-IMPLANTATION OF CRANIAL FLAP  . IR ANGIO INTRA EXTRACRAN SEL COM CAROTID INNOMINATE UNI R MOD SED  02/19/2018  . IR ANGIO VERTEBRAL SEL VERTEBRAL BILAT MOD SED  02/19/2018  . IR CT HEAD LTD  02/19/2018  . IR PERCUTANEOUS ART THROMBECTOMY/INFUSION INTRACRANIAL INC DIAG ANGIO  02/19/2018  . IR US GUIDE VASC ACCESS RIGHT  02/19/2018  . RADIOLOGY WITH ANESTHESIA N/A 02/19/2018   Procedure: IR WITH ANESTHESIA CODE STROKE;  Surgeon:  Corrie Mckusick, DO;  Location: [place];  Service: Anesthesiology;  Laterality: N/A;  . TEE WITHOUT CARDIOVERSION N/A 03/01/2018   Procedure: TRANSESOPHAGEAL ECHOCARDIOGRAM (TEE);  Surgeon: Sanda Klein, MD;  Location: [place] [facility];  Service: Cardiovascular;  Laterality: N/A;    Vitals:   05/10/18 1158  BP: 123/77  Pulse: 65    Subjective Assessment - 05/10/18 1159    Subjective  Patient reports he is doing well today. Presents to therapy with his dad. Is unsure if he still has to wear helmet after surgery.  Expresses desire to work on going up and down steps.     Patient is accompained by:  Family member    Pertinent History  [name]  had left MCA infarct and subsequent craniectomy.  He was discharged from rehab several weeks ago. He has been home with his family  He is having occasional spasms on the right side still.Patient has Right hemiparesis, dysphagia, and aphasia secondary to large left MCA infarct with hemorrhagic transformation s/p hemicraniectomy.He had acute ischemic left MCA infarct and subsequent craniotomy - was first admitted on 03/01/18.  He was discharged from hospital on 04/06/18    How long can you stand comfortably?  stand for 20 mins    How long can you walk comfortably?  10 mins    Patient Stated Goals  to walk without  AD and live on his own.    Currently in Pain?  No/denies    Pain Onset  More than a month ago      Vitals: 123/77 HR 65bpm  Reassessed goals:   5xsts- 14seconds. 1 UE CGA. Decreased weight shift on RLE TUG- 21sec; with quad cane CGA 63mt- 14seconds 0.7543m with quad cane CGA BERG- 36/56 43m50m 555f25fth quad cane CGA. No rest breaks; decreased stance time on R leg, hyperextension of R LE, decreased heel strike  Ambulating 3 steps ascend/descend with L UE support. Step to gait pattern with strong reliance with UE. Verbal cues to descend with involved LE. Caught R foot on step when ascending due to motor control pattern of hip flexion.      [facility] HospitalAssessment - 05/10/18 0001      Standardized Balance Assessment   Standardized Balance Assessment  Berg Balance Test      Berg Balance Test   Sit to Stand  Able to stand  independently using hands    Standing Unsupported  Able to stand safely 2 minutes    Sitting with Back Unsupported but Feet Supported on Floor or Stool  Able to sit safely and securely 2 minutes    Stand to Sit  Controls descent by using hands    Transfers  Able to transfer safely, definite need of hands    Standing Unsupported with Eyes Closed  Able to stand 10 seconds with supervision    Standing Ubsupported with Feet Together  Able to place feet together independently and stand for 1 minute with supervision    From Standing, Reach Forward with Outstretched Arm  Can reach forward >12 cm safely (5")    From Standing Position, Pick up Object from [place] up shoe, needs supervision    From Standing Position, Turn to Look Behind Over each Shoulder  Looks behind one side only/other side shows less weight shift    Turn 360 Degrees  Needs close supervision or verbal cueing    Standing Unsupported, Alternately Place Feet on Step/Stool  Needs assistance to keep from falling or unable to try    Standing Unsupported, One Foot in [place] small step independently and hold 30 seconds    Standing on One Leg  Tries to lift leg/unable to hold 3 seconds but remains standing independently    Total Score  36                           PT Education - 05/10/18 1200    Education Details  weight shift, exercise form/technique, goals    Person(s) Educated  Patient;Parent(s)    Methods  Explanation;Demonstration;Verbal cues    Comprehension  Verbalized understanding;Returned demonstration       PT Short Term Goals - 05/10/18 1201      PT SHORT TERM GOAL #1   Title  Patient will be independent in home exercise program to improve strength/mobility for better functional independence  with ADLs.    Baseline  05/10/18: HEP compliance    Time  6    Period  Weeks    Status  Achieved      PT SHORT TERM GOAL #2   Title  Patient (< 60 y41rs old) will complete five times sit to stand test in < 10 seconds indicating an increased LE strength and improved balance.    Baseline  15.67 sec 05/10/18: 14seconds  Time  6    Period  Weeks    Status  Partially Met      PT SHORT TERM GOAL #3   Title  Patient will reduce timed up and go to <11 seconds to reduce fall risk and demonstrate improved transfer/gait ability.    Baseline  18.88 sec 05/10/18: 21seconds    Time  6    Period  Weeks    Status  On-going        PT Long Term Goals - 05/10/18 1202      PT LONG TERM GOAL #1   Title  Patient will increase six minute walk test distance to >1000 for progression to community ambulator and improve gait ability    Baseline  585 feet 05/10/18: 558f    Time  12    Period  Weeks    Status  On-going      PT LONG TERM GOAL #2   Title  Patient will increase 10 meter walk test to >1.06m as to improve gait speed for better community ambulation and to reduce fall risk.    Baseline  .48 m/sec 05/10/18: 0.7127m   Time  12    Period  Weeks    Status  New      PT LONG TERM GOAL #3   Title  Patient will increase Berg Balance score by > 6 points to demonstrate decreased fall risk during functional activities.    Baseline  25/56 05/10/18: 36/56    Time  12    Period  Weeks    Status  Partially Met      PT LONG TERM GOAL #4   Title  Patient will be require no assist with ascend/descend 3 steps using Least restrictive assistive device.    Baseline  needs rail and cues for sequencing and min assist for RLE ascending the step 05/10/18: needs rail and cues for sequencing; heavily relies on UE    Time  12    Period  Weeks    Status  Partially Met            Plan - 05/10/18 1214    Clinical Impression Statement  Goals were updated secondary to patient receiving surgery to replace bone  flap on 05/02/18. Patient increased 10 meter walk test and BERG balance score demonstrating improved gait speed and balance. Patient is still progressing towards goals at this time. Patient will continue to benefit from skilled physical therapy to improve strength, functional mobility, balance, gait, and improve quality of life.     Rehab Potential  Good    PT Frequency  2x / week    PT Duration  12 weeks    PT Treatment/Interventions  Manual techniques;Patient/family education;Neuromuscular re-education;Balance training;Functional mobility training;Therapeutic activities;Therapeutic exercise;Gait training;Orthotic Fit/Training;Passive range of motion;ADLs/Self Care Home Management;Electrical Stimulation;Stair training;Energy conservation;Taping;DME Instruction    PT Next Visit Plan  strengthening, balance, gait training    PT Home Exercise Plan  standing abd bilaterally x 15    Consulted and Agree with Plan of Care  Patient;Family member/caregiver       Patient will benefit from skilled therapeutic intervention in order to improve the following deficits and impairments:  Abnormal gait, Decreased balance, Decreased endurance, Decreased mobility, Difficulty walking, Impaired sensation, Increased muscle spasms, Impaired UE functional use, Impaired flexibility, Decreased strength, Decreased coordination, Decreased activity tolerance  Visit Diagnosis: Muscle weakness (generalized)  Difficulty in walking, not elsewhere classified  Other abnormalities of gait and mobility     Problem  List Patient Active Problem List   Diagnosis Date Noted  . CVA (cerebral vascular accident) ([place]) 05/02/2018  . Neurogenic bowel   . Neurogenic bladder   . Spastic hemiparesis ([place])   . 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 Cerebral infarction due to embolism of left middle cerebral artery (HCC) s/p thrombectomy 03/01/2018  . Carotid artery dissection  ([place]) Left 03/01/2018  . Acute ischemic right MCA stroke ([place]) 03/01/2018  . Right hemiparesis ([place])   . Dysphagia, post-stroke   . Global aphasia   . Leukocytosis   . Folic acid deficiency 74/03/1447  . B12 deficiency 02/23/2018  . Hyperhomocysteinemia ([place]) 02/23/2018  . Smoker    Erick Blinks, SPT This entire session was performed under direct supervision and direction of a licensed therapist/therapist assistant . I have personally read, edited and approve of the note as written.  Janna Arch, PT, DPT   05/10/2018, 12:55 PM  [place] MAIN [facility] SERVICES [address] [place], [place], [address] Phone: [phone]   Fax:  [phone]  Name: Xavier Villegas MRN: [id number] Date of Birth: 10/10/1980

## 2018-05-10 NOTE — Telephone Encounter (Signed)
Pharmacy never received medications ordered by Dr Naaman Plummer. He is requesting they be sent in again. I have reordered. He returns 06/17/18 so add. RF given.

## 2018-05-10 NOTE — Therapy (Signed)
[place] MAIN [facility] SERVICES [address] [place], [place], [address] Phone: [phone]   Fax:  [phone]  Speech Language Pathology Treatment  Patient Details  Name: Xavier Villegas MRN: [id number] Date of Birth: 02/04/1981 Referring Provider (SLP): Bary Leriche    Encounter Date: 05/10/2018  End of Session - 05/10/18 1240    Visit Number  5    Number of Visits  25    Date for SLP Re-Evaluation  07/13/18    SLP Start Time  0900    SLP Stop Time   0953    SLP Time Calculation (min)  53 min    Activity Tolerance  Patient tolerated treatment well       Past Medical History:  Diagnosis Date  . Aphasia   . GERD (gastroesophageal reflux disease)   . Hemiparesis ([place]) 02/2018   right side  . Hyperhomocysteinemia ([place])   . Neurogenic bladder 02/2018   04/26/18 inporving  . Spastic hemiparesis (HCC)    right side  . Stroke ([place])    Left MCA infart  . Tobacco abuse     Past Surgical History:  Procedure Laterality Date  . CRANIOTOMY Left 02/21/2018   Procedure: DECOMPRESSIVE CRANIECTOMY with bone flap to abdomen;  Surgeon: [name], MD;  Location: [place];  Service: Neurosurgery;  Laterality: Left;  DECOMPRESSIVE CRANIECTOMY with bone flap to abdomen  . CRANIOTOMY N/A 05/02/2018   Procedure: RE-IMPLANTATION OF CRANIAL FLAP;  Surgeon: [name], MD;  Location: [place];  Service: Neurosurgery;  Laterality: N/A;  RE-IMPLANTATION OF CRANIAL FLAP  . IR ANGIO INTRA EXTRACRAN SEL COM CAROTID INNOMINATE UNI R MOD SED  02/19/2018  . IR ANGIO VERTEBRAL SEL VERTEBRAL BILAT MOD SED  02/19/2018  . IR CT HEAD LTD  02/19/2018  . IR PERCUTANEOUS ART THROMBECTOMY/INFUSION INTRACRANIAL INC DIAG ANGIO  02/19/2018  . IR US GUIDE VASC ACCESS RIGHT  02/19/2018  . RADIOLOGY WITH ANESTHESIA N/A 02/19/2018   Procedure: IR WITH ANESTHESIA CODE STROKE;  Surgeon: Corrie Mckusick, DO;  Location: [place];  Service: Anesthesiology;  Laterality: N/A;  . TEE WITHOUT  CARDIOVERSION N/A 03/01/2018   Procedure: TRANSESOPHAGEAL ECHOCARDIOGRAM (TEE);  Surgeon: Sanda Klein, MD;  Location: [facility];  Service: Cardiovascular;  Laterality: N/A;    There were no vitals filed for this visit.  Subjective Assessment - 05/10/18 1240    Subjective  "right"            ADULT SLP TREATMENT - 05/10/18 0001      General Information   Behavior/Cognition  Alert;Cooperative;Pleasant mood    HPI   37 year old man with left MCA CVA 02/19/2018.  Patient received inpatient rehab services, including SLP, for 5 weeks.       Treatment Provided   Treatment provided  Cognitive-Linquistic      Pain Assessment   Pain Assessment  No/denies pain      Cognitive-Linquistic Treatment   Treatment focused on  Aphasia;Apraxia    Skilled Treatment  SPEECH: Patient able to consistently hum in unison and to command.  Hum into "me" with 70% accuracy and hum into "moo" sporadically.  Patient was not able to hum into other vowels this session.  Imitate 6 words, is not able to repeat X3 while maintaining phonemic accuracy.  Count to 10, in unison, with 60% accuracy.  COMPREHENSION:  Answer 3-unit yes no questions with 95% accuracy.  Follow 2-unit body part commands with 70% accuracy.  Identify stated picture (f=12)  with 85% accuracy.    Match word to picture with 80% accuracy.  Follow 2-unit commands with 50% accuracy and 90% given repetition.  ID 2 pictures stated serially (array of 10) with 20% accuracy- patient has never been successful with this task before.      Assessment / Recommendations / Plan   Plan  Continue with current plan of care      Progression Toward Goals   Progression toward goals  Progressing toward goals       SLP Education - 05/10/18 1240    Education Details  multi-modal communication    Person(s) Educated  Patient    Methods  Explanation    Comprehension  Verbalized understanding         SLP Long Term Goals - 04/14/18 1220      SLP LONG TERM  GOAL #1   Title  Patient will name objects using multi-modal strategies with 80% accuracy.    Time  12    Period  Weeks    Status  New    Target Date  07/13/18      SLP LONG TERM GOAL #2   Title  Patient will read aloud or repeat words, maintaining phonemic accuracy, with 80% accuracy.      Time  12    Period  Weeks    Status  New    Target Date  07/13/18      SLP LONG TERM GOAL #3   Title  Patient will complete 3 unit processing tasks with 80% accuracy without the need of repetition of task instructions or significant delays in responding.    Time  12    Period  Weeks    Status  New      SLP LONG TERM GOAL #4   Title  Patient will demonstrate reading comprehension for words and phrases with 80% accuracy.     Time  12    Period  Weeks    Status  New    Target Date  07/13/18       Plan - 05/10/18 1241    Clinical Impression Statement  Patient is alert and eager to work on his communication.  He demonstrates good comprehension for moderately complex yes/no questions but has more difficulty with more complex verbal information.  Patient demonstrates difficulty with body part identification.      Speech Therapy Frequency  2x / week    Duration  Other (comment)    Treatment/Interventions  Language facilitation;Multimodal communcation approach;Patient/family education;SLP instruction and feedback    Potential to Achieve Goals  Good    Potential Considerations  Ability to learn/carryover information;Pain level;Family/community support;Co-morbidities;Previous level of function;Cooperation/participation level;Severity of impairments;Medical prognosis    Consulted and Agree with Plan of Care  Patient       Patient will benefit from skilled therapeutic intervention in order to improve the following deficits and impairments:   Aphasia  Apraxia    Problem List Patient Active Problem List   Diagnosis Date Noted  . CVA (cerebral vascular accident) ([place]) 05/02/2018  . Neurogenic  bowel   . Neurogenic bladder   . Spastic hemiparesis ([place])   . 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 Cerebral infarction due to embolism of left middle cerebral artery (HCC) s/p thrombectomy 03/01/2018  . Carotid artery dissection ([place]) Left 03/01/2018  . Acute ischemic right MCA stroke ([place]) 03/01/2018  . Right hemiparesis ([place])   . Dysphagia, post-stroke   .  Global aphasia   . Leukocytosis   . Folic acid deficiency 26/83/4196  . B12 deficiency 02/23/2018  . Hyperhomocysteinemia ([place]) 02/23/2018  . Smoker    Leroy Sea, MS/CCC- SLP  Lou Miner 05/10/2018, 12:41 PM  [place] MAIN [facility] SERVICES [address] [place], [place], [address] Phone: [phone]   Fax:  [phone]   Name: Xavier Villegas MRN: [id number] Date of Birth: 20-Oct-1980

## 2018-05-12 ENCOUNTER — Ambulatory Visit: Payer: BLUE CROSS/BLUE SHIELD

## 2018-05-12 ENCOUNTER — Encounter: Payer: Self-pay | Admitting: Speech Pathology

## 2018-05-12 ENCOUNTER — Ambulatory Visit: Payer: BLUE CROSS/BLUE SHIELD | Admitting: Speech Pathology

## 2018-05-12 ENCOUNTER — Ambulatory Visit: Payer: BLUE CROSS/BLUE SHIELD | Admitting: Occupational Therapy

## 2018-05-12 ENCOUNTER — Encounter: Payer: Self-pay | Admitting: Occupational Therapy

## 2018-05-12 DIAGNOSIS — R482 Apraxia: Secondary | ICD-10-CM

## 2018-05-12 DIAGNOSIS — M25511 Pain in right shoulder: Secondary | ICD-10-CM

## 2018-05-12 DIAGNOSIS — I69351 Hemiplegia and hemiparesis following cerebral infarction affecting right dominant side: Secondary | ICD-10-CM

## 2018-05-12 DIAGNOSIS — M6281 Muscle weakness (generalized): Secondary | ICD-10-CM

## 2018-05-12 DIAGNOSIS — R4701 Aphasia: Secondary | ICD-10-CM | POA: Diagnosis not present

## 2018-05-12 DIAGNOSIS — R262 Difficulty in walking, not elsewhere classified: Secondary | ICD-10-CM | POA: Diagnosis not present

## 2018-05-12 DIAGNOSIS — R278 Other lack of coordination: Secondary | ICD-10-CM | POA: Diagnosis not present

## 2018-05-12 DIAGNOSIS — I63511 Cerebral infarction due to unspecified occlusion or stenosis of right middle cerebral artery: Secondary | ICD-10-CM | POA: Diagnosis not present

## 2018-05-12 DIAGNOSIS — R2689 Other abnormalities of gait and mobility: Secondary | ICD-10-CM | POA: Diagnosis not present
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
## 2018-05-12 NOTE — Therapy (Addendum)
[place] MAIN [facility] SERVICES [address] [place], [place], [address] Phone: [phone]   Fax:  [phone]  Physical Therapy Treatment  Patient Details  Name: Xavier Villegas MRN: [id number] Date of Birth: [birth year] Referring Provider (PT): Reesa Chew   Encounter Date: 05/12/2018  PT End of Session - 05/12/18 1217    Visit Number  6    Number of Visits  25    Date for PT Re-Evaluation  07/14/18    PT Start Time  1116    PT Stop Time  1200    PT Time Calculation (min)  44 min    Equipment Utilized During Treatment  Gait belt    Activity Tolerance  Patient tolerated treatment well    Behavior During Therapy  [facility] for tasks assessed/performed       Past Medical History:  Diagnosis Date  . Aphasia   . GERD (gastroesophageal reflux disease)   . Hemiparesis ([place]) 02/2018   right side  . Hyperhomocysteinemia ([place])   . Neurogenic bladder 02/2018   04/26/18 inporving  . Spastic hemiparesis (HCC)    right side  . Stroke ([place])    Left MCA infart  . Tobacco abuse     Past Surgical History:  Procedure Laterality Date  . CRANIOTOMY Left 02/21/2018   Procedure: DECOMPRESSIVE CRANIECTOMY with bone flap to abdomen;  Surgeon: [name], MD;  Location: [place];  Service: Neurosurgery;  Laterality: Left;  DECOMPRESSIVE CRANIECTOMY with bone flap to abdomen  . CRANIOTOMY N/A 05/02/2018   Procedure: RE-IMPLANTATION OF CRANIAL FLAP;  Surgeon: [name], MD;  Location: [place];  Service: Neurosurgery;  Laterality: N/A;  RE-IMPLANTATION OF CRANIAL FLAP  . IR ANGIO INTRA EXTRACRAN SEL COM CAROTID INNOMINATE UNI R MOD SED  02/19/2018  . IR ANGIO VERTEBRAL SEL VERTEBRAL BILAT MOD SED  02/19/2018  . IR CT HEAD LTD  02/19/2018  . IR PERCUTANEOUS ART THROMBECTOMY/INFUSION INTRACRANIAL INC DIAG ANGIO  02/19/2018  . IR US GUIDE VASC ACCESS RIGHT  02/19/2018  . RADIOLOGY WITH ANESTHESIA N/A 02/19/2018   Procedure: IR WITH ANESTHESIA CODE STROKE;  Surgeon:  Corrie Mckusick, DO;  Location: [place];  Service: Anesthesiology;  Laterality: N/A;  . TEE WITHOUT CARDIOVERSION N/A 03/01/2018   Procedure: TRANSESOPHAGEAL ECHOCARDIOGRAM (TEE);  Surgeon: Sanda Klein, MD;  Location: [facility] ENDOSCOPY;  Service: Cardiovascular;  Laterality: N/A;    There were no vitals filed for this visit.  Subjective Assessment - 05/12/18 1208    Subjective  Patient reports he is doing well. He went to doctor and he still has to wear helmet at this time.     Pertinent History  Xavier Villegas  had left MCA infarct and subsequent craniectomy.  He was discharged from rehab several weeks ago. He has been home with his family  He is having occasional spasms on the right side still.Patient has Right hemiparesis, dysphagia, and aphasia secondary to large left MCA infarct with hemorrhagic transformation s/p hemicraniectomy.He had acute ischemic left MCA infarct and subsequent craniotomy - was first admitted on 03/01/18.  He was discharged from hospital on 04/06/18    How long can you stand comfortably?  stand for 20 mins    How long can you walk comfortably?  10 mins    Patient Stated Goals  to walk without AD and live on his own.    Currently in Pain?  No/denies    Pain Onset  More than a month ago  Seated: -IT band stretch 30 sec holds x2 reps; PT performed stretch with pt's ankle on PT's knee  -seated marches with visual and tactile cues for step height and proper alignment. 2x10   Seated with R foot on bolster x15 knee flexion and extension. Facilitation at lateral knee for alignment. Completed with minA for knee flexion and extension but assist was faded with duration of task  Squats with green pad under LLE to increase RLE weight bearing 2x10 reps without UE support, VCs to push through RLE and maintain forward trunk lean. Tactile cues to prevent hyperextension of RLE. x2 person assist (one to guard, one for knee)   Standing toe taps with 4in step. Verbal cues to increasing  weight shift and step height. Progressed to 6in step when stepping with LLE. Used airex pad   Standing marches with RLE. Visual cues for step height and alignment. Tactile cues to further facilitate alignment and prevention of circumduction.  step over half bolster with RLE and LLE with LUE support x10 reps,VCs for increasing R hip and knee flexion to clear the bolster   Balloon passes x3 min, CGA for safety without UE support, reaching outside and inside BOS as well as crossing midline with LUE; demonstrated quick reflexes and response to balloon   STM with rolling stick of gastrocnemius due to onset of fasciculations.                         PT Education - 05/12/18 1209    Education Details  weight shift, exercise form/technique    Person(s) Educated  Patient    Methods  Explanation;Demonstration;Verbal cues    Comprehension  Verbalized understanding;Returned demonstration       PT Short Term Goals - 05/10/18 1201      PT SHORT TERM GOAL #1   Title  Patient will be independent in home exercise program to improve strength/mobility for better functional independence with ADLs.    Baseline  05/10/18: HEP compliance    Time  6    Period  Weeks    Status  Achieved      PT SHORT TERM GOAL #2   Title  Patient (< 71 years old) will complete five times sit to stand test in < 10 seconds indicating an increased LE strength and improved balance.    Baseline  15.67 sec 05/10/18: 14seconds    Time  6    Period  Weeks    Status  Partially Met      PT SHORT TERM GOAL #3   Title  Patient will reduce timed up and go to <11 seconds to reduce fall risk and demonstrate improved transfer/gait ability.    Baseline  18.88 sec 05/10/18: 21seconds    Time  6    Period  Weeks    Status  On-going        PT Long Term Goals - 05/10/18 1202      PT LONG TERM GOAL #1   Title  Patient will increase six minute walk test distance to >1000 for progression to community ambulator and  improve gait ability    Baseline  585 feet 05/10/18: 533f    Time  12    Period  Weeks    Status  On-going      PT LONG TERM GOAL #2   Title  Patient will increase 10 meter walk test to >1.075m as to improve gait speed for better community ambulation and to reduce  fall risk.    Baseline  .48 m/sec 05/10/18: 0.36ms    Time  12    Period  Weeks    Status  New      PT LONG TERM GOAL #3   Title  Patient will increase Berg Balance score by > 6 points to demonstrate decreased fall risk during functional activities.    Baseline  25/56 05/10/18: 36/56    Time  12    Period  Weeks    Status  Partially Met      PT LONG TERM GOAL #4   Title  Patient will be require no assist with ascend/descend 3 steps using Least restrictive assistive device.    Baseline  needs rail and cues for sequencing and min assist for RLE ascending the step 05/10/18: needs rail and cues for sequencing; heavily relies on UE    Time  12    Period  Weeks    Status  Partially Met            Plan - 05/12/18 1216    Clinical Impression Statement  Patient tolerated session well with continued focus on LE strengthening and hip flexion activation. Patient required minA for alignment and prevention of knee hyperextension when in weight bearing positions. Patient had improved hip flexion activation with repetition of step overs with RLE demonstrating improved motor planning however has increased trunk lean to L with activity. Patient experienced gastrocnemius fasciculations at end of session likely due to muscle fatigue. Patient will continue to benefit from skilled physical therapy to improve strength, functional mobility, balance, gait, and quality of life.     Rehab Potential  Good    PT Frequency  2x / week    PT Duration  12 weeks    PT Treatment/Interventions  Manual techniques;Patient/family education;Neuromuscular re-education;Balance training;Functional mobility training;Therapeutic activities;Therapeutic  exercise;Gait training;Orthotic Fit/Training;Passive range of motion;ADLs/Self Care Home Management;Electrical Stimulation;Stair training;Energy conservation;Taping;DME Instruction    PT Next Visit Plan  strengthening, balance, gait training    PT Home Exercise Plan  standing abd bilaterally x 15    Consulted and Agree with Plan of Care  Patient;Family member/caregiver       Patient will benefit from skilled therapeutic intervention in order to improve the following deficits and impairments:  Abnormal gait, Decreased balance, Decreased endurance, Decreased mobility, Difficulty walking, Impaired sensation, Increased muscle spasms, Impaired UE functional use, Impaired flexibility, Decreased strength, Decreased coordination, Decreased activity tolerance  Visit Diagnosis: Muscle weakness (generalized)  Difficulty in walking, not elsewhere classified  Other abnormalities of gait and mobility  Other lack of coordination     Problem List Patient Active Problem List   Diagnosis Date Noted  . CVA (cerebral vascular accident) ([place] 05/02/2018  . Neurogenic bowel   . Neurogenic bladder   . Spastic hemiparesis ([place]   . 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 Cerebral infarction due to embolism of left middle cerebral artery (HCC) s/p thrombectomy 03/01/2018  . Carotid artery dissection ([place] Left 03/01/2018  . Acute ischemic right MCA stroke ([place] 03/01/2018  . Right hemiparesis ([place]   . Dysphagia, post-stroke   . Global aphasia   . Leukocytosis   . Folic acid deficiency 062/22/9798 . B12 deficiency 02/23/2018  . Hyperhomocysteinemia ([place] 02/23/2018  . Smoker    AErick Blinks SPT  This entire session was performed under direct supervision and direction of a licensed therapist/therapist assistant . I have personally read, edited and approve of  the note as written.  Janna Arch, PT, DPT   05/12/2018, 12:32 PM  [place] MAIN [facility] SERVICES [address] [place], [place], [address] Phone: [phone]   Fax:  [phone]  Name: Xavier Villegas MRN: [id number] Date of Birth: [birth year]

## 2018-05-12 NOTE — Therapy (Signed)
[place] MAIN [facility] SERVICES [address] [place], [place], [address] Phone: [phone]   Fax:  [phone]  Speech Language Pathology Treatment  Patient Details  Name: Xavier Villegas MRN: [id number] Date of Birth: Apr 14, 1981 Referring Provider (SLP): Bary Leriche    Encounter Date: 05/12/2018  End of Session - 05/12/18 1014    Visit Number  6    Number of Visits  25    Date for SLP Re-Evaluation  07/13/18    SLP Start Time  0900    SLP Stop Time   0950    SLP Time Calculation (min)  50 min    Activity Tolerance  Patient tolerated treatment well       Past Medical History:  Diagnosis Date  . Aphasia   . GERD (gastroesophageal reflux disease)   . Hemiparesis ([place]) 02/2018   right side  . Hyperhomocysteinemia ([place])   . Neurogenic bladder 02/2018   04/26/18 inporving  . Spastic hemiparesis (HCC)    right side  . Stroke ([place])    Left MCA infart  . Tobacco abuse     Past Surgical History:  Procedure Laterality Date  . CRANIOTOMY Left 02/21/2018   Procedure: DECOMPRESSIVE CRANIECTOMY with bone flap to abdomen;  Surgeon: [name], MD;  Location: [place];  Service: Neurosurgery;  Laterality: Left;  DECOMPRESSIVE CRANIECTOMY with bone flap to abdomen  . CRANIOTOMY N/A 05/02/2018   Procedure: RE-IMPLANTATION OF CRANIAL FLAP;  Surgeon: [name], MD;  Location: [facility];  Service: Neurosurgery;  Laterality: N/A;  RE-IMPLANTATION OF CRANIAL FLAP  . IR ANGIO INTRA EXTRACRAN SEL COM CAROTID INNOMINATE UNI R MOD SED  02/19/2018  . IR ANGIO VERTEBRAL SEL VERTEBRAL BILAT MOD SED  02/19/2018  . IR CT HEAD LTD  02/19/2018  . IR PERCUTANEOUS ART THROMBECTOMY/INFUSION INTRACRANIAL INC DIAG ANGIO  02/19/2018  . IR US GUIDE VASC ACCESS RIGHT  02/19/2018  . RADIOLOGY WITH ANESTHESIA N/A 02/19/2018   Procedure: IR WITH ANESTHESIA CODE STROKE;  Surgeon: Corrie Mckusick, DO;  Location: [place];  Service: Anesthesiology;  Laterality: N/A;  . TEE WITHOUT  CARDIOVERSION N/A 03/01/2018   Procedure: TRANSESOPHAGEAL ECHOCARDIOGRAM (TEE);  Surgeon: Sanda Klein, MD;  Location: [facility] END[facility]COPY;  Service: Cardiovascular;  Laterality: N/A;    There were no vitals filed for this visit.  Subjective Assessment - 05/12/18 1014    Subjective  "yeah"            ADULT SLP TREATMENT - 05/12/18 0001      General Information   Behavior/Cognition  Alert;Cooperative;Pleasant mood    HPI   37 year old man with left MCA CVA 02/19/2018.  Patient received inpatient rehab services, including SLP, for 5 weeks.       Treatment Provided   Treatment provided  Cognitive-Linquistic      Pain Assessment   Pain Assessment  No/denies pain      Cognitive-Linquistic Treatment   Treatment focused on  Aphasia;Apraxia    Skilled Treatment  SPEECH: Patient able to consistently hum in unison and to command.  Hum into "me" with 70% accuracy and hum into "moo" sporadically.  Patient was not able to hum into other vowels this session.  Imitate 6 words, is not able to repeat X3 while maintaining phonemic accuracy.  Count to 10, in unison, with 60% accuracy.  COMPREHENSION:  Answer multi-unit yes no questions with 65% accuracy, improves to 90% given cues (emphasis, gesture, repetition).  Follow 2-unit body part commands  with 70% accuracy.  Identify stated picture (novel, f=15) with 67% accuracy.    Match word to picture with 80% accuracy.  Follow 2-unit commands with 55% accuracy and 95% given repetition.  ID 2 pictures stated serially (array of 10) with 35% accuracy. WRITING: Patient able to copy his name X1.      Assessment / Recommendations / Plan   Plan  Continue with current plan of care      Progression Toward Goals   Progression toward goals  Progressing toward goals       SLP Education - 05/12/18 1014    Education Details  multi-modal communication    Person(s) Educated  Patient    Methods  Explanation    Comprehension  Verbalized understanding         SLP  Long Term Goals - 04/14/18 1220      SLP LONG TERM GOAL #1   Title  Patient will name objects using multi-modal strategies with 80% accuracy.    Time  12    Period  Weeks    Status  New    Target Date  07/13/18      SLP LONG TERM GOAL #2   Title  Patient will read aloud or repeat words, maintaining phonemic accuracy, with 80% accuracy.      Time  12    Period  Weeks    Status  New    Target Date  07/13/18      SLP LONG TERM GOAL #3   Title  Patient will complete 3 unit processing tasks with 80% accuracy without the need of repetition of task instructions or significant delays in responding.    Time  12    Period  Weeks    Status  New      SLP LONG TERM GOAL #4   Title  Patient will demonstrate reading comprehension for words and phrases with 80% accuracy.     Time  12    Period  Weeks    Status  New    Target Date  07/13/18       Plan - 05/12/18 1015    Clinical Impression Statement  Patient is alert and eager to work on his communication.  He demonstrates improving auditory comprehension lengthier and more complex verbal information.      Speech Therapy Frequency  2x / week    Duration  Other (comment)    Treatment/Interventions  Language facilitation;Multimodal communcation approach;Patient/family education;SLP instruction and feedback    Potential to Achieve Goals  Good    Potential Considerations  Ability to learn/carryover information;Pain level;Family/community support;Co-morbidities;Previous level of function;Cooperation/participation level;Severity of impairments;Medical prognosis    Consulted and Agree with Plan of Care  Patient       Patient will benefit from skilled therapeutic intervention in order to improve the following deficits and impairments:   Aphasia  Apraxia    Problem List Patient Active Problem List   Diagnosis Date Noted  . CVA (cerebral vascular accident) ([place]) 05/02/2018  . Neurogenic bowel   . Neurogenic bladder   . Spastic hemiparesis  ([place])   . 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 Cerebral infarction due to embolism of left middle cerebral artery (HCC) s/p thrombectomy 03/01/2018  . Carotid artery dissection ([place]) Left 03/01/2018  . Acute ischemic right MCA stroke ([place]) 03/01/2018  . Right hemiparesis ([place])   . Dysphagia, post-stroke   . Global aphasia   . Leukocytosis   .  Folic acid deficiency 56/86/1683  . B12 deficiency 02/23/2018  . Hyperhomocysteinemia ([place]) 02/23/2018  . Smoker    Leroy Sea, MS/CCC- SLP  Lou Miner 05/12/2018, 10:16 AM  [place] MAIN [facility] SERVICES [address] [place], [place], [address] Phone: [phone]   Fax:  [phone]   Name: Niranjan Rufener MRN: [id number] Date of Birth: 30-Aug-1980

## 2018-05-17 ENCOUNTER — Ambulatory Visit: Payer: BLUE CROSS/BLUE SHIELD | Admitting: Occupational Therapy

## 2018-05-17 ENCOUNTER — Encounter: Payer: Self-pay | Admitting: Speech Pathology

## 2018-05-17 ENCOUNTER — Ambulatory Visit: Payer: BLUE CROSS/BLUE SHIELD

## 2018-05-17 ENCOUNTER — Ambulatory Visit: Payer: BLUE CROSS/BLUE SHIELD | Admitting: Adult Health

## 2018-05-17 ENCOUNTER — Encounter: Payer: Self-pay | Admitting: Occupational Therapy

## 2018-05-17 ENCOUNTER — Encounter: Payer: Self-pay | Admitting: Adult Health

## 2018-05-17 ENCOUNTER — Ambulatory Visit: Payer: BLUE CROSS/BLUE SHIELD | Admitting: Speech Pathology

## 2018-05-17 VITALS — BP 125/80 | HR 91 | Ht 66.0 in | Wt 181.0 lb

## 2018-05-17 DIAGNOSIS — R278 Other lack of coordination: Secondary | ICD-10-CM | POA: Diagnosis not present

## 2018-05-17 DIAGNOSIS — M6281 Muscle weakness (generalized): Secondary | ICD-10-CM | POA: Diagnosis not present

## 2018-05-17 DIAGNOSIS — I63511 Cerebral infarction due to unspecified occlusion or stenosis of right middle cerebral artery: Secondary | ICD-10-CM | POA: Diagnosis not present

## 2018-05-17 DIAGNOSIS — R482 Apraxia: Secondary | ICD-10-CM

## 2018-05-17 DIAGNOSIS — G8110 Spastic hemiplegia affecting unspecified side: Secondary | ICD-10-CM

## 2018-05-17 DIAGNOSIS — M25511 Pain in right shoulder: Secondary | ICD-10-CM | POA: Diagnosis not present

## 2018-05-17 DIAGNOSIS — I7771 Dissection of carotid artery: Secondary | ICD-10-CM

## 2018-05-17 DIAGNOSIS — R2689 Other abnormalities of gait and mobility: Secondary | ICD-10-CM | POA: Diagnosis not present

## 2018-05-17 DIAGNOSIS — R4701 Aphasia: Secondary | ICD-10-CM

## 2018-05-17 DIAGNOSIS — R262 Difficulty in walking, not elsewhere classified: Secondary | ICD-10-CM | POA: Diagnosis not present

## 2018-05-17 DIAGNOSIS — I69351 Hemiplegia and hemiparesis following cerebral infarction affecting right dominant side: Secondary | ICD-10-CM | POA: Diagnosis not present

## 2018-05-17 DIAGNOSIS — I10 Essential (primary) hypertension: Secondary | ICD-10-CM
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
## 2018-05-17 NOTE — Therapy (Addendum)
[place] MA[facility] SERVICES [address] [place], [place], [address] Phone: [phone]   Fax:  [phone]  Physical Therapy Treatment  Patient Details  Name: Xavier Villegas MRN: [id number] Date of Birth: Dec 22, 1980 Referring Provider (PT): Reesa Chew   Encounter Date: 05/17/2018  PT End of Session - 05/17/18 1207    Visit Number  7    Number of Visits  25    Date for PT Re-Evaluation  07/14/18    PT Start Time  1115    PT Stop Time  1200    PT Time Calculation (min)  45 min    Equipment Utilized During Treatment  Gait belt    Activity Tolerance  Patient tolerated treatment well;Patient limited by fatigue    Behavior During Therapy  [facility] for tasks assessed/performed       Past Medical History:  Diagnosis Date  . Aphasia   . GERD (gastroesophageal reflux disease)   . Hemiparesis ([place]) 02/2018   right side  . Hyperhomocysteinemia ([place])   . Neurogenic bladder 02/2018   04/26/18 inporving  . Spastic hemiparesis (H[facility])    right side  . Stroke ([place])    Left MCA infart  . Tobacco abuse     Past Surgical History:  Procedure Laterality Date  . CRANIOTOMY Left 02/21/2018   Procedure: DECOMPRESSIVE CRANIECTOMY with bone flap to abdomen;  Surgeon: [name], MD;  Location: [place];  Service: Neurosurgery;  Laterality: Left;  DECOMPRESSIVE CRANIECTOMY with bone flap to abdomen  . CRANIOTOMY N/A 05/02/2018   Procedure: RE-IMPLANTATION OF CRANIAL FLAP;  Surgeon: [name], MD;  Location: [place];  Service: Neurosurgery;  Laterality: N/A;  RE-IMPLANTATION OF CRANIAL FLAP  . IR ANGIO INTRA EXTRACRAN SEL COM CAROTID INNOMINATE UNI R MOD SED  02/19/2018  . IR ANGIO VERTEBRAL SEL VERTEBRAL BILAT MOD SED  02/19/2018  . IR CT HEAD LTD  02/19/2018  . IR PERCUTANEOUS ART THROMBECTOMY/INFUSION INTRACRANIAL INC DIAG ANGIO  02/19/2018  . IR US GUIDE VASC ACCESS RIGHT  02/19/2018  . RADIOLOGY WITH ANESTHESIA N/A 02/19/2018   Procedure: IR WITH  ANESTHESIA CODE STROKE;  Surgeon: Corrie Mckusick, DO;  Location: [place];  Service: Anesthesiology;  Laterality: N/A;  . TEE WITHOUT CARDIOVERSION N/A 03/01/2018   Procedure: TRANSESOPHAGEAL ECHOCARDIOGRAM (TEE);  Surgeon: Sanda Klein, MD;  [facility] END[facility];  Service: Cardiovascular;  Laterality: N/A;    There were no vitals filed for this visit.  Subjective Assessment - 05/17/18 1206    Subjective  Patient reports he is doing well. States he gets his staples out on Friday and will ask doctor about helmet precautions.     Pertinent History  Xavier Villegas  had left MCA infarct and subsequent craniectomy.  He was discharged from rehab several weeks ago. He has been home with his family  He is having occasional spasms on the right side still.Patient has Right hemiparesis, dysphagia, and aphasia secondary to large left MCA infarct with hemorrhagic transformation s/p hemicraniectomy.He had acute ischemic left MCA infarct and subsequent craniotomy - was first admitted on 03/01/18.  He was discharged from hospital on 04/06/18    How long can you stand comfortably?  stand for 20 mins    How long can you walk comfortably?  10 mins    Patient Stated Goals  to walk without AD and live on his own.    Currently in Pain?  No/denies    Pain Onset  More than a month ago  Squats with green pad under LLE to increase RLE weight bearing 2x10 reps without UE support, VCs to push through RLE and maintain forward trunk lean. Tactile cues to prevent hyperextension of RLE. x2 person assist (one to guard, one for knee)  Step over half bolster with RLE and LLE with LUE support x10 reps,VCs for increasing R hip and knee flexion to clear the bolster    Rolling stick to R gastrocnemius due to onset of muscle fasciculations.   Ambulation on treadmill. 4 intervals with seated rest breaks in between. CGA x2 58mn 26sec (0.06m); 47m19m20sec (0.29m43m 1min76m sec(0.06mph81mmin 549mc (0.06mph) 619militation  and tactile cueing during ambulation on treadmill: Pelvis: anterior and superior weight shift for increased stance time on RLE. ModA-independent. Verbal cues for weight shift onto RLE. Also allowed improved hip extension on R Knee: prevention of hyperextension of RLE when in stance phase Foot: hand placed along metatarsals moving into flexion and heel contact with RLE while preventing circumduction modA-independent. Verbal cues to pick up leg with increased fatigue.  Seated marches x15 each leg. Tactile facilitation provided at lateral R knee for appropriate hip flexion pattern. Visual cue provided for increased step height.                     PT Education - 05/17/18 1207    Education Details  weight shift, exercise form/technique    Person(s) Educated  Patient    Methods  Explanation;Demonstration;Verbal cues    Comprehension  Verbalized understanding       PT Short Term Goals - 05/10/18 1201      PT SHORT TERM GOAL #1   Title  Patient will be independent in home exercise program to improve strength/mobility for better functional independence with ADLs.    Baseline  05/10/18: HEP compliance    Time  6    Period  Weeks    Status  Achieved      PT SHORT TERM GOAL #2   Title  Patient (< 60 years60ld) will complete five times sit to stand test in < 10 seconds indicating an increased LE strength and improved balance.    Baseline  15.67 sec 05/10/18: 14seconds    Time  6    Period  Weeks    Status  Partially Met      PT SHORT TERM GOAL #3   Title  Patient will reduce timed up and go to <11 seconds to reduce fall risk and demonstrate improved transfer/gait ability.    Baseline  18.88 sec 05/10/18: 21seconds    Time  6    Period  Weeks    Status  On-going        PT Long Term Goals - 05/10/18 1202      PT LONG TERM GOAL #1   Title  Patient will increase six minute walk test distance to >1000 for progression to community ambulator and improve gait ability     Baseline  585 feet 05/10/18: 555ft    3f  12    Period  Weeks    Status  On-going      PT LONG TERM GOAL #2   Title  Patient will increase 10 meter walk test to >1.19m/s as t5mmprove gait speed for better community ambulation and to reduce fall risk.    Baseline  .48 m/sec 05/10/18: 0.35m/s    T69m 12    Period  Weeks    Status  New  PT LONG TERM GOAL #3   Title  Patient will increase Berg Balance score by > 6 points to demonstrate decreased fall risk during functional activities.    Baseline  25/56 05/10/18: 36/56    Time  12    Period  Weeks    Status  Partially Met      PT LONG TERM GOAL #4   Title  Patient will be require no assist with ascend/descend 3 steps using Least restrictive assistive device.    Baseline  needs rail and cues for sequencing and min assist for RLE ascending the step 05/10/18: needs rail and cues for sequencing; heavily relies on UE    Time  12    Period  Weeks    Status  Partially Met            Plan - 05/17/18 1221    Clinical Impression Statement  Patient successfully ambulated independently with adequate weight shift for 15 steps until fatigue while on treadmill. Patient still requires frequent verbal and tactile cueing for proper mechanics for gait and standing but with in session carryover noted with repetitions. Patient ambulated  maximum of 60mn 26 seconds between the four intervals before onset of muscle fatigue. Patient will continue to benefit from skilled physical therapy to improve strength, functional mobility, balance, gait, and quality of life.     Rehab Potential  Good    PT Frequency  2x / week    PT Duration  12 weeks    PT Treatment/Interventions  Manual techniques;Patient/family education;Neuromuscular re-education;Balance training;Functional mobility training;Therapeutic activities;Therapeutic exercise;Gait training;Orthotic Fit/Training;Passive range of motion;ADLs/Self Care Home Management;Electrical Stimulation;Stair  training;Energy conservation;Taping;DME Instruction    PT Next Visit Plan  strengthening, balance, gait training    PT Home Exercise Plan  standing abd bilaterally x 15    Consulted and Agree with Plan of Care  Patient       Patient will benefit from skilled therapeutic intervention in order to improve the following deficits and impairments:  Abnormal gait, Decreased balance, Decreased endurance, Decreased mobility, Difficulty walking, Impaired sensation, Increased muscle spasms, Impaired UE functional use, Impaired flexibility, Decreased strength, Decreased coordination, Decreased activity tolerance  Visit Diagnosis: Muscle weakness (generalized)  Difficulty in walking, not elsewhere classified  Other abnormalities of gait and mobility  Other lack of coordination     Problem List Patient Active Problem List   Diagnosis Date Noted  . CVA (cerebral vascular accident) ([place] 05/02/2018  . Neurogenic bowel   . Neurogenic bladder   . Spastic hemiparesis ([place]   . 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 Cerebral infarction due to embolism of left middle cerebral artery (HCC) s/p thrombectomy 03/01/2018  . Carotid artery dissection ([place] Left 03/01/2018  . Acute ischemic right MCA stroke ([place] 03/01/2018  . Right hemiparesis ([place]   . Dysphagia, post-stroke   . Global aphasia   . Leukocytosis   . Folic acid deficiency 074/25/9563 . B12 deficiency 02/23/2018  . Hyperhomocysteinemia ([place] 02/23/2018  . Smoker    AErick Blinks SPT  This entire session was performed under direct supervision and direction of a licensed therapist/therapist assistant . I have personally read, edited and approve of the note as written.   MJanna Arch PT, DPT   05/17/2018, 1:25 PM  CLower BurrellMAIN [facility]Sur[facility] LLCSERVICES [address]RLake Shore [place] [address]Phone: [phone]  Fax:  [phone] Name:  Xavier CowgerMRN: [id number]  Date of Birth: 1981/07/11

## 2018-05-17 NOTE — Progress Notes (Signed)
[facility] [address] [place]. [place] [address] [phone]       OFFICE FOLLOW UP NOTE  [name] Date of Birth:  August 12, 1980 Medical Record Number:  [id number]   Reason for Referral:  hospital stroke follow up  CHIEF COMPLAINT:  Chief Complaint  Patient presents with  . Follow-up    Follow up for stroke from hospital  room 9 pt with aunt Jenny Reichmann pt has cane    HPI: Xavier Villegas is being seen today for initial visit in the office for left MCA infarct s/p hemicraniectomy on 02/19/18. History obtained from patient, aunt and chart review. Reviewed all radiology images and labs personally.  Mr.Xavier Villegas a 37 y.o.malewith no significant past medical history other than tobacco usewho presented withright-sided weakness and aphasia.Hedid not receive IV t-PA due to late presentation.  CT head reviewed and showed large territory acute infarct left MCA territory.  Due to these findings, he was taken to MRI for consideration of IR if his infarct burden appears less than estimated by CTP but unfortunately due to other patient care issues there would have been a significant delay therefore patient was taken to IR for intervention. Patient underwent attempted thrombectomy for occlusive left ICA which resulted in left ICA dissection and possible ICH.  Postprocedure CT showed subarachnoid hemorrhage with hemorrhagic transformation.  MRI head reviewed and showed large left MCA infarct with small ICH/SDH hemorrhagic conversion.  MRA head showed left ICA and MCA occlusion.  CTA head and neck showed acute dissection left internal carotid artery with intraluminal thrombus along with 2 mm midline shift.  Repeat CT showed progressive cytotoxic edema with slight mass-effect and midline shift where he required left decompressive hemicraniotomy as well as hypertonic saline for management of edema. Repeat CT scan showed improvement after treatment.  Lower extremity venous Dopplers were negative for DVT.  Carotid Doppler showed left ICA occlusion.  2D echo showed an EF of 55 to 60% with aortic valve mass versus vegetation.  TEE was normal without evidence of vegetation.  Hypercoagulable work-up did show high homocystine at 181.7 and he was started on high-dose folate and B6 as well as B12 injection.  LDL 46 and A1c 4.9.  Patient was discharged to [facility] for continuation of therapy in stable condition. Patient was discharged home on 04/06/18 with 24 hr supervision.   Patient is being seen today for hospital follow up and is accompanied by his aunt. Under re-implantation of cranial flap on 05/02/18 which was tolerated well without complication.  He continues to have right hemiparesis with spasticity and expressive aphasia but does feel as though all have been improving.  He participates at our neuro rehab clinic where he receives PT/OT/ST.  He is able to ambulate with quad cane and use of foot drop brace but denies any recent falls.  He is able to say a few words but for the most part nods head appropriately.  He also continues to have right homonymous hemianopia and is unsure if there has been any improvement.  He was discharged from hospital stay on aspirin 325 and has continued to take this without side effects of bleeding or bruising.  Blood pressure today satisfactory at 125/80.  He currently is living with his father for continued assistance but is able to bathe and dress himself independently.  Patient appears to be in good spirits regarding recovery progress and the future.  Denies any depression-like symptoms or anxiety.  No further concerns at this  time.  Denies new or worsening stroke/TIA symptoms.    ROS:   14 system review of systems performed and negative with exception of blurred vision, headache and just interest in activities  PMH:  Past Medical History:  Diagnosis Date  . Aphasia   . GERD (gastroesophageal reflux disease)   . Hemiparesis  ([place]) 02/2018   right side  . Hyperhomocysteinemia ([place])   . Neurogenic bladder 02/2018   04/26/18 inporving  . Spastic hemiparesis ([facility])    right side  . Stroke ([place])    Left MCA infart  . Tobacco abuse     PSH:  Past Surgical History:  Procedure Laterality Date  . CRANIOTOMY Left 02/21/2018   Procedure: DECOMPRESSIVE CRANIECTOMY with bone flap to abdomen;  Surgeon: [name], MD;  Location: [facility];  Service: Neurosurgery;  Laterality: Left;  DECOMPRESSIVE CRANIECTOMY with bone flap to abdomen  . CRANIOTOMY N/A 05/02/2018   Procedure: RE-IMPLANTATION OF CRANIAL FLAP;  Surgeon: [name], MD;  Location: [facility];  Service: Neurosurgery;  Laterality: N/A;  RE-IMPLANTATION OF CRANIAL FLAP  . IR ANGIO INTRA EXTRACRAN SEL COM CAROTID INNOMINATE UNI R MOD SED  02/19/2018  . IR ANGIO VERTEBRAL SEL VERTEBRAL BILAT MOD SED  02/19/2018  . IR CT HEAD LTD  02/19/2018  . IR PERCUTANEOUS ART THROMBECTOMY/INFUSION INTRACRANIAL INC DIAG ANGIO  02/19/2018  . IR US GUIDE VASC ACCESS RIGHT  02/19/2018  . RADIOLOGY WITH ANESTHESIA N/A 02/19/2018   Procedure: IR WITH ANESTHESIA CODE STROKE;  Surgeon: Corrie Mckusick, DO;  Location: [facility];  Service: Anesthesiology;  Laterality: N/A;  . TEE WITHOUT CARDIOVERSION N/A 03/01/2018   Procedure: TRANSESOPHAGEAL ECHOCARDIOGRAM (TEE);  Surgeon: Sanda Klein, MD;  Location: [facility] ENDOSCOPY;  Service: Cardiovascular;  Laterality: N/A;    Social History:  Social History   Socioeconomic History  . Marital status: Divorced    Spouse name: Not on file  . Number of children: Not on file  . Years of education: Not on file  . Highest education level: Not on file  Occupational History  . Not on file  Social Needs  . Financial resource strain: Not on file  . Food insecurity:    Worry: Not on file    Inability: Not on file  . Transportation needs:    Medical: Not on file    Non-medical: Not on file  Tobacco Use  . Smoking status: Former Smoker    Packs/day: 0.50     Years: 8.00    Pack years: 4.00    Types: Cigarettes    Last attempt to quit: 02/19/2018    Years since quitting: 0.2  . Smokeless tobacco: Never Used  Substance and Sexual Activity  . Alcohol use: Not Currently    Alcohol/week: 14.0 standard drinks    Types: 14 Cans of beer per week  . Drug use: Never  . Sexual activity: Yes  Lifestyle  . Physical activity:    Days per week: Not on file    Minutes per session: Not on file  . Stress: Not on file  Relationships  . Social connections:    Talks on phone: Not on file    Gets together: Not on file    Attends religious service: Not on file    Active member of club or organization: Not on file    Attends meetings of clubs or organizations: Not on file    Relationship status: Not on file  . Intimate partner violence:    Fear of current or  ex partner: Not on file    Emotionally abused: Not on file    Physically abused: Not on file    Forced sexual activity: Not on file  Other Topics Concern  . Not on file  Social History Narrative  . Not on file    Family History:  Family History  Problem Relation Age of Onset  . Stroke Maternal Grandmother   . Liver cancer Maternal Grandmother   . Stroke Maternal Grandfather   . Leukemia Mother        CLL/[facility]. Leretha Pol at [facility]  . Diabetes Father   . High blood pressure Father   . Liver cancer Paternal Grandmother   . Cerebral aneurysm Paternal Grandfather   . Stroke Paternal Grandfather     Medications:   Current Outpatient Medications on File Prior to Visit  Medication Sig Dispense Refill  . acetaminophen (TYLENOL) 325 MG tablet Take 1-2 tablets (325-650 mg total) by mouth every 4 (four) hours as needed for mild pain.    Marland Kitchen acetaminophen (TYLENOL) 650 MG CR tablet Take 650 mg by mouth daily as needed for pain.    Marland Kitchen aspirin 325 MG tablet Take 1 tablet (325 mg total) by mouth daily.    . baclofen (LIORESAL) 10 MG tablet Take 1 tablet (10 mg total) by mouth 4 (four) times daily. 120 each 1   . cyanocobalamin 1000 MCG tablet Take 1 tablet (1,000 mcg total) by mouth daily. 30 tablet 1  . folic acid (FOLVITE) 1 MG tablet Take 4 tablets (4 mg total) by mouth daily. 120 tablet 1  . HYDROcodone-acetaminophen (NORCO/VICODIN) 5-325 MG tablet Take 1 tablet by mouth every 4 (four) hours as needed for moderate pain. 20 tablet 0  . Menthol-Methyl Salicylate (MUSCLE RUB) 10-15 % CREA Apply 1 application topically 2 (two) times daily. (Patient taking differently: Apply 1 application topically 2 (two) times daily as needed for muscle pain. )  0  . pantoprazole (PROTONIX) 40 MG tablet Take 40 mg by mouth as needed.     . pyridOXINE (B-6) 50 MG tablet Take 1 tablet (50 mg total) by mouth daily. 30 tablet 1  . senna-docusate (SENOKOT-S) 8.6-50 MG tablet Take 2 tablets by mouth at bedtime as needed for mild constipation.    . sulfamethoxazole-trimethoprim (BACTRIM DS,SEPTRA DS) 800-160 MG tablet Take 1 tablet by mouth 2 (two) times daily. 14 tablet 0   No current facility-administered medications on file prior to visit.     Allergies:  No Known Allergies   Physical Exam  Vitals:   05/17/18 1549  BP: 125/80  Pulse: 91  Weight: 181 lb (82.1 kg)  Height: 5\' 6"  (1.676 m)   Body mass index is 29.21 kg/m. No exam data present  General: well developed, well nourished, pleasant young Caucasian male, seated, in no evident distress Head: head normocephalic and atraumatic.   Neck: supple with no carotid or supraclavicular bruits Cardiovascular: regular rate and rhythm, no murmurs Musculoskeletal: no deformity Skin:  no rash/petichiae Vascular:  Normal pulses all extremities  Neurologic Exam Mental Status: Awake and fully alert.  Severe expressive aphasia.  Nods appropriately to yes/no questions.  Oriented to place and time. Recent and remote memory intact. Attention span, concentration and fund of knowledge appropriate. Mood and affect appropriate.  Cranial Nerves: Fundoscopic exam reveals  sharp disc margins. Pupils equal, briskly reactive to light. Extraocular movements full without nystagmus. Visual fields right homonymous hemianopia.Marland Kitchen Hearing intact. Facial sensation intact.  Mild facial paralysis right side.  Motor:  Normal bulk and tone. RUE: 0/5; RLE: 3/5; both with spasticity.  Full strength left upper and lower extremity Sensory.:  Decreased sensation right upper and lower extremity compared to left upper and lower extremity Coordination: Rapid alternating movements normal on left side. Finger-to-nose and heel-to-shin performed accurately on left side. Gait and Station: Arises from chair with mild difficulty. Stance is normal. Gait demonstrates hemiplegic gait with assistance of quad pain.  Reflexes: 1+ left upper and lower extremity.  2+ right upper and lower extremity.  Toes downgoing.    NIHSS  13 Modified Rankin  3    Diagnostic Data (Labs, Imaging, Testing)  Ct Head Code Stroke Wo Contrast 02/19/2018 1215 1. Hypodensities left frontal lobe and left occipital parietal lobe most compatible with acute infarct.Possible emboli or watershed infarct. Negative for hemorrhage or mass-effect  2. ASPECTS is 8   Ct Angio Head W Or Wo Contrast Ct Angio Neck W Or Wo Contrast Ct Cerebral Perfusion W Contrast 02/19/2018 1224 Large territory left MCA acute infarct. 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 Occlusion left A1 with reconstitution left A2. Occlusion left M1 and M2 segments.   IR Angiogram Dr Earleen Newport 02/19/2018 3:08 PM Left proximal cervical ICA dissection/thrombus Left M1 occlusion First pass, TICI 2a, with emboli in distal M1/M2 - Second pass, TICI 2a, with persistent occlusion of M2 branch - Third pass, TICI 2b Occlusive left ICA dissection, with patent ACAand maintained cross-flow from right to left Posterior circulation perfuses the  left temporal and occipital region Flat panel CT shows SAH/ICH Complications: ICH  Ct Head Wo Contrast 02/19/2018 1526 Large territory acute infarct left MCA territory.Interval development ofmultiple areas of patchy hemorrhagewithin the infarct following clot retrieval. No midline shift.  02/19/2018 2028 Left MCA territory infarction with early low-density and swelling but no mass effect or shift at this time.I think most the hyperdensity seen previously relates to contrast staining, much of which is diminishing or resolved. There is some persistent contrast staining in the region of the insula and I could not exclude that there is a small amount of admixed hemorrhage.  02/21/2018 0515 1. Progression of cytotoxic edema throughout the left MCA distribution, causing slight mass effect on the left lateral ventricle with 4 mm of rightward midline shift. No hydrocephalus. 2. Decreased density in unchanged distribution of hyperattenuating material in the left MCA territory, favored to indicate resolving contrast staining.  02/23/2018 0605 1. Interval postsurgical changes related to left hemi craniectomy. 2. Increased edema and mass effect associated with the left MCA infarct with mild herniation of the left hemisphere through the craniectomy, further effacement of left lateral ventricle, and 5 mm left-to-right midline shift. 3. Slight interval increased dilatation of right lateral ventricle may represent entrapment, attention at follow-up recommended.  02/24/2018 0517 1. Stable left MCA distribution infarction, mass effect, herniation through the left hemi craniectomy, effacement of left lateral ventricle, and 5 mm left-to-right midline shift. Stable slight dilatation of right lateral ventricle. 2. No new acute intracranial abnormality identified.  02/26/2018 0358 1. Stable left MCA distribution infarction, mass effect, herniation through the left hemi craniectomy, effacement of left lateral ventricle, and  5 mm left-to-right midline shift. Stable slight dilatation of right lateral ventricle. 2. No new acute intracranial abnormality identified.  MRI / MRA 02/20/2018 1017 Acute infarct of the entire left MCA territory with mild associated hemorrhage in the left parietal and frontal lobe. Diffuse  edema. 2 mm midline shift to the right. No other infarct Occlusion of the left internal carotid artery and entire left middle cerebral artery  Transthoracic Echocardiogram - Left ventricle: The cavity size was normal. Wall thickness wasincreased in a pattern of mild LVH. Systolic function was normal.The estimated ejection fraction was in the range of 55% to 60%.Wall motion was normal; there were no regional wall motionabnormalities. Left ventricular diastolic function parameterswere normal. - Aortic valve: There was a small, mobile vegetation on the leftventricular aspect. Impressions:   There is a small - medium sized mobile mass on the LV aspect of the aortic valve.The appearence is c/w vegetation. Aortic valve endocarditis  VAS [place] LOWER EXTREMITY VENOUS (DVT) There is no DVT or SVT noted in the bilateral lower extremities.  CUS R: no ICA stenosis. The left ICA appears occluded. Right vertebral artery flow is antegrade. Left vertebral artery not insonated secondary to patient's positioning on vent.  Dg Chest Port 1 View 02/23/2018 Continued right lower lung atelectasis.   TEE 03/01/2018  Normal TEE. No vegetation or mass on the aortic valve. No aortic dissection.    ASSESSMENT: Xavier Villegas is a 37 y.o. year old male here with large left MCA infarct on 02/19/18 secondary to left ICA dissection s/p TICI2b revascularization of L MCA M1/2 followed by decompressive craiectomy. Vascular risk factors include tobacco use, EtOH use, HTN and severe hyperhomocysteinemia.  Patient is being seen today for hospital follow-up and does continue to have right hemiparesis with spasticity,  severe expressive aphasia and right hemianopia but all have been improving per patient.    PLAN: -Continue aspirin 325 mg daily for secondary stroke prevention -Repeat CTA head/neck for evaluation of dissection -F/u with PCP regarding annual physicals and stroke risk monitoring -Continue PT/OT/ST and advised of additional orders were needed, to notify office and we will place him -continue to monitor BP at home -advised to continue to stay active at home along with home therapy exercises and maintain a healthy diet -Maintain strict control of hypertension with blood pressure goal below 130/90, diabetes with hemoglobin A1c goal below 6.5% and cholesterol with LDL cholesterol (bad cholesterol) goal below 70 mg/dL. I also advised the patient to eat a healthy diet with plenty of whole grains, cereals, fruits and vegetables, exercise regularly and maintain ideal body weight.  Follow up in 3 months or call earlier if needed   Greater than 50% of time during this 25 minute visit was spent on counseling,explanation of diagnosis of left MCA infarct, reviewing risk factor management of left ICA dissection, planning of further management, discussion with patient and family and coordination of care    Venancio Poisson, [facility]  [facility] [address] [place], [place] [address]  Phone [phone] Fax [phone] Note: This document was prepared with digital dictation and possible smart phrase technology. Any transcriptional errors that result from this process are unintentional.

## 2018-05-17 NOTE — Patient Instructions (Signed)
Continue aspirin 325 mg daily for secondary stroke prevention  Continue to follow up with PCP regarding annual follow ups and risk factor management  You will be called to schedule CT angio head and neck to assess prior dissection process   Continue therapies and if additional therapy is needed, please let us know and orders will be placed   Continue to stay active with doing therapy exercises at home along with maintaining a healthy diet  Maintain strict control of hypertension with blood pressure goal below 130/90, diabetes with hemoglobin A1c goal below 6.5% and cholesterol with LDL cholesterol (bad cholesterol) goal below 70 mg/dL. I also advised the patient to eat a healthy diet with plenty of whole grains, cereals, fruits and vegetables, exercise regularly and maintain ideal body weight.  Followup in the future with me in 3 months or call earlier if needed       Thank you for coming to see [place] at [facility]. I hope we have been able to provide you high quality care today.  You may receive a patient satisfaction survey over the next few weeks. We would appreciate your feedback and comments so that we may continue to improve ourselves and the health of our patients.

## 2018-05-17 NOTE — Therapy (Signed)
[facility] [address] [place], [place], [address] Phone: [phone]   Fax:  [phone]  Speech Language Pathology Treatment  Patient Details  Name: Xavier Villegas MRN: [id number] Date of Birth: 03-05-1981 Referring Provider (SLP): Bary Leriche    Encounter Date: 05/17/2018  End of Session - 05/17/18 1110    Visit Number  7    Number of Visits  25    Date for SLP Re-Evaluation  07/13/18    SLP Start Time  0900    SLP Stop Time   0951    SLP Time Calculation (min)  51 min    Activity Tolerance  Patient tolerated treatment well       Past Medical History:  Diagnosis Date  . Aphasia   . GERD (gastroesophageal reflux disease)   . Hemiparesis ([place]) 02/2018   right side  . Hyperhomocysteinemia ([place])   . Neurogenic bladder 02/2018   04/26/18 inporving  . Spastic hemiparesis ([facility])    right side  . Stroke ([place])    Left MCA infart  . Tobacco abuse     Past Surgical History:  Procedure Laterality Date  . CRANIOTOMY Left 02/21/2018   Procedure: DECOMPRESSIVE CRANIECTOMY with bone flap to abdomen;  Surgeon: [name], MD;  Location: [place];  Service: Neurosurgery;  Laterality: Left;  DECOMPRESSIVE CRANIECTOMY with bone flap to abdomen  . CRANIOTOMY N/A 05/02/2018   Procedure: RE-IMPLANTATION OF CRANIAL FLAP;  Surgeon: [name], MD;  Location: [place];  Service: Neurosurgery;  Laterality: N/A;  RE-IMPLANTATION OF CRANIAL FLAP  . IR ANGIO INTRA EXTRACRAN SEL COM CAROTID INNOMINATE UNI R MOD SED  02/19/2018  . IR ANGIO VERTEBRAL SEL VERTEBRAL BILAT MOD SED  02/19/2018  . IR CT HEAD LTD  02/19/2018  . IR PERCUTANEOUS ART THROMBECTOMY/INFUSION INTRACRANIAL INC DIAG ANGIO  02/19/2018  . IR US GUIDE VASC ACCESS RIGHT  02/19/2018  . RADIOLOGY WITH ANESTHESIA N/A 02/19/2018   Procedure: IR WITH ANESTHESIA CODE STROKE;  Surgeon: Corrie Mckusick, DO;  Location: [place];  Service: Anesthesiology;  Laterality: N/A;  . TEE WITHOUT  CARDIOVERSION N/A 03/01/2018   Procedure: TRANSESOPHAGEAL ECHOCARDIOGRAM (TEE);  Surgeon: Sanda Klein, MD;  Location[facility] ENDOSCOPY;  Service: Cardiovascular;  Laterality: N/A;    There were no vitals filed for this visit.  Subjective Assessment - 05/17/18 1110    Subjective  "what"            ADULT SLP TREATMENT - 05/17/18 0001      General Information   Behavior/Cognition  Alert;Cooperative;Pleasant mood    HPI   37 year old man with left MCA CVA 02/19/2018.  Patient received inpatient rehab services, including SLP, for 5 weeks.       Treatment Provided   Treatment provided  Cognitive-Linquistic      Pain Assessment   Pain Assessment  No/denies pain      Cognitive-Linquistic Treatment   Treatment focused on  Aphasia;Apraxia    Skilled Treatment  SPEECH: Patient able to consistently hum in unison and to command.  Hum into "me" with 70% accuracy and hum into "moo" sporadically.  Patient was not able to hum into other vowels this session.  Imitate 7 words, is not able to repeat X3 while maintaining phonemic accuracy.  Approximate 2 word phrase from words he can imitate ("right", "right now"). Count to 10, in unison, with 70% accuracy.  COMPREHENSION:  Answer multi-unit yes no questions with 95% accuracy.  Follow  2-unit body part commands with 75% accuracy.  Identify stated picture (novel, f=10) with 90% accuracy.    Match word to picture with 60% accuracy.  Follow 2-unit commands with 65% accuracy and 95% given repetition.  ID pictures by function with 70% accuracy. WRITING: Patient able to copy his name X1, last name with mod cues.  Trace the alphabet with 70% accuracy.      Assessment / Recommendations / Plan   Plan  Continue with current plan of care      Progression Toward Goals   Progression toward goals  Progressing toward goals       SLP Education - 05/17/18 1110    Education Details  multi-modal communication    Person(s) Educated  Patient    Methods  Explanation     Comprehension  Verbalized understanding         SLP Long Term Goals - 04/14/18 1220      SLP LONG TERM GOAL #1   Title  Patient will name objects using multi-modal strategies with 80% accuracy.    Time  12    Period  Weeks    Status  New    Target Date  07/13/18      SLP LONG TERM GOAL #2   Title  Patient will read aloud or repeat words, maintaining phonemic accuracy, with 80% accuracy.      Time  12    Period  Weeks    Status  New    Target Date  07/13/18      SLP LONG TERM GOAL #3   Title  Patient will complete 3 unit processing tasks with 80% accuracy without the need of repetition of task instructions or significant delays in responding.    Time  12    Period  Weeks    Status  New      SLP LONG TERM GOAL #4   Title  Patient will demonstrate reading comprehension for words and phrases with 80% accuracy.     Time  12    Period  Weeks    Status  New    Target Date  07/13/18       Plan - 05/17/18 1111    Clinical Impression Statement  Patient is alert and eager to work on his communication.  He demonstrates improving auditory comprehension lengthier and more complex verbal information.  He is gaining greater control for tracing and copying words.    Speech Therapy Frequency  2x / week    Duration  Other (comment)    Treatment/Interventions  Language facilitation;Multimodal communcation approach;Patient/family education;SLP instruction and feedback    Potential to Achieve Goals  Good    Potential Considerations  Ability to learn/carryover information;Pain level;Family/community support;Co-morbidities;Previous level of function;Cooperation/participation level;Severity of impairments;Medical prognosis    Consulted and Agree with Plan of Care  Patient       Patient will benefit from skilled therapeutic intervention in order to improve the following deficits and impairments:   Aphasia  Apraxia    Problem List Patient Active Problem List   Diagnosis Date Noted   . CVA (cerebral vascular accident) ([place]) 05/02/2018  . Neurogenic bowel   . Neurogenic bladder   . Spastic hemiparesis ([place])   . 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 Cerebral infarction due to embolism of left middle cerebral artery (HCC) s/p thrombectomy 03/01/2018  . Carotid artery dissection ([place]) Left 03/01/2018  . Acute ischemic right MCA stroke ([place])  03/01/2018  . Right hemiparesis ([place])   . Dysphagia, post-stroke   . Global aphasia   . Leukocytosis   . Folic acid deficiency 34/19/3790  . B12 deficiency 02/23/2018  . Hyperhomocysteinemia ([place]) 02/23/2018  . Smoker    Leroy Sea, MS/CCC- SLP  Lou Miner 05/17/2018, 11:11 AM  [facility] SERVICES [address] [place], [place], [address] Phone: [phone]   Fax:  [phone]   Name: Josehua Hammar MRN: [id number] Date of Birth: Feb 08, 1981

## 2018-05-18 ENCOUNTER — Telehealth: Payer: Self-pay | Admitting: Adult Health

## 2018-05-18 NOTE — Telephone Encounter (Signed)
lvm for pt father to be aware of this. I did leave their number of [phone] if he hasn't heard in the next 2-3 business days.

## 2018-05-18 NOTE — Progress Notes (Signed)
I agree with the above plan 

## 2018-05-18 NOTE — Telephone Encounter (Signed)
[employer] Josem Kaufmann: [id number] (exp. 05/18/18 to 06/16/18) order sent to [facility]. They will reach out to the pt to schedule.

## 2018-05-19 ENCOUNTER — Encounter: Payer: Self-pay | Admitting: Occupational Therapy

## 2018-05-19 ENCOUNTER — Ambulatory Visit: Payer: BLUE CROSS/BLUE SHIELD | Admitting: Physical Therapy

## 2018-05-19 ENCOUNTER — Ambulatory Visit: Payer: BLUE CROSS/BLUE SHIELD | Admitting: Occupational Therapy

## 2018-05-19 ENCOUNTER — Encounter: Payer: Self-pay | Admitting: Speech Pathology

## 2018-05-19 ENCOUNTER — Ambulatory Visit: Payer: BLUE CROSS/BLUE SHIELD | Admitting: Speech Pathology

## 2018-05-19 ENCOUNTER — Encounter: Payer: Self-pay | Admitting: Physical Therapy

## 2018-05-19 DIAGNOSIS — R482 Apraxia: Secondary | ICD-10-CM

## 2018-05-19 DIAGNOSIS — R262 Difficulty in walking, not elsewhere classified: Secondary | ICD-10-CM

## 2018-05-19 DIAGNOSIS — I69351 Hemiplegia and hemiparesis following cerebral infarction affecting right dominant side: Secondary | ICD-10-CM

## 2018-05-19 DIAGNOSIS — M6281 Muscle weakness (generalized): Secondary | ICD-10-CM | POA: Diagnosis not present

## 2018-05-19 DIAGNOSIS — R278 Other lack of coordination: Secondary | ICD-10-CM

## 2018-05-19 DIAGNOSIS — R2689 Other abnormalities of gait and mobility: Secondary | ICD-10-CM | POA: Diagnosis not present

## 2018-05-19 DIAGNOSIS — M25511 Pain in right shoulder: Secondary | ICD-10-CM | POA: Diagnosis not present

## 2018-05-19 DIAGNOSIS — R4701 Aphasia: Secondary | ICD-10-CM | POA: Diagnosis not present

## 2018-05-19 DIAGNOSIS — I63511 Cerebral infarction due to unspecified occlusion or stenosis of right middle cerebral artery: Secondary | ICD-10-CM | POA: Diagnosis not present
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
## 2018-05-19 NOTE — Therapy (Signed)
[place] MAIN [facility] SERVICES [address] [place], [place], [address] Phone: [phone]   Fax:  [phone]  Physical Therapy Treatment  Patient Details  Name: Xavier Villegas MRN: [id number] Date of Birth: 1981-07-25 Referring Provider (PT): Reesa Chew   Encounter Date: 05/19/2018  PT End of Session - 05/19/18 0900    Visit Number  8    Number of Visits  25    Date for PT Re-Evaluation  07/14/18    PT Start Time  1102    PT Stop Time  1145    PT Time Calculation (min)  43 min    Equipment Utilized During Treatment  Gait belt    Activity Tolerance  Patient tolerated treatment well;Patient limited by fatigue    Behavior During Therapy  [facility] for tasks assessed/performed       Past Medical History:  Diagnosis Date  . Aphasia   . GERD (gastroesophageal reflux disease)   . Hemiparesis ([place]) 02/2018   right side  . Hyperhomocysteinemia ([place])   . Neurogenic bladder 02/2018   04/26/18 inporving  . Spastic hemiparesis (HCC)    right side  . Stroke ([place])    Left MCA infart  . Tobacco abuse     Past Surgical History:  Procedure Laterality Date  . CRANIOTOMY Left 02/21/2018   Procedure: DECOMPRESSIVE CRANIECTOMY with bone flap to abdomen;  Surgeon: [name], MD;  Location: [place];  Service: Neurosurgery;  Laterality: Left;  DECOMPRESSIVE CRANIECTOMY with bone flap to abdomen  . CRANIOTOMY N/A 05/02/2018   Procedure: RE-IMPLANTATION OF CRANIAL FLAP;  Surgeon: [name], MD;  Location: [place];  Service: Neurosurgery;  Laterality: N/A;  RE-IMPLANTATION OF CRANIAL FLAP  . IR ANGIO INTRA EXTRACRAN SEL COM CAROTID INNOMINATE UNI R MOD SED  02/19/2018  . IR ANGIO VERTEBRAL SEL VERTEBRAL BILAT MOD SED  02/19/2018  . IR CT HEAD LTD  02/19/2018  . IR PERCUTANEOUS ART THROMBECTOMY/INFUSION INTRACRANIAL INC DIAG ANGIO  02/19/2018  . IR US GUIDE VASC ACCESS RIGHT  02/19/2018  . RADIOLOGY WITH ANESTHESIA N/A 02/19/2018   Procedure: IR WITH  ANESTHESIA CODE STROKE;  Surgeon: Corrie Mckusick, DO;  Location: [place];  Service: Anesthesiology;  Laterality: N/A;  . TEE WITHOUT CARDIOVERSION N/A 03/01/2018   Procedure: TRANSESOPHAGEAL ECHOCARDIOGRAM (TEE);  Surgeon: Sanda Klein, MD;  Location: [facility] ENDOSCOPY;  Service: Cardiovascular;  Laterality: N/A;    There were no vitals filed for this visit.  Subjective Assessment - 05/19/18 1103    Subjective  Patient report doing well; denies any pain; reports walking is going well at home with no new falls; He reports adherence to HEP a few times a week;     Pertinent History  Mr. Goens  had left MCA infarct and subsequent craniectomy.  He was discharged from rehab several weeks ago. He has been home with his family  He is having occasional spasms on the right side still.Patient has Right hemiparesis, dysphagia, and aphasia secondary to large left MCA infarct with hemorrhagic transformation s/p hemicraniectomy.He had acute ischemic left MCA infarct and subsequent craniotomy - was first admitted on 03/01/18.  He was discharged from hospital on 04/06/18    How long can you stand comfortably?  stand for 20 mins    How long can you walk comfortably?  10 mins    Patient Stated Goals  to walk without AD and live on his own.    Currently in Pain?  No/denies    Multiple Pain  Sites  No         PT seated next to patient (on his right side) on mat table: Partial stand with therapist verbal and tactile cues to increase weight shift to right LE, with lateral weight shift side/side to challenge quad control 2x5 reps;  Stand with therapist to right side of patient, with cues to shift to RLE, therapist to prevent right knee hyperextension with LE behind knee, with LLE hip flexion marches attempted however patient felt like RLE was going to buckle; Repositioned with therapist blocking right knee from buckling, LLE hip flexion march 2x10, with LLE toe taps to 4inch step 2x10 reps; min A and min-mod VCs for weight  shift and stance control; Also required cues to improve RLE hip extension with increased weight bearing;  Able to progress with therapist not blocking RLE knee but patient still requiring cues for lateral weight shift and to improve erect posture for better stance control;   Step over half bolster with RLE and LLE with LUE support x6 reps,VCs for increasing R hip and knee flexion to clear the bolster; patient heavily fatigued after 6 reps especially in RLE;       Gait training on treadmill: 0.5  mph x3 min, 0.7 mph x1.5 min with 2 HHA with mod VCs and tactile cues to improve weight shift to RLE during left swing with increased RLE Knee extension and hip extension in mid stance with better pelvic weight shift; Required tactile cues to avoid right knee hyperextension with initial to mid stance; min A +1 for pelvic shift and RLE placement for better control; able to exhibit improved LLE step length and control at faster pace, but then with increased speed had harder time lifting RLE due to RLE fatigue;   Patient exhibits increased fatigue at end of session, requiring short sitting rest break;                        PT Education - 05/19/18 0859    Education Details  LE strengthening, weight shift, gait safety/techniques    Person(s) Educated  Patient    Methods  Explanation;Demonstration;Verbal cues;Tactile cues    Comprehension  Verbalized understanding;Returned demonstration;Verbal cues required;Tactile cues required;Need further instruction       PT Short Term Goals - 05/10/18 1201      PT SHORT TERM GOAL #1   Title  Patient will be independent in home exercise program to improve strength/mobility for better functional independence with ADLs.    Baseline  05/10/18: HEP compliance    Time  6    Period  Weeks    Status  Achieved      PT SHORT TERM GOAL #2   Title  Patient (< 11 years old) will complete five times sit to stand test in < 10 seconds indicating an increased  LE strength and improved balance.    Baseline  15.67 sec 05/10/18: 14seconds    Time  6    Period  Weeks    Status  Partially Met      PT SHORT TERM GOAL #3   Title  Patient will reduce timed up and go to <11 seconds to reduce fall risk and demonstrate improved transfer/gait ability.    Baseline  18.88 sec 05/10/18: 21seconds    Time  6    Period  Weeks    Status  On-going        PT Long Term Goals - 05/10/18 1202  PT LONG TERM GOAL #1   Title  Patient will increase six minute walk test distance to >1000 for progression to community ambulator and improve gait ability    Baseline  585 feet 05/10/18: 589f    Time  12    Period  Weeks    Status  On-going      PT LONG TERM GOAL #2   Title  Patient will increase 10 meter walk test to >1.079m as to improve gait speed for better community ambulation and to reduce fall risk.    Baseline  .48 m/sec 05/10/18: 0.7128m   Time  12    Period  Weeks    Status  New      PT LONG TERM GOAL #3   Title  Patient will increase Berg Balance score by > 6 points to demonstrate decreased fall risk during functional activities.    Baseline  25/56 05/10/18: 36/56    Time  12    Period  Weeks    Status  Partially Met      PT LONG TERM GOAL #4   Title  Patient will be require no assist with ascend/descend 3 steps using Least restrictive assistive device.    Baseline  needs rail and cues for sequencing and min assist for RLE ascending the step 05/10/18: needs rail and cues for sequencing; heavily relies on UE    Time  12    Period  Weeks    Status  Partially Met            Plan - 05/19/18 0901    Clinical Impression Statement  Patient instructed in advanced strengthening in LE to facilitate better quad control and weight shift to right side to improve gait mechanics. He does require visual and verbal/tactile cues for correct exercise. Following exercise, instructed patient in gait training utilizing treadmill for forced use to improve gait  speed, step length and gait pattern. Patient instructed to improve RLE weight shift at initial stance and improve hip/knee extension from initial to mid stance for better foot clearance and increased step length on LLE during swing. Patient able to exhibit better gait mechanics with increased repetition. He exhibits increased fatigue at end of session. He would benefit from additional skilled PT Intervention to improve strength, balance and gait safety;     Rehab Potential  Good    PT Frequency  2x / week    PT Duration  12 weeks    PT Treatment/Interventions  Manual techniques;Patient/family education;Neuromuscular re-education;Balance training;Functional mobility training;Therapeutic activities;Therapeutic exercise;Gait training;Orthotic Fit/Training;Passive range of motion;ADLs/Self Care Home Management;Electrical Stimulation;Stair training;Energy conservation;Taping;DME Instruction    PT Next Visit Plan  strengthening, balance, gait training    PT Home Exercise Plan  standing abd bilaterally x 15    Consulted and Agree with Plan of Care  Patient       Patient will benefit from skilled therapeutic intervention in order to improve the following deficits and impairments:  Abnormal gait, Decreased balance, Decreased endurance, Decreased mobility, Difficulty walking, Impaired sensation, Increased muscle spasms, Impaired UE functional use, Impaired flexibility, Decreased strength, Decreased coordination, Decreased activity tolerance  Visit Diagnosis: Muscle weakness (generalized)  Difficulty in walking, not elsewhere classified  Other abnormalities of gait and mobility  Other lack of coordination     Problem List Patient Active Problem List   Diagnosis Date Noted  . CVA (cerebral vascular accident) ([place]CC[place]9/23/2019  . Neurogenic bowel   . Neurogenic bladder   . Sp[place] hemiparesis ([place] .  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 Cerebral infarction due to embolism of left middle cerebral artery (HCC) s/p thrombectomy 03/01/2018  . Carotid artery dissection ([place]) Left 03/01/2018  . Acute ischemic right MCA stroke ([place]) 03/01/2018  . Right hemiparesis ([place])   . Dysphagia, post-stroke   . Global aphasia   . Leukocytosis   . Folic acid deficiency 86/38/1771  . B12 deficiency 02/23/2018  . Hyperhomocysteinemia ([place]) 02/23/2018  . Smoker     Azia Toutant PT, DPT 05/19/2018, 12:23 PM  [place] MAIN [facility] SERVICES [address] [place], [place], [address] Phone: [phone]   Fax:  [phone]  Name: Xavier Villegas MRN: [id number] Date of Birth: 1980/09/08

## 2018-05-19 NOTE — Therapy (Signed)
[place] MAIN [facility] SERVICES [address] [place], [place], [address] Phone: [phone]   Fax:  [phone]  Speech Language Pathology Treatment  Patient Details  Name: Xavier Villegas MRN: [id number] Date of Birth: April 25, 1981 Referring Provider (SLP): Bary Leriche    Encounter Date: 05/19/2018  End of Session - 05/19/18 1014    Visit Number  8    Number of Visits  25    Date for SLP Re-Evaluation  07/13/18    SLP Start Time  0900    SLP Stop Time   0955    SLP Time Calculation (min)  55 min    Activity Tolerance  Patient tolerated treatment well       Past Medical History:  Diagnosis Date  . Aphasia   . GERD (gastroesophageal reflux disease)   . Hemiparesis ([place]) 02/2018   right side  . Hyperhomocysteinemia ([place])   . Neurogenic bladder 02/2018   04/26/18 inporving  . Spastic hemiparesis (HCC)    right side  . Stroke ([place])    Left MCA infart  . Tobacco abuse     Past Surgical History:  Procedure Laterality Date  . CRANIOTOMY Left 02/21/2018   Procedure: DECOMPRESSIVE CRANIECTOMY with bone flap to abdomen;  Surgeon: [name], MD;  Location: [place];  Service: Neurosurgery;  Laterality: Left;  DECOMPRESSIVE CRANIECTOMY with bone flap to abdomen  . CRANIOTOMY N/A 05/02/2018   Procedure: RE-IMPLANTATION OF CRANIAL FLAP;  Surgeon: [name], MD;  Location: [place];  Service: Neurosurgery;  Laterality: N/A;  RE-IMPLANTATION OF CRANIAL FLAP  . IR ANGIO INTRA EXTRACRAN SEL COM CAROTID INNOMINATE UNI R MOD SED  02/19/2018  . IR ANGIO VERTEBRAL SEL VERTEBRAL BILAT MOD SED  02/19/2018  . IR CT HEAD LTD  02/19/2018  . IR PERCUTANEOUS ART THROMBECTOMY/INFUSION INTRACRANIAL INC DIAG ANGIO  02/19/2018  . IR US GUIDE VASC ACCESS RIGHT  02/19/2018  . RADIOLOGY WITH ANESTHESIA N/A 02/19/2018   Procedure: IR WITH ANESTHESIA CODE STROKE;  Surgeon: Corrie Mckusick, DO;  Location: [place];  Service: Anesthesiology;  Laterality: N/A;  . TEE WITHOUT  CARDIOVERSION N/A 03/01/2018   Procedure: TRANSESOPHAGEAL ECHOCARDIOGRAM (TEE);  Surgeon: Sanda Klein, MD;  Location: [facility]COPY;  Service: Cardiovascular;  Laterality: N/A;    There were no vitals filed for this visit.  Subjective Assessment - 05/19/18 1014    Subjective  "oh yeah"            ADULT SLP TREATMENT - 05/19/18 0001      General Information   Behavior/Cognition  Alert;Cooperative;Pleasant mood    HPI   37 year old man with left MCA CVA 02/19/2018.  Patient received inpatient rehab services, including SLP, for 5 weeks.       Treatment Provided   Treatment provided  Cognitive-Linquistic      Pain Assessment   Pain Assessment  No/denies pain      Cognitive-Linquistic Treatment   Treatment focused on  Aphasia;Apraxia    Skilled Treatment  SPEECH:  Imitate 7 words, is not able to repeat X3 while maintaining phonemic accuracy.  Imitate 3 2-word phrases from words he can imitate (eg. "right", "right now"). Count to 10, in unison, with 70% accuracy.  COMPREHENSION:  Answer multi-unit yes no questions with 95% accuracy.  Follow 2-unit body part commands with 75% accuracy.  Identify stated picture (f=10) with 100% accuracy.    Match word to picture with 70% accuracy.  Follow 2-unit commands with 70% accuracy  and 100% given repetition.  WRITING: Patient able to copy his name X1, last name with mod cues.  Trace the alphabet with 70% accuracy.      Assessment / Recommendations / Plan   Plan  Continue with current plan of care      Progression Toward Goals   Progression toward goals  Progressing toward goals       SLP Education - 05/19/18 1014    Education Details  mukti-modal communication    Person(s) Educated  Patient    Methods  Explanation    Comprehension  Verbalized understanding         SLP Long Term Goals - 04/14/18 1220      SLP LONG TERM GOAL #1   Title  Patient will name objects using multi-modal strategies with 80% accuracy.    Time  12    Period   Weeks    Status  New    Target Date  07/13/18      SLP LONG TERM GOAL #2   Title  Patient will read aloud or repeat words, maintaining phonemic accuracy, with 80% accuracy.      Time  12    Period  Weeks    Status  New    Target Date  07/13/18      SLP LONG TERM GOAL #3   Title  Patient will complete 3 unit processing tasks with 80% accuracy without the need of repetition of task instructions or significant delays in responding.    Time  12    Period  Weeks    Status  New      SLP LONG TERM GOAL #4   Title  Patient will demonstrate reading comprehension for words and phrases with 80% accuracy.     Time  12    Period  Weeks    Status  New    Target Date  07/13/18       Plan - 05/19/18 1015    Clinical Impression Statement  Patient is alert and eager to work on his communication.  He demonstrates improving auditory comprehension lengthier and more complex verbal information.  He is gaining greater control for tracing and copying words.  He is demonstrating increasing creative non-verbal communication strategies.    Speech Therapy Frequency  2x / week    Duration  Other (comment)    Treatment/Interventions  Language facilitation;Multimodal communcation approach;Patient/family education;SLP instruction and feedback    Potential to Achieve Goals  Good    Potential Considerations  Ability to learn/carryover information;Pain level;Family/community support;Co-morbidities;Previous level of function;Cooperation/participation level;Severity of impairments;Medical prognosis    Consulted and Agree with Plan of Care  Patient       Patient will benefit from skilled therapeutic intervention in order to improve the following deficits and impairments:   Aphasia  Apraxia    Problem List Patient Active Problem List   Diagnosis Date Noted  . CVA (cerebral vascular accident) ([place]) 05/02/2018  . Neurogenic bowel   . Neurogenic bladder   . Spastic hemiparesis ([place])   . Monoplegia of upper  extremity following cerebral infarction affecting right dominant side ([place])   . Cerebral infarction due to embolism of left middle cerebral artery (HCC) s/p thrombectomy 03/01/2018  . Carotid artery dissection ([place]) Left 03/01/2018  . Acute ischemic right MCA stroke ([place]) 03/01/2018  . Right hemiparesis ([place])   . Dysphagia, post-stroke   . Global aphasia   . Leukocytosis   . Folic acid deficiency 56/43/3295  . B12 deficiency  02/23/2018  . Hyperhomocysteinemia ([place]) 02/23/2018  . Smoker    Leroy Sea, MS/CCC- SLP  Lou Miner 05/19/2018, 10:16 AM  [place] MAIN [facility] SERVICES [address] [place], [place], [address] Phone: [phone]   Fax:  [phone]   Name: Xavier Villegas MRN: [id number] Date of Birth: 1981-05-17

## 2018-05-24 ENCOUNTER — Ambulatory Visit: Payer: BLUE CROSS/BLUE SHIELD | Admitting: Speech Pathology

## 2018-05-24 ENCOUNTER — Encounter: Payer: Self-pay | Admitting: Occupational Therapy

## 2018-05-24 ENCOUNTER — Encounter: Payer: Self-pay | Admitting: Speech Pathology

## 2018-05-24 ENCOUNTER — Ambulatory Visit: Payer: BLUE CROSS/BLUE SHIELD

## 2018-05-24 ENCOUNTER — Ambulatory Visit: Payer: BLUE CROSS/BLUE SHIELD | Admitting: Occupational Therapy

## 2018-05-24 DIAGNOSIS — R4701 Aphasia: Secondary | ICD-10-CM | POA: Diagnosis not present

## 2018-05-24 DIAGNOSIS — M25511 Pain in right shoulder: Secondary | ICD-10-CM | POA: Diagnosis not present

## 2018-05-24 DIAGNOSIS — I63511 Cerebral infarction due to unspecified occlusion or stenosis of right middle cerebral artery: Secondary | ICD-10-CM | POA: Diagnosis not present

## 2018-05-24 DIAGNOSIS — M6281 Muscle weakness (generalized): Secondary | ICD-10-CM | POA: Diagnosis not present

## 2018-05-24 DIAGNOSIS — R262 Difficulty in walking, not elsewhere classified: Secondary | ICD-10-CM | POA: Diagnosis not present

## 2018-05-24 DIAGNOSIS — R2689 Other abnormalities of gait and mobility: Secondary | ICD-10-CM | POA: Diagnosis not present

## 2018-05-24 DIAGNOSIS — I69351 Hemiplegia and hemiparesis following cerebral infarction affecting right dominant side: Secondary | ICD-10-CM

## 2018-05-24 DIAGNOSIS — R482 Apraxia: Secondary | ICD-10-CM

## 2018-05-24 DIAGNOSIS — R278 Other lack of coordination: Secondary | ICD-10-CM | POA: Diagnosis not present

## 2018-05-24 NOTE — Therapy (Signed)
[place] MAIN [facility] SERVICES [address] [place], [place], [address] Phone: [phone]   Fax:  [phone]  Speech Language Pathology Treatment  Patient Details  Name: Xavier Villegas MRN: [id number] Date of Birth: [birth year] Referring Provider (SLP): Bary Leriche    Encounter Date: 05/24/2018  End of Session - 05/24/18 1107    Visit Number  9    Number of Visits  25    Date for SLP Re-Evaluation  07/13/18    SLP Start Time  0900    SLP Stop Time   0950    SLP Time Calculation (min)  50 min    Activity Tolerance  Patient tolerated treatment well       Past Medical History:  Diagnosis Date  . Aphasia   . GERD (gastroesophageal reflux disease)   . Hemiparesis ([place]) 02/2018   right side  . Hyperhomocysteinemia ([place])   . Neurogenic bladder 02/2018   04/26/18 inporving  . Spastic hemiparesis (HCC)    right side  . Stroke ([place])    Left MCA infart  . Tobacco abuse     Past Surgical History:  Procedure Laterality Date  . CRANIOTOMY Left 02/21/2018   Procedure: DECOMPRESSIVE CRANIECTOMY with bone flap to abdomen;  Surgeon: [name], MD;  Location: [place];  Service: Neurosurgery;  Laterality: Left;  DECOMPRESSIVE CRANIECTOMY with bone flap to abdomen  . CRANIOTOMY N/A 05/02/2018   Procedure: RE-IMPLANTATION OF CRANIAL FLAP;  Surgeon: [name], MD;  Location: [place];  Service: Neurosurgery;  Laterality: N/A;  RE-IMPLANTATION OF CRANIAL FLAP  . IR ANGIO INTRA EXTRACRAN SEL COM CAROTID INNOMINATE UNI R MOD SED  02/19/2018  . IR ANGIO VERTEBRAL SEL VERTEBRAL BILAT MOD SED  02/19/2018  . IR CT HEAD LTD  02/19/2018  . IR PERCUTANEOUS ART THROMBECTOMY/INFUSION INTRACRANIAL INC DIAG ANGIO  02/19/2018  . IR US GUIDE VASC ACCESS RIGHT  02/19/2018  . RADIOLOGY WITH ANESTHESIA N/A 02/19/2018   Procedure: IR WITH ANESTHESIA CODE STROKE;  Surgeon: Corrie Mckusick, DO;  Location: [place];  Service: Anesthesiology;  Laterality: N/A;  . TEE WITHOUT  CARDIOVERSION N/A 03/01/2018   Procedure: TRANSESOPHAGEAL ECHOCARDIOGRAM (TEE);  Surgeon: Sanda Klein, MD;  Location: [facility] ENDOSCOPY;  Service: Cardiovascular;  Laterality: N/A;    There were no vitals filed for this visit.  Subjective Assessment - 05/24/18 1106    Subjective  "okay"            ADULT SLP TREATMENT - 05/24/18 0001      General Information   Behavior/Cognition  Alert;Cooperative;Pleasant mood    HPI   37 year old man with left MCA CVA 02/19/2018.  Patient received inpatient rehab services, including SLP, for 5 weeks.       Treatment Provided   Treatment provided  Cognitive-Linquistic      Pain Assessment   Pain Assessment  No/denies pain      Cognitive-Linquistic Treatment   Treatment focused on  Aphasia;Apraxia    Skilled Treatment  SPEECH:  Imitate 7 words, is not able to repeat X3 while maintaining phonemic accuracy.  Imitate 3 2-word phrases from words he can imitate (eg. "right", "right now"). Count to 10, in unison, with 80% accuracy.  COMPREHENSION:  Answer multi-unit yes no questions with 95% accuracy.  Follow 2-unit body part commands with 75% accuracy.  Identify stated picture (f=10) with 100% accuracy.    Follow 2-unit commands with 70% accuracy and 100% given repetition.  Identify 2 pictures stated  serially with 50% accuracy.  WRITING: Patient able to copy his name X1, last name with mod cues.  Trace the alphabet with 70% accuracy.      Assessment / Recommendations / Plan   Plan  Continue with current plan of care      Progression Toward Goals   Progression toward goals  Progressing toward goals       SLP Education - 05/24/18 1107    Education Details  multi-modal communication    Person(s) Educated  Patient    Methods  Explanation    Comprehension  Verbalized understanding         SLP Long Term Goals - 04/14/18 1220      SLP LONG TERM GOAL #1   Title  Patient will name objects using multi-modal strategies with 80% accuracy.    Time  12     Period  Weeks    Status  New    Target Date  07/13/18      SLP LONG TERM GOAL #2   Title  Patient will read aloud or repeat words, maintaining phonemic accuracy, with 80% accuracy.      Time  12    Period  Weeks    Status  New    Target Date  07/13/18      SLP LONG TERM GOAL #3   Title  Patient will complete 3 unit processing tasks with 80% accuracy without the need of repetition of task instructions or significant delays in responding.    Time  12    Period  Weeks    Status  New      SLP LONG TERM GOAL #4   Title  Patient will demonstrate reading comprehension for words and phrases with 80% accuracy.     Time  12    Period  Weeks    Status  New    Target Date  07/13/18       Plan - 05/24/18 1107    Clinical Impression Statement  Patient is alert and eager to work on his communication.  He demonstrates improving auditory comprehension lengthier and more complex verbal information.  He is gaining greater control for tracing and copying words.  He is demonstrating increasing creative non-verbal communication strategies.    Speech Therapy Frequency  2x / week    Duration  Other (comment)    Treatment/Interventions  Language facilitation;Multimodal communcation approach;Patient/family education;SLP instruction and feedback    Potential to Achieve Goals  Good    Potential Considerations  Ability to learn/carryover information;Pain level;Family/community support;Co-morbidities;Previous level of function;Cooperation/participation level;Severity of impairments;Medical prognosis    Consulted and Agree with Plan of Care  Patient       Patient will benefit from skilled therapeutic intervention in order to improve the following deficits and impairments:   Aphasia  Apraxia    Problem List Patient Active Problem List   Diagnosis Date Noted  . CVA (cerebral vascular accident) ([place]) 05/02/2018  . Neurogenic bowel   . Neurogenic bladder   . Spastic hemiparesis ([place])   .  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 Cerebral infarction due to embolism of left middle cerebral artery (HCC) s/p thrombectomy 03/01/2018  . Carotid artery dissection ([place]) Left 03/01/2018  . Acute ischemic right MCA stroke ([place]) 03/01/2018  . Right hemiparesis ([place])   . Dysphagia, post-stroke   . Global aphasia   . Leukocytosis   . Folic acid deficiency 78/46/9629  . B12 deficiency  02/23/2018  . Hyperhomocysteinemia ([place]) 02/23/2018  . Smoker    Leroy Sea, MS/CCC- SLP  Lou Miner 05/24/2018, 11:08 AM  [place] MAIN [facility] SERVICES [address] [place], [place], [address] Phone: [phone]   Fax:  [phone]   Name: Xavier Villegas MRN: [id number] Date of Birth: [birth year]

## 2018-05-24 NOTE — Therapy (Signed)
[place] MAIN [facility] SERVICES [address] [place], [place], [address] Phone: [phone]   Fax:  [phone]  Physical Therapy Treatment  Patient Details  Name: Xavier Villegas MRN: [id number] Date of Birth: [birth year] Referring Provider (PT): Reesa Chew   Encounter Date: 05/24/2018  PT End of Session - 05/24/18 1151    Visit Number  9    Number of Visits  25    Date for PT Re-Evaluation  07/14/18    PT Start Time  1103    PT Stop Time  1143    PT Time Calculation (min)  40 min    Equipment Utilized During Treatment  Gait belt    Activity Tolerance  Patient tolerated treatment well;Patient limited by fatigue    Behavior During Therapy  [facility] for tasks assessed/performed       Past Medical History:  Diagnosis Date  . Aphasia   . GERD (gastroesophageal reflux disease)   . Hemiparesis ([place]) 02/2018   right side  . Hyperhomocysteinemia ([place])   . Neurogenic bladder 02/2018   04/26/18 inporving  . Spastic hemiparesis (HCC)    right side  . Stroke ([place])    Left MCA infart  . Tobacco abuse     Past Surgical History:  Procedure Laterality Date  . CRANIOTOMY Left 02/21/2018   Procedure: DECOMPRESSIVE CRANIECTOMY with bone flap to abdomen;  Surgeon: [name], MD;  Location: [place];  Service: Neurosurgery;  Laterality: Left;  DECOMPRESSIVE CRANIECTOMY with bone flap to abdomen  . CRANIOTOMY N/A 05/02/2018   Procedure: RE-IMPLANTATION OF CRANIAL FLAP;  Surgeon: [name], MD;  Location: [place];  Service: Neurosurgery;  Laterality: N/A;  RE-IMPLANTATION OF CRANIAL FLAP  . IR ANGIO INTRA EXTRACRAN SEL COM CAROTID INNOMINATE UNI R MOD SED  02/19/2018  . IR ANGIO VERTEBRAL SEL VERTEBRAL BILAT MOD SED  02/19/2018  . IR CT HEAD LTD  02/19/2018  . IR PERCUTANEOUS ART THROMBECTOMY/INFUSION INTRACRANIAL INC DIAG ANGIO  02/19/2018  . IR US GUIDE VASC ACCESS RIGHT  02/19/2018  . RADIOLOGY WITH ANESTHESIA N/A 02/19/2018   Procedure: IR WITH  ANESTHESIA CODE STROKE;  Surgeon: Corrie Mckusick, DO;  Location: [place];  Service: Anesthesiology;  Laterality: N/A;  . TEE WITHOUT CARDIOVERSION N/A 03/01/2018   Procedure: TRANSESOPHAGEAL ECHOCARDIOGRAM (TEE);  Surgeon: Sanda Klein, MD;  Location: [facility] ENDOSCOPY;  Service: Cardiovascular;  Laterality: N/A;    There were no vitals filed for this visit.  Subjective Assessment - 05/24/18 1105    Subjective  Patient reports that he is doing well, states he has not had any falls since last visit. Family member reports that he walked on grass yesterday with minimal difficulty with tree roots present.     Pertinent History  Xavier Villegas  had left MCA infarct and subsequent craniectomy.  He was discharged from rehab several weeks ago. He has been home with his family  He is having occasional spasms on the right side still.Patient has Right hemiparesis, dysphagia, and aphasia secondary to large left MCA infarct with hemorrhagic transformation s/p hemicraniectomy.He had acute ischemic left MCA infarct and subsequent craniotomy - was first admitted on 03/01/18.  He was discharged from hospital on 04/06/18    Currently in Pain?  No/denies         Treatment:  PT seated next to patient (on his right side) on mat table: Sit to stand stand with therapist verbal and tactile cues to increase weight shift to right LE, with lateral weight  shift side/side to challenge quad control 2x8 reps; (lateral shifts once in standing, 2 sit to stands total)   Partial squats from standing position with therapist on R side of patient with knee blocked from hyper extension and hip to hip contact to facilitate weight bearing on RLE. Verbal/tactile cues to facilitate weight shift to R and to push hips posteriorly for proper squat form CGA from second PT for safety throughout activity  Gait training on treadmill: 0.7 x 2.5 mins .8 x2mns .8 x 226ms with mod VCs and tactile cues to improve weight shift to RLE during left swing with  increased RLE Knee extension and hip extension in mid stance with better pelvic weight shift; Required min-modAx1 to avoid right knee hyperextension with initial to mid stance; min A +1 for pelvic shift and RLE placement for better control, ; able to exhibit improved LLE step length and control at faster pace, improved endurance compared to previous session.  Step over half bolster with RLEand LLEwith LUE support x1 rep, muscle fasciculations noted in R calf muscle, seated rest break. Second attempt of step over half bolster x1 rep bilaterally with PT block of R knee hyperextension and to facilitate hip and knee flexion. Again fasciculations in R calf, rest of activity deferred.  PT performed calf STM with roller stick x5m20m  Seated march bilaterally with tactile cues x10 reps for avoid abduction of RLE, quick taps for facilitation of adductors x8 reps with mild improvement in adductor activation and quality of movement  Seated hip adduction bilaterally with PT providing resistance, improved quality of movement and muscle activation with repetition, x15   Patient most fatigued at end of gait training, good motivation and participation to continue working with physical therapy.   PT Education - 05/24/18 1205    Education Details  weight shift, gait training/technique     Person(s) Educated  Patient    Methods  Explanation;Demonstration;Tactile cues;Verbal cues    Comprehension  Returned demonstration;Verbal cues required;Tactile cues required;Need further instruction;Verbalized understanding       PT Short Term Goals - 05/10/18 1201      PT SHORT TERM GOAL #1   Title  Patient will be independent in home exercise program to improve strength/mobility for better functional independence with ADLs.    Baseline  05/10/18: HEP compliance    Time  6    Period  Weeks    Status  Achieved      PT SHORT TERM GOAL #2   Title  Patient (< 60 80ars old) will complete five times sit to stand test in  < 10 seconds indicating an increased LE strength and improved balance.    Baseline  15.67 sec 05/10/18: 14seconds    Time  6    Period  Weeks    Status  Partially Met      PT SHORT TERM GOAL #3   Title  Patient will reduce timed up and go to <11 seconds to reduce fall risk and demonstrate improved transfer/gait ability.    Baseline  18.88 sec 05/10/18: 21seconds    Time  6    Period  Weeks    Status  On-going        PT Long Term Goals - 05/10/18 1202      PT LONG TERM GOAL #1   Title  Patient will increase six minute walk test distance to >1000 for progression to community ambulator and improve gait ability    Baseline  585 feet 05/10/18: [id number]  Time  12    Period  Weeks    Status  On-going      PT LONG TERM GOAL #2   Title  Patient will increase 10 meter walk test to >1.67ms as to improve gait speed for better community ambulation and to reduce fall risk.    Baseline  .48 m/sec 05/10/18: 0.773m    Time  12    Period  Weeks    Status  New      PT LONG TERM GOAL #3   Title  Patient will increase Berg Balance score by > 6 points to demonstrate decreased fall risk during functional activities.    Baseline  25/56 05/10/18: 36/56    Time  12    Period  Weeks    Status  Partially Met      PT LONG TERM GOAL #4   Title  Patient will be require no assist with ascend/descend 3 steps using Least restrictive assistive device.    Baseline  needs rail and cues for sequencing and min assist for RLE ascending the step 05/10/18: needs rail and cues for sequencing; heavily relies on UE    Time  12    Period  Weeks    Status  Partially Met            Plan - 05/24/18 1205    Clinical Impression Statement  Weight bearing and gait training performed at beginning of session to optimize endurance and strength. Patient with improved weight shifting with verbal/tactile cues from PT throughout session. Patient demonstrated improved endurance with gait training on treadmill compared to last  session, able to perform longer bouts of ambulation prior to fatigue. Verbal and tactile cues needed for proper posture and to facilitate weight shift to RLE, min-modAx1 to prevent R knee hyperextension, ankle DF during swing. Improved quality of movement with repetition. The patient would benefit from further skilled Pt intervention to continue to maximize mobility, gait, balance, and safety.    Rehab Potential  Good    PT Frequency  2x / week    PT Duration  12 weeks    PT Treatment/Interventions  Manual techniques;Patient/family education;Neuromuscular re-education;Balance training;Functional mobility training;Therapeutic activities;Therapeutic exercise;Gait training;Orthotic Fit/Training;Passive range of motion;ADLs/Self Care Home Management;Electrical Stimulation;Stair training;Energy conservation;Taping;DME Instruction    PT Next Visit Plan  strengthening, balance, gait training    PT Home Exercise Plan  standing abd bilaterally x 15    Consulted and Agree with Plan of Care  Patient       Patient will benefit from skilled therapeutic intervention in order to improve the following deficits and impairments:  Abnormal gait, Decreased balance, Decreased endurance, Decreased mobility, Difficulty walking, Impaired sensation, Increased muscle spasms, Impaired UE functional use, Impaired flexibility, Decreased strength, Decreased coordination, Decreased activity tolerance  Visit Diagnosis: Hemiplegia and hemiparesis following cerebral infarction affecting right dominant side (HCC)  Acute ischemic right MCA stroke (HCC)  Muscle weakness (generalized)  Difficulty in walking, not elsewhere classified  Other abnormalities of gait and mobility     Problem List Patient Active Problem List   Diagnosis Date Noted  . CVA (cerebral vascular accident) ([facility]09/23/2019  . Neurogenic bowel   . Neurogenic bladder   . Spastic hemiparesis ([facility]  . 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 Cerebral infarction due to embolism of left middle cerebral artery (HCC) s/p thrombectomy 03/01/2018  . Carotid artery dissection ([facility] 03/01/2018  . Acute ischemic right  MCA stroke ([place]) 03/01/2018  . Right hemiparesis ([place])   . Dysphagia, post-stroke   . Global aphasia   . Leukocytosis   . Folic acid deficiency 41/96/2229  . B12 deficiency 02/23/2018  . Hyperhomocysteinemia ([place]) 02/23/2018  . Smoker     Lieutenant Diego PT, DPT 12:13 PM,05/24/18 [phone]  [place] MAIN [facility] SERVICES [address] [place], [place], [address] Phone: [phone]   Fax:  [phone]  Name: Xavier Villegas MRN: [id number] Date of Birth: 11-18-1980
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
## 2018-05-25 ENCOUNTER — Ambulatory Visit
Admission: RE | Admit: 2018-05-25 | Discharge: 2018-05-25 | Disposition: A | Discharge status: Home / Self Care | Payer: BLUE CROSS/BLUE SHIELD | Source: Ambulatory Visit | Attending: Adult Health | Admitting: Adult Health

## 2018-05-25 DIAGNOSIS — I6522 Occlusion and stenosis of left carotid artery: Secondary | ICD-10-CM | POA: Diagnosis not present

## 2018-05-25 DIAGNOSIS — I63512 Cerebral infarction due to unspecified occlusion or stenosis of left middle cerebral artery: Secondary | ICD-10-CM | POA: Diagnosis not present

## 2018-05-25 MED ORDER — IOPAMIDOL (ISOVUE-370) INJECTION 76%
75.0000 mL | Freq: Once | INTRAVENOUS | Status: AC | PRN
  Administered 2018-05-25: 75 mL via INTRAVENOUS

## 2018-05-26 ENCOUNTER — Encounter: Payer: Self-pay | Admitting: Occupational Therapy

## 2018-05-26 ENCOUNTER — Ambulatory Visit: Payer: BLUE CROSS/BLUE SHIELD

## 2018-05-26 ENCOUNTER — Ambulatory Visit: Payer: BLUE CROSS/BLUE SHIELD | Admitting: Speech Pathology

## 2018-05-26 ENCOUNTER — Ambulatory Visit: Payer: BLUE CROSS/BLUE SHIELD | Admitting: Occupational Therapy

## 2018-05-26 DIAGNOSIS — M6281 Muscle weakness (generalized): Secondary | ICD-10-CM

## 2018-05-26 DIAGNOSIS — R262 Difficulty in walking, not elsewhere classified: Secondary | ICD-10-CM | POA: Diagnosis not present

## 2018-05-26 DIAGNOSIS — R482 Apraxia: Secondary | ICD-10-CM

## 2018-05-26 DIAGNOSIS — R278 Other lack of coordination: Secondary | ICD-10-CM

## 2018-05-26 DIAGNOSIS — R4701 Aphasia: Secondary | ICD-10-CM

## 2018-05-26 DIAGNOSIS — R2689 Other abnormalities of gait and mobility: Secondary | ICD-10-CM | POA: Diagnosis not present

## 2018-05-26 DIAGNOSIS — M25511 Pain in right shoulder: Secondary | ICD-10-CM | POA: Diagnosis not present

## 2018-05-26 DIAGNOSIS — I63511 Cerebral infarction due to unspecified occlusion or stenosis of right middle cerebral artery: Secondary | ICD-10-CM

## 2018-05-26 DIAGNOSIS — I69351 Hemiplegia and hemiparesis following cerebral infarction affecting right dominant side: Secondary | ICD-10-CM | POA: Diagnosis not present
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
## 2018-05-26 NOTE — Therapy (Addendum)
[place] MAIN [facility] SERVICES [address] [place], [place], [address] Phone: [phone]   Fax:  [phone]  Physical Therapy Treatment  Patient Details  Name: Xavier Villegas MRN: [id number] Date of Birth: [birth year] Referring Provider (PT): Reesa Chew   Encounter Date: 05/26/2018  PT End of Session - 05/26/18 1248    Visit Number  10    Number of Visits  25    Date for PT Re-Evaluation  07/14/18    PT Start Time  3903    PT Stop Time  1100    PT Time Calculation (min)  45 min    Equipment Utilized During Treatment  Gait belt    Activity Tolerance  Patient tolerated treatment well;Patient limited by pain    Behavior During Therapy  [facility] for tasks assessed/performed       Past Medical History:  Diagnosis Date  . Aphasia   . GERD (gastroesophageal reflux disease)   . Hemiparesis ([place]) 02/2018   right side  . Hyperhomocysteinemia ([place])   . Neurogenic bladder 02/2018   04/26/18 inporving  . Spastic hemiparesis (HCC)    right side  . Stroke ([place])    Left MCA infart  . Tobacco abuse     Past Surgical History:  Procedure Laterality Date  . CRANIOTOMY Left 02/21/2018   Procedure: DECOMPRESSIVE CRANIECTOMY with bone flap to abdomen;  Surgeon: [name], MD;  Location: [place];  Service: Neurosurgery;  Laterality: Left;  DECOMPRESSIVE CRANIECTOMY with bone flap to abdomen  . CRANIOTOMY N/A 05/02/2018   Procedure: RE-IMPLANTATION OF CRANIAL FLAP;  Surgeon: [name], MD;  Location: [place];  Service: Neurosurgery;  Laterality: N/A;  RE-IMPLANTATION OF CRANIAL FLAP  . IR ANGIO INTRA EXTRACRAN SEL COM CAROTID INNOMINATE UNI R MOD SED  02/19/2018  . IR ANGIO VERTEBRAL SEL VERTEBRAL BILAT MOD SED  02/19/2018  . IR CT HEAD LTD  02/19/2018  . IR PERCUTANEOUS ART THROMBECTOMY/INFUSION INTRACRANIAL INC DIAG ANGIO  02/19/2018  . IR US GUIDE VASC ACCESS RIGHT  02/19/2018  . RADIOLOGY WITH ANESTHESIA N/A 02/19/2018   Procedure: IR WITH  ANESTHESIA CODE STROKE;  Surgeon: Corrie Mckusick, DO;  Location: [place];  Service: Anesthesiology;  Laterality: N/A;  . TEE WITHOUT CARDIOVERSION N/A 03/01/2018   Procedure: TRANSESOPHAGEAL ECHOCARDIOGRAM (TEE);  Surgeon: Sanda Klein, MD;  Location: [facility] ENDOSCOPY;  Service: Cardiovascular;  Laterality: N/A;    There were no vitals filed for this visit.     Subjective Assessment - 05/26/18 1159    Subjective  Patient reports he is doing well. States he has not had any falls or LOB since last visit. Reports he got all of his staples taken out.     Pertinent History  [name]  had left MCA infarct and subsequent craniectomy.  He was discharged from rehab several weeks ago. He has been home with his family  He is having occasional spasms on the right side still.Patient has Right hemiparesis, dysphagia, and aphasia secondary to large left MCA infarct with hemorrhagic transformation s/p hemicraniectomy.He had acute ischemic left MCA infarct and subsequent craniotomy - was first admitted on 03/01/18.  He was discharged from hospital on 04/06/18    Currently in Pain?  No/denies      Ambulate in hallway with CGA. Verbal and tactile cues to facilitate weight shift onto RLE. Verbal cues to increase step length of LLE to increase stance phase of RLE. Tactile cues to facilitate anterior pelvic shift and gluteal activation to  increase step length and RLE stance time. Second therapist holding RUE (holding into extension) to improve weight shift and body alignment while ambulating. 1 episode R knee buckling 2x70f   PT seated next to patient (on his right side) on mat table: Standing weight shifts with therapist verbal and tactile cues to increase weight shift to right LE, with lateral weight shift side/side to challenge quad control 3 minutes in front of mirror.    Partial squats from standing position with therapist on R side of patient and hip to hip contact to facilitate weight bearing on RLE.  Verbal/tactile cues to facilitate weight shift to R and to push hips posteriorly for proper squat form CGA. Tactile cues for proper alignment of head and shoulders.    Standing with therapist on R side of patient in front of mirror CGA. Verbal cues to tap L foot to facilitate RLE weight bearing x15. Progressed to marching with LLE x10 for further weight bearing and stance of RLE, UE support required  Attempted achieving tall kneeling position with 2 therapists cues to go up with LUE onto plinth table but patient had LOB and patient was moved to seated position.   Seated march bilaterally 2x10 reps with tactile cues  for avoid abduction of RLE, R arm positioned at patient side   Seated hip adduction bilaterally with PT providing resistance, improved quality of movement and muscle activation with repetition, 2x15. Right arm positioned at patient side   Rest breaks patient was cued for upright posture and tactile cues for pelvis repositioning and head position/alignment. Ice was used following tall kneeling attempt with verbal improvements of discomfort of R knee at end of session  Second therapist utilized to keep R UE straight for postural correction to promote weight via pelvis rather than shoulders.                          PT Education - 05/26/18 1200    Education Details  weight shift, gait training/technique    Person(s) Educated  Patient    Methods  Explanation;Demonstration;Tactile cues;Verbal cues    Comprehension  Verbalized understanding;Returned demonstration;Need further instruction       PT Short Term Goals - 05/10/18 1201      PT SHORT TERM GOAL #1   Title  Patient will be independent in home exercise program to improve strength/mobility for better functional independence with ADLs.    Baseline  05/10/18: HEP compliance    Time  6    Period  Weeks    Status  Achieved      PT SHORT TERM GOAL #2   Title  Patient (< 738years old) will complete five  times sit to stand test in < 10 seconds indicating an increased LE strength and improved balance.    Baseline  15.67 sec 05/10/18: 14seconds    Time  6    Period  Weeks    Status  Partially Met      PT SHORT TERM GOAL #3   Title  Patient will reduce timed up and go to <11 seconds to reduce fall risk and demonstrate improved transfer/gait ability.    Baseline  18.88 sec 05/10/18: 21seconds    Time  6    Period  Weeks    Status  On-going        PT Long Term Goals - 05/10/18 1202      PT LONG TERM GOAL #1   Title  Patient  will increase six minute walk test distance to >1000 for progression to community ambulator and improve gait ability    Baseline  585 feet 05/10/18: 520f    Time  12    Period  Weeks    Status  On-going      PT LONG TERM GOAL #2   Title  Patient will increase 10 meter walk test to >1.02m as to improve gait speed for better community ambulation and to reduce fall risk.    Baseline  .48 m/sec 05/10/18: 0.717m   Time  12    Period  Weeks    Status  New      PT LONG TERM GOAL #3   Title  Patient will increase Berg Balance score by > 6 points to demonstrate decreased fall risk during functional activities.    Baseline  25/56 05/10/18: 36/56    Time  12    Period  Weeks    Status  Partially Met      PT LONG TERM GOAL #4   Title  Patient will be require no assist with ascend/descend 3 steps using Least restrictive assistive device.    Baseline  needs rail and cues for sequencing and min assist for RLE ascending the step 05/10/18: needs rail and cues for sequencing; heavily relies on UE    Time  12    Period  Weeks    Status  Partially Met            Plan - 05/26/18 1247    Clinical Impression Statement  Patient demonstrated improvement within session with standing marches in front of mirror with repetition likely due to improved motor initiation and ability to weight bear on RLE. Facilitation was not needed to prevent R knee hyperextension with standing  tasks demonstrating improved quad control. Patient is beginning to recognize alignment and lack of symmetry in mirror before cueing is required. Tall kneeling position was attempted but terminated due to LOB and R knee hitting table resulting in patient report of mild knee pain. Verbal reporting of knee feeling better at end of session. Patient will continue to benefit from skilled physical therapy services to improve gait, mobility, balance, and safety.     Rehab Potential  Good    PT Frequency  2x / week    PT Duration  12 weeks    PT Treatment/Interventions  Manual techniques;Patient/family education;Neuromuscular re-education;Balance training;Functional mobility training;Therapeutic activities;Therapeutic exercise;Gait training;Orthotic Fit/Training;Passive range of motion;ADLs/Self Care Home Management;Electrical Stimulation;Stair training;Energy conservation;Taping;DME Instruction    PT Next Visit Plan  strengthening, balance, gait training    PT Home Exercise Plan  standing abd bilaterally x 15    Consulted and Agree with Plan of Care  Patient       Patient will benefit from skilled therapeutic intervention in order to improve the following deficits and impairments:  Abnormal gait, Decreased balance, Decreased endurance, Decreased mobility, Difficulty walking, Impaired sensation, Increased muscle spasms, Impaired UE functional use, Impaired flexibility, Decreased strength, Decreased coordination, Decreased activity tolerance  Visit Diagnosis: Muscle weakness (generalized)  Difficulty in walking, not elsewhere classified  Other abnormalities of gait and mobility  [facility]   Problem List Patient Active Problem List   Diagnosis Date Noted  . CVA (cerebral vascular accident) ([place]9/23/2019  . Neurogenic bowel   . Neurogenic bladder   . Spastic hemiparesis ([place] . Monoplegia of upper extremity following cerebral infarction affecting right dominant  side ([place] .  Cerebral infarction due to embolism of left middle cerebral artery (HCC) s/p thrombectomy 03/01/2018  . Carotid artery dissection ([place]) Left 03/01/2018  . Acute ischemic right MCA stroke ([place]) 03/01/2018  . Right hemiparesis ([place])   . Dysphagia, post-stroke   . Global aphasia   . Leukocytosis   . Folic acid deficiency 90/17/2419  . B12 deficiency 02/23/2018  . Hyperhomocysteinemia ([place]) 02/23/2018  . Smoker    Erick Blinks, SPT  This entire session was performed under direct supervision and direction of a licensed therapist/therapist assistant . I have personally read, edited and approve of the note as written.  Janna Arch, PT, DPT   05/26/2018, 1:02 PM  [place] MAIN [facility] SERVICES [address] [place], [place], [address] Phone: [phone]   Fax:  [phone]  Name: Xavier Villegas MRN: [id number] Date of Birth: [birth year]

## 2018-05-27 ENCOUNTER — Encounter: Payer: Self-pay | Admitting: Speech Pathology

## 2018-05-27 NOTE — Therapy (Signed)
[place] MAIN [facility] SERVICES [address] [place], [place], [address] Phone: [phone]   Fax:  [phone]  Speech Language Pathology Treatment  Patient Details  Name: Xavier Villegas MRN: [id number] Date of Birth: 05/04/1981 Referring Provider (SLP): Bary Leriche    Encounter Date: 05/26/2018  End of Session - 05/27/18 1402    Visit Number  10    Number of Visits  25    Date for SLP Re-Evaluation  07/13/18    SLP Start Time  0900    SLP Stop Time   0953    SLP Time Calculation (min)  53 min    Activity Tolerance  Patient tolerated treatment well       Past Medical History:  Diagnosis Date  . Aphasia   . GERD (gastroesophageal reflux disease)   . Hemiparesis ([place]) 02/2018   right side  . Hyperhomocysteinemia ([place])   . Neurogenic bladder 02/2018   04/26/18 inporving  . Spastic hemiparesis (HCC)    right side  . Stroke ([place])    Left MCA infart  . Tobacco abuse     Past Surgical History:  Procedure Laterality Date  . CRANIOTOMY Left 02/21/2018   Procedure: DECOMPRESSIVE CRANIECTOMY with bone flap to abdomen;  Surgeon: [name], MD;  Location: [place];  Service: Neurosurgery;  Laterality: Left;  DECOMPRESSIVE CRANIECTOMY with bone flap to abdomen  . CRANIOTOMY N/A 05/02/2018   Procedure: RE-IMPLANTATION OF CRANIAL FLAP;  Surgeon: [name], MD;  Location: [place];  Service: Neurosurgery;  Laterality: N/A;  RE-IMPLANTATION OF CRANIAL FLAP  . IR ANGIO INTRA EXTRACRAN SEL COM CAROTID INNOMINATE UNI R MOD SED  02/19/2018  . IR ANGIO VERTEBRAL SEL VERTEBRAL BILAT MOD SED  02/19/2018  . IR CT HEAD LTD  02/19/2018  . IR PERCUTANEOUS ART THROMBECTOMY/INFUSION INTRACRANIAL INC DIAG ANGIO  02/19/2018  . IR US GUIDE VASC ACCESS RIGHT  02/19/2018  . RADIOLOGY WITH ANESTHESIA N/A 02/19/2018   Procedure: IR WITH ANESTHESIA CODE STROKE;  Surgeon: Corrie Mckusick, DO;  Location: [place];  Service: Anesthesiology;  Laterality: N/A;  . TEE  WITHOUT CARDIOVERSION N/A 03/01/2018   Procedure: TRANSESOPHAGEAL ECHOCARDIOGRAM (TEE);  Surgeon: Sanda Klein, MD;  [facility] END[facility];  Service: Cardiovascular;  Laterality: N/A;    There were no vitals filed for this visit.  Subjective Assessment - 05/27/18 1402    Subjective  "okay"            ADULT SLP TREATMENT - 05/27/18 0001      General Information   Behavior/Cognition  Alert;Cooperative;Pleasant mood    HPI   37 year old man with left MCA CVA 02/19/2018.  Patient received inpatient rehab services, including SLP, for 5 weeks.       Treatment Provided   Treatment provided  Cognitive-Linquistic      Pain Assessment   Pain Assessment  No/denies pain      Cognitive-Linquistic Treatment   Treatment focused on  Aphasia;Apraxia    Skilled Treatment  SPEECH:  Imitate 7 words, is not able to repeat X3 while maintaining phonemic accuracy.  Imitate 3 2-word phrases from words he can imitate (eg. "right", "right now"). Count to 10, in unison, with 80% accuracy.  COMPREHENSION:  Answer multi-unit yes no questions with 95% accuracy.  Follow 2-unit body part commands with 80% accuracy.  Identify stated picture (f=10) with 100% accuracy.    Follow 2-unit commands with 70% accuracy and 100% given repetition.  Identify 2 pictures stated  serially with 50% accuracy.  WRITING: Patient able to copy his name X1, last name with min cues.  Trace the alphabet with 100% accuracy.      Assessment / Recommendations / Plan   Plan  Continue with current plan of care      Progression Toward Goals   Progression toward goals  Progressing toward goals       SLP Education - 05/27/18 1402    Education Details  multi-modal communication    Person(s) Educated  Patient    Methods  Explanation    Comprehension  Verbalized understanding         SLP Long Term Goals - 04/14/18 1220      SLP LONG TERM GOAL #1   Title  Patient will name objects using multi-modal strategies with 80% accuracy.     Time  12    Period  Weeks    Status  New    Target Date  07/13/18      SLP LONG TERM GOAL #2   Title  Patient will read aloud or repeat words, maintaining phonemic accuracy, with 80% accuracy.      Time  12    Period  Weeks    Status  New    Target Date  07/13/18      SLP LONG TERM GOAL #3   Title  Patient will complete 3 unit processing tasks with 80% accuracy without the need of repetition of task instructions or significant delays in responding.    Time  12    Period  Weeks    Status  New      SLP LONG TERM GOAL #4   Title  Patient will demonstrate reading comprehension for words and phrases with 80% accuracy.     Time  12    Period  Weeks    Status  New    Target Date  07/13/18       Plan - 05/27/18 1403    Clinical Impression Statement  Patient is alert and eager to work on his communication.  He demonstrates improving auditory comprehension lengthier and more complex verbal information.  He is gaining greater control for tracing and copying words.  He is demonstrating increasing creative non-verbal communication strategies.    Speech Therapy Frequency  2x / week    Duration  Other (comment)    Treatment/Interventions  Language facilitation;Multimodal communcation approach;Patient/family education;SLP instruction and feedback    Potential to Achieve Goals  Good    Potential Considerations  Ability to learn/carryover information;Pain level;Family/community support;Co-morbidities;Previous level of function;Cooperation/participation level;Severity of impairments;Medical prognosis    Consulted and Agree with Plan of Care  Patient       Patient will benefit from skilled therapeutic intervention in order to improve the following deficits and impairments:   Aphasia  Apraxia    Problem List Patient Active Problem List   Diagnosis Date Noted  . CVA (cerebral vascular accident) ([place]) 05/02/2018  . Neurogenic bowel   . Neurogenic bladder   . Spastic hemiparesis ([place])   .  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 Cerebral infarction due to embolism of left middle cerebral artery (HCC) s/p thrombectomy 03/01/2018  . Carotid artery dissection ([place]) Left 03/01/2018  . Acute ischemic right MCA stroke ([place]) 03/01/2018  . Right hemiparesis ([place])   . Dysphagia, post-stroke   . Global aphasia   . Leukocytosis   . Folic acid deficiency 11/94/1740  . B12 deficiency  02/23/2018  . Hyperhomocysteinemia ([place]) 02/23/2018  . Smoker    Leroy Sea, MS/CCC- SLP  Lou Miner 05/27/2018, 2:04 PM  [place] MAIN [facility] SERVICES [address] [place], [place], [address] Phone: [phone]   Fax:  [phone]   Name: Xavier Villegas MRN: [id number] Date of Birth: 1980/09/03

## 2018-05-31 ENCOUNTER — Ambulatory Visit: Payer: BLUE CROSS/BLUE SHIELD

## 2018-05-31 ENCOUNTER — Ambulatory Visit: Payer: BLUE CROSS/BLUE SHIELD | Admitting: Speech Pathology

## 2018-05-31 ENCOUNTER — Ambulatory Visit: Payer: BLUE CROSS/BLUE SHIELD | Admitting: Occupational Therapy

## 2018-05-31 ENCOUNTER — Telehealth: Payer: Self-pay

## 2018-05-31 DIAGNOSIS — R278 Other lack of coordination: Secondary | ICD-10-CM

## 2018-05-31 DIAGNOSIS — M6281 Muscle weakness (generalized): Secondary | ICD-10-CM

## 2018-05-31 DIAGNOSIS — R2689 Other abnormalities of gait and mobility: Secondary | ICD-10-CM

## 2018-05-31 DIAGNOSIS — R4701 Aphasia: Secondary | ICD-10-CM

## 2018-05-31 DIAGNOSIS — R262 Difficulty in walking, not elsewhere classified: Secondary | ICD-10-CM | POA: Diagnosis not present

## 2018-05-31 DIAGNOSIS — I69351 Hemiplegia and hemiparesis following cerebral infarction affecting right dominant side: Secondary | ICD-10-CM | POA: Diagnosis not present

## 2018-05-31 DIAGNOSIS — I63511 Cerebral infarction due to unspecified occlusion or stenosis of right middle cerebral artery: Secondary | ICD-10-CM | POA: Diagnosis not present

## 2018-05-31 DIAGNOSIS — M25511 Pain in right shoulder: Secondary | ICD-10-CM | POA: Diagnosis not present

## 2018-05-31 DIAGNOSIS — R482 Apraxia: Secondary | ICD-10-CM

## 2018-05-31 NOTE — Therapy (Addendum)
[place] MAIN [facility] SERVICES [address] [place], [place], [address] Phone: [phone]   Fax:  [phone]  Physical Therapy Treatment  Patient Details  Name: Xavier Villegas MRN: [id number] Date of Birth: Mar 18, 1981 Referring Provider (PT): Reesa Chew   Encounter Date: 05/31/2018  PT End of Session - 05/31/18 1257    Visit Number  11    Number of Visits  25    Date for PT Re-Evaluation  07/14/18    PT Start Time  1962    PT Stop Time  1100    PT Time Calculation (min)  45 min    Equipment Utilized During Treatment  Gait belt    Activity Tolerance  Patient tolerated treatment well    Behavior During Therapy  [facility] for tasks assessed/performed       Past Medical History:  Diagnosis Date  . Aphasia   . GERD (gastroesophageal reflux disease)   . Hemiparesis ([place]) 02/2018   right side  . Hyperhomocysteinemia ([place])   . Neurogenic bladder 02/2018   04/26/18 inporving  . Spastic hemiparesis (HCC)    right side  . Stroke ([place])    Left MCA infart  . Tobacco abuse     Past Surgical History:  Procedure Laterality Date  . CRANIOTOMY Left 02/21/2018   Procedure: DECOMPRESSIVE CRANIECTOMY with bone flap to abdomen;  Surgeon: [name], MD;  Location: [place];  Service: Neurosurgery;  Laterality: Left;  DECOMPRESSIVE CRANIECTOMY with bone flap to abdomen  . CRANIOTOMY N/A 05/02/2018   Procedure: RE-IMPLANTATION OF CRANIAL FLAP;  Surgeon: [name], MD;  Location: [place];  Service: Neurosurgery;  Laterality: N/A;  RE-IMPLANTATION OF CRANIAL FLAP  . IR ANGIO INTRA EXTRACRAN SEL COM CAROTID INNOMINATE UNI R MOD SED  02/19/2018  . IR ANGIO VERTEBRAL SEL VERTEBRAL BILAT MOD SED  02/19/2018  . IR CT HEAD LTD  02/19/2018  . IR PERCUTANEOUS ART THROMBECTOMY/INFUSION INTRACRANIAL INC DIAG ANGIO  02/19/2018  . IR US GUIDE VASC ACCESS RIGHT  02/19/2018  . RADIOLOGY WITH ANESTHESIA N/A 02/19/2018   Procedure: IR WITH ANESTHESIA CODE STROKE;   Surgeon: Corrie Mckusick, DO;  Location: [place];  Service: Anesthesiology;  Laterality: N/A;  . TEE WITHOUT CARDIOVERSION N/A 03/01/2018   Procedure: TRANSESOPHAGEAL ECHOCARDIOGRAM (TEE);  Surgeon: Sanda Klein, MD;  Location: [facility] ENDOSCOPY;  Service: Cardiovascular;  Laterality: N/A;    There were no vitals filed for this visit.  Subjective Assessment - 05/31/18 1256    Subjective  Patient reports he had a good weekend. Reports no LOB or falls since last session. States his knee is fine and is not painful.     Pertinent History  [name]  had left MCA infarct and subsequent craniectomy.  He was discharged from rehab several weeks ago. He has been home with his family  He is having occasional spasms on the right side still.Patient has Right hemiparesis, dysphagia, and aphasia secondary to large left MCA infarct with hemorrhagic transformation s/p hemicraniectomy.He had acute ischemic left MCA infarct and subsequent craniotomy - was first admitted on 03/01/18.  He was discharged from hospital on 04/06/18    Currently in Pain?  No/denies               PT seated next to patient (on his right side) on mat table: Standing weight shifts with therapist verbal and tactile cues to increase weight shift to right LE, with lateral weight shift side/side to challenge quad control 3 minutes in  front of mirror. Stepping stone placed on head for proper head alignment. 2nd trial with patients RUE placed behind therapist back for improved weight shift on RLE. x15mnutes   Partial squats from standing position with therapist on L side of patient and hip to hip contact to facilitate weight bearing on RLE. 2nd therapist positioned to block knee hyperextension and prevention of knee past toe. Verbal/tactile cues to facilitate weight shift to R and to push hips posteriorly for proper squat form CGA. Tactile cues for proper alignment of head and shoulders. 2x10   Step over half bolster with RLE and LLE with LUE  support x10 rep, with PT block of R knee hyperextension and to facilitate hip and knee flexion. Tactile and visual cues used with therapist leg to prevent circumduction when stepping back.   Standing dunking basketball with hoop positioned on R side. Reaching across midline with LUE for balance and increased weight shift over RLE. x15 balls CGA  Standing reaching for cones with LUE placed to R of patient and high to promote reaching across midline and weight bearing on RLE. x24 cones CGA  Seated march bilaterally 2x10 reps with tactile cues for avoid abduction of RLE, R arm positioned at patient side  Seated LAQ BLE. X10. Mod-maxA to complete ROM with RLE. Tactile cueing of RLE for quad activation   Seated hip adduction bilaterally with PT providing resistance, improved quality of movement and muscle activation with repetition, 2x10. Right arm positioned at patient side   Rest breaks patient was cued for upright posture and tactile cues for pelvis repositioning and head position/alignment.   Second therapist utilized to keep R UE straight for postural correction to promote weight via pelvis rather than shoulders.                       PT Education - 05/31/18 1256    Education Details  weight shift, exericise technique/form    Person(s) Educated  Patient    Methods  Explanation;Demonstration;Verbal cues    Comprehension  Verbalized understanding;Returned demonstration       PT Short Term Goals - 05/10/18 1201      PT SHORT TERM GOAL #1   Title  Patient will be independent in home exercise program to improve strength/mobility for better functional independence with ADLs.    Baseline  05/10/18: HEP compliance    Time  6    Period  Weeks    Status  Achieved      PT SHORT TERM GOAL #2   Title  Patient (< 671years old) will complete five times sit to stand test in < 10 seconds indicating an increased LE strength and improved balance.    Baseline  15.67 sec 05/10/18:  14seconds    Time  6    Period  Weeks    Status  Partially Met      PT SHORT TERM GOAL #3   Title  Patient will reduce timed up and go to <11 seconds to reduce fall risk and demonstrate improved transfer/gait ability.    Baseline  18.88 sec 05/10/18: 21seconds    Time  6    Period  Weeks    Status  On-going        PT Long Term Goals - 05/10/18 1202      PT LONG TERM GOAL #1   Title  Patient will increase six minute walk test distance to >1000 for progression to community ambulator and improve gait ability  Baseline  585 feet 05/10/18: 575f    Time  12    Period  Weeks    Status  On-going      PT LONG TERM GOAL #2   Title  Patient will increase 10 meter walk test to >1.070m as to improve gait speed for better community ambulation and to reduce fall risk.    Baseline  .48 m/sec 05/10/18: 0.7125m   Time  12    Period  Weeks    Status  New      PT LONG TERM GOAL #3   Title  Patient will increase Berg Balance score by > 6 points to demonstrate decreased fall risk during functional activities.    Baseline  25/56 05/10/18: 36/56    Time  12    Period  Weeks    Status  Partially Met      PT LONG TERM GOAL #4   Title  Patient will be require no assist with ascend/descend 3 steps using Least restrictive assistive device.    Baseline  needs rail and cues for sequencing and min assist for RLE ascending the step 05/10/18: needs rail and cues for sequencing; heavily relies on UE    Time  12    Period  Weeks    Status  Partially Met            Plan - 05/31/18 1306    Clinical Impression Statement  Patient demonstrated in session carry over with improved symmetry and weight beating through RLE following partial squats. Patient challenged with reaching across midline with LUE with increased weight bearing and glute activation and was able to maintain balance. Patient had improved muscle activation with increased repeition of seated adduction squeezes and RLE control following  weight shifts and squats. Patient will continue to benefit from skilled physical therapy to improve gait, mobility, balance, and safety.     Rehab Potential  Good    PT Frequency  2x / week    PT Duration  12 weeks    PT Treatment/Interventions  Manual techniques;Patient/family education;Neuromuscular re-education;Balance training;Functional mobility training;Therapeutic activities;Therapeutic exercise;Gait training;Orthotic Fit/Training;Passive range of motion;ADLs/Self Care Home Management;Electrical Stimulation;Stair training;Energy conservation;Taping;DME Instruction    PT Next Visit Plan  strengthening, balance, gait training    PT Home Exercise Plan  standing abd bilaterally x 15    Consulted and Agree with Plan of Care  Patient       Patient will benefit from skilled therapeutic intervention in order to improve the following deficits and impairments:  Abnormal gait, Decreased balance, Decreased endurance, Decreased mobility, Difficulty walking, Impaired sensation, Increased muscle spasms, Impaired UE functional use, Impaired flexibility, Decreased strength, Decreased coordination, Decreased activity tolerance  Visit Diagnosis: Muscle weakness (generalized)  Other lack of coordination  Difficulty in walking, not elsewhere classified  Other abnormalities of gait and mobility     Problem List Patient Active Problem List   Diagnosis Date Noted  . CVA (cerebral vascular accident) ([place]CCDun[place]9/23/2019  . Neurogenic bowel   . Neurogenic bladder   . Spastic hemiparesis ([place]CC[place] . 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 Cerebral infarction due to embolism of left middle cerebral artery (HCC) s/p thrombectomy 03/01/2018  . Carotid artery dissection ([place]CCLu[place] 03/01/2018  . Acute ischemic right MCA stroke ([place]CCPl[place] Fortun[place]7/23/2019  . Right hemiparesis ([place]CC[place] . Dysphagia, post-stroke   . Global aphasia   . Leukocytosis   . Folic acid  deficiency  02/23/2018  . B12 deficiency 02/23/2018  . Hyperhomocysteinemia ([place]) 02/23/2018  . Smoker    Erick Blinks, SPT  This entire session was performed under direct supervision and direction of a licensed therapist/therapist assistant . I have personally read, edited and approve of the note as written.   Janna Arch, PT, DPT   05/31/2018, 2:14 PM  [place] MAIN [facility] SERVICES [address] [place], [place], [address] Phone: [phone]   Fax:  [phone]  Name: Thaniel Coluccio MRN: [id number] Date of Birth: April 15, 1981

## 2018-05-31 NOTE — Telephone Encounter (Signed)
Rn spoke with Chrissie Noa pts father on dpr and gave Ct results of head and neck. Rn stated is stable from prior imaging. Continue aspirin. No need for future imaging. PTs father verbalized understanding. ------

## 2018-06-01 ENCOUNTER — Encounter: Payer: Self-pay | Admitting: Speech Pathology

## 2018-06-01 ENCOUNTER — Telehealth: Payer: Self-pay

## 2018-06-01 NOTE — Telephone Encounter (Signed)
Rn spoke with Chrissie Noa pts father on dpr and gave Ct results of head and neck. Rn stated is stable from prior imaging. Continue aspirin. No need for future imaging. PTs father verbalized understanding. ------

## 2018-06-01 NOTE — Therapy (Signed)
[place] MAIN [facility] SERVICES [address] [place], [place], [address] Phone: [phone]   Fax:  [phone]  Speech Language Pathology Treatment/Progress Note  Patient Details  Name: Xavier Villegas MRN: [id number] Date of Birth: 12-04-1980 Referring Provider (SLP): Bary Leriche    Encounter Date: 05/31/2018  End of Session - 06/01/18 8003    Visit Number  11    Number of Visits  25    Date for SLP Re-Evaluation  07/13/18    SLP Start Time  0900    SLP Stop Time   0954    SLP Time Calculation (min)  54 min    Activity Tolerance  Patient tolerated treatment well       Past Medical History:  Diagnosis Date  . Aphasia   . GERD (gastroesophageal reflux disease)   . Hemiparesis ([place]) 02/2018   right side  . Hyperhomocysteinemia ([place])   . Neurogenic bladder 02/2018   04/26/18 inporving  . Spastic hemiparesis (HCC)    right side  . Stroke ([place])    Left MCA infart  . Tobacco abuse     Past Surgical History:  Procedure Laterality Date  . CRANIOTOMY Left 02/21/2018   Procedure: DECOMPRESSIVE CRANIECTOMY with bone flap to abdomen;  Surgeon: [name], MD;  Location: [place];  Service: Neurosurgery;  Laterality: Left;  DECOMPRESSIVE CRANIECTOMY with bone flap to abdomen  . CRANIOTOMY N/A 05/02/2018   Procedure: RE-IMPLANTATION OF CRANIAL FLAP;  Surgeon: [name], MD;  Location: [place];  Service: Neurosurgery;  Laterality: N/A;  RE-IMPLANTATION OF CRANIAL FLAP  . IR ANGIO INTRA EXTRACRAN SEL COM CAROTID INNOMINATE UNI R MOD SED  02/19/2018  . IR ANGIO VERTEBRAL SEL VERTEBRAL BILAT MOD SED  02/19/2018  . IR CT HEAD LTD  02/19/2018  . IR PERCUTANEOUS ART THROMBECTOMY/INFUSION INTRACRANIAL INC DIAG ANGIO  02/19/2018  . IR US GUIDE VASC ACCESS RIGHT  02/19/2018  . RADIOLOGY WITH ANESTHESIA N/A 02/19/2018   Procedure: IR WITH ANESTHESIA CODE STROKE;  Surgeon: Corrie Mckusick, DO;  Location: [place];  Service: Anesthesiology;  Laterality: N/A;   . TEE WITHOUT CARDIOVERSION N/A 03/01/2018   Procedure: TRANSESOPHAGEAL ECHOCARDIOGRAM (TEE);  Surgeon: Sanda Klein, MD;  Location: [facility] ENDOSCOPY;  Service: Cardiovascular;  Laterality: N/A;    There were no vitals filed for this visit.  Subjective Assessment - 06/01/18 0833    Subjective  "Oh yeah"            ADULT SLP TREATMENT - 06/01/18 0001      General Information   Behavior/Cognition  Alert;Cooperative;Pleasant mood    HPI   37 year old man with left MCA CVA 02/19/2018.  Patient received inpatient rehab services, including SLP, for 5 weeks.       Treatment Provided   Treatment provided  Cognitive-Linquistic      Pain Assessment   Pain Assessment  No/denies pain      Cognitive-Linquistic Treatment   Treatment focused on  Aphasia;Apraxia    Skilled Treatment  SPEECH:  Imitate 7 words, is not able to repeat X3 while maintaining phonemic accuracy.  Imitate 3 2-word phrases from words he can imitate (eg. "right", "right now"). Count to 10, in unison, with 80% accuracy.  COMPREHENSION:  Answer multi-unit yes no questions with 95% accuracy.  Follow 2-unit body part commands with 80% accuracy.  Identify stated picture (f=10) with 100% accuracy.    Follow 2-unit commands with 70% accuracy and 100% given repetition.  Identify 2  pictures stated serially with 50% accuracy.  READING: Identify letter stated on QWERTY letter board- 2 independently, 4 given verbal description, the remainder given visual cue.  WRITING: Patient able to copy his name X1, last name with min cues.  Trace the alphabet with 100% accuracy.      Assessment / Recommendations / Plan   Plan  Continue with current plan of care      Progression Toward Goals   Progression toward goals  Progressing toward goals       SLP Education - 06/01/18 0833    Education Details  multi-modal communication    Person(s) Educated  Patient    Methods  Explanation    Comprehension  Verbalized understanding         SLP  Long Term Goals - 06/01/18 0834      SLP LONG TERM GOAL #1   Title  Patient will name objects using multi-modal strategies with 80% accuracy.    Time  8    Period  Weeks    Status  Partially Met    Target Date  07/13/18      SLP LONG TERM GOAL #2   Title  Patient will read aloud or repeat words, maintaining phonemic accuracy, with 80% accuracy.      Time  8    Period  Weeks    Status  On-going    Target Date  07/13/18      SLP LONG TERM GOAL #3   Title  Patient will complete 3 unit processing tasks with 80% accuracy without the need of repetition of task instructions or significant delays in responding.    Time  8    Period  Weeks    Status  Partially Met    Target Date  07/13/18      SLP LONG TERM GOAL #4   Title  Patient will demonstrate reading comprehension for words and phrases with 80% accuracy.     Time  8    Period  Weeks    Status  On-going    Target Date  07/13/18       Plan - 06/01/18 0834    Clinical Impression Statement  Patient is alert and eager to work on his communication.  He demonstrates improving auditory comprehension for lengthier and more complex verbal information.  He is gaining greater control for tracing and copying words.  He is demonstrating increasing creative non-verbal communication strategies.  He is making minimal improvement in speech due to the severity of verbal apraxia.  Will continue ST with focus on auditory comprehension and reading/spelling.    Speech Therapy Frequency  2x / week    Duration  Other (comment)    Treatment/Interventions  Language facilitation;Multimodal communcation approach;Patient/family education;SLP instruction and feedback    Potential to Achieve Goals  Good    Potential Considerations  Ability to learn/carryover information;Pain level;Family/community support;Co-morbidities;Previous level of function;Cooperation/participation level;Severity of impairments;Medical prognosis    Consulted and Agree with Plan of Care   Patient       Patient will benefit from skilled therapeutic intervention in order to improve the following deficits and impairments:   Aphasia  Apraxia    Problem List Patient Active Problem List   Diagnosis Date Noted  . CVA (cerebral vascular accident) ([place]) 05/02/2018  . Neurogenic bowel   . Neurogenic bladder   . Spastic hemiparesis ([place])   . 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 Cerebral infarction due  to embolism of left middle cerebral artery ([place]) s/p thrombectomy 03/01/2018  . Carotid artery dissection ([place]) Left 03/01/2018  . Acute ischemic right MCA stroke ([place]) 03/01/2018  . Right hemiparesis ([place])   . Dysphagia, post-stroke   . Global aphasia   . Leukocytosis   . Folic acid deficiency 83/41/9622  . B12 deficiency 02/23/2018  . Hyperhomocysteinemia ([place]) 02/23/2018  . Smoker    Leroy Sea, MS/CCC- SLP  Lou Miner 06/01/2018, 8:36 AM  [place] MAIN [facility] SERVICES [address] [place], [place], [address] Phone: [phone]   Fax:  [phone]   Name: Kentley Blyden MRN: [id number] Date of Birth: Dec 01, 1980

## 2018-06-02 ENCOUNTER — Encounter: Payer: Self-pay | Admitting: Speech Pathology

## 2018-06-02 ENCOUNTER — Ambulatory Visit: Payer: BLUE CROSS/BLUE SHIELD | Admitting: Speech Pathology

## 2018-06-02 ENCOUNTER — Ambulatory Visit: Payer: BLUE CROSS/BLUE SHIELD | Admitting: Occupational Therapy

## 2018-06-02 ENCOUNTER — Ambulatory Visit: Payer: BLUE CROSS/BLUE SHIELD

## 2018-06-02 DIAGNOSIS — M6281 Muscle weakness (generalized): Secondary | ICD-10-CM

## 2018-06-02 DIAGNOSIS — R278 Other lack of coordination: Secondary | ICD-10-CM

## 2018-06-02 DIAGNOSIS — I69351 Hemiplegia and hemiparesis following cerebral infarction affecting right dominant side: Secondary | ICD-10-CM | POA: Diagnosis not present

## 2018-06-02 DIAGNOSIS — R482 Apraxia: Secondary | ICD-10-CM | POA: Diagnosis not present

## 2018-06-02 DIAGNOSIS — R4701 Aphasia: Secondary | ICD-10-CM | POA: Diagnosis not present

## 2018-06-02 DIAGNOSIS — R2689 Other abnormalities of gait and mobility: Secondary | ICD-10-CM

## 2018-06-02 DIAGNOSIS — M25511 Pain in right shoulder: Secondary | ICD-10-CM | POA: Diagnosis not present

## 2018-06-02 DIAGNOSIS — I63511 Cerebral infarction due to unspecified occlusion or stenosis of right middle cerebral artery: Secondary | ICD-10-CM | POA: Diagnosis not present

## 2018-06-02 DIAGNOSIS — R262 Difficulty in walking, not elsewhere classified: Secondary | ICD-10-CM | POA: Diagnosis not present

## 2018-06-02 NOTE — Therapy (Signed)
[place] MAIN [facility] SERVICES [address] [place], [place], [address] Phone: [phone]   Fax:  [phone]  Speech Language Pathology Treatment  Patient Details  Name: Xavier Villegas MRN: [id number] Date of Birth: 09/18/1980 Referring Provider (SLP): Bary Leriche    Encounter Date: 06/02/2018  End of Session - 06/02/18 1327    Visit Number  12    Number of Visits  25    Date for SLP Re-Evaluation  07/13/18    SLP Start Time  0930    SLP Stop Time   1020    SLP Time Calculation (min)  50 min    Activity Tolerance  Patient tolerated treatment well       Past Medical History:  Diagnosis Date  . Aphasia   . GERD (gastroesophageal reflux disease)   . Hemiparesis ([place]) 02/2018   right side  . Hyperhomocysteinemia ([place])   . Neurogenic bladder 02/2018   04/26/18 inporving  . Spastic hemiparesis (HCC)    right side  . Stroke ([place])    Left MCA infart  . Tobacco abuse     Past Surgical History:  Procedure Laterality Date  . CRANIOTOMY Left 02/21/2018   Procedure: DECOMPRESSIVE CRANIECTOMY with bone flap to abdomen;  Surgeon: [name], MD;  Location: [place];  Service: Neurosurgery;  Laterality: Left;  DECOMPRESSIVE CRANIECTOMY with bone flap to abdomen  . CRANIOTOMY N/A 05/02/2018   Procedure: RE-IMPLANTATION OF CRANIAL FLAP;  Surgeon: [name], MD;  Location: [place];  Service: Neurosurgery;  Laterality: N/A;  RE-IMPLANTATION OF CRANIAL FLAP  . IR ANGIO INTRA EXTRACRAN SEL COM CAROTID INNOMINATE UNI R MOD SED  02/19/2018  . IR ANGIO VERTEBRAL SEL VERTEBRAL BILAT MOD SED  02/19/2018  . IR CT HEAD LTD  02/19/2018  . IR PERCUTANEOUS ART THROMBECTOMY/INFUSION INTRACRANIAL INC DIAG ANGIO  02/19/2018  . IR US GUIDE VASC ACCESS RIGHT  02/19/2018  . RADIOLOGY WITH ANESTHESIA N/A 02/19/2018   Procedure: IR WITH ANESTHESIA CODE STROKE;  Surgeon: Corrie Mckusick, DO;  Location: [place];  Service: Anesthesiology;  Laterality: N/A;  . TEE  WITHOUT CARDIOVERSION N/A 03/01/2018   Procedure: TRANSESOPHAGEAL ECHOCARDIOGRAM (TEE);  Surgeon: Sanda Klein, MD;  Location: [facility] ENDOSCOPY;  Service: Cardiovascular;  Laterality: N/A;    There were no vitals filed for this visit.  Subjective Assessment - 06/02/18 1324    Subjective  "Oh yeah"            ADULT SLP TREATMENT - 06/02/18 0001      General Information   Behavior/Cognition  Alert;Cooperative;Pleasant mood    HPI   37 year old man with left MCA CVA 02/19/2018.  Patient received inpatient rehab services, including SLP, for 5 weeks.       Treatment Provided   Treatment provided  Cognitive-Linquistic      Pain Assessment   Pain Assessment  No/denies pain      Cognitive-Linquistic Treatment   Treatment focused on  Aphasia;Apraxia    Skilled Treatment  COMPREHENSION:  Answer multi-unit yes no questions with 95% accuracy.  Follow 2-unit body part commands with 80% accuracy.  Identify stated picture (f=10) with 100% accuracy.    Follow 2-unit commands with 70% accuracy and 100% given repetition.  Identify 2 pictures stated serially with 50% accuracy.  READING: Identify letter stated on QWERTY letter board- given visual cue.  WRITING: point to letters in his name given visual cue.      Assessment / Recommendations / Plan  Plan  Continue with current plan of care      Progression Toward Goals   Progression toward goals  Progressing toward goals       SLP Education - 06/02/18 1327    Education Details  multi-modal communication    Person(s) Educated  Patient    Methods  Explanation    Comprehension  Verbalized understanding         SLP Long Term Goals - 06/01/18 0834      SLP LONG TERM GOAL #1   Title  Patient will name objects using multi-modal strategies with 80% accuracy.    Time  8    Period  Weeks    Status  Partially Met    Target Date  07/13/18      SLP LONG TERM GOAL #2   Title  Patient will read aloud or repeat words, maintaining phonemic  accuracy, with 80% accuracy.      Time  8    Period  Weeks    Status  On-going    Target Date  07/13/18      SLP LONG TERM GOAL #3   Title  Patient will complete 3 unit processing tasks with 80% accuracy without the need of repetition of task instructions or significant delays in responding.    Time  8    Period  Weeks    Status  Partially Met    Target Date  07/13/18      SLP LONG TERM GOAL #4   Title  Patient will demonstrate reading comprehension for words and phrases with 80% accuracy.     Time  8    Period  Weeks    Status  On-going    Target Date  07/13/18       Plan - 06/02/18 1327    Clinical Impression Statement  Patient is alert and eager to work on his communication.  He demonstrates improving auditory comprehension for lengthier and more complex verbal information.  He is gaining greater control for tracing and copying words.  He is demonstrating increasing creative non-verbal communication strategies.  He is making minimal improvement in speech due to the severity of verbal apraxia.  Will continue ST with focus on auditory comprehension and reading/spelling.    Speech Therapy Frequency  2x / week    Duration  Other (comment)    Treatment/Interventions  Language facilitation;Multimodal communcation approach;Patient/family education;SLP instruction and feedback    Potential to Achieve Goals  Good    Potential Considerations  Ability to learn/carryover information;Pain level;Family/community support;Co-morbidities;Previous level of function;Cooperation/participation level;Severity of impairments;Medical prognosis    Consulted and Agree with Plan of Care  Patient       Patient will benefit from skilled therapeutic intervention in order to improve the following deficits and impairments:   Aphasia  Apraxia    Problem List Patient Active Problem List   Diagnosis Date Noted  . CVA (cerebral vascular accident) ([place]) 05/02/2018  . Neurogenic bowel   . Neurogenic  bladder   . Spastic hemiparesis ([place])   . 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 Cerebral infarction due to embolism of left middle cerebral artery (HCC) s/p thrombectomy 03/01/2018  . Carotid artery dissection ([place]) Left 03/01/2018  . Acute ischemic right MCA stroke ([place]) 03/01/2018  . Right hemiparesis ([place])   . Dysphagia, post-stroke   . Global aphasia   . Leukocytosis   . Folic acid deficiency 86/38/1771  . B12 deficiency 02/23/2018  .  Hyperhomocysteinemia ([place]) 02/23/2018  . Smoker    Leroy Sea, MS/CCC- SLP  Lou Miner 06/02/2018, 1:28 PM  [place] MAIN [facility] SERVICES [address] [place], [place], [address] Phone: [phone]   Fax:  [phone]   Name: Xavier Villegas MRN: [id number] Date of Birth: [birth year]

## 2018-06-02 NOTE — Therapy (Addendum)
[place] MAIN [facility] SERVICES [address] [place], [place], [address] Phone: [phone]   Fax:  [phone]  Physical Therapy Treatment  Patient Details  Name: Xavier Villegas MRN: [id number] Date of Birth: Apr 27, 1981 Referring Provider (PT): Reesa Chew   Encounter Date: 06/02/2018  PT End of Session - 06/02/18 1201    Visit Number  12    Number of Visits  25    Date for PT Re-Evaluation  07/14/18    PT Start Time  1030    PT Stop Time  1114    PT Time Calculation (min)  44 min    Equipment Utilized During Treatment  Gait belt    Activity Tolerance  Patient tolerated treatment well    Behavior During Therapy  [facility] for tasks assessed/performed       Past Medical History:  Diagnosis Date  . Aphasia   . GERD (gastroesophageal reflux disease)   . Hemiparesis ([place]) 02/2018   right side  . Hyperhomocysteinemia ([place])   . Neurogenic bladder 02/2018   04/26/18 inporving  . Spastic hemiparesis (HCC)    right side  . Stroke ([place])    Left MCA infart  . Tobacco abuse     Past Surgical History:  Procedure Laterality Date  . CRANIOTOMY Left 02/21/2018   Procedure: DECOMPRESSIVE CRANIECTOMY with bone flap to abdomen;  Surgeon: [name], MD;  Location: [place];  Service: Neurosurgery;  Laterality: Left;  DECOMPRESSIVE CRANIECTOMY with bone flap to abdomen  . CRANIOTOMY N/A 05/02/2018   Procedure: RE-IMPLANTATION OF CRANIAL FLAP;  Surgeon: [name], MD;  Location: [place];  Service: Neurosurgery;  Laterality: N/A;  RE-IMPLANTATION OF CRANIAL FLAP  . IR ANGIO INTRA EXTRACRAN SEL COM CAROTID INNOMINATE UNI R MOD SED  02/19/2018  . IR ANGIO VERTEBRAL SEL VERTEBRAL BILAT MOD SED  02/19/2018  . IR CT HEAD LTD  02/19/2018  . IR PERCUTANEOUS ART THROMBECTOMY/INFUSION INTRACRANIAL INC DIAG ANGIO  02/19/2018  . IR US GUIDE VASC ACCESS RIGHT  02/19/2018  . RADIOLOGY WITH ANESTHESIA N/A 02/19/2018   Procedure: IR WITH ANESTHESIA CODE STROKE;   Surgeon: Corrie Mckusick, DO;  Location: [place];  Service: Anesthesiology;  Laterality: N/A;  . TEE WITHOUT CARDIOVERSION N/A 03/01/2018   Procedure: TRANSESOPHAGEAL ECHOCARDIOGRAM (TEE);  Surgeon: Sanda Klein, MD;  Location: [facility] ENDOSCOPY;  Service: Cardiovascular;  Laterality: N/A;    There were no vitals filed for this visit.  Subjective Assessment - 06/02/18 1125    Subjective  Patient reports he is doing well. States he has not fallen since last visit.     Pertinent History  Xavier Villegas  had left MCA infarct and subsequent craniectomy.  He was discharged from rehab several weeks ago. He has been home with his family  He is having occasional spasms on the right side still.Patient has Right hemiparesis, dysphagia, and aphasia secondary to large left MCA infarct with hemorrhagic transformation s/p hemicraniectomy.He had acute ischemic left MCA infarct and subsequent craniotomy - was first admitted on 03/01/18.  He was discharged from hospital on 04/06/18    Currently in Pain?  No/denies           Nustep lvl 3 29mnutes >60rpm for cardiovascular challenge. : use of lateral leg support attachment for RLE. 30second rest then completed another 3 minutes. Vitals after exercise.  HR 83bpm 98 O2  PT seated next to patient (on his right side) on mat table: Standing weight shifts with therapist verbal and tactile cues  to increase weight shift to right LE, with lateral weight shift side/side to challenge quad control 3 minutes in front of mirror. Placed patients RUE placed behind therapist back for improved weight shift on RLE. x35mnutes   Standing next to patient on his R with RUE placed behind therapist back. Left toe tapping LLE to facilitate weight bearing on RLE. Progressed to standing marches with LLE. Mild tactile cue to prevent R knee hyperextension which was faded with duration. x10 toe taps; x15 marches CGA  Partial squats from standing position with therapist in seated positioned to block R  knee hyperextension and prevention of knee past toe. Verbal/tactile cues to facilitate weight shift to R and to push hips posteriorly for proper squat form CGA. Tactile cues for proper alignment of head and shoulders. 2x10     Supine on table Bridges2 x10. MinA required to stabilize LE and prevent abduction  SAQ 2x10 each leg. Verbal and visual cues for exercise technique.Tactile cues for quad activation. Mod-maxA to complete ROM on RLE. Hip flexor activation on RLE as compensatory pattern    Rest breaks patient was cued for upright posture and tactile cues for pelvis repositioning and head position/alignment.                      PT Education - 06/02/18 1201    Education Details  weight shift, exercise technique/form    Person(s) Educated  Patient    Methods  Explanation;Demonstration;Verbal cues    Comprehension  Verbalized understanding;Returned demonstration       PT Short Term Goals - 05/10/18 1201      PT SHORT TERM GOAL #1   Title  Patient will be independent in home exercise program to improve strength/mobility for better functional independence with ADLs.    Baseline  05/10/18: HEP compliance    Time  6    Period  Weeks    Status  Achieved      PT SHORT TERM GOAL #2   Title  Patient (< 637years old) will complete five times sit to stand test in < 10 seconds indicating an increased LE strength and improved balance.    Baseline  15.67 sec 05/10/18: 14seconds    Time  6    Period  Weeks    Status  Partially Met      PT SHORT TERM GOAL #3   Title  Patient will reduce timed up and go to <11 seconds to reduce fall risk and demonstrate improved transfer/gait ability.    Baseline  18.88 sec 05/10/18: 21seconds    Time  6    Period  Weeks    Status  On-going        PT Long Term Goals - 05/10/18 1202      PT LONG TERM GOAL #1   Title  Patient will increase six minute walk test distance to >1000 for progression to community ambulator and improve gait  ability    Baseline  585 feet 05/10/18: 5545f   Time  12    Period  Weeks    Status  On-going      PT LONG TERM GOAL #2   Title  Patient will increase 10 meter walk test to >1.83m72mas to improve gait speed for better community ambulation and to reduce fall risk.    Baseline  .48 m/sec 05/10/18: 0.45m74m  Time  12    Period  Weeks    Status  New  PT LONG TERM GOAL #3   Title  Patient will increase Berg Balance score by > 6 points to demonstrate decreased fall risk during functional activities.    Baseline  25/56 05/10/18: 36/56    Time  12    Period  Weeks    Status  Partially Met      PT LONG TERM GOAL #4   Title  Patient will be require no assist with ascend/descend 3 steps using Least restrictive assistive device.    Baseline  needs rail and cues for sequencing and min assist for RLE ascending the step 05/10/18: needs rail and cues for sequencing; heavily relies on UE    Time  12    Period  Weeks    Status  Partially Met            Plan - 06/02/18 1125    Clinical Impression Statement  Introduced nustep today for cardiovascular challenge and activation of R hip flexors in proper alignment. Patient challenged with SAQ with RLE due to mass recruitment of LE musculature including hip flexors limiting ability to isolate quadricep. This improved following assistance and tactile cueing for 1-2 reps. Patient is developing improved weight shift and recognition of alignment in mirror during weigh shifting and squatting exercises. Patient will continue to benefit from skilled physical therapy to improve gait, mobility, balance, and safety    Rehab Potential  Good    PT Frequency  2x / week    PT Duration  12 weeks    PT Treatment/Interventions  Manual techniques;Patient/family education;Neuromuscular re-education;Balance training;Functional mobility training;Therapeutic activities;Therapeutic exercise;Gait training;Orthotic Fit/Training;Passive range of motion;ADLs/Self Care Home  Management;Electrical Stimulation;Stair training;Energy conservation;Taping;DME Instruction    PT Next Visit Plan  strengthening, balance, gait training    PT Home Exercise Plan  standing abd bilaterally x 15    Consulted and Agree with Plan of Care  Patient       Patient will benefit from skilled therapeutic intervention in order to improve the following deficits and impairments:  Abnormal gait, Decreased balance, Decreased endurance, Decreased mobility, Difficulty walking, Impaired sensation, Increased muscle spasms, Impaired UE functional use, Impaired flexibility, Decreased strength, Decreased coordination, Decreased activity tolerance  Visit Diagnosis: Muscle weakness (generalized)  Other lack of coordination  Difficulty in walking, not elsewhere classified  Other abnormalities of gait and mobility     Problem List Patient Active Problem List   Diagnosis Date Noted  . CVA (cerebral vascular accident) ([place]) 05/02/2018  . Neurogenic bowel   . Neurogenic bladder   . Spastic hemiparesis ([place])   . 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 Cerebral infarction due to embolism of left middle cerebral artery (HCC) s/p thrombectomy 03/01/2018  . Carotid artery dissection ([place]) Left 03/01/2018  . Acute ischemic right MCA stroke ([place]) 03/01/2018  . Right hemiparesis ([place])   . Dysphagia, post-stroke   . Global aphasia   . Leukocytosis   . Folic acid deficiency 28/41/3244  . B12 deficiency 02/23/2018  . Hyperhomocysteinemia ([place]) 02/23/2018  . Smoker    Xavier Villegas, SPT  This entire session was performed under direct supervision and direction of a licensed therapist/therapist assistant . I have personally read, edited and approve of the note as written.  Janna Arch, PT, DPT   06/02/2018, 3:09 PM  [place] MAIN [facility] SERVICES [address] [place], [place], [address] Phone:  [phone]   Fax:  [phone]  Name: Deon Duer [facility]  MRN: [id number] Date of Birth: 01-19-1981

## 2018-06-04 ENCOUNTER — Encounter: Payer: Self-pay | Admitting: Occupational Therapy
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
## 2018-06-07 ENCOUNTER — Ambulatory Visit: Payer: BLUE CROSS/BLUE SHIELD | Admitting: Speech Pathology

## 2018-06-07 ENCOUNTER — Ambulatory Visit: Payer: BLUE CROSS/BLUE SHIELD

## 2018-06-07 ENCOUNTER — Ambulatory Visit: Payer: BLUE CROSS/BLUE SHIELD | Admitting: Occupational Therapy

## 2018-06-07 ENCOUNTER — Encounter: Payer: Self-pay | Admitting: Speech Pathology

## 2018-06-07 DIAGNOSIS — R262 Difficulty in walking, not elsewhere classified: Secondary | ICD-10-CM

## 2018-06-07 DIAGNOSIS — M25511 Pain in right shoulder: Secondary | ICD-10-CM | POA: Diagnosis not present

## 2018-06-07 DIAGNOSIS — M6281 Muscle weakness (generalized): Secondary | ICD-10-CM

## 2018-06-07 DIAGNOSIS — R2689 Other abnormalities of gait and mobility: Secondary | ICD-10-CM

## 2018-06-07 DIAGNOSIS — R4701 Aphasia: Secondary | ICD-10-CM

## 2018-06-07 DIAGNOSIS — I63511 Cerebral infarction due to unspecified occlusion or stenosis of right middle cerebral artery: Secondary | ICD-10-CM | POA: Diagnosis not present

## 2018-06-07 DIAGNOSIS — R482 Apraxia: Secondary | ICD-10-CM | POA: Diagnosis not present

## 2018-06-07 DIAGNOSIS — I69351 Hemiplegia and hemiparesis following cerebral infarction affecting right dominant side: Secondary | ICD-10-CM | POA: Diagnosis not present

## 2018-06-07 DIAGNOSIS — R278 Other lack of coordination: Secondary | ICD-10-CM

## 2018-06-07 NOTE — Therapy (Addendum)
Xavier Villegas  [facility] [address] [place], [place], [address] Phone: [phone]   Fax:  [phone]  Physical Therapy Treatment  Patient Details  Name: Xavier Villegas MRN: [id number] Date of Birth: 06/05/1981 Referring Provider (PT): Love, Pamela   Encounter Date: 06/07/2018  PT End of Session - 06/07/18 1251    Visit Number  13    Number of Visits  25    Date for PT Re-Evaluation  07/14/18    PT Start Time  1107    PT Stop Time  1155    PT Time Calculation (min)  48 min    Equipment Utilized During Treatment  Gait belt    Activity Tolerance  Patient tolerated treatment well    Behavior During Therapy  WFL for tasks assessed/performed       Past Medical History:  Diagnosis Date  . Aphasia   . GERD (gastroesophageal reflux disease)   . Hemiparesis (HCC) 02/2018   right side  . Hyperhomocysteinemia (HCC)   . Neurogenic bladder 02/2018   04/26/18 inporving  . Spastic hemiparesis (HCC)    right side  . Stroke (HCC)    Left MCA infart  . Tobacco abuse     Past Surgical History:  Procedure Laterality Date  . CRANIOTOMY Left 02/21/2018   Procedure: DECOMPRESSIVE CRANIECTOMY with bone flap to abdomen;  Surgeon: Cram, Gary, MD;  Location: [facility] OR;  Service: Neurosurgery;  Laterality: Left;  DECOMPRESSIVE CRANIECTOMY with bone flap to abdomen  . CRANIOTOMY N/A 05/02/2018   Procedure: RE-IMPLANTATION OF CRANIAL FLAP;  Surgeon: Cram, Gary, MD;  Location: [facility];  Service: Neurosurgery;  Laterality: N/A;  RE-IMPLANTATION OF CRANIAL FLAP  . IR ANGIO INTRA EXTRACRAN SEL COM CAROTID INNOMINATE UNI R MOD SED  02/19/2018  . IR ANGIO VERTEBRAL SEL VERTEBRAL BILAT MOD SED  02/19/2018  . IR CT HEAD LTD  02/19/2018  . IR PERCUTANEOUS ART THROMBECTOMY/INFUSION INTRACRANIAL INC DIAG ANGIO  02/19/2018  . IR US GUIDE VASC ACCESS RIGHT  02/19/2018  . RADIOLOGY WITH ANESTHESIA N/A 02/19/2018   Procedure: IR WITH ANESTHESIA CODE STROKE;   Surgeon: Wagner, Jaime, DO;  Location: [facility];  Service: Anesthesiology;  Laterality: N/A;  . TEE WITHOUT CARDIOVERSION N/A 03/01/2018   Procedure: TRANSESOPHAGEAL ECHOCARDIOGRAM (TEE);  Surgeon: [name], [name], MD;  Location: [facility];  Service: Cardiovascular;  Laterality: N/A;    There were no vitals filed for this visit.  Subjective Assessment - 06/07/18 1248    Subjective  Patient states he had a pretty good weekend. States he does not have pain or have had any falls since last session. Patient's aunt reports he has difficulty with getting out of large couch. States he ambulates around home sometimes without the cane. She also reports he walks out onto the porch.     Patient is accompained by:  Family member    Pertinent History  Xavier Villegas  had left MCA infarct and subsequent craniectomy.  He was discharged from rehab several weeks ago. He has been home with his family  He is having occasional spasms on the right side still.Patient has Right hemiparesis, dysphagia, and aphasia secondary to large left MCA infarct with hemorrhagic transformation s/p hemicraniectomy.He had acute ischemic left MCA infarct and subsequent craniotomy - was first admitted on 03/01/18.  He was discharged from hospital on 04/06/18    Currently in Pain?  No/denies            Nustep lvl 5 3minutes >70rpm   for cardiovascular challenge. : use of lateral leg support attachment for RLE. 30second rest then completed another 3 minutes. Last 3 minutes lvl 3  Vitals after 3minutes HRl 107bpm O2: 99%  Vitals after exercise.  HR 103bpm 98 O2   PT seated next to patient (on his right side) on mat table: Standing weight shifts with therapist verbal and tactile cues to increase weight shift to right LE, with lateral weight shift side/side to challenge quad control 3 minutes in front of mirror. x3minutes   Standing next to patient on his R  standing marches with LLE. Mild tactile cue to prevent R knee hyperextension which  was faded with duration. x15 marches CGA   Partial squats from standing position with therapist in seated positioned to block R knee hyperextension and prevention of knee past toe. Verbal/tactile cues to facilitate weight shift to R and to push hips posteriorly for proper squat form CGA. Tactile cues for proper alignment of head and shoulders. x15   Seated balloon taps reaching in and out of BOS. Verbal cues for upright posture. x3minutes   Seated hip adduction bilaterally with PT providing resistance, improved quality of movement and muscle activation with repetition, x10. Right arm positioned at patient side  Ambulate outside with CGA negotiating turns and uneven surfaces along sidewalk without LOB; 2 laps. Verbal and tactile cues to facilitate weight shift onto RLE. Verbal cues to increase step length of LLE to increase stance phase of RLE. Tactile cues to facilitate anterior pelvic shift and gluteal activation to increase step length and RLE stance time. Second therapist holding RUE (holding into extension) to improve weight shift and body alignment while ambulating with arm swing during second lap. 1 seated rest break x25 minutes ambulation                   PT Education - 06/07/18 1250    Education Details  weight shift, exercise technique/form, gait    Person(s) Educated  Patient;Other (comment)   aunt   Methods  Explanation;Demonstration;Verbal cues    Comprehension  Verbalized understanding;Returned demonstration       PT Short Term Goals - 05/10/18 1201      PT SHORT TERM GOAL #1   Title  Patient will be independent in home exercise program to improve strength/mobility for better functional independence with ADLs.    Baseline  05/10/18: HEP compliance    Time  6    Period  Weeks    Status  Achieved      PT SHORT TERM GOAL #2   Title  Patient (< 60 years old) will complete five times sit to stand test in < 10 seconds indicating an increased LE strength and  improved balance.    Baseline  15.67 sec 05/10/18: 14seconds    Time  6    Period  Weeks    Status  Partially Met      PT SHORT TERM GOAL #3   Title  Patient will reduce timed up and go to <11 seconds to reduce fall risk and demonstrate improved transfer/gait ability.    Baseline  18.88 sec 05/10/18: 21seconds    Time  6    Period  Weeks    Status  On-going        PT Long Term Goals - 05/10/18 1202      PT LONG TERM GOAL #1   Title  Patient will increase six minute walk test distance to >1000 for progression to community ambulator and   improve gait ability    Baseline  585 feet 05/10/18: 563f    Time  12    Period  Weeks    Status  On-going      PT LONG TERM GOAL #2   Title  Patient will increase 10 meter walk test to >1.059m as to improve gait speed for better community ambulation and to reduce fall risk.    Baseline  .48 m/sec 05/10/18: 0.7115m   Time  12    Period  Weeks    Status  New      PT LONG TERM GOAL #3   Title  Patient will increase Berg Balance score by > 6 points to demonstrate decreased fall risk during functional activities.    Baseline  25/56 05/10/18: 36/56    Time  12    Period  Weeks    Status  Partially Met      PT LONG TERM GOAL #4   Title  Patient will be require no assist with ascend/descend 3 steps using Least restrictive assistive device.    Baseline  needs rail and cues for sequencing and min assist for RLE ascending the step 05/10/18: needs rail and cues for sequencing; heavily relies on UE    Time  12    Period  Weeks    Status  Partially Met            Plan - 06/07/18 1247    Clinical Impression Statement  Patient demonstrated improved sit to stands with increased symmetry and weight bearing through RLE. Patient also required less cueing to achieve improved form and mechanics. Patient ambulated outside around turns with facilitation and frequent cueing for increased weight shift to R to improve gait mechanics. Patient had increased weight  shift while holding therapist hand while ambulating with improved arm swing aiding with quality of gait mechanics. Educated family member on interventions and purpose of intervention in therapy. Patient will continue to benefit from skilled physical therapy to improve gait, balance, and functional mobility.     Rehab Potential  Good    PT Frequency  2x / week    PT Duration  12 weeks    PT Treatment/Interventions  Manual techniques;Patient/family education;Neuromuscular re-education;Balance training;Functional mobility training;Therapeutic activities;Therapeutic exercise;Gait training;Orthotic Fit/Training;Passive range of motion;ADLs/Self Care Home Management;Electrical Stimulation;Stair training;Energy conservation;Taping;DME Instruction    PT Next Visit Plan  strengthening, balance, gait training, uneven surfaces     PT Home Exercise Plan  standing abd bilaterally x 15    Consulted and Agree with Plan of Care  Patient       Patient will benefit from skilled therapeutic intervention in order to improve the following deficits and impairments:  Abnormal gait, Decreased balance, Decreased endurance, Decreased mobility, Difficulty walking, Impaired sensation, Increased muscle spasms, Impaired UE functional use, Impaired flexibility, Decreased strength, Decreased coordination, Decreased activity tolerance  Visit Diagnosis: Muscle weakness (generalized)  Other lack of coordination  Difficulty in walking, not elsewhere classified  Other abnormalities of gait and mobility     Problem List Patient Active Problem List   Diagnosis Date Noted  . CVA (cerebral vascular accident) (HCCFlorham Park9/23/2019  . Neurogenic bowel   . Neurogenic bladder   . Spastic hemiparesis (HCCBriarcliff . 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 Cerebral infarction due to embolism of left middle cerebral artery (HCC) s/p thrombectomy 03/01/2018  . Carotid artery  dissection (HCCBairoileft 03/01/2018  . Acute ischemic right MCA  stroke (HCC) 03/01/2018  . Right hemiparesis (HCC)   . Dysphagia, post-stroke   . Global aphasia   . Leukocytosis   . Folic acid deficiency 02/23/2018  . B12 deficiency 02/23/2018  . Hyperhomocysteinemia (HCC) 02/23/2018  . Smoker     , SPT  This entire session was performed under direct supervision and direction of a licensed therapist/therapist assistant . I have personally read, edited and approve of the note as written.  Marina Moser, PT, DPT   06/07/2018, 2:45 PM  [place] [facility] [address] [place], [place], [address] Phone: [phone]   Fax:  [phone]  Name: Lancelot Eugene Lighty MRN: [id number] Date of Birth: 02/21/1981   

## 2018-06-07 NOTE — Therapy (Signed)
[place] [facility] SERVICES [address] [place], [place], [address] Phone: [phone]   Fax:  [phone]  Speech Language Pathology Treatment  Patient Details  Name: Xavier Villegas MRN: [id number] Date of Birth: 08-Apr-1981 Referring Provider (SLP): Bary Leriche    Encounter Date: 06/07/2018  End of Session - 06/07/18 1252    Visit Number  13    Number of Visits  25    Date for SLP Re-Evaluation  07/13/18    SLP Start Time  0900    SLP Stop Time   0952    SLP Time Calculation (min)  52 min    Activity Tolerance  Patient tolerated treatment well       Past Medical History:  Diagnosis Date  . Aphasia   . GERD (gastroesophageal reflux disease)   . Hemiparesis ([place]) 02/2018   right side  . Hyperhomocysteinemia ([place])   . Neurogenic bladder 02/2018   04/26/18 inporving  . Spastic hemiparesis (HCC)    right side  . Stroke ([place])    Left MCA infart  . Tobacco abuse     Past Surgical History:  Procedure Laterality Date  . CRANIOTOMY Left 02/21/2018   Procedure: DECOMPRESSIVE CRANIECTOMY with bone flap to abdomen;  Surgeon: [name], MD;  Location: [place];  Service: Neurosurgery;  Laterality: Left;  DECOMPRESSIVE CRANIECTOMY with bone flap to abdomen  . CRANIOTOMY N/A 05/02/2018   Procedure: RE-IMPLANTATION OF CRANIAL FLAP;  Surgeon: [name], MD;  Location: [place];  Service: Neurosurgery;  Laterality: N/A;  RE-IMPLANTATION OF CRANIAL FLAP  . IR ANGIO INTRA EXTRACRAN SEL COM CAROTID INNOMINATE UNI R MOD SED  02/19/2018  . IR ANGIO VERTEBRAL SEL VERTEBRAL BILAT MOD SED  02/19/2018  . IR CT HEAD LTD  02/19/2018  . IR PERCUTANEOUS ART THROMBECTOMY/INFUSION INTRACRANIAL INC DIAG ANGIO  02/19/2018  . IR US GUIDE VASC ACCESS RIGHT  02/19/2018  . RADIOLOGY WITH ANESTHESIA N/A 02/19/2018   Procedure: IR WITH ANESTHESIA CODE STROKE;  Surgeon: Corrie Mckusick, DO;  Location: [place];  Service: Anesthesiology;  Laterality: N/A;  . TEE  WITHOUT CARDIOVERSION N/A 03/01/2018   Procedure: TRANSESOPHAGEAL ECHOCARDIOGRAM (TEE);  Surgeon: Sanda Klein, MD;  Location: [facility];  Service: Cardiovascular;  Laterality: N/A;    There were no vitals filed for this visit.  Subjective Assessment - 06/07/18 1250    Subjective  "but" expressing nuance RE: yes/no question            ADULT SLP TREATMENT - 06/07/18 0001      General Information   Behavior/Cognition  Alert;Cooperative;Pleasant mood    HPI   37 year old man with left MCA CVA 02/19/2018.  Patient received inpatient rehab services, including SLP, for 5 weeks.       Treatment Provided   Treatment provided  Cognitive-Linquistic      Pain Assessment   Pain Assessment  No/denies pain      Cognitive-Linquistic Treatment   Treatment focused on  Aphasia;Apraxia    Skilled Treatment  COMPREHENSION:  Answer multi-unit yes no questions with 95% accuracy.  Follow 2-unit body part commands with 80% accuracy.  Identify stated picture (f=10) with 100% accuracy.    Follow 2-unit commands with 70% accuracy and 100% given repetition.  Identify 2 pictures stated serially with 50% accuracy.  READING: Identify letter stated on QWERTY letter board- given visual cue.  WRITING: point to letters in his name given extra time but no cue.  Assessment / Recommendations / Plan   Plan  Continue with current plan of care      Progression Toward Goals   Progression toward goals  Progressing toward goals       SLP Education - 06/07/18 1251    Education Details  multi-modal communication    Person(s) Educated  Patient    Methods  Explanation    Comprehension  Verbalized understanding         SLP Long Term Goals - 06/01/18 0834      SLP LONG TERM GOAL #1   Title  Patient will name objects using multi-modal strategies with 80% accuracy.    Time  8    Period  Weeks    Status  Partially Met    Target Date  07/13/18      SLP LONG TERM GOAL #2   Title  Patient will read aloud  or repeat words, maintaining phonemic accuracy, with 80% accuracy.      Time  8    Period  Weeks    Status  On-going    Target Date  07/13/18      SLP LONG TERM GOAL #3   Title  Patient will complete 3 unit processing tasks with 80% accuracy without the need of repetition of task instructions or significant delays in responding.    Time  8    Period  Weeks    Status  Partially Met    Target Date  07/13/18      SLP LONG TERM GOAL #4   Title  Patient will demonstrate reading comprehension for words and phrases with 80% accuracy.     Time  8    Period  Weeks    Status  On-going    Target Date  07/13/18       Plan - 06/07/18 1252    Clinical Impression Statement  Patient is alert and eager to work on his communication.  He demonstrates improving auditory comprehension for lengthier and more complex verbal information.  He is gaining greater control for tracing and copying words.  He is demonstrating increasing creative non-verbal communication strategies.  He is making minimal improvement in speech due to the severity of verbal apraxia.  Will continue ST with focus on auditory comprehension and reading/spelling.       Patient will benefit from skilled therapeutic intervention in order to improve the following deficits and impairments:   Aphasia  Apraxia    Problem List Patient Active Problem List   Diagnosis Date Noted  . CVA (cerebral vascular accident) ([place]) 05/02/2018  . Neurogenic bowel   . Neurogenic bladder   . Spastic hemiparesis ([place])   . 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 Cerebral infarction due to embolism of left middle cerebral artery (HCC) s/p thrombectomy 03/01/2018  . Carotid artery dissection ([place]) Left 03/01/2018  . Acute ischemic right MCA stroke ([place]) 03/01/2018  . Right hemiparesis ([place])   . Dysphagia, post-stroke   . Global aphasia   . Leukocytosis   . Folic acid deficiency 00/93/8182   . B12 deficiency 02/23/2018  . Hyperhomocysteinemia ([place]) 02/23/2018  . Smoker    Leroy Sea, MS/CCC- SLP  Lou Miner 06/07/2018, 12:52 PM  [place] [facility] SERVICES [address] [place], [place], [address] Phone: [phone]   Fax:  [phone]   Name: Verland Sprinkle MRN: [id number] Date of Birth: [birth year]

## 2018-06-09 ENCOUNTER — Ambulatory Visit: Payer: BLUE CROSS/BLUE SHIELD | Admitting: Occupational Therapy

## 2018-06-09 ENCOUNTER — Ambulatory Visit: Payer: BLUE CROSS/BLUE SHIELD | Admitting: Speech Pathology

## 2018-06-09 ENCOUNTER — Encounter: Payer: Self-pay | Admitting: Occupational Therapy

## 2018-06-09 ENCOUNTER — Ambulatory Visit: Payer: BLUE CROSS/BLUE SHIELD

## 2018-06-09 ENCOUNTER — Encounter: Payer: Self-pay | Admitting: Speech Pathology

## 2018-06-09 DIAGNOSIS — R278 Other lack of coordination: Secondary | ICD-10-CM | POA: Diagnosis not present

## 2018-06-09 DIAGNOSIS — R262 Difficulty in walking, not elsewhere classified: Secondary | ICD-10-CM

## 2018-06-09 DIAGNOSIS — M6281 Muscle weakness (generalized): Secondary | ICD-10-CM

## 2018-06-09 DIAGNOSIS — R2689 Other abnormalities of gait and mobility: Secondary | ICD-10-CM | POA: Diagnosis not present

## 2018-06-09 DIAGNOSIS — M25511 Pain in right shoulder: Secondary | ICD-10-CM | POA: Diagnosis not present

## 2018-06-09 DIAGNOSIS — I63511 Cerebral infarction due to unspecified occlusion or stenosis of right middle cerebral artery: Secondary | ICD-10-CM | POA: Diagnosis not present

## 2018-06-09 DIAGNOSIS — R4701 Aphasia: Secondary | ICD-10-CM

## 2018-06-09 DIAGNOSIS — R482 Apraxia: Secondary | ICD-10-CM

## 2018-06-09 DIAGNOSIS — I69351 Hemiplegia and hemiparesis following cerebral infarction affecting right dominant side: Secondary | ICD-10-CM | POA: Diagnosis not present

## 2018-06-09 NOTE — Therapy (Addendum)
[place] MAIN [facility] SERVICES [address] [place], [place], [address] Phone: [phone]   Fax:  [phone]  Physical Therapy Treatment/ Physical Therapy Progress Note   Dates of reporting period  05/10/18   to   06/09/18   Patient Details  Name: Xavier Villegas MRN: [id number] Date of Birth: 09-06-1980 Referring Provider (PT): Reesa Chew   Encounter Date: 06/09/2018  PT End of Session - 06/09/18 1231    Visit Number  14    Number of Visits  25    Date for PT Re-Evaluation  07/14/18    Authorization Type  PN 1/10 begins 10/31    PT Start Time  1106    PT Stop Time  1154    PT Time Calculation (min)  48 min    Equipment Utilized During Treatment  Gait belt    Activity Tolerance  Patient tolerated treatment well    Behavior During Therapy  WFL for tasks assessed/performed       Past Medical History:  Diagnosis Date  . Aphasia   . GERD (gastroesophageal reflux disease)   . Hemiparesis ([place]) 02/2018   right side  . Hyperhomocysteinemia ([place])   . Neurogenic bladder 02/2018   04/26/18 inporving  . Spastic hemiparesis (HCC)    right side  . Stroke ([place])    Left MCA infart  . Tobacco abuse     Past Surgical History:  Procedure Laterality Date  . CRANIOTOMY Left 02/21/2018   Procedure: DECOMPRESSIVE CRANIECTOMY with bone flap to abdomen;  Surgeon: [name], MD;  Location: [place];  Service: Neurosurgery;  Laterality: Left;  DECOMPRESSIVE CRANIECTOMY with bone flap to abdomen  . CRANIOTOMY N/A 05/02/2018   Procedure: RE-IMPLANTATION OF CRANIAL FLAP;  Surgeon: [name], MD;  Location: [place];  Service: Neurosurgery;  Laterality: N/A;  RE-IMPLANTATION OF CRANIAL FLAP  . IR ANGIO INTRA EXTRACRAN SEL COM CAROTID INNOMINATE UNI R MOD SED  02/19/2018  . IR ANGIO VERTEBRAL SEL VERTEBRAL BILAT MOD SED  02/19/2018  . IR CT HEAD LTD  02/19/2018  . IR PERCUTANEOUS ART THROMBECTOMY/INFUSION INTRACRANIAL INC DIAG ANGIO  02/19/2018  . IR  US GUIDE VASC ACCESS RIGHT  02/19/2018  . RADIOLOGY WITH ANESTHESIA N/A 02/19/2018   Procedure: IR WITH ANESTHESIA CODE STROKE;  Surgeon: Corrie Mckusick, DO;  Location: [place];  Service: Anesthesiology;  Laterality: N/A;  . TEE WITHOUT CARDIOVERSION N/A 03/01/2018   Procedure: TRANSESOPHAGEAL ECHOCARDIOGRAM (TEE);  Surgeon: Sanda Klein, MD;  Location: [facility] ENDOSCOPY;  Service: Cardiovascular;  Laterality: N/A;    There were no vitals filed for this visit.  Subjective Assessment - 06/09/18 1229    Subjective  Patient reports he is doing well. States he would like to try walking without cane. Aunt reports she left him by himself yesterday and he was able to get himself in house.     Patient is accompained by:  Family member    Pertinent History  [name]  had left MCA infarct and subsequent craniectomy.  He was discharged from rehab several weeks ago. He has been home with his family  He is having occasional spasms on the right side still.Patient has Right hemiparesis, dysphagia, and aphasia secondary to large left MCA infarct with hemorrhagic transformation s/p hemicraniectomy.He had acute ischemic left MCA infarct and subsequent craniotomy - was first admitted on 03/01/18.  He was discharged from hospital on 04/06/18    Currently in Pain?  No/denies  [facility] PT Assessment - 06/09/18 0001      Berg Balance Test   Sit to Stand  Able to stand  independently using hands    Standing Unsupported  Able to stand safely 2 minutes    Sitting with Back Unsupported but Feet Supported on Floor or Stool  Able to sit safely and securely 2 minutes    Stand to Sit  Controls descent by using hands    Transfers  Able to transfer safely, definite need of hands    Standing Unsupported with Eyes Closed  Able to stand 10 seconds safely    Standing Ubsupported with Feet Together  Able to place feet together independently and stand for 1 minute with supervision    From Standing, Reach Forward with Outstretched  Arm  Can reach forward >12 cm safely (5")    From Standing Position, Pick up Object from Floor  Able to pick up shoe, needs supervision    From Standing Position, Turn to Look Behind Over each Shoulder  Looks behind one side only/other side shows less weight shift    Turn 360 Degrees  Needs close supervision or verbal cueing    Standing Unsupported, Alternately Place Feet on Step/Stool  Needs assistance to keep from falling or unable to try    Standing Unsupported, One Foot in Front  Able to take small step independently and hold 30 seconds    Standing on One Leg  Able to lift leg independently and hold 5-10 seconds    Total Score  39      PROGRESS NOTE 5xsts-12 seconds with L EU support Attempted without UE support could only complete 2 reps before needing 1 UE assist. Completed in 15seconds. CGA  TUG- 16 seconds with quad cane  53mt- 0.771m  34m21m [id number] with quad cane. RLE circumduction and hip and knee flexion, decreased stance time on RLE, decreased heel contact. Occasionally did not use quad cane to take 1-2 steps.   BERG- 39/56- ability to balance on LLE for approximately 6 seconds  Ascend/descend 3 steps LRAD- required cueing to go down with RLE. Completed reciprocal stepping with RLE during 1 step ascending. RLE toe caught during ascending first step.         Patient's condition has the potential to improve in response to therapy. Maximum improvement is yet to be obtained. The anticipated improvement is attainable and reasonable in a generally predictable time.  Patient reports being less tired after therapy sessions and desire to walk without quad cane.                     PT Education - 06/09/18 1230    Education Details  exercise technique/form, gait, goals, progress    Person(s) Educated  Patient;Other (comment)    Methods  Explanation;Demonstration;Verbal cues    Comprehension  Verbalized understanding;Returned demonstration       PT  Short Term Goals - 06/09/18 1129      PT SHORT TERM GOAL #1   Title  Patient will be independent in home exercise program to improve strength/mobility for better functional independence with ADLs.    Baseline  05/10/18: HEP compliance    Time  6    Period  Weeks    Status  Achieved      PT SHORT TERM GOAL #2   Title  Patient (< 60 y59rs old) will complete five times sit to stand test in < 10 seconds indicating an increased LE strength and improved  balance.    Baseline  15.67 sec 05/10/18: 14seconds 10/31: 12 seconds with LUE assist    Time  6    Period  Weeks    Status  Partially Met      PT SHORT TERM GOAL #3   Title  Patient will reduce timed up and go to <11 seconds to reduce fall risk and demonstrate improved transfer/gait ability.    Baseline  18.88 sec 05/10/18: 21seconds10/31: 16seconds    Time  6    Period  Weeks    Status  On-going        PT Long Term Goals - 06/09/18 1132      PT LONG TERM GOAL #1   Title  Patient will increase six minute walk test distance to >1000 for progression to community ambulator and improve gait ability    Baseline  585 feet 05/10/18: 533f 10/31: 6422f   Time  12    Period  Weeks    Status  On-going      PT LONG TERM GOAL #2   Title  Patient will increase 10 meter walk test to >1.64m45mas to improve gait speed for better community ambulation and to reduce fall risk.    Baseline  .48 m/sec 05/10/18: 0.96m78m0/31: 0.3m/64m Time  12    Period  Weeks    Status  On-going      PT LONG TERM GOAL #3   Title  Patient will increase Berg Balance score by > 6 points to demonstrate decreased fall risk during functional activities.    Baseline  25/56 05/10/18: 36/56 10/31: 39/56    Time  12    Period  Weeks    Status  On-going      PT LONG TERM GOAL #4   Title  Patient will be require no assist with ascend/descend 3 steps using Least restrictive assistive device.    Baseline  needs rail and cues for sequencing and min assist for RLE ascending the  step 05/10/18: needs rail and cues for sequencing; heavily relies on UE 10/31: needs rail and cues for sequencing     Time  12    Period  Weeks    Status  Partially Met            Plan - 06/09/18 1228    Clinical Impression Statement  Patient continues to make progress on all goals. Patient improved BERG balance score from 36/56 to 39/56 improving static balance however is still at and increased fall risk. Patient also improved walking distance during 6mwt 7mh less circumduction and increased hip and knee flexion improving gait mechanics. Patient still requires use of UE during 5x sit to stands and cueing and assist to ascend/descend stairs. Patient's condition has the potential to improve in response to therapy. Maximum improvement is yet to be obtained. The anticipated improvement is attainable and reasonable in a generally predictable time.  Patient will continue to benefit from skilled physical therapy to improve strength, balance, gait, and functional mobility.     Rehab Potential  Good    PT Frequency  2x / week    PT Duration  12 weeks    PT Treatment/Interventions  Manual techniques;Patient/family education;Neuromuscular re-education;Balance training;Functional mobility training;Therapeutic activities;Therapeutic exercise;Gait training;Orthotic Fit/Training;Passive range of motion;ADLs/Self Care Home Management;Electrical Stimulation;Stair training;Energy conservation;Taping;DME Instruction    PT Next Visit Plan  strengthening, balance, gait training, uneven surfaces     PT Home Exercise Plan  standing abd bilaterally x 15  Consulted and Agree with Plan of Care  Patient       Patient will benefit from skilled therapeutic intervention in order to improve the following deficits and impairments:  Abnormal gait, Decreased balance, Decreased endurance, Decreased mobility, Difficulty walking, Impaired sensation, Increased muscle spasms, Impaired UE functional use, Impaired flexibility,  Decreased strength, Decreased coordination, Decreased activity tolerance  Visit Diagnosis: Muscle weakness (generalized)  Other lack of coordination  Difficulty in walking, not elsewhere classified  Other abnormalities of gait and mobility     Problem List Patient Active Problem List   Diagnosis Date Noted  . CVA (cerebral vascular accident) ([place]) 05/02/2018  . Neurogenic bowel   . Neurogenic bladder   . Spastic hemiparesis ([place])   . 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 Cerebral infarction due to embolism of left middle cerebral artery (HCC) s/p thrombectomy 03/01/2018  . Carotid artery dissection ([place]) Left 03/01/2018  . Acute ischemic right MCA stroke ([place]) 03/01/2018  . Right hemiparesis ([place])   . Dysphagia, post-stroke   . Global aphasia   . Leukocytosis   . Folic acid deficiency 03/83/3383  . B12 deficiency 02/23/2018  . Hyperhomocysteinemia ([place]) 02/23/2018  . Smoker    Erick Blinks, SPT  This entire session was performed under direct supervision and direction of a licensed therapist/therapist assistant . I have personally read, edited and approve of the note as written.  Janna Arch, PT, DPT   06/09/2018, 12:33 PM  [place] MAIN [facility] SERVICES [address] [place], [place], [address] Phone: [phone]   Fax:  [phone]  Name: Bear Osten MRN: [id number] Date of Birth: [birth year]

## 2018-06-09 NOTE — Therapy (Signed)
[place] MAIN [facility] SERVICES [address] [place], [place], [address] Phone: [phone]   Fax:  [phone]  Speech Language Pathology Treatment  Patient Details  Name: Xavier Villegas MRN: [id number] Date of Birth: 02/20/1981 Referring Provider (SLP): Bary Leriche    Encounter Date: 06/09/2018  End of Session - 06/09/18 1445    Visit Number  14    Number of Visits  25    Date for SLP Re-Evaluation  07/13/18    SLP Start Time  0900    SLP Stop Time   0955    SLP Time Calculation (min)  55 min    Activity Tolerance  Patient tolerated treatment well       Past Medical History:  Diagnosis Date  . Aphasia   . GERD (gastroesophageal reflux disease)   . Hemiparesis ([place]) 02/2018   right side  . Hyperhomocysteinemia ([place])   . Neurogenic bladder 02/2018   04/26/18 inporving  . Spastic hemiparesis (HCC)    right side  . Stroke ([place])    Left MCA infart  . Tobacco abuse     Past Surgical History:  Procedure Laterality Date  . CRANIOTOMY Left 02/21/2018   Procedure: DECOMPRESSIVE CRANIECTOMY with bone flap to abdomen;  Surgeon: [name], MD;  Location: [place];  Service: Neurosurgery;  Laterality: Left;  DECOMPRESSIVE CRANIECTOMY with bone flap to abdomen  . CRANIOTOMY N/A 05/02/2018   Procedure: RE-IMPLANTATION OF CRANIAL FLAP;  Surgeon: [name], MD;  Location: [place];  Service: Neurosurgery;  Laterality: N/A;  RE-IMPLANTATION OF CRANIAL FLAP  . IR ANGIO INTRA EXTRACRAN SEL COM CAROTID INNOMINATE UNI R MOD SED  02/19/2018  . IR ANGIO VERTEBRAL SEL VERTEBRAL BILAT MOD SED  02/19/2018  . IR CT HEAD LTD  02/19/2018  . IR PERCUTANEOUS ART THROMBECTOMY/INFUSION INTRACRANIAL INC DIAG ANGIO  02/19/2018  . IR US GUIDE VASC ACCESS RIGHT  02/19/2018  . RADIOLOGY WITH ANESTHESIA N/A 02/19/2018   Procedure: IR WITH ANESTHESIA CODE STROKE;  Surgeon: Corrie Mckusick, DO;  Location: [place];  Service: Anesthesiology;  Laterality: N/A;  . TEE  WITHOUT CARDIOVERSION N/A 03/01/2018   Procedure: TRANSESOPHAGEAL ECHOCARDIOGRAM (TEE);  Surgeon: Sanda Klein, MD;  Location: [facility] ENDOSCOPY;  Service: Cardiovascular;  Laterality: N/A;    There were no vitals filed for this visit.  Subjective Assessment - 06/09/18 1445    Subjective  "but" expressing nuance RE: yes/no question            ADULT SLP TREATMENT - 06/09/18 0001      General Information   Behavior/Cognition  Alert;Cooperative;Pleasant mood    HPI   37 year old man with left MCA CVA 02/19/2018.  Patient received inpatient rehab services, including SLP, for 5 weeks.       Treatment Provided   Treatment provided  Cognitive-Linquistic      Pain Assessment   Pain Assessment  No/denies pain      Cognitive-Linquistic Treatment   Treatment focused on  Aphasia;Apraxia    Skilled Treatment  SPEECH:  Imitate 4 words, is not able to repeat X3 while maintaining phonemic accuracy.  Count to 10, in unison, with 80% accuracy.  COMPREHENSION:  Answer multi-unit yes no questions with 95% accuracy.  Identify stated picture (f=15) with 100% accuracy.    READING: Match word to picture with 80% accuracy.   Identify letter stated on QWERTY letter board- given visual cue.  WRITING: point to letters in his name given extra time but  no cue.      Assessment / Recommendations / Plan   Plan  Continue with current plan of care      Progression Toward Goals   Progression toward goals  Progressing toward goals       SLP Education - 06/09/18 1445    Education Details  multi-modal communication    Person(s) Educated  Patient    Methods  Explanation    Comprehension  Verbalized understanding         SLP Long Term Goals - 06/01/18 0834      SLP LONG TERM GOAL #1   Title  Patient will name objects using multi-modal strategies with 80% accuracy.    Time  8    Period  Weeks    Status  Partially Met    Target Date  07/13/18      SLP LONG TERM GOAL #2   Title  Patient will read aloud  or repeat words, maintaining phonemic accuracy, with 80% accuracy.      Time  8    Period  Weeks    Status  On-going    Target Date  07/13/18      SLP LONG TERM GOAL #3   Title  Patient will complete 3 unit processing tasks with 80% accuracy without the need of repetition of task instructions or significant delays in responding.    Time  8    Period  Weeks    Status  Partially Met    Target Date  07/13/18      SLP LONG TERM GOAL #4   Title  Patient will demonstrate reading comprehension for words and phrases with 80% accuracy.     Time  8    Period  Weeks    Status  On-going    Target Date  07/13/18       Plan - 06/09/18 1445    Clinical Impression Statement  Patient is alert and eager to work on his communication.  He demonstrates improving auditory comprehension for lengthier and more complex verbal information.  He is gaining greater control for tracing and copying words.  He is demonstrating increasing creative non-verbal communication strategies.  He is making minimal improvement in speech due to the severity of verbal apraxia.  Will continue ST with focus on auditory comprehension and reading/spelling.    Speech Therapy Frequency  2x / week    Duration  Other (comment)    Treatment/Interventions  Language facilitation;Multimodal communcation approach;Patient/family education;SLP instruction and feedback    Potential to Achieve Goals  Good    Potential Considerations  Ability to learn/carryover information;Pain level;Family/community support;Co-morbidities;Previous level of function;Cooperation/participation level;Severity of impairments;Medical prognosis    Consulted and Agree with Plan of Care  Patient       Patient will benefit from skilled therapeutic intervention in order to improve the following deficits and impairments:   Aphasia  Apraxia    Problem List Patient Active Problem List   Diagnosis Date Noted  . CVA (cerebral vascular accident) ([place]) 05/02/2018  .  Neurogenic bowel   . Neurogenic bladder   . Spastic hemiparesis ([place])   . 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 Cerebral infarction due to embolism of left middle cerebral artery (HCC) s/p thrombectomy 03/01/2018  . Carotid artery dissection ([place]) Left 03/01/2018  . Acute ischemic right MCA stroke ([place]) 03/01/2018  . Right hemiparesis ([place])   . Dysphagia, post-stroke   . Global aphasia   .  Leukocytosis   . Folic acid deficiency 83/23/4688  . B12 deficiency 02/23/2018  . Hyperhomocysteinemia ([place]) 02/23/2018  . Smoker    Leroy Sea, MS/CCC- SLP  Lou Miner 06/09/2018, 2:46 PM  [place] MAIN [facility] SERVICES [address] [place], [place], [address] Phone: [phone]   Fax:  [phone]   Name: Xavier Villegas MRN: [id number] Date of Birth: 1980/08/24
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
## 2018-06-14 ENCOUNTER — Encounter: Payer: Self-pay | Admitting: Speech Pathology

## 2018-06-14 ENCOUNTER — Ambulatory Visit: Payer: BLUE CROSS/BLUE SHIELD | Admitting: Occupational Therapy

## 2018-06-14 ENCOUNTER — Ambulatory Visit: Payer: BLUE CROSS/BLUE SHIELD | Admitting: Speech Pathology

## 2018-06-14 ENCOUNTER — Encounter: Payer: Self-pay | Admitting: Occupational Therapy

## 2018-06-14 ENCOUNTER — Ambulatory Visit: Payer: BLUE CROSS/BLUE SHIELD | Attending: Physical Medicine and Rehabilitation

## 2018-06-14 ENCOUNTER — Ambulatory Visit: Payer: BLUE CROSS/BLUE SHIELD

## 2018-06-14 DIAGNOSIS — R482 Apraxia: Secondary | ICD-10-CM

## 2018-06-14 DIAGNOSIS — R278 Other lack of coordination: Secondary | ICD-10-CM

## 2018-06-14 DIAGNOSIS — R262 Difficulty in walking, not elsewhere classified: Secondary | ICD-10-CM

## 2018-06-14 DIAGNOSIS — R2689 Other abnormalities of gait and mobility: Secondary | ICD-10-CM | POA: Insufficient documentation

## 2018-06-14 DIAGNOSIS — I69351 Hemiplegia and hemiparesis following cerebral infarction affecting right dominant side: Secondary | ICD-10-CM | POA: Diagnosis not present

## 2018-06-14 DIAGNOSIS — R4701 Aphasia: Secondary | ICD-10-CM | POA: Diagnosis not present

## 2018-06-14 DIAGNOSIS — M6281 Muscle weakness (generalized): Secondary | ICD-10-CM | POA: Diagnosis not present

## 2018-06-14 NOTE — Therapy (Addendum)
[place] MAIN [facility] SERVICES [address] [place], [place], [address] Phone: [phone]   Fax:  [phone]  Physical Therapy Treatment  Patient Details  Name: Xavier Villegas MRN: [id number] Date of Birth: 05/19/1981 Referring Provider (PT): Reesa Chew   Encounter Date: 06/14/2018  PT End of Session - 06/14/18 1358    Visit Number  15    Number of Visits  25    Date for PT Re-Evaluation  07/14/18    Authorization Type  PN 2/10 begins 10/31    PT Start Time  1300    PT Stop Time  1345    PT Time Calculation (min)  45 min    Equipment Utilized During Treatment  Gait belt    Activity Tolerance  Patient tolerated treatment well    Behavior During Therapy  [facility] for tasks assessed/performed       Past Medical History:  Diagnosis Date  . Aphasia   . GERD (gastroesophageal reflux disease)   . Hemiparesis ([place]) 02/2018   right side  . Hyperhomocysteinemia ([place])   . Neurogenic bladder 02/2018   04/26/18 inporving  . Spastic hemiparesis (HCC)    right side  . Stroke ([place])    Left MCA infart  . Tobacco abuse     Past Surgical History:  Procedure Laterality Date  . CRANIOTOMY Left 02/21/2018   Procedure: DECOMPRESSIVE CRANIECTOMY with bone flap to abdomen;  Surgeon: Xavier Kos, MD;  Location: [place];  Service: Neurosurgery;  Laterality: Left;  DECOMPRESSIVE CRANIECTOMY with bone flap to abdomen  . CRANIOTOMY N/A 05/02/2018   Procedure: RE-IMPLANTATION OF CRANIAL FLAP;  Surgeon: Xavier Kos, MD;  Location: [place];  Service: Neurosurgery;  Laterality: N/A;  RE-IMPLANTATION OF CRANIAL FLAP  . IR ANGIO INTRA EXTRACRAN SEL COM CAROTID INNOMINATE UNI R MOD SED  02/19/2018  . IR ANGIO VERTEBRAL SEL VERTEBRAL BILAT MOD SED  02/19/2018  . IR CT HEAD LTD  02/19/2018  . IR PERCUTANEOUS ART THROMBECTOMY/INFUSION INTRACRANIAL INC DIAG ANGIO  02/19/2018  . IR US GUIDE VASC ACCESS RIGHT  02/19/2018  . RADIOLOGY WITH ANESTHESIA N/A 02/19/2018   Procedure: IR WITH ANESTHESIA CODE STROKE;  Surgeon: Xavier Mckusick, DO;  Location: [place];  Service: Anesthesiology;  Laterality: N/A;  . TEE WITHOUT CARDIOVERSION N/A 03/01/2018   Procedure: TRANSESOPHAGEAL ECHOCARDIOGRAM (TEE);  Surgeon: Xavier Klein, MD;  Location: [facility] ENDOSCOPY;  Service: Cardiovascular;  Laterality: N/A;    There were no vitals filed for this visit.  Subjective Assessment - 06/14/18 1307    Subjective  Patient walked into the clinic and did not use a chair today. Reports no falls since last visit.     Patient is accompained by:  --    Pertinent History  Xavier Villegas  had left MCA infarct and subsequent craniectomy.  He was discharged from rehab several weeks ago. He has been home with his family  He is having occasional spasms on the right side still.Patient has Right hemiparesis, dysphagia, and aphasia secondary to large left MCA infarct with hemorrhagic transformation s/p hemicraniectomy.He had acute ischemic left MCA infarct and subsequent craniotomy - was first admitted on 03/01/18.  He was discharged from hospital on 04/06/18    Currently in Pain?  No/denies          Nustep lvl 4 50mnutes >70rpm for cardiovascular challenge. : use of lateral leg support attachment for RLE. 30second rest then completed another 3 minutes. Last 3 minutes lvl 4   Vitals after  25mnutes HRl 120bpm O2: 97%     PT seated next to patient (on his right side) on mat table in front of mirror: Standing weight shifts with therapist verbal and tactile cues to increase weight shift to right LE, with lateral weight shift side/side to challenge quad control 3 minutes in front of mirror. x365mutes   Standing next to patient on his R  standing marches with LLE. Mild tactile cue to prevent R knee hyperextension which was faded with duration. 2x15 marches CGA   Partial squats from standing position with therapist in seated positioned to block R knee hyperextension and prevention of knee past toe.  Verbal/tactile cues to facilitate weight shift to R and to push hips posteriorly for proper squat form CGA. Tactile cues for proper alignment of head and shoulders. 2nd set patient held squat for 5 seconds. 2 x15    Standing tapping arrows left, right, up, down on floor to music. X 6 minutes. Tapping up, left, and back with LLE. Tapping right arrow with RLE. Verbal cues to stomp foot on arrow. Improved motor pattern with repetition. CGA. Used quad cane for support; increased ability to create rhythm and coordination with repetition.    Seated balloon taps reaching in and out of BOS. Verbal cues for upright posture. x3m40mtes  Seated hip flexion tapping soccer ball for visual and tactile cues of lifting leg straight up 2x10 with RLE Verbal cues to decrease trunk lean to L.    Seated hip adduction bilaterally with PT providing resistance, improved quality of movement and muscle activation with repetition, x10. Right arm positioned at patient side. Verbal cues to decrease trunk lean to L.   Seated hip abduction with bilateral LE pushing into therapist hands. Improved activation with repetition.                           PT Education - 06/14/18 1358    Education Details  exercise technique/form, gait, sitting mechanics    Person(s) Educated  Patient    Methods  Explanation;Demonstration;Verbal cues    Comprehension  Verbalized understanding;Returned demonstration       PT Short Term Goals - 06/09/18 1129      PT SHORT TERM GOAL #1   Title  Patient will be independent in home exercise program to improve strength/mobility for better functional independence with ADLs.    Baseline  05/10/18: HEP compliance    Time  6    Period  Weeks    Status  Achieved      PT SHORT TERM GOAL #2   Title  Patient (< 60 70ars old) will complete five times sit to stand test in < 10 seconds indicating an increased LE strength and improved balance.    Baseline  15.67 sec 05/10/18: 14seconds  10/31: 12 seconds with LUE assist    Time  6    Period  Weeks    Status  Partially Met      PT SHORT TERM GOAL #3   Title  Patient will reduce timed up and go to <11 seconds to reduce fall risk and demonstrate improved transfer/gait ability.    Baseline  18.88 sec 05/10/18: 21seconds10/31: 16seconds    Time  6    Period  Weeks    Status  On-going        PT Long Term Goals - 06/09/18 1132      PT LONG TERM GOAL #1   Title  Patient will  increase six minute walk test distance to >1000 for progression to community ambulator and improve gait ability    Baseline  585 feet 05/10/18: 520f 10/31: 6449f   Time  12    Period  Weeks    Status  On-going    Target Date  07/14/18      PT LONG TERM GOAL #2   Title  Patient will increase 10 meter walk test to >1.60m86mas to improve gait speed for better community ambulation and to reduce fall risk.    Baseline  .48 m/sec 05/10/18: 0.60m66m0/31: 0.6m/45m Time  12    Period  Weeks    Status  On-going    Target Date  07/14/18      PT LONG TERM GOAL #3   Title  Patient will increase Berg Balance score by > 6 points to demonstrate decreased fall risk during functional activities.    Baseline  25/56 05/10/18: 36/56 10/31: 39/56    Time  12    Period  Weeks    Status  On-going    Target Date  07/14/18      PT LONG TERM GOAL #4   Title  Patient will be require no assist with ascend/descend 3 steps using Least restrictive assistive device.    Baseline  needs rail and cues for sequencing and min assist for RLE ascending the step 05/10/18: needs rail and cues for sequencing; heavily relies on UE 10/31: needs rail and cues for sequencing     Time  12    Period  Weeks    Status  Partially Met    Target Date  07/14/18            Plan - 06/14/18 1357    Clinical Impression Statement  Patient continues to improve squats with equal weight shift on each LE and recognition of LE alignment. Patient requires less cueing throughout session for  correction of sitting and standing mechanics with increased symmetry. Introduced tapping in 4 directions to music which was tolerated well for increased weight bearing of RLE and motor control. Patient demonstrated increased motor control and rhythm with duration of task. Patient will continue to benefit from skilled physical therapy to improve strength, balance, gait, and functional mobility.     Rehab Potential  Good    PT Frequency  2x / week    PT Duration  12 weeks    PT Treatment/Interventions  Manual techniques;Patient/family education;Neuromuscular re-education;Balance training;Functional mobility training;Therapeutic activities;Therapeutic exercise;Gait training;Orthotic Fit/Training;Passive range of motion;ADLs/Self Care Home Management;Electrical Stimulation;Stair training;Energy conservation;Taping;DME Instruction    PT Next Visit Plan  strengthening, balance, gait training, uneven surfaces, ambulate without cane    PT Home Exercise Plan  standing abd bilaterally x 15    Consulted and Agree with Plan of Care  Patient       Patient will benefit from skilled therapeutic intervention in order to improve the following deficits and impairments:  Abnormal gait, Decreased balance, Decreased endurance, Decreased mobility, Difficulty walking, Impaired sensation, Increased muscle spasms, Impaired UE functional use, Impaired flexibility, Decreased strength, Decreased coordination, Decreased activity tolerance  Visit Diagnosis: Muscle weakness (generalized)  Other lack of coordination  Difficulty in walking, not elsewhere classified  Other abnormalities of gait and mobility     Problem List Patient Active Problem List   Diagnosis Date Noted  . CVA (cerebral vascular accident) (HCC) [place]23/2019  . Neurogenic bowel   . Neurogenic bladder   . 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 Cerebral  infarction due to embolism of left middle cerebral artery (HCC) s/p thrombectomy 03/01/2018  . Carotid artery dissection ([place]) Left 03/01/2018  . Acute ischemic right MCA stroke ([place]) 03/01/2018  . Right hemiparesis ([place])   . Dysphagia, post-stroke   . Global aphasia   . Leukocytosis   . Folic acid deficiency 59/47/0761  . B12 deficiency 02/23/2018  . Hyperhomocysteinemia ([place]) 02/23/2018  . Smoker    Erick Blinks, SPT  This entire session was performed under direct supervision and direction of a licensed therapist/therapist assistant . I have personally read, edited and approve of the note as written.  Janna Arch, PT, DPT   06/14/2018, 4:33 PM  [place] MAIN [facility] SERVICES [address] [place], [place], [address] Phone: [phone]   Fax:  [phone]  Name: Nathanial Arrighi MRN: [id number] Date of Birth: Jan 15, 1981
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
## 2018-06-14 NOTE — Therapy (Signed)
[place] MAIN [facility] SERVICES [address] [place], [place], [address] Phone: [phone]   Fax:  [phone]  Speech Language Pathology Treatment  Patient Details  Name: Xavier Villegas MRN: [id number] Date of Birth: 06/23/1981 Referring Provider (SLP): Xavier Villegas    Encounter Date: 06/14/2018  End of Session - 06/14/18 1530    Visit Number  15    Number of Visits  25    Date for SLP Re-Evaluation  07/13/18    SLP Start Time  80    SLP Stop Time   1500    SLP Time Calculation (min)  60 min    Activity Tolerance  Patient tolerated treatment well       Past Medical History:  Diagnosis Date  . Aphasia   . GERD (gastroesophageal reflux disease)   . Hemiparesis ([place]) 02/2018   right side  . Hyperhomocysteinemia ([place])   . Neurogenic bladder 02/2018   04/26/18 inporving  . Spastic hemiparesis (HCC)    right side  . Stroke ([place])    Left MCA infart  . Tobacco abuse     Past Surgical History:  Procedure Laterality Date  . CRANIOTOMY Left 02/21/2018   Procedure: DECOMPRESSIVE CRANIECTOMY with bone flap to abdomen;  Surgeon: [name], MD;  Location: [place];  Service: Neurosurgery;  Laterality: Left;  DECOMPRESSIVE CRANIECTOMY with bone flap to abdomen  . CRANIOTOMY N/A 05/02/2018   Procedure: RE-IMPLANTATION OF CRANIAL FLAP;  Surgeon: [name], MD;  Location: [place];  Service: Neurosurgery;  Laterality: N/A;  RE-IMPLANTATION OF CRANIAL FLAP  . IR ANGIO INTRA EXTRACRAN SEL COM CAROTID INNOMINATE UNI R MOD SED  02/19/2018  . IR ANGIO VERTEBRAL SEL VERTEBRAL BILAT MOD SED  02/19/2018  . IR CT HEAD LTD  02/19/2018  . IR PERCUTANEOUS ART THROMBECTOMY/INFUSION INTRACRANIAL INC DIAG ANGIO  02/19/2018  . IR US GUIDE VASC ACCESS RIGHT  02/19/2018  . RADIOLOGY WITH ANESTHESIA N/A 02/19/2018   Procedure: IR WITH ANESTHESIA CODE STROKE;  Surgeon: Corrie Mckusick, DO;  Location: [place];  Service: Anesthesiology;  Laterality: N/A;  . TEE WITHOUT  CARDIOVERSION N/A 03/01/2018   Procedure: TRANSESOPHAGEAL ECHOCARDIOGRAM (TEE);  Surgeon: Sanda Klein, MD;  Location: [facility] ENDOSCOPY;  Service: Cardiovascular;  Laterality: N/A;    There were no vitals filed for this visit.  Subjective Assessment - 06/14/18 1522    Subjective  Pt shook his head "no" when asked if he could say his name.    Patient is accompained by:  Family member    Currently in Pain?  No/denies    Pain Score  0-No pain            ADULT SLP TREATMENT - 06/14/18 0001      General Information   Behavior/Cognition  Alert;Cooperative;Pleasant mood    HPI   37 year old man with left MCA CVA 02/19/2018.  Patient received inpatient rehab services, including SLP, for 5 weeks.       Treatment Provided   Treatment provided  Cognitive-Linquistic      Pain Assessment   Pain Assessment  No/denies pain      Cognitive-Linquistic Treatment   Treatment focused on  Aphasia;Apraxia    Skilled Treatment  SPEECH:  Pt was able to sing Abc's in unison with about 75% accuracy. Attempted singing "How are you," "I'm fine." and "I am Xavier Villegas." Pt was able to sing these functional statement in unison, but struggled to do it independently. He may benefit  from further work with [employer] intonation. COMPREHENSION:  Answer multi-unit yes no questions with 80% accuracy.  Identify stated picture (f=10) with 80% accuracy.   Followed one-two step commands with 70% accuracy. Repetition and cues needed for commands. The pt is very adept at reading non-verbal cues from the therapist to guess the meaning during commands.  READING:  Identify letter stated on QWERTY letter board- given visual cue with 70% accuracy.  WRITING: Pointed to letters in his first name given extra time but no cue; the pt required cues for his last name.       Assessment / Recommendations / Plan   Plan  Continue with current plan of care      Progression Toward Goals   Progression toward goals  Progressing toward goals        SLP Education - 06/14/18 1529    Education Details  Increasing verbal output and using singing (melodic intontation) to aid inverbal output    Person(s) Educated  Patient    Methods  Explanation    Comprehension  Verbalized understanding         SLP Long Term Goals - 06/01/18 1324      SLP LONG TERM GOAL #1   Title  Patient will name objects using multi-modal strategies with 80% accuracy.    Time  8    Period  Weeks    Status  Partially Met    Target Date  07/13/18      SLP LONG TERM GOAL #2   Title  Patient will read aloud or repeat words, maintaining phonemic accuracy, with 80% accuracy.      Time  8    Period  Weeks    Status  On-going    Target Date  07/13/18      SLP LONG TERM GOAL #3   Title  Patient will complete 3 unit processing tasks with 80% accuracy without the need of repetition of task instructions or significant delays in responding.    Time  8    Period  Weeks    Status  Partially Met    Target Date  07/13/18      SLP LONG TERM GOAL #4   Title  Patient will demonstrate reading comprehension for words and phrases with 80% accuracy.     Time  8    Period  Weeks    Status  On-going    Target Date  07/13/18       Plan - 06/14/18 1531    Clinical Impression Statement  Patient is alert and eager to work on his communication.  He demonstrates improving auditory comprehension for lengthier and more complex verbal information.  He is gaining greater control for tracing and copying words.  He is demonstrating increasing creative non-verbal communication strategies.  He is making minimal improvement in speech due to the severity of verbal apraxia.  Will continue ST with focus on auditory comprehension and reading/spelling.    Speech Therapy Frequency  2x / week    Duration  Other (comment)    Treatment/Interventions  Language facilitation;Multimodal communcation approach;Patient/family education;SLP instruction and feedback    Potential to Achieve Goals  Good     Potential Considerations  Ability to learn/carryover information;Pain level;Family/community support;Co-morbidities;Previous level of function;Cooperation/participation level;Severity of impairments;Medical prognosis    Consulted and Agree with Plan of Care  Patient       Patient will benefit from skilled therapeutic intervention in order to improve the following deficits and impairments:   Aphasia  Apraxia    Problem List Patient Active Problem List   Diagnosis Date Noted  . CVA (cerebral vascular accident) ([place]) 05/02/2018  . Neurogenic bowel   . Neurogenic bladder   . Spastic hemiparesis ([place])   . 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 Cerebral infarction due to embolism of left middle cerebral artery (HCC) s/p thrombectomy 03/01/2018  . Carotid artery dissection ([place]) Left 03/01/2018  . Acute ischemic right MCA stroke ([place]) 03/01/2018  . Right hemiparesis ([place])   . Dysphagia, post-stroke   . Global aphasia   . Leukocytosis   . Folic acid deficiency 54/88/4573  . B12 deficiency 02/23/2018  . Hyperhomocysteinemia ([place]) 02/23/2018  . Smoker    Charlean Sanfilippo, [place], [place]  Speech-Language Pathologist   Success 06/14/2018, 3:32 PM  [place] MAIN [facility] SERVICES [address] [place], [place], [address] Phone: [phone]   Fax:  [phone]   Name: Xavier Villegas MRN: [id number] Date of Birth: 11/25/1980

## 2018-06-15 ENCOUNTER — Encounter: Payer: Self-pay | Admitting: Physical Medicine & Rehabilitation

## 2018-06-15 ENCOUNTER — Encounter
Payer: BLUE CROSS/BLUE SHIELD | Attending: Physical Medicine & Rehabilitation | Admitting: Physical Medicine & Rehabilitation

## 2018-06-15 VITALS — BP 132/81 | HR 86 | Ht 66.0 in | Wt 205.0 lb

## 2018-06-15 DIAGNOSIS — Z9889 Other specified postprocedural states: Secondary | ICD-10-CM | POA: Diagnosis not present

## 2018-06-15 DIAGNOSIS — E539 Vitamin B deficiency, unspecified: Secondary | ICD-10-CM | POA: Diagnosis not present

## 2018-06-15 DIAGNOSIS — I69391 Dysphagia following cerebral infarction: Secondary | ICD-10-CM | POA: Diagnosis not present

## 2018-06-15 DIAGNOSIS — N319 Neuromuscular dysfunction of bladder, unspecified: Secondary | ICD-10-CM | POA: Insufficient documentation

## 2018-06-15 DIAGNOSIS — Z87891 Personal history of nicotine dependence: Secondary | ICD-10-CM | POA: Insufficient documentation

## 2018-06-15 DIAGNOSIS — K592 Neurogenic bowel, not elsewhere classified: Secondary | ICD-10-CM | POA: Insufficient documentation

## 2018-06-15 DIAGNOSIS — I69351 Hemiplegia and hemiparesis following cerebral infarction affecting right dominant side: Secondary | ICD-10-CM | POA: Insufficient documentation

## 2018-06-15 DIAGNOSIS — R4701 Aphasia: Secondary | ICD-10-CM | POA: Diagnosis not present

## 2018-06-15 DIAGNOSIS — I63511 Cerebral infarction due to unspecified occlusion or stenosis of right middle cerebral artery: Secondary | ICD-10-CM

## 2018-06-15 DIAGNOSIS — G8191 Hemiplegia, unspecified affecting right dominant side: Secondary | ICD-10-CM

## 2018-06-15 DIAGNOSIS — I69320 Aphasia following cerebral infarction: Secondary | ICD-10-CM | POA: Diagnosis not present

## 2018-06-15 DIAGNOSIS — G8110 Spastic hemiplegia affecting unspecified side: Secondary | ICD-10-CM

## 2018-06-15 MED ORDER — BACLOFEN 10 MG PO TABS: 10.0000 mg | ORAL_TABLET | Freq: Four times a day (QID) | ORAL | 3 refills | Status: DC

## 2018-06-15 NOTE — Progress Notes (Signed)
Subjective:    Patient ID: Xavier Villegas, male    DOB: [birth year], 37 y.o.   MRN: [id number]  HPI   Xavier Villegas is here in follow up of his left MCA infarct. He has been involved in outpt therapies at [facility] for PT, OT, SLP.  Family feels that he has plateaued to a certain extent some has had to meet with the therapist on a few occasions about the goals of his care and treatment.  Spasticity remains an issue as well as his gait and use of the right upper extremity.  I have prescribed baclofen 10 mg 4 times daily but for some reason the only giving him 10 mg daily.  Apparently there was no refill at the pharmacy either.  Patient denies pain.  Appetite has been good.  Sleep is sometimes interrupted.  Patient states it is because he needs to go to the bathroom multiple times at night.  Admittedly he is drinking [facility] frequently even in the evening prior to going to bed.     Pain Inventory Average Pain 0 Pain Right Now 0 My pain is no pain  In the last 24 hours, has pain interfered with the following? General activity 0 Relation with others 0 Enjoyment of life 0 What TIME of day is your pain at its worst? no pain Sleep (in general) Fair  Pain is worse with: no pain Pain improves with: no pain Relief from Meds: no pain  Mobility walk with assistance use a cane  Function disabled: date disabled .  Neuro/Psych bladder control problems trouble walking  Prior Studies Any changes since last visit?  no  Physicians involved in your care Any changes since last visit?  no   Family History  Problem Relation Age of Onset  . Stroke Maternal Grandmother   . Liver cancer Maternal Grandmother   . Stroke Maternal Grandfather   . Leukemia Mother        CLL/Dr. Leretha Pol at [facility]  . Diabetes Father   . High blood pressure Father   . Liver cancer Paternal Grandmother   . Cerebral aneurysm Paternal Grandfather   . Stroke Paternal Grandfather    Social History    Socioeconomic History  . Marital status: Divorced    Spouse name: Not on file  . Number of children: Not on file  . Years of education: Not on file  . Highest education level: Not on file  Occupational History  . Not on file  Social Needs  . Financial resource strain: Not on file  . Food insecurity:    Worry: Not on file    Inability: Not on file  . Transportation needs:    Medical: Not on file    Non-medical: Not on file  Tobacco Use  . Smoking status: Former Smoker    Packs/day: 0.50    Years: 8.00    Pack years: 4.00    Types: Cigarettes    Last attempt to quit: 02/19/2018    Years since quitting: 0.3  . Smokeless tobacco: Never Used  Substance and Sexual Activity  . Alcohol use: Not Currently    Alcohol/week: 14.0 standard drinks    Types: 14 Cans of beer per week  . Drug use: Never  . Sexual activity: Yes  Lifestyle  . Physical activity:    Days per week: Not on file    Minutes per session: Not on file  . Stress: Not on file  Relationships  . Social connections:  Talks on phone: Not on file    Gets together: Not on file    Attends religious service: Not on file    Active member of club or organization: Not on file    Attends meetings of clubs or organizations: Not on file    Relationship status: Not on file  Other Topics Concern  . Not on file  Social History Narrative  . Not on file   Past Surgical History:  Procedure Laterality Date  . CRANIOTOMY Left 02/21/2018   Procedure: DECOMPRESSIVE CRANIECTOMY with bone flap to abdomen;  Surgeon: [name], MD;  Location: [place];  Service: Neurosurgery;  Laterality: Left;  DECOMPRESSIVE CRANIECTOMY with bone flap to abdomen  . CRANIOTOMY N/A 05/02/2018   Procedure: RE-IMPLANTATION OF CRANIAL FLAP;  Surgeon: [name], MD;  Location: [place];  Service: Neurosurgery;  Laterality: N/A;  RE-IMPLANTATION OF CRANIAL FLAP  . IR ANGIO INTRA EXTRACRAN SEL COM CAROTID INNOMINATE UNI R MOD SED  02/19/2018  . IR ANGIO  VERTEBRAL SEL VERTEBRAL BILAT MOD SED  02/19/2018  . IR CT HEAD LTD  02/19/2018  . IR PERCUTANEOUS ART THROMBECTOMY/INFUSION INTRACRANIAL INC DIAG ANGIO  02/19/2018  . IR US GUIDE VASC ACCESS RIGHT  02/19/2018  . RADIOLOGY WITH ANESTHESIA N/A 02/19/2018   Procedure: IR WITH ANESTHESIA CODE STROKE;  Surgeon: Corrie Mckusick, DO;  Location: [place];  Service: Anesthesiology;  Laterality: N/A;  . TEE WITHOUT CARDIOVERSION N/A 03/01/2018   Procedure: TRANSESOPHAGEAL ECHOCARDIOGRAM (TEE);  Surgeon: Sanda Klein, MD;  Location: [facility] ENDOSCOPY;  Service: Cardiovascular;  Laterality: N/A;   Past Medical History:  Diagnosis Date  . Aphasia   . GERD (gastroesophageal reflux disease)   . Hemiparesis ([place]) 02/2018   right side  . Hyperhomocysteinemia ([place])   . Neurogenic bladder 02/2018   04/26/18 inporving  . Spastic hemiparesis (HCC)    right side  . Stroke ([place])    Left MCA infart  . Tobacco abuse    BP 132/81 (BP Location: Left Arm, Patient Position: Sitting, Cuff Size: Normal)   Pulse 86   Ht 5\' 6"  (1.676 m)   Wt 205 lb (93 kg)   SpO2 98%   BMI 33.09 kg/m   Opioid Risk Score:   Fall Risk Score:  `1  Depression screen PHQ 2/9  Depression screen PHQ 2/9 04/18/2018  Decreased Interest 0  Down, Depressed, Hopeless 0  PHQ - 2 Score 0  , Review of Systems  Constitutional: Negative.   HENT: Negative.   Eyes: Negative.   Respiratory: Negative.   Cardiovascular: Negative.   Gastrointestinal: Negative.   Endocrine: Negative.   Genitourinary: Positive for frequency and urgency.  Musculoskeletal: Positive for gait problem.  Skin: Negative.   Allergic/Immunologic: Negative.   Hematological: Negative.   Psychiatric/Behavioral: Negative.   All other systems reviewed and are negative.      Objective:   Physical Exam General: No acute distress HEENT: EOMI, oral membranes moist Cards: reg rate  Chest: normal effort Abdomen: Soft, NT, ND Skin: Scalp wound well-healed Extremities: no  edema   Neuro: Patient remains expressively aphasic.  He does produce words on occasion especially with spontaneous responses..  He stood for me today and tended to be quite stable.  He did go into recurvatum at the right knee when walking however.  Right AFO appears to be fitting appropriately.. Sensory exam is normal. Reflexes are 2+ on the left and 3+ on the right..  Motor function is grossly 5/5 LUE, trace to 1 RUE,  1-2 out of 5 RLE HF, to trace/absent distally.   Resting tone is 2 out of 4 right upper extremity elbow and finger/wrist flexors as well as packs.  Right heel cord is very tight and tone 2-3 out of 4.  He tends to walk without heel strike.  He is able to accommodate somewhat better while wearing his tennis shoes which elevate his heel. Musculoskeletal: Full ROM, No pain with AROM or PROM in the neck, trunk, or extremities. Posture appropriate Psych: Pt's affect is appropriate. Pt is cooperative        Assessment & Plan:  Medical Problem List and Plan: 1. Right hemiparesis, dysphagia, and aphasia secondary to large left MCA infarct with hemorrhagic transformation s/p hemicraniectomy. -Might be best off going to [facility] neuro rehab for further therapies to address spasticity and functional gait as well as use of right upper extremity 2. Spasticity: fairly well controlled  -resume baclofen 10mg  QID -continue splint/ROM  -May be Botox candidate. 4. High homocysteine levels/low folate levels/boderline low B12: Continue high dose folic supplement, B6 and received B12 inj 7/14 . MRHFR--heterozygous /single mutation ID.               -hematologist follow up might be required.  I recommend that he discuss with neurology first. 5 . Neurogenic bladder/bowel: improved -incontinence bladder improved -Discussed the use and consumption of sugared soft drinks especially soft drinks with excessive caffeine. 6. Cranioplasty:              -Patient has done well since cranioplasty.  Wound is well-healed.   Follow up with me in 2 MONTHS.   15 minutes of face to face patient care time were spent during this visit. All questions were encouraged and answered.

## 2018-06-15 NOTE — Patient Instructions (Addendum)
PLEASE FEEL FREE TO CALL OUR OFFICE WITH ANY PROBLEMS OR QUESTIONS ([phone])   REALLY WORK ON STRETCHING OUT YOUR ACHILLES TENDON.   NEED TO TAKE BACLOFEN 4 X DAY.    LINGRAPHICA

## 2018-06-16 ENCOUNTER — Encounter: Payer: Self-pay | Admitting: Occupational Therapy

## 2018-06-16 ENCOUNTER — Encounter: Payer: BLUE CROSS/BLUE SHIELD | Admitting: Speech Pathology

## 2018-06-16 ENCOUNTER — Ambulatory Visit: Payer: BLUE CROSS/BLUE SHIELD | Admitting: Speech Pathology

## 2018-06-16 ENCOUNTER — Encounter: Payer: Self-pay | Admitting: Speech Pathology

## 2018-06-16 ENCOUNTER — Ambulatory Visit: Payer: BLUE CROSS/BLUE SHIELD

## 2018-06-16 ENCOUNTER — Ambulatory Visit: Payer: BLUE CROSS/BLUE SHIELD | Admitting: Occupational Therapy

## 2018-06-16 DIAGNOSIS — M6281 Muscle weakness (generalized): Secondary | ICD-10-CM

## 2018-06-16 DIAGNOSIS — R482 Apraxia: Secondary | ICD-10-CM

## 2018-06-16 DIAGNOSIS — R2689 Other abnormalities of gait and mobility: Secondary | ICD-10-CM

## 2018-06-16 DIAGNOSIS — R262 Difficulty in walking, not elsewhere classified: Secondary | ICD-10-CM

## 2018-06-16 DIAGNOSIS — R278 Other lack of coordination: Secondary | ICD-10-CM

## 2018-06-16 DIAGNOSIS — R4701 Aphasia: Secondary | ICD-10-CM | POA: Diagnosis not present

## 2018-06-16 DIAGNOSIS — I69351 Hemiplegia and hemiparesis following cerebral infarction affecting right dominant side: Secondary | ICD-10-CM | POA: Diagnosis not present

## 2018-06-16 NOTE — Therapy (Addendum)
[place] MAIN [facility] SERVICES [address] [place], [place], [address] Phone: [phone]   Fax:  [phone]  Physical Therapy Treatment  Patient Details  Name: Xavier Villegas MRN: [id number] Date of Birth: [birth year] Referring Provider (PT): Reesa Chew   Encounter Date: 06/16/2018  PT End of Session - 06/16/18 1617    Visit Number  16    Number of Visits  25    Date for PT Re-Evaluation  07/14/18    Authorization Type  PN 3/10 begins 10/31    PT Start Time  1516    PT Stop Time  1600    PT Time Calculation (min)  44 min    Equipment Utilized During Treatment  Gait belt    Activity Tolerance  Patient tolerated treatment well    Behavior During Therapy  [facility] for tasks assessed/performed       Past Medical History:  Diagnosis Date  . Aphasia   . GERD (gastroesophageal reflux disease)   . Hemiparesis ([place]) 02/2018   right side  . Hyperhomocysteinemia ([place])   . Neurogenic bladder 02/2018   04/26/18 inporving  . Spastic hemiparesis (H[facility])    right side  . Stroke ([place])    Left MCA infart  . Tobacco abuse     Past Surgical History:  Procedure Laterality Date  . CRANIOTOMY Left 02/21/2018   Procedure: DECOMPRESSIVE CRANIECTOMY with bone flap to abdomen;  Surgeon: [name], MD;  Location: [facility];  Service: Neurosurgery;  Laterality: Left;  DECOMPRESSIVE CRANIECTOMY with bone flap to abdomen  . CRANIOTOMY N/A 05/02/2018   Procedure: RE-IMPLANTATION OF CRANIAL FLAP;  Surgeon: [name], MD;  Location: [place];  Service: Neurosurgery;  Laterality: N/A;  RE-IMPLANTATION OF CRANIAL FLAP  . IR ANGIO INTRA EXTRACRAN SEL COM CAROTID INNOMINATE UNI R MOD SED  02/19/2018  . IR ANGIO VERTEBRAL SEL VERTEBRAL BILAT MOD SED  02/19/2018  . IR CT HEAD LTD  02/19/2018  . IR PERCUTANEOUS ART THROMBECTOMY/INFUSION INTRACRANIAL INC DIAG ANGIO  02/19/2018  . IR US GUIDE VASC ACCESS RIGHT  02/19/2018  . RADIOLOGY WITH ANESTHESIA N/A 02/19/2018   Procedure: IR WITH ANESTHESIA CODE STROKE;  Surgeon: Corrie Mckusick, DO;  Location: [place];  Service: Anesthesiology;  Laterality: N/A;  . TEE WITHOUT CARDIOVERSION N/A 03/01/2018   Procedure: TRANSESOPHAGEAL ECHOCARDIOGRAM (TEE);  Surgeon: Sanda Klein, MD;  Location: [facility] ENDOSCOPY;  Service: Cardiovascular;  Laterality: N/A;    There were no vitals filed for this visit.  Subjective Assessment - 06/16/18 1525    Subjective  Patient and caregiver report doctors appointment went well yesterday. States they have been practicing sitting and standing "pretty" and stretching achilles tendon. Reports no falls or LOB since last visit.     Pertinent History  Xavier Villegas  had left MCA infarct and subsequent craniectomy.  He was discharged from rehab several weeks ago. He has been home with his family  He is having occasional spasms on the right side still.Patient has Right hemiparesis, dysphagia, and aphasia secondary to large left MCA infarct with hemorrhagic transformation s/p hemicraniectomy.He had acute ischemic left MCA infarct and subsequent craniotomy - was first admitted on 03/01/18.  He was discharged from hospital on 04/06/18    Currently in Pain?  No/denies             Nustep lvl 5 45mnutes >70rpm for cardiovascular challenge. : use of lateral leg support attachment for RLE. 30second rest then completed another 3 minutes. Last 3 minutes  lvl 4   Vitals after 12mnutes HR 109bpm O2: 98%    In //bars:  Modified tandem stance with each leg shifting weight anteriorly/posteriorly to promote glute activation in front of mirror. Tactile cueing at ASIS and PSIS for appropriate anterior weight shift. 1 UE support CGA  -progressed to stepping once appropriate anterior shift achieved. Tactile cueing at ASIS and PSIS for weight shift. Verbal cues to lift knee straight to ceiling and land with RLE heel x5 lengths of bars  Completed above activity without UE support. x3 lengths of bars CGA  Seated  stretching of achilles tendon RLE knee extended 2x 60 seconds. Patient instructed on how to complete stretch achilles at home with sheet.  Seated rocker board with both LE DF/PF. LUE placed on RLE to prevent clonus. x20    Don/doff AFO- patient independently doff AFO, requires assistance to don AFO.                   PT Education - 06/16/18 1618    Education Details  exercise technique/form, gait, sitting/standing mechanics, stretching    Person(s) Educated  Patient;Caregiver(s)    Methods  Explanation;Demonstration;Verbal cues    Comprehension  Verbalized understanding;Returned demonstration       PT Short Term Goals - 06/09/18 1129      PT SHORT TERM GOAL #1   Title  Patient will be independent in home exercise program to improve strength/mobility for better functional independence with ADLs.    Baseline  05/10/18: HEP compliance    Time  6    Period  Weeks    Status  Achieved      PT SHORT TERM GOAL #2   Title  Patient (< 620years old) will complete five times sit to stand test in < 10 seconds indicating an increased LE strength and improved balance.    Baseline  15.67 sec 05/10/18: 14seconds 10/31: 12 seconds with LUE assist    Time  6    Period  Weeks    Status  Partially Met      PT SHORT TERM GOAL #3   Title  Patient will reduce timed up and go to <11 seconds to reduce fall risk and demonstrate improved transfer/gait ability.    Baseline  18.88 sec 05/10/18: 21seconds10/31: 16seconds    Time  6    Period  Weeks    Status  On-going        PT Long Term Goals - 06/09/18 1132      PT LONG TERM GOAL #1   Title  Patient will increase six minute walk test distance to >1000 for progression to community ambulator and improve gait ability    Baseline  585 feet 05/10/18: [id number]: [id number]  Time  12    Period  Weeks    Status  On-going    Target Date  07/14/18      PT LONG TERM GOAL #2   Title  Patient will increase 10 meter walk test to >1.22m/29ms to  improve gait speed for better community ambulation and to reduce fall risk.    Baseline  .48 m/sec 05/10/18: 0.36m/5m/31: 0.12m/s74mTime  12    Period  Weeks    Status  On-going    Target Date  07/14/18      PT LONG TERM GOAL #3   Title  Patient will increase Berg Balance score by > 6 points to demonstrate decreased fall risk during functional  activities.    Baseline  25/56 05/10/18: 36/56 10/31: 39/56    Time  12    Period  Weeks    Status  On-going    Target Date  07/14/18      PT LONG TERM GOAL #4   Title  Patient will be require no assist with ascend/descend 3 steps using Least restrictive assistive device.    Baseline  needs rail and cues for sequencing and min assist for RLE ascending the step 05/10/18: needs rail and cues for sequencing; heavily relies on UE 10/31: needs rail and cues for sequencing     Time  12    Period  Weeks    Status  Partially Met    Target Date  07/14/18            Plan - 06/16/18 1617    Clinical Impression Statement  Patient completed anterior weight shifts in //bars to improve weight shift onto RLE and improved gait mechanics. Patient ambulated with improved weight shift onto RLE and increased symmetrical step pattern with 1 UE assist following modified tandem stance. Patient requires significant cueing for technique to achieve appropriate gait mechanics and muscle activation. Patient ambulated without UE support for first time in session today and will continue to decrease need of AD in future sessions. Instructed patient on how to independently stretch achilles to maintain RLE ROM. Patient will continue to benefit from skilled physical therapy to improve strength, balance, gait and functional mobility.     Rehab Potential  Good    PT Frequency  2x / week    PT Duration  12 weeks    PT Treatment/Interventions  Manual techniques;Patient/family education;Neuromuscular re-education;Balance training;Functional mobility training;Therapeutic  activities;Therapeutic exercise;Gait training;Orthotic Fit/Training;Passive range of motion;ADLs/Self Care Home Management;Electrical Stimulation;Stair training;Energy conservation;Taping;DME Instruction    PT Next Visit Plan  strengthening, balance, gait training, uneven surfaces, ambulate without cane    PT Home Exercise Plan  standing abd bilaterally x 15    Consulted and Agree with Plan of Care  Patient       Patient will benefit from skilled therapeutic intervention in order to improve the following deficits and impairments:  Abnormal gait, Decreased balance, Decreased endurance, Decreased mobility, Difficulty walking, Impaired sensation, Increased muscle spasms, Impaired UE functional use, Impaired flexibility, Decreased strength, Decreased coordination, Decreased activity tolerance  Visit Diagnosis: Muscle weakness (generalized)  Other lack of coordination  Difficulty in walking, not elsewhere classified  Other abnormalities of gait and mobility     Problem List Patient Active Problem List   Diagnosis Date Noted  . CVA (cerebral vascular accident) ([place]) 05/02/2018  . Neurogenic bowel   . Neurogenic bladder   . Spastic hemiparesis ([place])   . 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 Cerebral infarction due to embolism of left middle cerebral artery (HCC) s/p thrombectomy 03/01/2018  . Carotid artery dissection ([place]) Left 03/01/2018  . Acute ischemic right MCA stroke ([place]) 03/01/2018  . Right hemiparesis ([place])   . Dysphagia, post-stroke   . Global aphasia   . Leukocytosis   . Folic acid deficiency 01/31/7627  . B12 deficiency 02/23/2018  . Hyperhomocysteinemia ([place]) 02/23/2018  . Smoker    Erick Blinks, SPT  This entire session was performed under direct supervision and direction of a licensed therapist/therapist assistant . I have personally read, edited and approve of the note as written.  Janna Arch, PT,  DPT   06/17/2018, 11:20 AM  [place]  [facility] MAIN [facility] SERVICES [place], [place], [address] Phone: [phone]   Fax:  [phone]  Name: Xavier Villegas MRN: [id number] Date of Birth: [birth year]

## 2018-06-16 NOTE — Therapy (Signed)
[place] MAIN [facility] SERVICES [address] [place], [place], [address] Phone: [phone]   Fax:  [phone]  Speech Language Pathology Treatment  Patient Details  Name: Xavier Villegas MRN: [id number] Date of Birth: 1981-03-31 Referring Provider (SLP): Bary Leriche    Encounter Date: 06/16/2018  End of Session - 06/16/18 1623    Visit Number  16    Number of Visits  25    Date for SLP Re-Evaluation  07/13/18    SLP Start Time  1400    SLP Stop Time   1450    SLP Time Calculation (min)  50 min    Activity Tolerance  Patient tolerated treatment well       Past Medical History:  Diagnosis Date  . Aphasia   . GERD (gastroesophageal reflux disease)   . Hemiparesis ([place]) 02/2018   right side  . Hyperhomocysteinemia ([place])   . Neurogenic bladder 02/2018   04/26/18 inporving  . Spastic hemiparesis ([facility])    right side  . Stroke ([place])    Left MCA infart  . Tobacco abuse     Past Surgical History:  Procedure Laterality Date  . CRANIOTOMY Left 02/21/2018   Procedure: DECOMPRESSIVE CRANIECTOMY with bone flap to abdomen;  Surgeon: [name], MD;  Location: [place];  Service: Neurosurgery;  Laterality: Left;  DECOMPRESSIVE CRANIECTOMY with bone flap to abdomen  . CRANIOTOMY N/A 05/02/2018   Procedure: RE-IMPLANTATION OF CRANIAL FLAP;  Surgeon: [name], MD;  Location: [place];  Service: Neurosurgery;  Laterality: N/A;  RE-IMPLANTATION OF CRANIAL FLAP  . IR ANGIO INTRA EXTRACRAN SEL COM CAROTID INNOMINATE UNI R MOD SED  02/19/2018  . IR ANGIO VERTEBRAL SEL VERTEBRAL BILAT MOD SED  02/19/2018  . IR CT HEAD LTD  02/19/2018  . IR PERCUTANEOUS ART THROMBECTOMY/INFUSION INTRACRANIAL INC DIAG ANGIO  02/19/2018  . IR US GUIDE VASC ACCESS RIGHT  02/19/2018  . RADIOLOGY WITH ANESTHESIA N/A 02/19/2018   Procedure: IR WITH ANESTHESIA CODE STROKE;  Surgeon: Corrie Mckusick, DO;  Location: [place];  Service: Anesthesiology;  Laterality: N/A;  . TEE WITHOUT  CARDIOVERSION N/A 03/01/2018   Procedure: TRANSESOPHAGEAL ECHOCARDIOGRAM (TEE);  Surgeon: Sanda Klein, MD;  Location: [facility] ENDOSCOPY;  Service: Cardiovascular;  Laterality: N/A;    There were no vitals filed for this visit.  Subjective Assessment - 06/16/18 1621    Subjective  Pt shook his head "no" when asked if he could say his name.            ADULT SLP TREATMENT - 06/16/18 0001      General Information   Behavior/Cognition  Alert;Cooperative;Pleasant mood    HPI   37 year old man with left MCA CVA 02/19/2018.  Patient received inpatient rehab services, including SLP, for 5 weeks.       Treatment Provided   Treatment provided  Cognitive-Linquistic      Pain Assessment   Pain Assessment  No/denies pain      Cognitive-Linquistic Treatment   Treatment focused on  Aphasia;Apraxia    Skilled Treatment  SPEECH:  Imitate 5 words, is not able to repeat X3 while maintaining phonemic accuracy.  Approximate 6 words- typically the initial phoneme is incorrect with the remainder of the word correct.  Count to 10, in unison, with 90% accuracy.  Generate accurate word in phrase completion task with 40% accuracy and close approximation with 45% accuracy.  COMPREHENSION:  Answer multi-unit yes no questions with 95% accuracy.  Identify  stated picture (f=15) with 100% accuracy.    READING: Identify 26 letters (given visual cue) on QWERTY letter board- 4' 16" .  WRITING: point to letters in his name given extra time and one cue.      Assessment / Recommendations / Plan   Plan  Continue with current plan of care      Progression Toward Goals   Progression toward goals  Progressing toward goals       SLP Education - 06/16/18 1622    Education Details  Increase attemtps to talk    Person(s) Educated  Patient    Methods  Explanation    Comprehension  Verbalized understanding         SLP Long Term Goals - 06/01/18 0834      SLP LONG TERM GOAL #1   Title  Patient will name objects using  multi-modal strategies with 80% accuracy.    Time  8    Period  Weeks    Status  Partially Met    Target Date  07/13/18      SLP LONG TERM GOAL #2   Title  Patient will read aloud or repeat words, maintaining phonemic accuracy, with 80% accuracy.      Time  8    Period  Weeks    Status  On-going    Target Date  07/13/18      SLP LONG TERM GOAL #3   Title  Patient will complete 3 unit processing tasks with 80% accuracy without the need of repetition of task instructions or significant delays in responding.    Time  8    Period  Weeks    Status  Partially Met    Target Date  07/13/18      SLP LONG TERM GOAL #4   Title  Patient will demonstrate reading comprehension for words and phrases with 80% accuracy.     Time  8    Period  Weeks    Status  On-going    Target Date  07/13/18       Plan - 06/16/18 1623    Clinical Impression Statement  Patient is alert and eager to work on his communication.  He demonstrates improving auditory comprehension for lengthier and more complex verbal information.  He is gaining greater control for tracing and copying words.  He is demonstrating increasing creative non-verbal communication strategies.  He is making modest improvement in speech ouput.  Will continue ST with focus on auditory comprehension, verbal output, and reading/spelling.    Speech Therapy Frequency  2x / week    Duration  Other (comment)    Treatment/Interventions  Language facilitation;Multimodal communcation approach;Patient/family education;SLP instruction and feedback    Potential to Achieve Goals  Good    Potential Considerations  Ability to learn/carryover information;Pain level;Family/community support;Co-morbidities;Previous level of function;Cooperation/participation level;Severity of impairments;Medical prognosis    Consulted and Agree with Plan of Care  Patient       Patient will benefit from skilled therapeutic intervention in order to improve the following deficits  and impairments:   Aphasia  Apraxia    Problem List Patient Active Problem List   Diagnosis Date Noted  . CVA (cerebral vascular accident) ([place]) 05/02/2018  . Neurogenic bowel   . Neurogenic bladder   . Spastic hemiparesis ([place])   . 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 Cerebral infarction due to embolism of left middle cerebral artery (HCC) s/p thrombectomy 03/01/2018  .  Carotid artery dissection ([place]) Left 03/01/2018  . Acute ischemic right MCA stroke ([place]) 03/01/2018  . Right hemiparesis ([place])   . Dysphagia, post-stroke   . Global aphasia   . Leukocytosis   . Folic acid deficiency 03/50/0938  . B12 deficiency 02/23/2018  . Hyperhomocysteinemia ([place]) 02/23/2018  . Smoker    Leroy Sea, MS/CCC- SLP  Lou Miner 06/16/2018, 4:25 PM  [place] MAIN [facility] SERVICES [address] [place], [place], [address] Phone: [phone]   Fax:  [phone]   Name: Xavier Villegas MRN: [id number] Date of Birth: [birth year]
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
## 2018-06-21 ENCOUNTER — Ambulatory Visit: Payer: BLUE CROSS/BLUE SHIELD | Admitting: Occupational Therapy

## 2018-06-21 ENCOUNTER — Encounter: Payer: Self-pay | Admitting: Speech Pathology

## 2018-06-21 ENCOUNTER — Ambulatory Visit: Payer: BLUE CROSS/BLUE SHIELD

## 2018-06-21 ENCOUNTER — Ambulatory Visit: Payer: BLUE CROSS/BLUE SHIELD | Admitting: Speech Pathology

## 2018-06-21 DIAGNOSIS — R4701 Aphasia: Secondary | ICD-10-CM

## 2018-06-21 DIAGNOSIS — I69351 Hemiplegia and hemiparesis following cerebral infarction affecting right dominant side: Secondary | ICD-10-CM | POA: Diagnosis not present

## 2018-06-21 DIAGNOSIS — R482 Apraxia: Secondary | ICD-10-CM | POA: Diagnosis not present

## 2018-06-21 DIAGNOSIS — R262 Difficulty in walking, not elsewhere classified: Secondary | ICD-10-CM | POA: Diagnosis not present

## 2018-06-21 DIAGNOSIS — R2689 Other abnormalities of gait and mobility: Secondary | ICD-10-CM | POA: Diagnosis not present

## 2018-06-21 DIAGNOSIS — M6281 Muscle weakness (generalized): Secondary | ICD-10-CM

## 2018-06-21 DIAGNOSIS — R278 Other lack of coordination: Secondary | ICD-10-CM

## 2018-06-21 NOTE — Therapy (Addendum)
[place] MAIN [facility] SERVICES [address] [place], [place], [address] Phone: [phone]   Fax:  [phone]  Physical Therapy Treatment  Patient Details  Name: Xavier Villegas MRN: [id number] Date of Birth: 1981-07-23 Referring Provider (PT): Reesa Chew   Encounter Date: 06/21/2018  PT End of Session - 06/21/18 1110    Visit Number  17    Number of Visits  25    Date for PT Re-Evaluation  07/14/18    Authorization Type  PN 4/10 begins 10/31    PT Start Time  1015    PT Stop Time  1100    PT Time Calculation (min)  45 min    Equipment Utilized During Treatment  Gait belt    Activity Tolerance  Patient tolerated treatment well    Behavior During Therapy  [facility] for tasks assessed/performed       Past Medical History:  Diagnosis Date  . Aphasia   . GERD (gastroesophageal reflux disease)   . Hemiparesis ([place]) 02/2018   right side  . Hyperhomocysteinemia ([place])   . Neurogenic bladder 02/2018   04/26/18 inporving  . Spastic hemiparesis (HCC)    right side  . Stroke ([place])    Left MCA infart  . Tobacco abuse     Past Surgical History:  Procedure Laterality Date  . CRANIOTOMY Left 02/21/2018   Procedure: DECOMPRESSIVE CRANIECTOMY with bone flap to abdomen;  Surgeon: [name], MD;  Location: [place];  Service: Neurosurgery;  Laterality: Left;  DECOMPRESSIVE CRANIECTOMY with bone flap to abdomen  . CRANIOTOMY N/A 05/02/2018   Procedure: RE-IMPLANTATION OF CRANIAL FLAP;  Surgeon: [name], MD;  Location: [place];  Service: Neurosurgery;  Laterality: N/A;  RE-IMPLANTATION OF CRANIAL FLAP  . IR ANGIO INTRA EXTRACRAN SEL COM CAROTID INNOMINATE UNI R MOD SED  02/19/2018  . IR ANGIO VERTEBRAL SEL VERTEBRAL BILAT MOD SED  02/19/2018  . IR CT HEAD LTD  02/19/2018  . IR PERCUTANEOUS ART THROMBECTOMY/INFUSION INTRACRANIAL INC DIAG ANGIO  02/19/2018  . IR US GUIDE VASC ACCESS RIGHT  02/19/2018  . RADIOLOGY WITH ANESTHESIA N/A 02/19/2018   Procedure: IR WITH ANESTHESIA CODE STROKE;  Surgeon: Corrie Mckusick, DO;  Location: [place];  Service: Anesthesiology;  Laterality: N/A;  . TEE WITHOUT CARDIOVERSION N/A 03/01/2018   Procedure: TRANSESOPHAGEAL ECHOCARDIOGRAM (TEE);  Surgeon: Sanda Klein, MD;  Location: [facility] ENDOSCOPY;  Service: Cardiovascular;  Laterality: N/A;    There were no vitals filed for this visit.  Subjective Assessment - 06/21/18 1020    Subjective  Patient states he had a good weekend. Denies any falls or LOB since last session. States he has been stretching his ankle at home.     Pertinent History  Mr. Ickes  had left MCA infarct and subsequent craniectomy.  He was discharged from rehab several weeks ago. He has been home with his family  He is having occasional spasms on the right side still.Patient has Right hemiparesis, dysphagia, and aphasia secondary to large left MCA infarct with hemorrhagic transformation s/p hemicraniectomy.He had acute ischemic left MCA infarct and subsequent craniotomy - was first admitted on 03/01/18.  He was discharged from hospital on 04/06/18    Currently in Pain?  No/denies          Nustep lvl 5 108mnutes >70rpm for cardiovascular challenge. : use of lateral leg support attachment for RLE.    Vitals after exercise HR 100bpm     In //bars:   Modified tandem stance  with each leg shifting weight anteriorly/posteriorly to promote glute activation in front of mirror. Tactile cueing at ASIS and PSIS for appropriate anterior weight shift. 1 UE support CGA. MinA for foot position prior to exercise     Modified tandem stance with anterior/posterior weight shift stepping once appropriate anterior shift achieved. Tactile cueing at ASIS and PSIS for weight shift. Verbal cues to lift knee straight to ceiling and land with RLE heel x3 lengths of bars. x3 lengths of bars with 2 finger support   Completed above activity without UE support. x4 lengths of bars CGA. Patient required cueing to slow  down and prevent steps to allow for appropriate anterior weight shift onto RLE.   Ambulation forwards without UE assistance and backwards walking 1UE assist in //bars.x3 lengths of bars. Cued to slow down while walking without assistance to allow for adequate weight shift onto RLE. Tactile cueing for increased weight shift.   Standing step overs half foam roller x10 each LE. Verbal cues to prevent circumduction and heel contact with RLE. CGA   Seated stretching of achilles tendon RLE knee extended 2x 60 seconds.   Seated marches 2x15 each LE without back support. MinA to RLE to prevent abduction and proper hip flexion mechanics. Improved with faded assistance with repetitions  Seated adduction ball squeezes 2x10 pushing to therapist hands.                     PT Education - 06/21/18 1022    Education Details  exercise technique/form, gait, stretching    Person(s) Educated  Patient    Methods  Explanation;Demonstration;Verbal cues    Comprehension  Verbalized understanding;Returned demonstration       PT Short Term Goals - 06/09/18 1129      PT SHORT TERM GOAL #1   Title  Patient will be independent in home exercise program to improve strength/mobility for better functional independence with ADLs.    Baseline  05/10/18: HEP compliance    Time  6    Period  Weeks    Status  Achieved      PT SHORT TERM GOAL #2   Title  Patient (< 30 years old) will complete five times sit to stand test in < 10 seconds indicating an increased LE strength and improved balance.    Baseline  15.67 sec 05/10/18: 14seconds 10/31: 12 seconds with LUE assist    Time  6    Period  Weeks    Status  Partially Met      PT SHORT TERM GOAL #3   Title  Patient will reduce timed up and go to <11 seconds to reduce fall risk and demonstrate improved transfer/gait ability.    Baseline  18.88 sec 05/10/18: 21seconds10/31: 16seconds    Time  6    Period  Weeks    Status  On-going        PT Long  Term Goals - 06/09/18 1132      PT LONG TERM GOAL #1   Title  Patient will increase six minute walk test distance to >1000 for progression to community ambulator and improve gait ability    Baseline  585 feet 05/10/18: 526f 10/31: 6471f   Time  12    Period  Weeks    Status  On-going    Target Date  07/14/18      PT LONG TERM GOAL #2   Title  Patient will increase 10 meter walk test to >1.66m93mas to  improve gait speed for better community ambulation and to reduce fall risk.    Baseline  .48 m/sec 05/10/18: 0.100ms 10/31: 0.756m    Time  12    Period  Weeks    Status  On-going    Target Date  07/14/18      PT LONG TERM GOAL #3   Title  Patient will increase Berg Balance score by > 6 points to demonstrate decreased fall risk during functional activities.    Baseline  25/56 05/10/18: 36/56 10/31: 39/56    Time  12    Period  Weeks    Status  On-going    Target Date  07/14/18      PT LONG TERM GOAL #4   Title  Patient will be require no assist with ascend/descend 3 steps using Least restrictive assistive device.    Baseline  needs rail and cues for sequencing and min assist for RLE ascending the step 05/10/18: needs rail and cues for sequencing; heavily relies on UE 10/31: needs rail and cues for sequencing     Time  12    Period  Weeks    Status  Partially Met    Target Date  07/14/18            Plan - 06/21/18 1112    Clinical Impression Statement  Patient continues to tolerate sessions well. Patient ambulated without UE assistance in //bars with no LOB with cueing to allow for adequate weight shift and stance time on RLE. Weight shift and gait mechanics improved with repetition and cueing. Patient requires frequent cueing to decrease speed of movement to allow for adequate control and mechanics. Patient demonstrated adequate heel contact during step overs to improve gait mechanics and pattern. Patient will continue to benefit from skilled physical therapy to improve strength,  balance, gait, and functional mobility.     Rehab Potential  Good    PT Frequency  2x / week    PT Duration  12 weeks    PT Treatment/Interventions  Manual techniques;Patient/family education;Neuromuscular re-education;Balance training;Functional mobility training;Therapeutic activities;Therapeutic exercise;Gait training;Orthotic Fit/Training;Passive range of motion;ADLs/Self Care Home Management;Electrical Stimulation;Stair training;Energy conservation;Taping;DME Instruction    PT Next Visit Plan  strengthening, balance, gait training, uneven surfaces, ambulate without cane    PT Home Exercise Plan  standing abd bilaterally x 15    Consulted and Agree with Plan of Care  Patient       Patient will benefit from skilled therapeutic intervention in order to improve the following deficits and impairments:  Abnormal gait, Decreased balance, Decreased endurance, Decreased mobility, Difficulty walking, Impaired sensation, Increased muscle spasms, Impaired UE functional use, Impaired flexibility, Decreased strength, Decreased coordination, Decreased activity tolerance  Visit Diagnosis: Muscle weakness (generalized)  Other lack of coordination  Difficulty in walking, not elsewhere classified  Other abnormalities of gait and mobility     Problem List Patient Active Problem List   Diagnosis Date Noted  . CVA (cerebral vascular accident) ([place]09/23/2019  . Neurogenic bowel   . Neurogenic bladder   . Spastic hemiparesis ([place]  . 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 Cerebral infarction due to embolism of left middle cerebral artery (HCC) s/p thrombectomy 03/01/2018  . Carotid artery dissection ([place] 03/01/2018  . Acute ischemic right MCA stroke ([place]07/23/2019  . Right hemiparesis ([place]  . Dysphagia, post-stroke   . Global aphasia   . Leukocytosis   . Folic acid deficiency 0706/00/4599.  B12 deficiency 02/23/2018  .  Hyperhomocysteinemia ([place]) 02/23/2018  . Smoker    Erick Blinks, SPT  This entire session was performed under direct supervision and direction of a licensed therapist/therapist assistant . I have personally read, edited and approve of the note as written.  Janna Arch, PT, DPT   06/21/2018, 11:40 AM  [place] MAIN [facility] SERVICES [address] [place], [place], [address] Phone: [phone]   Fax:  [phone]  Name: Xavier Villegas MRN: [id number] Date of Birth: 1981-08-09

## 2018-06-21 NOTE — Therapy (Signed)
[place] MA[facility] SERVICES [address] [place], [place], [address] Phone: [phone]   Fax:  [phone]  Speech Language Pathology Treatment  Patient Details  Name: Xavier Villegas MRN: [id number] Date of Birth: 08-Jun-1981 Referring Provider (SLP): Bary Leriche    Encounter Date: 06/21/2018  End of Session - 06/21/18 1411    Visit Number  17    Number of Visits  25    Date for SLP Re-Evaluation  07/13/18    SLP Start Time  1100    SLP Stop Time   1150    SLP Time Calculation (min)  50 min    Activity Tolerance  Patient tolerated treatment well       Past Medical History:  Diagnosis Date  . Aphasia   . GERD (gastroesophageal reflux disease)   . Hemiparesis ([place]) 02/2018   right side  . Hyperhomocysteinemia ([place])   . Neurogenic bladder 02/2018   04/26/18 inporving  . Spastic hemiparesis (HCC)    right side  . Stroke ([place])    Left MCA infart  . Tobacco abuse     Past Surgical History:  Procedure Laterality Date  . CRANIOTOMY Left 02/21/2018   Procedure: DECOMPRESSIVE CRANIECTOMY with bone flap to abdomen;  Surgeon: [name], MD;  Location: [place];  Service: Neurosurgery;  Laterality: Left;  DECOMPRESSIVE CRANIECTOMY with bone flap to abdomen  . CRANIOTOMY N/A 05/02/2018   Procedure: RE-IMPLANTATION OF CRANIAL FLAP;  Surgeon: [name], MD;  Location: [place];  Service: Neurosurgery;  Laterality: N/A;  RE-IMPLANTATION OF CRANIAL FLAP  . IR ANGIO INTRA EXTRACRAN SEL COM CAROTID INNOMINATE UNI R MOD SED  02/19/2018  . IR ANGIO VERTEBRAL SEL VERTEBRAL BILAT MOD SED  02/19/2018  . IR CT HEAD LTD  02/19/2018  . IR PERCUTANEOUS ART THROMBECTOMY/INFUSION INTRACRANIAL INC DIAG ANGIO  02/19/2018  . IR US GUIDE VASC ACCESS RIGHT  02/19/2018  . RADIOLOGY WITH ANESTHESIA N/A 02/19/2018   Procedure: IR WITH ANESTHESIA CODE STROKE;  Surgeon: Corrie Mckusick, DO;  Location: [place];  Service: Anesthesiology;  Laterality: N/A;  . TEE  WITHOUT CARDIOVERSION N/A 03/01/2018   Procedure: TRANSESOPHAGEAL ECHOCARDIOGRAM (TEE);  Surgeon: Sanda Klein, MD;  Location: [facility] ENDOSCOPY;  Service: Cardiovascular;  Laterality: N/A;    There were no vitals filed for this visit.  Subjective Assessment - 06/21/18 1409    Subjective  Patient generated spontaneous social greeting            ADULT SLP TREATMENT - 06/21/18 0001      General Information   Behavior/Cognition  Alert;Cooperative;Pleasant mood    HPI   37 year old man with left MCA CVA 02/19/2018.  Patient received inpatient rehab services, including SLP, for 5 weeks.       Treatment Provided   Treatment provided  Cognitive-Linquistic      Pain Assessment   Pain Assessment  No/denies pain      Cognitive-Linquistic Treatment   Treatment focused on  Aphasia;Apraxia    Skilled Treatment  EXPRESSION:  Convey activity completed over the weekend with max questioning by SLP.  SPEECH:  Imitate 5 words, able to repeat X3 while maintaining phonemic accuracy for 3 of the 5. Approximate 6 words- typically the initial phoneme is incorrect with the remainder of the word correct.  Count to 10, in unison, with 90% accuracy.  Generate accurate word in phrase completion task with 40% accuracy and close approximation with 45% accuracy.  COMPREHENSION:  Answer multi-unit  yes no questions with 95% accuracy.  Identify stated picture (f=15) with 100% accuracy.    WRITING: point to letters in his name given extra time and 2 cues.      Assessment / Recommendations / Plan   Plan  Continue with current plan of care      Progression Toward Goals   Progression toward goals  Progressing toward goals       SLP Education - 06/21/18 1410    Education Details  Increase attemtpts to talk, use creative strategies to convey information    Person(s) Educated  Patient    Methods  Explanation    Comprehension  Verbalized understanding         SLP Long Term Goals - 06/01/18 3382      SLP LONG  TERM GOAL #1   Title  Patient will name objects using multi-modal strategies with 80% accuracy.    Time  8    Period  Weeks    Status  Partially Met    Target Date  07/13/18      SLP LONG TERM GOAL #2   Title  Patient will read aloud or repeat words, maintaining phonemic accuracy, with 80% accuracy.      Time  8    Period  Weeks    Status  On-going    Target Date  07/13/18      SLP LONG TERM GOAL #3   Title  Patient will complete 3 unit processing tasks with 80% accuracy without the need of repetition of task instructions or significant delays in responding.    Time  8    Period  Weeks    Status  Partially Met    Target Date  07/13/18      SLP LONG TERM GOAL #4   Title  Patient will demonstrate reading comprehension for words and phrases with 80% accuracy.     Time  8    Period  Weeks    Status  On-going    Target Date  07/13/18       Plan - 06/21/18 1411    Clinical Impression Statement  Patient is alert and eager to work on his communication.  He demonstrates improving auditory comprehension for lengthier and more complex verbal information.  He is gaining greater control for tracing and copying words.  He is demonstrating increasing creative non-verbal communication strategies.  He is making modest improvement in speech ouput.  Will continue ST with focus on auditory comprehension, verbal output, and reading/spelling.    Speech Therapy Frequency  2x / week    Duration  Other (comment)    Treatment/Interventions  Language facilitation;Multimodal communcation approach;Patient/family education;SLP instruction and feedback    Potential to Achieve Goals  Good    Potential Considerations  Ability to learn/carryover information;Pain level;Family/community support;Co-morbidities;Previous level of function;Cooperation/participation level;Severity of impairments;Medical prognosis    Consulted and Agree with Plan of Care  Patient       Patient will benefit from skilled therapeutic  intervention in order to improve the following deficits and impairments:   Aphasia  Apraxia    Problem List Patient Active Problem List   Diagnosis Date Noted  . CVA (cerebral vascular accident) ([place]) 05/02/2018  . Neurogenic bowel   . Neurogenic bladder   . Spastic hemiparesis ([place])   . 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 Cerebral infarction due to embolism of left middle cerebral artery (HCC) s/p thrombectomy 03/01/2018  .  Carotid artery dissection ([place]) Left 03/01/2018  . Acute ischemic right MCA stroke ([place]) 03/01/2018  . Right hemiparesis ([place])   . Dysphagia, post-stroke   . Global aphasia   . Leukocytosis   . Folic acid deficiency 91/44/4584  . B12 deficiency 02/23/2018  . Hyperhomocysteinemia ([place]) 02/23/2018  . Smoker    Xavier Sea, MS/CCC- SLP  Xavier Villegas 06/21/2018, 2:12 PM  [place] MAIN [facility] SERVICES [address] [place], [place], [address] Phone: [phone]   Fax:  [phone]   Name: Xavier Villegas MRN: [id number] Date of Birth: 12-Sep-1980

## 2018-06-22 ENCOUNTER — Encounter: Payer: Self-pay | Admitting: Occupational Therapy
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
## 2018-06-23 ENCOUNTER — Encounter: Payer: Self-pay | Admitting: Speech Pathology

## 2018-06-23 ENCOUNTER — Other Ambulatory Visit: Payer: Self-pay

## 2018-06-23 ENCOUNTER — Ambulatory Visit: Payer: BLUE CROSS/BLUE SHIELD | Admitting: Speech Pathology

## 2018-06-23 ENCOUNTER — Encounter: Payer: Self-pay | Admitting: Occupational Therapy

## 2018-06-23 ENCOUNTER — Ambulatory Visit: Payer: BLUE CROSS/BLUE SHIELD | Admitting: Occupational Therapy

## 2018-06-23 ENCOUNTER — Ambulatory Visit: Payer: BLUE CROSS/BLUE SHIELD

## 2018-06-23 DIAGNOSIS — R4701 Aphasia: Secondary | ICD-10-CM | POA: Diagnosis not present

## 2018-06-23 DIAGNOSIS — R262 Difficulty in walking, not elsewhere classified: Secondary | ICD-10-CM

## 2018-06-23 DIAGNOSIS — R278 Other lack of coordination: Secondary | ICD-10-CM

## 2018-06-23 DIAGNOSIS — I69351 Hemiplegia and hemiparesis following cerebral infarction affecting right dominant side: Secondary | ICD-10-CM | POA: Diagnosis not present

## 2018-06-23 DIAGNOSIS — M6281 Muscle weakness (generalized): Secondary | ICD-10-CM

## 2018-06-23 DIAGNOSIS — R482 Apraxia: Secondary | ICD-10-CM

## 2018-06-23 DIAGNOSIS — R2689 Other abnormalities of gait and mobility: Secondary | ICD-10-CM
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
## 2018-06-23 NOTE — Therapy (Signed)
[place] MAIN [facility] SERVICES [address] [place], [place], [address] Phone: [phone]   Fax:  [phone]  Speech Language Pathology Treatment  Patient Details  Name: Xavier Villegas MRN: [id number] Date of Birth: 02-Dec-1980 Referring Provider (SLP): Bary Leriche    Encounter Date: 06/23/2018  End of Session - 06/23/18 1113    Visit Number  18    Number of Visits  25    Date for SLP Re-Evaluation  07/13/18    SLP Start Time  0900    SLP Stop Time   0950    SLP Time Calculation (min)  50 min    Activity Tolerance  Patient tolerated treatment well       Past Medical History:  Diagnosis Date  . Aphasia   . GERD (gastroesophageal reflux disease)   . Hemiparesis ([place]) 02/2018   right side  . Hyperhomocysteinemia ([place])   . Neurogenic bladder 02/2018   04/26/18 inporving  . Spastic hemiparesis (H[facility])    right side  . Stroke ([place])    Left MCA infart  . Tobacco abuse     Past Surgical History:  Procedure Laterality Date  . CRANIOTOMY Left 02/21/2018   Procedure: DECOMPRESSIVE CRANIECTOMY with bone flap to abdomen;  Surgeon: [name], MD;  Location: [place];  Service: Neurosurgery;  Laterality: Left;  DECOMPRESSIVE CRANIECTOMY with bone flap to abdomen  . CRANIOTOMY N/A 05/02/2018   Procedure: RE-IMPLANTATION OF CRANIAL FLAP;  Surgeon: [name], MD;  Location: [place];  Service: Neurosurgery;  Laterality: N/A;  RE-IMPLANTATION OF CRANIAL FLAP  . IR ANGIO INTRA EXTRACRAN SEL COM CAROTID INNOMINATE UNI R MOD SED  02/19/2018  . IR ANGIO VERTEBRAL SEL VERTEBRAL BILAT MOD SED  02/19/2018  . IR CT HEAD LTD  02/19/2018  . IR PERCUTANEOUS ART THROMBECTOMY/INFUSION INTRACRANIAL INC DIAG ANGIO  02/19/2018  . IR US GUIDE VASC ACCESS RIGHT  02/19/2018  . RADIOLOGY WITH ANESTHESIA N/A 02/19/2018   Procedure: IR WITH ANESTHESIA CODE STROKE;  Surgeon: Corrie Mckusick, DO;  Location: [place];  Service: Anesthesiology;  Laterality: N/A;  . TEE  WITHOUT CARDIOVERSION N/A 03/01/2018   Procedure: TRANSESOPHAGEAL ECHOCARDIOGRAM (TEE);  Surgeon: Sanda Klein, MD;  Location: [facility] ENDOSCOPY;  Service: Cardiovascular;  Laterality: N/A;    There were no vitals filed for this visit.  Subjective Assessment - 06/23/18 1112    Subjective  Patient generated spontaneous social greeting            ADULT SLP TREATMENT - 06/23/18 0001      General Information   Behavior/Cognition  Alert;Cooperative;Pleasant mood    HPI   37 year old man with left MCA CVA 02/19/2018.  Patient received inpatient rehab services, including SLP, for 5 weeks.       Treatment Provided   Treatment provided  Cognitive-Linquistic      Pain Assessment   Pain Assessment  No/denies pain      Cognitive-Linquistic Treatment   Treatment focused on  Aphasia;Apraxia    Skilled Treatment  EXPRESSION:  Convey activity completed since last session with max questioning by SLP.  SPEECH:  Imitate 4 words, able to repeat X3 while maintaining phonemic accuracy for 2 of the 4. Approximate 6 words- typically the initial phoneme is incorrect with the remainder of the word correct.  Count to 10, in unison, with 90% accuracy.  COMPREHENSION:  Answer multi-unit yes no questions with 95% accuracy.  Identify stated picture (f=15) with 100% accuracy.  Demonstrate comprehension of auditory phrase/sentence with 75% accuracy.  Follow 3-unit commands with pictures given repetition, emphasis, and semantic cues.      Assessment / Recommendations / Plan   Plan  Continue with current plan of care      Progression Toward Goals   Progression toward goals  Progressing toward goals       SLP Education - 06/23/18 1113    Education Details  Increase attempts to talk, use creative strategies to convey information    Person(s) Educated  Patient    Methods  Explanation    Comprehension  Verbalized understanding         SLP Long Term Goals - 06/01/18 2725      SLP LONG TERM GOAL #1   Title   Patient will name objects using multi-modal strategies with 80% accuracy.    Time  8    Period  Weeks    Status  Partially Met    Target Date  07/13/18      SLP LONG TERM GOAL #2   Title  Patient will read aloud or repeat words, maintaining phonemic accuracy, with 80% accuracy.      Time  8    Period  Weeks    Status  On-going    Target Date  07/13/18      SLP LONG TERM GOAL #3   Title  Patient will complete 3 unit processing tasks with 80% accuracy without the need of repetition of task instructions or significant delays in responding.    Time  8    Period  Weeks    Status  Partially Met    Target Date  07/13/18      SLP LONG TERM GOAL #4   Title  Patient will demonstrate reading comprehension for words and phrases with 80% accuracy.     Time  8    Period  Weeks    Status  On-going    Target Date  07/13/18       Plan - 06/23/18 1114    Clinical Impression Statement  Patient is alert and eager to work on his communication.  He demonstrates improving auditory comprehension for lengthier and more complex verbal information.  He is gaining greater control for tracing and copying words.  He is demonstrating increasing creative non-verbal communication strategies.  He is making modest improvement in speech ouput.  Will continue ST with focus on auditory comprehension, verbal output, and reading/spelling.    Speech Therapy Frequency  2x / week    Duration  Other (comment)    Treatment/Interventions  Language facilitation;Multimodal communcation approach;Patient/family education;SLP instruction and feedback    Potential to Achieve Goals  Good    Potential Considerations  Ability to learn/carryover information;Pain level;Family/community support;Co-morbidities;Previous level of function;Cooperation/participation level;Severity of impairments;Medical prognosis    Consulted and Agree with Plan of Care  Patient       Patient will benefit from skilled therapeutic intervention in order to  improve the following deficits and impairments:   Aphasia  Apraxia    Problem List Patient Active Problem List   Diagnosis Date Noted  . CVA (cerebral vascular accident) ([place]) 05/02/2018  . Neurogenic bowel   . Neurogenic bladder   . Spastic hemiparesis ([place])   . 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 Cerebral infarction due to embolism of left middle cerebral artery (HCC) s/p thrombectomy 03/01/2018  . Carotid artery dissection ([place]) Left 03/01/2018  . Acute ischemic  right MCA stroke ([place]) 03/01/2018  . Right hemiparesis ([place])   . Dysphagia, post-stroke   . Global aphasia   . Leukocytosis   . Folic acid deficiency 81/77/1165  . B12 deficiency 02/23/2018  . Hyperhomocysteinemia ([place]) 02/23/2018  . Smoker    Leroy Sea, MS/CCC- SLP  Lou Miner 06/23/2018, 11:15 AM  [place] MAIN [facility] SERVICES [address] [place], [place], [address] Phone: [phone]   Fax:  [phone]   Name: Xavier Villegas MRN: [id number] Date of Birth: October 21, 1980

## 2018-06-23 NOTE — Therapy (Signed)
[place] MAIN [facility] SERVICES [address] [place], [place], [address] Phone: [phone]   Fax:  [phone]  Physical Therapy Treatment  Patient Details  Name: Graysyn Bache MRN: [id number] Date of Birth: [birth year] Referring Provider (PT): Reesa Chew   Encounter Date: 06/23/2018  PT End of Session - 06/23/18 1109    Visit Number  18    Number of Visits  25    Date for PT Re-Evaluation  07/14/18    Authorization Type  PN 5/10 begins 10/31    PT Start Time  1015    PT Stop Time  1100    PT Time Calculation (min)  45 min    Equipment Utilized During Treatment  Gait belt    Activity Tolerance  Patient tolerated treatment well    Behavior During Therapy  [facility] for tasks assessed/performed       Past Medical History:  Diagnosis Date  . Aphasia   . GERD (gastroesophageal reflux disease)   . Hemiparesis ([place]) 02/2018   right side  . Hyperhomocysteinemia ([place])   . Neurogenic bladder 02/2018   04/26/18 inporving  . Spastic hemiparesis (HCC)    right side  . Stroke ([place])    Left MCA infart  . Tobacco abuse     Past Surgical History:  Procedure Laterality Date  . CRANIOTOMY Left 02/21/2018   Procedure: DECOMPRESSIVE CRANIECTOMY with bone flap to abdomen;  Surgeon: [name], MD;  Location: [place];  Service: Neurosurgery;  Laterality: Left;  DECOMPRESSIVE CRANIECTOMY with bone flap to abdomen  . CRANIOTOMY N/A 05/02/2018   Procedure: RE-IMPLANTATION OF CRANIAL FLAP;  Surgeon: [name], MD;  Location: [place];  Service: Neurosurgery;  Laterality: N/A;  RE-IMPLANTATION OF CRANIAL FLAP  . IR ANGIO INTRA EXTRACRAN SEL COM CAROTID INNOMINATE UNI R MOD SED  02/19/2018  . IR ANGIO VERTEBRAL SEL VERTEBRAL BILAT MOD SED  02/19/2018  . IR CT HEAD LTD  02/19/2018  . IR PERCUTANEOUS ART THROMBECTOMY/INFUSION INTRACRANIAL INC DIAG ANGIO  02/19/2018  . IR US GUIDE VASC ACCESS RIGHT  02/19/2018  . RADIOLOGY WITH ANESTHESIA N/A 02/19/2018    Procedure: IR WITH ANESTHESIA CODE STROKE;  Surgeon: Corrie Mckusick, DO;  Location: [place];  Service: Anesthesiology;  Laterality: N/A;  . TEE WITHOUT CARDIOVERSION N/A 03/01/2018   Procedure: TRANSESOPHAGEAL ECHOCARDIOGRAM (TEE);  Surgeon: Sanda Klein, MD;  Location: [facility] ENDOSCOPY;  Service: Cardiovascular;  Laterality: N/A;    There were no vitals filed for this visit.  Subjective Assessment - 06/23/18 1107    Subjective  Patient reports he is having a good day. Is a little tired but has been doing his exercises. Has been having some troubles with stairs due to scuffing his foot.     Pertinent History  Mr. Molyneux  had left MCA infarct and subsequent craniectomy.  He was discharged from rehab several weeks ago. He has been home with his family  He is having occasional spasms on the right side still.Patient has Right hemiparesis, dysphagia, and aphasia secondary to large left MCA infarct with hemorrhagic transformation s/p hemicraniectomy.He had acute ischemic left MCA infarct and subsequent craniotomy - was first admitted on 03/01/18.  He was discharged from hospital on 04/06/18    Currently in Pain?  No/denies      Supine: R ankle:   df stretch with overpressure 2x 30 seconds  Df stretch with AP mobilization and distraction for maximum tissue length  Eversion stretch 2x60 seconds due to inversion preference  RLE: popliteal angle stretch 60 seconds, IT band stretch 60 seconds   Standing:   green pad under LLE for weight shift onto RLE balloon taps into mirror; CGA; 2 sets of 20 taps  Soccer ball knee kicks standing with LUE support.10x each LE  Single leg heel strike 10x each LE; LUE support; mod A to pelvis for weight shift  Singe leg heel strike with mini squat of supporting leg: 10x each leg, LUE support. ; mod A for pelvis weight shift  Stairs:  Ascend/Descend 5 steps with LUE support: step to pattern with LLE ascending, RLE descending. Verbal cues for flexing knee and lifting  superiorly prior to forward momentum to reduce scuffing of toe on step  Seated:  LAQ: tactile cueing for activation of RLE ; PT perform tactile cueing first 10x, patient performed second 10 for home practice   IR ER step outs15x RLE                     PT Education - 06/23/18 1109    Education Details  exercise technique, form, gait, stretching    Person(s) Educated  Patient    Methods  Explanation;Demonstration;Tactile cues;Verbal cues    Comprehension  Verbalized understanding;Returned demonstration;Need further instruction       PT Short Term Goals - 06/09/18 1129      PT SHORT TERM GOAL #1   Title  Patient will be independent in home exercise program to improve strength/mobility for better functional independence with ADLs.    Baseline  05/10/18: HEP compliance    Time  6    Period  Weeks    Status  Achieved      PT SHORT TERM GOAL #2   Title  Patient (< 65 years old) will complete five times sit to stand test in < 10 seconds indicating an increased LE strength and improved balance.    Baseline  15.67 sec 05/10/18: 14seconds 10/31: 12 seconds with LUE assist    Time  6    Period  Weeks    Status  Partially Met      PT SHORT TERM GOAL #3   Title  Patient will reduce timed up and go to <11 seconds to reduce fall risk and demonstrate improved transfer/gait ability.    Baseline  18.88 sec 05/10/18: 21seconds10/31: 16seconds    Time  6    Period  Weeks    Status  On-going        PT Long Term Goals - 06/09/18 1132      PT LONG TERM GOAL #1   Title  Patient will increase six minute walk test distance to >1000 for progression to community ambulator and improve gait ability    Baseline  585 feet 05/10/18: 546f 10/31: 6473f   Time  12    Period  Weeks    Status  On-going    Target Date  07/14/18      PT LONG TERM GOAL #2   Title  Patient will increase 10 meter walk test to >1.68m31mas to improve gait speed for better community ambulation and to reduce fall  risk.    Baseline  .48 m/sec 05/10/18: 0.108m868m0/31: 0.37m/8m Time  12    Period  Weeks    Status  On-going    Target Date  07/14/18      PT LONG TERM GOAL #3   Title  Patient will increase Berg Balance score by > 6 points  to demonstrate decreased fall risk during functional activities.    Baseline  25/56 05/10/18: 36/56 10/31: 39/56    Time  12    Period  Weeks    Status  On-going    Target Date  07/14/18      PT LONG TERM GOAL #4   Title  Patient will be require no assist with ascend/descend 3 steps using Least restrictive assistive device.    Baseline  needs rail and cues for sequencing and min assist for RLE ascending the step 05/10/18: needs rail and cues for sequencing; heavily relies on UE 10/31: needs rail and cues for sequencing     Time  12    Period  Weeks    Status  Partially Met    Target Date  07/14/18            Plan - 06/23/18 1110    Clinical Impression Statement  Patient demonstrates limited muscle tissue length with tone of RLE limiting his coordination and stability due to preference for circumduction and limited heel strike. Prolonged holds combined with mobilization allowed for ankle mobility to be improved for standing interventions. Circumduction reduced with visual cueing for alignment. Stair negotiation improved with verbal cueing of body mechanics with patient demonstrating understanding and carryover. Patient will continue to benefit from skilled physical therapy to improve strength, balance, gait, and functional mobility.     Rehab Potential  Good    PT Frequency  2x / week    PT Duration  12 weeks    PT Treatment/Interventions  Manual techniques;Patient/family education;Neuromuscular re-education;Balance training;Functional mobility training;Therapeutic activities;Therapeutic exercise;Gait training;Orthotic Fit/Training;Passive range of motion;ADLs/Self Care Home Management;Electrical Stimulation;Stair training;Energy conservation;Taping;DME  Instruction    PT Next Visit Plan  strengthening, balance, gait training, uneven surfaces, ambulate without cane    PT Home Exercise Plan  standing abd bilaterally x 15    Consulted and Agree with Plan of Care  Patient       Patient will benefit from skilled therapeutic intervention in order to improve the following deficits and impairments:  Abnormal gait, Decreased balance, Decreased endurance, Decreased mobility, Difficulty walking, Impaired sensation, Increased muscle spasms, Impaired UE functional use, Impaired flexibility, Decreased strength, Decreased coordination, Decreased activity tolerance  Visit Diagnosis: Muscle weakness (generalized)  Other lack of coordination  Difficulty in walking, not elsewhere classified  Other abnormalities of gait and mobility  Hemiplegia and hemiparesis following cerebral infarction affecting right dominant side [facility])     Problem List Patient Active Problem List   Diagnosis Date Noted  . CVA (cerebral vascular accident) ([place]) 05/02/2018  . Neurogenic bowel   . Neurogenic bladder   . Spastic hemiparesis ([place])   . 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 Cerebral infarction due to embolism of left middle cerebral artery (HCC) s/p thrombectomy 03/01/2018  . Carotid artery dissection ([place]) Left 03/01/2018  . Acute ischemic right MCA stroke ([place]) 03/01/2018  . Right hemiparesis ([place])   . Dysphagia, post-stroke   . Global aphasia   . Leukocytosis   . Folic acid deficiency 45/85/9292  . B12 deficiency 02/23/2018  . Hyperhomocysteinemia ([place]) 02/23/2018  . Smoker    Janna Arch, PT, DPT   06/23/2018, 11:12 AM  [place] MAIN [facility] SERVICES [place], [place], [address] Phone: [phone]   Fax:  [phone]  Name: Kenan Moodie MRN: [id number] Date of Birth: 02-Dec-1980

## 2018-06-23 NOTE — Patient Outreach (Signed)
Telephone outreach to patient to obtain mRs was successfully completed. mRs= 3. Spoke with father (on [place]) to obtain score.

## 2018-06-24 NOTE — Progress Notes (Signed)
Cardiology Office Note   Date:  06/30/2018   ID:  Xavier Villegas, DOB 1981/05/14, MRN [id number]  PCP:  Lisabeth Pick, MD  Cardiologist:   Jenkins Rouge, MD   No chief complaint on file.     History of Present Illness: Xavier Villegas is a 37 y.o. male who presents for post hospital f/u.  Seen 02/21/18 after left MCA stroke. Rx thrombectomy but failed and required craniotomy for bleeding Had right hemiparesis. Initial TTE suggested AV SBE. Had antecedent soft tissue infection of lip However after stabilization TEE done by Dr C 03/01/18 was normal with no source of embolus and no endocarditis Subsequent MRA 05/25/18 showed total occlusion of left ICA near origin With chronic infarct involving entire left MCA territory. Patient never had clinical symptoms or signs of SBE Bothered by RUE spasticity , spastic bladder and gait instability. Placed on Baclofen 06/15/18  With improvement  Living with parents No cardiac complaints    Past Medical History:  Diagnosis Date  . Aphasia   . GERD (gastroesophageal reflux disease)   . Hemiparesis ([place]) 02/2018   right side  . Hyperhomocysteinemia ([place])   . Neurogenic bladder 02/2018   04/26/18 inporving  . Spastic hemiparesis (HCC)    right side  . Stroke ([place])    Left MCA infart  . Tobacco abuse     Past Surgical History:  Procedure Laterality Date  . CRANIOTOMY Left 02/21/2018   Procedure: DECOMPRESSIVE CRANIECTOMY with bone flap to abdomen;  Surgeon: [name], MD;  Location: [place];  Service: Neurosurgery;  Laterality: Left;  DECOMPRESSIVE CRANIECTOMY with bone flap to abdomen  . CRANIOTOMY N/A 05/02/2018   Procedure: RE-IMPLANTATION OF CRANIAL FLAP;  Surgeon: [name], MD;  Location: [place];  Service: Neurosurgery;  Laterality: N/A;  RE-IMPLANTATION OF CRANIAL FLAP  . IR ANGIO INTRA EXTRACRAN SEL COM CAROTID INNOMINATE UNI R MOD SED  02/19/2018  . IR ANGIO VERTEBRAL SEL VERTEBRAL BILAT MOD SED  02/19/2018  . IR CT  HEAD LTD  02/19/2018  . IR PERCUTANEOUS ART THROMBECTOMY/INFUSION INTRACRANIAL INC DIAG ANGIO  02/19/2018  . IR US GUIDE VASC ACCESS RIGHT  02/19/2018  . RADIOLOGY WITH ANESTHESIA N/A 02/19/2018   Procedure: IR WITH ANESTHESIA CODE STROKE;  Surgeon: Corrie Mckusick, DO;  Location: [place];  Service: Anesthesiology;  Laterality: N/A;  . TEE WITHOUT CARDIOVERSION N/A 03/01/2018   Procedure: TRANSESOPHAGEAL ECHOCARDIOGRAM (TEE);  Surgeon: Sanda Klein, MD;  Location: [facility] ENDOSCOPY;  Service: Cardiovascular;  Laterality: N/A;     Current Outpatient Medications  Medication Sig Dispense Refill  . aspirin 325 MG tablet Take 1 tablet (325 mg total) by mouth daily.    . baclofen (LIORESAL) 10 MG tablet Take 1 tablet (10 mg total) by mouth 4 (four) times daily. 120 each 3  . cyanocobalamin 1000 MCG tablet Take 1 tablet (1,000 mcg total) by mouth daily. 30 tablet 1  . folic acid (FOLVITE) 1 MG tablet Take 4 tablets (4 mg total) by mouth daily. 120 tablet 1  . pantoprazole (PROTONIX) 40 MG tablet Take 40 mg by mouth as needed.     . pyridOXINE (B-6) 50 MG tablet Take 1 tablet (50 mg total) by mouth daily. 30 tablet 1   No current facility-administered medications for this visit.     Allergies:   Patient has no known allergies.    Social History:  The patient  reports that he quit smoking about 4 months ago. His smoking use included cigarettes.  He has a 4.00 pack-year smoking history. He has never used smokeless tobacco. He reports that he drank about 14.0 standard drinks of alcohol per week. He reports that he does not use drugs.   Family History:  The patient's family history includes Cerebral aneurysm in his paternal grandfather; Diabetes in his father; High blood pressure in his father; Leukemia in his mother; Liver cancer in his maternal grandmother and paternal grandmother; Stroke in his maternal grandfather, maternal grandmother, and paternal grandfather.    ROS:  Please see the history of present  illness.   Otherwise, review of systems are positive for none.   All other systems are reviewed and negative.    PHYSICAL EXAM: VS:  BP 122/78   Pulse 84   Ht 5\' 6"  (1.676 m)   Wt 215 lb 8 oz (97.8 kg)   SpO2 98%   BMI 34.78 kg/m  , BMI Body mass index is 34.78 kg/m. Affect appropriate Healthy:  appears stated age 43: normal Neck supple with no adenopathy JVP normal no bruits no thyromegaly Lungs clear with no wheezing and good diaphragmatic motion Heart:  S1/S2 no murmur, no rub, gallop or click PMI normal Abdomen: benighn, BS positve, no tenderness, no AAA no bruit.  No HSM or HJR Distal pulses intact with no bruits No edema Expressive aphasia He has right hemiparesis  Skin warm and dry Not verbal     EKG:  02/25/18 SR rate 59 non specific ST changes    Recent Labs: 02/20/2018: TSH 1.000 03/08/2018: Magnesium 1.8 04/25/2018: ALT 121 05/02/2018: Hemoglobin 15.3; Platelets 317 05/03/2018: BUN 9; Creatinine, Ser 0.94; Potassium 3.9; Sodium 137    Lipid Panel    Component Value Date/Time   CHOL 101 02/20/2018 0547   TRIG 94 02/22/2018 1745   HDL 34 (L) 02/20/2018 0547   CHOLHDL 3.0 02/20/2018 0547   VLDL 21 02/20/2018 0547   LDLCALC 46 02/20/2018 0547      Wt Readings from Last 3 Encounters:  06/30/18 215 lb 8 oz (97.8 kg)  06/15/18 205 lb (93 kg)  05/17/18 181 lb (82.1 kg)      Other studies Reviewed: Additional studies/ records that were reviewed today include: Notes from stroke admission July , TTE, TEE labs and MRI/MRA.    ASSESSMENT AND PLAN:  1.  CVA:  Etiology appears to be LICA occlusion and not SBE or vegetation. TEE was clean No murmur on exam. No indication for further cardiac w/u  Still needs neuro PT/OT   Current medicines are reviewed at length with the patient today.  The patient does not have concerns regarding medicines.  The following changes have been made:  no change  Labs/ tests ordered today include: TTE  No orders of the  defined types were placed in this encounter.    Disposition:   FU with cardiology PRN      Signed, Jenkins Rouge, MD  06/30/2018 8:36 AM    [facility] [place], [place], [place]  [address] Phone: [phone]; Fax: [phone]

## 2018-06-27 ENCOUNTER — Ambulatory Visit: Payer: BLUE CROSS/BLUE SHIELD | Admitting: Physical Therapy

## 2018-06-27 ENCOUNTER — Ambulatory Visit: Payer: BLUE CROSS/BLUE SHIELD | Admitting: Occupational Therapy

## 2018-06-27 ENCOUNTER — Ambulatory Visit: Payer: BLUE CROSS/BLUE SHIELD | Attending: Family Medicine

## 2018-06-27 DIAGNOSIS — M6281 Muscle weakness (generalized): Secondary | ICD-10-CM | POA: Diagnosis not present

## 2018-06-27 DIAGNOSIS — R2689 Other abnormalities of gait and mobility: Secondary | ICD-10-CM

## 2018-06-27 DIAGNOSIS — R4701 Aphasia: Secondary | ICD-10-CM

## 2018-06-27 DIAGNOSIS — M25511 Pain in right shoulder: Secondary | ICD-10-CM | POA: Insufficient documentation

## 2018-06-27 DIAGNOSIS — R482 Apraxia: Secondary | ICD-10-CM | POA: Insufficient documentation

## 2018-06-27 DIAGNOSIS — I69351 Hemiplegia and hemiparesis following cerebral infarction affecting right dominant side: Secondary | ICD-10-CM | POA: Diagnosis not present

## 2018-06-27 NOTE — Patient Instructions (Signed)
Practice /m/ and "oo" separately
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
## 2018-06-27 NOTE — Patient Instructions (Signed)
Flexion (Assistive)    LAYING DOWN: Hold Rt wrist with Lt hand, thumb side up,  and raise arms above head, keeping elbows as straight as possible.  Repeat __10__ times. Do __2__ sessions per day.  Flexion (Passive)    Sitting upright, slide forearm forward along table, forwards and back x 10. Then big circles x 10 reps. Hold __3__ seconds. Repeat _10___ times. Do _2___ sessions per day.  Supination (Passive)    Keep elbow bent at right angle and held firmly at side. Use other hand to turn forearm until palm faces upward. Hold _10-20___ seconds. Repeat _5___ times. Do __2__ sessions per day.  Extension (Passive)    Using other hand, lift hand at wrist as far as possible. Hold _10-20___ seconds. Repeat __5__ times. Do _2___ sessions per day.  CAREGIVER ASSISTED: Straighten Fingers    Caregiver straightens fingers first with wrist down, then gradually bring wrist up. Patient looks at hand. Hold 10-20___ seconds. __5_ reps per set, _2__ sets per day

## 2018-06-27 NOTE — Therapy (Signed)
[facility] [address] [place], [place], [address] Phone: [phone]   Fax:  [phone]  Speech Language Pathology Treatment  Patient Details  Name: Xavier Villegas MRN: [id number] Date of Birth: Jan 08, 1981 Referring Provider (SLP): Alger Simons, MD   Encounter Date: 06/27/2018  End of Session - 06/27/18 1729    Visit Number  19    Number of Visits  25    Date for SLP Re-Evaluation  07/13/18    SLP Start Time  1151    SLP Stop Time   1231    SLP Time Calculation (min)  40 min    Activity Tolerance  Patient tolerated treatment well       Past Medical History:  Diagnosis Date  . Aphasia   . GERD (gastroesophageal reflux disease)   . Hemiparesis ([place]) 02/2018   right side  . Hyperhomocysteinemia ([place])   . Neurogenic bladder 02/2018   04/26/18 inporving  . Spastic hemiparesis (HCC)    right side  . Stroke ([place])    Left MCA infart  . Tobacco abuse     Past Surgical History:  Procedure Laterality Date  . CRANIOTOMY Left 02/21/2018   Procedure: DECOMPRESSIVE CRANIECTOMY with bone flap to abdomen;  Surgeon: [name], MD;  Location: [facility];  Service: Neurosurgery;  Laterality: Left;  DECOMPRESSIVE CRANIECTOMY with bone flap to abdomen  . CRANIOTOMY N/A 05/02/2018   Procedure: RE-IMPLANTATION OF CRANIAL FLAP;  Surgeon: [name], MD;  Location: [facility];  Service: Neurosurgery;  Laterality: N/A;  RE-IMPLANTATION OF CRANIAL FLAP  . IR ANGIO INTRA EXTRACRAN SEL COM CAROTID INNOMINATE UNI R MOD SED  02/19/2018  . IR ANGIO VERTEBRAL SEL VERTEBRAL BILAT MOD SED  02/19/2018  . IR CT HEAD LTD  02/19/2018  . IR PERCUTANEOUS ART THROMBECTOMY/INFUSION INTRACRANIAL INC DIAG ANGIO  02/19/2018  . IR US GUIDE VASC ACCESS RIGHT  02/19/2018  . RADIOLOGY WITH ANESTHESIA N/A 02/19/2018   Procedure: IR WITH ANESTHESIA CODE STROKE;  Surgeon: Corrie Mckusick, DO;  Location: [facility];  Service: Anesthesiology;  Laterality: N/A;  .  TEE WITHOUT CARDIOVERSION N/A 03/01/2018   Procedure: TRANSESOPHAGEAL ECHOCARDIOGRAM (TEE);  Surgeon: Sanda Klein, MD;  Location: [facility] ENDOSCOPY;  Service: Cardiovascular;  Laterality: N/A;    There were no vitals filed for this visit.  Subjective Assessment - 06/27/18 1720    Subjective  Patient did not generate spontaneous social greeting    Patient is accompained by:  Family member   father - Chrissie Noa   Currently in Pain?  No/denies            ADULT SLP TREATMENT - 06/27/18 1206      General Information   Behavior/Cognition  Alert;Cooperative;Pleasant mood      Treatment Provided   Treatment provided  Cognitive-Linquistic      Cognitive-Linquistic Treatment   Treatment focused on  Aphasia;Apraxia    Skilled Treatment  3-4 unit comprehension (money equations) 40% spontaneously - 90% with written cues. SLP cued father to write down key words if he senses/thinks pt did not understand him. Expressive language/speech(apraxia): consonant/vowel words were imitated with mod-max cues 25% success of opportunities. Pt heavy perseveration with "bee" response for all consonant/vowel combinations targeted today - pt was most successful with semantic/cloze phrase cues - SLP sent home "moo" with pt for him to practice. SLP educated father in tactile cues that may help pt (visual cue of ASL "eat" to approximate the lip position for /m/, tactile cue on pt's  labial margins assiting in protrusion for /u/). SLP provided prognosis of pt likely not talk like he did premorbidly, and mentioned augmentative/alternative communication possibility in teh future.       Assessment / Recommendations / Plan   Plan  Continue with current plan of care      Progression Toward Goals   Progression toward goals  Progressing toward goals       SLP Education - 06/27/18 1728    Education Details  tactile cues and visual cues for articuation of "m" and "oo", prognosis for different speech after CVA, possibility of  AAC in future    Person(s) Educated  Patient;Parent(s)    Methods  Explanation;Demonstration    Comprehension  Verbalized understanding;Need further instruction         SLP Long Term Goals - 06/27/18 1732      SLP LONG TERM GOAL #1   Title  Patient will name objects using multi-modal strategies with 80% accuracy.    Time  4    Period  Weeks    Status  On-going      SLP LONG TERM GOAL #2   Title  Patient will read aloud or repeat words, maintaining phonemic accuracy, with 80% accuracy.      Time  4    Period  Weeks    Status  On-going      SLP LONG TERM GOAL #3   Title  Patient will complete 3 unit processing tasks with 80% accuracy without the need of repetition of task instructions or significant delays in responding.    Time  4    Period  Weeks    Status  On-going      SLP LONG TERM GOAL #4   Title  Patient will demonstrate reading comprehension for words and phrases with 80% accuracy.     Time  4    Period  Weeks    Status  On-going       Plan - 06/27/18 1729    Clinical Impression Statement  Pt appears eager to work on his communication, with severe expressive language/apraxia and mod-severe recpetive aphasia. SLP recommends continued ST with focus on current goals.     Speech Therapy Frequency  2x / week    Duration  Other (comment)   12 weeks   Treatment/Interventions  Language facilitation;Multimodal communcation approach;Patient/family education;SLP instruction and feedback;Compensatory techniques;Internal/external aids;Functional tasks;Cueing hierarchy    Potential to Achieve Goals  Good    Potential Considerations  Ability to learn/carryover information;Pain level;Family/community support;Co-morbidities;Previous level of function;Cooperation/participation level;Severity of impairments;Medical prognosis    Consulted and Agree with Plan of Care  Patient       Patient will benefit from skilled therapeutic intervention in order to improve the following deficits  and impairments:   Aphasia  Apraxia    Problem List Patient Active Problem List   Diagnosis Date Noted  . CVA (cerebral vascular accident) ([place]) 05/02/2018  . Neurogenic bowel   . Neurogenic bladder   . Spastic hemiparesis ([place])   . 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 Cerebral infarction due to embolism of left middle cerebral artery (HCC) s/p thrombectomy 03/01/2018  . Carotid artery dissection ([place]) Left 03/01/2018  . Acute ischemic right MCA stroke ([place]) 03/01/2018  . Right hemiparesis ([place])   . Dysphagia, post-stroke   . Global aphasia   . Leukocytosis   . Folic acid deficiency 43/15/4008  . B12 deficiency 02/23/2018  . Hyperhomocysteinemia ([place])  02/23/2018  . Smoker     [facility] ,[place], CCC-SLP  06/27/2018, 5:34 PM  [facility] [address] [place], [place], [address] Phone: [phone]   Fax:  [phone]   Name: Andras Grunewald MRN: [id number] Date of Birth: 1980-11-07

## 2018-06-28 ENCOUNTER — Encounter: Payer: BLUE CROSS/BLUE SHIELD | Admitting: Occupational Therapy

## 2018-06-28 ENCOUNTER — Ambulatory Visit: Payer: BLUE CROSS/BLUE SHIELD

## 2018-06-28 ENCOUNTER — Encounter: Payer: BLUE CROSS/BLUE SHIELD | Admitting: Speech Pathology

## 2018-06-28 ENCOUNTER — Telehealth: Payer: Self-pay | Admitting: Physical Medicine & Rehabilitation

## 2018-06-28 NOTE — Telephone Encounter (Signed)
Claiborne Billings,  He might. We can potentially do it in  follow up , and then we can go from there/  April, can we try to get 300 u approved?  Thanks  Oval Linsey

## 2018-06-28 NOTE — Telephone Encounter (Signed)
-----   Message from Carey Bullocks sent at 06/27/2018  2:26 PM EST ----- Hi Dr. Naaman Plummer,   The above patient transferred here from [facility]. He has already been seen 17 out of 24 visits in their current POC. Not sure if we will be doing anything differently in O.T. based on their notes.  However, I was wondering if he would benefit from botox for wrist and finger flexors to allow for more wt bearing through RUE?   I know you saw him recently and his dad said that you had mentioned it. If this is a possibility, could your office call to schedule it. I feel it would be more beneficial while he is still current with O.T.   Thanks,  [employer]

## 2018-06-30 ENCOUNTER — Ambulatory Visit: Payer: BLUE CROSS/BLUE SHIELD | Admitting: Cardiovascular Disease

## 2018-06-30 ENCOUNTER — Encounter: Payer: Self-pay | Admitting: Cardiovascular Disease

## 2018-06-30 VITALS — BP 122/78 | HR 84 | Ht 66.0 in | Wt 215.5 lb

## 2018-06-30 DIAGNOSIS — I63412 Cerebral infarction due to embolism of left middle cerebral artery: Secondary | ICD-10-CM | POA: Diagnosis not present

## 2018-06-30 NOTE — Patient Instructions (Signed)
Medication Instructions:  Your physician recommends that you continue on your current medications as directed. Please refer to the Current Medication list given to you today.  Labwork: NONE  Testing/Procedures: NONE  Follow-Up: Your physician wants you to follow-up as needed with  Dr. Nishan.    If you need a refill on your cardiac medications before your next appointment, please call your pharmacy.    

## 2018-07-01 ENCOUNTER — Encounter: Payer: BLUE CROSS/BLUE SHIELD | Admitting: Occupational Therapy

## 2018-07-01 ENCOUNTER — Encounter: Payer: BLUE CROSS/BLUE SHIELD | Admitting: Speech Pathology

## 2018-07-01 ENCOUNTER — Ambulatory Visit: Payer: BLUE CROSS/BLUE SHIELD

## 2018-07-05 ENCOUNTER — Encounter: Payer: BLUE CROSS/BLUE SHIELD | Admitting: Speech Pathology

## 2018-07-05 ENCOUNTER — Telehealth: Payer: Self-pay | Admitting: *Deleted

## 2018-07-05 ENCOUNTER — Encounter: Payer: BLUE CROSS/BLUE SHIELD | Admitting: Occupational Therapy

## 2018-07-05 ENCOUNTER — Ambulatory Visit: Payer: BLUE CROSS/BLUE SHIELD

## 2018-07-05 NOTE — Telephone Encounter (Signed)
Patients father left a message asking for Dr. Naaman Plummer to write a script for physical therapy and speech therapy.  They are hoping to get accepted to the [facility] or a similar one at [facility].  I asked the father to please found out what additional items are needed.  I informed that Dr. Naaman Plummer is out of town and will return Monday.  Patient's father verbalized understanding.

## 2018-07-08 ENCOUNTER — Other Ambulatory Visit: Payer: Self-pay | Admitting: Physical Medicine & Rehabilitation

## 2018-07-08 ENCOUNTER — Other Ambulatory Visit: Payer: Self-pay | Admitting: Physical Medicine and Rehabilitation

## 2018-07-08 DIAGNOSIS — E539 Vitamin B deficiency, unspecified: Secondary | ICD-10-CM

## 2018-07-11 ENCOUNTER — Encounter: Payer: Self-pay | Admitting: Physical Medicine & Rehabilitation

## 2018-07-11 ENCOUNTER — Encounter: Payer: BLUE CROSS/BLUE SHIELD | Admitting: Speech Pathology

## 2018-07-11 ENCOUNTER — Encounter
Payer: BLUE CROSS/BLUE SHIELD | Attending: Physical Medicine & Rehabilitation | Admitting: Physical Medicine & Rehabilitation

## 2018-07-11 ENCOUNTER — Encounter: Payer: BLUE CROSS/BLUE SHIELD | Admitting: Occupational Therapy

## 2018-07-11 ENCOUNTER — Ambulatory Visit: Payer: BLUE CROSS/BLUE SHIELD

## 2018-07-11 ENCOUNTER — Other Ambulatory Visit: Payer: Self-pay

## 2018-07-11 VITALS — BP 143/94 | HR 104 | Ht 66.0 in | Wt 216.0 lb

## 2018-07-11 DIAGNOSIS — I63511 Cerebral infarction due to unspecified occlusion or stenosis of right middle cerebral artery: Secondary | ICD-10-CM | POA: Diagnosis not present

## 2018-07-11 DIAGNOSIS — N319 Neuromuscular dysfunction of bladder, unspecified: Secondary | ICD-10-CM | POA: Insufficient documentation

## 2018-07-11 DIAGNOSIS — I69391 Dysphagia following cerebral infarction: Secondary | ICD-10-CM | POA: Diagnosis not present

## 2018-07-11 DIAGNOSIS — K592 Neurogenic bowel, not elsewhere classified: Secondary | ICD-10-CM | POA: Diagnosis not present

## 2018-07-11 DIAGNOSIS — Z87891 Personal history of nicotine dependence: Secondary | ICD-10-CM | POA: Diagnosis not present

## 2018-07-11 DIAGNOSIS — G8191 Hemiplegia, unspecified affecting right dominant side: Secondary | ICD-10-CM | POA: Diagnosis not present

## 2018-07-11 DIAGNOSIS — I69320 Aphasia following cerebral infarction: Secondary | ICD-10-CM | POA: Insufficient documentation

## 2018-07-11 DIAGNOSIS — Z9889 Other specified postprocedural states: Secondary | ICD-10-CM | POA: Insufficient documentation

## 2018-07-11 DIAGNOSIS — I69351 Hemiplegia and hemiparesis following cerebral infarction affecting right dominant side: Secondary | ICD-10-CM | POA: Insufficient documentation

## 2018-07-11 NOTE — Progress Notes (Signed)
Subjective:    Patient ID: Xavier Villegas, male    DOB: 1981-03-22, 37 y.o.   MRN: [id number]  HPI   Xavier Villegas is here in follow up of his left MCA infarct and spastic left hemiparesis.  We transition him to outpatient therapies at [facility] neuro rehab after starting at [facility].  They ran into some insurance issues regarding coverage of visits which seems to have been triggered by the change in rehab venue.  Right now they are in a holding pattern as to when they can resume.  They may need some information for me as far as justification to continue with therapies.  Continues on baclofen for spasticity control but the right arm remains a big problem.  Therapy had contacted me regarding difficulties with pronation of the arm as well as tight wrist and finger flexors.  They thought that he might benefit from Botox which we discussed at his last visit.  He is using small base quad cane for ambulation.  He uses his AFO for support.  He has a right wrist hand orthosis uses for nighttime to help prevent further contracture.    Pain Inventory Average Pain 2 Pain Right Now 2 My pain is burning and dull  In the last 24 hours, has pain interfered with the following? General activity 3 Relation with others 3 Enjoyment of life 3 What TIME of day is your pain at its worst? daytime Sleep (in general) Fair  Pain is worse with: bending and some activites Pain improves with: medication Relief from Meds: 4  Mobility walk with assistance use a cane how many minutes can you walk? 10 ability to climb steps?  no do you drive?  no  Function disabled: date disabled 02/19/18 I need assistance with the following:  feeding, dressing, bathing, toileting, meal prep and household duties  Neuro/Psych weakness trouble walking spasms  Prior Studies Any changes since last visit?  no  Physicians involved in your care Any changes since last visit?  no   Family History  Problem Relation Age of Onset    . Stroke Maternal Grandmother   . Liver cancer Maternal Grandmother   . Stroke Maternal Grandfather   . Leukemia Mother        CLL/[facility]. Leretha Pol at [facility]  . Diabetes Father   . High blood pressure Father   . Liver cancer Paternal Grandmother   . Cerebral aneurysm Paternal Grandfather   . Stroke Paternal Grandfather    Social History   Socioeconomic History  . Marital status: Divorced    Spouse name: Not on file  . Number of children: Not on file  . Years of education: Not on file  . Highest education level: Not on file  Occupational History  . Not on file  Social Needs  . Financial resource strain: Not on file  . Food insecurity:    Worry: Not on file    Inability: Not on file  . Transportation needs:    Medical: Not on file    Non-medical: Not on file  Tobacco Use  . Smoking status: Former Smoker    Packs/day: 0.50    Years: 8.00    Pack years: 4.00    Types: Cigarettes    Last attempt to quit: 02/19/2018    Years since quitting: 0.3  . Smokeless tobacco: Never Used  Substance and Sexual Activity  . Alcohol use: Not Currently    Alcohol/week: 14.0 standard drinks    Types: 14 Cans of beer per  week  . Drug use: Never  . Sexual activity: Yes  Lifestyle  . Physical activity:    Days per week: Not on file    Minutes per session: Not on file  . Stress: Not on file  Relationships  . Social connections:    Talks on phone: Not on file    Gets together: Not on file    Attends religious service: Not on file    Active member of club or organization: Not on file    Attends meetings of clubs or organizations: Not on file    Relationship status: Not on file  Other Topics Concern  . Not on file  Social History Narrative  . Not on file   Past Surgical History:  Procedure Laterality Date  . CRANIOTOMY Left 02/21/2018   Procedure: DECOMPRESSIVE CRANIECTOMY with bone flap to abdomen;  Surgeon: [name], MD;  Location: [place];  Service: Neurosurgery;  Laterality: Left;   DECOMPRESSIVE CRANIECTOMY with bone flap to abdomen  . CRANIOTOMY N/A 05/02/2018   Procedure: RE-IMPLANTATION OF CRANIAL FLAP;  Surgeon: [name], MD;  Location: [place];  Service: Neurosurgery;  Laterality: N/A;  RE-IMPLANTATION OF CRANIAL FLAP  . IR ANGIO INTRA EXTRACRAN SEL COM CAROTID INNOMINATE UNI R MOD SED  02/19/2018  . IR ANGIO VERTEBRAL SEL VERTEBRAL BILAT MOD SED  02/19/2018  . IR CT HEAD LTD  02/19/2018  . IR PERCUTANEOUS ART THROMBECTOMY/INFUSION INTRACRANIAL INC DIAG ANGIO  02/19/2018  . IR US GUIDE VASC ACCESS RIGHT  02/19/2018  . RADIOLOGY WITH ANESTHESIA N/A 02/19/2018   Procedure: IR WITH ANESTHESIA CODE STROKE;  Surgeon: Corrie Mckusick, DO;  Location: [place];  Service: Anesthesiology;  Laterality: N/A;  . TEE WITHOUT CARDIOVERSION N/A 03/01/2018   Procedure: TRANSESOPHAGEAL ECHOCARDIOGRAM (TEE);  Surgeon: Sanda Klein, MD;  Location: [facility] ENDOSCOPY;  Service: Cardiovascular;  Laterality: N/A;   Past Medical History:  Diagnosis Date  . Aphasia   . GERD (gastroesophageal reflux disease)   . Hemiparesis ([place]) 02/2018   right side  . Hyperhomocysteinemia ([place])   . Neurogenic bladder 02/2018   04/26/18 inporving  . Spastic hemiparesis (HCC)    right side  . Stroke ([place])    Left MCA infart  . Tobacco abuse    BP (!) 143/94   Pulse (!) 104   Ht 5\' 6"  (1.676 m)   Wt 216 lb (98 kg)   SpO2 95%   BMI 34.86 kg/m   Opioid Risk Score:   Fall Risk Score:  `1  Depression screen PHQ 2/9  Depression screen [facility] 2/9 07/11/2018 04/18/2018  Decreased Interest 0 0  Down, Depressed, Hopeless 0 0  PHQ - 2 Score 0 0     Review of Systems  Constitutional: Negative.   HENT: Negative.   Eyes: Negative.   Respiratory: Negative.   Cardiovascular: Negative.   Gastrointestinal: Negative.   Endocrine: Negative.   Genitourinary: Negative.   Musculoskeletal: Negative.   Skin: Negative.   Allergic/Immunologic: Negative.   Neurological: Negative.   Hematological: Negative.     Psychiatric/Behavioral: Negative.   All other systems reviewed and are negative.      Objective:   Physical Exam General: No acute distress HEENT: EOMI, oral membranes moist Cards: reg rate  Chest: normal effort Abdomen: Soft, NT, ND Skin: dry, intact Extremities: no edema Neuro: Expressive aphasia.  Can communicate in short phrases and sentences.  Follows all simple commands with some apraxia at times.  Right upper extremity remains 1 out of 5  proximal distal.  He has increased tone in the hand pronators as well as the wrist and finger flexors which I would grade at 2-3 out of 4.  Biceps and triceps are 1 out of 4.  Trace to absent tone at the shoulder.  Right lower extremity grossly 2-3 out of 5 at the hip and knee.  Ankle dorsiflexion plantarflexion 1 out of 5.  Walks with a circumducted gait pattern.  Can, with some cueing bring the heel forward and strike with heel. Musculoskeletal:Full ROM, No pain with AROM or PROM in the neck, trunk, or extremities. Posture appropriate Psych:Pt's affect is appropriate. Pt is cooperative     Assessment & Plan:  Medical Problem List and Plan: 1. Right hemiparesis, dysphagia, and aphasia secondary to large left MCA infarct with hemorrhagic transformation s/p hemicraniectomy. -This patient is making progress from a functional standpoint and most definitely needs to continue in all phases of therapy including PT, OT, speech.  Outpatient venue would be the most ideal. 2.Spasticity: Right upper extremity most affected at this point. -continue baclofen 10mg  QID -continue splint/ROM without            Botox Injection for spasticity using needle EMG guidance Indication: spastic right hemiparesis   Dilution: 100 Units/ml        Total Units Injected: 300 Indication: Severe spasticity which interferes with ADL,mobility and/or  hygiene and is unresponsive to medication management and other conservative  care Informed consent was obtained after describing risks and benefits of the procedure with the patient. This includes bleeding, bruising, infection, excessive weakness, or medication side effects. A REMS form is on file and signed.  Needle: 61mm injectable monopolar needle electrode  Number of units per muscle Pectoralis Major 0 units Pectoralis Minor 0 units Biceps 0 units Brachioradialis 0 units FCR 25 units FCU 25 units FDS 75 units FDP 75 units FPL 25 units Pronator Teres 75 units Pronator Quadratus 0 units  All injections were done after obtaining appropriate EMG activity and after negative drawback for blood. The patient tolerated the procedure well. Post procedure instructions were given.  . 4. High homocysteine levels/low folate levels/boderline low X54:[id number] dose folic supplement, B6 and received B12 inj 7/14 . MRHFR--heterozygous /single mutation ID.  -hematologist follow upmight be required. I recommend that he discuss with neurology first. 5. Neurogenic bladder/bowel:improved -incontinence bladder improved   Follow up with me in 2 MONTHS.  15 minutes of face to face patient care time were spent during this visit. All questions were encouraged and answered.

## 2018-07-11 NOTE — Patient Instructions (Signed)
PLEASE FEEL FREE TO CALL OUR OFFICE WITH ANY PROBLEMS OR QUESTIONS ([phone])      

## 2018-07-11 NOTE — Telephone Encounter (Signed)
I am happy to write rx for therapy but we need to know where to send. If they want me to write "blank" rx'es for therapy, we can leave them here for pick up. Just let me know.

## 2018-07-12 ENCOUNTER — Encounter: Payer: BLUE CROSS/BLUE SHIELD | Admitting: Occupational Therapy

## 2018-07-12 ENCOUNTER — Ambulatory Visit: Payer: BLUE CROSS/BLUE SHIELD | Admitting: Physical Therapy

## 2018-07-12 NOTE — Telephone Encounter (Signed)
Spoke with outpatient Neuro and patient has 16 PT,OT visits for the calender year which runs Aug 1, 19 to March 10 2019.  Patient has 2 ST visits left.  Outpatient Neuro will reach out to patient to get those scheduled.

## 2018-07-13 ENCOUNTER — Encounter: Payer: BLUE CROSS/BLUE SHIELD | Admitting: Occupational Therapy

## 2018-07-13 ENCOUNTER — Ambulatory Visit: Payer: BLUE CROSS/BLUE SHIELD

## 2018-07-13 ENCOUNTER — Encounter: Payer: BLUE CROSS/BLUE SHIELD | Admitting: Speech Pathology

## 2018-07-14 ENCOUNTER — Ambulatory Visit: Payer: BLUE CROSS/BLUE SHIELD | Admitting: Rehabilitative and Restorative Service Providers"

## 2018-07-14 ENCOUNTER — Encounter: Payer: BLUE CROSS/BLUE SHIELD | Admitting: Occupational Therapy

## 2018-07-18 ENCOUNTER — Encounter: Payer: BLUE CROSS/BLUE SHIELD | Admitting: Speech Pathology

## 2018-07-18 ENCOUNTER — Encounter: Payer: BLUE CROSS/BLUE SHIELD | Admitting: Occupational Therapy

## 2018-07-18 ENCOUNTER — Ambulatory Visit: Payer: BLUE CROSS/BLUE SHIELD | Admitting: Physical Therapy

## 2018-07-18 ENCOUNTER — Ambulatory Visit: Payer: BLUE CROSS/BLUE SHIELD

## 2018-07-19 ENCOUNTER — Encounter: Payer: Self-pay | Admitting: Family Medicine

## 2018-07-19 ENCOUNTER — Ambulatory Visit: Payer: BLUE CROSS/BLUE SHIELD | Admitting: Family Medicine

## 2018-07-19 VITALS — BP 120/80 | HR 92 | Temp 98.4°F | Ht 66.0 in | Wt 218.8 lb

## 2018-07-19 DIAGNOSIS — I63412 Cerebral infarction due to embolism of left middle cerebral artery: Secondary | ICD-10-CM

## 2018-07-19 DIAGNOSIS — G8110 Spastic hemiplegia affecting unspecified side: Secondary | ICD-10-CM | POA: Diagnosis not present

## 2018-07-19 DIAGNOSIS — I63512 Cerebral infarction due to unspecified occlusion or stenosis of left middle cerebral artery: Secondary | ICD-10-CM

## 2018-07-19 DIAGNOSIS — I69331 Monoplegia of upper limb following cerebral infarction affecting right dominant side: Secondary | ICD-10-CM | POA: Diagnosis not present

## 2018-07-19 DIAGNOSIS — R4701 Aphasia: Secondary | ICD-10-CM

## 2018-07-19 NOTE — Progress Notes (Signed)
Subjective:    Patient ID: Xavier Villegas, male    DOB: [birth year], 37 y.o.   MRN: [id number]  HPI  Presents to clinic for 3 month follow up after his CVA and having skull bone that was removed for his initial craniotomy placed back onto head.  Patient continues to have issues with muscle spasms, and hemiplegia following the CVA.  He uses a quad cane to help with his balance.  Patient continues to work with physical therapy.  Patient's father states that the physical therapy through the insurance is about to run out, so he would like a new referral so we can make sure patient continues to get physical therapy services.  Patient also continues to struggle with expressive aphasia, he is working on using more words every day.  Patient has been doing very well since skull bone was replaced back in head in September 2019.  Patient is pleased that he does not have to wear the helmet anymore.  Denies any fever or chills.  Denies chest pain, shortness of breath or wheezing.  Denies abdominal pain, nausea or vomiting or diarrhea.  BP has been stable.   Patient continues to use baclofen to help muscle spasms with good success.    Patient Active Problem List   Diagnosis Date Noted  . CVA (cerebral vascular accident) ([place]) 05/02/2018  . Neurogenic bowel   . Neurogenic bladder   . Spastic hemiparesis ([place])   . 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 Cerebral infarction due to embolism of left middle cerebral artery (HCC) s/p thrombectomy 03/01/2018  . Carotid artery dissection ([place]) Left 03/01/2018  . Acute ischemic right MCA stroke ([place]) 03/01/2018  . Right hemiparesis ([place])   . Dysphagia, post-stroke   . Global aphasia   . Leukocytosis   . Folic acid deficiency 75/17/0017  . B12 deficiency 02/23/2018  . Hyperhomocysteinemia ([place]) 02/23/2018  . Smoker    Social History   Tobacco Use  . Smoking status: Former Smoker   Packs/day: 0.50    Years: 8.00    Pack years: 4.00    Types: Cigarettes    Last attempt to quit: 02/19/2018    Years since quitting: 0.4  . Smokeless tobacco: Never Used  Substance Use Topics  . Alcohol use: Not Currently    Alcohol/week: 14.0 standard drinks    Types: 14 Cans of beer per week   Review of Systems  Constitutional: Negative for chills, fatigue and fever.  HENT: Negative for congestion, ear pain, sinus pain and sore throat.   Eyes: Negative.   Respiratory: Negative for cough, shortness of breath and wheezing.   Cardiovascular: Negative for chest pain, palpitations and leg swelling.  Gastrointestinal: Negative for abdominal pain, diarrhea, nausea and vomiting.  Genitourinary: Negative for dysuria, frequency and urgency.  Musculoskeletal: Negative for arthralgias and myalgias.  Skin: Negative for color change, pallor and rash.  Neurological: Negative for syncope, light-headedness and headaches.  Psychiatric/Behavioral: The patient is not nervous/anxious.       Objective:   Physical Exam Constitutional: He appears well-developed and well-nourished. No distress.  HENT:  Head: Normocephalic and atraumatic.  Eyes: Pupils are equal, round, and reactive to light. Conjunctivae and EOM are normal. No scleral icterus.  Neck: Normal range of motion. Neck supple. No tracheal deviation present.  Cardiovascular: Normal rate, regular rhythm and normal heart sounds.  Pulmonary/Chest: Effort normal and breath sounds normal. No respiratory distress. He has no wheezes. He  has no rales.  Abdominal: Soft. Bowel sounds are normal. There is no tenderness.  Neurological: He is alert and oriented to person, place, and time.  Walks with a quad cane.  Has aphasia.  Right lower extremity strength has returned quite well, patient continues to have right upper extremity weakness.  Scar on left side of head from craniotomy has healed well. Skin: Skin is warm and dry. He is not diaphoretic. No  pallor.  Psychiatric: He has a normal mood and affect. His behavior is normal. Thought content normal.  Nursing note and vitals reviewed.   Vitals:   07/19/18 1352  BP: 120/80  Pulse: 92  Temp: 98.4 F (36.9 C)  SpO2: 98%    Assessment & Plan:   Acute ischemic left MCA stroke, monoplegia of upper extremity affecting right dominant side, Spastic hemiparesis, cerebral infarction due to embolism of left middle cerebral artery to, global aphasia - overall since having stroke patient has done very well.  He will continue to follow regularly with Dr. Tessa Lerner and Dr. Saintclair Halsted who are his neurologist and neurosurgeon.  I will put in a new physical therapy referral so patient can continue working on improving his strength and coordination.  Blood pressure is stable, muscle spasms are controlled with use of baclofen.  Patient will follow-up here in approximately 6 months for recheck.  Patient and father both aware they can return to clinic sooner if any issues arise or if anything is needed.  Father also aware that if he needs any information from [place] regarding patient's long-term disability application please let us know.

## 2018-07-20 ENCOUNTER — Ambulatory Visit: Payer: BLUE CROSS/BLUE SHIELD

## 2018-07-20 ENCOUNTER — Encounter: Payer: BLUE CROSS/BLUE SHIELD | Admitting: Speech Pathology

## 2018-07-20 ENCOUNTER — Encounter: Payer: BLUE CROSS/BLUE SHIELD | Admitting: Occupational Therapy

## 2018-07-21 ENCOUNTER — Encounter: Payer: BLUE CROSS/BLUE SHIELD | Admitting: Occupational Therapy

## 2018-07-21 ENCOUNTER — Ambulatory Visit: Payer: BLUE CROSS/BLUE SHIELD | Admitting: Rehabilitative and Restorative Service Providers"

## 2018-07-25 ENCOUNTER — Encounter: Payer: BLUE CROSS/BLUE SHIELD | Admitting: Speech Pathology

## 2018-07-25 ENCOUNTER — Encounter: Payer: BLUE CROSS/BLUE SHIELD | Admitting: Occupational Therapy

## 2018-07-25 ENCOUNTER — Ambulatory Visit: Payer: BLUE CROSS/BLUE SHIELD

## 2018-07-26 ENCOUNTER — Ambulatory Visit: Payer: BLUE CROSS/BLUE SHIELD | Admitting: Physical Therapy

## 2018-07-26 ENCOUNTER — Encounter: Payer: BLUE CROSS/BLUE SHIELD | Admitting: Occupational Therapy

## 2018-07-26 ENCOUNTER — Ambulatory Visit: Payer: BLUE CROSS/BLUE SHIELD | Attending: Family Medicine

## 2018-07-26 DIAGNOSIS — R2689 Other abnormalities of gait and mobility: Secondary | ICD-10-CM | POA: Diagnosis not present

## 2018-07-26 DIAGNOSIS — R482 Apraxia: Secondary | ICD-10-CM | POA: Insufficient documentation

## 2018-07-26 DIAGNOSIS — M6281 Muscle weakness (generalized): Secondary | ICD-10-CM | POA: Insufficient documentation

## 2018-07-26 DIAGNOSIS — R4701 Aphasia: Secondary | ICD-10-CM | POA: Diagnosis not present

## 2018-07-26 DIAGNOSIS — I69351 Hemiplegia and hemiparesis following cerebral infarction affecting right dominant side: Secondary | ICD-10-CM | POA: Insufficient documentation

## 2018-07-26 DIAGNOSIS — R208 Other disturbances of skin sensation: Secondary | ICD-10-CM | POA: Insufficient documentation

## 2018-07-26 DIAGNOSIS — R278 Other lack of coordination: Secondary | ICD-10-CM | POA: Diagnosis not present

## 2018-07-26 DIAGNOSIS — R2681 Unsteadiness on feet: Secondary | ICD-10-CM | POA: Insufficient documentation

## 2018-07-26 DIAGNOSIS — M25511 Pain in right shoulder: Secondary | ICD-10-CM | POA: Insufficient documentation

## 2018-07-26 NOTE — Therapy (Signed)
[facility] [address] [place], [place], [address] Phone: [phone]   Fax:  [phone]  Speech Language Pathology Treatment  Patient Details  Name: Sarvesh Meddaugh MRN: [id number] Date of Birth: 1980/08/29 Referring Provider (SLP): Bary Leriche    Encounter Date: 07/26/2018  End of Session - 07/26/18 1712    Visit Number  20    Number of Visits  25    Date for SLP Re-Evaluation  07/13/18    SLP Start Time  1406    SLP Stop Time   1446    SLP Time Calculation (min)  40 min    Activity Tolerance  Patient tolerated treatment well       Past Medical History:  Diagnosis Date  . Aphasia   . GERD (gastroesophageal reflux disease)   . Hemiparesis ([place]) 02/2018   right side  . Hyperhomocysteinemia ([place])   . Neurogenic bladder 02/2018   04/26/18 inporving  . Spastic hemiparesis (HCC)    right side  . Stroke ([place])    Left MCA infart  . Tobacco abuse     Past Surgical History:  Procedure Laterality Date  . CRANIOTOMY Left 02/21/2018   Procedure: DECOMPRESSIVE CRANIECTOMY with bone flap to abdomen;  Surgeon: [name], MD;  Location: [place];  Service: Neurosurgery;  Laterality: Left;  DECOMPRESSIVE CRANIECTOMY with bone flap to abdomen  . CRANIOTOMY N/A 05/02/2018   Procedure: RE-IMPLANTATION OF CRANIAL FLAP;  Surgeon: [name], MD;  Location: [place];  Service: Neurosurgery;  Laterality: N/A;  RE-IMPLANTATION OF CRANIAL FLAP  . IR ANGIO INTRA EXTRACRAN SEL COM CAROTID INNOMINATE UNI R MOD SED  02/19/2018  . IR ANGIO VERTEBRAL SEL VERTEBRAL BILAT MOD SED  02/19/2018  . IR CT HEAD LTD  02/19/2018  . IR PERCUTANEOUS ART THROMBECTOMY/INFUSION INTRACRANIAL INC DIAG ANGIO  02/19/2018  . IR US GUIDE VASC ACCESS RIGHT  02/19/2018  . RADIOLOGY WITH ANESTHESIA N/A 02/19/2018   Procedure: IR WITH ANESTHESIA CODE STROKE;  Surgeon: Corrie Mckusick, DO;  Location: [place];  Service: Anesthesiology;  Laterality: N/A;  . TEE  WITHOUT CARDIOVERSION N/A 03/01/2018   Procedure: TRANSESOPHAGEAL ECHOCARDIOGRAM (TEE);  Surgeon: Sanda Klein, MD;  Location: [facility] ENDOSCOPY;  Service: Cardiovascular;  Laterality: N/A;    There were no vitals filed for this visit.  Subjective Assessment - 07/26/18 1409    Subjective  "Yah, hmmm..." Re: "How was the weekend?" SLP believes pt has exhausted his ST visits for his plan year however pt father would like pt to cont to be seen today.    Currently in Pain?  No/denies            ADULT SLP TREATMENT - 07/26/18 1410      General Information   Behavior/Cognition  Alert;Cooperative;Pleasant mood      Treatment Provided   Treatment provided  Cognitive-Linquistic      Cognitive-Linquistic Treatment   Treatment focused on  Aphasia;Apraxia    Skilled Treatment  Pt demonstrated understanding of simple conversation re: movies [employer]) with extra time rarely needed. Object naming: pt with max/total cues including repetition with minimal (5%) success, apraxic responses noted. With cloze cues and SLP and pt simultaneous gestures pt responses were more accurate however still not functional.  Pt with crude gestures likely due to motor apraxia, as well as perseverative and crude drawings - these two compensatory measures will need much remediation to prove to be feasible options for pt to communicate. In rote speech (days  of week) pt req'd SLP simultaneous production faded to just visual oral cues for initial sounds from SLP for 5/7 functional production of the days. SLP told pt and father to cont to work on same expressive tasks at home.       Assessment / Recommendations / Plan   Plan  Continue with current plan of care      Progression Toward Goals   Progression toward goals  Progressing toward goals   ? feasibility of drawing and gesture compensations          SLP Long Term Goals - 07/26/18 1714      SLP LONG TERM GOAL #1   Title  Patient will name objects using  multi-modal strategies with 80% accuracy.    Time  3    Period  Weeks    Status  On-going      SLP LONG TERM GOAL #2   Title  Patient will read aloud or repeat words, maintaining phonemic accuracy, with 80% accuracy.      Time  3    Period  Weeks    Status  On-going      SLP LONG TERM GOAL #3   Title  Patient will complete 3 unit processing tasks with 80% accuracy without the need of repetition of task instructions or significant delays in responding.    Time  3    Period  Weeks    Status  On-going      SLP LONG TERM GOAL #4   Title  Patient will demonstrate reading comprehension for words and phrases with 80% accuracy.     Time  3    Period  Weeks    Status  On-going       Plan - 07/26/18 1713    Clinical Impression Statement  Pt appears eager to work on his communication, with persistent severe expressive language/apraxia and mod-severe recpetive aphasia. Pt compensation attempts with drawing and gesturing were largely crude and perseverative; SLP suspects these will likely not be feasible long-term options for pt to achieve functional communication. SLP recommends continued ST with focus on current goals.     Speech Therapy Frequency  2x / week    Duration  Other (comment)   12 weeks   Treatment/Interventions  Language facilitation;Multimodal communcation approach;Patient/family education;SLP instruction and feedback;Compensatory techniques;Internal/external aids;Functional tasks;Cueing hierarchy    Potential to Achieve Goals  Good    Potential Considerations  Ability to learn/carryover information;Pain level;Family/community support;Co-morbidities;Previous level of function;Cooperation/participation level;Severity of impairments;Medical prognosis    Consulted and Agree with Plan of Care  Patient       Patient will benefit from skilled therapeutic intervention in order to improve the following deficits and impairments:   Aphasia  Apraxia    Problem List Patient Active  Problem List   Diagnosis Date Noted  . CVA (cerebral vascular accident) ([place]) 05/02/2018  . Neurogenic bowel   . Neurogenic bladder   . Spastic hemiparesis ([place])   . 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 Cerebral infarction due to embolism of left middle cerebral artery (HCC) s/p thrombectomy 03/01/2018  . Carotid artery dissection ([place]) Left 03/01/2018  . Acute ischemic right MCA stroke ([place]) 03/01/2018  . Right hemiparesis ([place])   . Dysphagia, post-stroke   . Global aphasia   . Leukocytosis   . Folic acid deficiency 30/86/5784  . B12 deficiency 02/23/2018  . Hyperhomocysteinemia ([place]) 02/23/2018  . Smoker     SCHINKE,CARL ,  MS, CCC-SLP  07/26/2018, 5:15 PM  [facility] [address] [place], [place], [address] Phone: [phone]   Fax:  [phone]   Name: Maricela Kawahara MRN: [id number] Date of Birth: 1980/12/31

## 2018-07-26 NOTE — Therapy (Signed)
[facility] [address] [place], [place], [address] Phone: [phone]   Fax:  [phone]  Patient Details  Name: Xavier Villegas MRN: [id number] Date of Birth: 08/03/1981 Referring Provider:  Jodelle Green, FNP  Encounter Date: 07/26/2018  Patient arrived for PT with his father. PT, father & pt discussed insurance issues. Father reports that he spoke with insurance, Dr. Naaman Plummer office spoke with them and [employer] person spoke with them. His insurance is reporting that his Authorization period is August 1 - July 31. $3250 deductible & [phone] oop met fully. VL: PT & OT 30 with 14 used (if seen same day counts as one visit)  ST VL 15 with 13 used.  PT explained that [facility] therapy notes indicate he was seen 18 times by each discipline which concerns PT that number used may not be accurate. Father feels confident that PT & OT have 16 visits left & ST 2 visits since insurance is telling everyone the same thing.  Since patient was not scheduled for OT today, he was not seen for treatment by PT.  Plan: check LTGs and do recertification to cover 16 visits.    Jamey Reas PT, DPT 07/26/2018, 3:08 PM  [facility] [address] [place], [place], [address] Phone: [phone]   Fax:  [phone]

## 2018-07-27 ENCOUNTER — Ambulatory Visit: Payer: BLUE CROSS/BLUE SHIELD

## 2018-07-27 ENCOUNTER — Encounter: Payer: BLUE CROSS/BLUE SHIELD | Admitting: Speech Pathology

## 2018-07-27 ENCOUNTER — Telehealth: Payer: Self-pay | Admitting: Physical Medicine & Rehabilitation

## 2018-07-27 ENCOUNTER — Encounter: Payer: BLUE CROSS/BLUE SHIELD | Admitting: Occupational Therapy

## 2018-07-27 ENCOUNTER — Telehealth: Payer: Self-pay | Admitting: Lab

## 2018-07-27 NOTE — Telephone Encounter (Signed)
Called Pt and spoke with his dad about the [facility], and that he probably won't hear anything back until at least 6 weeks for a initial visit. Pt's dad said okay and Thanks for letting him know.

## 2018-07-27 NOTE — Telephone Encounter (Signed)
Patient's father is calling to see if we can get patient into [facility] for Speech Therapy.  He only has 1 more visit left with his insurance to go to out outpatient Neuro office.  He is on the waiting list at [facility] and it could be several weeks before he can get in there.  I have left a voicemail at [facility] to find out what we may need and also to see if he would qualify for their services.  Dr. Naaman Plummer if we need an order can you do one in Epic for this patient?  I wll let you know once I have heard from them.  Thanks.

## 2018-07-27 NOTE — Telephone Encounter (Signed)
He should not need an order for [facility]. I have never written one for anybody before. They just have to apply to program.

## 2018-07-28 ENCOUNTER — Encounter: Payer: Self-pay | Admitting: Physical Therapy

## 2018-07-28 ENCOUNTER — Ambulatory Visit: Payer: BLUE CROSS/BLUE SHIELD | Admitting: Physical Therapy

## 2018-07-28 ENCOUNTER — Ambulatory Visit: Payer: BLUE CROSS/BLUE SHIELD | Admitting: Occupational Therapy

## 2018-07-28 ENCOUNTER — Ambulatory Visit: Payer: BLUE CROSS/BLUE SHIELD | Admitting: Rehabilitative and Restorative Service Providers"

## 2018-07-28 ENCOUNTER — Encounter: Payer: BLUE CROSS/BLUE SHIELD | Admitting: Speech Pathology

## 2018-07-28 ENCOUNTER — Encounter: Payer: BLUE CROSS/BLUE SHIELD | Admitting: Occupational Therapy

## 2018-07-28 DIAGNOSIS — M6281 Muscle weakness (generalized): Secondary | ICD-10-CM

## 2018-07-28 DIAGNOSIS — R278 Other lack of coordination: Secondary | ICD-10-CM

## 2018-07-28 DIAGNOSIS — I69351 Hemiplegia and hemiparesis following cerebral infarction affecting right dominant side: Secondary | ICD-10-CM

## 2018-07-28 DIAGNOSIS — R208 Other disturbances of skin sensation: Secondary | ICD-10-CM | POA: Diagnosis not present

## 2018-07-28 DIAGNOSIS — R4701 Aphasia: Secondary | ICD-10-CM | POA: Diagnosis not present

## 2018-07-28 DIAGNOSIS — R2689 Other abnormalities of gait and mobility: Secondary | ICD-10-CM | POA: Diagnosis not present

## 2018-07-28 DIAGNOSIS — R2681 Unsteadiness on feet: Secondary | ICD-10-CM

## 2018-07-28 DIAGNOSIS — M25511 Pain in right shoulder: Secondary | ICD-10-CM | POA: Diagnosis not present

## 2018-07-28 DIAGNOSIS — R482 Apraxia: Secondary | ICD-10-CM | POA: Diagnosis not present
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
## 2018-07-28 NOTE — Telephone Encounter (Signed)
I have called father and told him to reach out to [facility] to apply for the program.  He will do so, and appreciates the information and help with this.
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
## 2018-08-01 ENCOUNTER — Ambulatory Visit: Payer: BLUE CROSS/BLUE SHIELD | Admitting: Physical Therapy

## 2018-08-01 ENCOUNTER — Encounter: Payer: BLUE CROSS/BLUE SHIELD | Admitting: Occupational Therapy

## 2018-08-02 ENCOUNTER — Ambulatory Visit: Payer: BLUE CROSS/BLUE SHIELD

## 2018-08-02 ENCOUNTER — Ambulatory Visit: Payer: BLUE CROSS/BLUE SHIELD | Admitting: Occupational Therapy

## 2018-08-02 ENCOUNTER — Encounter: Payer: Self-pay | Admitting: Occupational Therapy

## 2018-08-02 ENCOUNTER — Encounter: Payer: BLUE CROSS/BLUE SHIELD | Admitting: Speech Pathology

## 2018-08-02 ENCOUNTER — Encounter: Payer: Self-pay | Admitting: Physical Therapy

## 2018-08-02 ENCOUNTER — Ambulatory Visit: Payer: BLUE CROSS/BLUE SHIELD | Admitting: Physical Therapy

## 2018-08-02 DIAGNOSIS — R2689 Other abnormalities of gait and mobility: Secondary | ICD-10-CM

## 2018-08-02 DIAGNOSIS — M6281 Muscle weakness (generalized): Secondary | ICD-10-CM

## 2018-08-02 DIAGNOSIS — R4701 Aphasia: Secondary | ICD-10-CM

## 2018-08-02 DIAGNOSIS — R208 Other disturbances of skin sensation: Secondary | ICD-10-CM | POA: Diagnosis not present

## 2018-08-02 DIAGNOSIS — R482 Apraxia: Secondary | ICD-10-CM | POA: Diagnosis not present

## 2018-08-02 DIAGNOSIS — R2681 Unsteadiness on feet: Secondary | ICD-10-CM | POA: Diagnosis not present

## 2018-08-02 DIAGNOSIS — M25511 Pain in right shoulder: Secondary | ICD-10-CM

## 2018-08-02 DIAGNOSIS — R278 Other lack of coordination: Secondary | ICD-10-CM

## 2018-08-02 DIAGNOSIS — I69351 Hemiplegia and hemiparesis following cerebral infarction affecting right dominant side: Secondary | ICD-10-CM

## 2018-08-02 NOTE — Therapy (Signed)
[facility] [address] [place], [place], [address] Phone: [phone]   Fax:  [phone]  Physical Therapy Treatment  Patient Details  Name: Xavier Villegas MRN: [id number] Date of Birth: 07/28/1981 Referring Provider (PT): Reesa Chew   Encounter Date: 08/02/2018  PT End of Session - 08/02/18 1511    Visit Number  20    Number of Visits  34    Authorization Type  [employer] $3250 deductible & $6350oop max have been met. Pt is covered 100%    Authorization Time Period  VL: PT & OT 30 with insurance calendar year Aug 1-July 31 with 14 reported used. However visit count [facility] notes 18 for both PT & OT.  If PT & OT same day counts as one    Authorization - Visit Number  2    Authorization - Number of Visits  16    PT Start Time  1115    PT Stop Time  1316    PT Time Calculation (min)  42 min    Equipment Utilized During Treatment  Gait belt    Activity Tolerance  Patient tolerated treatment well    Behavior During Therapy  WFL for tasks assessed/performed       Past Medical History:  Diagnosis Date  . Aphasia   . GERD (gastroesophageal reflux disease)   . Hemiparesis ([place]) 02/2018   right side  . Hyperhomocysteinemia ([place])   . Neurogenic bladder 02/2018   04/26/18 inporving  . Spastic hemiparesis (HCC)    right side  . Stroke ([place])    Left MCA infart  . Tobacco abuse     Past Surgical History:  Procedure Laterality Date  . CRANIOTOMY Left 02/21/2018   Procedure: DECOMPRESSIVE CRANIECTOMY with bone flap to abdomen;  Surgeon: [name], MD;  Location: [facility];  Service: Neurosurgery;  Laterality: Left;  DECOMPRESSIVE CRANIECTOMY with bone flap to abdomen  . CRANIOTOMY N/A 05/02/2018   Procedure: RE-IMPLANTATION OF CRANIAL FLAP;  Surgeon: [name], MD;  Location: [facility];  Service: Neurosurgery;  Laterality: N/A;  RE-IMPLANTATION OF CRANIAL FLAP  . IR ANGIO INTRA EXTRACRAN SEL COM CAROTID INNOMINATE UNI R MOD  SED  02/19/2018  . IR ANGIO VERTEBRAL SEL VERTEBRAL BILAT MOD SED  02/19/2018  . IR CT HEAD LTD  02/19/2018  . IR PERCUTANEOUS ART THROMBECTOMY/INFUSION INTRACRANIAL INC DIAG ANGIO  02/19/2018  . IR US GUIDE VASC ACCESS RIGHT  02/19/2018  . RADIOLOGY WITH ANESTHESIA N/A 02/19/2018   Procedure: IR WITH ANESTHESIA CODE STROKE;  Surgeon: Corrie Mckusick, DO;  Location: [facility];  Service: Anesthesiology;  Laterality: N/A;  . TEE WITHOUT CARDIOVERSION N/A 03/01/2018   Procedure: TRANSESOPHAGEAL ECHOCARDIOGRAM (TEE);  Surgeon: Sanda Klein, MD;  [facility];  Service: Cardiovascular;  Laterality: N/A;    There were no vitals filed for this visit.  Subjective Assessment - 08/02/18 1239    Subjective  No reported falls or changes since last visit.  Pt brings in his night splint and printouts of OT exercises.    Pertinent History  Xavier Villegas  had left MCA infarct and subsequent craniectomy.  He was discharged from rehab several weeks ago. He has been home with his family  He is having occasional spasms on the right side still.Patient has Right hemiparesis, dysphagia, and aphasia secondary to large left MCA infarct with hemorrhagic transformation s/p hemicraniectomy.He had acute ischemic left MCA infarct and subsequent craniotomy - was first admitted on 03/01/18.  He was discharged  from hospital on 04/06/18    Patient Stated Goals  to walk without AD and live on his own.    Currently in Pain?  No/denies                       [facility] Adult PT Treatment/Exercise - 08/02/18 0001      Transfers   Transfers  Sit to Stand;Stand to Sit    Sit to Stand  5: Supervision;4: Min guard;With upper extremity assist;From bed    Sit to Stand Details (indicate cue type and reason)  5 reps for sit<>stand with equal weightbearing through lower extremities, then 10 reps with RLE posterior to RLE, for increased RLE weightbearing.  Pt requires additional initial assistance for increased anterior lean, cues  for glut activation upon standing for upright posture.    Stand to Sit  5: Supervision;4: Min guard;With upper extremity assist;To bed      Ambulation/Gait   Ambulation/Gait  Yes    Ambulation/Gait Assistance  4: Min assist    Ambulation/Gait Assistance Details  Transitioned to use of SPC, with therapist providing cues through pelvis to weightshift to RLE, increased RLE stance time; VCs to increase LLE step length    Ambulation Distance (Feet)  120 Feet   x 2,then 80 ft pt's LUE on PT's shoulder with gait   Assistive device  Large base quad cane;Straight cane;Other (Comment);None    Gait Pattern  Step-through pattern;Decreased arm swing - right;Decreased step length - right;Decreased stance time - right;Decreased stride length;Decreased weight shift to right;Right hip hike;Right steppage;Right foot flat;Right flexed knee in stance;Antalgic;Lateral hip instability;Trunk flexed;Abducted- right    Ambulation Surface  Level;Indoor    Pre-Gait Activities  Used parallel bars and mirror for visual cues to increase RLE weightshifting:  forward/back gait 3 laps in parallel bars with manual cues at R hips for hip elevation to promote hip flexion for improved foot clearance; also tactile cues to prevent R knee recurvatum.    Gait Comments  Looked at pt's AFO and he does have a heel wedge currently in his shoe (wondering based on his recurvatum)      High Level Balance   High Level Balance Comments  RLE as stance with LLE marching x 10 reps, RLE as stance with LLE hip abduction (manual cues to prevent R knee recurvatum); RLE as stance, with LLE propped at 4" block, 5 reps x 10 seconds, cues for glut activation with RLE in stance.  Forward step lunge RLE 5 reps; then RLE as stance with L forward lunge x 5 reps      Neuro Re-ed    Neuro Re-ed Details   In parallel bars:   See above for SLS activities              PT Short Term Goals - 07/28/18 1313      PT SHORT TERM GOAL #1   Title  Sit to/from  stand chairs without UE assist with supervision    Status  New    Target Date  09/02/18      PT SHORT TERM GOAL #2   Title  Patient ambulates with straight cane scanning environment without LOB maintaining path with supervision.     Status  New    Target Date  09/02/18      PT SHORT TERM GOAL #3   Title  Patient ambulates with straight cane & AFO >2.00 ft/sec    Status  Revised    Target Date  09/02/18        PT Long Term Goals - 07/28/18 1303      PT LONG TERM GOAL #1   Title  Patient will increase six minute walk test distance to >1000 for progression to community ambulator and improve gait ability    Baseline  585 feet 05/10/18: 522f 10/31: 6467f   Time  12    Period  Weeks    Status  On-going    Target Date  09/30/18      PT LONG TERM GOAL #2   Title  Patient will increase 10 meter walk test to >3.28 ft/sec (1.23m63m as to improve gait speed for better community ambulation and to reduce fall risk.    Baseline  1.57 ft/sec (0.48 m/sec) 05/10/18: 2.33 ft/sec (0.79m95m10/31: 2.53 ft/sec (0.51m/93m12/19: 1.87 ft/sec cane quad tip & 1.65 ft/sec no AD minA    Time  12    Period  Weeks    Status  On-going    Target Date  09/30/18      PT LONG TERM GOAL #3   Title  Patient will increase Berg Balance score by >45/56 to demonstrate decreased fall risk during functional activities.    Baseline  25/56 05/10/18: 36/56 10/31: 39/56  12/19: 39/56    Time  12    Period  Weeks    Status  Revised    Target Date  09/30/18      PT LONG TERM GOAL #4   Title  Patient will be require no assist with ascend/descend 3 steps with single rail including on right side modified independent    Baseline  12/19: supervision with rail on left side only (which requires 2 rails to be on left side ascend & descend)    Time  12    Period  Weeks    Status  Revised    Target Date  09/30/18      PT LONG TERM GOAL #5   Title  Dynamic Gait Index with cane quad tip >12/24    Baseline  12/19: 4/24     Status  New    Target Date  09/30/18            Plan - 08/02/18 1512    Clinical Impression Statement  Skilled PT session today focused on SLS and gait activities to increased weightshift to RLE for improved RLE stance time and LLE step length.  Pt needs multi-modal cues for improved RLE weightshift; when he does this, he has significant R knee recurvatum.  Pt reports not being able to notice this.  He brought in pictures of OT HEP, so unsure what his PT exercises for HEP are.    Rehab Potential  Good    PT Frequency  2x / week   16 visits   PT Duration  8 weeks    PT Treatment/Interventions  Manual techniques;Patient/family education;Neuromuscular re-education;Balance training;Functional mobility training;Therapeutic activities;Therapeutic exercise;Gait training;Orthotic Fit/Training;Passive range of motion;ADLs/Self Care Home Management;Electrical Stimulation;Stair training;Energy conservation;Taping;DME Instruction    PT Next Visit Plan  pt to bring current HEP with PT plans to update? (brought in OT ones this visit, so may just need to start again with HEP for SLS and RLE stregnthening/weightbearing). Gait training with straight cane normal tip. Work towards updated STGs.    PT Home Exercise Plan  standing abd bilaterally x 15    Consulted and Agree with Plan of Care  Patient       Patient will benefit  from skilled therapeutic intervention in order to improve the following deficits and impairments:  Abnormal gait, Decreased balance, Decreased endurance, Decreased mobility, Difficulty walking, Impaired sensation, Increased muscle spasms, Impaired UE functional use, Impaired flexibility, Decreased strength, Decreased coordination, Decreased activity tolerance  Visit Diagnosis: Other abnormalities of gait and mobility  Unsteadiness on feet  Muscle weakness (generalized)     Problem List Patient Active Problem List   Diagnosis Date Noted  . CVA (cerebral vascular accident) ([place])  05/02/2018  . Neurogenic bowel   . Neurogenic bladder   . Spastic hemiparesis ([place])   . 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 Cerebral infarction due to embolism of left middle cerebral artery (HCC) s/p thrombectomy 03/01/2018  . Carotid artery dissection ([place]) Left 03/01/2018  . Acute ischemic right MCA stroke ([place]) 03/01/2018  . Right hemiparesis ([place])   . Dysphagia, post-stroke   . Global aphasia   . Leukocytosis   . Folic acid deficiency 07/62/2633  . B12 deficiency 02/23/2018  . Hyperhomocysteinemia ([place]) 02/23/2018  . Smoker     Mickey Hebel W. 08/02/2018, 3:15 PM  Frazier Butt., PT   [facility] [address] [place], [place], [address] Phone: [phone]   Fax:  [phone]  Name: Xavier Villegas MRN: [id number] Date of Birth: April 08, 1981
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
## 2018-08-04 ENCOUNTER — Ambulatory Visit: Payer: BLUE CROSS/BLUE SHIELD

## 2018-08-04 ENCOUNTER — Encounter: Payer: BLUE CROSS/BLUE SHIELD | Admitting: Occupational Therapy

## 2018-08-04 ENCOUNTER — Encounter: Payer: BLUE CROSS/BLUE SHIELD | Admitting: Speech Pathology

## 2018-08-09 ENCOUNTER — Encounter: Payer: BLUE CROSS/BLUE SHIELD | Admitting: Speech Pathology

## 2018-08-09 ENCOUNTER — Encounter: Payer: Self-pay | Admitting: Physical Therapy

## 2018-08-09 ENCOUNTER — Ambulatory Visit: Payer: BLUE CROSS/BLUE SHIELD

## 2018-08-09 ENCOUNTER — Ambulatory Visit: Payer: BLUE CROSS/BLUE SHIELD | Admitting: Occupational Therapy

## 2018-08-09 ENCOUNTER — Ambulatory Visit: Payer: BLUE CROSS/BLUE SHIELD | Admitting: Physical Therapy

## 2018-08-09 ENCOUNTER — Encounter: Payer: Self-pay | Admitting: Occupational Therapy

## 2018-08-09 ENCOUNTER — Encounter: Payer: BLUE CROSS/BLUE SHIELD | Admitting: Occupational Therapy

## 2018-08-09 DIAGNOSIS — R2689 Other abnormalities of gait and mobility: Secondary | ICD-10-CM | POA: Diagnosis not present

## 2018-08-09 DIAGNOSIS — M25511 Pain in right shoulder: Secondary | ICD-10-CM

## 2018-08-09 DIAGNOSIS — M6281 Muscle weakness (generalized): Secondary | ICD-10-CM | POA: Diagnosis not present

## 2018-08-09 DIAGNOSIS — I69351 Hemiplegia and hemiparesis following cerebral infarction affecting right dominant side: Secondary | ICD-10-CM

## 2018-08-09 DIAGNOSIS — R208 Other disturbances of skin sensation: Secondary | ICD-10-CM

## 2018-08-09 DIAGNOSIS — R278 Other lack of coordination: Secondary | ICD-10-CM | POA: Diagnosis not present

## 2018-08-09 DIAGNOSIS — R2681 Unsteadiness on feet: Secondary | ICD-10-CM

## 2018-08-09 DIAGNOSIS — R4701 Aphasia: Secondary | ICD-10-CM

## 2018-08-09 DIAGNOSIS — R482 Apraxia: Secondary | ICD-10-CM | POA: Diagnosis not present

## 2018-08-09 NOTE — Therapy (Signed)
[facility] [address] [place], [place], [address] Phone: [phone]   Fax:  [phone]  Speech Language Pathology Treatment  Patient Details  Name: Xavier Villegas MRN: [id number] Date of Birth: 1980-11-23 Referring Provider (SLP): Jodelle Green, FNP   Encounter Date: 08/09/2018  End of Session - 08/09/18 1000    Visit Number  21    Number of Visits  25    Date for SLP Re-Evaluation  08/09/18    SLP Start Time  0848    SLP Stop Time   0935    SLP Time Calculation (min)  47 min    Activity Tolerance  Patient tolerated treatment well       Past Medical History:  Diagnosis Date  . Aphasia   . GERD (gastroesophageal reflux disease)   . Hemiparesis ([facility]) 02/2018   right side  . Hyperhomocysteinemia ([facility])   . Neurogenic bladder 02/2018   04/26/18 inporving  . Spastic hemiparesis (HCC)    right side  . Stroke ([place])    Left MCA infart  . Tobacco abuse     Past Surgical History:  Procedure Laterality Date  . CRANIOTOMY Left 02/21/2018   Procedure: DECOMPRESSIVE CRANIECTOMY with bone flap to abdomen;  Surgeon: [name], MD;  Location: [facility];  Service: Neurosurgery;  Laterality: Left;  DECOMPRESSIVE CRANIECTOMY with bone flap to abdomen  . CRANIOTOMY N/A 05/02/2018   Procedure: RE-IMPLANTATION OF CRANIAL FLAP;  Surgeon: [name], MD;  Location: [facility];  Service: Neurosurgery;  Laterality: N/A;  RE-IMPLANTATION OF CRANIAL FLAP  . IR ANGIO INTRA EXTRACRAN SEL COM CAROTID INNOMINATE UNI R MOD SED  02/19/2018  . IR ANGIO VERTEBRAL SEL VERTEBRAL BILAT MOD SED  02/19/2018  . IR CT HEAD LTD  02/19/2018  . IR PERCUTANEOUS ART THROMBECTOMY/INFUSION INTRACRANIAL INC DIAG ANGIO  02/19/2018  . IR US GUIDE VASC ACCESS RIGHT  02/19/2018  . RADIOLOGY WITH ANESTHESIA N/A 02/19/2018   Procedure: IR WITH ANESTHESIA CODE STROKE;  Surgeon: Corrie Mckusick, DO;  Location: [facility];  Service: Anesthesiology;  Laterality: N/A;  .  TEE WITHOUT CARDIOVERSION N/A 03/01/2018   Procedure: TRANSESOPHAGEAL ECHOCARDIOGRAM (TEE);  Surgeon: Sanda Klein, MD;  Location[facility];  Service: Cardiovascular;  Laterality: N/A;    There were no vitals filed for this visit.  Subjective Assessment - 08/09/18 1600    Subjective  "O - K" (re: SLP "How are you today?")    Currently in Pain?  No/denies            ADULT SLP TREATMENT - 08/09/18 0916      General Information   Behavior/Cognition  Alert;Cooperative;Pleasant mood      Treatment Provided   Treatment provided  Cognitive-Linquistic      Cognitive-Linquistic Treatment   Treatment focused on  Aphasia;Apraxia    Skilled Treatment  Pt traced his first name x5 and then printed his name with example x1. Pt wrote two letters of his last name spontaneously then faded success after this. SLP encouraged pt to write his first name when he arrives to therapy. In simple conversation re: previous therapy and handouts, pt demonstrated understanding, with rare repeats and occasional extra time needed. Pt indicated he had been practicing "moo" each day with assistance. With consonant/vowel (CV) words, pt was successful 10-20% of the time with max/total assist and simultanous production with SLP. Perseveration was heavy with /b/ substitution in initial position. Pt was minimally better with cue to "whisper" with "p" phoneme  in initial position. Object naming: pt with max/total cues including simultaneous production with minimal success, apraxic responses noted. SLP provided father with information re: Adella Hare, [facility], [facility]- aphasia group, and [employer].       Assessment / Recommendations / Plan   Plan  Discharge SLP treatment due to (comment)   financial considerations     Progression Toward Goals   Progression toward goals  --   see goals - d/c day      SLP Education - 08/09/18 0959    Education Details  [facility] aphasia project meeting  times/website address, [facility], [employer] device choices, [employer] site    Person(s) Educated  Parent(s);Patient    Methods  Explanation;Handout    Comprehension  Verbalized understanding;Need further instruction;Verbal cues required         SLP Long Term Goals - 08/09/18 1557      SLP LONG TERM GOAL #1   Title  Patient will name objects using multi-modal strategies with 80% accuracy.    Status  Not Met      SLP LONG TERM GOAL #2   Title  Patient will read aloud or repeat words, maintaining phonemic accuracy, with 80% accuracy.      Status  Not Met      SLP LONG TERM GOAL #3   Title  Patient will complete 3 unit processing tasks with 80% accuracy without the need of repetition of task instructions or significant delays in responding.    Status  Deferred      SLP LONG TERM GOAL #4   Title  Patient will demonstrate reading comprehension for words and phrases with 80% accuracy.     Status  Deferred       Plan - 08/09/18 1555    Clinical Impression Statement  Pt appears cont'd eager to work on his communication, with persistent severe expressive language/apraxia and mod-severe recpetive aphasia. Pt expressive practice today was heavily perseverative. REcpetive language again appeared better in comparison to expression. SLP told pt's father today that shift of focus for pt communication may be better achieved in teh long term by focusin on using assistive/augmenative device. Pt does not wish to have selfpay status so d/c will occur today.     Speech Therapy Frequency  2x / week    Duration  Other (comment)   12 weeks   Treatment/Interventions  Language facilitation;Multimodal communcation approach;Patient/family education;SLP instruction and feedback;Compensatory techniques;Internal/external aids;Functional tasks;Cueing hierarchy    Potential to Achieve Goals  Good    Potential Considerations  Ability to learn/carryover information;Pain level;Family/community  support;Co-morbidities;Previous level of function;Cooperation/participation level;Severity of impairments;Medical prognosis    Consulted and Agree with Plan of Care  Patient       Patient will benefit from skilled therapeutic intervention in order to improve the following deficits and impairments:   Apraxia  Aphasia   SPEECH THERAPY DISCHARGE SUMMARY  Visits from Start of Care: 21  Current functional level related to goals / functional outcomes: This SLP only had the pleasure of seeing pt 3 times for therapy as he came upon caseload in mid November, after most of pt's covered ST visits were exhausted. In those three visits pt made little progress, however pt's expressive deficits are and remain profound. Receptively, SLP believes pt understands simple conversation and mod complex conversation, and more speaker compensations are necessary as linguistic complexity increases.   Remaining deficits: Severe/profound expressive speech and language deficits, mild receptive defcits. Pt would benefit from an augmentative/alternative communication device in  the future.    Education / Equipment: [facility] aphasia project, [employer] aphasia group, [employer], [facility].   Plan: Patient agrees to discharge.  Patient goals were partially met. Patient is being discharged due to financial reasons.  ?????       Problem List Patient Active Problem List   Diagnosis Date Noted  . CVA (cerebral vascular accident) ([place] [facility]) 05/02/2018  . Neurogenic bowel   . Neurogenic bladder   . Spastic hemiparesis ([place])   . 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 Cerebral infarction due to embolism of left middle cerebral artery (HCC) s/p thrombectomy 03/01/2018  . Carotid artery dissection ([place]) Left 03/01/2018  . Acute ischemic right MCA stroke ([place] [facility]) 03/01/2018  . Right hemiparesis ([facility])   . Dysphagia, post-stroke   . Global aphasia   . Leukocytosis    . Folic acid deficiency 61/44/3246  . B12 deficiency 02/23/2018  . Hyperhomocysteinemia ([facility]) 02/23/2018  . Smoker     , ,MS, CCC-SLP  08/09/2018, 4:00 PM  [facility] [address] [place], [place], [address] Phone: [phone]   Fax:  [phone]   Name: Xavier Villegas MRN: [id number] Date of Birth: 09-08-1980

## 2018-08-09 NOTE — Patient Instructions (Signed)
Access Code: [email or web address]  URL: [email or web address]  Date: 08/09/2018  Prepared by: Willow Ora   Exercises  Supine Bridge - 10 reps - 1 sets - 5 hold - 1x daily - 5x weekly  Straight Leg Raise - 10 reps - 1 sets - 1x daily - 5x weekly  seated hamstring stretch - 3 reps - 1 sets - 30 hold - 1x daily - 5x weekly  Side Stepping with Counter Support - 3 reps - 1 sets - 1x daily - 5x weekly  Walking March - 3 reps - 1 sets - 1x daily - 5x weekly

## 2018-08-09 NOTE — Patient Instructions (Signed)
Your Splint This splint should initially be fitted by a healthcare practitioner.  The healthcare practitioner is responsible for providing wearing instructions and precautions to the patient, other healthcare practitioners and care provider involved in the patient's care.  This splint was custom made for you. Please read the following instructions to learn about wearing and caring for your splint.  Precautions Should your splint cause any of the following problems, remove the splint immediately and contact your therapist/physician.  Swelling  Severe Pain  Pressure Areas  Stiffness  Numbness  Do not wear your splint while operating machinery unless it has been fabricated for that purpose.  When To Wear Your Splint Where your splint according to your therapist/physician instructions. Daytime for 3-4 hours the first day.  If no problems, gradually increase wear time.  If you can wear 6 hours during the day without problems, you can wear at night.  Care and Cleaning of Your Splint 1. Keep your splint away from open flames. 2. Your splint will lose its shape in temperatures over 135 degrees Farenheit, ( in car windows, near radiators, ovens or in hot water).  Never make any adjustments to your splint, if the splint needs adjusting remove it and make an appointment to see your therapist. 3. Your splint, including the cushion liner may be cleaned with soap and lukewarm water.  Do not immerse in hot water over 135 degrees Farenheit. 4. Straps may be washed with soap and water, but do not moisten the self-adhesive portion. 5. For ink or hard to remove spots use a scouring cleanser which contains chlorine.  Rinse the splint thoroughly after using chlorine cleanser.
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
## 2018-08-09 NOTE — Patient Instructions (Addendum)
  For more information about a device to assist with communication: www.aphasia.com They have excellent customer service to assist with insurance coverage as well as with training about how to use the device  For more information about online speech practice tasks: www.talkpaththerapy.com  Provided written handouts re: pt's name  Provided info re: [facility] [place] [facility] [place] group

## 2018-08-09 NOTE — Therapy (Signed)
[facility] [address] [place], [place], [address] Phone: [phone]   Fax:  [phone]  Physical Therapy Treatment  Patient Details  Name: Xavier Villegas MRN: [id number] Date of Birth: 09-29-1980 Referring Provider (PT): Reesa Chew   Encounter Date: 08/09/2018  PT End of Session - 08/09/18 0808    Visit Number  21    Number of Visits  34    Authorization Type  [employer] $3250 deductible & $6350oop max have been met. Pt is covered 100%    Authorization Time Period  VL: PT & OT 30 with insurance calendar year Aug 1-July 31 with 14 reported used. However visit count [facility] notes 18 for both PT & OT.  If PT & OT same day counts as one    Authorization - Visit Number  3    Authorization - Number of Visits  16    PT Start Time  0805    PT Stop Time  3845    PT Time Calculation (min)  39 min    Equipment Utilized During Treatment  Gait belt    Activity Tolerance  Patient tolerated treatment well    Behavior During Therapy  WFL for tasks assessed/performed       Past Medical History:  Diagnosis Date  . Aphasia   . GERD (gastroesophageal reflux disease)   . Hemiparesis ([place]) 02/2018   right side  . Hyperhomocysteinemia ([place])   . Neurogenic bladder 02/2018   04/26/18 inporving  . Spastic hemiparesis ([facility])    right side  . Stroke ([place])    Left MCA infart  . Tobacco abuse     Past Surgical History:  Procedure Laterality Date  . CRANIOTOMY Left 02/21/2018   Procedure: DECOMPRESSIVE CRANIECTOMY with bone flap to abdomen;  Surgeon: [name], MD;  Location: [facility];  Service: Neurosurgery;  Laterality: Left;  DECOMPRESSIVE CRANIECTOMY with bone flap to abdomen  . CRANIOTOMY N/A 05/02/2018   Procedure: RE-IMPLANTATION OF CRANIAL FLAP;  Surgeon: [name], MD;  Location: [facility];  Service: Neurosurgery;  Laterality: N/A;  RE-IMPLANTATION OF CRANIAL FLAP  . IR ANGIO INTRA EXTRACRAN SEL COM CAROTID INNOMINATE UNI R MOD  SED  02/19/2018  . IR ANGIO VERTEBRAL SEL VERTEBRAL BILAT MOD SED  02/19/2018  . IR CT HEAD LTD  02/19/2018  . IR PERCUTANEOUS ART THROMBECTOMY/INFUSION INTRACRANIAL INC DIAG ANGIO  02/19/2018  . IR US GUIDE VASC ACCESS RIGHT  02/19/2018  . RADIOLOGY WITH ANESTHESIA N/A 02/19/2018   Procedure: IR WITH ANESTHESIA CODE STROKE;  Surgeon: Corrie Mckusick, DO;  Location: [facility];  Service: Anesthesiology;  Laterality: N/A;  . TEE WITHOUT CARDIOVERSION N/A 03/01/2018   Procedure: TRANSESOPHAGEAL ECHOCARDIOGRAM (TEE);  Surgeon: Sanda Klein, MD;  Location: [facility] ENDOSCOPY;  Service: Cardiovascular;  Laterality: N/A;    There were no vitals filed for this visit.  Subjective Assessment - 08/09/18 0808    Subjective  No new complaints. No falls or pain to report.     Patient is accompained by:  Family member    Pertinent History  Mr. Letourneau  had left MCA infarct and subsequent craniectomy.  He was discharged from rehab several weeks ago. He has been home with his family  He is having occasional spasms on the right side still.Patient has Right hemiparesis, dysphagia, and aphasia secondary to large left MCA infarct with hemorrhagic transformation s/p hemicraniectomy.He had acute ischemic left MCA infarct and subsequent craniotomy - was first admitted on 03/01/18.  He was discharged  from hospital on 04/06/18    How long can you stand comfortably?  stand for 20 mins    How long can you walk comfortably?  10 mins    Patient Stated Goals  to walk without AD and live on his own.    Currently in Pain?  No/denies    Pain Score  0-No pain           [facility] Adult PT Treatment/Exercise - 08/09/18 4332      Neuro Re-ed    Neuro Re-ed Details   for strengthening/NMR: min assist to get into tall kneeling on mat table with UE support on red pball- had pt perform minisquats, then left leg out/in movements. cues needed on ex form along with facilitation for increased right side weight bearing/weight shifting. min assist to get  out of tall kneeling into short sitting at edge of mat.             Exercises   Exercises  Other Exercises    Other Exercises   educated on and issued HEP for strengthening/balance. min guard assist for balance with standing ones. cues needed on ex form/technique.              PT Education - 08/09/18 1430    Education Details  HEP for LE strengthening    Person(s) Educated  Patient    Methods  Explanation;Demonstration;Verbal cues;Handout    Comprehension  Verbalized understanding;Returned demonstration;Verbal cues required;Tactile cues required;Need further instruction       PT Short Term Goals - 07/28/18 1313      PT SHORT TERM GOAL #1   Title  Sit to/from stand chairs without UE assist with supervision    Status  New    Target Date  09/02/18      PT SHORT TERM GOAL #2   Title  Patient ambulates with straight cane scanning environment without LOB maintaining path with supervision.     Status  New    Target Date  09/02/18      PT SHORT TERM GOAL #3   Title  Patient ambulates with straight cane & AFO >2.00 ft/sec    Status  Revised    Target Date  09/02/18        PT Long Term Goals - 07/28/18 1303      PT LONG TERM GOAL #1   Title  Patient will increase six minute walk test distance to >1000 for progression to community ambulator and improve gait ability    Baseline  585 feet 05/10/18: 5100f 10/31: 6462f   Time  12    Period  Weeks    Status  On-going    Target Date  09/30/18      PT LONG TERM GOAL #2   Title  Patient will increase 10 meter walk test to >3.28 ft/sec (1.33m2m as to improve gait speed for better community ambulation and to reduce fall risk.    Baseline  1.57 ft/sec (0.48 m/sec) 05/10/18: 2.33 ft/sec (0.40m46m10/31: 2.53 ft/sec (0.40m/64m12/19: 1.87 ft/sec cane quad tip & 1.65 ft/sec no AD minA    Time  12    Period  Weeks    Status  On-going    Target Date  09/30/18      PT LONG TERM GOAL #3   Title  Patient will increase Berg Balance score  by >45/56 to demonstrate decreased fall risk during functional activities.    Baseline  25/56 05/10/18: 36/56 10/31: 39/56  12/19:  39/56    Time  12    Period  Weeks    Status  Revised    Target Date  09/30/18      PT LONG TERM GOAL #4   Title  Patient will be require no assist with ascend/descend 3 steps with single rail including on right side modified independent    Baseline  12/19: supervision with rail on left side only (which requires 2 rails to be on left side ascend & descend)    Time  12    Period  Weeks    Status  Revised    Target Date  09/30/18      PT LONG TERM GOAL #5   Title  Dynamic Gait Index with cane quad tip >12/24    Baseline  12/19: 4/24    Status  New    Target Date  09/30/18            Plan - 08/09/18 0809    Clinical Impression Statement  Today's skilled session focused on re-establishment of an HEP for strengthening at home. Remainder of session addressed strengthening with emphasis on right LE weight shifitng. No issues reported or noted in session. The pt is progressing toward goals and should benefit from continued PT to progress toward unmet goals.     Rehab Potential  Good    PT Frequency  2x / week   16 visits   PT Duration  8 weeks    PT Treatment/Interventions  Manual techniques;Patient/family education;Neuromuscular re-education;Balance training;Functional mobility training;Therapeutic activities;Therapeutic exercise;Gait training;Orthotic Fit/Training;Passive range of motion;ADLs/Self Care Home Management;Electrical Stimulation;Stair training;Energy conservation;Taping;DME Instruction    PT Next Visit Plan  continue to work SLS and RLE stregnthening/weightbearing). Gait training with straight cane normal tip. Work towards updated STGs.    PT Home Exercise Plan  Access Code: [email or web address]     Consulted and Agree with Plan of Care  Patient       Patient will benefit from skilled therapeutic intervention in order to improve the following deficits  and impairments:  Abnormal gait, Decreased balance, Decreased endurance, Decreased mobility, Difficulty walking, Impaired sensation, Increased muscle spasms, Impaired UE functional use, Impaired flexibility, Decreased strength, Decreased coordination, Decreased activity tolerance  Visit Diagnosis: Hemiplegia and hemiparesis following cerebral infarction affecting right dominant side (HCC)  Muscle weakness (generalized)  Unsteadiness on feet     Problem List Patient Active Problem List   Diagnosis Date Noted  . CVA (cerebral vascular accident) ([place]) 05/02/2018  . Neurogenic bowel   . Neurogenic bladder   . Spastic hemiparesis ([place])   . 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 Cerebral infarction due to embolism of left middle cerebral artery (HCC) s/p thrombectomy 03/01/2018  . Carotid artery dissection ([place]) Left 03/01/2018  . Acute ischemic right MCA stroke ([place]) 03/01/2018  . Right hemiparesis ([place])   . Dysphagia, post-stroke   . Global aphasia   . Leukocytosis   . Folic acid deficiency 62/86/3817  . B12 deficiency 02/23/2018  . Hyperhomocysteinemia ([place]) 02/23/2018  . Smoker     Willow Ora, PTA, [facility] [address] [place], [place] [address] [phone] 08/09/18, 2:30 PM   Name: Xavier Villegas MRN: [id number] Date of Birth: 28-Dec-1980

## 2018-08-11 ENCOUNTER — Ambulatory Visit: Payer: BLUE CROSS/BLUE SHIELD

## 2018-08-11 ENCOUNTER — Encounter: Payer: BLUE CROSS/BLUE SHIELD | Admitting: Occupational Therapy

## 2018-08-11 ENCOUNTER — Encounter: Payer: BLUE CROSS/BLUE SHIELD | Admitting: Speech Pathology

## 2018-08-15 ENCOUNTER — Encounter: Payer: BLUE CROSS/BLUE SHIELD | Admitting: Physical Medicine & Rehabilitation

## 2018-08-15 ENCOUNTER — Encounter: Payer: Self-pay | Admitting: Physical Therapy

## 2018-08-15 ENCOUNTER — Ambulatory Visit: Payer: BLUE CROSS/BLUE SHIELD | Attending: Family Medicine | Admitting: Occupational Therapy

## 2018-08-15 ENCOUNTER — Ambulatory Visit: Payer: BLUE CROSS/BLUE SHIELD | Admitting: Physical Therapy

## 2018-08-15 DIAGNOSIS — R2689 Other abnormalities of gait and mobility: Secondary | ICD-10-CM | POA: Diagnosis not present

## 2018-08-15 DIAGNOSIS — M6281 Muscle weakness (generalized): Secondary | ICD-10-CM

## 2018-08-15 DIAGNOSIS — R208 Other disturbances of skin sensation: Secondary | ICD-10-CM | POA: Diagnosis not present

## 2018-08-15 DIAGNOSIS — R2681 Unsteadiness on feet: Secondary | ICD-10-CM

## 2018-08-15 DIAGNOSIS — R278 Other lack of coordination: Secondary | ICD-10-CM | POA: Insufficient documentation

## 2018-08-15 DIAGNOSIS — I69351 Hemiplegia and hemiparesis following cerebral infarction affecting right dominant side: Secondary | ICD-10-CM

## 2018-08-15 DIAGNOSIS — R482 Apraxia: Secondary | ICD-10-CM | POA: Insufficient documentation

## 2018-08-15 NOTE — Therapy (Signed)
[facility] [address] [place], [place], [address] Phone: [phone]   Fax:  [phone]  Physical Therapy Treatment  Patient Details  Name: Xavier Villegas MRN: [id number] Date of Birth: 09-Dec-1980 Referring Provider (PT): Reesa Chew   Encounter Date: 08/15/2018  PT End of Session - 08/15/18 0807    Visit Number  22    Number of Visits  34    Authorization Type  [employer]BS $3250 deductible & $6350oop max have been met. Pt is covered 100%    Authorization Time Period  VL: PT & OT 30 with insurance calendar year Aug 1-July 31 with 14 reported used. However visit count [facility] notes 18 for both PT & OT.  If PT & OT same day counts as one    Authorization - Visit Number  4    Authorization - Number of Visits  16    PT Start Time  0805    PT Stop Time  0845    PT Time Calculation (min)  40 min    Equipment Utilized During Treatment  Gait belt    Activity Tolerance  Patient tolerated treatment well    Behavior During Therapy  WFL for tasks assessed/performed       Past Medical History:  Diagnosis Date  . Aphasia   . GERD (gastroesophageal reflux disease)   . Hemiparesis ([place]) 02/2018   right side  . Hyperhomocysteinemia ([place])   . Neurogenic bladder 02/2018   04/26/18 inporving  . Spastic hemiparesis (HCC)    right side  . Stroke ([place])    Left MCA infart  . Tobacco abuse     Past Surgical History:  Procedure Laterality Date  . CRANIOTOMY Left 02/21/2018   Procedure: DECOMPRESSIVE CRANIECTOMY with bone flap to abdomen;  Surgeon: [name], MD;  Location: [facility];  Service: Neurosurgery;  Laterality: Left;  DECOMPRESSIVE CRANIECTOMY with bone flap to abdomen  . CRANIOTOMY N/A 05/02/2018   Procedure: RE-IMPLANTATION OF CRANIAL FLAP;  Surgeon: [name], MD;  Location: [facility];  Service: Neurosurgery;  Laterality: N/A;  RE-IMPLANTATION OF CRANIAL FLAP  . IR ANGIO INTRA EXTRACRAN SEL COM CAROTID INNOMINATE UNI R MOD  SED  02/19/2018  . IR ANGIO VERTEBRAL SEL VERTEBRAL BILAT MOD SED  02/19/2018  . IR CT HEAD LTD  02/19/2018  . IR PERCUTANEOUS ART THROMBECTOMY/INFUSION INTRACRANIAL INC DIAG ANGIO  02/19/2018  . IR US GUIDE VASC ACCESS RIGHT  02/19/2018  . RADIOLOGY WITH ANESTHESIA N/A 02/19/2018   Procedure: IR WITH ANESTHESIA CODE STROKE;  Surgeon: Corrie Mckusick, DO;  Location: [facility];  Service: Anesthesiology;  Laterality: N/A;  . TEE WITHOUT CARDIOVERSION N/A 03/01/2018   Procedure: TRANSESOPHAGEAL ECHOCARDIOGRAM (TEE);  Surgeon: Sanda Klein, MD;  [facility]COPY;  Service: Cardiovascular;  Laterality: N/A;    There were no vitals filed for this visit.  Subjective Assessment - 08/15/18 0807    Subjective  No new complaints. No falls or pain to report.     Pertinent History  [name]  had left MCA infarct and subsequent craniectomy.  He was discharged from rehab several weeks ago. He has been home with his family  He is having occasional spasms on the right side still.Patient has Right hemiparesis, dysphagia, and aphasia secondary to large left MCA infarct with hemorrhagic transformation s/p hemicraniectomy.He had acute ischemic left MCA infarct and subsequent craniotomy - was first admitted on 03/01/18.  He was discharged from hospital on 04/06/18    How long can  you stand comfortably?  stand for 20 mins    How long can you walk comfortably?  10 mins    Patient Stated Goals  to walk without AD and live on his own.    Currently in Pain?  No/denies    Pain Score  0-No pain            [facility] Adult PT Treatment/Exercise - 08/15/18 0808      Ambulation/Gait   Ambulation/Gait  Yes    Ambulation/Gait Assistance  4: Min guard;4: Min assist    Ambulation/Gait Assistance Details  pt noted to still have mild genu recurvatum with gait with use of brace/heel wedge on right LE. added mildly larger heel wedge with no significant change noted.     Ambulation Distance (Feet)  115 Feet   x1, 230 x1    Assistive device  Straight cane    Gait Pattern  Step-through pattern;Decreased arm swing - right;Decreased step length - right;Decreased stance time - right;Decreased stride length;Decreased weight shift to right;Right hip hike;Right steppage;Right foot flat;Right flexed knee in stance;Antalgic;Lateral hip instability;Trunk flexed;Abducted- right    Ambulation Surface  Level;Indoor      Neuro Re-ed    Neuro Re-ed Details   for strengthening/NMR: standing with cane support- right stance with left stepping fwd/bwd over red beam x 10 reps to work on weight shifting onto right LE/incr left LE step length; at bottom of steps with left UE support on rail: right stance with left foot tapping up/down bottom 3 stairs, manual assist to right knee to prevent hyperextension; left stance with right foot tappin up/down bottom 2 steps with empahsis on hip/knee fleixon, assistance needed for correct form; right foot on bottom step: lifting left LE up into air/back down x 10 reps with assist for knee control on right LE.                        Balance Exercises - 08/15/18 0835      Balance Exercises: Standing   SLS with Vectors  Solid surface;Other reps (comment);Limitations      Balance Exercises: Standing   SLS with Vectors Limitations  2 tall cones on floor with pt in wide stance: alternating fwd toe taps x 10 reps each leg, then alternating cross toe taps each leg x 10 reps each. min assist for balance with cues on stance position, weight shifitng and posture. no UE support.            PT Short Term Goals - 07/28/18 1313      PT SHORT TERM GOAL #1   Title  Sit to/from stand chairs without UE assist with supervision    Status  New    Target Date  09/02/18      PT SHORT TERM GOAL #2   Title  Patient ambulates with straight cane scanning environment without LOB maintaining path with supervision.     Status  New    Target Date  09/02/18      PT SHORT TERM GOAL #3   Title  Patient ambulates with  straight cane & AFO >2.00 ft/sec    Status  Revised    Target Date  09/02/18        PT Long Term Goals - 07/28/18 1303      PT LONG TERM GOAL #1   Title  Patient will increase six minute walk test distance to >1000 for progression to community ambulator and improve gait ability  Baseline  585 feet 05/10/18: 550f 10/31: 6410f   Time  12    Period  Weeks    Status  On-going    Target Date  09/30/18      PT LONG TERM GOAL #2   Title  Patient will increase 10 meter walk test to >3.28 ft/sec (1.46m24m as to improve gait speed for better community ambulation and to reduce fall risk.    Baseline  1.57 ft/sec (0.48 m/sec) 05/10/18: 2.33 ft/sec (0.76m7m10/31: 2.53 ft/sec (0.48m/31m12/19: 1.87 ft/sec cane quad tip & 1.65 ft/sec no AD minA    Time  12    Period  Weeks    Status  On-going    Target Date  09/30/18      PT LONG TERM GOAL #3   Title  Patient will increase Berg Balance score by >45/56 to demonstrate decreased fall risk during functional activities.    Baseline  25/56 05/10/18: 36/56 10/31: 39/56  12/19: 39/56    Time  12    Period  Weeks    Status  Revised    Target Date  09/30/18      PT LONG TERM GOAL #4   Title  Patient will be require no assist with ascend/descend 3 steps with single rail including on right side modified independent    Baseline  12/19: supervision with rail on left side only (which requires 2 rails to be on left side ascend & descend)    Time  12    Period  Weeks    Status  Revised    Target Date  09/30/18      PT LONG TERM GOAL #5   Title  Dynamic Gait Index with cane quad tip >12/24    Baseline  12/19: 4/24    Status  New    Target Date  09/30/18            Plan - 08/15/18 0807    Clinical Impression Statement  Today's skilled session continued to focus on LE strengthening, balance and gait with straight cane. Pt continues to have right knee genu recurvatum with stance despite brace/heel wedge. Will continue to work on strengthening, ?  try different braces as he has had this one since in the hospital. The pt is progressing toward goals and should benefit from continued PT to progress toward unmet goals.     Rehab Potential  Good    PT Frequency  2x / week   16 visits   PT Duration  8 weeks    PT Treatment/Interventions  Manual techniques;Patient/family education;Neuromuscular re-education;Balance training;Functional mobility training;Therapeutic activities;Therapeutic exercise;Gait training;Orthotic Fit/Training;Passive range of motion;ADLs/Self Care Home Management;Electrical Stimulation;Stair training;Energy conservation;Taping;DME Instruction    PT Next Visit Plan  continue to work SLS and RLE stregnthening/weightbearing. Gait training with straight cane normal tip. Work towards updated STGs. ? trial of different braces    PT Home Exercise Plan  Access Code: [email or web address]     Consulted and Agree with Plan of Care  Patient       Patient will benefit from skilled therapeutic intervention in order to improve the following deficits and impairments:  Abnormal gait, Decreased balance, Decreased endurance, Decreased mobility, Difficulty walking, Impaired sensation, Increased muscle spasms, Impaired UE functional use, Impaired flexibility, Decreased strength, Decreased coordination, Decreased activity tolerance  Visit Diagnosis: Hemiplegia and hemiparesis following cerebral infarction affecting right dominant side (HCC)  Muscle weakness (generalized)  Other lack of coordination  Unsteadiness on feet  Problem List Patient Active Problem List   Diagnosis Date Noted  . CVA (cerebral vascular accident) ([place]) 05/02/2018  . Neurogenic bowel   . Neurogenic bladder   . Spastic hemiparesis ([place])   . 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 Cerebral infarction due to embolism of left middle cerebral artery (HCC) s/p thrombectomy 03/01/2018  . Carotid artery dissection  ([place]) Left 03/01/2018  . Acute ischemic right MCA stroke ([place]) 03/01/2018  . Right hemiparesis ([place])   . Dysphagia, post-stroke   . Global aphasia   . Leukocytosis   . Folic acid deficiency 65/99/3570  . B12 deficiency 02/23/2018  . Hyperhomocysteinemia ([place]) 02/23/2018  . Smoker     Willow Ora, PTA, [facility] [address] [place], [place] [address] [phone] 08/15/18, 11:45 AM    Name: Rien Marland MRN: [id number] Date of Birth: June 11, 1981
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
## 2018-08-17 ENCOUNTER — Telehealth: Payer: Self-pay | Admitting: Physical Medicine & Rehabilitation

## 2018-08-17 ENCOUNTER — Ambulatory Visit: Payer: BLUE CROSS/BLUE SHIELD | Admitting: Occupational Therapy

## 2018-08-17 ENCOUNTER — Encounter: Payer: Self-pay | Admitting: Physical Therapy

## 2018-08-17 ENCOUNTER — Ambulatory Visit: Payer: BLUE CROSS/BLUE SHIELD | Admitting: Physical Therapy

## 2018-08-17 DIAGNOSIS — M6281 Muscle weakness (generalized): Secondary | ICD-10-CM | POA: Diagnosis not present

## 2018-08-17 DIAGNOSIS — R2681 Unsteadiness on feet: Secondary | ICD-10-CM

## 2018-08-17 DIAGNOSIS — I69351 Hemiplegia and hemiparesis following cerebral infarction affecting right dominant side: Secondary | ICD-10-CM

## 2018-08-17 DIAGNOSIS — R278 Other lack of coordination: Secondary | ICD-10-CM

## 2018-08-17 DIAGNOSIS — R208 Other disturbances of skin sensation: Secondary | ICD-10-CM

## 2018-08-17 DIAGNOSIS — R482 Apraxia: Secondary | ICD-10-CM | POA: Diagnosis not present

## 2018-08-17 DIAGNOSIS — R2689 Other abnormalities of gait and mobility: Secondary | ICD-10-CM | POA: Diagnosis not present

## 2018-08-17 NOTE — Therapy (Signed)
[facility] [address] [place], [place], [address] Phone: [phone]   Fax:  [phone]  Physical Therapy Treatment  Patient Details  Name: Xavier Villegas MRN: [id number] Date of Birth: 11-Aug-1980 Referring Provider (PT): Xavier Villegas   Encounter Date: 08/17/2018  PT End of Session - 08/17/18 1023    Visit Number  23    Number of Visits  34    Authorization Type  BCBS $3250 deductible & $6350oop max have been met. Pt is covered 100%    Authorization Time Period  VL: PT & OT 30 with insurance calendar year Aug 1-July 31 with 14 reported used. However visit count [facility] notes 18 for both PT & OT.  If PT & OT same day counts as one    Authorization - Visit Number  5    Authorization - Number of Visits  16    PT Start Time  3903    PT Stop Time  1058    PT Time Calculation (min)  40 min    Equipment Utilized During Treatment  Gait belt    Activity Tolerance  Patient tolerated treatment well    Behavior During Therapy  [facility]FL for tasks assessed/performed       Past Medical History:  Diagnosis Date  . Aphasia   . GERD (gastroesophageal reflux disease)   . Hemiparesis ([place]) 02/2018   right side  . Hyperhomocysteinemia ([place])   . Neurogenic bladder 02/2018   04/26/18 inporving  . Spastic hemiparesis (HCC)    right side  . Stroke ([place])    Left MCA infart  . Tobacco abuse     Past Surgical History:  Procedure Laterality Date  . CRANIOTOMY Left 02/21/2018   Procedure: DECOMPRESSIVE CRANIECTOMY with bone flap to abdomen;  Surgeon: Xavier Kos, MD;  Location: [facility];  Service: Neurosurgery;  Laterality: Left;  DECOMPRESSIVE CRANIECTOMY with bone flap to abdomen  . CRANIOTOMY N/A 05/02/2018   Procedure: RE-IMPLANTATION OF CRANIAL FLAP;  Surgeon: Xavier Kos, MD;  Location: [facility];  Service: Neurosurgery;  Laterality: N/A;  RE-IMPLANTATION OF CRANIAL FLAP  . IR ANGIO INTRA EXTRACRAN SEL COM CAROTID INNOMINATE UNI R MOD  SED  02/19/2018  . IR ANGIO VERTEBRAL SEL VERTEBRAL BILAT MOD SED  02/19/2018  . IR CT HEAD LTD  02/19/2018  . IR PERCUTANEOUS ART THROMBECTOMY/INFUSION INTRACRANIAL INC DIAG ANGIO  02/19/2018  . IR US GUIDE VASC ACCESS RIGHT  02/19/2018  . RADIOLOGY WITH ANESTHESIA N/A 02/19/2018   Procedure: IR WITH ANESTHESIA CODE STROKE;  Surgeon: Xavier Mckusick, DO;  Location: [facility];  Service: Anesthesiology;  Laterality: N/A;  . TEE WITHOUT CARDIOVERSION N/A 03/01/2018   Procedure: TRANSESOPHAGEAL ECHOCARDIOGRAM (TEE);  Surgeon: Xavier Klein, MD;  Location: [facility] ENDOSCOPY;  Service: Cardiovascular;  Laterality: N/A;    There were no vitals filed for this visit.  Subjective Assessment - 08/17/18 1023    Subjective  No new complaints. No falls or pain to report.     Pertinent History  Xavier Villegas  had left MCA infarct and subsequent craniectomy.  He was discharged from rehab several weeks ago. He has been home with his family  He is having occasional spasms on the right side still.Patient has Right hemiparesis, dysphagia, and aphasia secondary to large left MCA infarct with hemorrhagic transformation s/p hemicraniectomy.He had acute ischemic left MCA infarct and subsequent craniotomy - was first admitted on 03/01/18.  He was discharged from hospital on 04/06/18    How long can  you stand comfortably?  stand for 20 mins    How long can you walk comfortably?  10 mins    Patient Stated Goals  to walk without AD and live on his own.    Currently in Pain?  No/denies    Pain Score  0-No pain             [facility] Adult PT Treatment/Exercise - 08/17/18 1024      Transfers   Transfers  Sit to Stand;Stand to Sit;Floor to Transfer    Sit to Stand  5: Supervision;With upper extremity assist;From bed;From chair/3-in-1    Stand to Sit  5: Supervision;With upper extremity assist;To bed;To chair/3-in-1    Floor to Transfer  4: Min guard;4: Min assist    Floor to Transfer Details (indicate cue type and reason)  min guard  to lower to red mat on floor with UE support on blue mat table      Ambulation/Gait   Ambulation/Gait  Yes    Ambulation/Gait Assistance  4: Min guard;5: Supervision    Ambulation/Gait Assistance Details  cues on equal step length, incr stance time on right LE.    Ambulation Distance (Feet)  100 Feet   x2 in/out of gym   Assistive device  Large base quad cane    Gait Pattern  Step-through pattern;Decreased arm swing - right;Decreased step length - right;Decreased stance time - right;Decreased stride length;Decreased weight shift to right;Right hip hike;Right steppage;Right foot flat;Right flexed knee in stance;Antalgic;Lateral hip instability;Trunk flexed;Abducted- right    Ambulation Surface  Level;Indoor      Neuro Re-ed    Neuro Re-ed Details   for strengthening/NMR: on red mat on floor next to blue mat table: in tall kneeling- mini squats with emphasis on equal LE weight bearing with single UE support x 2, then no UE support x 10 reps, min assist for balance/ex form/weight shifitng; in tall kneeling with left UE support on blue mat table- side stepping left<>right x 4 laps toward each direction with a short rest break between, min assist to clear bil feet each time with cues on posture, weight shifting and to lift at hips, then bwd/fwd walking x 4 laps with short rest break after 2 laps, cues on posture, hip extension, weight shifting and assist to clear feet each time.           Balance Exercises - 08/17/18 1047      Balance Exercises: Standing   SLS with Vectors  Solid surface;Other reps (comment);Limitations      Balance Exercises: Standing   SLS with Vectors Limitations  2 tall cones on floor with pt in wide stance: alternating fwd toe taps x 10 reps each leg, then alternating cross toe taps each leg x 10 reps each. min assist for balance with cues on stance position, weight shifitng and posture. no UE support.            PT Short Term Goals - 07/28/18 1313      PT SHORT  TERM GOAL #1   Title  Sit to/from stand chairs without UE assist with supervision    Status  New    Target Date  09/02/18      PT SHORT TERM GOAL #2   Title  Patient ambulates with straight cane scanning environment without LOB maintaining path with supervision.     Status  New    Target Date  09/02/18      PT SHORT TERM GOAL #3   Title  Patient ambulates with straight cane & AFO >2.00 ft/sec    Status  Revised    Target Date  09/02/18        PT Long Term Goals - 07/28/18 1303      PT LONG TERM GOAL #1   Title  Patient will increase six minute walk test distance to >1000 for progression to community ambulator and improve gait ability    Baseline  585 feet 05/10/18: 575f 10/31: 6455f   Time  12    Period  Weeks    Status  On-going    Target Date  09/30/18      PT LONG TERM GOAL #2   Title  Patient will increase 10 meter walk test to >3.28 ft/sec (1.52m3m as to improve gait speed for better community ambulation and to reduce fall risk.    Baseline  1.57 ft/sec (0.48 m/sec) 05/10/18: 2.33 ft/sec (0.71m33m10/31: 2.53 ft/sec (0.82m/74m12/19: 1.87 ft/sec cane quad tip & 1.65 ft/sec no AD minA    Time  12    Period  Weeks    Status  On-going    Target Date  09/30/18      PT LONG TERM GOAL #3   Title  Patient will increase Berg Balance score by >45/56 to demonstrate decreased fall risk during functional activities.    Baseline  25/56 05/10/18: 36/56 10/31: 39/56  12/19: 39/56    Time  12    Period  Weeks    Status  Revised    Target Date  09/30/18      PT LONG TERM GOAL #4   Title  Patient will be require no assist with ascend/descend 3 steps with single rail including on right side modified independent    Baseline  12/19: supervision with rail on left side only (which requires 2 rails to be on left side ascend & descend)    Time  12    Period  Weeks    Status  Revised    Target Date  09/30/18      PT LONG TERM GOAL #5   Title  Dynamic Gait Index with cane quad tip  >12/24    Baseline  12/19: 4/24    Status  New    Target Date  09/30/18            Plan - 08/17/18 1024    Clinical Impression Statement  Today's skilled session focused on strengthening and neuro re-ed activities with no complaints except for right LE fatigue after session. Pt did have increased spasms after mat activies that decreased with rest. The pt is progressing toward goals and should benefit from continued PT to progress toward unmet goals.    Rehab Potential  Good    PT Frequency  2x / week   16 visits   PT Duration  8 weeks    PT Treatment/Interventions  Manual techniques;Patient/family education;Neuromuscular re-education;Balance training;Functional mobility training;Therapeutic activities;Therapeutic exercise;Gait training;Orthotic Fit/Training;Passive range of motion;ADLs/Self Care Home Management;Electrical Stimulation;Stair training;Energy conservation;Taping;DME Instruction    PT Next Visit Plan  continue to work SLS and RLE stregnthening/weightbearing. Gait training with straight cane normal tip. Work towards updated STGs. ? trial of different braces    PT Home Exercise Plan  Access Code: [email or web address]     Consulted and Agree with Plan of Care  Patient       Patient will benefit from skilled therapeutic intervention in order to improve the following deficits and impairments:  Abnormal gait, Decreased balance,  Decreased endurance, Decreased mobility, Difficulty walking, Impaired sensation, Increased muscle spasms, Impaired UE functional use, Impaired flexibility, Decreased strength, Decreased coordination, Decreased activity tolerance  Visit Diagnosis: Hemiplegia and hemiparesis following cerebral infarction affecting right dominant side (HCC)  Muscle weakness (generalized)  Other lack of coordination  Unsteadiness on feet     Problem List Patient Active Problem List   Diagnosis Date Noted  . CVA (cerebral vascular accident) ([place]) 05/02/2018  . Neurogenic bowel    . Neurogenic bladder   . Spastic hemiparesis ([place])   . 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 Cerebral infarction due to embolism of left middle cerebral artery (HCC) s/p thrombectomy 03/01/2018  . Carotid artery dissection ([place]) Left 03/01/2018  . Acute ischemic right MCA stroke ([place]) 03/01/2018  . Right hemiparesis ([place])   . Dysphagia, post-stroke   . Global aphasia   . Leukocytosis   . Folic acid deficiency 17/20/9106  . B12 deficiency 02/23/2018  . Hyperhomocysteinemia ([facility]) 02/23/2018  . Smoker     Willow Ora, PTA, [facility] [address] [place], [place] [address] [phone] 08/17/18, 3:12 PM   Name: Xavier Villegas MRN: [id number] Date of Birth: Jul 13, 1981

## 2018-08-17 NOTE — Telephone Encounter (Signed)
You can change his visit to March and we can do botox then if he wants and needs it. Last injection was done 12/2
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
## 2018-08-17 NOTE — Telephone Encounter (Signed)
Received prior auth for 4 botox visits - do you want to change the February appt to botox instead of follow up?

## 2018-08-23 ENCOUNTER — Encounter: Payer: Self-pay | Admitting: Physical Therapy

## 2018-08-23 ENCOUNTER — Ambulatory Visit: Payer: BLUE CROSS/BLUE SHIELD | Admitting: Occupational Therapy

## 2018-08-23 ENCOUNTER — Ambulatory Visit: Payer: BLUE CROSS/BLUE SHIELD | Admitting: Physical Therapy

## 2018-08-23 DIAGNOSIS — R482 Apraxia: Secondary | ICD-10-CM | POA: Diagnosis not present

## 2018-08-23 DIAGNOSIS — R208 Other disturbances of skin sensation: Secondary | ICD-10-CM

## 2018-08-23 DIAGNOSIS — I69351 Hemiplegia and hemiparesis following cerebral infarction affecting right dominant side: Secondary | ICD-10-CM | POA: Diagnosis not present

## 2018-08-23 DIAGNOSIS — R278 Other lack of coordination: Secondary | ICD-10-CM | POA: Diagnosis not present

## 2018-08-23 DIAGNOSIS — R2681 Unsteadiness on feet: Secondary | ICD-10-CM

## 2018-08-23 DIAGNOSIS — M6281 Muscle weakness (generalized): Secondary | ICD-10-CM

## 2018-08-23 DIAGNOSIS — R2689 Other abnormalities of gait and mobility: Secondary | ICD-10-CM

## 2018-08-23 NOTE — Therapy (Signed)
[facility] [address] [place], [place], [address] Phone: [phone]   Fax:  [phone]  Physical Therapy Treatment  Patient Details  Name: Xavier Villegas MRN: [id number] Date of Birth: 08-21-1980 Referring Provider (PT): Reesa Chew   Encounter Date: 08/23/2018  PT End of Session - 08/23/18 1155    Visit Number  24    Number of Visits  34    Authorization Type  08/23/2018: Father reports on 09/09/17 he transitions to [facility] but coverage does not change only amount they pay for premium.  [employer]BS $3250 deductible & $6350oop max have been met. Pt is covered 100%    Authorization Time Period  VL: PT & OT 30 with insurance calendar year Aug 1-July 31 with 14 reported used. However visit count [facility] notes 18 for both PT & OT.  If PT & OT same day counts as one    Authorization - Visit Number  6    Authorization - Number of Visits  16    PT Start Time  1100    PT Stop Time  1146    PT Time Calculation (min)  46 min    Equipment Utilized During Treatment  Gait belt    Activity Tolerance  Patient tolerated treatment well    Behavior During Therapy  WFL for tasks assessed/performed       Past Medical History:  Diagnosis Date  . Aphasia   . GERD (gastroesophageal reflux disease)   . Hemiparesis ([place]) 02/2018   right side  . Hyperhomocysteinemia ([place])   . Neurogenic bladder 02/2018   04/26/18 inporving  . Spastic hemiparesis (HCC)    right side  . Stroke ([place])    Left MCA infart  . Tobacco abuse     Past Surgical History:  Procedure Laterality Date  . CRANIOTOMY Left 02/21/2018   Procedure: DECOMPRESSIVE CRANIECTOMY with bone flap to abdomen;  Surgeon: [name], MD;  Location: [facility];  Service: Neurosurgery;  Laterality: Left;  DECOMPRESSIVE CRANIECTOMY with bone flap to abdomen  . CRANIOTOMY N/A 05/02/2018   Procedure: RE-IMPLANTATION OF CRANIAL FLAP;  Surgeon: [name], MD;  Location: [facility];  Service:  Neurosurgery;  Laterality: N/A;  RE-IMPLANTATION OF CRANIAL FLAP  . IR ANGIO INTRA EXTRACRAN SEL COM CAROTID INNOMINATE UNI R MOD SED  02/19/2018  . IR ANGIO VERTEBRAL SEL VERTEBRAL BILAT MOD SED  02/19/2018  . IR CT HEAD LTD  02/19/2018  . IR PERCUTANEOUS ART THROMBECTOMY/INFUSION INTRACRANIAL INC DIAG ANGIO  02/19/2018  . IR US GUIDE VASC ACCESS RIGHT  02/19/2018  . RADIOLOGY WITH ANESTHESIA N/A 02/19/2018   Procedure: IR WITH ANESTHESIA CODE STROKE;  Surgeon: Corrie Mckusick, DO;  Location: [facility];  Service: Anesthesiology;  Laterality: N/A;  . TEE WITHOUT CARDIOVERSION N/A 03/01/2018   Procedure: TRANSESOPHAGEAL ECHOCARDIOGRAM (TEE);  Surgeon: Sanda Klein, MD;  Location: [facility] ENDOSCOPY;  Service: Cardiovascular;  Laterality: N/A;    There were no vitals filed for this visit.  Subjective Assessment - 08/23/18 1100    Subjective  No falls. The exercises are going well.     Pertinent History  Mr. Rusher  had left MCA infarct and subsequent craniectomy.  He was discharged from rehab several weeks ago. He has been home with his family  He is having occasional spasms on the right side still.Patient has Right hemiparesis, dysphagia, and aphasia secondary to large left MCA infarct with hemorrhagic transformation s/p hemicraniectomy.He had acute ischemic left MCA infarct and subsequent craniotomy -  was first admitted on 03/01/18.  He was discharged from hospital on 04/06/18    How long can you stand comfortably?  stand for 20 mins    How long can you walk comfortably?  10 mins    Patient Stated Goals  to walk without AD and live on his own.    Currently in Pain?  No/denies                       [facility] Adult PT Treatment/Exercise - 08/23/18 1100      Transfers   Transfers  Sit to Stand;Stand to Sit;Floor to Transfer    Sit to Stand  5: Supervision;Without upper extremity assist;From chair/3-in-1    Stand to Sit  5: Supervision;With upper extremity assist;To chair/3-in-1    Floor to  Transfer  --      Ambulation/Gait   Ambulation/Gait  Yes    Ambulation/Gait Assistance  4: Min guard;5: Supervision    Ambulation/Gait Assistance Details  tactile & verbal cues to decrease abduction, wt shift on RLE with step thru.    Ambulation Distance (Feet)  300 Feet    Assistive device  Straight cane;Other (Comment)   AFO   Gait Pattern  Step-through pattern;Decreased arm swing - right;Decreased stance time - right;Decreased stride length;Decreased weight shift to right;Right hip hike;Right steppage;Right foot flat;Right flexed knee in stance;Antalgic;Lateral hip instability;Trunk flexed;Abducted- right;Decreased step length - left    Ambulation Surface  Indoor;Level    Stairs  Yes    Stairs Assistance  5: Supervision;4: Min guard   step-to supervision, min guard alternating   Stairs Assistance Details (indicate cue type and reason)  tactile & verbal cues on alternate pattern with RLE engagement    Stair Management Technique  One rail Left;Step to pattern;Alternating pattern;Forwards    Number of Stairs  4   1 reps step to and 3 reps alternating   Ramp  5: Supervision   min guard / tactile cues straight cane & AFO   Ramp Details (indicate cue type and reason)  verbal & tactile cues on posture & wt shift over RLE    Curb  4: Min assist;5: Supervision   Min guard   Curb Details (indicate cue type and reason)  verbal & tactile cues on posture & wt shift over RLE    Gait Comments  in parallel bars with mirrors & center board for visual reference working on narrow base 2-4" width / not abducting RLE      High Level Balance   High Level Balance Activities  Head turns;Other (comment)   walking with eyes closed to simulate walking in dark   High Level Balance Comments  tactile & verbal cues on maintaining path wtih scanning      Neuro Re-ed    Neuro Re-ed Details   --      Exercises   Other Exercises   Prostretch standing hamstring stretch 20 sec hold 2 reps per LE           Balance Exercises - 08/23/18 1100      Balance Exercises: Standing   Standing Eyes Opened  Wide (BOA);Foam/compliant surface;5 reps;Head turns    Other Standing Exercises  alternating cone taps;  SLS with contralateral hip AROM hip flexion/extension and hip abduction /adduction with LUE support on parallel bars.         PT Education - 08/23/18 1155    Education Details  purchasing single point cane, checking with insurance on coverage  of new AFO with different design.     Person(s) Educated  Patient;Parent(s)    Methods  Explanation;Verbal cues    Comprehension  Verbalized understanding       PT Short Term Goals - 07/28/18 1313      PT SHORT TERM GOAL #1   Title  Sit to/from stand chairs without UE assist with supervision    Status  New    Target Date  09/02/18      PT SHORT TERM GOAL #2   Title  Patient ambulates with straight cane scanning environment without LOB maintaining path with supervision.     Status  New    Target Date  09/02/18      PT SHORT TERM GOAL #3   Title  Patient ambulates with straight cane & AFO >2.00 ft/sec    Status  Revised    Target Date  09/02/18        PT Long Term Goals - 07/28/18 1303      PT LONG TERM GOAL #1   Title  Patient will increase six minute walk test distance to >1000 for progression to community ambulator and improve gait ability    Baseline  585 feet 05/10/18: 542f 10/31: 6457f   Time  12    Period  Weeks    Status  On-going    Target Date  09/30/18      PT LONG TERM GOAL #2   Title  Patient will increase 10 meter walk test to >3.28 ft/sec (1.73m14m as to improve gait speed for better community ambulation and to reduce fall risk.    Baseline  1.57 ft/sec (0.48 m/sec) 05/10/18: 2.33 ft/sec (0.65m87m10/31: 2.53 ft/sec (0.98m/9m12/19: 1.87 ft/sec cane quad tip & 1.65 ft/sec no AD minA    Time  12    Period  Weeks    Status  On-going    Target Date  09/30/18      PT LONG TERM GOAL #3   Title  Patient will  increase Berg Balance score by >45/56 to demonstrate decreased fall risk during functional activities.    Baseline  25/56 05/10/18: 36/56 10/31: 39/56  12/19: 39/56    Time  12    Period  Weeks    Status  Revised    Target Date  09/30/18      PT LONG TERM GOAL #4   Title  Patient will be require no assist with ascend/descend 3 steps with single rail including on right side modified independent    Baseline  12/19: supervision with rail on left side only (which requires 2 rails to be on left side ascend & descend)    Time  12    Period  Weeks    Status  Revised    Target Date  09/30/18      PT LONG TERM GOAL #5   Title  Dynamic Gait Index with cane quad tip >12/24    Baseline  12/19: 4/24    Status  New    Target Date  09/30/18            Plan - 08/23/18 2302    Clinical Impression Statement  Patient is on target to meet STGs next week. Today's skilled session worked on gait with single point cane & AFO including scanning and narrow base / not abducting. Also PT worked on standing balance with weight shift on RLE, extension of knee and hip strategy.     Rehab Potential  Good    PT Frequency  2x / week   16 visits   PT Duration  8 weeks    PT Treatment/Interventions  Manual techniques;Patient/family education;Neuromuscular re-education;Balance training;Functional mobility training;Therapeutic activities;Therapeutic exercise;Gait training;Orthotic Fit/Training;Passive range of motion;ADLs/Self Care Home Management;Electrical Stimulation;Stair training;Energy conservation;Taping;DME Instruction    PT Next Visit Plan  continue to work SLS and RLE stregnthening/weightbearing. Gait training with straight cane normal tip. Work towards updated STGs. ? trial of different braces    PT Home Exercise Plan  Access Code: [email or web address]     Consulted and Agree with Plan of Care  Patient       Patient will benefit from skilled therapeutic intervention in order to improve the following deficits and  impairments:  Abnormal gait, Decreased balance, Decreased endurance, Decreased mobility, Difficulty walking, Impaired sensation, Increased muscle spasms, Impaired UE functional use, Impaired flexibility, Decreased strength, Decreased coordination, Decreased activity tolerance  Visit Diagnosis: Hemiplegia and hemiparesis following cerebral infarction affecting right dominant side (HCC)  Muscle weakness (generalized)  Other disturbances of skin sensation  Unsteadiness on feet  Other abnormalities of gait and mobility     Problem List Patient Active Problem List   Diagnosis Date Noted  . CVA (cerebral vascular accident) ([place]) 05/02/2018  . Neurogenic bowel   . Neurogenic bladder   . Spastic hemiparesis ([place])   . 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 Cerebral infarction due to embolism of left middle cerebral artery (HCC) s/p thrombectomy 03/01/2018  . Carotid artery dissection ([place]) Left 03/01/2018  . Acute ischemic right MCA stroke ([place]) 03/01/2018  . Right hemiparesis ([place])   . Dysphagia, post-stroke   . Global aphasia   . Leukocytosis   . Folic acid deficiency 82/80/0349  . B12 deficiency 02/23/2018  . Hyperhomocysteinemia ([place]) 02/23/2018  . Smoker     Aydian Dimmick PT, DPT 08/23/2018, 11:08 PM  [facility] [address] [place], [place], [address] Phone: [phone]   Fax:  [phone]  Name: Xavier Villegas MRN: [id number] Date of Birth: 1981/07/03
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
## 2018-08-24 ENCOUNTER — Ambulatory Visit: Payer: BLUE CROSS/BLUE SHIELD | Admitting: Adult Health

## 2018-08-24 ENCOUNTER — Encounter: Payer: Self-pay | Admitting: Adult Health

## 2018-08-24 VITALS — BP 138/85 | HR 89 | Ht 66.0 in | Wt 220.8 lb

## 2018-08-24 DIAGNOSIS — I63512 Cerebral infarction due to unspecified occlusion or stenosis of left middle cerebral artery: Secondary | ICD-10-CM | POA: Diagnosis not present

## 2018-08-24 DIAGNOSIS — R4701 Aphasia: Secondary | ICD-10-CM | POA: Diagnosis not present

## 2018-08-24 DIAGNOSIS — I10 Essential (primary) hypertension: Secondary | ICD-10-CM | POA: Diagnosis not present

## 2018-08-24 DIAGNOSIS — Z0271 Encounter for disability determination: Secondary | ICD-10-CM

## 2018-08-24 DIAGNOSIS — I7771 Dissection of carotid artery: Secondary | ICD-10-CM

## 2018-08-24 DIAGNOSIS — I69351 Hemiplegia and hemiparesis following cerebral infarction affecting right dominant side: Secondary | ICD-10-CM

## 2018-08-24 DIAGNOSIS — G8110 Spastic hemiplegia affecting unspecified side: Secondary | ICD-10-CM

## 2018-08-24 NOTE — Patient Instructions (Signed)
Continue aspirin 325 mg daily  for secondary stroke prevention  You will be called to schedule carotid ultrasound   Continue to follow up with Dr. Naaman Plummer for spasticity and baclofen use - possibly consider botox  Continue to participate in PT/OT for continued weakness  Continue to monitor blood pressure at home  Maintain strict control of hypertension with blood pressure goal below 130/90, diabetes with hemoglobin A1c goal below 6.5% and cholesterol with LDL cholesterol (bad cholesterol) goal below 70 mg/dL. I also advised the patient to eat a healthy diet with plenty of whole grains, cereals, fruits and vegetables, exercise regularly and maintain ideal body weight.  Followup in the future with me in 6 months or call earlier if needed       Thank you for coming to see [place] at [facility]. I hope we have been able to provide you high quality care today.  You may receive a patient satisfaction survey over the next few weeks. We would appreciate your feedback and comments so that we may continue to improve ourselves and the health of our patients.

## 2018-08-24 NOTE — Progress Notes (Signed)
[facility] [address] [place]. [place] [address] [phone]       OFFICE FOLLOW UP NOTE  [name] Date of Birth:  Nov 26, 1980 Medical Record Number:  [id number]   Reason for Referral:  hospital stroke follow up  CHIEF COMPLAINT:  Chief Complaint  Patient presents with  . Follow-up    Rm 9, aunt  . s/p stroke    has been doing ok, having issues with constipation.     HPI:   Interval history 08/24/2018: Patient is being seen today for 20-month follow-up visit and is accompanied by his mother.  He has been doing well since prior visit with continued post stroke deficits of right spastic hemiparesis, severe expressive aphasia and right homonymous hemianopia. He continues to work with PT/OT at our neuro rehab clinic and does endorse some improvement of his hemiparesis.  He currently is ambulating with a cane but typically will not use the cane in his home unless he is going up steps.  He is currently on baclofen to assist with spasticity and he does endorse benefit from this.  He is unable to participate in further ST sessions due to insurance reasons and plans on looking into volunteering at a speech therapy school in hopes of obtaining additional ST.  He does follow with ophthalmology for peripheral vision exams and per mother, these have been stable.  He continues on aspirin 325 mg without side effects of bleeding or bruising.  Blood pressure today satisfactory 130/85.  No further concerns at this time.  Denies new or worsening stroke/TIA symptoms.  05/17/2018 visit: Patient is being seen today for hospital follow up and is accompanied by his aunt. Underwent re-implantation of cranial flap on 05/02/18 which was tolerated well without complication.  He continues to have right hemiparesis with spasticity and expressive aphasia but does feel as though all have been improving.  He participates at our neuro rehab clinic where he receives PT/OT/ST.  He is able  to ambulate with quad cane and use of foot drop brace but denies any recent falls.  He is able to say a few words but for the most part nods head appropriately.  He also continues to have right homonymous hemianopia and is unsure if there has been any improvement.  He was discharged from hospital stay on aspirin 325 and has continued to take this without side effects of bleeding or bruising.  Blood pressure today satisfactory at 125/80.  He currently is living with his father for continued assistance but is able to bathe and dress himself independently.  Patient appears to be in good spirits regarding recovery progress and the future.  Denies any depression-like symptoms or anxiety.  No further concerns at this time.  Denies new or worsening stroke/TIA symptoms.   03/01/2018 hospital admission: Mr.Xavier Villegas a 38 y.o.malewith no significant past medical history other than tobacco usewho presented withright-sided weakness and aphasia.Hedid not receive IV t-PA due to late presentation.  CT head reviewed and showed large territory acute infarct left MCA territory.  Due to these findings, he was taken to MRI for consideration of IR if his infarct burden appears less than estimated by CTP but unfortunately due to other patient care issues there would have been a significant delay therefore patient was taken to IR for intervention. Patient underwent attempted thrombectomy for occlusive left ICA which resulted in left ICA dissection and possible ICH.  Postprocedure CT showed subarachnoid hemorrhage with hemorrhagic transformation.  MRI head reviewed  and showed large left MCA infarct with small ICH/SDH hemorrhagic conversion.  MRA head showed left ICA and MCA occlusion.  CTA head and neck showed acute dissection left internal carotid artery with intraluminal thrombus along with 2 mm midline shift.  Repeat CT showed progressive cytotoxic edema with slight mass-effect and midline shift where he  required left decompressive hemicraniotomy as well as hypertonic saline for management of edema. Repeat CT scan showed improvement after treatment. Lower extremity venous Dopplers were negative for DVT.  Carotid Doppler showed left ICA occlusion.  2D echo showed an EF of 55 to 60% with aortic valve mass versus vegetation.  TEE was normal without evidence of vegetation.  Hypercoagulable work-up did show high homocystine at 181.7 and he was started on high-dose folate and B6 as well as B12 injection.  LDL 46 and A1c 4.9.  Patient was discharged to [facility] for continuation of therapy in stable condition. Patient was discharged home on 04/06/18 with 24 hr supervision.       ROS:   14 system review of systems performed and negative with exception of speech difficulty, weakness, walking difficulty and vision loss  PMH:  Past Medical History:  Diagnosis Date  . Aphasia   . GERD (gastroesophageal reflux disease)   . Hemiparesis ([place]) 02/2018   right side  . Hyperhomocysteinemia ([place])   . Neurogenic bladder 02/2018   04/26/18 inporving  . Spastic hemiparesis (HCC)    right side  . Stroke ([place])    Left MCA infart  . Tobacco abuse     PSH:  Past Surgical History:  Procedure Laterality Date  . CRANIOTOMY Left 02/21/2018   Procedure: DECOMPRESSIVE CRANIECTOMY with bone flap to abdomen;  Surgeon: [name], MD;  Location: [facility];  Service: Neurosurgery;  Laterality: Left;  DECOMPRESSIVE CRANIECTOMY with bone flap to abdomen  . CRANIOTOMY N/A 05/02/2018   Procedure: RE-IMPLANTATION OF CRANIAL FLAP;  Surgeon: [name], MD;  Location: [place];  Service: Neurosurgery;  Laterality: N/A;  RE-IMPLANTATION OF CRANIAL FLAP  . IR ANGIO INTRA EXTRACRAN SEL COM CAROTID INNOMINATE UNI R MOD SED  02/19/2018  . IR ANGIO VERTEBRAL SEL VERTEBRAL BILAT MOD SED  02/19/2018  . IR CT HEAD LTD  02/19/2018  . IR PERCUTANEOUS ART THROMBECTOMY/INFUSION INTRACRANIAL INC DIAG ANGIO  02/19/2018  . IR US GUIDE VASC ACCESS RIGHT   02/19/2018  . RADIOLOGY WITH ANESTHESIA N/A 02/19/2018   Procedure: IR WITH ANESTHESIA CODE STROKE;  Surgeon: Corrie Mckusick, DO;  Location: [facility];  Service: Anesthesiology;  Laterality: N/A;  . TEE WITHOUT CARDIOVERSION N/A 03/01/2018   Procedure: TRANSESOPHAGEAL ECHOCARDIOGRAM (TEE);  Surgeon: Sanda Klein, MD;  Location: [facility] ENDOSCOPY;  Service: Cardiovascular;  Laterality: N/A;    Social History:  Social History   Socioeconomic History  . Marital status: Divorced    Spouse name: Not on file  . Number of children: Not on file  . Years of education: Not on file  . Highest education level: Not on file  Occupational History  . Not on file  Social Needs  . Financial resource strain: Not on file  . Food insecurity:    Worry: Not on file    Inability: Not on file  . Transportation needs:    Medical: Not on file    Non-medical: Not on file  Tobacco Use  . Smoking status: Former Smoker    Packs/day: 0.50    Years: 8.00    Pack years: 4.00    Types: Cigarettes  Last attempt to quit: 02/19/2018    Years since quitting: 0.5  . Smokeless tobacco: Never Used  Substance and Sexual Activity  . Alcohol use: Not Currently    Alcohol/week: 14.0 standard drinks    Types: 14 Cans of beer per week  . Drug use: Never  . Sexual activity: Yes  Lifestyle  . Physical activity:    Days per week: Not on file    Minutes per session: Not on file  . Stress: Not on file  Relationships  . Social connections:    Talks on phone: Not on file    Gets together: Not on file    Attends religious service: Not on file    Active member of club or organization: Not on file    Attends meetings of clubs or organizations: Not on file    Relationship status: Not on file  . Intimate partner violence:    Fear of current or ex partner: Not on file    Emotionally abused: Not on file    Physically abused: Not on file    Forced sexual activity: Not on file  Other Topics Concern  . Not on file  Social  History Narrative  . Not on file    Family History:  Family History  Problem Relation Age of Onset  . Stroke Maternal Grandmother   . Liver cancer Maternal Grandmother   . Stroke Maternal Grandfather   . Leukemia Mother        CLL/[facility]. Leretha Pol at [facility]  . Diabetes Father   . High blood pressure Father   . Liver cancer Paternal Grandmother   . Cerebral aneurysm Paternal Grandfather   . Stroke Paternal Grandfather     Medications:   Current Outpatient Medications on File Prior to Visit  Medication Sig Dispense Refill  . aspirin 325 MG tablet Take 1 tablet (325 mg total) by mouth daily.    . baclofen (LIORESAL) 10 MG tablet Take 1 tablet (10 mg total) by mouth 4 (four) times daily. 120 each 3  . cyanocobalamin 1000 MCG tablet Take 1 tablet (1,000 mcg total) by mouth daily. 30 tablet 1  . folic acid (FOLVITE) 1 MG tablet TAKE 4 TABLETS(4 MG) BY MOUTH DAILY 120 tablet 1  . OVER THE COUNTER MEDICATION Fiber capsule one capsule 1-3 daily.    . pantoprazole (PROTONIX) 40 MG tablet Take 40 mg by mouth as needed.     . polyethylene glycol (MIRALAX / GLYCOLAX) packet Take 17 g by mouth daily as needed.    . pyridOXINE (B-6) 50 MG tablet Take 1 tablet (50 mg total) by mouth daily. 30 tablet 1   No current facility-administered medications on file prior to visit.     Allergies:  No Known Allergies   Physical Exam  Vitals:   08/24/18 1301  BP: 138/85  Pulse: 89  Weight: 220 lb 12.8 oz (100.2 kg)  Height: 5\' 6"  (1.676 m)   Body mass index is 35.64 kg/m. No exam data present  General: well developed, well nourished, pleasant young Caucasian male, seated, in no evident distress Head: head normocephalic and atraumatic.   Neck: supple with no carotid or supraclavicular bruits Cardiovascular: regular rate and rhythm, no murmurs Musculoskeletal: no deformity Skin:  no rash/petichiae Vascular:  Normal pulses all extremities  Neurologic Exam Mental Status: Awake and fully alert.   Severe expressive aphasia.  Nods appropriately to yes/no questions.  Oriented to place and time. Recent and remote memory intact. Attention span, concentration and  fund of knowledge appropriate. Mood and affect appropriate.  Cranial Nerves: Fundoscopic exam reveals sharp disc margins. Pupils equal, briskly reactive to light. Extraocular movements full without nystagmus. Visual fields right homonymous hemianopia.Marland Kitchen Hearing intact. Facial sensation intact.  Mild facial paralysis right side.  Motor: Normal bulk and tone. RUE: 0/5; RLE: 4/5 hip flexor, 3/5 quad, weak ankle dorsiflexon; both with spasticity.  Full strength left upper and lower extremity Sensory.:  Decreased sensation right upper and lower extremity compared to left upper and lower extremity Coordination: Rapid alternating movements normal on left side. Finger-to-nose and heel-to-shin performed accurately on left side. Gait and Station: Arises from chair with mild difficulty. Stance is normal. Gait demonstrates hemiplegic gait with assistance of quad pain.  Reflexes: 1+ left upper and lower extremity.  2+ right upper and lower extremity.  Toes downgoing.       Diagnostic Data (Labs, Imaging, Testing)  CT ANGIO HEAD W OR WO CONTRAST 05/25/18 IMPRESSION: 1. Interval evolution of now chronic infarct involving the entirety of the left MCA distribution. 2. Left ICA occlusion near its origin. No significant distal reconstitution of the left ICA or left MCA.   ASSESSMENT: Xavier Villegas is a 38 y.o. year old male here with large left MCA infarct on 02/19/18 secondary to left ICA dissection s/p [id number] revascularization of L MCA M1/2 followed by decompressive craniectomy. Vascular risk factors include tobacco use, EtOH use, HTN and severe hyperhomocysteinemia.  Patient is being seen today for hospital follow-up and does continue to have right hemiparesis with spasticity, severe expressive aphasia and right hemianopia but all have been  improving per patient.     PLAN: -Continue aspirin 325 mg daily for secondary stroke prevention -Carotid ultrasound to follow-up on left ICA dissection/occlusion -patient that if occlusion/dissection remains, he will be referred to vascular surgery for possible further intervention -Continue to follow with Dr. Eda Keys for spasticity and baclofen use the possible consideration of Botox injections in the future -F/u with PCP regarding annual physicals and stroke risk monitoring -Continue PT/OT for continued deficits. Highly encouraged to volunteer at [facility] for additional ST therapy -continue to monitor BP at home -advised to continue to stay active at home along with home therapy exercises and maintain a healthy diet -Maintain strict control of hypertension with blood pressure goal below 130/90, diabetes with hemoglobin A1c goal below 6.5% and cholesterol with LDL cholesterol (bad cholesterol) goal below 70 mg/dL. I also advised the patient to eat a healthy diet with plenty of whole grains, cereals, fruits and vegetables, exercise regularly and maintain ideal body weight.  Follow up in 6 months or call earlier if needed   Greater than 50% of time during this 25 minute visit was spent on counseling,explanation of diagnosis of left MCA infarct, reviewing risk factor management of left ICA dissection, planning of further management, discussion with patient and family and coordination of care    Venancio Poisson, [facility]  [facility] [address] [place], [place] [address]  Phone [phone] Fax [phone] Note: This document was prepared with digital dictation and possible smart phrase technology. Any transcriptional errors that result from this process are unintentional.

## 2018-08-25 ENCOUNTER — Ambulatory Visit: Payer: BLUE CROSS/BLUE SHIELD | Admitting: Physical Therapy

## 2018-08-25 ENCOUNTER — Ambulatory Visit: Payer: BLUE CROSS/BLUE SHIELD | Admitting: Occupational Therapy

## 2018-08-25 ENCOUNTER — Encounter: Payer: Self-pay | Admitting: Physical Therapy

## 2018-08-25 DIAGNOSIS — I69351 Hemiplegia and hemiparesis following cerebral infarction affecting right dominant side: Secondary | ICD-10-CM | POA: Diagnosis not present

## 2018-08-25 DIAGNOSIS — R278 Other lack of coordination: Secondary | ICD-10-CM | POA: Diagnosis not present

## 2018-08-25 DIAGNOSIS — R208 Other disturbances of skin sensation: Secondary | ICD-10-CM

## 2018-08-25 DIAGNOSIS — R2681 Unsteadiness on feet: Secondary | ICD-10-CM | POA: Diagnosis not present

## 2018-08-25 DIAGNOSIS — R482 Apraxia: Secondary | ICD-10-CM | POA: Diagnosis not present

## 2018-08-25 DIAGNOSIS — M6281 Muscle weakness (generalized): Secondary | ICD-10-CM | POA: Diagnosis not present

## 2018-08-25 DIAGNOSIS — R2689 Other abnormalities of gait and mobility: Secondary | ICD-10-CM | POA: Diagnosis not present

## 2018-08-25 NOTE — Therapy (Signed)
[facility] [address] [place], [place], [address] Phone: [phone]   Fax:  [phone]  Physical Therapy Treatment  Patient Details  Name: Xavier Villegas MRN: [id number] Date of Birth: January 18, 1981 Referring Provider (PT): Xavier Villegas   Encounter Date: 08/25/2018  PT End of Session - 08/25/18 2118    Visit Number  25    Number of Visits  34    Authorization Type  08/23/2018: Father reports on 09/09/17 he transitions to [facility] but coverage does not change only amount they pay for premium.  BC[employer] $3250 deductible & $6350oop max have been met. Pt is covered 100%    Authorization Time Period  VL: PT & OT 30 with insurance calendar year Aug 1-July 31 with 14 reported used. However visit count [facility] notes 18 for both PT & OT.  If PT & OT same day counts as one    Authorization - Visit Number  7    Authorization - Number of Visits  16    PT Start Time  6384    PT Stop Time  1100    PT Time Calculation (min)  45 min    Equipment Utilized During Treatment  Gait belt    Activity Tolerance  Patient tolerated treatment well    Behavior During Therapy  WFL for tasks assessed/performed       Past Medical History:  Diagnosis Date  . Aphasia   . GERD (gastroesophageal reflux disease)   . Hemiparesis ([place]) 02/2018   right side  . Hyperhomocysteinemia ([place])   . Neurogenic bladder 02/2018   04/26/18 inporving  . Spastic hemiparesis (HCC)    right side  . Stroke ([place])    Left MCA infart  . Tobacco abuse     Past Surgical History:  Procedure Laterality Date  . CRANIOTOMY Left 02/21/2018   Procedure: DECOMPRESSIVE CRANIECTOMY with bone flap to abdomen;  Surgeon: Xavier Kos, MD;  Location: [facility];  Service: Neurosurgery;  Laterality: Left;  DECOMPRESSIVE CRANIECTOMY with bone flap to abdomen  . CRANIOTOMY N/A 05/02/2018   Procedure: RE-IMPLANTATION OF CRANIAL FLAP;  Surgeon: Xavier Kos, MD;  Location: [place] [facility];  Service:  Neurosurgery;  Laterality: N/A;  RE-IMPLANTATION OF CRANIAL FLAP  . IR ANGIO INTRA EXTRACRAN SEL COM CAROTID INNOMINATE UNI R MOD SED  02/19/2018  . IR ANGIO VERTEBRAL SEL VERTEBRAL BILAT MOD SED  02/19/2018  . IR CT HEAD LTD  02/19/2018  . IR PERCUTANEOUS ART THROMBECTOMY/INFUSION INTRACRANIAL INC DIAG ANGIO  02/19/2018  . IR US GUIDE VASC ACCESS RIGHT  02/19/2018  . RADIOLOGY WITH ANESTHESIA N/A 02/19/2018   Procedure: IR WITH ANESTHESIA CODE STROKE;  Surgeon: Xavier Mckusick, DO;  Location: [place];  Service: Anesthesiology;  Laterality: N/A;  . TEE WITHOUT CARDIOVERSION N/A 03/01/2018   Procedure: TRANSESOPHAGEAL ECHOCARDIOGRAM (TEE);  Surgeon: Xavier Klein, MD;  Location: [facility] ENDOSCOPY;  Service: Cardiovascular;  Laterality: N/A;    There were no vitals filed for this visit.  Subjective Assessment - 08/25/18 1015    Subjective  No falls. His father ordered cane online and should be delivered today or tomorrow. His father still is planning to have Xavier Villegas at [employer] office check on insurance coverage of new AFO.     Pertinent History  Xavier Villegas  had left MCA infarct and subsequent craniectomy.  He was discharged from rehab several weeks ago. He has been home with his family  He is having occasional spasms on the right side still.Patient has Right  hemiparesis, dysphagia, and aphasia secondary to large left MCA infarct with hemorrhagic transformation s/p hemicraniectomy.He had acute ischemic left MCA infarct and subsequent craniotomy - was first admitted on 03/01/18.  He was discharged from hospital on 04/06/18    How long can you stand comfortably?  stand for 20 mins    How long can you walk comfortably?  10 mins    Patient Stated Goals  to walk without AD and live on his own.    Currently in Pain?  No/denies                       [facility] Adult PT Treatment/Exercise - 08/25/18 1015      Transfers   Transfers  Sit to Stand;Stand to Sit;Floor to Transfer    Sit to Stand  5:  Supervision;Without upper extremity assist;From chair/3-in-1    Stand to Sit  5: Supervision;With upper extremity assist;To chair/3-in-1      Ambulation/Gait   Ambulation/Gait  Yes    Ambulation/Gait Assistance  5: Supervision    Ambulation/Gait Assistance Details  tactile & verbal cues on step width /not abducting.  Family brought positioning ankle splint that he wears at night. He walks to bathroom in positional splint. They also report that he walks around house without device.  PT assessed gait with AFO without device: no LOB with increased deviations.  PT assessed barefoot gait 20' to simulate night time gait to bathroom: Pt had increased deviations including ankle plantarflexion but did not supinate and no LOB.      Ambulation Distance (Feet)  500 Feet   500' cane/AFO,  100' X 2 AFO only, 25' X 2 barefoot    Assistive device  Straight cane;Other (Comment);None   AFO   Gait Pattern  Step-through pattern;Decreased arm swing - right;Decreased stance time - right;Decreased stride length;Decreased weight shift to right;Right hip hike;Right steppage;Right foot flat;Right flexed knee in stance;Antalgic;Lateral hip instability;Trunk flexed;Abducted- right;Decreased step length - left    Ambulation Surface  Indoor;Level    Stairs  --    Stairs Assistance  --    Stair Management Technique  --    Number of Stairs  --    Ramp  5: Supervision   min guard / tactile cues straight cane & AFO   Curb  4: Min assist;5: Supervision   Min guard   Gait Comments  --      High Level Balance   High Level Balance Activities  Head turns;Negotitating around obstacles    High Level Balance Comments  tactile & verbal cues on maintaining path wtih scanning      Exercises   Other Exercises   --      Ankle Exercises: Stretches   Gastroc Stretch  2 reps;20 seconds   standing on step with posterior right foot off step   Gastroc Stretch Limitations  PT demo, instructed and pt return demo with verbal cues           Balance Exercises - 08/25/18 2117      Balance Exercises: Standing   Other Standing Exercises  alternating cone taps;  SLS with contralateral hip AROM hip flexion/extension and hip abduction /adduction with LUE support on parallel bars.           PT Short Term Goals - 08/25/18 2119      PT SHORT TERM GOAL #1   Title  Sit to/from stand chairs without UE assist with supervision    Status  On-going  Target Date  09/02/18      PT SHORT TERM GOAL #2   Title  Patient ambulates with straight cane scanning environment without LOB maintaining path with supervision.     Status  On-going    Target Date  09/02/18      PT SHORT TERM GOAL #3   Title  Patient ambulates with straight cane & AFO >2.00 ft/sec    Status  On-going    Target Date  09/02/18        PT Long Term Goals - 07/28/18 1303      PT LONG TERM GOAL #1   Title  Patient will increase six minute walk test distance to >1000 for progression to community ambulator and improve gait ability    Baseline  585 feet 05/10/18: 530f 10/31: 6443f   Time  12    Period  Weeks    Status  On-going    Target Date  09/30/18      PT LONG TERM GOAL #2   Title  Patient will increase 10 meter walk test to >3.28 ft/sec (1.34m35m as to improve gait speed for better community ambulation and to reduce fall risk.    Baseline  1.57 ft/sec (0.48 m/sec) 05/10/18: 2.33 ft/sec (0.63m64m10/31: 2.53 ft/sec (0.38m/9m12/19: 1.87 ft/sec cane quad tip & 1.65 ft/sec no AD minA    Time  12    Period  Weeks    Status  On-going    Target Date  09/30/18      PT LONG TERM GOAL #3   Title  Patient will increase Berg Balance score by >45/56 to demonstrate decreased fall risk during functional activities.    Baseline  25/56 05/10/18: 36/56 10/31: 39/56  12/19: 39/56    Time  12    Period  Weeks    Status  Revised    Target Date  09/30/18      PT LONG TERM GOAL #4   Title  Patient will be require no assist with ascend/descend 3 steps with  single rail including on right side modified independent    Baseline  12/19: supervision with rail on left side only (which requires 2 rails to be on left side ascend & descend)    Time  12    Period  Weeks    Status  Revised    Target Date  09/30/18      PT LONG TERM GOAL #5   Title  Dynamic Gait Index with cane quad tip >12/24    Baseline  12/19: 4/24    Status  New    Target Date  09/30/18            Plan - 08/25/18 2120    Clinical Impression Statement  PT assessed positioning splint that he is using at night to stretch his ankle. The splint does not appear to stretch his ankle; His ankle is stretched with standing, gait & exercises so position splint does not appear necessary at this point.  Patient is on target to meet STGs next week.     Rehab Potential  Good    PT Frequency  2x / week   16 visits   PT Duration  8 weeks    PT Treatment/Interventions  Manual techniques;Patient/family education;Neuromuscular re-education;Balance training;Functional mobility training;Therapeutic activities;Therapeutic exercise;Gait training;Orthotic Fit/Training;Passive range of motion;ADLs/Self Care Home Management;Electrical Stimulation;Stair training;Energy conservation;Taping;DME Instruction    PT Next Visit Plan  check STGs,  continue to work SLS and RLE stregnthening/weightbearing. Gait  training with straight cane normal tip.    PT Home Exercise Plan  Access Code: [email or web address]     Consulted and Agree with Plan of Care  Patient       Patient will benefit from skilled therapeutic intervention in order to improve the following deficits and impairments:  Abnormal gait, Decreased balance, Decreased endurance, Decreased mobility, Difficulty walking, Impaired sensation, Increased muscle spasms, Impaired UE functional use, Impaired flexibility, Decreased strength, Decreased coordination, Decreased activity tolerance  Visit Diagnosis: Hemiplegia and hemiparesis following cerebral infarction affecting  right dominant side (HCC)  Muscle weakness (generalized)  Unsteadiness on feet  Other abnormalities of gait and mobility     Problem List Patient Active Problem List   Diagnosis Date Noted  . CVA (cerebral vascular accident) ([place]) 05/02/2018  . Neurogenic bowel   . Neurogenic bladder   . Spastic hemiparesis ([place])   . 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 Cerebral infarction due to embolism of left middle cerebral artery (HCC) s/p thrombectomy 03/01/2018  . Carotid artery dissection ([place]) Left 03/01/2018  . Acute ischemic right MCA stroke ([place]) 03/01/2018  . Right hemiparesis ([place])   . Dysphagia, post-stroke   . Global aphasia   . Leukocytosis   . Folic acid deficiency 31/54/0086  . B12 deficiency 02/23/2018  . Hyperhomocysteinemia ([place]) 02/23/2018  . Smoker     Shonte Soderlund PT, DPT 08/25/2018, 9:25 PM  [facility] [address] [place], [place], [address] Phone: [phone]   Fax:  [phone]  Name: Xavier Villegas MRN: [id number] Date of Birth: 07/11/1981
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
## 2018-08-25 NOTE — Progress Notes (Signed)
I agree with the above plan 

## 2018-08-29 ENCOUNTER — Encounter: Payer: Self-pay | Admitting: Physical Medicine & Rehabilitation

## 2018-08-29 ENCOUNTER — Encounter
Payer: BLUE CROSS/BLUE SHIELD | Attending: Physical Medicine & Rehabilitation | Admitting: Physical Medicine & Rehabilitation

## 2018-08-29 VITALS — BP 148/84 | HR 81 | Resp 14 | Ht 66.0 in | Wt 221.0 lb

## 2018-08-29 DIAGNOSIS — G8110 Spastic hemiplegia affecting unspecified side: Secondary | ICD-10-CM

## 2018-08-29 DIAGNOSIS — Z9889 Other specified postprocedural states: Secondary | ICD-10-CM | POA: Insufficient documentation

## 2018-08-29 DIAGNOSIS — N319 Neuromuscular dysfunction of bladder, unspecified: Secondary | ICD-10-CM | POA: Diagnosis not present

## 2018-08-29 DIAGNOSIS — Z87891 Personal history of nicotine dependence: Secondary | ICD-10-CM | POA: Diagnosis not present

## 2018-08-29 DIAGNOSIS — I69391 Dysphagia following cerebral infarction: Secondary | ICD-10-CM | POA: Insufficient documentation

## 2018-08-29 DIAGNOSIS — I63412 Cerebral infarction due to embolism of left middle cerebral artery: Secondary | ICD-10-CM | POA: Diagnosis not present

## 2018-08-29 DIAGNOSIS — I69320 Aphasia following cerebral infarction: Secondary | ICD-10-CM | POA: Diagnosis not present

## 2018-08-29 DIAGNOSIS — I69351 Hemiplegia and hemiparesis following cerebral infarction affecting right dominant side: Secondary | ICD-10-CM | POA: Diagnosis not present

## 2018-08-29 DIAGNOSIS — K592 Neurogenic bowel, not elsewhere classified: Secondary | ICD-10-CM | POA: Diagnosis not present

## 2018-08-29 NOTE — Progress Notes (Signed)
Subjective:    Patient ID: Xavier Villegas, male    DOB: Dec 16, 1980, 38 y.o.   MRN: [id number]  HPI   Mr. Hoefle is here in follow up of his spastic right hemiparesis.  He had nice results with the Botox injections of his right wrist and finger flexors as well as arm forearm pronators last month.  He has continued to work on stretching at home and is involved in outpatient therapies as well.  They tell me that therapy has mentioned a new brace for him and father wanted to come today to get our opinion regarding the brace.  He wanted to see if there is anything we do from a prior authorization standpoint as well.  Currently is in a carbon, blue rocker brace.  Otherwise he has been doing quite well.  He has been active at home.  He exercises daily. Marland Kitchen  He is engaged in his home exercise program  Pain Inventory Average Pain 0 Pain Right Now 0 My pain is no pain  In the last 24 hours, has pain interfered with the following? General activity 0 Relation with others 0 Enjoyment of life 0 What TIME of day is your pain at its worst? no pain Sleep (in general) Good  Pain is worse with: no pain Pain improves with: no pain Relief from Meds: no pain  Mobility walk with assistance use a cane ability to climb steps?  yes do you drive?  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  no  Physicians involved in your care Any changes since last visit?  no   Family History  Problem Relation Age of Onset  . Stroke Maternal Grandmother   . Liver cancer Maternal Grandmother   . Stroke Maternal Grandfather   . Leukemia Mother        CLL/[facility]. Leretha Pol at [facility]  . Diabetes Father   . High blood pressure Father   . Liver cancer Paternal Grandmother   .  Cerebral aneurysm Paternal Grandfather   . Stroke Paternal Grandfather    Social History   Socioeconomic History  . Marital status: Divorced    Spouse name: Not on file  . Number of children: Not on file  . Years of education: Not on file  . Highest education level: Not on file  Occupational History  . Not on file  Social Needs  . Financial resource strain: Not on file  . Food insecurity:    Worry: Not on file    Inability: Not on file  . Transportation needs:    Medical: Not on file    Non-medical: Not on file  Tobacco Use  . Smoking status: Former Smoker    Packs/day: 0.50    Years: 8.00    Pack years: 4.00    Types: Cigarettes    Last attempt to quit: 02/19/2018    Years since quitting: 0.5  . Smokeless tobacco: Never Used  Substance and Sexual Activity  . Alcohol use: Not Currently    Alcohol/week: 14.0 standard drinks    Types: 14 Cans of beer per week  . Drug use: Never  . Sexual activity: Yes  Lifestyle  . Physical activity:    Days per week: Not on file    Minutes per session: Not on file  .  Stress: Not on file  Relationships  . Social connections:    Talks on phone: Not on file    Gets together: Not on file    Attends religious service: Not on file    Active member of club or organization: Not on file    Attends meetings of clubs or organizations: Not on file    Relationship status: Not on file  Other Topics Concern  . Not on file  Social History Narrative  . Not on file   Past Surgical History:  Procedure Laterality Date  . CRANIOTOMY Left 02/21/2018   Procedure: DECOMPRESSIVE CRANIECTOMY with bone flap to abdomen;  Surgeon: [name], MD;  Location: [place];  Service: Neurosurgery;  Laterality: Left;  DECOMPRESSIVE CRANIECTOMY with bone flap to abdomen  . CRANIOTOMY N/A 05/02/2018   Procedure: RE-IMPLANTATION OF CRANIAL FLAP;  Surgeon: [name], MD;  Location: [place];  Service: Neurosurgery;  Laterality: N/A;  RE-IMPLANTATION OF CRANIAL FLAP  . IR  ANGIO INTRA EXTRACRAN SEL COM CAROTID INNOMINATE UNI R MOD SED  02/19/2018  . IR ANGIO VERTEBRAL SEL VERTEBRAL BILAT MOD SED  02/19/2018  . IR CT HEAD LTD  02/19/2018  . IR PERCUTANEOUS ART THROMBECTOMY/INFUSION INTRACRANIAL INC DIAG ANGIO  02/19/2018  . IR US GUIDE VASC ACCESS RIGHT  02/19/2018  . RADIOLOGY WITH ANESTHESIA N/A 02/19/2018   Procedure: IR WITH ANESTHESIA CODE STROKE;  Surgeon: Corrie Mckusick, DO;  Location: [facility];  Service: Anesthesiology;  Laterality: N/A;  . TEE WITHOUT CARDIOVERSION N/A 03/01/2018   Procedure: TRANSESOPHAGEAL ECHOCARDIOGRAM (TEE);  Surgeon: Sanda Klein, MD;  Location: [facility] ENDOSCOPY;  Service: Cardiovascular;  Laterality: N/A;   Past Medical History:  Diagnosis Date  . Aphasia   . GERD (gastroesophageal reflux disease)   . Hemiparesis ([place]) 02/2018   right side  . Hyperhomocysteinemia ([place])   . Neurogenic bladder 02/2018   04/26/18 inporving  . Spastic hemiparesis (HCC)    right side  . Stroke ([place])    Left MCA infart  . Tobacco abuse    BP (!) 148/84   Pulse 81   Resp 14   Ht 5\' 6"  (1.676 m)   Wt 221 lb (100.2 kg)   SpO2 96%   BMI 35.67 kg/m   Opioid Risk Score:   Fall Risk Score:  `1  Depression screen PHQ 2/9  Depression screen [facility] 2/9 07/11/2018 04/18/2018  Decreased Interest 0 0  Down, Depressed, Hopeless 0 0  PHQ - 2 Score 0 0    Review of Systems  Constitutional: Negative.   HENT: Negative.   Eyes: Negative.   Respiratory: Negative.   Cardiovascular: Negative.   Gastrointestinal: Positive for constipation.  Endocrine: Negative.   Genitourinary: Negative.   Musculoskeletal: Positive for gait problem.  Allergic/Immunologic: Negative.   Neurological: Positive for numbness.  Hematological: Negative.   Psychiatric/Behavioral: Negative.        Objective:   Physical Exam General: No acute distress HEENT: EOMI, oral membranes moist Cards: reg rate  Chest: normal effort Abdomen: Soft, NT, ND Skin: dry, intact Extremities:  no edema   Neuro:  Continued expressive aphasia.  He does communicate with yes and no answers and does reply in short phrases at times and single words..  Right upper extremity remains 1 out of 5 proximal distal.  He has increased tone in the hand pronators as well as the wrist and finger flexors which are 1 out of 4 at the wrist, finger flexors as well as wrist pronators. r.  Right lower extremity grossly 2-3 out of 5 at the hip and knee.  Ankle dorsiflexion plantarflexion 1 out of 5.    Tone at the hamstrings is 1-2 out of 4, ankle plantar flexors and inverters are 2-3 out of 4.  Patient's gait has improved with less circumduction noted today.  He still struggles to achieve heel strike during gait.  L. Musculoskeletal:Full ROM, No pain with AROM or PROM in the neck, trunk, or extremities. Posture appropriate Psych:Pt's affect is appropriate. Pt is cooperative     Assessment & Plan:  Medical Problem List and Plan: 1. Right hemiparesis, dysphagia, and aphasia secondary to large left MCA infarct with hemorrhagic transformation s/p hemicraniectomy. -Continue with outpatient PT OT and speech.  He is making gradual gains.   2.Spasticity:  Good results after Botox to right upper extremity. -continue baclofen 10mg  QID -continue splint/ROM without      -Therapy had discussed new bracing for him.  I am not sure what they are thinking of at this point.  I think it would be more prudent to address the tone in the ankle plantar flexors.  Along those lines we will set him up for phenol injections to the right tibial nerve.  After the phenol injections we can look at any bracing options that might benefit him.   . 4. High homocysteine levels/low folate levels/boderline low E75:[id number] dose folic supplement, B6 and received B12 inj 7/14 . MRHFR--heterozygous /single mutation ID.  -Recommendations per neurology 5. Neurogenic  bladder/bowel:improved -incontinence bladder improved   Follow-up with me in about 2 weeks for above phenol injections15 minutes of face to face patient care time were spent during this visit. All questions were encouraged and answered.

## 2018-08-29 NOTE — Patient Instructions (Signed)
CONTINUE TO STRETCH EVERY DAY!!

## 2018-08-30 ENCOUNTER — Ambulatory Visit: Payer: BLUE CROSS/BLUE SHIELD | Admitting: Physical Therapy

## 2018-08-30 ENCOUNTER — Ambulatory Visit: Payer: BLUE CROSS/BLUE SHIELD | Admitting: Occupational Therapy

## 2018-09-01 ENCOUNTER — Ambulatory Visit: Payer: BLUE CROSS/BLUE SHIELD | Admitting: Occupational Therapy

## 2018-09-01 ENCOUNTER — Ambulatory Visit: Payer: BLUE CROSS/BLUE SHIELD | Admitting: Physical Therapy

## 2018-09-01 ENCOUNTER — Encounter: Payer: Self-pay | Admitting: Physical Therapy

## 2018-09-01 DIAGNOSIS — R2681 Unsteadiness on feet: Secondary | ICD-10-CM

## 2018-09-01 DIAGNOSIS — R2689 Other abnormalities of gait and mobility: Secondary | ICD-10-CM

## 2018-09-01 DIAGNOSIS — R208 Other disturbances of skin sensation: Secondary | ICD-10-CM

## 2018-09-01 DIAGNOSIS — M6281 Muscle weakness (generalized): Secondary | ICD-10-CM

## 2018-09-01 DIAGNOSIS — I69351 Hemiplegia and hemiparesis following cerebral infarction affecting right dominant side: Secondary | ICD-10-CM

## 2018-09-01 DIAGNOSIS — R482 Apraxia: Secondary | ICD-10-CM | POA: Diagnosis not present

## 2018-09-01 DIAGNOSIS — R278 Other lack of coordination: Secondary | ICD-10-CM | POA: Diagnosis not present
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
## 2018-09-02 NOTE — Therapy (Signed)
[facility] [address] [place], [place], [address] Phone: [phone]   Fax:  [phone]  Physical Therapy Treatment  Patient Details  Name: Xavier Villegas MRN: [id number] Date of Birth: [birth year] Referring Provider (PT): Reesa Chew   Encounter Date: 09/01/2018  PT End of Session - 09/01/18 1410    Visit Number  26    Number of Visits  34    Authorization Type  08/23/2018: Father reports on 09/09/17 he transitions to [facility] but coverage does not change only amount they pay for premium.  [employer] $3250 deductible & $6350oop max have been met. Pt is covered 100%    Authorization Time Period  VL: PT & OT 30 with insurance calendar year Aug 1-July 31 with 14 reported used. However visit count [facility] notes 18 for both PT & OT.  If PT & OT same day counts as one    Authorization - Visit Number  8    Authorization - Number of Visits  16    PT Start Time  0102    PT Stop Time  1400    PT Time Calculation (min)  45 min    Equipment Utilized During Treatment  Gait belt    Activity Tolerance  Patient tolerated treatment well    Behavior During Therapy  WFL for tasks assessed/performed       Past Medical History:  Diagnosis Date  . Aphasia   . GERD (gastroesophageal reflux disease)   . Hemiparesis ([place]) 02/2018   right side  . Hyperhomocysteinemia ([place])   . Neurogenic bladder 02/2018   04/26/18 inporving  . Spastic hemiparesis (HCC)    right side  . Stroke ([place])    Left MCA infart  . Tobacco abuse     Past Surgical History:  Procedure Laterality Date  . CRANIOTOMY Left 02/21/2018   Procedure: DECOMPRESSIVE CRANIECTOMY with bone flap to abdomen;  Surgeon: [name], MD;  Location: [facility];  Service: Neurosurgery;  Laterality: Left;  DECOMPRESSIVE CRANIECTOMY with bone flap to abdomen  . CRANIOTOMY N/A 05/02/2018   Procedure: RE-IMPLANTATION OF CRANIAL FLAP;  Surgeon: [name], MD;  Location: [facility];  Service:  Neurosurgery;  Laterality: N/A;  RE-IMPLANTATION OF CRANIAL FLAP  . IR ANGIO INTRA EXTRACRAN SEL COM CAROTID INNOMINATE UNI R MOD SED  02/19/2018  . IR ANGIO VERTEBRAL SEL VERTEBRAL BILAT MOD SED  02/19/2018  . IR CT HEAD LTD  02/19/2018  . IR PERCUTANEOUS ART THROMBECTOMY/INFUSION INTRACRANIAL INC DIAG ANGIO  02/19/2018  . IR US GUIDE VASC ACCESS RIGHT  02/19/2018  . RADIOLOGY WITH ANESTHESIA N/A 02/19/2018   Procedure: IR WITH ANESTHESIA CODE STROKE;  Surgeon: Corrie Mckusick, DO;  Location: [facility];  Service: Anesthesiology;  Laterality: N/A;  . TEE WITHOUT CARDIOVERSION N/A 03/01/2018   Procedure: TRANSESOPHAGEAL ECHOCARDIOGRAM (TEE);  Surgeon: Sanda Klein, MD;  Location[facility] ENDOSCOPY;  Service: Cardiovascular;  Laterality: N/A;    There were no vitals filed for this visit.  Subjective Assessment - 09/01/18 1315    Subjective  His aunt reports Dr. Naaman Plummer wants to wait on new AFO. He gave him a shot in back of knee and advised to stretch.     Pertinent History  Mr. Kapur  had left MCA infarct and subsequent craniectomy.  He was discharged from rehab several weeks ago. He has been home with his family  He is having occasional spasms on the right side still.Patient has Right hemiparesis, dysphagia, and aphasia secondary to large left  MCA infarct with hemorrhagic transformation s/p hemicraniectomy.He had acute ischemic left MCA infarct and subsequent craniotomy - was first admitted on 03/01/18.  He was discharged from hospital on 04/06/18    How long can you stand comfortably?  stand for 20 mins    How long can you walk comfortably?  10 mins    Patient Stated Goals  to walk without AD and live on his own.    Currently in Pain?  No/denies                       [facility] Adult PT Treatment/Exercise - 09/01/18 1315      Transfers   Transfers  Sit to Stand;Stand to Sit;Floor to Transfer    Sit to Stand  5: Supervision;Without upper extremity assist;From chair/3-in-1    Stand to Sit  5:  Supervision;With upper extremity assist;To chair/3-in-1      Ambulation/Gait   Ambulation/Gait  Yes    Ambulation/Gait Assistance  5: Supervision    Ambulation/Gait Assistance Details  worked on decreasing abduction including ambulating between cones initially placed 24" apart and progressed to 18" apart.     Ambulation Distance (Feet)  500 Feet   500' indoors & 300' outdoors 150' on grass   Assistive device  Straight cane;Other (Comment)   AFO   Gait Pattern  Step-through pattern;Decreased arm swing - right;Decreased stance time - right;Decreased stride length;Decreased weight shift to right;Right hip hike;Right steppage;Right foot flat;Right flexed knee in stance;Antalgic;Lateral hip instability;Trunk flexed;Abducted- right;Decreased step length - left    Ambulation Surface  Indoor;Level;Outdoor;Grass;Gravel    Gait velocity  2.12 ft/sec    Stairs  Yes    Stairs Assistance  5: Supervision;4: Min guard    Stairs Assistance Details (indicate cue type and reason)  RLE control/ motion in line of progression    Stair Management Technique  One rail Left;Alternating pattern;Forwards    Number of Stairs  4   3 reps   Ramp  5: Supervision   tactile cues with straight cane & AFO   Ramp Details (indicate cue type and reason)  verbal cues on upright posture, step width & step length    Curb  5: Supervision   cane & AFO   Curb Details (indicate cue type and reason)  verbal cues on sequence & step length      High Level Balance   High Level Balance Activities  Head turns;Negotitating around obstacles    High Level Balance Comments  tactile & verbal cues on maintaining path wtih scanning      Ankle Exercises: Stretches   Gastroc Stretch  2 reps;20 seconds   standing on step with posterior right foot off step   Gastroc Stretch Limitations  PT demo, instructed and pt return demo with verbal cues          Balance Exercises - 09/01/18 1315      Balance Exercises: Standing   Standing Eyes  Opened  Wide (BOA);Foam/compliant surface;Head turns;5 reps   manual cues on RLE for alignment / weight bearing   Other Standing Exercises  alternating cone taps;  SLS with contralateral hip AROM hip flexion/extension and hip abduction /adduction with LUE support on parallel bars.           PT Short Term Goals - 09/01/18 2300      PT SHORT TERM GOAL #1   Title  Sit to/from stand chairs without UE assist with supervision    Baseline  MET 09/01/2018  Status  Achieved      PT SHORT TERM GOAL #2   Title  Patient ambulates with straight cane scanning environment without LOB maintaining path with supervision.     Baseline  MET 09/01/2018    Status  Achieved      PT SHORT TERM GOAL #3   Title  Patient ambulates with straight cane & AFO >2.00 ft/sec    Baseline  MET 09/01/2018    Status  Achieved        PT Long Term Goals - 07/28/18 1303      PT LONG TERM GOAL #1   Title  Patient will increase six minute walk test distance to >1000 for progression to community ambulator and improve gait ability    Baseline  585 feet 05/10/18: 524f 10/31: 6436f   Time  12    Period  Weeks    Status  On-going    Target Date  09/30/18      PT LONG TERM GOAL #2   Title  Patient will increase 10 meter walk test to >3.28 ft/sec (1.36m22m as to improve gait speed for better community ambulation and to reduce fall risk.    Baseline  1.57 ft/sec (0.48 m/sec) 05/10/18: 2.33 ft/sec (0.39m16m10/31: 2.53 ft/sec (0.50m/62m12/19: 1.87 ft/sec cane quad tip & 1.65 ft/sec no AD minA    Time  12    Period  Weeks    Status  On-going    Target Date  09/30/18      PT LONG TERM GOAL #3   Title  Patient will increase Berg Balance score by >45/56 to demonstrate decreased fall risk during functional activities.    Baseline  25/56 05/10/18: 36/56 10/31: 39/56  12/19: 39/56    Time  12    Period  Weeks    Status  Revised    Target Date  09/30/18      PT LONG TERM GOAL #4   Title  Patient will be require no assist  with ascend/descend 3 steps with single rail including on right side modified independent    Baseline  12/19: supervision with rail on left side only (which requires 2 rails to be on left side ascend & descend)    Time  12    Period  Weeks    Status  Revised    Target Date  09/30/18      PT LONG TERM GOAL #5   Title  Dynamic Gait Index with cane quad tip >12/24    Baseline  12/19: 4/24    Status  New    Target Date  09/30/18            Plan - 09/01/18 2300    Clinical Impression Statement  Patient met STGs. Today's skilled session focused on stretching ankles, neuromuscular re-education / balance activities with RLE control and gait working on not abducting including outdoors on grass.     Rehab Potential  Good    PT Frequency  2x / week   16 visits   PT Duration  8 weeks    PT Treatment/Interventions  Manual techniques;Patient/family education;Neuromuscular re-education;Balance training;Functional mobility training;Therapeutic activities;Therapeutic exercise;Gait training;Orthotic Fit/Training;Passive range of motion;ADLs/Self Care Home Management;Electrical Stimulation;Stair training;Energy conservation;Taping;DME Instruction    PT Next Visit Plan  work towards LTGs,[place] to work SLS and RLE stregnthening/weightbearing. Gait training with straight cane normal tip.    PT Home Exercise Plan  Access Code: [email or web address]     Consulted and Agree  with Plan of Care  Patient       Patient will benefit from skilled therapeutic intervention in order to improve the following deficits and impairments:  Abnormal gait, Decreased balance, Decreased endurance, Decreased mobility, Difficulty walking, Impaired sensation, Increased muscle spasms, Impaired UE functional use, Impaired flexibility, Decreased strength, Decreased coordination, Decreased activity tolerance  Visit Diagnosis: Hemiplegia and hemiparesis following cerebral infarction affecting right dominant side (HCC)  Muscle weakness  (generalized)  Unsteadiness on feet  Other abnormalities of gait and mobility  Other disturbances of skin sensation     Problem List Patient Active Problem List   Diagnosis Date Noted  . CVA (cerebral vascular accident) ([place]) 05/02/2018  . Neurogenic bowel   . Neurogenic bladder   . Spastic hemiparesis ([place])   . 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 Cerebral infarction due to embolism of left middle cerebral artery (HCC) s/p thrombectomy 03/01/2018  . Carotid artery dissection ([place]) Left 03/01/2018  . Acute ischemic right MCA stroke ([place]) 03/01/2018  . Right hemiparesis ([place])   . Dysphagia, post-stroke   . Global aphasia   . Leukocytosis   . Folic acid deficiency 56/43/3295  . B12 deficiency 02/23/2018  . Hyperhomocysteinemia ([place]) 02/23/2018  . Smoker     WALDRON,ROBIN PT, DPT 09/02/2018, 12:52 AM  [facility] [address] [place], [place], [address] Phone: [phone]   Fax:  [phone]  Name: Diquan Kassis MRN: [id number] Date of Birth: June 01, 1981

## 2018-09-06 ENCOUNTER — Ambulatory Visit: Payer: BLUE CROSS/BLUE SHIELD | Admitting: Physical Therapy

## 2018-09-06 ENCOUNTER — Ambulatory Visit: Payer: BLUE CROSS/BLUE SHIELD | Admitting: Occupational Therapy

## 2018-09-06 ENCOUNTER — Encounter: Payer: Self-pay | Admitting: Physical Therapy

## 2018-09-06 DIAGNOSIS — M6281 Muscle weakness (generalized): Secondary | ICD-10-CM | POA: Diagnosis not present

## 2018-09-06 DIAGNOSIS — R482 Apraxia: Secondary | ICD-10-CM | POA: Diagnosis not present

## 2018-09-06 DIAGNOSIS — R2681 Unsteadiness on feet: Secondary | ICD-10-CM

## 2018-09-06 DIAGNOSIS — R2689 Other abnormalities of gait and mobility: Secondary | ICD-10-CM | POA: Diagnosis not present

## 2018-09-06 DIAGNOSIS — R208 Other disturbances of skin sensation: Secondary | ICD-10-CM | POA: Diagnosis not present

## 2018-09-06 DIAGNOSIS — I69351 Hemiplegia and hemiparesis following cerebral infarction affecting right dominant side: Secondary | ICD-10-CM

## 2018-09-06 DIAGNOSIS — R278 Other lack of coordination: Secondary | ICD-10-CM | POA: Diagnosis not present
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
## 2018-09-07 ENCOUNTER — Ambulatory Visit: Payer: BLUE CROSS/BLUE SHIELD | Admitting: Physical Medicine & Rehabilitation

## 2018-09-07 NOTE — Therapy (Signed)
[facility] [address] [place], [place], [address] Phone: [phone]   Fax:  [phone]  Physical Therapy Treatment  Patient Details  Name: Xavier Villegas MRN: [id number] Date of Birth: May 11, 1981 Referring Provider (PT): Reesa Chew   Encounter Date: 09/06/2018   09/06/18 1104  PT Visits / Re-Eval  Visit Number 27  Number of Visits 34  Authorization  Authorization Type 08/23/2018: Father reports on 09/09/17 he transitions to [facility] but coverage does not change only amount they pay for premium.  BCBS $3250 deductible & $6350oop max have been met. Pt is covered 100%  Authorization Time Period VL: PT & OT 30 with insurance calendar year Aug 1-July 31 with 14 reported used. However visit count [facility] notes 18 for both PT & OT.  If PT & OT same day counts as one  Authorization - Visit Number 9  Authorization - Number of Visits 16  PT Time Calculation  PT Start Time 1102  PT Stop Time 1145  PT Time Calculation (min) 43 min  PT - End of Session  Equipment Utilized During Treatment Gait belt  Activity Tolerance Patient tolerated treatment well  Behavior During Therapy WFL for tasks assessed/performed      Past Medical History:  Diagnosis Date  . Aphasia   . GERD (gastroesophageal reflux disease)   . Hemiparesis ([place]) 02/2018   right side  . Hyperhomocysteinemia ([place])   . Neurogenic bladder 02/2018   04/26/18 inporving  . Spastic hemiparesis (HCC)    right side  . Stroke ([place])    Left MCA infart  . Tobacco abuse     Past Surgical History:  Procedure Laterality Date  . CRANIOTOMY Left 02/21/2018   Procedure: DECOMPRESSIVE CRANIECTOMY with bone flap to abdomen;  Surgeon: [name], MD;  Location: [facility];  Service: Neurosurgery;  Laterality: Left;  DECOMPRESSIVE CRANIECTOMY with bone flap to abdomen  . CRANIOTOMY N/A 05/02/2018   Procedure: RE-IMPLANTATION OF CRANIAL FLAP;  Surgeon: [name], MD;   Location: [facility];  Service: Neurosurgery;  Laterality: N/A;  RE-IMPLANTATION OF CRANIAL FLAP  . IR ANGIO INTRA EXTRACRAN SEL COM CAROTID INNOMINATE UNI R MOD SED  02/19/2018  . IR ANGIO VERTEBRAL SEL VERTEBRAL BILAT MOD SED  02/19/2018  . IR CT HEAD LTD  02/19/2018  . IR PERCUTANEOUS ART THROMBECTOMY/INFUSION INTRACRANIAL INC DIAG ANGIO  02/19/2018  . IR US GUIDE VASC ACCESS RIGHT  02/19/2018  . RADIOLOGY WITH ANESTHESIA N/A 02/19/2018   Procedure: IR WITH ANESTHESIA CODE STROKE;  Surgeon: Corrie Mckusick, DO;  Location: [facility];  Service: Anesthesiology;  Laterality: N/A;  . TEE WITHOUT CARDIOVERSION N/A 03/01/2018   Procedure: TRANSESOPHAGEAL ECHOCARDIOGRAM (TEE);  Surgeon: Sanda Klein, MD;  Location: [facility] ENDOSCOPY;  Service: Cardiovascular;  Laterality: N/A;    There were no vitals filed for this visit.     09/06/18 1103  Symptoms/Limitations  Subjective Has not had the injection from [facility] yet, it is scheduled for 09/14/18. No falls or pain to report.   Pertinent History Mr. Fjeld  had left MCA infarct and subsequent craniectomy.  He was discharged from rehab several weeks ago. He has been home with his family  He is having occasional spasms on the right side still.Patient has Right hemiparesis, dysphagia, and aphasia secondary to large left MCA infarct with hemorrhagic transformation s/p hemicraniectomy.He had acute ischemic left MCA infarct and subsequent craniotomy - was first admitted on 03/01/18.  He was discharged from hospital on 04/06/18  How long  can you stand comfortably? stand for 20 mins  How long can you walk comfortably? 10 mins  Patient Stated Goals to walk without AD and live on his own.  Pain Assessment  Currently in Pain? No/denies  Pain Score 0      09/06/18 1109  Neuro Re-ed   Neuro Re-ed Details  for strengthening/NMR: fwd reciprocal stepping in bars floor<>airex<>balance board in ant/post direction<> single leg stance on 8 inch step with march on non stance leg- 5 reps  each foot leading with light touch on bars, min guard to min assist for balance.   Exercises  Exercises Other Exercises  Other Exercises  seated at edge of mat: left hamstring stretch for 30 sec's x 3 reps, then lying at edge of mat- right hip flexor stretch with leg off edge of mat for 1 minute hold x 2 reps.        09/06/18 1134  Balance Exercises: Standing  Rockerboard Anterior/posterior;Lateral;Head turns;EC;30 seconds;10 reps  Other Standing Exercises use of floor ladder with cane/AFO donned: 4 laps fwd working on decr base of support, incr step length and increased hip/knee flexion vs circumduction with gait. min guard to min assist for balance with cues to correct gait deviations.                        Balance Exercises: Standing  Rebounder Limitations performed both ways on balance board with no UE support: holding the board steady for EC no head movements, progressing to EC head movements left<>right and up<>down. min guard to min assist for balance. cues on posture and weight shifting for balance assistance.        PT Short Term Goals - 09/01/18 2300      PT SHORT TERM GOAL #1   Title  Sit to/from stand chairs without UE assist with supervision    Baseline  MET 09/01/2018    Status  Achieved      PT SHORT TERM GOAL #2   Title  Patient ambulates with straight cane scanning environment without LOB maintaining path with supervision.     Baseline  MET 09/01/2018    Status  Achieved      PT SHORT TERM GOAL #3   Title  Patient ambulates with straight cane & AFO >2.00 ft/sec    Baseline  MET 09/01/2018    Status  Achieved        PT Long Term Goals - 07/28/18 1303      PT LONG TERM GOAL #1   Title  Patient will increase six minute walk test distance to >1000 for progression to community ambulator and improve gait ability    Baseline  585 feet 05/10/18: 544f 10/31: 6422f   Time  12    Period  Weeks    Status  On-going    Target Date  09/30/18      PT LONG TERM GOAL #2    Title  Patient will increase 10 meter walk test to >3.28 ft/sec (1.26m75m as to improve gait speed for better community ambulation and to reduce fall risk.    Baseline  1.57 ft/sec (0.48 m/sec) 05/10/18: 2.33 ft/sec (0.55m2m10/31: 2.53 ft/sec (0.61m/23m12/19: 1.87 ft/sec cane quad tip & 1.65 ft/sec no AD minA    Time  12    Period  Weeks    Status  On-going    Target Date  09/30/18      PT LONG TERM GOAL #3  Title  Patient will increase Berg Balance score by >45/56 to demonstrate decreased fall risk during functional activities.    Baseline  25/56 05/10/18: 36/56 10/31: 39/56  12/19: 39/56    Time  12    Period  Weeks    Status  Revised    Target Date  09/30/18      PT LONG TERM GOAL #4   Title  Patient will be require no assist with ascend/descend 3 steps with single rail including on right side modified independent    Baseline  12/19: supervision with rail on left side only (which requires 2 rails to be on left side ascend & descend)    Time  12    Period  Weeks    Status  Revised    Target Date  09/30/18      PT LONG TERM GOAL #5   Title  Dynamic Gait Index with cane quad tip >12/24    Baseline  12/19: 4/24    Status  New    Target Date  09/30/18         09/06/18 1105  Plan  Clinical Impression Statement Today's skilled session continued to address gait with a straight cane, stretching for decreased tone and worked on balance reactions with no issues noted or reported. The pt is progressing toward goals and should benefit from continued PT to progress toward unmet goals.   Pt will benefit from skilled therapeutic intervention in order to improve on the following deficits Abnormal gait;Decreased balance;Decreased endurance;Decreased mobility;Difficulty walking;Impaired sensation;Increased muscle spasms;Impaired UE functional use;Impaired flexibility;Decreased strength;Decreased coordination;Decreased activity tolerance  Rehab Potential Good  PT Frequency 2x / week (16  visits)  PT Duration 8 weeks  PT Treatment/Interventions Manual techniques;Patient/family education;Neuromuscular re-education;Balance training;Functional mobility training;Therapeutic activities;Therapeutic exercise;Gait training;Orthotic Fit/Training;Passive range of motion;ADLs/Self Care Home Management;Electrical Stimulation;Stair training;Energy conservation;Taping;DME Instruction  PT Next Visit Plan work towards Industry,  continue to work SLS and RLE stregnthening/weightbearing. Gait training with straight cane normal tip.  PT Home Exercise Plan Access Code: [email or web address]   Consulted and Agree with Plan of Care Patient          Patient will benefit from skilled therapeutic intervention in order to improve the following deficits and impairments:  Abnormal gait, Decreased balance, Decreased endurance, Decreased mobility, Difficulty walking, Impaired sensation, Increased muscle spasms, Impaired UE functional use, Impaired flexibility, Decreased strength, Decreased coordination, Decreased activity tolerance  Visit Diagnosis: Hemiplegia and hemiparesis following cerebral infarction affecting right dominant side (HCC)  Muscle weakness (generalized)  Unsteadiness on feet     Problem List Patient Active Problem List   Diagnosis Date Noted  . CVA (cerebral vascular accident) ([place]) 05/02/2018  . Neurogenic bowel   . Neurogenic bladder   . Spastic hemiparesis ([place])   . 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 Cerebral infarction due to embolism of left middle cerebral artery (HCC) s/p thrombectomy 03/01/2018  . Carotid artery dissection ([place]) Left 03/01/2018  . Acute ischemic right MCA stroke ([place]) 03/01/2018  . Right hemiparesis ([place])   . Dysphagia, post-stroke   . Global aphasia   . Leukocytosis   . Folic acid deficiency 05/02/3006  . B12 deficiency 02/23/2018  . Hyperhomocysteinemia ([place]) 02/23/2018  . Smoker     Willow Ora, PTA, [facility] [address], [place], [place] [address] [phone] 09/07/18, 9:29 PM   Name: Durrel Mcnee MRN: [id number] Date of Birth: [birth year]

## 2018-09-08 ENCOUNTER — Ambulatory Visit: Payer: BLUE CROSS/BLUE SHIELD | Admitting: Occupational Therapy

## 2018-09-08 ENCOUNTER — Encounter: Payer: Self-pay | Admitting: Physical Therapy

## 2018-09-08 ENCOUNTER — Ambulatory Visit: Payer: BLUE CROSS/BLUE SHIELD | Admitting: Physical Therapy

## 2018-09-08 DIAGNOSIS — M6281 Muscle weakness (generalized): Secondary | ICD-10-CM | POA: Diagnosis not present

## 2018-09-08 DIAGNOSIS — R208 Other disturbances of skin sensation: Secondary | ICD-10-CM | POA: Diagnosis not present

## 2018-09-08 DIAGNOSIS — R482 Apraxia: Secondary | ICD-10-CM | POA: Diagnosis not present

## 2018-09-08 DIAGNOSIS — R2681 Unsteadiness on feet: Secondary | ICD-10-CM

## 2018-09-08 DIAGNOSIS — I69351 Hemiplegia and hemiparesis following cerebral infarction affecting right dominant side: Secondary | ICD-10-CM

## 2018-09-08 DIAGNOSIS — R2689 Other abnormalities of gait and mobility: Secondary | ICD-10-CM | POA: Diagnosis not present

## 2018-09-08 DIAGNOSIS — R278 Other lack of coordination: Secondary | ICD-10-CM | POA: Diagnosis not present
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
## 2018-09-09 NOTE — Therapy (Signed)
[facility] [address] [place], [place], [address] Phone: [phone]   Fax:  [phone]  Physical Therapy Treatment  Patient Details  Name: Colyn Miron MRN: [id number] Date of Birth: July 06, 1981 Referring Provider (PT): Reesa Chew   Encounter Date: 09/08/2018  PT End of Session - 09/08/18 1020    Visit Number  28    Number of Visits  34    Authorization Type  08/23/2018: Father reports on 09/09/17 he transitions to [facility] but coverage does not change only amount they pay for premium.  BCBS $3250 deductible & $6350oop max have been met. Pt is covered 100%    Authorization Time Period  VL: PT & OT 30 with insurance calendar year Aug 1-July 31 with 14 reported used. However visit count [facility] notes 18 for both PT & OT.  If PT & OT same day counts as one    Authorization - Visit Number  10    Authorization - Number of Visits  16    PT Start Time  6237    PT Stop Time  1058    PT Time Calculation (min)  40 min    Equipment Utilized During Treatment  Gait belt    Activity Tolerance  Patient tolerated treatment well    Behavior During Therapy  WFL for tasks assessed/performed       Past Medical History:  Diagnosis Date  . Aphasia   . GERD (gastroesophageal reflux disease)   . Hemiparesis ([place]) 02/2018   right side  . Hyperhomocysteinemia ([place])   . Neurogenic bladder 02/2018   04/26/18 inporving  . Spastic hemiparesis (HCC)    right side  . Stroke ([place])    Left MCA infart  . Tobacco abuse     Past Surgical History:  Procedure Laterality Date  . CRANIOTOMY Left 02/21/2018   Procedure: DECOMPRESSIVE CRANIECTOMY with bone flap to abdomen;  Surgeon: [name], MD;  Location: [facility];  Service: Neurosurgery;  Laterality: Left;  DECOMPRESSIVE CRANIECTOMY with bone flap to abdomen  . CRANIOTOMY N/A 05/02/2018   Procedure: RE-IMPLANTATION OF CRANIAL FLAP;  Surgeon: [name], MD;  Location: [facility];  Service:  Neurosurgery;  Laterality: N/A;  RE-IMPLANTATION OF CRANIAL FLAP  . IR ANGIO INTRA EXTRACRAN SEL COM CAROTID INNOMINATE UNI R MOD SED  02/19/2018  . IR ANGIO VERTEBRAL SEL VERTEBRAL BILAT MOD SED  02/19/2018  . IR CT HEAD LTD  02/19/2018  . IR PERCUTANEOUS ART THROMBECTOMY/INFUSION INTRACRANIAL INC DIAG ANGIO  02/19/2018  . IR US GUIDE VASC ACCESS RIGHT  02/19/2018  . RADIOLOGY WITH ANESTHESIA N/A 02/19/2018   Procedure: IR WITH ANESTHESIA CODE STROKE;  Surgeon: Corrie Mckusick, DO;  Location: [facility];  Service: Anesthesiology;  Laterality: N/A;  . TEE WITHOUT CARDIOVERSION N/A 03/01/2018   Procedure: TRANSESOPHAGEAL ECHOCARDIOGRAM (TEE);  Surgeon: Sanda Klein, MD;  [facility]: [facility] ENDOSCOPY;  Service: Cardiovascular;  Laterality: N/A;    There were no vitals filed for this visit.  Subjective Assessment - 09/08/18 1019    Subjective  No new complaints. No falls or pain to report.     Patient is accompained by:  Family member   dad in lobby   Pertinent History  Mr. Devan  had left MCA infarct and subsequent craniectomy.  He was discharged from rehab several weeks ago. He has been home with his family  He is having occasional spasms on the right side still.Patient has Right hemiparesis, dysphagia, and aphasia secondary to large left MCA  infarct with hemorrhagic transformation s/p hemicraniectomy.He had acute ischemic left MCA infarct and subsequent craniotomy - was first admitted on 03/01/18.  He was discharged from hospital on 04/06/18    How long can you stand comfortably?  stand for 20 mins    How long can you walk comfortably?  10 mins    Patient Stated Goals  to walk without AD and live on his own.    Currently in Pain?  No/denies    Pain Score  0-No pain              [facility] Adult PT Treatment/Exercise - 09/08/18 1020      Transfers   Transfers  Sit to Stand;Stand to Sit;Floor to Transfer    Sit to Stand  5: Supervision;From chair/3-in-1;With upper extremity assist;Without upper  extremity assist;4: Min guard;4: Min assist    Stand to Sit  5: Supervision;With upper extremity assist;To chair/3-in-1;4: Min guard;4: Min assist    Floor to Transfer  4: Min guard;4: Min assist    Number of Reps  10 reps    Comments  10 reps with left foot forward on green foam oval, right foot back to promote increased right LE weight bearing. intermittent use of left UE with min guard to min assist for balance/controlled movements.       Ambulation/Gait   Ambulation/Gait  Yes    Ambulation/Gait Assistance  5: Supervision;4: Min guard;4: Min assist    Ambulation/Gait Assistance Details  cues for narrowed base of support, step length and stance time on right LE.     Ambulation Distance (Feet)  80 Feet   x1, plus around gy   Assistive device  Straight cane;Other (Comment)    Gait Pattern  Step-through pattern;Decreased arm swing - right;Decreased stance time - right;Decreased stride length;Decreased weight shift to right;Right hip hike;Right steppage;Right foot flat;Right flexed knee in stance;Antalgic;Lateral hip instability;Trunk flexed;Abducted- right;Decreased step length - left    Ambulation Surface  Level;Indoor    Pre-Gait Activities  6 laps between lines of cones working on more narrowed base of support with emphasis on increased hip/knee flexion with swing phase vs circumduction.; moved cones closer together (~2 feet apart vs 3 feet) for 2 more laps. pt noted to stay close to left side despite cues to stay in middle. min guard to min assist for balance.       Neuro Re-ed    Neuro Re-ed Details   for strengthening/muscle re-ed: on red mat on floor next to blue mat table: with UE support on mat mini squats x 10 reps with emphasis on equal LE weight bearing and return to full upright posture, added blue band resistance for 10 more reps; with UE support- moving right LE back/fwd, then left LE back/fwd x 10 reps each side, cues/assist for posture and weight shifting. assist needed with right  foot clearance as well.                         Exercises   Exercises  Other Exercises    Other Exercises   seated at edge of mat: left hamstring stretch for 1 minute, then had pt lean forward more for increased stretch for another minute.       Knee/Hip Exercises: Standing   Forward Step Up  Right;2 sets;10 reps;Step Height: 6"    Forward Step Up Limitations  single UE support; lifting left foot up into air/back down with emphasis on "soft knee" on right side.  PT Short Term Goals - 09/01/18 2300      PT SHORT TERM GOAL #1   Title  Sit to/from stand chairs without UE assist with supervision    Baseline  MET 09/01/2018    Status  Achieved      PT SHORT TERM GOAL #2   Title  Patient ambulates with straight cane scanning environment without LOB maintaining path with supervision.     Baseline  MET 09/01/2018    Status  Achieved      PT SHORT TERM GOAL #3   Title  Patient ambulates with straight cane & AFO >2.00 ft/sec    Baseline  MET 09/01/2018    Status  Achieved        PT Long Term Goals - 07/28/18 1303      PT LONG TERM GOAL #1   Title  Patient will increase six minute walk test distance to >1000 for progression to community ambulator and improve gait ability    Baseline  585 feet 05/10/18: 539f 10/31: 6464f   Time  12    Period  Weeks    Status  On-going    Target Date  09/30/18      PT LONG TERM GOAL #2   Title  Patient will increase 10 meter walk test to >3.28 ft/sec (1.33m433m as to improve gait speed for better community ambulation and to reduce fall risk.    Baseline  1.57 ft/sec (0.48 m/sec) 05/10/18: 2.33 ft/sec (0.61m86m10/31: 2.53 ft/sec (0.38m/23m12/19: 1.87 ft/sec cane quad tip & 1.65 ft/sec no AD minA    Time  12    Period  Weeks    Status  On-going    Target Date  09/30/18      PT LONG TERM GOAL #3   Title  Patient will increase Berg Balance score by >45/56 to demonstrate decreased fall risk during functional activities.    Baseline   25/56 05/10/18: 36/56 10/31: 39/56  12/19: 39/56    Time  12    Period  Weeks    Status  Revised    Target Date  09/30/18      PT LONG TERM GOAL #4   Title  Patient will be require no assist with ascend/descend 3 steps with single rail including on right side modified independent    Baseline  12/19: supervision with rail on left side only (which requires 2 rails to be on left side ascend & descend)    Time  12    Period  Weeks    Status  Revised    Target Date  09/30/18      PT LONG TERM GOAL #5   Title  Dynamic Gait Index with cane quad tip >12/24    Baseline  12/19: 4/24    Status  New    Target Date  09/30/18            Plan - 09/08/18 1020    Clinical Impression Statement  Today's skilled session continued to address strengthening, coordination and gait with cane with no issues reported or noted with session. The pt is progressing toward goals and should benefit from continued PT to progress toward unmet goals.     Rehab Potential  Good    PT Frequency  2x / week   16 visits   PT Duration  8 weeks    PT Treatment/Interventions  Manual techniques;Patient/family education;Neuromuscular re-education;Balance training;Functional mobility training;Therapeutic activities;Therapeutic exercise;Gait training;Orthotic Fit/Training;Passive range of motion;ADLs/Self Care Home  Management;Electrical Stimulation;Stair training;Energy conservation;Taping;DME Instruction    PT Next Visit Plan  work towards [place],  continue to work SLS and RLE stregnthening/weightbearing. Gait training with straight cane normal tip.    PT Home Exercise Plan  Access Code: [email or web address]     Consulted and Agree with Plan of Care  Patient       Patient will benefit from skilled therapeutic intervention in order to improve the following deficits and impairments:  Abnormal gait, Decreased balance, Decreased endurance, Decreased mobility, Difficulty walking, Impaired sensation, Increased muscle spasms, Impaired UE  functional use, Impaired flexibility, Decreased strength, Decreased coordination, Decreased activity tolerance  Visit Diagnosis: Hemiplegia and hemiparesis following cerebral infarction affecting right dominant side (HCC)  Muscle weakness (generalized)  Unsteadiness on feet  Other abnormalities of gait and mobility     Problem List Patient Active Problem List   Diagnosis Date Noted  . CVA (cerebral vascular accident) ([place]) 05/02/2018  . Neurogenic bowel   . Neurogenic bladder   . Spastic hemiparesis ([place])   . 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 Cerebral infarction due to embolism of left middle cerebral artery (HCC) s/p thrombectomy 03/01/2018  . Carotid artery dissection ([place]) Left 03/01/2018  . Acute ischemic right MCA stroke ([place]) 03/01/2018  . Right hemiparesis ([place])   . Dysphagia, post-stroke   . Global aphasia   . Leukocytosis   . Folic acid deficiency 06/89/3406  . B12 deficiency 02/23/2018  . Hyperhomocysteinemia ([place]) 02/23/2018  . Smoker     Willow Ora, PTA, [facility] [address] [place], [place] [address] [phone] 09/09/18, 3:59 PM   Name: Keanen Dohse MRN: [id number] Date of Birth: January 28, 1981

## 2018-09-12 ENCOUNTER — Ambulatory Visit: Payer: BLUE CROSS/BLUE SHIELD | Admitting: Physical Medicine & Rehabilitation

## 2018-09-13 ENCOUNTER — Encounter: Payer: Self-pay | Admitting: Physical Therapy

## 2018-09-13 ENCOUNTER — Ambulatory Visit: Payer: BLUE CROSS/BLUE SHIELD | Attending: Family Medicine | Admitting: Occupational Therapy

## 2018-09-13 ENCOUNTER — Ambulatory Visit: Payer: BLUE CROSS/BLUE SHIELD | Admitting: Physical Therapy

## 2018-09-13 DIAGNOSIS — R482 Apraxia: Secondary | ICD-10-CM | POA: Insufficient documentation

## 2018-09-13 DIAGNOSIS — R2681 Unsteadiness on feet: Secondary | ICD-10-CM

## 2018-09-13 DIAGNOSIS — R2689 Other abnormalities of gait and mobility: Secondary | ICD-10-CM | POA: Diagnosis not present

## 2018-09-13 DIAGNOSIS — R208 Other disturbances of skin sensation: Secondary | ICD-10-CM | POA: Diagnosis not present

## 2018-09-13 DIAGNOSIS — I69351 Hemiplegia and hemiparesis following cerebral infarction affecting right dominant side: Secondary | ICD-10-CM | POA: Insufficient documentation

## 2018-09-13 DIAGNOSIS — R278 Other lack of coordination: Secondary | ICD-10-CM | POA: Insufficient documentation

## 2018-09-13 DIAGNOSIS — M6281 Muscle weakness (generalized): Secondary | ICD-10-CM | POA: Diagnosis not present

## 2018-09-13 NOTE — Therapy (Signed)
[facility] [address] [place], [place], [address] Phone: [phone]   Fax:  [phone]  Physical Therapy Treatment  Patient Details  Name: Xavier Villegas MRN: [id number] Date of Birth: [birth year] Referring Provider (PT): Reesa Chew   Encounter Date: 09/13/2018  PT End of Session - 09/13/18 1202    Visit Number  29    Number of Visits  34    Authorization Type  08/23/2018: Father reports on 09/09/17 he transitions to [facility] but coverage does not change only amount they pay for premium.  BCBS $3250 deductible & $6350oop max have been met. Pt is covered 100%    Authorization Time Period  VL: PT & OT 30 with insurance calendar year Aug 1-July 31 with 14 reported used. However visit count [facility] notes 18 for both PT & OT.  If PT & OT same day counts as one    Authorization - Visit Number  11    Authorization - Number of Visits  16    PT Start Time  6606    PT Stop Time  1100    PT Time Calculation (min)  45 min    Equipment Utilized During Treatment  Gait belt    Activity Tolerance  Patient tolerated treatment well    Behavior During Therapy  WFL for tasks assessed/performed       Past Medical History:  Diagnosis Date  . Aphasia   . GERD (gastroesophageal reflux disease)   . Hemiparesis ([place]) 02/2018   right side  . Hyperhomocysteinemia ([place])   . Neurogenic bladder 02/2018   04/26/18 inporving  . Spastic hemiparesis (HCC)    right side  . Stroke ([place])    Left MCA infart  . Tobacco abuse     Past Surgical History:  Procedure Laterality Date  . CRANIOTOMY Left 02/21/2018   Procedure: DECOMPRESSIVE CRANIECTOMY with bone flap to abdomen;  Surgeon: [name], MD;  Location: [facility];  Service: Neurosurgery;  Laterality: Left;  DECOMPRESSIVE CRANIECTOMY with bone flap to abdomen  . CRANIOTOMY N/A 05/02/2018   Procedure: RE-IMPLANTATION OF CRANIAL FLAP;  Surgeon: [name], MD;  Location: [facility];  Service:  Neurosurgery;  Laterality: N/A;  RE-IMPLANTATION OF CRANIAL FLAP  . IR ANGIO INTRA EXTRACRAN SEL COM CAROTID INNOMINATE UNI R MOD SED  02/19/2018  . IR ANGIO VERTEBRAL SEL VERTEBRAL BILAT MOD SED  02/19/2018  . IR CT HEAD LTD  02/19/2018  . IR PERCUTANEOUS ART THROMBECTOMY/INFUSION INTRACRANIAL INC DIAG ANGIO  02/19/2018  . IR US GUIDE VASC ACCESS RIGHT  02/19/2018  . RADIOLOGY WITH ANESTHESIA N/A 02/19/2018   Procedure: IR WITH ANESTHESIA CODE STROKE;  Surgeon: Corrie Mckusick, DO;  Location: [facility];  Service: Anesthesiology;  Laterality: N/A;  . TEE WITHOUT CARDIOVERSION N/A 03/01/2018   Procedure: TRANSESOPHAGEAL ECHOCARDIOGRAM (TEE);  Surgeon: Sanda Klein, MD;  [facility] ENDOSCOPY;  Service: Cardiovascular;  Laterality: N/A;    There were no vitals filed for this visit.  Subjective Assessment - 09/13/18 1020    Subjective  No falls.     Patient is accompained by:  Family member   dad in lobby   Pertinent History  Xavier Villegas  had left MCA infarct and subsequent craniectomy.  He was discharged from rehab several weeks ago. He has been home with his family  He is having occasional spasms on the right side still.Patient has Right hemiparesis, dysphagia, and aphasia secondary to large left MCA infarct with hemorrhagic transformation s/p hemicraniectomy.He had  acute ischemic left MCA infarct and subsequent craniotomy - was first admitted on 03/01/18.  He was discharged from hospital on 04/06/18    How long can you stand comfortably?  stand for 20 mins    How long can you walk comfortably?  10 mins    Patient Stated Goals  to walk without AD and live on his own.    Currently in Pain?  No/denies                       [facility] Adult PT Treatment/Exercise - 09/13/18 1100      Transfers   Transfers  Sit to Stand;Stand to Sit;Floor to Transfer    Sit to Stand  5: Supervision;From chair/3-in-1;Without upper extremity assist    Stand to Sit  5: Supervision;With upper extremity assist;To  chair/3-in-1    Floor to Transfer  --    Comments  --      Ambulation/Gait   Ambulation/Gait  Yes    Ambulation/Gait Assistance  5: Supervision;4: Min guard;4: Min assist    Ambulation/Gait Assistance Details  visual, tactile & verbal cues on proper step width / not abducting RLE.     Ambulation Distance (Feet)  300 Feet    Assistive device  Other (Comment);None   AFO only   Gait Pattern  Step-through pattern;Decreased arm swing - right;Decreased stance time - right;Decreased stride length;Decreased weight shift to right;Right hip hike;Right steppage;Right foot flat;Right flexed knee in stance;Antalgic;Lateral hip instability;Trunk flexed;Abducted- right;Decreased step length - left    Ambulation Surface  Indoor;Level      Neuro Re-ed    Neuro Re-ed Details   --      Exercises   Exercises  Other Exercises    Other Exercises   --      Knee/Hip Exercises: Machines for Strengthening   Total Gym Leg Press  BLEs with manual /tactile cues RLE to control rotation 100# 15 reps 2 sets;  RLE 60# 15 reps      Knee/Hip Exercises: Standing   Forward Step Up  --    Forward Step Up Limitations  --    Other Standing Knee Exercises  standing with LUE support, blue theraband around knee anteriorly: working on knee flexion & controlled extension without hyperextension. Pt positioned in bilateral stance, step forward & step back. 10 reps ea with tactile & verbal cues.     Other Standing Knee Exercises  standing with LUE support, blue theraband around knee posteriorly: working on knee extension & controlled eccentric flexion. Pt positioned in bilateral stance, step forward & step back. 10 reps ea with tactile & verbal cues.       Knee/Hip Exercises: Prone   Hamstring Curl  15 reps    Hamstring Curl Limitations  RLE active / manual assist    Hip Extension  AROM;AAROM;Right;15 reps    Hip Extension Limitations  RLE active / manual assist             PT Education - 09/13/18 1030    Education  Details  Night splint to stretch gastroc    Person(s) Educated  Patient    Methods  Explanation;Demonstration;Tactile cues;Verbal cues;Other (comment)   printout from internet of splint type   Comprehension  Verbalized understanding;Need further instruction       PT Short Term Goals - 09/01/18 2300      PT SHORT TERM GOAL #1   Title  Sit to/from stand chairs without UE assist with supervision  Baseline  MET 09/01/2018    Status  Achieved      PT SHORT TERM GOAL #2   Title  Patient ambulates with straight cane scanning environment without LOB maintaining path with supervision.     Baseline  MET 09/01/2018    Status  Achieved      PT SHORT TERM GOAL #3   Title  Patient ambulates with straight cane & AFO >2.00 ft/sec    Baseline  MET 09/01/2018    Status  Achieved        PT Long Term Goals - 07/28/18 1303      PT LONG TERM GOAL #1   Title  Patient will increase six minute walk test distance to >1000 for progression to community ambulator and improve gait ability    Baseline  585 feet 05/10/18: 529f 10/31: 644f   Time  12    Period  Weeks    Status  On-going    Target Date  09/30/18      PT LONG TERM GOAL #2   Title  Patient will increase 10 meter walk test to >3.28 ft/sec (1.35m79m as to improve gait speed for better community ambulation and to reduce fall risk.    Baseline  1.57 ft/sec (0.48 m/sec) 05/10/18: 2.33 ft/sec (0.75m45m10/31: 2.53 ft/sec (0.56m/32m12/19: 1.87 ft/sec cane quad tip & 1.65 ft/sec no AD minA    Time  12    Period  Weeks    Status  On-going    Target Date  09/30/18      PT LONG TERM GOAL #3   Title  Patient will increase Berg Balance score by >45/56 to demonstrate decreased fall risk during functional activities.    Baseline  25/56 05/10/18: 36/56 10/31: 39/56  12/19: 39/56    Time  12    Period  Weeks    Status  Revised    Target Date  09/30/18      PT LONG TERM GOAL #4   Title  Patient will be require no assist with ascend/descend 3 steps  with single rail including on right side modified independent    Baseline  12/19: supervision with rail on left side only (which requires 2 rails to be on left side ascend & descend)    Time  12    Period  Weeks    Status  Revised    Target Date  09/30/18      PT LONG TERM GOAL #5   Title  Dynamic Gait Index with cane quad tip >12/24    Baseline  12/19: 4/24    Status  New    Target Date  09/30/18            Plan - 09/13/18 1509    Clinical Impression Statement  PT educated patient on new night splint and patient appears to understand use. Neuromuscular reeducation activities to facilitate Right lower extremity muscle activity . PT also worked on improved gait with less abduction     Rehab Potential  Good    PT Frequency  2x / week   16 visits   PT Duration  8 weeks    PT Treatment/Interventions  Manual techniques;Patient/family education;Neuromuscular re-education;Balance training;Functional mobility training;Therapeutic activities;Therapeutic exercise;Gait training;Orthotic Fit/Training;Passive range of motion;ADLs/Self Care Home Management;Electrical Stimulation;Stair training;Energy conservation;Taping;DME Instruction    PT Next Visit Plan  work towards LTGs,[place] to work SLS and RLE stregnthening/weightbearing. Gait training with straight cane normal tip.    PT Home Exercise  Plan  Access Code: [email or web address]     Consulted and Agree with Plan of Care  Patient       Patient will benefit from skilled therapeutic intervention in order to improve the following deficits and impairments:  Abnormal gait, Decreased balance, Decreased endurance, Decreased mobility, Difficulty walking, Impaired sensation, Increased muscle spasms, Impaired UE functional use, Impaired flexibility, Decreased strength, Decreased coordination, Decreased activity tolerance  Visit Diagnosis: Hemiplegia and hemiparesis following cerebral infarction affecting right dominant side (HCC)  Unsteadiness on  feet  Other abnormalities of gait and mobility  Muscle weakness (generalized)     Problem List Patient Active Problem List   Diagnosis Date Noted  . CVA (cerebral vascular accident) ([place]) 05/02/2018  . Neurogenic bowel   . Neurogenic bladder   . Spastic hemiparesis ([place])   . 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 Cerebral infarction due to embolism of left middle cerebral artery (HCC) s/p thrombectomy 03/01/2018  . Carotid artery dissection ([place]) Left 03/01/2018  . Acute ischemic right MCA stroke ([place]) 03/01/2018  . Right hemiparesis ([place])   . Dysphagia, post-stroke   . Global aphasia   . Leukocytosis   . Folic acid deficiency 44/39/2659  . B12 deficiency 02/23/2018  . Hyperhomocysteinemia ([place]) 02/23/2018  . Smoker     Liston Thum  PT, DPT 09/13/2018, 3:19 PM  [facility] [address] [place], [place], [address] Phone: [phone]   Fax:  [phone]  Name: Kevin Space MRN: [id number] Date of Birth: 1980/08/11
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
## 2018-09-14 ENCOUNTER — Encounter: Payer: Self-pay | Admitting: Physical Medicine & Rehabilitation

## 2018-09-14 ENCOUNTER — Encounter
Payer: BLUE CROSS/BLUE SHIELD | Attending: Physical Medicine & Rehabilitation | Admitting: Physical Medicine & Rehabilitation

## 2018-09-14 DIAGNOSIS — I69391 Dysphagia following cerebral infarction: Secondary | ICD-10-CM | POA: Diagnosis not present

## 2018-09-14 DIAGNOSIS — Z87891 Personal history of nicotine dependence: Secondary | ICD-10-CM | POA: Diagnosis not present

## 2018-09-14 DIAGNOSIS — I69320 Aphasia following cerebral infarction: Secondary | ICD-10-CM | POA: Insufficient documentation

## 2018-09-14 DIAGNOSIS — Z9889 Other specified postprocedural states: Secondary | ICD-10-CM | POA: Diagnosis not present

## 2018-09-14 DIAGNOSIS — N319 Neuromuscular dysfunction of bladder, unspecified: Secondary | ICD-10-CM | POA: Insufficient documentation

## 2018-09-14 DIAGNOSIS — I69351 Hemiplegia and hemiparesis following cerebral infarction affecting right dominant side: Secondary | ICD-10-CM | POA: Insufficient documentation

## 2018-09-14 DIAGNOSIS — G8110 Spastic hemiplegia affecting unspecified side: Secondary | ICD-10-CM

## 2018-09-14 DIAGNOSIS — K592 Neurogenic bowel, not elsewhere classified: Secondary | ICD-10-CM | POA: Diagnosis not present

## 2018-09-14 NOTE — Progress Notes (Signed)
Phenol neurolysis of the right tibial nerve Dx spastic HP: right G81.10   Indication: Severe spasticity in the plantar flexor muscles which is not responding to medical management and other conservative care and interfering with functional use.  Informed consent was obtained after describing the risks and benefits of the procedure with the patient this includes bleeding bruising and infection as well as medication side effects. The patient elected to proceed and has given written consent. Patient placed in a prone position on the exam table. External DC stimulation was applied to the popliteal space using a nerve stimulator. Plantar flexion twitch was obtained. The popliteal region was prepped with Betadine and then entered with a 22-gauge 40 mm needle electrode under electrical stimulation guidance. Plantar flexion which was obtained and confirmed. Then 4 cc of 5% phenol were injected. The patient tolerated procedure well. Post procedure instructions and followup visit were given.  Follow up next month for botox RUE

## 2018-09-14 NOTE — Patient Instructions (Signed)
PLEASE FEEL FREE TO CALL OUR OFFICE WITH ANY PROBLEMS OR QUESTIONS ([phone])      

## 2018-09-15 ENCOUNTER — Ambulatory Visit: Payer: BLUE CROSS/BLUE SHIELD | Admitting: Physical Therapy

## 2018-09-15 ENCOUNTER — Encounter: Payer: Self-pay | Admitting: Physical Therapy

## 2018-09-15 ENCOUNTER — Ambulatory Visit: Payer: BLUE CROSS/BLUE SHIELD | Admitting: Occupational Therapy

## 2018-09-15 DIAGNOSIS — R208 Other disturbances of skin sensation: Secondary | ICD-10-CM | POA: Diagnosis not present

## 2018-09-15 DIAGNOSIS — M6281 Muscle weakness (generalized): Secondary | ICD-10-CM | POA: Diagnosis not present

## 2018-09-15 DIAGNOSIS — I69351 Hemiplegia and hemiparesis following cerebral infarction affecting right dominant side: Secondary | ICD-10-CM

## 2018-09-15 DIAGNOSIS — R2689 Other abnormalities of gait and mobility: Secondary | ICD-10-CM

## 2018-09-15 DIAGNOSIS — R2681 Unsteadiness on feet: Secondary | ICD-10-CM

## 2018-09-15 DIAGNOSIS — R482 Apraxia: Secondary | ICD-10-CM | POA: Diagnosis not present

## 2018-09-15 DIAGNOSIS — R278 Other lack of coordination: Secondary | ICD-10-CM | POA: Diagnosis not present
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
## 2018-09-16 NOTE — Therapy (Signed)
[facility] [address] [place], [place], [address] Phone: [phone]   Fax:  [phone]  Physical Therapy Treatment  Patient Details  Name: Xavier Villegas MRN: [id number] Date of Birth: Dec 29, 1980 Referring Provider (PT): Reesa Chew   Encounter Date: 09/15/2018  PT End of Session - 09/15/18 1023    Visit Number  30    Number of Visits  34    Authorization Type  08/23/2018: Father reports on 09/09/17 he transitions to [facility] but coverage does not change only amount they pay for premium.  BCBS $3250 deductible & $6350oop max have been met. Pt is covered 100%    Authorization Time Period  VL: PT & OT 30 with insurance calendar year Aug 1-July 31 with 14 reported used. However visit count [facility] notes 18 for both PT & OT.  If PT & OT same day counts as one    Authorization - Visit Number  12    Authorization - Number of Visits  16    PT Start Time  1019    PT Stop Time  1059    PT Time Calculation (min)  40 min    Equipment Utilized During Treatment  Gait belt    Activity Tolerance  Patient tolerated treatment well    Behavior During Therapy  WFL for tasks assessed/performed       Past Medical History:  Diagnosis Date  . Aphasia   . GERD (gastroesophageal reflux disease)   . Hemiparesis ([place]) 02/2018   right side  . Hyperhomocysteinemia ([place])   . Neurogenic bladder 02/2018   04/26/18 inporving  . Spastic hemiparesis (HCC)    right side  . Stroke ([place])    Left MCA infart  . Tobacco abuse     Past Surgical History:  Procedure Laterality Date  . CRANIOTOMY Left 02/21/2018   Procedure: DECOMPRESSIVE CRANIECTOMY with bone flap to abdomen;  Surgeon: [name], MD;  Location: [facility] [place];  Service: Neurosurgery;  Laterality: Left;  DECOMPRESSIVE CRANIECTOMY with bone flap to abdomen  . CRANIOTOMY N/A 05/02/2018   Procedure: RE-IMPLANTATION OF CRANIAL FLAP;  Surgeon: [name], MD;  Location: [facility];  Service:  Neurosurgery;  Laterality: N/A;  RE-IMPLANTATION OF CRANIAL FLAP  . IR ANGIO INTRA EXTRACRAN SEL COM CAROTID INNOMINATE UNI R MOD SED  02/19/2018  . IR ANGIO VERTEBRAL SEL VERTEBRAL BILAT MOD SED  02/19/2018  . IR CT HEAD LTD  02/19/2018  . IR PERCUTANEOUS ART THROMBECTOMY/INFUSION INTRACRANIAL INC DIAG ANGIO  02/19/2018  . IR US GUIDE VASC ACCESS RIGHT  02/19/2018  . RADIOLOGY WITH ANESTHESIA N/A 02/19/2018   Procedure: IR WITH ANESTHESIA CODE STROKE;  Surgeon: Corrie Mckusick, DO;  Location: [facility];  Service: Anesthesiology;  Laterality: N/A;  . TEE WITHOUT CARDIOVERSION N/A 03/01/2018   Procedure: TRANSESOPHAGEAL ECHOCARDIOGRAM (TEE);  Surgeon: Sanda Klein, MD;  [facility]: [facility] ENDOSCOPY;  Service: Cardiovascular;  Laterality: N/A;    There were no vitals filed for this visit.  Subjective Assessment - 09/15/18 1022    Subjective  No new complaints. No falls. Had phenol injection by Dr. Naaman Plummer yesterday,     Pertinent History  Xavier Villegas  had left MCA infarct and subsequent craniectomy.  He was discharged from rehab several weeks ago. He has been home with his family  He is having occasional spasms on the right side still.Patient has Right hemiparesis, dysphagia, and aphasia secondary to large left MCA infarct with hemorrhagic transformation s/p hemicraniectomy.He had acute ischemic left MCA  infarct and subsequent craniotomy - was first admitted on 03/01/18.  He was discharged from hospital on 04/06/18    How long can you stand comfortably?  stand for 20 mins    How long can you walk comfortably?  10 mins    Patient Stated Goals  to walk without AD and live on his own.    Currently in Pain?  No/denies    Pain Score  0-No pain          [facility] Adult PT Treatment/Exercise - 09/15/18 1024      Transfers   Transfers  Sit to Stand;Stand to Sit    Sit to Stand  5: Supervision;From chair/3-in-1;Without upper extremity assist    Stand to Sit  5: Supervision;With upper extremity assist;To chair/3-in-1       Ambulation/Gait   Ambulation/Gait  Yes    Ambulation/Gait Assistance  5: Supervision;4: Min guard    Ambulation/Gait Assistance Details  cues for more narrowed base of support, increased hip/knee flexion with decr circumdution.     Ambulation Distance (Feet)  --   around gym with activites   Assistive device  None    Gait Pattern  Step-through pattern;Decreased arm swing - right;Decreased stance time - right;Decreased stride length;Decreased weight shift to right;Right hip hike;Right steppage;Right foot flat;Right flexed knee in stance;Antalgic;Lateral hip instability;Trunk flexed;Abducted- right;Decreased step length - left    Ambulation Surface  Level;Indoor      Neuro Re-ed    Neuro Re-ed Details   for strengthening/NMR: standing at bottom of steps with right stance, emphasis on "soft knee"- left foot taps up/down bottom 3 steps for 10 reps with single UE support.  manual assist to right knee to prevent hyperextension in stance.       Exercises   Exercises  Other Exercises    Other Exercises   passive heel cord and hamstring stretching to left LE for 30 sec holds, 3 reps each.       Knee/Hip Exercises: Machines for Strengthening   Total Gym Leg Press  bil LE;s 100#'s with 5 sec hold x 10 reps-asisst for right LE knee control, then right LE only 60#'s for 10 reps with 5 sec holds, assist for knee control. cues for slow, controlled movements with all reps.       Knee/Hip Exercises: Standing   Terminal Knee Extension  AROM;AAROM;Strengthening;Right;2 sets;10 reps;Theraband;Limitations    Theraband Level (Terminal Knee Extension)  Level 4 (Blue)    Terminal Knee Extension Limitations  with left UE support on sturdy surface, right foot staggered forward- cues/facilitation to prevent pelvic movements with each rep, 5 sec hold each rep.     Forward Step Up  Right;1 set;10 reps;Hand Hold: 1;Step Height: 6";Limitations    Forward Step Up Limitations  single UE support; lifting left foot up  into air/back down with emphasis on "soft knee" on right side.    Other Standing Knee Exercises  attempted stool scoots with right LE only using office chair, too many compensatory movements occuring so switched to planted right foot with PTA stabilizing it to floor- then had pt pull fwd/push bwd for 10 reps, less compensatory movements noted. cues/facilitation for right LE use only.            PT Short Term Goals - 09/01/18 2300      PT SHORT TERM GOAL #1   Title  Sit to/from stand chairs without UE assist with supervision    Baseline  MET 09/01/2018    Status  Achieved      PT SHORT TERM GOAL #2   Title  Patient ambulates with straight cane scanning environment without LOB maintaining path with supervision.     Baseline  MET 09/01/2018    Status  Achieved      PT SHORT TERM GOAL #3   Title  Patient ambulates with straight cane & AFO >2.00 ft/sec    Baseline  MET 09/01/2018    Status  Achieved        PT Long Term Goals - 07/28/18 1303      PT LONG TERM GOAL #1   Title  Patient will increase six minute walk test distance to >1000 for progression to community ambulator and improve gait ability    Baseline  585 feet 05/10/18: 537f 10/31: 6423f   Time  12    Period  Weeks    Status  On-going    Target Date  09/30/18      PT LONG TERM GOAL #2   Title  Patient will increase 10 meter walk test to >3.28 ft/sec (1.78m12m as to improve gait speed for better community ambulation and to reduce fall risk.    Baseline  1.57 ft/sec (0.48 m/sec) 05/10/18: 2.33 ft/sec (0.66m48m10/31: 2.53 ft/sec (0.53m/45m12/19: 1.87 ft/sec cane quad tip & 1.65 ft/sec no AD minA    Time  12    Period  Weeks    Status  On-going    Target Date  09/30/18      PT LONG TERM GOAL #3   Title  Patient will increase Berg Balance score by >45/56 to demonstrate decreased fall risk during functional activities.    Baseline  25/56 05/10/18: 36/56 10/31: 39/56  12/19: 39/56    Time  12    Period  Weeks    Status   Revised    Target Date  09/30/18      PT LONG TERM GOAL #4   Title  Patient will be require no assist with ascend/descend 3 steps with single rail including on right side modified independent    Baseline  12/19: supervision with rail on left side only (which requires 2 rails to be on left side ascend & descend)    Time  12    Period  Weeks    Status  Revised    Target Date  09/30/18      PT LONG TERM GOAL #5   Title  Dynamic Gait Index with cane quad tip >12/24    Baseline  12/19: 4/24    Status  New    Target Date  09/30/18            Plan - 09/15/18 1023    Clinical Impression Statement  Today's skilled session continued to address left LE stretching and strengthening with out any issues reported/noted in session. The pt is making steady progress toward goals and should benefit from continued PT to progress toward unmet goals.     Rehab Potential  Good    PT Frequency  2x / week   16 visits   PT Duration  8 weeks    PT Treatment/Interventions  Manual techniques;Patient/family education;Neuromuscular re-education;Balance training;Functional mobility training;Therapeutic activities;Therapeutic exercise;Gait training;Orthotic Fit/Training;Passive range of motion;ADLs/Self Care Home Management;Electrical Stimulation;Stair training;Energy conservation;Taping;DME Instruction    PT Next Visit Plan  work towards LTGs,[place] to work SLS and RLE stregnthening/weightbearing. Gait training with straight cane normal tip.    PT Home Exercise Plan  Access Code: [email or web address]  Consulted and Agree with Plan of Care  Patient       Patient will benefit from skilled therapeutic intervention in order to improve the following deficits and impairments:  Abnormal gait, Decreased balance, Decreased endurance, Decreased mobility, Difficulty walking, Impaired sensation, Increased muscle spasms, Impaired UE functional use, Impaired flexibility, Decreased strength, Decreased coordination, Decreased  activity tolerance  Visit Diagnosis: Hemiplegia and hemiparesis following cerebral infarction affecting right dominant side (HCC)  Unsteadiness on feet  Other abnormalities of gait and mobility  Muscle weakness (generalized)  Other disturbances of skin sensation     Problem List Patient Active Problem List   Diagnosis Date Noted  . CVA (cerebral vascular accident) ([place]) 05/02/2018  . Neurogenic bowel   . Neurogenic bladder   . Spastic hemiparesis ([place])   . 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 Cerebral infarction due to embolism of left middle cerebral artery (HCC) s/p thrombectomy 03/01/2018  . Carotid artery dissection ([place]) Left 03/01/2018  . Acute ischemic right MCA stroke ([place]) 03/01/2018  . Right hemiparesis ([place])   . Dysphagia, post-stroke   . Global aphasia   . Leukocytosis   . Folic acid deficiency 48/62/8241  . B12 deficiency 02/23/2018  . Hyperhomocysteinemia ([place]) 02/23/2018  . Smoker     Willow Ora, PTA, [facility] [address] [place], [place] [address] [phone] 09/16/18, 12:59 PM   Name: Xavier Villegas MRN: [id number] Date of Birth: 02-Mar-1981

## 2018-09-20 ENCOUNTER — Encounter: Payer: Self-pay | Admitting: Physical Therapy

## 2018-09-20 ENCOUNTER — Ambulatory Visit: Payer: BLUE CROSS/BLUE SHIELD | Admitting: Physical Therapy

## 2018-09-20 ENCOUNTER — Ambulatory Visit: Payer: BLUE CROSS/BLUE SHIELD | Admitting: Occupational Therapy

## 2018-09-20 DIAGNOSIS — M6281 Muscle weakness (generalized): Secondary | ICD-10-CM | POA: Diagnosis not present

## 2018-09-20 DIAGNOSIS — I69351 Hemiplegia and hemiparesis following cerebral infarction affecting right dominant side: Secondary | ICD-10-CM

## 2018-09-20 DIAGNOSIS — R2681 Unsteadiness on feet: Secondary | ICD-10-CM

## 2018-09-20 DIAGNOSIS — R278 Other lack of coordination: Secondary | ICD-10-CM | POA: Diagnosis not present

## 2018-09-20 DIAGNOSIS — R208 Other disturbances of skin sensation: Secondary | ICD-10-CM | POA: Diagnosis not present

## 2018-09-20 DIAGNOSIS — R482 Apraxia: Secondary | ICD-10-CM | POA: Diagnosis not present

## 2018-09-20 DIAGNOSIS — R2689 Other abnormalities of gait and mobility: Secondary | ICD-10-CM

## 2018-09-20 NOTE — Therapy (Signed)
[facility] [address] [place], [place], [address] Phone: [phone]   Fax:  [phone]  Physical Therapy Treatment  Patient Details  Name: Xavier Villegas MRN: [id number] Date of Birth: 10-03-1980 Referring Provider (PT): Reesa Chew   Encounter Date: 09/20/2018  PT End of Session - 09/20/18 1104    Visit Number  31    Number of Visits  34    Authorization Type  08/23/2018: Father reports on 09/09/17 he transitions to [facility] but coverage does not change only amount they pay for premium.  BCBS $3250 deductible & $6350oop max have been met. Pt is covered 100%    Authorization Time Period  VL: PT & OT 30 with insurance calendar year Aug 1-July 31 with 14 reported used. However visit count [facility] notes 18 for both PT & OT.  If PT & OT same day counts as one    Authorization - Visit Number  13    Authorization - Number of Visits  16    PT Start Time  1102    PT Stop Time  1145    PT Time Calculation (min)  43 min    Equipment Utilized During Treatment  Gait belt    Activity Tolerance  Patient tolerated treatment well    Behavior During Therapy  WFL for tasks assessed/performed       Past Medical History:  Diagnosis Date  . Aphasia   . GERD (gastroesophageal reflux disease)   . Hemiparesis ([place]) 02/2018   right side  . Hyperhomocysteinemia ([place])   . Neurogenic bladder 02/2018   04/26/18 inporving  . Spastic hemiparesis (H[facility])    right side  . Stroke ([place])    Left MCA infart  . Tobacco abuse     Past Surgical History:  Procedure Laterality Date  . CRANIOTOMY Left 02/21/2018   Procedure: DECOMPRESSIVE CRANIECTOMY with bone flap to abdomen;  Surgeon: [name], MD;  Location: [facility];  Service: Neurosurgery;  Laterality: Left;  DECOMPRESSIVE CRANIECTOMY with bone flap to abdomen  . CRANIOTOMY N/A 05/02/2018   Procedure: RE-IMPLANTATION OF CRANIAL FLAP;  Surgeon: [name], MD;  Location: [facility];  Service:  Neurosurgery;  Laterality: N/A;  RE-IMPLANTATION OF CRANIAL FLAP  . IR ANGIO INTRA EXTRACRAN SEL COM CAROTID INNOMINATE UNI R MOD SED  02/19/2018  . IR ANGIO VERTEBRAL SEL VERTEBRAL BILAT MOD SED  02/19/2018  . IR CT HEAD LTD  02/19/2018  . IR PERCUTANEOUS ART THROMBECTOMY/INFUSION INTRACRANIAL INC DIAG ANGIO  02/19/2018  . IR US GUIDE VASC ACCESS RIGHT  02/19/2018  . RADIOLOGY WITH ANESTHESIA N/A 02/19/2018   Procedure: IR WITH ANESTHESIA CODE STROKE;  Surgeon: Corrie Mckusick, DO;  Location: [facility];  Service: Anesthesiology;  Laterality: N/A;  . TEE WITHOUT CARDIOVERSION N/A 03/01/2018   Procedure: TRANSESOPHAGEAL ECHOCARDIOGRAM (TEE);  Surgeon: Sanda Klein, MD;  Location: [facility] ENDOSCOPY;  Service: Cardiovascular;  Laterality: N/A;    There were no vitals filed for this visit.  Subjective Assessment - 09/20/18 1103    Subjective  No new complaints. No falls or pain to report. Has noticed some decrease in tightness since getting the injection.    Pertinent History  Xavier Villegas  had left MCA infarct and subsequent craniectomy.  He was discharged from rehab several weeks ago. He has been home with his family  He is having occasional spasms on the right side still.Patient has Right hemiparesis, dysphagia, and aphasia secondary to large left MCA infarct with hemorrhagic transformation s/p  hemicraniectomy.He had acute ischemic left MCA infarct and subsequent craniotomy - was first admitted on 03/01/18.  He was discharged from hospital on 04/06/18    How long can you stand comfortably?  stand for 20 mins    How long can you walk comfortably?  10 mins    Patient Stated Goals  to walk without AD and live on his own.    Currently in Pain?  No/denies    Pain Score  0-No pain          [facility] Adult PT Treatment/Exercise - 09/20/18 1114      Transfers   Transfers  Sit to Stand;Stand to Sit    Sit to Stand  5: Supervision    Stand to Sit  5: Supervision      Ambulation/Gait   Ambulation/Gait  Yes     Ambulation/Gait Assistance  5: Supervision;4: Min guard    Ambulation/Gait Assistance Details  cues for more narrowed base of support. notable increase in right knee recurvatum when fatigued.     Ambulation Distance (Feet)  --   around gym with activities   Assistive device  None    Gait Pattern  Step-through pattern;Decreased arm swing - right;Decreased stance time - right;Decreased stride length;Decreased weight shift to right;Right hip hike;Right steppage;Right foot flat;Right flexed knee in stance;Antalgic;Lateral hip instability;Trunk flexed;Abducted- right;Decreased step length - left    Ambulation Surface  Level;Indoor      High Level Balance   High Level Balance Activities  Negotiating over obstacles    High Level Balance Comments  4 small hurdles on blue mat next to counter: side stepping over with cues for right hip/knee flexion x 3 laps each way with single UE support, min guard to min assist for balance; fwd reciprocal stepping over, then backward step to pattern over hurdles x 2 laps each way with single UE support on counter, cues for hip.knee flexion and use of mirror for visual assist with stepping backwards.       Knee/Hip Exercises: Machines for Strengthening   Total Gym Leg Press  bil LE's 100#s with 5 sec holds for 15 reps, then right LE only 50#s 5 sec holds x 15 reps. manual assist at right knee for control/to maintain alignment.       Knee/Hip Exercises: Standing   Forward Step Up  Both;1 set;10 reps;Hand Hold: 1;Step Height: 8";Limitations    Forward Step Up Limitations  with single UE support on bar: fwd step up with contralateral heel tap to chair seat for 10 reps each side. min guard assist with cues on technique.                PT Short Term Goals - 09/01/18 2300      PT SHORT TERM GOAL #1   Title  Sit to/from stand chairs without UE assist with supervision    Baseline  MET 09/01/2018    Status  Achieved      PT SHORT TERM GOAL #2   Title  Patient  ambulates with straight cane scanning environment without LOB maintaining path with supervision.     Baseline  MET 09/01/2018    Status  Achieved      PT SHORT TERM GOAL #3   Title  Patient ambulates with straight cane & AFO >2.00 ft/sec    Baseline  MET 09/01/2018    Status  Achieved        PT Long Term Goals - 07/28/18 1303      PT  LONG TERM GOAL #1   Title  Patient will increase six minute walk test distance to >1000 for progression to community ambulator and improve gait ability    Baseline  585 feet 05/10/18: 575f 10/31: 6466f   Time  12    Period  Weeks    Status  On-going    Target Date  09/30/18      PT LONG TERM GOAL #2   Title  Patient will increase 10 meter walk test to >3.28 ft/sec (1.28m57m as to improve gait speed for better community ambulation and to reduce fall risk.    Baseline  1.57 ft/sec (0.48 m/sec) 05/10/18: 2.33 ft/sec (0.16m76m10/31: 2.53 ft/sec (0.50m/63m12/19: 1.87 ft/sec cane quad tip & 1.65 ft/sec no AD minA    Time  12    Period  Weeks    Status  On-going    Target Date  09/30/18      PT LONG TERM GOAL #3   Title  Patient will increase Berg Balance score by >45/56 to demonstrate decreased fall risk during functional activities.    Baseline  25/56 05/10/18: 36/56 10/31: 39/56  12/19: 39/56    Time  12    Period  Weeks    Status  Revised    Target Date  09/30/18      PT LONG TERM GOAL #4   Title  Patient will be require no assist with ascend/descend 3 steps with single rail including on right side modified independent    Baseline  12/19: supervision with rail on left side only (which requires 2 rails to be on left side ascend & descend)    Time  12    Period  Weeks    Status  Revised    Target Date  09/30/18      PT LONG TERM GOAL #5   Title  Dynamic Gait Index with cane quad tip >12/24    Baseline  12/19: 4/24    Status  New    Target Date  09/30/18            Plan - 09/20/18 1104    Clinical Impression Statement  Today's skilled  session continued to focus on LE strengthening, coordination and balance reactions with no issues other than fatigue reported. The pt is progressing toward goals and should benefit from continued PT to progress toward unmet goals.     Rehab Potential  Good    PT Frequency  2x / week   16 visits   PT Duration  8 weeks    PT Treatment/Interventions  Manual techniques;Patient/family education;Neuromuscular re-education;Balance training;Functional mobility training;Therapeutic activities;Therapeutic exercise;Gait training;Orthotic Fit/Training;Passive range of motion;ADLs/Self Care Home Management;Electrical Stimulation;Stair training;Energy conservation;Taping;DME Instruction    PT Next Visit Plan  work towards LTGs,[place] to work SLS and RLE stregnthening/weightbearing. Gait training with straight cane normal tip.    PT Home Exercise Plan  Access Code: [email or web address]     Consulted and Agree with Plan of Care  Patient       Patient will benefit from skilled therapeutic intervention in order to improve the following deficits and impairments:  Abnormal gait, Decreased balance, Decreased endurance, Decreased mobility, Difficulty walking, Impaired sensation, Increased muscle spasms, Impaired UE functional use, Impaired flexibility, Decreased strength, Decreased coordination, Decreased activity tolerance  Visit Diagnosis: Unsteadiness on feet  Hemiplegia and hemiparesis following cerebral infarction affecting right dominant side (HCC)  Other abnormalities of gait and mobility  Muscle weakness (generalized)     Problem  List Patient Active Problem List   Diagnosis Date Noted  . CVA (cerebral vascular accident) ([place]) 05/02/2018  . Neurogenic bowel   . Neurogenic bladder   . Spastic hemiparesis ([place])   . 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 Cerebral infarction due to embolism of left middle cerebral artery (HCC) s/p thrombectomy  03/01/2018  . Carotid artery dissection ([place]) Left 03/01/2018  . Acute ischemic right MCA stroke ([place]) 03/01/2018  . Right hemiparesis ([place])   . Dysphagia, post-stroke   . Global aphasia   . Leukocytosis   . Folic acid deficiency 40/99/2780  . B12 deficiency 02/23/2018  . Hyperhomocysteinemia ([place]) 02/23/2018  . Smoker    Willow Ora, PTA, [facility] [address] [place], [place] [address] [phone] 09/20/18, 4:08 PM   Name: Xavier Villegas MRN: [id number] Date of Birth: [birth year]
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
## 2018-09-22 ENCOUNTER — Ambulatory Visit: Payer: BLUE CROSS/BLUE SHIELD | Admitting: Physical Therapy

## 2018-09-22 ENCOUNTER — Ambulatory Visit: Payer: BLUE CROSS/BLUE SHIELD | Admitting: Occupational Therapy

## 2018-09-22 ENCOUNTER — Encounter: Payer: Self-pay | Admitting: Physical Therapy

## 2018-09-22 ENCOUNTER — Encounter: Payer: Self-pay | Admitting: Occupational Therapy

## 2018-09-22 DIAGNOSIS — R278 Other lack of coordination: Secondary | ICD-10-CM | POA: Diagnosis not present

## 2018-09-22 DIAGNOSIS — R208 Other disturbances of skin sensation: Secondary | ICD-10-CM | POA: Diagnosis not present

## 2018-09-22 DIAGNOSIS — R2681 Unsteadiness on feet: Secondary | ICD-10-CM

## 2018-09-22 DIAGNOSIS — R482 Apraxia: Secondary | ICD-10-CM | POA: Diagnosis not present

## 2018-09-22 DIAGNOSIS — R2689 Other abnormalities of gait and mobility: Secondary | ICD-10-CM | POA: Diagnosis not present

## 2018-09-22 DIAGNOSIS — M6281 Muscle weakness (generalized): Secondary | ICD-10-CM | POA: Diagnosis not present

## 2018-09-22 DIAGNOSIS — I69351 Hemiplegia and hemiparesis following cerebral infarction affecting right dominant side: Secondary | ICD-10-CM | POA: Diagnosis not present
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
## 2018-09-22 NOTE — Therapy (Signed)
[facility] [address] [place], [place], [address] Phone: [phone]   Fax:  [phone]  Physical Therapy Treatment  Patient Details  Name: Xavier Villegas MRN: [id number] Date of Birth: 03/25/1981 Referring Provider (PT): Reesa Chew   Encounter Date: 09/22/2018  PT End of Session - 09/22/18 1103    Visit Number  32    Number of Visits  34    Authorization Type  08/23/2018: Father reports on 09/09/17 he transitions to [facility] but coverage does not change only amount they pay for premium.  [employer] $3250 deductible & $6350oop max have been met. Pt is covered 100%    Authorization Time Period  VL: PT & OT 30 with insurance calendar year Aug 1-July 31 with 14 reported used. However visit count [facility] notes 18 for both PT & OT.  If PT & OT same day counts as one    Authorization - Visit Number  14    Authorization - Number of Visits  16    PT Start Time  1102    PT Stop Time  1143    PT Time Calculation (min)  41 min    Equipment Utilized During Treatment  Gait belt    Activity Tolerance  Patient tolerated treatment well    Behavior During Therapy  WFL for tasks assessed/performed       Past Medical History:  Diagnosis Date  . Aphasia   . GERD (gastroesophageal reflux disease)   . Hemiparesis ([place]) 02/2018   right side  . Hyperhomocysteinemia ([place])   . Neurogenic bladder 02/2018   04/26/18 inporving  . Spastic hemiparesis (HCC)    right side  . Stroke ([place])    Left MCA infart  . Tobacco abuse     Past Surgical History:  Procedure Laterality Date  . CRANIOTOMY Left 02/21/2018   Procedure: DECOMPRESSIVE CRANIECTOMY with bone flap to abdomen;  Surgeon: [name], MD;  Location: [facility];  Service: Neurosurgery;  Laterality: Left;  DECOMPRESSIVE CRANIECTOMY with bone flap to abdomen  . CRANIOTOMY N/A 05/02/2018   Procedure: RE-IMPLANTATION OF CRANIAL FLAP;  Surgeon: [name], MD;  Location: [facility];  Service:  Neurosurgery;  Laterality: N/A;  RE-IMPLANTATION OF CRANIAL FLAP  . IR ANGIO INTRA EXTRACRAN SEL COM CAROTID INNOMINATE UNI R MOD SED  02/19/2018  . IR ANGIO VERTEBRAL SEL VERTEBRAL BILAT MOD SED  02/19/2018  . IR CT HEAD LTD  02/19/2018  . IR PERCUTANEOUS ART THROMBECTOMY/INFUSION INTRACRANIAL INC DIAG ANGIO  02/19/2018  . IR US GUIDE VASC ACCESS RIGHT  02/19/2018  . RADIOLOGY WITH ANESTHESIA N/A 02/19/2018   Procedure: IR WITH ANESTHESIA CODE STROKE;  Surgeon: Corrie Mckusick, DO;  Location: [facility];  Service: Anesthesiology;  Laterality: N/A;  . TEE WITHOUT CARDIOVERSION N/A 03/01/2018   Procedure: TRANSESOPHAGEAL ECHOCARDIOGRAM (TEE);  Surgeon: Sanda Klein, MD;  [facility] ENDOSCOPY;  Service: Cardiovascular;  Laterality: N/A;    There were no vitals filed for this visit.  Subjective Assessment - 09/22/18 1102    Subjective  No new complaints. No falls or pain to report.     Patient is accompained by:  Family member    Pertinent History  Xavier Villegas  had left MCA infarct and subsequent craniectomy.  He was discharged from rehab several weeks ago. He has been home with his family  He is having occasional spasms on the right side still.Patient has Right hemiparesis, dysphagia, and aphasia secondary to large left MCA infarct with hemorrhagic transformation  s/p hemicraniectomy.He had acute ischemic left MCA infarct and subsequent craniotomy - was first admitted on 03/01/18.  He was discharged from hospital on 04/06/18    How long can you stand comfortably?  stand for 20 mins    How long can you walk comfortably?  10 mins    Patient Stated Goals  to walk without AD and live on his own.    Currently in Pain?  No/denies    Pain Score  0-No pain         09/22/18 1104  Transfers  Transfers Sit to Stand;Stand to Sit  Sit to Stand 5: Supervision;From bed;From chair/3-in-1;With upper extremity assist;Without upper extremity assist  Stand to Sit 5: Supervision;With upper extremity assist;Without upper  extremity assist;To bed;To chair/3-in-1  Ambulation/Gait  Ambulation/Gait Yes  Ambulation/Gait Assistance 5: Supervision;4: Min guard  Ambulation/Gait Assistance Details cues to narrow base of support, for increased stance time on right LE and for incr quad activation to decr recurvatum when fatigued.   Ambulation Distance (Feet)  (around gym with activities)  Assistive device None  Gait Pattern Step-through pattern;Decreased arm swing - right;Decreased stance time - right;Decreased stride length;Decreased weight shift to right;Right hip hike;Right steppage;Right foot flat;Right flexed knee in stance;Antalgic;Lateral hip instability;Trunk flexed;Abducted- right;Decreased step length - left  Ambulation Surface Level;Indoor  High Level Balance  High Level Balance Activities Side stepping;Marching forwards;Marching backwards;Tandem walking (tandem fwd/bwd)  High Level Balance Comments on blue mat in parallel bars with no to single UE support on bars: 3 laps each with cues on technique, weight shifting and form.    Self-Care  Self-Care Other Self-Care Comments  Other Self-Care Comments  showed pt how to don night splint by donning in session. pictures taken on pt's personal phone of proper positioning to assist pt with getting dad to help at home.   Neuro Re-ed   Neuro Re-ed Details  for strengthening/muscle re-ed/coordination: fwd gait along ~50 foot hallway with head movements left<>right, then up<>down for 2 laps each, min guard to min assist as right leg fatigued; standing at bottom of stairs:                       Knee/Hip Exercises: Machines for Strengthening  Total Gym Leg Press bil LE's 100#s with 5 sec holds for 15 reps, then right LE only 60#s 5 sec holds x 15 reps. manual assist at right knee for control/to maintain alignment.       09/22/18 1135  Balance Exercises: Standing  Standing Eyes Closed Wide (BOA);Narrow base of support (BOS);Foam/compliant surface;Head turns;Other reps  (comment);30 secs;Limitations  Balance Exercises: Standing  Standing Eyes Closed Limitations on airex with chair in front for safety: wide base of support progressing to narrow base of support for EC no head movements, progressing to EC head movements left<>right, then up<>down. min guard to min assist for balance.        PT Short Term Goals - 09/01/18 2300      PT SHORT TERM GOAL #1   Title  Sit to/from stand chairs without UE assist with supervision    Baseline  MET 09/01/2018    Status  Achieved      PT SHORT TERM GOAL #2   Title  Patient ambulates with straight cane scanning environment without LOB maintaining path with supervision.     Baseline  MET 09/01/2018    Status  Achieved      PT SHORT TERM GOAL #3   Title  Patient  ambulates with straight cane & AFO >2.00 ft/sec    Baseline  MET 09/01/2018    Status  Achieved        PT Long Term Goals - 07/28/18 1303      PT LONG TERM GOAL #1   Title  Patient will increase six minute walk test distance to >1000 for progression to community ambulator and improve gait ability    Baseline  585 feet 05/10/18: 563f 10/31: 6423f   Time  12    Period  Weeks    Status  On-going    Target Date  09/30/18      PT LONG TERM GOAL #2   Title  Patient will increase 10 meter walk test to >3.28 ft/sec (1.29m25m as to improve gait speed for better community ambulation and to reduce fall risk.    Baseline  1.57 ft/sec (0.48 m/sec) 05/10/18: 2.33 ft/sec (0.57m59m10/31: 2.53 ft/sec (0.7m/33m12/19: 1.87 ft/sec cane quad tip & 1.65 ft/sec no AD minA    Time  12    Period  Weeks    Status  On-going    Target Date  09/30/18      PT LONG TERM GOAL #3   Title  Patient will increase Berg Balance score by >45/56 to demonstrate decreased fall risk during functional activities.    Baseline  25/56 05/10/18: 36/56 10/31: 39/56  12/19: 39/56    Time  12    Period  Weeks    Status  Revised    Target Date  09/30/18      PT LONG TERM GOAL #4   Title   Patient will be require no assist with ascend/descend 3 steps with single rail including on right side modified independent    Baseline  12/19: supervision with rail on left side only (which requires 2 rails to be on left side ascend & descend)    Time  12    Period  Weeks    Status  Revised    Target Date  09/30/18      PT LONG TERM GOAL #5   Title  Dynamic Gait Index with cane quad tip >12/24    Baseline  12/19: 4/24    Status  New    Target Date  09/30/18         09/22/18 1103  Plan  Clinical Impression Statement Today's skilled session continued to address LE strengthening, balance and gait with no AD. The pt is progressing toward goals and should benefit from continued PT to progress toward unmet goals.   Pt will benefit from skilled therapeutic intervention in order to improve on the following deficits Abnormal gait;Decreased balance;Decreased endurance;Decreased mobility;Difficulty walking;Impaired sensation;Increased muscle spasms;Impaired UE functional use;Impaired flexibility;Decreased strength;Decreased coordination;Decreased activity tolerance  Rehab Potential Good  PT Frequency 2x / week (16 visits)  PT Duration 8 weeks  PT Treatment/Interventions Manual techniques;Patient/family education;Neuromuscular re-education;Balance training;Functional mobility training;Therapeutic activities;Therapeutic exercise;Gait training;Orthotic Fit/Training;Passive range of motion;ADLs/Self Care Home Management;Electrical Stimulation;Stair training;Energy conservation;Taping;DME Instruction  PT Next Visit Plan work towards LTGs,Parkerntinue to work SLS and RLE stregnthening/weightbearing. Gait training with straight cane normal tip.  PT Home Exercise Plan Access Code: [email or web address]   Consulted and Agree with Plan of Care Patient       Patient will benefit from skilled therapeutic intervention in order to improve the following deficits and impairments:  Abnormal gait, Decreased balance, Decreased  endurance, Decreased mobility, Difficulty walking, Impaired sensation, Increased muscle spasms, Impaired UE functional use, Impaired flexibility,  Decreased strength, Decreased coordination, Decreased activity tolerance  Visit Diagnosis: Unsteadiness on feet  Hemiplegia and hemiparesis following cerebral infarction affecting right dominant side (HCC)  Muscle weakness (generalized)  Other disturbances of skin sensation     Problem List Patient Active Problem List   Diagnosis Date Noted  . CVA (cerebral vascular accident) ([place]) 05/02/2018  . Neurogenic bowel   . Neurogenic bladder   . Spastic hemiparesis ([place])   . 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 Cerebral infarction due to embolism of left middle cerebral artery (HCC) s/p thrombectomy 03/01/2018  . Carotid artery dissection ([place]) Left 03/01/2018  . Acute ischemic right MCA stroke ([place]) 03/01/2018  . Right hemiparesis ([place])   . Dysphagia, post-stroke   . Global aphasia   . Leukocytosis   . Folic acid deficiency 80/32/1224  . B12 deficiency 02/23/2018  . Hyperhomocysteinemia ([place]) 02/23/2018  . Smoker     Willow Ora, PTA, [facility] [address] [place], [place] [address] [phone] 09/22/18, 11:54 AM   Name: Xavier Villegas MRN: [id number] Date of Birth: 1980-12-13

## 2018-09-27 ENCOUNTER — Ambulatory Visit: Payer: BLUE CROSS/BLUE SHIELD | Admitting: Physical Therapy

## 2018-09-27 ENCOUNTER — Ambulatory Visit: Payer: BLUE CROSS/BLUE SHIELD | Admitting: Occupational Therapy

## 2018-09-27 ENCOUNTER — Encounter: Payer: Self-pay | Admitting: Physical Therapy

## 2018-09-27 DIAGNOSIS — R278 Other lack of coordination: Secondary | ICD-10-CM | POA: Diagnosis not present

## 2018-09-27 DIAGNOSIS — I69351 Hemiplegia and hemiparesis following cerebral infarction affecting right dominant side: Secondary | ICD-10-CM | POA: Diagnosis not present

## 2018-09-27 DIAGNOSIS — R2681 Unsteadiness on feet: Secondary | ICD-10-CM | POA: Diagnosis not present

## 2018-09-27 DIAGNOSIS — R2689 Other abnormalities of gait and mobility: Secondary | ICD-10-CM | POA: Diagnosis not present

## 2018-09-27 DIAGNOSIS — R208 Other disturbances of skin sensation: Secondary | ICD-10-CM | POA: Diagnosis not present

## 2018-09-27 DIAGNOSIS — M6281 Muscle weakness (generalized): Secondary | ICD-10-CM | POA: Diagnosis not present

## 2018-09-27 DIAGNOSIS — R482 Apraxia: Secondary | ICD-10-CM | POA: Diagnosis not present
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
## 2018-09-28 NOTE — Therapy (Signed)
[facility] [address] [place], [place], [address] Phone: [phone]   Fax:  [phone]  Physical Therapy Treatment  Patient Details  Name: Xavier Villegas MRN: [id number] Date of Birth: 10/12/1980 Referring Provider (PT): Reesa Chew   Encounter Date: 09/27/2018   09/27/18 1020  PT Visits / Re-Eval  Visit Number 33  Number of Visits 34  Authorization  Authorization Type 08/23/2018: Father reports on 09/09/17 he transitions to [facility] but coverage does not change only amount they pay for premium.  BCBS $3250 deductible & $6350oop max have been met. Pt is covered 100%  Authorization Time Period VL: PT & OT 30 with insurance calendar year Aug 1-July 31 with 14 reported used. However visit count [facility] notes 18 for both PT & OT.  If PT & OT same day counts as one  Authorization - Visit Number 15  Authorization - Number of Visits 16  PT Time Calculation  PT Start Time 1019  PT Stop Time 1100  PT Time Calculation (min) 41 min  PT - End of Session  Equipment Utilized During Treatment Gait belt  Activity Tolerance Patient tolerated treatment well  Behavior During Therapy WFL for tasks assessed/performed       Past Medical History:  Diagnosis Date  . Aphasia   . GERD (gastroesophageal reflux disease)   . Hemiparesis ([place]) 02/2018   right side  . Hyperhomocysteinemia ([place])   . Neurogenic bladder 02/2018   04/26/18 inporving  . Spastic hemiparesis (HCC)    right side  . Stroke ([place])    Left MCA infart  . Tobacco abuse     Past Surgical History:  Procedure Laterality Date  . CRANIOTOMY Left 02/21/2018   Procedure: DECOMPRESSIVE CRANIECTOMY with bone flap to abdomen;  Surgeon: [name], MD;  Location: [facility];  Service: Neurosurgery;  Laterality: Left;  DECOMPRESSIVE CRANIECTOMY with bone flap to abdomen  . CRANIOTOMY N/A 05/02/2018   Procedure: RE-IMPLANTATION OF CRANIAL FLAP;  Surgeon: [name], MD;   Location: [facility];  Service: Neurosurgery;  Laterality: N/A;  RE-IMPLANTATION OF CRANIAL FLAP  . IR ANGIO INTRA EXTRACRAN SEL COM CAROTID INNOMINATE UNI R MOD SED  02/19/2018  . IR ANGIO VERTEBRAL SEL VERTEBRAL BILAT MOD SED  02/19/2018  . IR CT HEAD LTD  02/19/2018  . IR PERCUTANEOUS ART THROMBECTOMY/INFUSION INTRACRANIAL INC DIAG ANGIO  02/19/2018  . IR US GUIDE VASC ACCESS RIGHT  02/19/2018  . RADIOLOGY WITH ANESTHESIA N/A 02/19/2018   Procedure: IR WITH ANESTHESIA CODE STROKE;  Surgeon: Corrie Mckusick, DO;  Location: [facility];  Service: Anesthesiology;  Laterality: N/A;  . TEE WITHOUT CARDIOVERSION N/A 03/01/2018   Procedure: TRANSESOPHAGEAL ECHOCARDIOGRAM (TEE);  Surgeon: Sanda Klein, MD;  [facility]: [facility] ENDOSCOPY;  Service: Cardiovascular;  Laterality: N/A;    There were no vitals filed for this visit.     09/27/18 1020  Symptoms/Limitations  Subjective No new complaints. No falls or pain to report.   Pertinent History Mr. Bas  had left MCA infarct and subsequent craniectomy.  He was discharged from rehab several weeks ago. He has been home with his family  He is having occasional spasms on the right side still.Patient has Right hemiparesis, dysphagia, and aphasia secondary to large left MCA infarct with hemorrhagic transformation s/p hemicraniectomy.He had acute ischemic left MCA infarct and subsequent craniotomy - was first admitted on 03/01/18.  He was discharged from hospital on 04/06/18  How long can you stand comfortably? stand for 20 mins  How long can you walk comfortably? 10 mins  Patient Stated Goals to walk without AD and live on his own.  Pain Assessment  Currently in Pain? No/denies  Pain Score 0      09/27/18 1029  Ambulation/Gait  Ambulation/Gait Yes  Ambulation/Gait Assistance 5: Supervision;4: Min guard  Ambulation/Gait Assistance Details cues to narrow base of support. min guard with dynamic tasks, otherwise supervision  Ambulation Distance (Feet)  (around gym with  activities)  Assistive device None  Gait Pattern Step-through pattern;Decreased arm swing - right;Decreased stance time - right;Decreased stride length;Decreased weight shift to right;Right hip hike;Right steppage;Right foot flat;Right flexed knee in stance;Antalgic;Lateral hip instability;Trunk flexed;Abducted- right;Decreased step length - left  Ambulation Surface Level;Indoor  Stairs Yes  Stairs Assistance 5: Supervision;4: Min guard  Stairs Assistance Details (indicate cue type and reason) cues for incr hip/knee flexion/decr right circumduction with ascending for improved control/step placement; cues for "soft knee" prior to lowering left LE with right LE to decr tone/improved control with descending   Stair Management Technique One rail Right;One rail Left;Alternating pattern;Forwards  Number of Stairs 4 (x4 reps)  Height of Stairs 6  High Level Balance  High Level Balance Activities Head turns;Other (comment);Negotitating around obstacles;Negotiating over obstacles (speed changes )  High Level Balance Comments gait around track: had pt engage in enviromental scanning all directions with min guard assist for balance; obstacle course of 3 bolsters<>2 hoola hoops on floor- stepping over bolsters with weaving around hoola hoops x 4 laps each way, min guard to min assist for balance, cues on sequencing/technique/more narrowed base of support with gait.                       09/27/18 1057  Balance Exercises: Standing  Rockerboard Anterior/posterior;Head turns;EO;EC;30 seconds;10 reps  Balance Exercises: Standing  Rebounder Limitations ant/post direction on balance board in corner with chair in front for safety: rocking the board with EO progressing to EC; then holding the board steady- EC no head movements, progressing to EC head movements left<>right, up<>down. min guard to min assist for balance. cues on posture and weight shifting for balance assistance.                          PT  Short Term Goals - 09/01/18 2300      PT SHORT TERM GOAL #1   Title  Sit to/from stand chairs without UE assist with supervision    Baseline  MET 09/01/2018    Status  Achieved      PT SHORT TERM GOAL #2   Title  Patient ambulates with straight cane scanning environment without LOB maintaining path with supervision.     Baseline  MET 09/01/2018    Status  Achieved      PT SHORT TERM GOAL #3   Title  Patient ambulates with straight cane & AFO >2.00 ft/sec    Baseline  MET 09/01/2018    Status  Achieved        PT Long Term Goals - 07/28/18 1303      PT LONG TERM GOAL #1   Title  Patient will increase six minute walk test distance to >1000 for progression to community ambulator and improve gait ability    Baseline  585 feet 05/10/18: 555ft 10/31: 640ft    Time  12    Period  Weeks    Status  On-going    Target Date  09/30/18        PT LONG TERM GOAL #2   Title  Patient will increase 10 meter walk test to >3.28 ft/sec (1.64ms) as to improve gait speed for better community ambulation and to reduce fall risk.    Baseline  1.57 ft/sec (0.48 m/sec) 05/10/18: 2.33 ft/sec (0.720m) 10/31: 2.53 ft/sec (0.7769m  12/19: 1.87 ft/sec cane quad tip & 1.65 ft/sec no AD minA    Time  12    Period  Weeks    Status  On-going    Target Date  09/30/18      PT LONG TERM GOAL #3   Title  Patient will increase Berg Balance score by >45/56 to demonstrate decreased fall risk during functional activities.    Baseline  25/56 05/10/18: 36/56 10/31: 39/56  12/19: 39/56    Time  12    Period  Weeks    Status  Revised    Target Date  09/30/18      PT LONG TERM GOAL #4   Title  Patient will be require no assist with ascend/descend 3 steps with single rail including on right side modified independent    Baseline  12/19: supervision with rail on left side only (which requires 2 rails to be on left side ascend & descend)    Time  12    Period  Weeks    Status  Revised    Target Date  09/30/18      PT LONG  TERM GOAL #5   Title  Dynamic Gait Index with cane quad tip >12/24    Baseline  12/19: 4/24    Status  New    Target Date  09/30/18          09/27/18 1020  Plan  Clinical Impression Statement Today's skilled session continued to address gait with no AD, stairs and balance reactions with no issues reported. The pt is progressing well toward goals and should benefit from continued PT to progress toward unmet goals.   Pt will benefit from skilled therapeutic intervention in order to improve on the following deficits Abnormal gait;Decreased balance;Decreased endurance;Decreased mobility;Difficulty walking;Impaired sensation;Increased muscle spasms;Impaired UE functional use;Impaired flexibility;Decreased strength;Decreased coordination;Decreased activity tolerance  Rehab Potential Good  PT Frequency 2x / week (16 visits)  PT Duration 8 weeks  PT Treatment/Interventions Manual techniques;Patient/family education;Neuromuscular re-education;Balance training;Functional mobility training;Therapeutic activities;Therapeutic exercise;Gait training;Orthotic Fit/Training;Passive range of motion;ADLs/Self Care Home Management;Electrical Stimulation;Stair training;Energy conservation;Taping;DME Instruction  PT Next Visit Plan assess LTGs  PT Home Exercise Plan Access Code: [email or web address]   Consulted and Agree with Plan of Care Patient         Patient will benefit from skilled therapeutic intervention in order to improve the following deficits and impairments:  Abnormal gait, Decreased balance, Decreased endurance, Decreased mobility, Difficulty walking, Impaired sensation, Increased muscle spasms, Impaired UE functional use, Impaired flexibility, Decreased strength, Decreased coordination, Decreased activity tolerance  Visit Diagnosis: Hemiplegia and hemiparesis following cerebral infarction affecting right dominant side (HCC)  Unsteadiness on feet  Muscle weakness (generalized)     Problem  List Patient Active Problem List   Diagnosis Date Noted  . CVA (cerebral vascular accident) ([facility]CC[place]9/23/2019  . Neurogenic bowel   . Neurogenic bladder   . Spastic hemiparesis ([facility] . 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 Cerebral infarction due to embolism of left middle cerebral artery (HCC) s/p thrombectomy 03/01/2018  . Carotid artery dissection ([place]CC[place] 03/01/2018  . Acute ischemic right MCA stroke ([place]CC[place]7/23/2019  .  Right hemiparesis ([place])   . Dysphagia, post-stroke   . Global aphasia   . Leukocytosis   . Folic acid deficiency 17/79/3903  . B12 deficiency 02/23/2018  . Hyperhomocysteinemia ([place]) 02/23/2018  . Smoker     Willow Ora, PTA, [facility] [place] [address] [place], [place] [address] [phone] 09/28/18, 9:37 PM   Name: Xavier Villegas MRN: [id number] Date of Birth: [birth year]

## 2018-09-29 ENCOUNTER — Encounter: Payer: Self-pay | Admitting: Physical Therapy

## 2018-09-29 ENCOUNTER — Encounter: Payer: BLUE CROSS/BLUE SHIELD | Admitting: Occupational Therapy

## 2018-09-29 ENCOUNTER — Ambulatory Visit: Payer: BLUE CROSS/BLUE SHIELD | Admitting: Physical Therapy

## 2018-09-29 ENCOUNTER — Ambulatory Visit: Payer: BLUE CROSS/BLUE SHIELD | Admitting: Occupational Therapy

## 2018-09-29 DIAGNOSIS — R2689 Other abnormalities of gait and mobility: Secondary | ICD-10-CM | POA: Diagnosis not present

## 2018-09-29 DIAGNOSIS — R482 Apraxia: Secondary | ICD-10-CM | POA: Diagnosis not present

## 2018-09-29 DIAGNOSIS — M6281 Muscle weakness (generalized): Secondary | ICD-10-CM

## 2018-09-29 DIAGNOSIS — R278 Other lack of coordination: Secondary | ICD-10-CM | POA: Diagnosis not present

## 2018-09-29 DIAGNOSIS — I69351 Hemiplegia and hemiparesis following cerebral infarction affecting right dominant side: Secondary | ICD-10-CM

## 2018-09-29 DIAGNOSIS — R2681 Unsteadiness on feet: Secondary | ICD-10-CM

## 2018-09-29 DIAGNOSIS — R208 Other disturbances of skin sensation: Secondary | ICD-10-CM | POA: Diagnosis not present

## 2018-09-29 NOTE — Patient Instructions (Signed)
  1. Sit up tall, have assist holding Rt hand over edge of wide chair, cross over body reaching forward w/ Lt arm, allowing Rt arm to bend as he reaches w/ Lt arm. Then rotating trunk and bringing Lt arm around to face back diagonal while straightening Rt arm. Someone will need to help [name] keep Rt hand in contact w/ chair  2. Sit up tall, come over on Rt elbow, push from Rt arm to come slightly up at varying increasing degrees with help to Rt hand  3. Sit with chair in front of him, place both hands on chair w/ assist to keep Rt hand on chair, and come up to squat position using BOTH legs equally, shift weight slightly to Rt side, then lift Lt arm to reach forward, then replace Lt arm and slowly lower back to sitting using BOTH legs equally. Make sure feet are in proper position for this exercise  4. Sit in front of [name] - place both hands on assistant's shoulders, have [name] push w/ heel of hands (both arms together, then Rt arm only), then have him begin reaching with Lt arm while keeping a slight push into shoulder w/ Rt arm  5. With beach ball - help [name] keep Rt hand on ball, then have him practice slowly lowering to floor straightening elbows, then back to lap bending elbows EQUALLY and squeezing shoulder blades together in back
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
## 2018-09-30 NOTE — Therapy (Signed)
[facility] [address] [place], [place], [address] Phone: [phone]   Fax:  [phone]  Physical Therapy Treatment & Discharge Summary  Patient Details  Name: Xavier Villegas MRN: [id number] Date of Birth: [birth year] Referring Provider (PT): Reesa Chew   Encounter Date: 09/29/2018    PHYSICAL THERAPY DISCHARGE SUMMARY  Visits from Start of Care: 34  Current functional level related to goals / functional outcomes: See below   Remaining deficits: See below   Education / Equipment: HEP & ongoing fitness plan,  Recommendation for referral to [facility] clinic.   Plan: Patient agrees to discharge.  Patient goals were partially met. Patient is being discharged due to financial reasons.  ?????        PT End of Session - 09/29/18 1500    Visit Number  34    Number of Visits  34    Authorization Type  08/23/2018: Father reports on 09/09/17 he transitions to [facility] but coverage does not change only amount they pay for premium.  BCBS $3250 deductible & $6350oop max have been met. Pt is covered 100%    Authorization Time Period  VL: PT & OT 30 with insurance calendar year Aug 1-July 31 with 14 reported used. However visit count [facility] notes 18 for both PT & OT.  If PT & OT same day counts as one    Authorization - Visit Number  16    Authorization - Number of Visits  16    PT Start Time  1230    PT Stop Time  1315    PT Time Calculation (min)  45 min    Equipment Utilized During Treatment  Gait belt    Activity Tolerance  Patient tolerated treatment well    Behavior During Therapy  WFL for tasks assessed/performed       Past Medical History:  Diagnosis Date  . Aphasia   . GERD (gastroesophageal reflux disease)   . Hemiparesis ([place]) 02/2018   right side  . Hyperhomocysteinemia ([place])   . Neurogenic bladder 02/2018   04/26/18 inporving  . Spastic hemiparesis (HCC)    right side  . Stroke  ([place])    Left MCA infart  . Tobacco abuse     Past Surgical History:  Procedure Laterality Date  . CRANIOTOMY Left 02/21/2018   Procedure: DECOMPRESSIVE CRANIECTOMY with bone flap to abdomen;  Surgeon: [name], MD;  Location: [facility];  Service: Neurosurgery;  Laterality: Left;  DECOMPRESSIVE CRANIECTOMY with bone flap to abdomen  . CRANIOTOMY N/A 05/02/2018   Procedure: RE-IMPLANTATION OF CRANIAL FLAP;  Surgeon: [name], MD;  Location: [facility];  Service: Neurosurgery;  Laterality: N/A;  RE-IMPLANTATION OF CRANIAL FLAP  . IR ANGIO INTRA EXTRACRAN SEL COM CAROTID INNOMINATE UNI R MOD SED  02/19/2018  . IR ANGIO VERTEBRAL SEL VERTEBRAL BILAT MOD SED  02/19/2018  . IR CT HEAD LTD  02/19/2018  . IR PERCUTANEOUS ART THROMBECTOMY/INFUSION INTRACRANIAL INC DIAG ANGIO  02/19/2018  . IR US GUIDE VASC ACCESS RIGHT  02/19/2018  . RADIOLOGY WITH ANESTHESIA N/A 02/19/2018   Procedure: IR WITH ANESTHESIA CODE STROKE;  Surgeon: Corrie Mckusick, DO;  Location: [facility];  Service: Anesthesiology;  Laterality: N/A;  . TEE WITHOUT CARDIOVERSION N/A 03/01/2018   Procedure: TRANSESOPHAGEAL ECHOCARDIOGRAM (TEE);  Surgeon: Sanda Klein, MD;  [facility]: [facility] ENDOSCOPY;  Service: Cardiovascular;  Laterality: N/A;    There were no vitals filed for this visit.  Subjective Assessment - 09/29/18  1230    Subjective  No falls. He has been wearing night brace.     Pertinent History  Xavier Villegas  had left MCA infarct and subsequent craniectomy.  He was discharged from rehab several weeks ago. He has been home with his family  He is having occasional spasms on the right side still.Patient has Right hemiparesis, dysphagia, and aphasia secondary to large left MCA infarct with hemorrhagic transformation s/p hemicraniectomy.He had acute ischemic left MCA infarct and subsequent craniotomy - was first admitted on 03/01/18.  He was discharged from hospital on 04/06/18    How long can you stand comfortably?  stand for 20 mins    How long can  you walk comfortably?  10 mins    Patient Stated Goals  to walk without AD and live on his own.    Currently in Pain?  No/denies         [facility] PT Assessment - 09/29/18 1230      Transfers   Transfers  Sit to Stand;Stand to Sit    Sit to Stand  6: Modified independent (Device/Increase time);Without upper extremity assist;From chair/3-in-1    Stand to Sit  6: Modified independent (Device/Increase time);Without upper extremity assist;To chair/3-in-1      Ambulation/Gait   Ambulation/Gait  Yes    Ambulation/Gait Assistance  6: Modified independent (Device/Increase time)    Ambulation Distance (Feet)  1000 Feet    Assistive device  Straight cane;Other (Comment)   AFO   Gait Pattern  Step-through pattern;Decreased arm swing - right;Decreased step length - left;Decreased stance time - right;Decreased weight shift to right;Right hip hike;Right foot flat;Right flexed knee in stance;Antalgic;Abducted- right    Ambulation Surface  Indoor;Level   has ambulated outdoors but not d/c due to weather   Gait velocity  2.25 ft/sec   initial was 1.97f/sec   Stairs  Yes    Stairs Assistance  6: Modified independent (Device/Increase time)    Stair Management Technique  One rail Left;Alternating pattern;Forwards    Number of Stairs  4    Ramp  6: Modified independent (Device)   cane & AFO   Curb  6: Modified independent (Device/increase time)   cane & AFO     6 Minute Walk- Baseline   6 Minute Walk- Baseline  yes    HR (bpm)  80    02 Sat (%RA)  96 %      6 Minute walk- Post Test   6 Minute Walk Post Test  yes    HR (bpm)  88    02 Sat (%RA)  99 %      6 minute walk test results    Aerobic Endurance Distance Walked  916   cane & AFO     Berg Balance Test   Sit to Stand  Able to stand without using hands and stabilize independently    Standing Unsupported  Able to stand safely 2 minutes    Sitting with Back Unsupported but Feet Supported on Floor or Stool  Able to sit safely and securely  2 minutes    Stand to Sit  Sits safely with minimal use of hands    Transfers  Able to transfer safely, minor use of hands    Standing Unsupported with Eyes Closed  Able to stand 10 seconds safely    Standing Ubsupported with Feet Together  Able to place feet together independently and stand 1 minute safely    From Standing, Reach Forward with Outstretched Arm  Can reach confidently >25 cm (10")    From Standing Position, Pick up Object from [place] to pick up shoe safely and easily    From Standing Position, Turn to Look Behind Over each Shoulder  Looks behind one side only/other side shows less weight shift    Turn 360 Degrees  Able to turn 360 degrees safely but slowly    Standing Unsupported, Alternately Place Feet on Step/Stool  Able to complete 4 steps without aid or supervision    Standing Unsupported, One Foot in Front  Able to plae foot ahead of the other independently and hold 30 seconds    Standing on One Leg  Able to lift leg independently and hold equal to or more than 3 seconds    Total Score  48    Berg comment:  on 05/10/2018 Merrilee Jansky was 36/56      Dynamic Gait Index   Level Surface  Mild Impairment    Change in Gait Speed  Mild Impairment    Gait with Horizontal Head Turns  Mild Impairment    Gait with Vertical Head Turns  Mild Impairment    Gait and Pivot Turn  Mild Impairment    Step Over Obstacle  Moderate Impairment    Step Around Obstacles  Mild Impairment    Steps  Mild Impairment    Total Score  15    DGI comment:  On 07/28/2018 DGI 4/24                           PT Education - 09/29/18 1310    Education Details  ongoing HEP & fitness plan,  form for referral to [facility]) Educated  Patient;Parent(s)    Methods  Explanation;Demonstration;Verbal cues    Comprehension  Verbalized understanding       PT Short Term Goals - 09/01/18 2300      PT SHORT TERM GOAL #1   Title  Sit to/from stand chairs without UE assist with  supervision    Baseline  MET 09/01/2018    Status  Achieved      PT SHORT TERM GOAL #2   Title  Patient ambulates with straight cane scanning environment without LOB maintaining path with supervision.     Baseline  MET 09/01/2018    Status  Achieved      PT SHORT TERM GOAL #3   Title  Patient ambulates with straight cane & AFO >2.00 ft/sec    Baseline  MET 09/01/2018    Status  Achieved        PT Long Term Goals - 09/29/18 1500      PT LONG TERM GOAL #1   Title  Patient will increase six minute walk test distance to >1000 for progression to community ambulator and improve gait ability    Baseline  Partially MET at discharge 09/29/2018: 916' with cane & AFO.   Was 585 feet 05/10/18: 561f 06/09/18: 6453f   Time  12    Period  Weeks    Status  Partially Met      PT LONG TERM GOAL #2   Title  Patient will increase 10 meter walk test to >3.28 ft/sec (1.22m45m as to improve gait speed for better community ambulation and to reduce fall risk.    Baseline  NOT MET At discharge on 09/29/2018, Gait Velocity 2.25 ft/sec (0.69 m/sec)    1.57 ft/sec (0.48 m/sec) on 05/10/18  Time  12    Period  Weeks    Status  Not Met      PT LONG TERM GOAL #3   Title  Patient will increase Berg Balance score by >45/56 to demonstrate decreased fall risk during functional activities.    Baseline  MET 09/29/2018  Berg Balance Test 48/56    Time  12    Period  Weeks    Status  Achieved      PT LONG TERM GOAL #4   Title  Patient will be require no assist with ascend/descend 3 steps with single rail including on right side modified independent    Baseline  MET 09/29/2018    Time  12    Period  Weeks    Status  Achieved      PT LONG TERM GOAL #5   Title  Dynamic Gait Index with cane quad tip >12/24    Baseline  MET 09/29/2018  DGI with cane standard tip 15/24    Status  Achieved            Plan - 09/29/18 1500    Clinical Impression Statement  This patient made significant progress with mobility.  Some of LTGs were set significantly high at initial evaluation to benchmark levels. His gains are beyond minimal detectable levels as follows. 6 Minute Walk Test to 916' with cane modified independent (using initial gait velocity to predict 6 minutes 480' with [facility] with assist).  Gait Velocity improved from 0.68f/sec to 2.25 ft/sec.  Berg Balance 48/56 from 36/56 on 05/10/2018.  Dynamic Gait Index 15/24 with cane from 4/24 on 07/28/2018.  Patient would benefit from additional PT but he has met insurance limitations.  Patient and father given [facility].     Rehab Potential  Good    PT Frequency  2x / week   16 visits   PT Duration  8 weeks    PT Treatment/Interventions  Manual techniques;Patient/family education;Neuromuscular re-education;Balance training;Functional mobility training;Therapeutic activities;Therapeutic exercise;Gait training;Orthotic Fit/Training;Passive range of motion;ADLs/Self Care Home Management;Electrical Stimulation;Stair training;Energy conservation;Taping;DME Instruction    PT Next Visit Plan  discharge PT as met insurance limitations,  recommend [facility] referral.    PT Home Exercise Plan  Access Code: [email or web address]     Consulted and Agree with Plan of Care  Patient       Patient will benefit from skilled therapeutic intervention in order to improve the following deficits and impairments:  Abnormal gait, Decreased balance, Decreased endurance, Decreased mobility, Difficulty walking, Impaired sensation, Increased muscle spasms, Impaired UE functional use, Impaired flexibility, Decreased strength, Decreased coordination, Decreased activity tolerance  Visit Diagnosis: Hemiplegia and hemiparesis following cerebral infarction affecting right dominant side (HCC)  Unsteadiness on feet  Muscle weakness (generalized)  Other lack of coordination  Other abnormalities of gait and mobility     Problem  List Patient Active Problem List   Diagnosis Date Noted  . CVA (cerebral vascular accident) ([facility] 05/02/2018  . Neurogenic bowel   . Neurogenic bladder   . Spastic hemiparesis ([facility]   . 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 Cerebral infarction due to embolism of left middle cerebral artery (HCC) s/p thrombectomy 03/01/2018  . Carotid artery dissection ([facility] Left 03/01/2018  . Acute ischemic right MCA stroke ([facility] 03/01/2018  . Right hemiparesis ([facility]   . Dysphagia, post-stroke   . Global aphasia   . Leukocytosis   .  Folic acid deficiency 88/41/6606  . B12 deficiency 02/23/2018  . Hyperhomocysteinemia ([place]) 02/23/2018  . Smoker     Jahniyah Revere PT, DPT 09/30/2018, 10:58 AM  [facility] [address] [place], [place], [address] Phone: [phone]   Fax:  [phone]  Name: Andrews Tener MRN: [id number] Date of Birth: [birth year]

## 2018-10-03 ENCOUNTER — Ambulatory Visit: Payer: BLUE CROSS/BLUE SHIELD | Admitting: Physical Therapy

## 2018-10-04 ENCOUNTER — Ambulatory Visit: Payer: BLUE CROSS/BLUE SHIELD | Admitting: Physical Therapy

## 2018-10-04 ENCOUNTER — Encounter: Payer: BLUE CROSS/BLUE SHIELD | Admitting: Occupational Therapy

## 2018-10-06 ENCOUNTER — Encounter: Payer: BLUE CROSS/BLUE SHIELD | Admitting: Occupational Therapy

## 2018-10-06 ENCOUNTER — Ambulatory Visit: Payer: BLUE CROSS/BLUE SHIELD | Admitting: Physical Therapy

## 2018-10-10 ENCOUNTER — Ambulatory Visit: Payer: BLUE CROSS/BLUE SHIELD | Admitting: Physical Medicine & Rehabilitation

## 2018-10-11 ENCOUNTER — Ambulatory Visit: Payer: BLUE CROSS/BLUE SHIELD | Admitting: Physical Therapy

## 2018-10-11 ENCOUNTER — Encounter: Payer: BLUE CROSS/BLUE SHIELD | Admitting: Occupational Therapy

## 2018-10-13 ENCOUNTER — Ambulatory Visit: Payer: BLUE CROSS/BLUE SHIELD | Admitting: Physical Therapy

## 2018-10-13 ENCOUNTER — Encounter: Payer: BLUE CROSS/BLUE SHIELD | Admitting: Occupational Therapy

## 2018-10-15 ENCOUNTER — Emergency Department (HOSPITAL_COMMUNITY): Payer: BLUE CROSS/BLUE SHIELD

## 2018-10-15 ENCOUNTER — Emergency Department (HOSPITAL_COMMUNITY)
Admission: EM | Admit: 2018-10-15 | Discharge: 2018-10-15 | Disposition: A | Discharge status: Home / Self Care | Payer: BLUE CROSS/BLUE SHIELD | Attending: Emergency Medicine | Admitting: Emergency Medicine

## 2018-10-15 ENCOUNTER — Encounter (HOSPITAL_COMMUNITY): Payer: Self-pay

## 2018-10-15 ENCOUNTER — Other Ambulatory Visit: Payer: Self-pay

## 2018-10-15 DIAGNOSIS — R05 Cough: Secondary | ICD-10-CM

## 2018-10-15 DIAGNOSIS — Z87891 Personal history of nicotine dependence: Secondary | ICD-10-CM | POA: Diagnosis not present

## 2018-10-15 DIAGNOSIS — Z79899 Other long term (current) drug therapy: Secondary | ICD-10-CM | POA: Insufficient documentation

## 2018-10-15 DIAGNOSIS — R479 Unspecified speech disturbances: Secondary | ICD-10-CM | POA: Insufficient documentation

## 2018-10-15 DIAGNOSIS — R569 Unspecified convulsions: Secondary | ICD-10-CM | POA: Diagnosis not present

## 2018-10-15 DIAGNOSIS — R059 Cough, unspecified: Secondary | ICD-10-CM

## 2018-10-15 DIAGNOSIS — R531 Weakness: Secondary | ICD-10-CM | POA: Diagnosis not present

## 2018-10-15 DIAGNOSIS — R000 Tachycardia, unspecified: Secondary | ICD-10-CM | POA: Diagnosis not present

## 2018-10-15 LAB — CBG MONITORING, ED: Glucose-Capillary: 143 mg/dL — ABNORMAL HIGH (ref 70–99)

## 2018-10-15 LAB — CBC WITH DIFFERENTIAL/PLATELET
Abs Immature Granulocytes: 0.05 10*3/uL (ref 0.00–0.07)
Basophils Absolute: 0.1 10*3/uL (ref 0.0–0.1)
Basophils Relative: 0 %
Eosinophils Absolute: 0.2 10*3/uL (ref 0.0–0.5)
Eosinophils Relative: 1 %
HCT: 48 % (ref 39.0–52.0)
HEMOGLOBIN: 16.9 g/dL (ref 13.0–17.0)
Immature Granulocytes: 0 %
Lymphocytes Relative: 26 %
Lymphs Abs: 3.2 10*3/uL (ref 0.7–4.0)
MCH: 31.2 pg (ref 26.0–34.0)
MCHC: 35.2 g/dL (ref 30.0–36.0)
MCV: 88.6 fL (ref 80.0–100.0)
MONO ABS: 0.7 10*3/uL (ref 0.1–1.0)
Monocytes Relative: 6 %
Neutro Abs: 8.2 10*3/uL — ABNORMAL HIGH (ref 1.7–7.7)
Neutrophils Relative %: 67 %
Platelets: 308 10*3/uL (ref 150–400)
RBC: 5.42 MIL/uL (ref 4.22–5.81)
RDW: 13 % (ref 11.5–15.5)
WBC: 12.4 10*3/uL — AB (ref 4.0–10.5)
nRBC: 0 % (ref 0.0–0.2)

## 2018-10-15 LAB — COMPREHENSIVE METABOLIC PANEL
ALT: 40 U/L (ref 0–44)
AST: 34 U/L (ref 15–41)
Albumin: 3.9 g/dL (ref 3.5–5.0)
Alkaline Phosphatase: 134 U/L — ABNORMAL HIGH (ref 38–126)
Anion gap: 10 (ref 5–15)
BUN: 8 mg/dL (ref 6–20)
CALCIUM: 9.4 mg/dL (ref 8.9–10.3)
CO2: 21 mmol/L — ABNORMAL LOW (ref 22–32)
CREATININE: 1.1 mg/dL (ref 0.61–1.24)
Chloride: 107 mmol/L (ref 98–111)
GFR calc non Af Amer: >60 mL/min (ref >60)
Glucose, Bld: 134 mg/dL — ABNORMAL HIGH (ref 70–99)
Potassium: 3.8 mmol/L (ref 3.5–5.1)
Sodium: 138 mmol/L (ref 135–145)
Total Bilirubin: 0.9 mg/dL (ref 0.3–1.2)
Total Protein: 7.6 g/dL (ref 6.5–8.1)

## 2018-10-15 MED ORDER — LORAZEPAM 2 MG/ML IJ SOLN
INTRAMUSCULAR | Status: AC
  Administered 2018-10-15: 2 mg via INTRAVENOUS
  Filled 2018-10-15: qty 1

## 2018-10-15 MED ORDER — LEVETIRACETAM IN NACL 500 MG/100ML IV SOLN
500.0000 mg | Freq: Once | INTRAVENOUS | Status: AC
  Administered 2018-10-15: 500 mg via INTRAVENOUS
  Filled 2018-10-15: qty 100

## 2018-10-15 MED ORDER — LEVETIRACETAM IN NACL 1000 MG/100ML IV SOLN
1000.0000 mg | Freq: Once | INTRAVENOUS | Status: AC
  Administered 2018-10-15: 1000 mg via INTRAVENOUS
  Filled 2018-10-15: qty 100

## 2018-10-15 MED ORDER — LORAZEPAM 2 MG/ML IJ SOLN
2.0000 mg | Freq: Once | INTRAMUSCULAR | Status: AC
  Administered 2018-10-15: 2 mg via INTRAVENOUS

## 2018-10-15 MED ORDER — SODIUM CHLORIDE 0.9 % IV BOLUS
1000.0000 mL | Freq: Once | INTRAVENOUS | Status: AC
  Administered 2018-10-15: 1000 mL via INTRAVENOUS

## 2018-10-15 MED ORDER — LEVETIRACETAM 750 MG PO TABS: 750.0000 mg | ORAL_TABLET | Freq: Two times a day (BID) | ORAL | 0 refills | Status: DC

## 2018-10-15 NOTE — ED Triage Notes (Signed)
Pt had a seizure this AM lasting approx 2-77min with incontinence, no facial trauma, no hx of seizures. Did have a stroke with left MCA infarct in July 2019.  Non-verbal but responsive

## 2018-10-15 NOTE — Consult Note (Signed)
Neurology Consultation  Reason for Consult:  Referring Physician: Dr. Tomi Bamberger  CC: Seizure  History is obtained from: Chart review, Parents at the bedside    HPI: Xavier Villegas is a 38 y.o. male with a hx of large territory(entire) left MCA territory and hemorrhagic conversion, Left middle cerebral artery is occluded including the left M1 M2 and M3 segments  02/2018 midline shift followed by decompressive craniectomy, development of encephalomalacia displayed on CT head a neck 05/2015. Baseline expressive aphasia presents, right hemianopia to the ER with seizure activity.     His parents are at the bedside and are able confirm the events this morning.  They state that they noticed this morning him raise his arms, began a focal stare, and shortly after started jerking of his arms and legs. They describe a tonic clonic seizure. They describe his eyes with right gaze deviation, resp heavy intense breathing and his was non-responsive. They states that this went on for about 4-5 min. He did have loss off bladder. There was no tongue biting. His brother was called and he was transferred by private transfer to [facility] since was the preferential hospital. This transfer took about 15 min. Apparently he was awake at this point even pointing directions. Once he arrived at the ER his appeared ok until about 30-45 min later when he lad another seizure with bladder loss. He was given 2 mg Ativan which cause cessation of his seizure. Per notation his oxygen dropped to 84% requiring non-rebreather for recovery. He is ordered a load of Keppra 1500 mg IV. Labs revealed mild leukocytosis WBC 12.4, normal CR 1.10, slightly elevated alkaline Phos 134, urine is pending. CT pending completion.  He has no seizure hx to this point, nor childhood or family with hx of seizures.NIHSS is 12 with his baseline deficits.    He is slightly lethargic during his assessment but awake long enough to complete a full assessment.     ROS: A 14 point ROS was performed and is negative except as noted in the HPI.   Past Medical History:  Diagnosis Date  . Aphasia   . GERD (gastroesophageal reflux disease)   . Hemiparesis ([place]) 02/2018   right side  . Hyperhomocysteinemia ([place])   . Neurogenic bladder 02/2018   04/26/18 inporving  . Spastic hemiparesis (HCC)    right side  . Stroke ([place])    Left MCA infart  . Tobacco abuse     4-Needs assistance to walk and tend to bodily needs Essential (primary) hypertension   Family History  Problem Relation Age of Onset  . Stroke Maternal Grandmother   . Liver cancer Maternal Grandmother   . Stroke Maternal Grandfather   . Leukemia Mother        CLL/Dr. Leretha Pol at [facility]  . Diabetes Father   . High blood pressure Father   . Liver cancer Paternal Grandmother   . Cerebral aneurysm Paternal Grandfather   . Stroke Paternal Grandfather     Social History:   reports that he quit smoking about 7 months ago. His smoking use included cigarettes. He has a 4.00 pack-year smoking history. He has never used smokeless tobacco. He reports previous alcohol use of about 14.0 standard drinks of alcohol per week. He reports that he does not use drugs.  Medications  Current Facility-Administered Medications:  .  levETIRAcetam (KEPPRA) IVPB 500 mg/100 mL premix, 500 mg, Intravenous, Once, Soto, Johana, PA-C  Current Outpatient Medications:  .  aspirin 325 MG tablet,  Take 1 tablet (325 mg total) by mouth daily., Disp: , Rfl:  .  baclofen (LIORESAL) 10 MG tablet, Take 1 tablet (10 mg total) by mouth 4 (four) times daily., Disp: 120 each, Rfl: 3 .  cholecalciferol (VITAMIN D3) 25 MCG (1000 UT) tablet, Take 1,000 Units by mouth daily., Disp: , Rfl:  .  cyanocobalamin 1000 MCG tablet, Take 1 tablet (1,000 mcg total) by mouth daily., Disp: 30 tablet, Rfl: 1 .  folic acid (FOLVITE) 1 MG tablet, TAKE 4 TABLETS(4 MG) BY MOUTH DAILY (Patient taking differently: Take 1 mg by mouth 4 (four)  times daily. ), Disp: 120 tablet, Rfl: 1 .  OVER THE COUNTER MEDICATION, Take 2 capsules by mouth See admin instructions. Psyllium fiber 2 capsules by mouth everyday, Disp: , Rfl:  .  pantoprazole (PROTONIX) 40 MG tablet, Take 40 mg by mouth daily as needed (for GERD). , Disp: , Rfl:  .  polyethylene glycol (MIRALAX / GLYCOLAX) packet, Take 17 g by mouth daily as needed for mild constipation. , Disp: , Rfl:  .  pyridOXINE (B-6) 50 MG tablet, Take 1 tablet (50 mg total) by mouth daily., Disp: 30 tablet, Rfl: 1   Exam: Current vital signs: BP (!) 152/80 (BP Location: Left Arm)   Pulse (!) 132   Temp 97.7 F (36.5 C) (Oral)   Resp 16   Ht 5\' 9"  (1.753 m)   Wt 86.2 kg   SpO2 94%   BMI 28.06 kg/m  Vital signs in last 24 hours: Temp:  [97.7 F (36.5 C)] 97.7 F (36.5 C) (03/07 1041) Pulse Rate:  [116-132] 132 (03/07 1106) Resp:  [16-22] 16 (03/07 1106) BP: (116-152)/(74-80) 152/80 (03/07 1106) SpO2:  [94 %-96 %] 94 % (03/07 1106) Weight:  [86.2 kg] 86.2 kg (03/07 1037)  GENERAL:Lethargic initially stayed awake for a sustained time  HEENT: - Normocephalic and atraumatic, dry mm, no LN++, no Thyromegally LUNGS - Clear to auscultation bilaterally with no wheezes CV - S1S2 RRR, no m/r/g, equal pulses bilaterally. ABDOMEN - Soft, nontender, nondistended with normoactive BS Ext: warm, well perfused, intact peripheral pulses, 0 edema  NEURO:  Mental Status: Lethargic easily awaken, falls back asleep after assessment Language: speech is severe expressive asphasia/ baseline  Mild receptive component with mimicking required at times - also recently received Ativan  Cranial Nerves: PERRL 2 mm/brisk. EOMI, visual fields full, facial asymmetry below Nasolabial fold,  facial sensation intact, hearing intact, tongue/uvula/soft palate midline,   No evidence of tongue atrophy or fibrillations Motor: right upper ext arm spasticity, RLE 4/5 mild drift, LUE 5/5, LLE 5/5   Tone: is right side with  spacticity and bulk is normal  Sensation- Intact to light touch bilaterally Coordination: FTN intact L dysmetria  Gait- deferred   Labs I have reviewed labs in epic and the results pertinent to this consultation are: WBC 12.4 mild leukocytosis Co2 21  CBC    Component Value Date/Time   WBC 12.4 (H) 10/15/2018 1103   RBC 5.42 10/15/2018 1103   HGB 16.9 10/15/2018 1103   HCT 48.0 10/15/2018 1103   PLT 308 10/15/2018 1103   MCV 88.6 10/15/2018 1103   MCH 31.2 10/15/2018 1103   MCHC 35.2 10/15/2018 1103   RDW 13.0 10/15/2018 1103   LYMPHSABS 3.2 10/15/2018 1103   MONOABS 0.7 10/15/2018 1103   EOSABS 0.2 10/15/2018 1103   BASOSABS 0.1 10/15/2018 1103    CMP     Component Value Date/Time   NA 138 10/15/2018 1103  K 3.8 10/15/2018 1103   CL 107 10/15/2018 1103   CO2 21 (L) 10/15/2018 1103   GLUCOSE 134 (H) 10/15/2018 1103   BUN 8 10/15/2018 1103   CREATININE 1.10 10/15/2018 1103   CALCIUM 9.4 10/15/2018 1103   PROT 7.6 10/15/2018 1103   ALBUMIN 3.9 10/15/2018 1103   AST 34 10/15/2018 1103   ALT 40 10/15/2018 1103   ALKPHOS 134 (H) 10/15/2018 1103   BILITOT 0.9 10/15/2018 1103   GFRNONAA >60 10/15/2018 1103   GFRAA >60 10/15/2018 1103    Lipid Panel     Component Value Date/Time   CHOL 101 02/20/2018 0547   TRIG 94 02/22/2018 1745   HDL 34 (L) 02/20/2018 0547   CHOLHDL 3.0 02/20/2018 0547   VLDL 21 02/20/2018 0547   LDLCALC 46 02/20/2018 0547     Imaging I have reviewed the images obtained:  DG chest  Low lung volumes without focal chest disease.  CT-scan of the brain- pending   Assessment:  Xavier Villegas is a 38 y.o. male with a hx of large territory(entire) left MCA territory and hemorrhagic conversion, Left middle cerebral artery is occluded including the left M1 M2 and M3 segments  02/2018 midline shift followed by decompressive craniectomy, development of encephalomalacia displayed on CT head a neck 05/2015. Baseline expressive aphasia  presents, right hemianopia to the ER with seizure activity.   Impression: Seizure described as ( tonic clonic seizure), loss of bladder, unresponsive behavior likely post ictal state. Seizure  likely secondary to large stroke hx but infection needs to be ruled out as it can lower a seizure threshold   Recommendations: -Loaded with Keppra 1500 mg - Keppra 750 mg po BID and follow up outpatient with neurologist  - Seizure precautions   -CT should be ok to complete now- pending then can likely be d/c'd if he continues to do well.  -Maintain good blood pressure control   Spoke to his parents and let them know that he cannot drive. His father states that he thought about taking him in a parking lot and allowing his to drive since he did so well, but I explained that this plan should be held off. According to the state of [place] he cannot drive for 6 months or unless he is cleared by a neurologist. Although we do not report to the [facility] we expect under good faith patients abide this rule to prevent hurting themselves or others. His parents agree to this discussion and understand the risk.   Lilyan Gilford neuro-hospitalist [place] NP   NEUROHOSPITALIST ADDENDUM Performed a face to face diagnostic evaluation.   I have reviewed the contents of history and physical exam as documented by PA/ARNP/Resident and agree with above documentation.  I have discussed and formulated the above plan as documented. Edits to the note have been made as needed.  11 y with Left MCA stroke with hemorrhagic conversion s/p decompressive hemicraniectomy presents with 2 seizures. Patient had first seizure at home (tonic clonic ) with return to baseline and had another seizure on arrival to ED.  Was given 2mg  of ativan and neurology called. Recommended loading with 1.5 g of Keppra. Monitored for 4hours in ED- no further seizures and back to baseline. No fever, electrolytes within normal limits. Mild leukocytosis likely due to  margination from seizures. UA pending  However patient denies burning sensation/urinary frequency. No other sequelae to suggest infection. CT head shows large area of encephalomalacia in left hemisphere as expected. No acute hemorrhage seen.  Seizure likely due to encephalomalacia- recommend patient be on Keppra 750 mg BID.  Seizure precautions. Back to baseline - expressive aphasia>>than receptive aphasia with residual right hemiparesis.   Needs outpatient neurology follow up with his neurologist Dr Leonie Man.     Karena Addison  MD [facility] [phone]   If 7pm to 7am, please call on call as listed on AMION.

## 2018-10-15 NOTE — Discharge Instructions (Addendum)
All your laboratory results were within normal limits today.  Your CT head wall.  I have written a prescription for medication to help prevent seizures. Take one tablet twice a day for the next 15 days. Please schedule an appointment to follow up with neurology.

## 2018-10-15 NOTE — ED Notes (Signed)
Seizure pads placed

## 2018-10-15 NOTE — ED Provider Notes (Signed)
[facility] Provider Note   CSN: [id number] Arrival date & time: 10/15/18  1029    History   Chief Complaint Chief Complaint  Patient presents with  . Seizures    HPI Xavier Villegas is a 38 y.o. male.     38 y.o male with a PMH of CVA (left MCA), GERD, prior neurogenic bladder presents to the ED brought in by parents s/p witnessed seizure. Mother reports he was watching TV dosing off when he began moving his right arm, sticking his tongue out and colvusing for approximately 2-3 minutes along with incontinence episode per mother.  Mother states he was back to baseline after the episode but was very sleepy.  Patient has a right-sided deficit, according to father at the bedside states patient is ambulatory on his own but has weakness on the right upper limb along with difficulty with speech currently on speech therapy.  Father reports his neurogenic bladder had resolved as patient uses the bathroom independently.  Father reports the only changes this past week have been patient was seen at the dentist yesterday for multiple cavity fillings, he also has a new right buttocks abscess that father has been monitoring.  Parents deny any fever, coughs, changes in medication or trauma.       Past Medical History:  Diagnosis Date  . Aphasia   . GERD (gastroesophageal reflux disease)   . Hemiparesis ([place]) 02/2018   right side  . Hyperhomocysteinemia ([place])   . Neurogenic bladder 02/2018   04/26/18 inporving  . Spastic hemiparesis (HCC)    right side  . Stroke ([place])    Left MCA infart  . Tobacco abuse     Patient Active Problem List   Diagnosis Date Noted  . CVA (cerebral vascular accident) ([place]) 05/02/2018  . Neurogenic bowel   . Neurogenic bladder   . Spastic hemiparesis ([place])   . 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 Cerebral infarction due to embolism of left middle  cerebral artery (HCC) s/p thrombectomy 03/01/2018  . Carotid artery dissection ([place]) Left 03/01/2018  . Acute ischemic right MCA stroke ([place]) 03/01/2018  . Right hemiparesis ([place])   . Dysphagia, post-stroke   . Global aphasia   . Leukocytosis   . Folic acid deficiency 74/25/9563  . B12 deficiency 02/23/2018  . Hyperhomocysteinemia ([place]) 02/23/2018  . Smoker     Past Surgical History:  Procedure Laterality Date  . CRANIOTOMY Left 02/21/2018   Procedure: DECOMPRESSIVE CRANIECTOMY with bone flap to abdomen;  Surgeon: [name], MD;  Location: [facility];  Service: Neurosurgery;  Laterality: Left;  DECOMPRESSIVE CRANIECTOMY with bone flap to abdomen  . CRANIOTOMY N/A 05/02/2018   Procedure: RE-IMPLANTATION OF CRANIAL FLAP;  Surgeon: [name], MD;  Location: [facility];  Service: Neurosurgery;  Laterality: N/A;  RE-IMPLANTATION OF CRANIAL FLAP  . IR ANGIO INTRA EXTRACRAN SEL COM CAROTID INNOMINATE UNI R MOD SED  02/19/2018  . IR ANGIO VERTEBRAL SEL VERTEBRAL BILAT MOD SED  02/19/2018  . IR CT HEAD LTD  02/19/2018  . IR PERCUTANEOUS ART THROMBECTOMY/INFUSION INTRACRANIAL INC DIAG ANGIO  02/19/2018  . IR US GUIDE VASC ACCESS RIGHT  02/19/2018  . RADIOLOGY WITH ANESTHESIA N/A 02/19/2018   Procedure: IR WITH ANESTHESIA CODE STROKE;  Surgeon: Corrie Mckusick, DO;  Location: [facility];  Service: Anesthesiology;  Laterality: N/A;  . TEE WITHOUT CARDIOVERSION N/A 03/01/2018   Procedure: TRANSESOPHAGEAL ECHOCARDIOGRAM (TEE);  Surgeon: Sanda Klein,  MD;  Location: [facility];  Service: Cardiovascular;  Laterality: N/A;        Home Medications    Prior to Admission medications   Medication Sig Start Date End Date Taking? Authorizing Provider  aspirin 325 MG tablet Take 1 tablet (325 mg total) by mouth daily. 03/02/18  Yes Donzetta Starch, NP  baclofen (LIORESAL) 10 MG tablet Take 1 tablet (10 mg total) by mouth 4 (four) times daily. 06/15/18  Yes Meredith Staggers, MD  cholecalciferol (VITAMIN D3) 25 MCG (1000  UT) tablet Take 1,000 Units by mouth daily.   Yes [provider]  cyanocobalamin 1000 MCG tablet Take 1 tablet (1,000 mcg total) by mouth daily. 05/10/18  Yes Meredith Staggers, MD  folic acid (FOLVITE) 1 MG tablet TAKE 4 TABLETS(4 MG) BY MOUTH DAILY Patient taking differently: Take 1 mg by mouth 4 (four) times daily.  07/11/18  Yes Meredith Staggers, MD  OVER THE COUNTER MEDICATION Take 2 capsules by mouth See admin instructions. Psyllium fiber 2 capsules by mouth everyday   Yes [provider]  pantoprazole (PROTONIX) 40 MG tablet Take 40 mg by mouth daily as needed (for GERD).    Yes [provider]  polyethylene glycol (MIRALAX / GLYCOLAX) packet Take 17 g by mouth daily as needed for mild constipation.    Yes [provider]  pyridOXINE (B-6) 50 MG tablet Take 1 tablet (50 mg total) by mouth daily. 05/10/18  Yes Meredith Staggers, MD  levETIRAcetam (KEPPRA) 750 MG tablet Take 1 tablet (750 mg total) by mouth 2 (two) times daily for 15 days. 10/15/18 10/30/18  Janeece Fitting, PA-C    Family History Family History  Problem Relation Age of Onset  . Stroke Maternal Grandmother   . Liver cancer Maternal Grandmother   . Stroke Maternal Grandfather   . Leukemia Mother        CLL/[facility]. Leretha [facility] at [facility]  . Diabetes Father   . High blood pressure Father   . Liver cancer Paternal Grandmother   . Cerebral aneurysm Paternal Grandfather   . Stroke Paternal Grandfather     Social History Social History   Tobacco Use  . Smoking status: Former Smoker    Packs/day: 0.50    Years: 8.00    Pack years: 4.00    Types: Cigarettes    Last attempt to quit: 02/19/2018    Years since quitting: 0.6  . Smokeless tobacco: Never Used  Substance Use Topics  . Alcohol use: Not Currently    Alcohol/week: 14.0 standard drinks    Types: 14 Cans of beer per week  . Drug use: Never     Allergies   Patient has no known allergies.   Review of Systems Review of Systems    Constitutional: Negative for chills and fever.  HENT: Negative for ear pain and sore throat.   Eyes: Negative for pain and visual disturbance.  Respiratory: Negative for cough and shortness of breath.   Cardiovascular: Negative for chest pain and palpitations.  Gastrointestinal: Negative for abdominal pain and vomiting.  Genitourinary: Negative for dysuria and hematuria.  Musculoskeletal: Negative for arthralgias and back pain.  Skin: Positive for wound. Negative for color change and rash.  Neurological: Positive for speech difficulty and weakness. Negative for seizures, syncope, facial asymmetry and headaches.  All other systems reviewed and are negative.    Physical Exam Updated Vital Signs BP 134/86   Pulse 91   Temp 97.7 F (36.5 C) (Oral)  Resp 14   Ht 5\' 9"  (1.753 m)   Wt 86.2 kg   SpO2 97%   BMI 28.06 kg/m   Physical Exam Vitals signs and nursing note reviewed.  Constitutional:      Appearance: He is well-developed.  HENT:     Head: Normocephalic and atraumatic.  Eyes:     General: No scleral icterus.    Pupils: Pupils are equal, round, and reactive to light.  Neck:     Musculoskeletal: Normal range of motion.  Cardiovascular:     Rate and Rhythm: Regular rhythm. Tachycardia present.     Heart sounds: Normal heart sounds.  Pulmonary:     Effort: Pulmonary effort is normal.     Breath sounds: Normal breath sounds. No wheezing.  Chest:     Chest wall: No tenderness.  Abdominal:     General: Bowel sounds are normal. There is no distension.     Palpations: Abdomen is soft.     Tenderness: There is no abdominal tenderness.  Musculoskeletal:        General: No tenderness or deformity.  Skin:    General: Skin is warm and dry.       Neurological:     Mental Status: He is alert. Mental status is at baseline.     Cranial Nerves: No facial asymmetry.     Motor: Weakness and seizure activity present.     Comments: Right-sided deficit at baseline, unable to  move right upper extremity.  Right lower extremity able to dorsiflex and plantarflex, unable to wiggle toes.  Per parents patient ambulates on his own without assistance.  Patient is nonverbal.  No facial asymmetry on exam.      ED Treatments / Results  Labs (all labs ordered are listed, but only abnormal results are displayed) Labs Reviewed  CBC WITH DIFFERENTIAL/PLATELET - Abnormal; Notable for the following components:      Result Value   WBC 12.4 (*)    Neutro Abs 8.2 (*)    All other components within normal limits  COMPREHENSIVE METABOLIC PANEL - Abnormal; Notable for the following components:   CO2 21 (*)    Glucose, Bld 134 (*)    Alkaline Phosphatase 134 (*)    All other components within normal limits  CBG MONITORING, ED - Abnormal; Notable for the following components:   Glucose-Capillary 143 (*)    All other components within normal limits  URINALYSIS, ROUTINE W REFLEX MICROSCOPIC  RAPID URINE DRUG SCREEN, HOSP PERFORMED    EKG EKG Interpretation  Date/Time:  Saturday October 15 2018 10:38:43 EST Ventricular Rate:  112 PR Interval:    QRS Duration: 103 QT Interval:  342 QTC Calculation: 467 R Axis:   69 Text Interpretation:  Sinus tachycardia Abnormal inferior Q waves Since last tracing rate faster Confirmed by Dorie Rank [id number]) on 10/15/2018 10:41:35 AM   Radiology Ct Head Wo Contrast  Result Date: 10/15/2018 CLINICAL DATA:  Seizure this morning. No facial trauma reported. History left MCA infarct. EXAM: CT HEAD WITHOUT CONTRAST TECHNIQUE: Contiguous axial images were obtained from the base of the skull through the vertex without intravenous contrast. COMPARISON:  05/25/2018 head CT. FINDINGS: Brain: Stable chronic large region of encephalomalacia in the left MCA territory with ex vacuo dilatation of the left lateral ventricle. Chronic small left frontal extra-axial collection deep to left craniotomy measuring up to 3 mm thickness, decreased from 5 mm on 05/25/2018  CT. No evidence of parenchymal hemorrhage or new extra-axial fluid collection. No  mass lesion, mass effect, or midline shift. No CT evidence of acute infarction. No acute ventriculomegaly. Vascular: No acute abnormality. Skull: No evidence of calvarial fracture. Stable postsurgical changes from left frontoparietal craniotomy. Sinuses/Orbits: The visualized paranasal sinuses are essentially clear. Other:  The mastoid air cells are unopacified. IMPRESSION: 1. No evidence of acute intracranial abnormality. No evidence of calvarial fracture. 2. Stable chronic large region of encephalomalacia in the left MCA territory. Small chronic left frontal extra-axial collection deep to the left craniotomy is decreased in size in the interval. Electronically Signed   By: Ilona Sorrel M.D.   On: 10/15/2018 14:02   Dg Chest Port 1 View  Result Date: 10/15/2018 CLINICAL DATA:  Cough.  Seizure this morning. EXAM: PORTABLE CHEST 1 VIEW COMPARISON:  04/25/2018 FINDINGS: Low lung volumes with crowding of the central vasculature. No focal airspace disease. Heart and mediastinum are within normal limits. Trachea is midline. Negative for a pneumothorax. Bony thorax is grossly intact. IMPRESSION: Low lung volumes without focal chest disease. Electronically Signed   By: Markus Daft M.D.   On: 10/15/2018 12:06    Procedures Procedures (including critical care time)  Medications Ordered in ED Medications  LORazepam (ATIVAN) injection 2 mg (2 mg Intravenous Given by Other 10/15/18 1111)  levETIRAcetam (KEPPRA) IVPB 1000 mg/100 mL premix (0 mg Intravenous Stopped 10/15/18 1132)  sodium chloride 0.9 % bolus 1,000 mL (0 mLs Intravenous Stopped 10/15/18 1308)  levETIRAcetam (KEPPRA) IVPB 500 mg/100 mL premix (0 mg Intravenous Stopped 10/15/18 1212)     Initial Impression / Assessment and Plan / ED Course  I have reviewed the triage vital signs and the nursing notes.  Pertinent labs & imaging results that were available during my care of  the patient were reviewed by me and considered in my medical decision making (see chart for details).      Patient with a previous history of CVA, along with cranioplasty presents to the ED with new onset seizures.  Seizure was witnessed by mother, patient return back to baseline seen in the ED.  No obvious sources of infection during evaluation parents deny any fever, coughs, changes in his medications.  Will obtain screening labs along with CT head as this is patient's first seizure.  Was called to the room as patient had a second seizure which lasted approximately less than 2 minutes, 2 of Ativan was given which stopped seizure patient desatted around 84 placed on a nonrebreather, pupils are reactive.  Neurology call will be placed for their recommendations.  11:53 AM Dr. Lorraine Lax from neurology was consulted due to patient's previous neurological history along with second episode of seizures, he recommended to add 500 mg for Keppra dosing, watch patient for 4 hours to monitor back to baseline.  He will evaluate patient in the ED as well.  3:29 PM Dr. Lorraine Lax evaluate patient in the ED. We feel patient is non-toxic, well appearing has no urinary symptoms at this time. He does have condom catheter in place, has not urinated in the past 5 hours that he is been here.  Provided with fluids. Patient has been seizure free in the ED since 10:30, provided with Keppra load.  I have written a prescription for Keppra 750 mg twice daily per neurology recommendation.  To follow-up outpatient neurology Dr. Mamie [name] as he is a current patient with them last vitis 08/24/2018.   UA will discontinue at this time.  Patient provided with Keppra medication prescription for home.  Parents understand and agree with  management.  Return precautions provided at length. Final Clinical Impressions(s) / ED Diagnoses   Final diagnoses:  New onset seizure [facility])    ED Discharge Orders         Ordered    levETIRAcetam (KEPPRA) 750  MG tablet  2 times daily     10/15/18 1446           Janeece Fitting, PA-C 10/15/18 1534    Dorie Rank, MD 10/16/18 1129

## 2018-10-15 NOTE — ED Notes (Signed)
Suction at bedside.

## 2018-10-15 NOTE — ED Notes (Signed)
CBG collected. Result "143." RN, Emmy, notified.

## 2018-10-15 NOTE — ED Notes (Addendum)
Pt had seizure lasting approx 2 minutes with incontinence no facial trauma. EDPA to bedside. Verbal order given for 2 ativan, given, which stopped the seizure. Pt desaturated to 84%, placed on nasal canula then non-rebreather. Pupils reactive, no change from initial assessment. Resting now in NAD.

## 2018-10-17 DIAGNOSIS — R569 Unspecified convulsions: Secondary | ICD-10-CM | POA: Diagnosis not present

## 2018-10-18 ENCOUNTER — Encounter
Payer: BLUE CROSS/BLUE SHIELD | Attending: Physical Medicine & Rehabilitation | Admitting: Physical Medicine & Rehabilitation

## 2018-10-18 ENCOUNTER — Encounter: Payer: Self-pay | Admitting: Physical Medicine & Rehabilitation

## 2018-10-18 VITALS — BP 123/85 | HR 66 | Ht 66.0 in | Wt 221.0 lb

## 2018-10-18 DIAGNOSIS — G8110 Spastic hemiplegia affecting unspecified side: Secondary | ICD-10-CM

## 2018-10-18 DIAGNOSIS — Z9889 Other specified postprocedural states: Secondary | ICD-10-CM | POA: Diagnosis not present

## 2018-10-18 DIAGNOSIS — E539 Vitamin B deficiency, unspecified: Secondary | ICD-10-CM | POA: Diagnosis not present

## 2018-10-18 DIAGNOSIS — I69320 Aphasia following cerebral infarction: Secondary | ICD-10-CM | POA: Diagnosis not present

## 2018-10-18 DIAGNOSIS — Z87891 Personal history of nicotine dependence: Secondary | ICD-10-CM | POA: Diagnosis not present

## 2018-10-18 DIAGNOSIS — N319 Neuromuscular dysfunction of bladder, unspecified: Secondary | ICD-10-CM | POA: Diagnosis not present

## 2018-10-18 DIAGNOSIS — K592 Neurogenic bowel, not elsewhere classified: Secondary | ICD-10-CM | POA: Diagnosis not present

## 2018-10-18 DIAGNOSIS — I69391 Dysphagia following cerebral infarction: Secondary | ICD-10-CM | POA: Diagnosis not present

## 2018-10-18 DIAGNOSIS — I69351 Hemiplegia and hemiparesis following cerebral infarction affecting right dominant side: Secondary | ICD-10-CM | POA: Insufficient documentation

## 2018-10-18 MED ORDER — BACLOFEN 10 MG PO TABS: 10.0000 mg | ORAL_TABLET | Freq: Four times a day (QID) | ORAL | 3 refills | Status: DC

## 2018-10-18 MED ORDER — FOLIC ACID 1 MG PO TABS: 1.0000 mg | ORAL_TABLET | Freq: Four times a day (QID) | ORAL | 4 refills | Status: DC

## 2018-10-18 NOTE — Progress Notes (Signed)
Botox Injection for spasticity using needle EMG guidance Indication: Spastic hemiparesis (HCC)  Vitamin B deficiency - Plan: folic acid (FOLVITE) 1 MG tablet, baclofen (LIORESAL) 10 MG tablet   Dilution: 100 Units/ml        Total Units Injected: 300 Indication: Severe spasticity which interferes with ADL,mobility and/or  hygiene and is unresponsive to medication management and other conservative care Informed consent was obtained after describing risks and benefits of the procedure with the patient. This includes bleeding, bruising, infection, excessive weakness, or medication side effects. A REMS form is on file and signed.  Needle: 57mm injectable monopolar needle electrode  Number of units per muscle Pectoralis Major 0 units Pectoralis Minor 0 units Biceps 0 units Brachioradialis 0 units FCR 25 units FCU 25 units FDS 100 units FDP 100 units FPL 0 units Pronator Teres 50 units Pronator Quadratus 0 units  All injections were done after obtaining appropriate EMG activity and after negative drawback for blood. The patient tolerated the procedure well. Post procedure instructions were given. Return in about 2 months (around 12/18/2018).

## 2018-10-18 NOTE — Patient Instructions (Signed)
PLEASE FEEL FREE TO CALL OUR OFFICE WITH ANY PROBLEMS OR QUESTIONS ([phone])      

## 2018-10-20 ENCOUNTER — Ambulatory Visit: Payer: BLUE CROSS/BLUE SHIELD | Admitting: Physical Therapy

## 2018-10-20 ENCOUNTER — Telehealth: Payer: Self-pay | Admitting: Adult Health

## 2018-10-20 ENCOUNTER — Encounter: Payer: BLUE CROSS/BLUE SHIELD | Admitting: Occupational Therapy

## 2018-10-20 NOTE — Telephone Encounter (Signed)
LVM for pt's father Chrissie Noa to call back to schedule appt with Janett Billow per Katrina.

## 2018-10-20 NOTE — Telephone Encounter (Signed)
Patient is scheduled for March 17 at 7:45

## 2018-10-20 NOTE — Telephone Encounter (Signed)
Pt's father said he had 2 seizures on Saturday. He was taken to [facility] ED. Pt's father is wanting to know does he follow up at the clinic or with PCP? Please call to advise

## 2018-10-21 ENCOUNTER — Telehealth: Payer: Self-pay | Admitting: Physical Medicine & Rehabilitation

## 2018-10-21 NOTE — Telephone Encounter (Signed)
Letter written

## 2018-10-21 NOTE — Telephone Encounter (Signed)
Yes.  This is fine.

## 2018-10-21 NOTE — Telephone Encounter (Signed)
Xavier Villegas is requesting a letter stating Xavier Villegas such as he can't speak or use right arm. He is requesting this because social security needs it for his disability case.

## 2018-10-24 NOTE — Progress Notes (Signed)
[facility] [address] [place]. [place] [address] [phone]       OFFICE FOLLOW UP NOTE  [name] Date of Birth:  1980-11-09 Medical Record Number:  [id number]   Reason for Referral:  hospital stroke follow up  CHIEF COMPLAINT:  Chief Complaint  Patient presents with   Follow-up    Patient had new onset of seizure pt had a stroke in the past room in back hallway  pt had one seizure at home and in hospital pt with father Xavier Villegas    HPI:  10/25/18 VISIT [name] is a 38 year old male who is being seen today for follow-up visit after admission to ED on 10/15/2018 after witnessed seizure activity with history of left MCA infarct with small ICH/SDH hemorrhagic conversion s/p craniotomy.  Per ED notes, consisted of "moving his right arm, sticking his tongue out and convulsing for approximately 2 to 3 minutes along with incontinence episode per mother".  Reported postictal state per mother.  He ultimately had recurrent seizure during admission lasting for less than 2 minutes.  CT head reviewed and was negative for acute intracranial abnormality and showed stable chronic large region of encephalomalacia in the left MCA territory with small chronic left frontal extra-axial collection deep to the left craniotomy. initiated Keppra 750 mg twice daily at discharge for seizure prophylaxis.  He denies any recurrent seizure activity since this time.  He has tolerated Keppra without reported side effects.  He continues to have residual stroke deficits of right spastic hemiparesis, severe expressive aphasia and right homonymous hemianopia.  No further concerns at this time.  He does continue to take aspirin 325 mg without reported side effects of bleeding or bruising.  Blood pressure today satisfactory at 128/81.  He continues to live at home but is able to maintain ADLs independently.  He was previously participating at [employer] for physical and speech therapy  as his insurance will not approve additional therapy sessions at this time.  Unfortunately, due to recent pandemic, these colleges have close at this time but he plans on restarting once able.  He returns today for reevaluation and follow-up regarding recent seizure activity.      History 08/24/2018: Patient is being seen today for 29-month follow-up visit and is accompanied by his mother.  He has been doing well since prior visit with continued post stroke deficits of right spastic hemiparesis, severe expressive aphasia and right homonymous hemianopia. He continues to work with PT/OT at our neuro rehab clinic and does endorse some improvement of his hemiparesis.  He currently is ambulating with a cane but typically will not use the cane in his home unless he is going up steps.  He is currently on baclofen to assist with spasticity and he does endorse benefit from this.  He is unable to participate in further ST sessions due to insurance reasons and plans on looking into volunteering at a speech therapy school in hopes of obtaining additional ST.  He does follow with ophthalmology for peripheral vision exams and per mother, these have been stable.  He continues on aspirin 325 mg without side effects of bleeding or bruising.  Blood pressure today satisfactory 130/85.  No further concerns at this time.  Denies new or worsening stroke/TIA symptoms.  05/17/2018 visit: Patient is being seen today for hospital follow up and is accompanied by his aunt. Underwent re-implantation of cranial flap on 05/02/18 which was tolerated well without complication.  He continues to have right hemiparesis  with spasticity and expressive aphasia but does feel as though all have been improving.  He participates at our neuro rehab clinic where he receives PT/OT/ST.  He is able to ambulate with quad cane and use of foot drop brace but denies any recent falls.  He is able to say a few words but for the most part nods head appropriately.  He  also continues to have right homonymous hemianopia and is unsure if there has been any improvement.  He was discharged from hospital stay on aspirin 325 and has continued to take this without side effects of bleeding or bruising.  Blood pressure today satisfactory at 125/80.  He currently is living with his father for continued assistance but is able to bathe and dress himself independently.  Patient appears to be in good spirits regarding recovery progress and the future.  Denies any depression-like symptoms or anxiety.  No further concerns at this time.  Denies new or worsening stroke/TIA symptoms.   03/01/2018 hospital admission: Xavier Villegas a 37 y.o.malewith no significant past medical history other than tobacco usewho presented withright-sided weakness and aphasia.Hedid not receive IV t-PA due to late presentation.  CT head reviewed and showed large territory acute infarct left MCA territory.  Due to these findings, he was taken to MRI for consideration of IR if his infarct burden appears less than estimated by CTP but unfortunately due to other patient care issues there would have been a significant delay therefore patient was taken to IR for intervention. Patient underwent attempted thrombectomy for occlusive left ICA which resulted in left ICA dissection and possible ICH.  Postprocedure CT showed subarachnoid hemorrhage with hemorrhagic transformation.  MRI head reviewed and showed large left MCA infarct with small ICH/SDH hemorrhagic conversion.  MRA head showed left ICA and MCA occlusion.  CTA head and neck showed acute dissection left internal carotid artery with intraluminal thrombus along with 2 mm midline shift.  Repeat CT showed progressive cytotoxic edema with slight mass-effect and midline shift where he required left decompressive hemicraniotomy as well as hypertonic saline for management of edema. Repeat CT scan showed improvement after treatment. Lower extremity  venous Dopplers were negative for DVT.  Carotid Doppler showed left ICA occlusion.  2D echo showed an EF of 55 to 60% with aortic valve mass versus vegetation.  TEE was normal without evidence of vegetation.  Hypercoagulable work-up did show high homocystine at 181.7 and he was started on high-dose folate and B6 as well as B12 injection.  LDL 46 and A1c 4.9.  Patient was discharged to [facility] for continuation of therapy in stable condition. Patient was discharged home on 04/06/18 with 24 hr supervision.       ROS:   14 system review of systems performed and negative with exception of seizures    PMH:  Past Medical History:  Diagnosis Date   Aphasia    GERD (gastroesophageal reflux disease)    Hemiparesis ([place]) 02/2018   right side   Hyperhomocysteinemia (HCC)    Neurogenic bladder 02/2018   04/26/18 inporving   Spastic hemiparesis (HCC)    right side   Stroke ([place])    Left MCA infart   Tobacco abuse     PSH:  Past Surgical History:  Procedure Laterality Date   CRANIOTOMY Left 02/21/2018   Procedure: DECOMPRESSIVE CRANIECTOMY with bone flap to abdomen;  Surgeon: [name], MD;  Location: [facility];  Service: Neurosurgery;  Laterality: Left;  DECOMPRESSIVE CRANIECTOMY with bone flap to abdomen  CRANIOTOMY N/A 05/02/2018   Procedure: RE-IMPLANTATION OF CRANIAL FLAP;  Surgeon: [name], MD;  Location: [facility];  Service: Neurosurgery;  Laterality: N/A;  RE-IMPLANTATION OF CRANIAL FLAP   IR ANGIO INTRA EXTRACRAN SEL COM CAROTID INNOMINATE UNI R MOD SED  02/19/2018   IR ANGIO VERTEBRAL SEL VERTEBRAL BILAT MOD SED  02/19/2018   IR CT HEAD LTD  02/19/2018   IR PERCUTANEOUS ART THROMBECTOMY/INFUSION INTRACRANIAL INC DIAG ANGIO  02/19/2018   IR US GUIDE VASC ACCESS RIGHT  02/19/2018   RADIOLOGY WITH ANESTHESIA N/A 02/19/2018   Procedure: IR WITH ANESTHESIA CODE STROKE;  Surgeon: Corrie Mckusick, DO;  Location: [facility];  Service: Anesthesiology;  Laterality: N/A;   TEE WITHOUT CARDIOVERSION  N/A 03/01/2018   Procedure: TRANSESOPHAGEAL ECHOCARDIOGRAM (TEE);  Surgeon: Sanda Klein, MD;  Location[facility] ENDOSCOPY;  Service: Cardiovascular;  Laterality: N/A;    Social History:  Social History   Socioeconomic History   Marital status: Divorced    Spouse name: Not on file   Number of children: Not on file   Years of education: Not on file   Highest education level: Not on file  Occupational History   Not on file  Social Needs   Financial resource strain: Not on file   Food insecurity:    Worry: Not on file    Inability: Not on file   Transportation needs:    Medical: Not on file    Non-medical: Not on file  Tobacco Use   Smoking status: Former Smoker    Packs/day: 0.50    Years: 8.00    Pack years: 4.00    Types: Cigarettes    Last attempt to quit: 02/19/2018    Years since quitting: 0.6   Smokeless tobacco: Never Used  Substance and Sexual Activity   Alcohol use: Not Currently    Alcohol/week: 14.0 standard drinks    Types: 14 Cans of beer per week   Drug use: Never   Sexual activity: Yes  Lifestyle   Physical activity:    Days per week: Not on file    Minutes per session: Not on file   Stress: Not on file  Relationships   Social connections:    Talks on phone: Not on file    Gets together: Not on file    Attends religious service: Not on file    Active member of club or organization: Not on file    Attends meetings of clubs or organizations: Not on file    Relationship status: Not on file   Intimate partner violence:    Fear of current or ex partner: Not on file    Emotionally abused: Not on file    Physically abused: Not on file    Forced sexual activity: Not on file  Other Topics Concern   Not on file  Social History Narrative   Not on file    Family History:  Family History  Problem Relation Age of Onset   Stroke Maternal Grandmother    Liver cancer Maternal Grandmother    Stroke Maternal Grandfather    Leukemia  Mother        CLL/Dr. Leretha Pol at [facility]   Diabetes Father    High blood pressure Father    Liver cancer Paternal Grandmother    Cerebral aneurysm Paternal Grandfather    Stroke Paternal Grandfather     Medications:   Current Outpatient Medications on File Prior to Visit  Medication Sig Dispense Refill   aspirin 325 MG tablet  Take 1 tablet (325 mg total) by mouth daily.     baclofen (LIORESAL) 10 MG tablet Take 1 tablet (10 mg total) by mouth 4 (four) times daily. 120 each 3   cholecalciferol (VITAMIN D3) 25 MCG (1000 UT) tablet Take 1,000 Units by mouth daily.     cyanocobalamin 1000 MCG tablet Take 1 tablet (1,000 mcg total) by mouth daily. 30 tablet 1   folic acid (FOLVITE) 1 MG tablet Take 1 tablet (1 mg total) by mouth 4 (four) times daily. 120 tablet 4   OVER THE COUNTER MEDICATION Take 2 capsules by mouth See admin instructions. Psyllium fiber 2 capsules by mouth everyday     pantoprazole (PROTONIX) 40 MG tablet Take 40 mg by mouth daily as needed (for GERD).      polyethylene glycol (MIRALAX / GLYCOLAX) packet Take 17 g by mouth daily as needed for mild constipation.      pyridOXINE (B-6) 50 MG tablet Take 1 tablet (50 mg total) by mouth daily. 30 tablet 1   No current facility-administered medications on file prior to visit.     Allergies:  No Known Allergies   Physical Exam  Vitals:   10/25/18 0715  BP: 128/81  Pulse: 78  Weight: 225 lb 12.8 oz (102.4 kg)  Height: 5\' 6"  (1.676 m)   Body mass index is 36.45 kg/m. No exam data present  General: well developed, well nourished, pleasant young Caucasian male, seated, in no evident distress Head: head normocephalic and atraumatic.   Neck: supple with no carotid or supraclavicular bruits Cardiovascular: regular rate and rhythm, no murmurs Musculoskeletal: no deformity Skin:  no rash/petichiae Vascular:  Normal pulses all extremities  Neurologic Exam Mental Status: Awake and fully alert.  Severe  expressive aphasia.  Nods appropriately to yes/no questions.  Oriented to place and time. Recent and remote memory intact. Attention span, concentration and fund of knowledge appropriate. Mood and affect appropriate.  Cranial Nerves: Fundoscopic exam reveals sharp disc margins. Pupils equal, briskly reactive to light. Extraocular movements full without nystagmus. Visual fields mild right homonymous hemianopia.Marland Kitchen Hearing intact.  Left-sided facial sensation decreased compared to left side.  Mild facial paralysis right side.  Motor: RUE: 0/5; RLE: 4+/5 hip flexor, 4/5 quad, weak ankle dorsiflexon with use of AFO; both with spasticity.  Full strength left upper and lower extremity Sensory.:  Decreased sensation right upper and lower extremity compared to left upper and lower extremity Coordination: Rapid alternating movements normal on left side. Finger-to-nose and heel-to-shin performed accurately on left side. Gait and Station: Arises from chair with mild difficulty. Stance is normal. Gait demonstrates hemiplegic gait with assistance of cane Reflexes: 1+ left upper and lower extremity.  2+ right upper and lower extremity.  Toes downgoing.       Diagnostic Data (Labs, Imaging, Testing)  CT HEAD WO CONTRAST IMPRESSION: 1. No evidence of acute intracranial abnormality. No evidence of calvarial fracture. 2. Stable chronic large region of encephalomalacia in the left MCA territory. Small chronic left frontal extra-axial collection deep to the left craniotomy is decreased in size in the interval.   ASSESSMENT: Xavier Villegas is a 38 y.o. year old male here with large left MCA infarct on 02/19/18 secondary to left ICA dissection s/p [id number] revascularization of L MCA M1/2 followed by decompressive craniectomy. Vascular risk factors include tobacco use, EtOH use, HTN and severe hyperhomocysteinemia.  He unfortunately has sustained recent seizure activity on 10/15/2018 likely related to prior large  left MCA infarct and was  initiated on Keppra for seizure prophylaxis.  He returns today for follow-up visit regarding seizure activity and has been stable since initiating Keppra without recurrent seizure activity or reported medication side effects.  He continues to have residual postop deficits of severe expressive aphasia, right homonymous hemianopia, and spastic right hemiparesis.      PLAN: -Continue aspirin 325 mg daily for secondary stroke prevention -Advised to continue Keppra 750 mg twice daily for seizure prophylaxis -refill placed -Order placed undergo EEG due to recent seizure activity -Recommended to undergo carotid ultrasound for follow-up of prior left ICA dissection/occlusion at prior visit but unfortunately has not been scheduled at this time -we will follow-up with referral office to assist with scheduling patient for study -based on results, possible referral to vascular surgery will be placed for further intervention -F/u with PCP regarding annual physicals and stroke risk monitoring -Restart therapies once able and advised to continue doing home exercises to prevent any worsening of current symptoms -continue to monitor BP at home -advised to continue to stay active at home along with home therapy exercises and maintain a healthy diet -Maintain strict control of hypertension with blood pressure goal below 130/90, diabetes with hemoglobin A1c goal below 6.5% and cholesterol with LDL cholesterol (bad cholesterol) goal below 70 mg/dL. I also advised the patient to eat a healthy diet with plenty of whole grains, cereals, fruits and vegetables, exercise regularly and maintain ideal body weight.  Follow up in 3 months or call earlier if needed   Greater than 50% of time during this 25 minute visit was spent on counseling,explanation of diagnosis of left MCA infarct, reviewing risk factor management of left ICA dissection, seizure activity likely related to left MCA infarct and  potential of recurrence in the future, planning of further management, discussion with patient and family and coordination of care    Venancio Poisson, [facility]  [facility] [address] [place], [place] [address]  Phone [phone] Fax [phone] Note: This document was prepared with digital dictation and possible smart phrase technology. Any transcriptional errors that result from this process are unintentional.

## 2018-10-25 ENCOUNTER — Other Ambulatory Visit: Payer: Self-pay

## 2018-10-25 ENCOUNTER — Encounter: Payer: Self-pay | Admitting: Adult Health

## 2018-10-25 ENCOUNTER — Ambulatory Visit: Payer: BLUE CROSS/BLUE SHIELD | Admitting: Adult Health

## 2018-10-25 VITALS — BP 128/81 | HR 78 | Ht 66.0 in | Wt 225.8 lb

## 2018-10-25 DIAGNOSIS — H53461 Homonymous bilateral field defects, right side: Secondary | ICD-10-CM

## 2018-10-25 DIAGNOSIS — I10 Essential (primary) hypertension: Secondary | ICD-10-CM

## 2018-10-25 DIAGNOSIS — R569 Unspecified convulsions: Secondary | ICD-10-CM

## 2018-10-25 DIAGNOSIS — I63512 Cerebral infarction due to unspecified occlusion or stenosis of left middle cerebral artery: Secondary | ICD-10-CM

## 2018-10-25 DIAGNOSIS — I69398 Other sequelae of cerebral infarction: Secondary | ICD-10-CM | POA: Diagnosis not present

## 2018-10-25 DIAGNOSIS — G8111 Spastic hemiplegia affecting right dominant side: Secondary | ICD-10-CM

## 2018-10-25 DIAGNOSIS — I7771 Dissection of carotid artery: Secondary | ICD-10-CM

## 2018-10-25 DIAGNOSIS — R4701 Aphasia: Secondary | ICD-10-CM

## 2018-10-25 MED ORDER — LEVETIRACETAM 750 MG PO TABS: 750.0000 mg | ORAL_TABLET | Freq: Two times a day (BID) | ORAL | 3 refills | Status: DC

## 2018-10-25 NOTE — Progress Notes (Signed)
I agree with the above plan 

## 2018-10-25 NOTE — Addendum Note (Signed)
Addended by: Venancio Poisson on: 10/25/2018 12:54 PM   Modules accepted: Orders

## 2018-10-25 NOTE — Patient Instructions (Addendum)
Continue to use Keppra 750 mg twice daily for seizure prophylaxis  You will be called to undergo EEG to assess for ongoing seizure activity  Continue aspirin 325 mg daily for secondary stroke prevention  Continue to follow up with PCP regarding blood pressure management   Restart therapies once able but in the meantime continue to do exercises on your own at home to prevent any worsening of symptoms  Continue to monitor blood pressure at home  Maintain strict control of hypertension with blood pressure goal below 130/90, diabetes with hemoglobin A1c goal below 6.5% and cholesterol with LDL cholesterol (bad cholesterol) goal below 70 mg/dL. I also advised the patient to eat a healthy diet with plenty of whole grains, cereals, fruits and vegetables, exercise regularly and maintain ideal body weight.  Followup in the future with me in 3 months or call earlier if needed       Thank you for coming to see [place] at [facility]. I hope we have been able to provide you high quality care today.  You may receive a patient satisfaction survey over the next few weeks. We would appreciate your feedback and comments so that we may continue to improve ourselves and the health of our patients.   Electroencephalogram, Adult An electroencephalogram (EEG) is a test that records electrical activity in the brain. It is often used to diagnose or monitor problems that are related to the brain, such as:  Seizure disorders.  Sleeping problems.  Changes in behavior. Tell a health care provider about:  Any allergies you have.  All medicines you are taking, including vitamins, herbs, eye drops, creams, and over-the-counter medicines.  Any problems you or family members have had with anesthetic medicines.  Any blood disorders you have.  Any surgeries you have had.  Any medical conditions you may have, including psychiatric conditions.  Any history of illegal drug use or alcohol  abuse. What are the risks? Generally, this is a safe test. If you have a seizure disorder, you may be made to have a seizure during the test. This is done so that your brain activity can be recorded during the seizure. What happens before the procedure?  Arrive with your hair clean and dry. Do not tease your hair, and do not put hair spray or oil in your hair.  Do not have any caffeine during the 4 hours before your test.  Follow instructions from your health care provider about sleeping, eating, or taking medicines before the test. What happens during the procedure? You will be asked to sit in a chair or lie down. Small metal discs (electrodes) will be attached to your head with an adhesive. These electrodes will pick up on the signals in your brain, and a machine will record the signals. During the test you may be asked to:  Sit or lie quietly and relax.  Open and close your eyes.  Breathe deeply for three minutes.  Look at a flashing light for a short period of time.  Go to sleep. When the test is complete, the electrodes will be removed by using a solution such as acetone or fingernail polish remover. What happens after the procedure? It is your responsibility to get your test results. Ask the lab or department performing the test when and how you will get your results. This information is not intended to replace advice given to you by your health care provider. Make sure you discuss any questions you have with your health care provider.  Document Released: 07/24/2000 Document Revised: 12/23/2015 Document Reviewed: 09/03/2014 Elsevier Interactive Patient Education  2019 [employer].    Seizure, Adult When you have a seizure:  Parts of your body may move.  You may have a change in how aware or awake (conscious) you are.  You may shake (convulse). Seizures usually last from 30 seconds to 2 minutes. Usually, they are not harmful unless they last a long time. What are the  signs or symptoms? Common symptoms of this condition include:  Shaking (convulsions).  Stiffness in the body.  Passing out (losing consciousness).  Uncontrolled movements in the: ? Arms or legs. ? Eyes. ? Head. ? Mouth. Some people have symptoms right before a seizure happens. These symptoms may include:  Fear.  Worry (anxiety).  Feeling like you are going to throw up (nausea).  Feeling like the room is spinning (vertigo).  Feeling like you saw or heard something before (dj vu).  Odd tastes or smells.  Changes in vision, such as seeing flashing lights or spots. Follow these instructions at home: Medicines   Take over-the-counter and prescription medicines only as told by your doctor.  Do not eat or drink anything that may keep your medicine from working, such as alcohol. Activity  Do not do any activities that would be dangerous if you had another seizure, like driving or swimming. Wait until your doctor says it is safe for you to do them.  If you live in the [place] ask your local DMV (department of motor vehicles) when you can drive.  Get plenty of rest. Teaching others   Teach friends and family what to do when you have a seizure. They should: ? Lay you on the ground. ? Protect your head and body. ? Loosen any tight clothing around your neck. ? Turn you on your side. ? Not hold you down. ? Not put anything into your mouth. ? Know whether or not you need emergency care. ? Stay with you until you are better. General instructions  Contact your doctor each time you have a seizure.  Avoid anything that gives you seizures.  Keep a seizure diary. Write down: ? What you think caused each seizure. ? What you remember about each seizure.  Keep all follow-up visits as told by your doctor. This is important. Contact a doctor if:  You have another seizure.  You have seizures more often.  There is any change in what happens during your seizures.  You  keep having seizures with treatment.  You have symptoms of being sick or having an infection. Get help right away if:  You have a seizure: ? That lasts longer than 5 minutes. ? That is different than seizures you had before. ? That makes it harder to breathe. ? After you hurt your head.  After a seizure, you cannot speak or use a part of your body.  After a seizure, you are confused or have a bad headache.  You have two or more seizures in a row.  You are having seizures more often.  You do not wake up right after a seizure.  You get hurt during a seizure. In an emergency:  These symptoms may be an emergency. Do not wait to see if the symptoms will go away. Get medical help right away. Call your local emergency services (911 in the [place] Do not drive yourself to the hospital. Summary  Seizures usually last from 30 seconds to 2 minutes. Usually, they are not harmful unless they  last a long time.  Do not eat or drink anything that may keep your medicine from working, such as alcohol.  Teach friends and family what to do when you have a seizure.  Contact your doctor each time you have a seizure. This information is not intended to replace advice given to you by your health care provider. Make sure you discuss any questions you have with your health care provider. Document Released: 01/13/2008 Document Revised: 04/20/2018 Document Reviewed: 09/02/2017 Elsevier Interactive Patient Education  2019 [employer].

## 2018-10-26 NOTE — Telephone Encounter (Signed)
Contacted patients father and informed letter is available for pick up

## 2018-10-27 ENCOUNTER — Ambulatory Visit (HOSPITAL_COMMUNITY)
Admission: RE | Admit: 2018-10-27 | Discharge: 2018-10-27 | Disposition: A | Discharge status: Home / Self Care | Payer: BLUE CROSS/BLUE SHIELD | Source: Ambulatory Visit | Attending: Adult Health | Admitting: Adult Health

## 2018-10-27 ENCOUNTER — Other Ambulatory Visit: Payer: Self-pay

## 2018-10-27 ENCOUNTER — Ambulatory Visit: Payer: BLUE CROSS/BLUE SHIELD

## 2018-10-27 DIAGNOSIS — R569 Unspecified convulsions: Secondary | ICD-10-CM | POA: Diagnosis not present

## 2018-10-27 DIAGNOSIS — I7771 Dissection of carotid artery: Secondary | ICD-10-CM | POA: Diagnosis not present

## 2018-10-27 NOTE — Progress Notes (Signed)
Carotid artery duplex has been completed. Preliminary results can be found in CV Proc through chart review.   10/27/18 11:24 AM Xavier Villegas RVT

## 2018-11-21 ENCOUNTER — Telehealth: Payer: Self-pay | Admitting: Adult Health

## 2018-11-21 NOTE — Telephone Encounter (Signed)
Pt is asking for a call with the results to the EEG & Ultrasound

## 2018-11-21 NOTE — Telephone Encounter (Signed)
EEG results in system and sent to his MyChart. Ultrasound results reviewed and requested family be contacted to results. Both unremarkable.

## 2018-11-21 NOTE — Telephone Encounter (Signed)
I called father and gave him the vas carotid ultrasound below. THe father verbalized understanding.    Venancio Poisson, NP Marval Regal, RN   Please advise family member (as patient is severely aphasic) that his recent carotid ultrasound showed minimal carotid stenosis and therefore no further intervention is indicated such as referral to vascular surgery. Please continue current regimen

## 2018-11-21 NOTE — Telephone Encounter (Signed)
   I called pts father on dpr. I stated per Janett Billow NP the eeg was unremarkable.The father verbalized understanding.   EEG results in system and sent to his MyChart. Ultrasound results reviewed and requested family be contacted to results. Both unremarkable. from [place] NP.

## 2018-12-21 ENCOUNTER — Other Ambulatory Visit: Payer: Self-pay

## 2018-12-21 ENCOUNTER — Encounter
Payer: BLUE CROSS/BLUE SHIELD | Attending: Physical Medicine & Rehabilitation | Admitting: Physical Medicine & Rehabilitation

## 2018-12-21 ENCOUNTER — Encounter: Payer: Self-pay | Admitting: Physical Medicine & Rehabilitation

## 2018-12-21 ENCOUNTER — Encounter: Payer: BLUE CROSS/BLUE SHIELD | Admitting: Physical Medicine & Rehabilitation

## 2018-12-21 VITALS — BP 128/80 | HR 68 | Ht 69.0 in | Wt 220.0 lb

## 2018-12-21 DIAGNOSIS — I69320 Aphasia following cerebral infarction: Secondary | ICD-10-CM | POA: Insufficient documentation

## 2018-12-21 DIAGNOSIS — I63412 Cerebral infarction due to embolism of left middle cerebral artery: Secondary | ICD-10-CM

## 2018-12-21 DIAGNOSIS — N319 Neuromuscular dysfunction of bladder, unspecified: Secondary | ICD-10-CM | POA: Insufficient documentation

## 2018-12-21 DIAGNOSIS — I69351 Hemiplegia and hemiparesis following cerebral infarction affecting right dominant side: Secondary | ICD-10-CM | POA: Insufficient documentation

## 2018-12-21 DIAGNOSIS — G8110 Spastic hemiplegia affecting unspecified side: Secondary | ICD-10-CM | POA: Diagnosis not present

## 2018-12-21 DIAGNOSIS — K592 Neurogenic bowel, not elsewhere classified: Secondary | ICD-10-CM | POA: Insufficient documentation

## 2018-12-21 DIAGNOSIS — Z9889 Other specified postprocedural states: Secondary | ICD-10-CM | POA: Insufficient documentation

## 2018-12-21 DIAGNOSIS — I69391 Dysphagia following cerebral infarction: Secondary | ICD-10-CM | POA: Insufficient documentation

## 2018-12-21 DIAGNOSIS — Z87891 Personal history of nicotine dependence: Secondary | ICD-10-CM | POA: Insufficient documentation

## 2018-12-21 NOTE — Progress Notes (Signed)
Subjective:    Patient ID: Xavier Villegas, male    DOB: Aug 08, 1981, 38 y.o.   MRN: [id number]  HPI  Due to national recommendations of social distancing because of COVID 29, an audio/video tele-health visit is felt to be the most appropriate encounter for this patient at this time. See MyChart message from today for the patient's consent to a tele-health encounter with [facility]. This is a follow up tele-visit via Webex. The patient is at home. MD is at office.    I am meeting with the patient today regarding his left MCA infarct and spastic right hemiparesis.  We performed phenol injections to his right tibial nerve and Botox to his wrist and finger flexors in January/February.  He had good results with these, especially phenol.  He has noticed some increase in his tone in the right arm and wrist over the last few weeks.  He is working on exercises at home.  His benefits ran out and outpatient therapies ended in late February.  He is doing some WebEx visits with [facility].  He has a communication device now that helps with his expressive language.  He is walking without his brace at times now.  Have there have been no falls or accidents.  He generally independent with his basic self-care.  He denies pain.  Sleep has been reasonable.  Mood is been upbeat.   Pain Inventory Average Pain 0 Pain Right Now 0 My pain is NA  In the last 24 hours, has pain interfered with the following? General activity NA Relation with others NA Enjoyment of life 0 What TIME of day is your pain at its worst? NA Sleep (in general) Fair  Pain is worse with: NA Pain improves with: NA Relief from Meds: NA  Mobility use a cane ability to climb steps?  yes do you drive?  no  Function not employed: date last employed 2019 disabled: date disabled 2019 I need assistance with the following:  bathing, meal prep, household duties and shopping  Neuro/Psych  weakness tremor trouble walking confusion  Prior Studies Any changes since last visit?  no  Physicians involved in your care Any changes since last visit?  no   Family History  Problem Relation Age of Onset  . Stroke Maternal Grandmother   . Liver cancer Maternal Grandmother   . Stroke Maternal Grandfather   . Leukemia Mother        CLL/[facility]. Leretha Pol at [facility]  . Diabetes Father   . High blood pressure Father   . Liver cancer Paternal Grandmother   . Cerebral aneurysm Paternal Grandfather   . Stroke Paternal Grandfather    Social History   Socioeconomic History  . Marital status: Divorced    Spouse name: Not on file  . Number of children: Not on file  . Years of education: Not on file  . Highest education level: Not on file  Occupational History  . Not on file  Social Needs  . Financial resource strain: Not on file  . Food insecurity:    Worry: Not on file    Inability: Not on file  . Transportation needs:    Medical: Not on file    Non-medical: Not on file  Tobacco Use  . Smoking status: Former Smoker    Packs/day: 0.50    Years: 8.00    Pack years: 4.00    Types: Cigarettes    Last attempt to quit: 02/19/2018  Years since quitting: 0.8  . Smokeless tobacco: Never Used  Substance and Sexual Activity  . Alcohol use: Not Currently    Alcohol/week: 14.0 standard drinks    Types: 14 Cans of beer per week  . Drug use: Never  . Sexual activity: Yes  Lifestyle  . Physical activity:    Days per week: Not on file    Minutes per session: Not on file  . Stress: Not on file  Relationships  . Social connections:    Talks on phone: Not on file    Gets together: Not on file    Attends religious service: Not on file    Active member of club or organization: Not on file    Attends meetings of clubs or organizations: Not on file    Relationship status: Not on file  Other Topics Concern  . Not on file  Social History Narrative  . Not on file   Past Surgical  History:  Procedure Laterality Date  . CRANIOTOMY Left 02/21/2018   Procedure: DECOMPRESSIVE CRANIECTOMY with bone flap to abdomen;  Surgeon: [name], MD;  Location: [place];  Service: Neurosurgery;  Laterality: Left;  DECOMPRESSIVE CRANIECTOMY with bone flap to abdomen  . CRANIOTOMY N/A 05/02/2018   Procedure: RE-IMPLANTATION OF CRANIAL FLAP;  Surgeon: [name], MD;  Location: [place];  Service: Neurosurgery;  Laterality: N/A;  RE-IMPLANTATION OF CRANIAL FLAP  . IR ANGIO INTRA EXTRACRAN SEL COM CAROTID INNOMINATE UNI R MOD SED  02/19/2018  . IR ANGIO VERTEBRAL SEL VERTEBRAL BILAT MOD SED  02/19/2018  . IR CT HEAD LTD  02/19/2018  . IR PERCUTANEOUS ART THROMBECTOMY/INFUSION INTRACRANIAL INC DIAG ANGIO  02/19/2018  . IR US GUIDE VASC ACCESS RIGHT  02/19/2018  . RADIOLOGY WITH ANESTHESIA N/A 02/19/2018   Procedure: IR WITH ANESTHESIA CODE STROKE;  Surgeon: Corrie Mckusick, DO;  Location: [place];  Service: Anesthesiology;  Laterality: N/A;  . TEE WITHOUT CARDIOVERSION N/A 03/01/2018   Procedure: TRANSESOPHAGEAL ECHOCARDIOGRAM (TEE);  Surgeon: Sanda Klein, MD;  Location: [facility] ENDOSCOPY;  Service: Cardiovascular;  Laterality: N/A;   Past Medical History:  Diagnosis Date  . Aphasia   . GERD (gastroesophageal reflux disease)   . Hemiparesis ([place]) 02/2018   right side  . Hyperhomocysteinemia ([place])   . Neurogenic bladder 02/2018   04/26/18 inporving  . Spastic hemiparesis (HCC)    right side  . Stroke ([place])    Left MCA infart  . Tobacco abuse    Ht 5\' 9"  (1.753 m)   Wt 220 lb (99.8 kg)   BMI 32.49 kg/m   Opioid Risk Score:   Fall Risk Score:  `1  Depression screen PHQ 2/9  Depression screen [facility] 2/9 07/11/2018 04/18/2018  Decreased Interest 0 0  Down, Depressed, Hopeless 0 0  PHQ - 2 Score 0 0  Some recent data might be hidden     Review of Systems  Constitutional: Negative.   HENT: Negative.   Eyes: Negative.   Respiratory: Negative.   Cardiovascular: Negative.   Gastrointestinal:  Negative.   Endocrine: Negative.   Genitourinary: Negative.   Musculoskeletal: Positive for gait problem.  Skin: Negative.   Allergic/Immunologic: Negative.   Neurological: Positive for tremors and weakness.  Hematological: Negative.   Psychiatric/Behavioral: Positive for confusion.  All other systems reviewed and are negative.           Assessment & Plan:  1. Right hemiparesis, dysphagia, and aphasia secondary to large left MCA infarct with hemorrhagic transformation s/p hemicraniectomy. -  Continue with HEP, webex with [facility]     -paperwork completed today for benefit management 2.Spasticity: Good results after Botox to right upper extremity. -continuebaclofen 10mg  QID -continue splint/ROM               -will arrange botox 500u for right finger and wrist fingers/pronators in one month   . 4. High homocysteine levels/low folate levels/borderline low B39:[id number] dose folic supplement, B6 and received B12 inj 7/14 . MRHFR--heterozygous /single mutation ID.  -Recommendations per neurology 5. Neurogenic bladder/bowel:improved -incontinence bladder improved  15 minutes of tele-visit time was spent with this patient today. Follow up in 1 months

## 2019-01-18 ENCOUNTER — Telehealth: Payer: Self-pay | Admitting: Family Medicine

## 2019-01-18 ENCOUNTER — Other Ambulatory Visit: Payer: Self-pay

## 2019-01-18 ENCOUNTER — Ambulatory Visit (INDEPENDENT_AMBULATORY_CARE_PROVIDER_SITE_OTHER): Payer: BC Managed Care – PPO | Admitting: Family Medicine

## 2019-01-18 ENCOUNTER — Encounter: Payer: Self-pay | Admitting: Family Medicine

## 2019-01-18 DIAGNOSIS — G8110 Spastic hemiplegia affecting unspecified side: Secondary | ICD-10-CM

## 2019-01-18 DIAGNOSIS — I63512 Cerebral infarction due to unspecified occlusion or stenosis of left middle cerebral artery: Secondary | ICD-10-CM

## 2019-01-18 DIAGNOSIS — R4701 Aphasia: Secondary | ICD-10-CM | POA: Diagnosis not present

## 2019-01-18 DIAGNOSIS — I69331 Monoplegia of upper limb following cerebral infarction affecting right dominant side: Secondary | ICD-10-CM

## 2019-01-18 NOTE — Progress Notes (Signed)
Patient ID: Xavier Villegas, male   DOB: 1981-04-23, 38 y.o.   MRN: [id number]    Virtual Visit via video Note  This visit type was conducted due to national recommendations for restrictions regarding the COVID-19 pandemic (e.g. social distancing).  This format is felt to be most appropriate for this patient at this time.  All issues noted in this document were discussed and addressed.  No physical exam was performed (except for noted visual exam findings with Video Visits).   I connected with Larey Days & father today at  8:20 AM EDT by a video enabled telemedicine application and verified that I am speaking with the correct person using two identifiers. Location patient: home Location provider: [facility] Persons participating in the virtual visit: patient, father, provider  I discussed the limitations, risks, security and privacy concerns of performing an evaluation and management service by video and the availability of in person appointments. I also discussed with the patient that there may be a patient responsible charge related to this service. The patient expressed understanding and agreed to proceed.  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  Patient is doing very well.  He lives with his dad who is sure that he gets all of the care and therapy he needs.  Patient was going in for physical therapy appointments, but they changed to more virtual and WebEx therapy appointments due to the COVID-19 pandemic.  Patient is walking more, he usually walks about 3 blocks a day and only uses his cane on occasion.  Motion with his right upper extremity is improving as well.  He is saying more words and working every day with speech therapy and on his own to increase his speech.  He has not had any seizure-like activity since Keppra  dose of 750 twice daily has started.  He is eating and drinking normally.  He is using the restroom without any problems, no issues with constipation or urinating.  For most part he stays home a lot due to the COVID-19 pandemic, he and his dad will go to the grocery store on occasion and when they do this he will wear a mask and they are sure to wash their hands diligently.  Patient denies fever or chills.  No cough, no shortness of breath or wheezing.  No chest pain.   ROS:   Constitutional: Negative for chills, fatigue and fever.  HENT: Negative for congestion, ear pain, sinus pain and sore throat.   Eyes: Negative.   Respiratory: Negative for cough, shortness of breath and wheezing.   Cardiovascular: Negative for chest pain, palpitations and leg swelling.  Gastrointestinal: Negative for abdominal pain, diarrhea, nausea and vomiting.  Genitourinary: Negative for dysuria, frequency and urgency.  Musculoskeletal: Negative for arthralgias and myalgias.  Skin: Negative for color change, pallor and rash.  Neurological: Negative for syncope, light-headedness and headaches.  Psychiatric/Behavioral: The patient is not nervous/anxious.     Past Medical History:  Diagnosis Date  . Aphasia   . GERD (gastroesophageal reflux disease)   . Hemiparesis ([place]) 02/2018   right side  . Hyperhomocysteinemia ([place])   . Neurogenic bladder 02/2018   04/26/18 inporving  . Spastic hemiparesis (HCC)    right side  . Stroke ([place])    Left MCA infart  . Tobacco abuse     Past Surgical History:  Procedure Laterality  Date  . CRANIOTOMY Left 02/21/2018   Procedure: DECOMPRESSIVE CRANIECTOMY with bone flap to abdomen;  Surgeon: [name], MD;  Location: [place];  Service: Neurosurgery;  Laterality: Left;  DECOMPRESSIVE CRANIECTOMY with bone flap to abdomen  . CRANIOTOMY N/A 05/02/2018   Procedure: RE-IMPLANTATION OF CRANIAL FLAP;  Surgeon: [name], MD;  Location: [place];  Service: Neurosurgery;   Laterality: N/A;  RE-IMPLANTATION OF CRANIAL FLAP  . IR ANGIO INTRA EXTRACRAN SEL COM CAROTID INNOMINATE UNI R MOD SED  02/19/2018  . IR ANGIO VERTEBRAL SEL VERTEBRAL BILAT MOD SED  02/19/2018  . IR CT HEAD LTD  02/19/2018  . IR PERCUTANEOUS ART THROMBECTOMY/INFUSION INTRACRANIAL INC DIAG ANGIO  02/19/2018  . IR US GUIDE VASC ACCESS RIGHT  02/19/2018  . RADIOLOGY WITH ANESTHESIA N/A 02/19/2018   Procedure: IR WITH ANESTHESIA CODE STROKE;  Surgeon: Corrie Mckusick, DO;  Location: [place];  Service: Anesthesiology;  Laterality: N/A;  . TEE WITHOUT CARDIOVERSION N/A 03/01/2018   Procedure: TRANSESOPHAGEAL ECHOCARDIOGRAM (TEE);  Surgeon: Sanda Klein, MD;  Location: [facility] ENDOSCOPY;  Service: Cardiovascular;  Laterality: N/A;    Family History  Problem Relation Age of Onset  . Stroke Maternal Grandmother   . Liver cancer Maternal Grandmother   . Stroke Maternal Grandfather   . Leukemia Mother        CLL/[facility] Leretha Pol at [facility]  . Diabetes Father   . High blood pressure Father   . Liver cancer Paternal Grandmother   . Cerebral aneurysm Paternal Grandfather   . Stroke Paternal Grandfather    Social History   Tobacco Use  . Smoking status: Former Smoker    Packs/day: 0.50    Years: 8.00    Pack years: 4.00    Types: Cigarettes    Last attempt to quit: 02/19/2018    Years since quitting: 0.9  . Smokeless tobacco: Never Used  Substance Use Topics  . Alcohol use: Not Currently    Alcohol/week: 14.0 standard drinks    Types: 14 Cans of beer per week    Current Outpatient Medications:  .  aspirin 325 MG tablet, Take 1 tablet (325 mg total) by mouth daily., Disp: , Rfl:  .  baclofen (LIORESAL) 10 MG tablet, Take 1 tablet (10 mg total) by mouth 4 (four) times daily., Disp: 120 each, Rfl: 3 .  cholecalciferol (VITAMIN D3) 25 MCG (1000 UT) tablet, Take 1,000 Units by mouth daily., Disp: , Rfl:  .  cyanocobalamin 1000 MCG tablet, Take 1 tablet (1,000 mcg total) by mouth daily., Disp: 30 tablet, Rfl: 1  .  folic acid (FOLVITE) 1 MG tablet, Take 1 tablet (1 mg total) by mouth 4 (four) times daily., Disp: 120 tablet, Rfl: 4 .  levETIRAcetam (KEPPRA) 750 MG tablet, Take 1 tablet (750 mg total) by mouth 2 (two) times daily., Disp: 180 tablet, Rfl: 3 .  OVER THE COUNTER MEDICATION, Take 2 capsules by mouth See admin instructions. Psyllium fiber 2 capsules by mouth everyday, Disp: , Rfl:  .  pantoprazole (PROTONIX) 40 MG tablet, Take 40 mg by mouth daily as needed (for GERD). , Disp: , Rfl:  .  polyethylene glycol (MIRALAX / GLYCOLAX) packet, Take 17 g by mouth daily as needed for mild constipation. , Disp: , Rfl:  .  pyridOXINE (B-6) 50 MG tablet, Take 1 tablet (50 mg total) by mouth daily., Disp: 30 tablet, Rfl: 1  EXAM:  GENERAL: alert, oriented, appears well and in no acute distress  HEENT: atraumatic, conjunttiva clear, no obvious  abnormalities on inspection of external nose and ears  NECK: normal movements of the head and neck  LUNGS: on inspection no signs of respiratory distress, breathing rate appears normal, no obvious gross SOB, gasping or wheezing  CV: no obvious cyanosis  MS: Right sided weakness since his stroke, improving each day.  PSYCH/NEURO: pleasant and cooperative, no obvious depression or anxiety, thought processing grossly intact. Speech improving, answers yes and no correctly, working on increasing his words - doing well.   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  He also continues to improve his speech and works regularly with PT, OT and speech therapy. He does not need any refills today. He will continue to wear mask if going out of the home and do good handwashing.  I discussed  the assessment and treatment plan with the patient. The patient was provided an opportunity to ask questions and all were answered. The patient agreed with the plan and demonstrated an understanding of the instructions.   The patient was advised to call back or seek an in-person evaluation if the symptoms worsen or if the condition fails to improve as anticipated.  15 minutes spent on video call with patient discussing how he is doing, meds, therapies  He will otherwise follow up in 6 months for management of chronic conditions.   Jodelle Green, FNP

## 2019-01-18 NOTE — Telephone Encounter (Signed)
Appt scheduled 07/18/19 @ 8:20am

## 2019-01-18 NOTE — Telephone Encounter (Signed)
Please schedule 6 month follow up for patient  Thanks  LG

## 2019-01-23 ENCOUNTER — Ambulatory Visit: Payer: BLUE CROSS/BLUE SHIELD | Admitting: Physical Medicine & Rehabilitation

## 2019-01-27 ENCOUNTER — Ambulatory Visit: Payer: BLUE CROSS/BLUE SHIELD | Admitting: Physical Medicine & Rehabilitation

## 2019-02-01 ENCOUNTER — Encounter
Payer: BC Managed Care – PPO | Attending: Physical Medicine & Rehabilitation | Admitting: Physical Medicine & Rehabilitation

## 2019-02-01 ENCOUNTER — Other Ambulatory Visit: Payer: Self-pay

## 2019-02-01 ENCOUNTER — Encounter: Payer: Self-pay | Admitting: Physical Medicine & Rehabilitation

## 2019-02-01 VITALS — BP 138/78 | HR 81 | Temp 97.7°F | Ht 68.0 in | Wt 238.0 lb

## 2019-02-01 DIAGNOSIS — N319 Neuromuscular dysfunction of bladder, unspecified: Secondary | ICD-10-CM | POA: Insufficient documentation

## 2019-02-01 DIAGNOSIS — Z87891 Personal history of nicotine dependence: Secondary | ICD-10-CM | POA: Diagnosis not present

## 2019-02-01 DIAGNOSIS — I69351 Hemiplegia and hemiparesis following cerebral infarction affecting right dominant side: Secondary | ICD-10-CM | POA: Insufficient documentation

## 2019-02-01 DIAGNOSIS — Z9889 Other specified postprocedural states: Secondary | ICD-10-CM | POA: Diagnosis not present

## 2019-02-01 DIAGNOSIS — I69320 Aphasia following cerebral infarction: Secondary | ICD-10-CM | POA: Diagnosis not present

## 2019-02-01 DIAGNOSIS — G8110 Spastic hemiplegia affecting unspecified side: Secondary | ICD-10-CM

## 2019-02-01 DIAGNOSIS — I69391 Dysphagia following cerebral infarction: Secondary | ICD-10-CM | POA: Insufficient documentation

## 2019-02-01 DIAGNOSIS — I69331 Monoplegia of upper limb following cerebral infarction affecting right dominant side: Secondary | ICD-10-CM | POA: Diagnosis not present

## 2019-02-01 DIAGNOSIS — K592 Neurogenic bowel, not elsewhere classified: Secondary | ICD-10-CM | POA: Insufficient documentation

## 2019-02-01 NOTE — Progress Notes (Signed)
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 This includes bleeding, bruising, infection, excessive weakness, or medication side effects. A REMS form is on file and signed.  Needle: 35mm injectable monopolar needle electrode  Number of units per muscle Pectoralis Major 0 units Pectoralis Minor 0 units Biceps 0 units Brachioradialis 0 units FCR 25 units FCU 25 units FDS 125 units FDP 125 units FPL 0 units Pronator Teres 150 units Pronator Quadratus 0 units Palmaris longus 50u   All injections were done after obtaining appropriate EMG activity and after negative drawback for blood. The patient tolerated the procedure well. Post procedure instructions were given. Return in about 2 months (around 04/03/2019).

## 2019-02-09 ENCOUNTER — Telehealth: Payer: Self-pay

## 2019-02-09 DIAGNOSIS — I63412 Cerebral infarction due to embolism of left middle cerebral artery: Secondary | ICD-10-CM

## 2019-02-09 NOTE — Telephone Encounter (Signed)
Patient called, requesting a referral for PT to be sent to [facility] instead of his current therapy location

## 2019-02-10 NOTE — Telephone Encounter (Signed)
Order written

## 2019-02-23 ENCOUNTER — Other Ambulatory Visit: Payer: Self-pay

## 2019-02-23 ENCOUNTER — Encounter: Payer: Self-pay | Admitting: Adult Health

## 2019-02-23 ENCOUNTER — Ambulatory Visit: Payer: BC Managed Care – PPO | Admitting: Adult Health

## 2019-02-23 VITALS — BP 132/91 | HR 77 | Temp 96.4°F | Ht 68.0 in | Wt 240.4 lb

## 2019-02-23 DIAGNOSIS — I69398 Other sequelae of cerebral infarction: Secondary | ICD-10-CM | POA: Diagnosis not present

## 2019-02-23 DIAGNOSIS — I63512 Cerebral infarction due to unspecified occlusion or stenosis of left middle cerebral artery: Secondary | ICD-10-CM

## 2019-02-23 DIAGNOSIS — G8111 Spastic hemiplegia affecting right dominant side: Secondary | ICD-10-CM

## 2019-02-23 DIAGNOSIS — R569 Unspecified convulsions: Secondary | ICD-10-CM

## 2019-02-23 DIAGNOSIS — H53461 Homonymous bilateral field defects, right side: Secondary | ICD-10-CM

## 2019-02-23 DIAGNOSIS — I10 Essential (primary) hypertension: Secondary | ICD-10-CM

## 2019-02-23 DIAGNOSIS — R4701 Aphasia: Secondary | ICD-10-CM | POA: Diagnosis not present

## 2019-02-23 NOTE — Patient Instructions (Signed)
Continue aspirin 325 mg daily   for secondary stroke prevention  Continue to follow up with PCP regarding cholesterol and blood pressure management/monitoring  Continue keppra 750mg  twice daily for seizure prevention  Continue to work with therapy - call office once you are ready for outpatient therapy so orders can be placed   Continue to monitor blood pressure at home  Maintain strict control of hypertension with blood pressure goal below 130/90, diabetes with hemoglobin A1c goal below 6.5% and cholesterol with LDL cholesterol (bad cholesterol) goal below 70 mg/dL. I also advised the patient to eat a healthy diet with plenty of whole grains, cereals, fruits and vegetables, exercise regularly and maintain ideal body weight.  Followup in the future with me in 6 months or call earlier if needed       Thank you for coming to see [place] at [facility]. I hope we have been able to provide you high quality care today.  You may receive a patient satisfaction survey over the next few weeks. We would appreciate your feedback and comments so that we may continue to improve ourselves and the health of our patients.

## 2019-02-23 NOTE — Progress Notes (Signed)
[facility] [address] [place]. [place] [address] [phone]       OFFICE FOLLOW UP NOTE  [name] Date of Birth:  06/03/1981 Medical Record Number:  [id number]   Reason for Referral:  hospital stroke follow up  CHIEF COMPLAINT:  Chief Complaint  Patient presents with   Follow-up    Room 8, With his father. CVA and Seizures.    HPI:  02/23/19 VISIT [name] is a 38 year old male who is being seen today for follow-up regarding seizure activity on 10/15/2018 likely secondary to prior left MCA infarct.  He did undergo EEG on 10/31/2018 which did not show evidence of seizure activity.  He denies any recurrent seizure activity and continues on Keppra 750 mg twice daily without reported side effects. Underwent repeat carotid ultrasound on 10/27/2018 which showed bilateral ICA stenosis of 1 to 39% and VAs antegrade flow.  Residual stroke deficits right spastic hemiparesis, severe expressive aphasia and right homonymous hemianopsia stable. Continues to work with therapy thru [employer].  He was previously working with speech therapy at [facility] but has since been completed and is currently attempting to obtain speech machine.  Insurance will start paying for outpatient therapy again after 03/11/19. Blood pressure 132/91 which is normal for patient. Continues on aspirin 325mg  daily without bleeding or bruising. No further concerns at this time.     10/25/2018 visit: [name] is a 38 year old male who is being seen today for follow-up visit after admission to ED on 10/15/2018 after witnessed seizure activity with history of left MCA infarct with small ICH/SDH hemorrhagic conversion s/p craniotomy.  Per ED notes, consisted of "moving his right arm, sticking his tongue out and convulsing for approximately 2 to 3 minutes along with incontinence episode per mother".  Reported postictal state per mother.  He ultimately had recurrent seizure during  admission lasting for less than 2 minutes.  CT head reviewed and was negative for acute intracranial abnormality and showed stable chronic large region of encephalomalacia in the left MCA territory with small chronic left frontal extra-axial collection deep to the left craniotomy. initiated Keppra 750 mg twice daily at discharge for seizure prophylaxis.  He denies any recurrent seizure activity since this time.  He has tolerated Keppra without reported side effects.  He continues to have residual stroke deficits of right spastic hemiparesis, severe expressive aphasia and right homonymous hemianopia.  No further concerns at this time.  He does continue to take aspirin 325 mg without reported side effects of bleeding or bruising.  Blood pressure today satisfactory at 128/81.  He continues to live at home but is able to maintain ADLs independently.  He was previously participating at [employer] for physical and speech therapy as his insurance will not approve additional therapy sessions at this time.  Unfortunately, due to recent pandemic, these colleges have close at this time but he plans on restarting once able.  He returns today for reevaluation and follow-up regarding recent seizure activity.      History 08/24/2018: Patient is being seen today for 45-month follow-up visit and is accompanied by his mother.  He has been doing well since prior visit with continued post stroke deficits of right spastic hemiparesis, severe expressive aphasia and right homonymous hemianopia. He continues to work with PT/OT at our neuro rehab clinic and does endorse some improvement of his hemiparesis.  He currently is ambulating with a cane but typically will not use the cane in his home unless  he is going up steps.  He is currently on baclofen to assist with spasticity and he does endorse benefit from this.  He is unable to participate in further ST sessions due to insurance reasons and plans on looking into volunteering at a  speech therapy school in hopes of obtaining additional ST.  He does follow with ophthalmology for peripheral vision exams and per mother, these have been stable.  He continues on aspirin 325 mg without side effects of bleeding or bruising.  Blood pressure today satisfactory 130/85.  No further concerns at this time.  Denies new or worsening stroke/TIA symptoms.  05/17/2018 visit: Patient is being seen today for hospital follow up and is accompanied by his aunt. Underwent re-implantation of cranial flap on 05/02/18 which was tolerated well without complication.  He continues to have right hemiparesis with spasticity and expressive aphasia but does feel as though all have been improving.  He participates at our neuro rehab clinic where he receives PT/OT/ST.  He is able to ambulate with quad cane and use of foot drop brace but denies any recent falls.  He is able to say a few words but for the most part nods head appropriately.  He also continues to have right homonymous hemianopia and is unsure if there has been any improvement.  He was discharged from hospital stay on aspirin 325 and has continued to take this without side effects of bleeding or bruising.  Blood pressure today satisfactory at 125/80.  He currently is living with his father for continued assistance but is able to bathe and dress himself independently.  Patient appears to be in good spirits regarding recovery progress and the future.  Denies any depression-like symptoms or anxiety.  No further concerns at this time.  Denies new or worsening stroke/TIA symptoms.   03/01/2018 hospital admission: Xavier Villegas a 38 y.o.malewith no significant past medical history other than tobacco usewho presented withright-sided weakness and aphasia.Hedid not receive IV t-PA due to late presentation.  CT head reviewed and showed large territory acute infarct left MCA territory.  Due to these findings, he was taken to MRI for consideration of IR  if his infarct burden appears less than estimated by CTP but unfortunately due to other patient care issues there would have been a significant delay therefore patient was taken to IR for intervention. Patient underwent attempted thrombectomy for occlusive left ICA which resulted in left ICA dissection and possible ICH.  Postprocedure CT showed subarachnoid hemorrhage with hemorrhagic transformation.  MRI head reviewed and showed large left MCA infarct with small ICH/SDH hemorrhagic conversion.  MRA head showed left ICA and MCA occlusion.  CTA head and neck showed acute dissection left internal carotid artery with intraluminal thrombus along with 2 mm midline shift.  Repeat CT showed progressive cytotoxic edema with slight mass-effect and midline shift where he required left decompressive hemicraniotomy as well as hypertonic saline for management of edema. Repeat CT scan showed improvement after treatment. Lower extremity venous Dopplers were negative for DVT.  Carotid Doppler showed left ICA occlusion.  2D echo showed an EF of 55 to 60% with aortic valve mass versus vegetation.  TEE was normal without evidence of vegetation.  Hypercoagulable work-up did show high homocystine at 181.7 and he was started on high-dose folate and B6 as well as B12 injection.  LDL 46 and A1c 4.9.  Patient was discharged to [facility] for continuation of therapy in stable condition. Patient was discharged home on 04/06/18 with 24 hr supervision.  ROS:   14 system review of systems performed and negative with exception of seizures, weakness, visual loss difficulty    PMH:  Past Medical History:  Diagnosis Date   Aphasia    GERD (gastroesophageal reflux disease)    Hemiparesis ([place]) 02/2018   right side   Hyperhomocysteinemia ([facility])    Neurogenic bladder 02/2018   04/26/18 inporving   Spastic hemiparesis ([facility])    right side   Stroke ([place])    Left MCA infart   Tobacco abuse     PSH:  Past Surgical History:    Procedure Laterality Date   CRANIOTOMY Left 02/21/2018   Procedure: DECOMPRESSIVE CRANIECTOMY with bone flap to abdomen;  Surgeon: [name], MD;  Location: [facility];  Service: Neurosurgery;  Laterality: Left;  DECOMPRESSIVE CRANIECTOMY with bone flap to abdomen   CRANIOTOMY N/A 05/02/2018   Procedure: RE-IMPLANTATION OF CRANIAL FLAP;  Surgeon: [name], MD;  Location: [place];  Service: Neurosurgery;  Laterality: N/A;  RE-IMPLANTATION OF CRANIAL FLAP   IR ANGIO INTRA EXTRACRAN SEL COM CAROTID INNOMINATE UNI R MOD SED  02/19/2018   IR ANGIO VERTEBRAL SEL VERTEBRAL BILAT MOD SED  02/19/2018   IR CT HEAD LTD  02/19/2018   IR PERCUTANEOUS ART THROMBECTOMY/INFUSION INTRACRANIAL INC DIAG ANGIO  02/19/2018   IR US GUIDE VASC ACCESS RIGHT  02/19/2018   RADIOLOGY WITH ANESTHESIA N/A 02/19/2018   Procedure: IR WITH ANESTHESIA CODE STROKE;  Surgeon: Corrie Mckusick, DO;  Location: [place];  Service: Anesthesiology;  Laterality: N/A;   TEE WITHOUT CARDIOVERSION N/A 03/01/2018   Procedure: TRANSESOPHAGEAL ECHOCARDIOGRAM (TEE);  Surgeon: Sanda Klein, MD;  Location: [facility] ENDOSCOPY;  Service: Cardiovascular;  Laterality: N/A;    Social History:  Social History   Socioeconomic History   Marital status: Divorced    Spouse name: Not on file   Number of children: Not on file   Years of education: Not on file   Highest education level: Not on file  Occupational History   Not on file  Social Needs   Financial resource strain: Not on file   Food insecurity    Worry: Not on file    Inability: Not on file   Transportation needs    Medical: Not on file    Non-medical: Not on file  Tobacco Use   Smoking status: Former Smoker    Packs/day: 0.50    Years: 8.00    Pack years: 4.00    Types: Cigarettes    Quit date: 02/19/2018    Years since quitting: 1.0   Smokeless tobacco: Never Used  Substance and Sexual Activity   Alcohol use: Not Currently    Alcohol/week: 14.0 standard drinks     Types: 14 Cans of beer per week   Drug use: Never   Sexual activity: Yes  Lifestyle   Physical activity    Days per week: Not on file    Minutes per session: Not on file   Stress: Not on file  Relationships   Social connections    Talks on phone: Not on file    Gets together: Not on file    Attends religious service: Not on file    Active member of club or organization: Not on file    Attends meetings of clubs or organizations: Not on file    Relationship status: Not on file   Intimate partner violence    Fear of current or ex partner: Not on file    Emotionally abused: Not on file  Physically abused: Not on file    Forced sexual activity: Not on file  Other Topics Concern   Not on file  Social History Narrative   Not on file    Family History:  Family History  Problem Relation Age of Onset   Stroke Maternal Grandmother    Liver cancer Maternal Grandmother    Stroke Maternal Grandfather    Leukemia Mother        CLL/[facility]. Leretha Pol at [facility]   Diabetes Father    High blood pressure Father    Liver cancer Paternal Grandmother    Cerebral aneurysm Paternal Grandfather    Stroke Paternal Grandfather     Medications:   Current Outpatient Medications on File Prior to Visit  Medication Sig Dispense Refill   aspirin 325 MG tablet Take 1 tablet (325 mg total) by mouth daily.     baclofen (LIORESAL) 10 MG tablet Take 1 tablet (10 mg total) by mouth 4 (four) times daily. 120 each 3   cholecalciferol (VITAMIN D3) 25 MCG (1000 UT) tablet Take 1,000 Units by mouth daily.     cyanocobalamin 1000 MCG tablet Take 1 tablet (1,000 mcg total) by mouth daily. 30 tablet 1   folic acid (FOLVITE) 1 MG tablet Take 1 tablet (1 mg total) by mouth 4 (four) times daily. 120 tablet 4   levETIRAcetam (KEPPRA) 750 MG tablet Take 1 tablet (750 mg total) by mouth 2 (two) times daily. 180 tablet 3   OVER THE COUNTER MEDICATION Take 2 capsules by mouth See admin instructions.  Psyllium fiber 2 capsules by mouth everyday     pantoprazole (PROTONIX) 40 MG tablet Take 40 mg by mouth daily as needed (for GERD).      polyethylene glycol (MIRALAX / GLYCOLAX) packet Take 17 g by mouth daily as needed for mild constipation.      pyridOXINE (B-6) 50 MG tablet Take 1 tablet (50 mg total) by mouth daily. 30 tablet 1   No current facility-administered medications on file prior to visit.     Allergies:  No Known Allergies   Physical Exam  Vitals:   02/23/19 1345  BP: (!) 132/91  Pulse: 77  Temp: (!) 96.4 F (35.8 C)  Weight: 240 lb 6.4 oz (109 kg)  Height: 5\' 8"  (1.727 m)   Body mass index is 36.55 kg/m. No exam data present  General: well developed, well nourished, pleasant young Caucasian male, seated, in no evident distress Head: head normocephalic and atraumatic.   Neck: supple with no carotid or supraclavicular bruits Cardiovascular: regular rate and rhythm, no murmurs Musculoskeletal: no deformity Skin:  no rash/petichiae Vascular:  Normal pulses all extremities  Neurologic Exam Mental Status: Awake and fully alert.  Severe expressive aphasia.  Nods appropriately to yes/no questions.  Oriented to place and time. Recent and remote memory intact. Attention span, concentration and fund of knowledge appropriate. Mood and affect appropriate.  Cranial Nerves: Pupils equal, briskly reactive to light. Extraocular movements full without nystagmus. Visual fields mild right homonymous hemianopia.Marland Kitchen Hearing intact.  Left-sided facial sensation decreased compared to left side.  Mild facial paralysis right side.  Motor: RUE: 0/5; RLE: 4+/5 hip flexor, 4/5 quad, weak ankle dorsiflexon with use of AFO; both with spasticity.  Full strength left upper and lower extremity Sensory.:  Decreased sensation right upper and lower extremity compared to left upper and lower extremity Coordination: Rapid alternating movements normal on left side. Finger-to-nose and heel-to-shin  performed accurately on left side. Gait and Station: [employer]  from chair with mild difficulty. Stance is normal. Gait demonstrates hemiplegic gait with assistance of cane Reflexes: 1+ left upper and lower extremity.  2+ right upper and lower extremity.  Toes downgoing.       Diagnostic Data (Labs, Imaging, Testing)  CT HEAD WO CONTRAST IMPRESSION: 1. No evidence of acute intracranial abnormality. No evidence of calvarial fracture. 2. Stable chronic large region of encephalomalacia in the left MCA territory. Small chronic left frontal extra-axial collection deep to the left craniotomy is decreased in size in the interval.   ASSESSMENT: Xavier Villegas is a 38 y.o. year old male here with large left MCA infarct on 02/19/18 secondary to left ICA dissection s/p [id number] revascularization of L MCA M1/2 followed by decompressive craniectomy. Vascular risk factors include tobacco use, EtOH use, HTN and severe hyperhomocysteinemia.  He unfortunately has sustained recent seizure activity on 10/15/2018 likely related to prior large left MCA infarct and was initiated on Keppra for seizure prophylaxis.  He has not had any additional seizure events with continuation of Keppra.  Residual stroke deficits stable which include right hemiparesis, right homonymous hemianopia and severe expressive aphasia.    PLAN: -Continue aspirin 325 mg daily for secondary stroke prevention -Advised to continue Keppra 750 mg twice daily for seizure prophylaxis -F/u with PCP regarding annual physicals and stroke risk monitoring -recommend obtaining lipid panel at follow-up visit with PCP to ensure adequate levels -Continue participation with college student therapist and advised to call office once insurance will cover outpatient therapy so orders can be placed -continue to monitor BP at home -advised to continue to stay active at home along with home therapy exercises and maintain a healthy diet -Maintain strict  control of hypertension with blood pressure goal below 130/90, diabetes with hemoglobin A1c goal below 6.5% and cholesterol with LDL cholesterol (bad cholesterol) goal below 70 mg/dL. I also advised the patient to eat a healthy diet with plenty of whole grains, cereals, fruits and vegetables, exercise regularly and maintain ideal body weight.  Follow up in 6 months or call earlier if needed   Greater than 50% of time during this 25 minute visit was spent on counseling,explanation of diagnosis of left MCA infarct, reviewing risk factor management of left ICA dissection, seizure activity likely related to left MCA infarct and potential of recurrence in the future, planning of further management, discussion with patient and family and coordination of care    Venancio Poisson, [facility]  [facility] [address] [place], [place] [address]  Phone [phone] Fax [phone] Note: This document was prepared with digital dictation and possible smart phrase technology. Any transcriptional errors that result from this process are unintentional.

## 2019-02-24 NOTE — Progress Notes (Signed)
I agree with the above plan 

## 2019-03-01 DIAGNOSIS — R4701 Aphasia: Secondary | ICD-10-CM | POA: Diagnosis not present

## 2019-03-01 DIAGNOSIS — R482 Apraxia: Secondary | ICD-10-CM | POA: Diagnosis not present

## 2019-03-01 DIAGNOSIS — I639 Cerebral infarction, unspecified: Secondary | ICD-10-CM | POA: Diagnosis not present

## 2019-03-07 ENCOUNTER — Encounter

## 2019-03-07 ENCOUNTER — Other Ambulatory Visit: Payer: Self-pay | Admitting: Physical Medicine & Rehabilitation

## 2019-03-07 ENCOUNTER — Encounter
Payer: BC Managed Care – PPO | Attending: Physical Medicine & Rehabilitation | Admitting: Physical Medicine & Rehabilitation

## 2019-03-07 ENCOUNTER — Encounter: Payer: Self-pay | Admitting: Physical Medicine & Rehabilitation

## 2019-03-07 ENCOUNTER — Other Ambulatory Visit: Payer: Self-pay

## 2019-03-07 VITALS — BP 136/86 | HR 79 | Temp 97.7°F | Ht 68.0 in | Wt 242.0 lb

## 2019-03-07 DIAGNOSIS — I63412 Cerebral infarction due to embolism of left middle cerebral artery: Secondary | ICD-10-CM

## 2019-03-07 DIAGNOSIS — K592 Neurogenic bowel, not elsewhere classified: Secondary | ICD-10-CM | POA: Insufficient documentation

## 2019-03-07 DIAGNOSIS — I69351 Hemiplegia and hemiparesis following cerebral infarction affecting right dominant side: Secondary | ICD-10-CM | POA: Insufficient documentation

## 2019-03-07 DIAGNOSIS — I69391 Dysphagia following cerebral infarction: Secondary | ICD-10-CM | POA: Insufficient documentation

## 2019-03-07 DIAGNOSIS — G8110 Spastic hemiplegia affecting unspecified side: Secondary | ICD-10-CM | POA: Diagnosis not present

## 2019-03-07 DIAGNOSIS — I69320 Aphasia following cerebral infarction: Secondary | ICD-10-CM | POA: Diagnosis not present

## 2019-03-07 DIAGNOSIS — Z87891 Personal history of nicotine dependence: Secondary | ICD-10-CM | POA: Insufficient documentation

## 2019-03-07 DIAGNOSIS — N319 Neuromuscular dysfunction of bladder, unspecified: Secondary | ICD-10-CM | POA: Insufficient documentation

## 2019-03-07 DIAGNOSIS — E539 Vitamin B deficiency, unspecified: Secondary | ICD-10-CM

## 2019-03-07 DIAGNOSIS — Z9889 Other specified postprocedural states: Secondary | ICD-10-CM | POA: Insufficient documentation

## 2019-03-07 NOTE — Patient Instructions (Signed)
PLEASE FEEL FREE TO CALL OUR OFFICE WITH ANY PROBLEMS OR QUESTIONS ([phone])      

## 2019-03-07 NOTE — Progress Notes (Signed)
Phenol neurolysis of the right tibial nerve  Indication: Severe spasticity in the plantar flexor muscles which is not responding to medical management and other conservative care and interfering with functional use.  Informed consent was obtained after describing the risks and benefits of the procedure with the patient this includes bleeding bruising and infection as well as medication side effects. The patient elected to proceed and has given written consent. Patient placed in a prone position on the exam table. External DC stimulation was applied to the popliteal space using a nerve stimulator. Plantar flexion twitch was obtained. The popliteal region was prepped with Betadine and then entered with a 22-gauge 40 mm needle electrode under electrical stimulation guidance. Plantar flexion which was obtained and confirmed. Then 4 cc of 5% phenol were injected. The patient tolerated procedure well. Post procedure instructions and followup visit were given.   We can pursue further botox in about 4-6 weeks. 500 u RUE

## 2019-04-05 ENCOUNTER — Ambulatory Visit: Payer: BC Managed Care – PPO | Admitting: Physical Medicine & Rehabilitation

## 2019-04-12 ENCOUNTER — Encounter: Payer: Self-pay | Admitting: Physical Medicine & Rehabilitation

## 2019-04-12 ENCOUNTER — Other Ambulatory Visit: Payer: Self-pay

## 2019-04-12 ENCOUNTER — Encounter
Payer: BC Managed Care – PPO | Attending: Physical Medicine & Rehabilitation | Admitting: Physical Medicine & Rehabilitation

## 2019-04-12 VITALS — BP 138/87 | HR 77 | Temp 98.9°F | Ht 67.0 in | Wt 241.0 lb

## 2019-04-12 DIAGNOSIS — I69391 Dysphagia following cerebral infarction: Secondary | ICD-10-CM | POA: Diagnosis not present

## 2019-04-12 DIAGNOSIS — Z9889 Other specified postprocedural states: Secondary | ICD-10-CM | POA: Insufficient documentation

## 2019-04-12 DIAGNOSIS — I69320 Aphasia following cerebral infarction: Secondary | ICD-10-CM | POA: Insufficient documentation

## 2019-04-12 DIAGNOSIS — E539 Vitamin B deficiency, unspecified: Secondary | ICD-10-CM | POA: Diagnosis not present

## 2019-04-12 DIAGNOSIS — K592 Neurogenic bowel, not elsewhere classified: Secondary | ICD-10-CM | POA: Diagnosis not present

## 2019-04-12 DIAGNOSIS — G8110 Spastic hemiplegia affecting unspecified side: Secondary | ICD-10-CM

## 2019-04-12 DIAGNOSIS — I69351 Hemiplegia and hemiparesis following cerebral infarction affecting right dominant side: Secondary | ICD-10-CM | POA: Diagnosis not present

## 2019-04-12 DIAGNOSIS — R4701 Aphasia: Secondary | ICD-10-CM | POA: Diagnosis not present

## 2019-04-12 DIAGNOSIS — N319 Neuromuscular dysfunction of bladder, unspecified: Secondary | ICD-10-CM | POA: Diagnosis not present

## 2019-04-12 DIAGNOSIS — Z87891 Personal history of nicotine dependence: Secondary | ICD-10-CM | POA: Insufficient documentation

## 2019-04-12 MED ORDER — BACLOFEN 10 MG PO TABS: 10.0000 mg | ORAL_TABLET | Freq: Four times a day (QID) | ORAL | 3 refills | Status: DC

## 2019-04-12 NOTE — Patient Instructions (Signed)
PLEASE FEEL FREE TO CALL OUR OFFICE WITH ANY PROBLEMS OR QUESTIONS ([phone])      

## 2019-04-12 NOTE — Progress Notes (Signed)
Subjective:    Patient ID: Xavier Villegas, male    DOB: June 20, 1981, 38 y.o.   MRN: [id number]  HPI   Xavier Villegas is here in follow-up of his left MCA infarct.  We performed Botox injections to his right wrist finger flexors as well as pronators back in June.  Report performed phenol injection to the right plantar flexors of the foot in July.  He had good responses with both.  The fingers are becoming tight again however.  He has been working on his walking at home.  He sometimes walks without his AFO.  Dad notes improvement all-around especially with his walking.  They purchased the program lingraphica and are working on his language on a daily basis.  Dad states that he feels he is making slow progress although it sometimes is frustrating.  He feels that he has "lost his ABCs".  Pain Inventory Average Pain 4 Pain Right Now 3 My pain is sharp and tingling  In the last 24 hours, has pain interfered with the following? General activity 4 Relation with others 4 Enjoyment of life 4 What TIME of day is your pain at its worst? daytime Sleep (in general) Fair  Pain is worse with: walking and bending Pain improves with: medication Relief from Meds: 4  Mobility use a cane ability to climb steps?  yes do you drive?  no  Function disabled: date disabled 2020 I need assistance with the following:  bathing, meal prep, household duties and shopping  Neuro/Psych weakness numbness  Prior Studies Any changes since last visit?  no  Physicians involved in your care Any changes since last visit?  no   Family History  Problem Relation Age of Onset  . Stroke Maternal Grandmother   . Liver cancer Maternal Grandmother   . Stroke Maternal Grandfather   . Leukemia Mother        CLL/[facility]. Leretha Pol at [facility]  . Diabetes Father   . High blood pressure Father   . Liver cancer Paternal Grandmother   . Cerebral aneurysm Paternal Grandfather   . Stroke Paternal Grandfather    Social History    Socioeconomic History  . Marital status: Divorced    Spouse name: Not on file  . Number of children: Not on file  . Years of education: Not on file  . Highest education level: Not on file  Occupational History  . Not on file  Social Needs  . Financial resource strain: Not on file  . Food insecurity    Worry: Not on file    Inability: Not on file  . Transportation needs    Medical: Not on file    Non-medical: Not on file  Tobacco Use  . Smoking status: Former Smoker    Packs/day: 0.50    Years: 8.00    Pack years: 4.00    Types: Cigarettes    Quit date: 02/19/2018    Years since quitting: 1.1  . Smokeless tobacco: Never Used  Substance and Sexual Activity  . Alcohol use: Not Currently    Alcohol/week: 14.0 standard drinks    Types: 14 Cans of beer per week  . Drug use: Never  . Sexual activity: Yes  Lifestyle  . Physical activity    Days per week: Not on file    Minutes per session: Not on file  . Stress: Not on file  Relationships  . Social Herbalist on phone: Not on file    Gets together: Not on  file    Attends religious service: Not on file    Active member of club or organization: Not on file    Attends meetings of clubs or organizations: Not on file    Relationship status: Not on file  Other Topics Concern  . Not on file  Social History Narrative  . Not on file   Past Surgical History:  Procedure Laterality Date  . CRANIOTOMY Left 02/21/2018   Procedure: DECOMPRESSIVE CRANIECTOMY with bone flap to abdomen;  Surgeon: [name], MD;  Location: [place];  Service: Neurosurgery;  Laterality: Left;  DECOMPRESSIVE CRANIECTOMY with bone flap to abdomen  . CRANIOTOMY N/A 05/02/2018   Procedure: RE-IMPLANTATION OF CRANIAL FLAP;  Surgeon: [name], MD;  Location: [place];  Service: Neurosurgery;  Laterality: N/A;  RE-IMPLANTATION OF CRANIAL FLAP  . IR ANGIO INTRA EXTRACRAN SEL COM CAROTID INNOMINATE UNI R MOD SED  02/19/2018  . IR ANGIO VERTEBRAL SEL  VERTEBRAL BILAT MOD SED  02/19/2018  . IR CT HEAD LTD  02/19/2018  . IR PERCUTANEOUS ART THROMBECTOMY/INFUSION INTRACRANIAL INC DIAG ANGIO  02/19/2018  . IR US GUIDE VASC ACCESS RIGHT  02/19/2018  . RADIOLOGY WITH ANESTHESIA N/A 02/19/2018   Procedure: IR WITH ANESTHESIA CODE STROKE;  Surgeon: Corrie Mckusick, DO;  Location: [place];  Service: Anesthesiology;  Laterality: N/A;  . TEE WITHOUT CARDIOVERSION N/A 03/01/2018   Procedure: TRANSESOPHAGEAL ECHOCARDIOGRAM (TEE);  Surgeon: Sanda Klein, MD;  Location: [facility] ENDOSCOPY;  Service: Cardiovascular;  Laterality: N/A;   Past Medical History:  Diagnosis Date  . Aphasia   . GERD (gastroesophageal reflux disease)   . Hemiparesis ([place]) 02/2018   right side  . Hyperhomocysteinemia ([place])   . Neurogenic bladder 02/2018   04/26/18 inporving  . Spastic hemiparesis (HCC)    right side  . Stroke ([place])    Left MCA infart  . Tobacco abuse    There were no vitals taken for this visit.  Opioid Risk Score:   Fall Risk Score:  `1  Depression screen PHQ 2/9  Depression screen [facility] 2/9 03/07/2019 01/18/2019 07/11/2018 04/18/2018  Decreased Interest 0 0 0 0  Down, Depressed, Hopeless 0 0 0 0  PHQ - 2 Score 0 0 0 0  Altered sleeping - 0 - -  Tired, decreased energy - 0 - -  Change in appetite - 0 - -  Feeling bad or failure about yourself  - 0 - -  Trouble concentrating - 0 - -  Moving slowly or fidgety/restless - 0 - -  Suicidal thoughts - 0 - -  PHQ-9 Score - 0 - -  Difficult doing work/chores - Not difficult at all - -  Some recent data might be hidden     Review of Systems  Constitutional: Positive for unexpected weight change.  HENT: Negative.   Eyes: Negative.   Respiratory: Negative.   Cardiovascular: Negative.   Gastrointestinal: Positive for constipation.  Endocrine: Negative.   Genitourinary: Negative.   Musculoskeletal: Positive for gait problem.  Skin: Negative.   Allergic/Immunologic: Negative.   Neurological: Positive for weakness  and numbness.  Hematological: Negative.   Psychiatric/Behavioral: Negative.   All other systems reviewed and are negative.      Objective:   Physical Exam   Gen: no distress, normal appearing HEENT: oral mucosa pink and moist, NCAT Cardio: Reg rate Chest: normal effort, normal rate of breathing Abd: soft, non-distended Ext: no edema Skin: intact Neuro: RUE 1-2/5 prox to trace distally. RLE 2 to 2+/5 to  trace to 1/5.  Tone at the right wrist and finger flexors is 2+ out of 4.  Pronators 2 out of 4.  Ongoing word finding deficits but showing improvement.  Ambulated with his AFO and shows improved swing of the right leg with less circumduction.  He even almost has some heel strike during initial weightbearing on the right side.  He holds the right arm in a flexed pattern during gait. Musculoskeletal: minimal pain.  Psych: pleasant, normal affect        Assessment & Plan:  1. Right hemiparesis, dysphagia, and aphasia secondary to large left MCA infarct with hemorrhagic transformation s/p hemicraniectomy. -Continue with HEP and outpatient therapies             -Discussed language therapies with dad.  Like him to get back over to [facility] speech [facility] program again when available. 2.Spasticity:Good results after Botox to right upper extremity. -continuebaclofen 10mg  QID--refill this today -continue splint/ROM  -We will once again arrange Botox 500u for right finger and wrist fingers/pronators in one month  .  15  minutes of time was spent in direct patient examination and consultation today.  I will see him back in around a month's time for Botox.

## 2019-04-19 ENCOUNTER — Encounter: Payer: BC Managed Care – PPO | Admitting: Physical Medicine & Rehabilitation

## 2019-04-26 ENCOUNTER — Ambulatory Visit: Payer: BC Managed Care – PPO | Attending: Physical Medicine & Rehabilitation

## 2019-04-26 ENCOUNTER — Other Ambulatory Visit: Payer: Self-pay

## 2019-04-26 VITALS — BP 134/84 | HR 82

## 2019-04-26 DIAGNOSIS — R2689 Other abnormalities of gait and mobility: Secondary | ICD-10-CM | POA: Diagnosis not present

## 2019-04-26 DIAGNOSIS — R278 Other lack of coordination: Secondary | ICD-10-CM | POA: Diagnosis not present

## 2019-04-26 DIAGNOSIS — M6281 Muscle weakness (generalized): Secondary | ICD-10-CM | POA: Insufficient documentation

## 2019-04-26 DIAGNOSIS — R2681 Unsteadiness on feet: Secondary | ICD-10-CM

## 2019-04-26 NOTE — Therapy (Signed)
Xavier Villegas [facility] SERVICES [address] [place], [place], [address] Phone: [phone]   Fax:  [phone]  Physical Therapy Evaluation  Patient Details  Name: Xavier Villegas MRN: [id number] Date of Birth: [birth year] Referring Provider (PT): Dr. Naaman Plummer   Encounter Date: 04/26/2019  PT End of Session - 04/27/19 0813    Visit Number  1    Number of Visits  13    Date for PT Re-Evaluation  07/19/19    PT Start Time  0930    PT Stop Time  1030    PT Time Calculation (min)  60 min    Equipment Utilized During Treatment  Gait belt;Other (comment)   R AFO and SPC   Activity Tolerance  Patient tolerated treatment well    Behavior During Therapy  WFL for tasks assessed/performed       Past Medical History:  Diagnosis Date  . Aphasia   . GERD (gastroesophageal reflux disease)   . Hemiparesis ([place]) 02/2018   right side  . Hyperhomocysteinemia ([place])   . Neurogenic bladder 02/2018   04/26/18 inporving  . Spastic hemiparesis (HCC)    right side  . Stroke ([place])    Left MCA infart  . Tobacco abuse     Past Surgical History:  Procedure Laterality Date  . CRANIOTOMY Left 02/21/2018   Procedure: DECOMPRESSIVE CRANIECTOMY with bone flap to abdomen;  Surgeon: [name], MD;  Location: [facility];  Service: Neurosurgery;  Laterality: Left;  DECOMPRESSIVE CRANIECTOMY with bone flap to abdomen  . CRANIOTOMY N/A 05/02/2018   Procedure: RE-IMPLANTATION OF CRANIAL FLAP;  Surgeon: [name], MD;  Location: [place];  Service: Neurosurgery;  Laterality: N/A;  RE-IMPLANTATION OF CRANIAL FLAP  . IR ANGIO INTRA EXTRACRAN SEL COM CAROTID INNOMINATE UNI R MOD SED  02/19/2018  . IR ANGIO VERTEBRAL SEL VERTEBRAL BILAT MOD SED  02/19/2018  . IR CT HEAD LTD  02/19/2018  . IR PERCUTANEOUS ART THROMBECTOMY/INFUSION INTRACRANIAL INC DIAG ANGIO  02/19/2018  . IR US GUIDE VASC ACCESS RIGHT  02/19/2018  . RADIOLOGY WITH ANESTHESIA N/A 02/19/2018   Procedure: IR WITH  ANESTHESIA CODE STROKE;  Surgeon: Corrie Mckusick, DO;  Location: [facility];  Service: Anesthesiology;  Laterality: N/A;  . TEE WITHOUT CARDIOVERSION N/A 03/01/2018   Procedure: TRANSESOPHAGEAL ECHOCARDIOGRAM (TEE);  Surgeon: Sanda Klein, MD;  Location: [facility] ENDOSCOPY;  Service: Cardiovascular;  Laterality: N/A;    Vitals:   04/26/19 0940  BP: 134/84  Pulse: 82  SpO2: 98%     Subjective Assessment - 04/26/19 0942    Subjective  Pt presents for re-evaluation and continuation of PT services    Patient is accompained by:  Family member    Pertinent History  Pt returns to PT for re-evaluation after a break from therapy due to limited insurance visits.  He has been working via telehealth with [employer] clinic once a week, but finished a few months ago.  He still requires assistance with showering with washing his backside, and has been using a shower chair due to balance.  He does have grab bars in the shower.  He has been following his HEP from [place] as well as his previous PT sessions including walking backwards in the living room, walking side to side, stepping forward with alternating feet, and being on his knees in front of couch with weighted backpack completing trunk rotations.  His dad states that he is starting to get a heel strike with gait with  ambulation.  Previous hx: Xavier Villegas had left MCA infarct and subsequent craniectomy.Patient has right hemiparesis, dysphagia, and aphasia secondary to large left MCA infarct with hemorrhagic transformation s/p hemicraniectomy. He was first admitted on 03/01/18.  He was discharged from hospital on 04/06/18. He attended OP PT at [facility] and then transitioned to the OP neuro clinic in [place].    Limitations  House hold activities;Walking    How long can you stand comfortably?  no limitations    How long can you walk comfortably?  3-4 blocks a day  (~15 min) 2-3x a day    Patient Stated Goals  wants to get out of R AFO (currently walks around the house  without it)    Currently in Pain?  No/denies         SUBJECTIVE Chief complaint: Pt returns to PT for re-evaluation after a break from therapy due to limited insurance visits.  He has been working via telehealth with [employer] clinic once a week, but finished a few months ago.  He still requires assistance with showering with washing his backside, and has been using a shower chair due to balance.  He does have grab bars in the shower.  He has been following his HEP from [place] as well as his previous PT sessions including walking backwards in the living room, walking side to side, stepping forward with alternating feet, and being on his knees in front of couch with weighted backpack completing trunk rotations.  His dad states that he is starting to get a heel strike with gait with ambulation.  Previous hx: Xavier Villegas had left MCA infarct and subsequent craniectomy.Patient has right hemiparesis, dysphagia, and aphasia secondary to large left MCA infarct with hemorrhagic transformation s/p hemicraniectomy. He was first admitted on 03/01/18.  He was discharged from hospital on 04/06/18. He attended OP PT at [facility] and then transitioned to the OP neuro clinic in [place].   Onset: 02/19/18 Imaging: CT, MRA Recent changes in overall health/medication: No Directional pattern for falls: None Prior history of physical therapy for balance: None Follow-up appointment with MD: botox injections in early October Red flags (bowel/bladder changes, saddle paresthesia, personal history of cancer, chills/fever, night sweats, unrelenting pain) Negative  OBJECTIVE  MUSCULOSKELETAL: Tremor: pill rolling in L hand at rest Bulk: Normal Tone: clonus noted in R ankle with walking  Posture No gross abnormalities noted in standing or seated posture  Gait AFO on RLE and SPC in L hand.  Pt demonstrates a decreased step and stride length, especially with RLE.  Demonstrates decreased R hip flexion, clonus noted  during swing through phase on R, minimal heel strike on the R, and a compensatory weight shift to the L.  Strength R/L 4+/5 Hip flexion 5/5 Hip extension  4/5 Hip abduction (in sitting) 5/5 Hip adduction (in sitting) 4+/5 Knee extension 3/5 Knee flexion 1/5 Ankle Dorsiflexion   NEUROLOGICAL:  Mental Status Patient is oriented to person, place and time.   Attention span and concentration are intact.  Expressive Aphasia is present. Patient's fund of knowledge is within normal limits for educational level based on his limited commnication ability   Sensation Grossly intact with some deficits noted in R ankle and foot. Proprioception of big toe demonstrates 80% accuracy with identifying if toe is up or down.   FUNCTIONAL OUTCOME MEASURES   Results Comments  BERG 50/56 Increased fall risk  DGI 13/24 With SPC, increased fall risk  TUG 11.47 seconds   5TSTS 12.59 seconds  6 Minute Walk Test Deferred to next session   10 Meter Gait Speed self-selected: 13.75s = .44m/s, fastest: 10.88s = .50m/s   Below normative values for full community ambulation    POSTURAL CONTROL TESTS- deferred today              [facility] PT Assessment - 04/27/19 1250      Assessment   Medical Diagnosis  CVA    Referring Provider (PT)  Dr. Naaman Plummer    Onset Date/Surgical Date  02/19/18    Hand Dominance  Right    Next MD Visit  october - botox    Prior Therapy  yes      Precautions   Precautions  Fall    Required Braces or Orthoses  Other Brace/Splint   AFO on R and SPC     Balance Screen   Has the patient fallen in the past 6 months  No    Has the patient had a decrease in activity level because of a fear of falling?   No    Is the patient reluctant to leave their home because of a fear of falling?   No      Home Film/video editor residence    Living Arrangements  Parent    Type of [place] - quad;Tub bench;Grab bars - tub/shower;Wheelchair - manual      Prior Function   Level of Independence  Independent      Cognition   Overall Cognitive Status  Within Functional Limits for tasks assessed   expressive aphasia     Standardized Balance Assessment   Standardized Balance Assessment  Berg Balance Test;Dynamic Gait Index;10 meter walk test;Five Times Sit to Stand;Timed Up and Go Test    Five times sit to stand comments   12.59s    10 Meter Walk  self-selected: 13.75s = .73m/s, fastest: 10.88s = .9m/s  with SPC and AFO.        Berg Balance Test   Sit to Stand  Able to stand without using hands and stabilize independently    Standing Unsupported  Able to stand safely 2 minutes    Sitting with Back Unsupported but Feet Supported on Floor or Stool  Able to sit safely and securely 2 minutes    Stand to Sit  Sits safely with minimal use of hands    Transfers  Able to transfer safely, minor use of hands    Standing Unsupported with Eyes Closed  Able to stand 10 seconds safely    Standing Unsupported with Feet Together  Able to place feet together independently and stand 1 minute safely    From Standing, Reach Forward with Outstretched Arm  Can reach confidently >25 cm (10")    From Standing Position, Pick up Object from Floor  Able to pick up shoe safely and easily    From Standing Position, Turn to Look Behind Over each Shoulder  Looks behind from both sides and weight shifts well    Turn 360 Degrees  Able to turn 360 degrees safely one side only in 4 seconds or less    Standing Unsupported, Alternately Place Feet on Step/Stool  Able to complete >2 steps/needs minimal assist    Standing Unsupported, One Foot in Front  Able to plae foot ahead of the other independently and hold 30  seconds    Standing on One Leg  Able to lift leg independently and hold 5-10 seconds    Total Score  50      Dynamic Gait Index   Level Surface  Mild Impairment    Change in Gait  Speed  Mild Impairment    Gait with Horizontal Head Turns  Normal    Gait with Vertical Head Turns  Normal    Gait and Pivot Turn  Mild Impairment    Step Over Obstacle  Severe Impairment    Step Around Obstacles  Severe Impairment    Steps  Moderate Impairment    Total Score  13    DGI comment:  no AD                 PT Short Term Goals - 04/27/19 1240      PT SHORT TERM GOAL #1   Title  Pt will be independent with HEP in order to improve strength and balance in order to decrease fall risk and improve function at home.    Time  6    Period  Weeks    Status  New    Target Date  06/07/19        PT Long Term Goals - 04/27/19 1241      PT LONG TERM GOAL #1   Title  Pt will improve BERG by at least 3 points in order to demonstrate clinically significant improvement in balance.    Baseline  04/26/19: 50/56    Time  12    Period  Weeks    Status  New    Target Date  07/19/19      PT LONG TERM GOAL #2   Title  Pt will improve DGI by at least 3 points in order to demonstrate clinically significant improvement in balance and decreased risk for falls.    Baseline  04/26/19: 13/24    Time  12    Period  Weeks    Status  New    Target Date  07/19/19      PT LONG TERM GOAL #3   Title  Pt will improve gait speed to 1.2 m/s or greater in order to meet the normative values for community ambulation and to decrease his fall risk.    Baseline  04/26/19: self-selected: 13.75s = .5m/s, fastest: 10.88s = .25m/s    Time  12    Period  Weeks    Status  New    Target Date  07/20/19             Plan - 04/26/19 1502    Clinical Impression Statement  Pt is a pleasant 37 year-old male referred for difficulty with balance. PT examination reveals deficits with the BERG balance test, scoring a 50/56, demonstrating difficulty with turning 360, maintaining single leg stance and gaining adequate hip flexion during toe taps onto a 6" box, and balancing in tandem and single leg stance.   He scored a 13/24 on the Dynamic Gait Index, displaying difficulty with gait speed, changes in gait speed, turning and stopping quickly and safety, stepping over and around obstacles, and going up and down the stairs in a non reciprocal stepping pattern requiring the use of the hand rail.  He completed the 5xSTS in 12.59 seconds today, and completed the TUG in 11.47 seconds with a SPC.  His self selected gait speed is .5m/s and his fastest gait speed is .73m/s with a SPC, both falling below  the normative values for community ambulation.  Pt presents with deficits in strength, gait and balance. Pt will benefit from skilled PT services to address deficits in balance and decrease risk for future falls.    Personal Factors and Comorbidities  Time since onset of injury/illness/exacerbation    Examination-Activity Limitations  Bathing;Stairs;Squat;Dressing;Caring for Others;Carry;Sit    Examination-Participation Restrictions  Community Activity;Driving    Stability/Clinical Decision Making  Evolving/Moderate complexity    Clinical Decision Making  Moderate    Rehab Potential  Good    PT Frequency  1x / week    PT Duration  12 weeks    PT Treatment/Interventions  ADLs/Self Care Home Management;Cryotherapy;Electrical Stimulation;Canalith Repostioning;Moist Heat;Biofeedback;Ultrasound;DME Instruction;Gait training;Therapeutic exercise;Therapeutic activities;Functional mobility training;Stair training;Balance training;Neuromuscular re-education;Patient/family education;Manual techniques;Dry needling;Energy conservation;Spinal Manipulations;Joint Manipulations;Vestibular    PT Next Visit Plan  6MWT; review and modify existing HEP    PT Home Exercise Plan  will review next session    Consulted and Agree with Plan of Care  Patient;Family member/caregiver    Family Member Consulted  father       Patient will benefit from skilled therapeutic intervention in order to improve the following deficits and impairments:   Abnormal gait, Decreased balance, Decreased endurance, Decreased activity tolerance, Decreased coordination, Decreased strength, Impaired UE functional use  Visit Diagnosis: Unsteadiness on feet  Muscle weakness (generalized)  Other abnormalities of gait and mobility  Other lack of coordination     Problem List Patient Active Problem List   Diagnosis Date Noted  . CVA (cerebral vascular accident) ([place]) 05/02/2018  . Neurogenic bowel   . Neurogenic bladder   . Spastic hemiparesis ([place])   . 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 Cerebral infarction due to embolism of left middle cerebral artery (HCC) s/p thrombectomy 03/01/2018  . Carotid artery dissection ([place]) Left 03/01/2018  . Acute ischemic right MCA stroke ([place]) 03/01/2018  . Right hemiparesis ([place])   . Dysphagia, post-stroke   . Global aphasia   . Leukocytosis   . Folic acid deficiency [id number]  . B12 deficiency 02/23/2018  . Hyperhomocysteinemia ([place]) 02/23/2018  . Smoker     This entire session was performed under direct supervision and direction of a licensed Chiropractor . I have personally read, edited and approve of the note as written.   Lutricia Horsfall, SPT [facility] PT, DPT, GCS  Huprich,Jason 04/27/2019, 5:24 PM  [place] Villegas [facility] SERVICES [address] [place], [place], [address] Phone: [phone]   Fax:  [phone]  Name: Breyton Sepanski MRN: [id number] Date of Birth: [birth year]

## 2019-05-02 ENCOUNTER — Other Ambulatory Visit: Payer: Self-pay

## 2019-05-02 ENCOUNTER — Ambulatory Visit: Payer: BC Managed Care – PPO

## 2019-05-02 DIAGNOSIS — R2681 Unsteadiness on feet: Secondary | ICD-10-CM

## 2019-05-02 DIAGNOSIS — R278 Other lack of coordination: Secondary | ICD-10-CM

## 2019-05-02 DIAGNOSIS — R2689 Other abnormalities of gait and mobility: Secondary | ICD-10-CM | POA: Diagnosis not present

## 2019-05-02 DIAGNOSIS — M6281 Muscle weakness (generalized): Secondary | ICD-10-CM | POA: Diagnosis not present

## 2019-05-02 NOTE — Therapy (Signed)
[facility] [address] [place], [place], [address] Phone: [phone]   Fax:  [phone]  Physical Therapy Treatment  Patient Details  Name: [facility] MRN: [id number] Date of Birth: Jul 07, 1981 Referring Provider (PT): Dr. Naaman Plummer   Encounter Date: 05/02/2019  PT End of Session - 05/02/19 1215    Visit Number  2    Number of Visits  13    Date for PT Re-Evaluation  07/19/19    PT Start Time  0802    PT Stop Time  0845    PT Time Calculation (min)  43 min    Equipment Utilized During Treatment  Gait belt;Other (comment)   R AFO and SPC   Activity Tolerance  Patient tolerated treatment well    Behavior During Therapy  WFL for tasks assessed/performed       Past Medical History:  Diagnosis Date  . Aphasia   . GERD (gastroesophageal reflux disease)   . Hemiparesis ([place]) 02/2018   right side  . Hyperhomocysteinemia ([place])   . Neurogenic bladder 02/2018   04/26/18 inporving  . Spastic hemiparesis (HCC)    right side  . Stroke ([place])    Left MCA infart  . Tobacco abuse     Past Surgical History:  Procedure Laterality Date  . CRANIOTOMY Left 02/21/2018   Procedure: DECOMPRESSIVE CRANIECTOMY with bone flap to abdomen;  Surgeon: [name], MD;  Location: [place] [facility];  Service: Neurosurgery;  Laterality: Left;  DECOMPRESSIVE CRANIECTOMY with bone flap to abdomen  . CRANIOTOMY N/A 05/02/2018   Procedure: RE-IMPLANTATION OF CRANIAL FLAP;  Surgeon: [name], MD;  Location: [facility] [place] [facility];  Service: Neurosurgery;  Laterality: N/A;  RE-IMPLANTATION OF CRANIAL FLAP  . IR ANGIO INTRA EXTRACRAN SEL COM CAROTID INNOMINATE UNI R MOD SED  02/19/2018  . IR ANGIO VERTEBRAL SEL VERTEBRAL BILAT MOD SED  02/19/2018  . IR CT HEAD LTD  02/19/2018  . IR PERCUTANEOUS ART THROMBECTOMY/INFUSION INTRACRANIAL INC DIAG ANGIO  02/19/2018  . IR US GUIDE VASC ACCESS RIGHT  02/19/2018  . RADIOLOGY WITH ANESTHESIA N/A 02/19/2018   Procedure: IR WITH  ANESTHESIA CODE STROKE;  Surgeon: Corrie Mckusick, DO;  Location: [place];  Service: Anesthesiology;  Laterality: N/A;  . TEE WITHOUT CARDIOVERSION N/A 03/01/2018   Procedure: TRANSESOPHAGEAL ECHOCARDIOGRAM (TEE);  Surgeon: Sanda Klein, MD;  [facility];  Service: Cardiovascular;  Laterality: N/A;    There were no vitals filed for this visit.  Subjective Assessment - 05/02/19 1214    Subjective  Pt is doing well today.  He endorses no soreness or pain after last session.  He reports no falls since last visit.  No new questions or concerns at this time.    Patient is accompained by:  Family member    Pertinent History  Pt returns to PT for re-evaluation after a break from therapy due to limited insurance visits.  He has been working via telehealth with [employer] clinic once a week, but finished a few months ago.  He still requires assistance with showering with washing his backside, and has been using a shower chair due to balance.  He does have grab bars in the shower.  He has been following his HEP from [place] as well as his previous PT sessions including walking backwards in the living room, walking side to side, stepping forward with alternating feet, and being on his knees in front of couch with weighted backpack completing trunk rotations.  His  dad states that he is starting to get a heel strike with gait with ambulation.  Previous hx: Xavier [facility] had left MCA infarct and subsequent craniectomy.Patient has right hemiparesis, dysphagia, and aphasia secondary to large left MCA infarct with hemorrhagic transformation s/p hemicraniectomy. He was first admitted on 03/01/18.  He was discharged from [facility] on 04/06/18. He attended OP PT at [facility] and then transitioned to the OP neuro clinic in [place].    Limitations  House hold activities;Walking    How long can you stand comfortably?  no limitations    How long can you walk comfortably?  3-4 blocks a day  (~15 min) 2-3x a day    Patient  Stated Goals  wants to get out of R AFO (currently walks around the house without it)    Currently in Pain?  No/denies         [facility] PT Assessment - 05/02/19 0832      6 Minute Walk- Baseline   6 Minute Walk- Baseline  yes    BP (mmHg)  135/82    HR (bpm)  87    02 Sat (%RA)  95 %    Modified Borg Scale for Dyspnea  --   Aphasia, unable to rate   Perceived Rate of Exertion (Borg)  --   Aphasia, unable to rate     6 Minute walk- Post Test   6 Minute Walk Post Test  yes    BP (mmHg)  137/89    HR (bpm)  98    02 Sat (%RA)  98 %    Modified Borg Scale for Dyspnea  3- Moderate shortness of breath or breathing difficulty    Perceived Rate of Exertion (Borg)  9- very light      6 minute walk test results    Aerobic Endurance Distance Walked  925         TREATMENT:  There-ex:  6MWT: 26' with SPC  Reviewed and revised HEP: Supine Bridges with LLE in front of RLE to bias R leg x10 R Straight Leg Raise x10 with a 3 sec hold Sit to Stand with LLE on 5" step to bias RLE x10 Lunging in place with LLE behind x10 Seated hamstring stretch 2x30s Side Stepping in // bars x3 lengths  in each direction Standing Tandem Balance in // bars with RLE behind, with cueing to keep R heel down 2x30s Tandem forward walking in // bars x3 lengths Retrogait in // bars x3 lengths   Pt educated throughout session about proper posture and technique with exercises. Improved exercise technique, movement at target joints, use of target muscles after min to mod verbal, visual, tactile cues.     Pt demonstrated excellent motivation throughout today's session.  He was able to walk 925' with a SPC during his 6MWT today.  He has a tendency to favor his LLE during sit to stands and supine bridging, but he was able to complete both exercises with difficulty after elevating his LLE on a step during sit to stands and sliding his LLE forward during supine bridging.  He demonstrates a decreased step length on  the RLE with retrogait.  Pt will continue to benefit from skilled PT services to address deficits in strength, balance, and mobility in order to return to full function at home.               PT Short Term Goals - 04/27/19 1240      PT SHORT TERM GOAL #1  Title  Pt will be independent with HEP in order to improve strength and balance in order to decrease fall risk and improve function at home.    Time  6    Period  Weeks    Status  New    Target Date  06/07/19        PT Long Term Goals - 05/02/19 1220      PT LONG TERM GOAL #1   Title  Pt will improve BERG by at least 3 points in order to demonstrate clinically significant improvement in balance.    Baseline  04/26/19: 50/56    Time  12    Period  Weeks    Status  New    Target Date  07/19/19      PT LONG TERM GOAL #2   Title  Pt will improve DGI by at least 3 points in order to demonstrate clinically significant improvement in balance and decreased risk for falls.    Baseline  04/26/19: 13/24    Time  12    Period  Weeks    Status  New    Target Date  07/19/19      PT LONG TERM GOAL #3   Title  Pt will improve gait speed to 1.2 m/s or greater in order to meet the normative values for community ambulation and to decrease his fall risk.    Baseline  04/26/19: self-selected: 13.75s = .37m/s, fastest: 10.88s = .34m/s    Time  12    Period  Weeks    Status  New    Target Date  07/20/19      PT LONG TERM GOAL #4   Title  Pt will increase 6MWT by at least 85m (1101ft) in order to demonstrate clinically significant improvement in cardiopulmonary endurance and community ambulation    Baseline  05/02/19: 925' with SPC    Time  12    Period  Weeks    Status  New    Target Date  07/20/19            Plan - 05/02/19 1231    Clinical Impression Statement  Pt demonstrated excellent motivation throughout today's session.  He was able to walk 925' with a SPC during his 6MWT today.  He has a tendency to favor his LLE  during sit to stands and supine bridging, but he was able to complete both exercises with difficulty after elevating his LLE on a step during sit to stands and sliding his LLE forward during supine bridging.  He demonstrates a decreased step length on the RLE with retrogait.  Pt will continue to benefit from skilled PT services to address deficits in strength, balance, and mobility in order to return to full function at home.    Personal Factors and Comorbidities  Time since onset of injury/illness/exacerbation    Examination-Activity Limitations  Bathing;Stairs;Squat;Dressing;Caring for Others;Carry;Sit    Examination-Participation Restrictions  Community Activity;Driving    Stability/Clinical Decision Making  Evolving/Moderate complexity    Rehab Potential  Good    PT Frequency  1x / week    PT Duration  12 weeks    PT Treatment/Interventions  ADLs/Self Care Home Management;Cryotherapy;Electrical Stimulation;Canalith Repostioning;Moist Heat;Biofeedback;Ultrasound;DME Instruction;Gait training;Therapeutic exercise;Therapeutic activities;Functional mobility training;Stair training;Balance training;Neuromuscular re-education;Patient/family education;Manual techniques;Dry needling;Energy conservation;Spinal Manipulations;Joint Manipulations;Vestibular    PT Next Visit Plan  review and modify HEP    PT Home Exercise Plan  [email or web address]    Consulted and Agree with Plan of Care  Patient;Family member/caregiver  Family Member Consulted  father       Patient will benefit from skilled therapeutic intervention in order to improve the following deficits and impairments:  Abnormal gait, Decreased balance, Decreased endurance, Decreased activity tolerance, Decreased coordination, Decreased strength, Impaired UE functional use  Visit Diagnosis: Unsteadiness on feet  Muscle weakness (generalized)  Other abnormalities of gait and mobility  Other lack of coordination     Problem List Patient Active  Problem List   Diagnosis Date Noted  . CVA (cerebral vascular accident) ([place]) 05/02/2018  . Neurogenic bowel   . Neurogenic bladder   . Spastic hemiparesis ([place])   . 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 Cerebral infarction due to embolism of left middle cerebral artery (HCC) s/p thrombectomy 03/01/2018  . Carotid artery dissection ([place]) Left 03/01/2018  . Acute ischemic right MCA stroke ([place]) 03/01/2018  . Right hemiparesis ([place])   . Dysphagia, post-stroke   . Global aphasia   . Leukocytosis   . Folic acid deficiency 123XX123  . B12 deficiency 02/23/2018  . Hyperhomocysteinemia ([place]) 02/23/2018  . Smoker     This entire session was performed under direct supervision and direction of a licensed Chiropractor . I have personally read, edited and approve of the note as written.   Lutricia Horsfall, SPT [facility] PT, DPT, GCS  Huprich,Jason 05/02/2019, 1:47 PM  [place] MAIN [facility] SERVICES [address] [place], [place], [address] Phone: [phone]   Fax:  [phone]  Name: [facility] MRN: [id number] Date of Birth: 09-Aug-1981

## 2019-05-02 NOTE — Patient Instructions (Signed)
Access Code: [email or web address]  URL: [email or web address]  Date: 05/02/2019  Prepared by: Roxana Hires   Exercises Supine Bridge - 10 reps - 2 sets - 3s hold - 1x daily - 4x weekly Straight Leg Raise - 10 reps - 2 sets - 3s hold - 1x daily - 4x weekly Sit to Stand in Stride with AFO and UE Assist in LE Alignment - 10 reps - 2 sets - 1x daily - 4x weekly Trail Leg Lunge - 10 reps - 3 sets - 1x daily - 4x weekly seated hamstring stretch - 3 reps - 30s hold - 1x daily - 4x weekly Side Stepping with Counter Support - 3 reps - 1 sets - 1x daily - 4x weekly Standing Tandem Balance with Counter Support - 3 reps - 30 hold - 1x daily - 4x weekly Tandem Walking with Counter Support - 5 reps - 3 sets - 1x daily - 4x weekly Backwards Walking - 5 reps - 3 sets - 1x daily - 4x weekly

## 2019-05-08 ENCOUNTER — Ambulatory Visit: Payer: BC Managed Care – PPO

## 2019-05-10 ENCOUNTER — Other Ambulatory Visit: Payer: Self-pay

## 2019-05-10 ENCOUNTER — Ambulatory Visit: Payer: BC Managed Care – PPO

## 2019-05-10 DIAGNOSIS — R2681 Unsteadiness on feet: Secondary | ICD-10-CM | POA: Diagnosis not present

## 2019-05-10 DIAGNOSIS — M6281 Muscle weakness (generalized): Secondary | ICD-10-CM

## 2019-05-10 DIAGNOSIS — R278 Other lack of coordination: Secondary | ICD-10-CM

## 2019-05-10 DIAGNOSIS — R2689 Other abnormalities of gait and mobility: Secondary | ICD-10-CM | POA: Diagnosis not present

## 2019-05-10 NOTE — Therapy (Addendum)
[place] MAIN [facility] SERVICES [address] [place], [place], [address] Phone: [phone]   Fax:  [phone]  Physical Therapy Treatment  Patient Details  Name: [facility] MRN: [id number] Date of Birth: 1980-09-13 Referring Provider (PT): Dr. Naaman [facility]   Encounter Date: 05/10/2019  PT End of Session - 05/10/19 0755    Visit Number  3    Number of Visits  13    Date for PT Re-Evaluation  07/19/19    PT Start Time  0800    PT Stop Time  0845    PT Time Calculation (min)  45 min    Equipment Utilized During Treatment  Gait belt;Other (comment)   R AFO and SPC   Activity Tolerance  Patient tolerated treatment well    Behavior During Therapy  WFL for tasks assessed/performed       Past Medical History:  Diagnosis Date  . Aphasia   . GERD (gastroesophageal reflux disease)   . Hemiparesis ([place]) 02/2018   right side  . Hyperhomocysteinemia ([place])   . Neurogenic bladder 02/2018   04/26/18 inporving  . Spastic hemiparesis (HCC)    right side  . Stroke ([place])    Left MCA infart  . Tobacco abuse     Past Surgical History:  Procedure Laterality Date  . CRANIOTOMY Left 02/21/2018   Procedure: DECOMPRESSIVE CRANIECTOMY with bone flap to abdomen;  Surgeon: [name], MD;  Location: [place];  Service: Neurosurgery;  Laterality: Left;  DECOMPRESSIVE CRANIECTOMY with bone flap to abdomen  . CRANIOTOMY N/A 05/02/2018   Procedure: RE-IMPLANTATION OF CRANIAL FLAP;  Surgeon: [name], MD;  Location: [place];  Service: Neurosurgery;  Laterality: N/A;  RE-IMPLANTATION OF CRANIAL FLAP  . IR ANGIO INTRA EXTRACRAN SEL COM CAROTID INNOMINATE UNI R MOD SED  02/19/2018  . IR ANGIO VERTEBRAL SEL VERTEBRAL BILAT MOD SED  02/19/2018  . IR CT HEAD LTD  02/19/2018  . IR PERCUTANEOUS ART THROMBECTOMY/INFUSION INTRACRANIAL INC DIAG ANGIO  02/19/2018  . IR US GUIDE VASC ACCESS RIGHT  02/19/2018  . RADIOLOGY WITH ANESTHESIA N/A 02/19/2018   Procedure: IR WITH  ANESTHESIA CODE STROKE;  Surgeon: Corrie Mckusick, DO;  Location: [facility];  Service: Anesthesiology;  Laterality: N/A;  . TEE WITHOUT CARDIOVERSION N/A 03/01/2018   Procedure: TRANSESOPHAGEAL ECHOCARDIOGRAM (TEE);  Surgeon: Sanda Klein, MD;  Location: [facility];  Service: Cardiovascular;  Laterality: N/A;    There were no vitals filed for this visit.  Subjective Assessment - 05/10/19 0849    Subjective  Pt is doing well today.  He endorses no soreness or pain after last session.  He reports that his HEP is going well.  No new questions or concerns at this time.    Patient is accompained by:  Family member    Pertinent History  Pt returns to PT for re-evaluation after a break from therapy due to limited insurance visits.  He has been working via telehealth with [employer] clinic once a week, but finished a few months ago.  He still requires assistance with showering with washing his backside, and has been using a shower chair due to balance.  He does have grab bars in the shower.  He has been following his HEP from [place] as well as his previous PT sessions including walking backwards in the living room, walking side to side, stepping forward with alternating feet, and being on his knees in front of couch with weighted backpack completing trunk rotations.  His dad states that he is starting to get a heel strike with gait with ambulation.  Previous hx: [name] had left MCA infarct and subsequent craniectomy.Patient has right hemiparesis, dysphagia, and aphasia secondary to large left MCA infarct with hemorrhagic transformation s/p hemicraniectomy. He was first admitted on 03/01/18.  He was discharged from hospital on 04/06/18. He attended OP PT at [facility] and then transitioned to the OP neuro clinic in [place].    Limitations  House hold activities;Walking    How long can you stand comfortably?  no limitations    How long can you walk comfortably?  3-4 blocks a day  (~15 min) 2-3x a day    Patient  Stated Goals  wants to get out of R AFO (currently walks around the house without it)    Currently in Pain?  No/denies        TREATMENT:   Ther-ex   STSs with foot on LLE on dynadisc and airex pad in standard chair x10.  Displays difficulty coming to stand on 1st rep of about 60% of reps.  Tendency to favor RLE.  CGA with occassional minA throughout  R Leg press 75# x20, 90# x10  Side steps with green tband x3 lengths of // bars in each direction    Neuromuscular Re-education   Heel to toe rockers on rocker board x10 in each direction   Weighted trunk rotations with Matrix machine in 1/2 kneeling with L knee on airex pad - difficulty with coordinationg trunk rotation  2x8  R SL balance on airex with SUE on // bars with 1/2 ball taps x10  R SL balance on even ground without UE support with 1/2 ball taps x10; requires mod(A) for steadying throughout  Forward semitandem gait in // bars on airex balance beam x8   Electrical Stimulation  NMES to R wrist/finger extensors x 10 minutes at pt tolerated intensity of 30. No skin irritation noted upon removal of electrodes. Good strong R wrist extension contraction achieved;   Pt educated throughout session about proper posture and technique with exercises. Improved exercise technique, movement at target joints, use of target muscles after min to mod verbal, visual, tactile cues.     Pt demonstrated excellent motivation throughout today's session.  He demonstrates difficulty with motor control and coordination while maintaining a 1/2 kneeling position, with an inability to get into the tall kneeling position.  He demonstrates difficulty with R SL balance, requiring UE support to maintain balance during toe tapping.  With STSs, he has a tendency to favor his LLE, with the inability to come to stand from a standard chair without UE support and his LLE on a dynadisc today. Long discussion today with father regarding possible dynamic  splints for RUE. Pt will continue to benefit from skilled PT services to address deficits in strength, balance, and mobility in order to return to full function at home.                PT Short Term Goals - 04/27/19 1240      PT SHORT TERM GOAL #1   Title  Pt will be independent with HEP in order to improve strength and balance in order to decrease fall risk and improve function at home.    Time  6    Period  Weeks    Status  New    Target Date  06/07/19        PT Long Term Goals - 05/02/19 1220  PT LONG TERM GOAL #1   Title  Pt will improve BERG by at least 3 points in order to demonstrate clinically significant improvement in balance.    Baseline  04/26/19: 50/56    Time  12    Period  Weeks    Status  New    Target Date  07/19/19      PT LONG TERM GOAL #2   Title  Pt will improve DGI by at least 3 points in order to demonstrate clinically significant improvement in balance and decreased risk for falls.    Baseline  04/26/19: 13/24    Time  12    Period  Weeks    Status  New    Target Date  07/19/19      PT LONG TERM GOAL #3   Title  Pt will improve gait speed to 1.2 m/s or greater in order to meet the normative values for community ambulation and to decrease his fall risk.    Baseline  04/26/19: self-selected: 13.75s = .47m/s, fastest: 10.88s = .71m/s    Time  12    Period  Weeks    Status  New    Target Date  07/20/19      PT LONG TERM GOAL #4   Title  Pt will increase 6MWT by at least 37m (142ft) in order to demonstrate clinically significant improvement in cardiopulmonary endurance and community ambulation    Baseline  05/02/19: 925' with SPC    Time  12    Period  Weeks    Status  New    Target Date  07/20/19            Plan - 05/10/19 1022    Clinical Impression Statement  Pt demonstrated excellent motivation throughout today's session.  He demonstrates difficulty with motor control and coordination while maintaining a 1/2 kneeling  position, with an inability to get into the tall kneeling position.  He demonstrates difficulty with R SL balance, requiring UE support to maintain balance during toe tapping.  With STSs, he has a tendency to favor his LLE, with the inability to come to stand from a standard chair without UE support and his LLE on a dynadisc today. Long discussion today with father regarding possible dynamic splints for RUE. Pt will continue to benefit from skilled PT services to address deficits in strength, balance, and mobility in order to return to full function at home.    Personal Factors and Comorbidities  Time since onset of injury/illness/exacerbation    Examination-Activity Limitations  Bathing;Stairs;Squat;Dressing;Caring for Others;Carry;Sit    Examination-Participation Restrictions  Community Activity;Driving    Stability/Clinical Decision Making  Evolving/Moderate complexity    Rehab Potential  Good    PT Frequency  1x / week    PT Duration  12 weeks    PT Treatment/Interventions  ADLs/Self Care Home Management;Cryotherapy;Electrical Stimulation;Canalith Repostioning;Moist Heat;Biofeedback;Ultrasound;DME Instruction;Gait training;Therapeutic exercise;Therapeutic activities;Functional mobility training;Stair training;Balance training;Neuromuscular re-education;Patient/family education;Manual techniques;Dry needling;Energy conservation;Spinal Manipulations;Joint Manipulations;Vestibular    PT Next Visit Plan  review and modify HEP    PT Home Exercise Plan  [email or web address]    Consulted and Agree with Plan of Care  Patient;Family member/caregiver    Family Member Consulted  father       Patient will benefit from skilled therapeutic intervention in order to improve the following deficits and impairments:  Abnormal gait, Decreased balance, Decreased endurance, Decreased activity tolerance, Decreased coordination, Decreased strength, Impaired UE functional use  Visit Diagnosis: Unsteadiness on feet  Muscle  weakness (generalized)  Other abnormalities of gait and mobility  Other lack of coordination     Problem List Patient Active Problem List   Diagnosis Date Noted  . CVA (cerebral vascular accident) ([place]) 05/02/2018  . Neurogenic bowel   . Neurogenic bladder   . Spastic hemiparesis ([place])   . 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 Cerebral infarction due to embolism of left middle cerebral artery (HCC) s/p thrombectomy 03/01/2018  . Carotid artery dissection ([place]) Left 03/01/2018  . Acute ischemic right MCA stroke ([place]) 03/01/2018  . Right hemiparesis ([place])   . Dysphagia, post-stroke   . Global aphasia   . Leukocytosis   . Folic acid deficiency 123XX123  . B12 deficiency 02/23/2018  . Hyperhomocysteinemia ([place]) 02/23/2018  . Smoker     This entire session was performed under direct supervision and direction of a licensed Chiropractor . I have personally read, edited and approve of the note as written.   Lutricia Horsfall, SPT [facility] PT, DPT, GCS  [facility],X[facility] 05/11/2019, 8:10 AM  [place] MAIN [facility] SERVICES [address] [place], [place], [address] Phone: [phone]   Fax:  [phone]  Name: Xavier [facility] MRN: [id number] Date of Birth: [birth year]

## 2019-05-15 ENCOUNTER — Ambulatory Visit: Payer: BC Managed Care – PPO

## 2019-05-17 ENCOUNTER — Ambulatory Visit: Payer: BC Managed Care – PPO | Attending: Physical Medicine & Rehabilitation

## 2019-05-17 ENCOUNTER — Other Ambulatory Visit: Payer: Self-pay

## 2019-05-17 DIAGNOSIS — R2681 Unsteadiness on feet: Secondary | ICD-10-CM | POA: Diagnosis not present

## 2019-05-17 DIAGNOSIS — R482 Apraxia: Secondary | ICD-10-CM | POA: Diagnosis not present

## 2019-05-17 DIAGNOSIS — M6281 Muscle weakness (generalized): Secondary | ICD-10-CM | POA: Diagnosis not present

## 2019-05-17 NOTE — Therapy (Signed)
[place] MAIN [facility] SERVICES [address] [place], [place], [address] Phone: [phone]   Fax:  [phone]  Physical Therapy Treatment  Patient Details  Name: Caio Kilmer MRN: [id number] Date of Birth: Jan 11, 1981 Referring Provider (PT): Dr. Naaman Plummer   Encounter Date: 05/17/2019  PT End of Session - 05/17/19 0903    Visit Number  4    Number of Visits  13    Date for PT Re-Evaluation  07/19/19    PT Start Time  0847    PT Stop Time  0945    PT Time Calculation (min)  58 min    Equipment Utilized During Treatment  Gait belt;Other (comment)   R AFO and SPC   Activity Tolerance  Patient tolerated treatment well    Behavior During Therapy  WFL for tasks assessed/performed       Past Medical History:  Diagnosis Date  . Aphasia   . GERD (gastroesophageal reflux disease)   . Hemiparesis ([place]) 02/2018   right side  . Hyperhomocysteinemia ([place])   . Neurogenic bladder 02/2018   04/26/18 inporving  . Spastic hemiparesis (HCC)    right side  . Stroke ([place])    Left MCA infart  . Tobacco abuse     Past Surgical History:  Procedure Laterality Date  . CRANIOTOMY Left 02/21/2018   Procedure: DECOMPRESSIVE CRANIECTOMY with bone flap to abdomen;  Surgeon: [name], MD;  Location: [facility];  Service: Neurosurgery;  Laterality: Left;  DECOMPRESSIVE CRANIECTOMY with bone flap to abdomen  . CRANIOTOMY N/A 05/02/2018   Procedure: RE-IMPLANTATION OF CRANIAL FLAP;  Surgeon: [name], MD;  Location: [facility];  Service: Neurosurgery;  Laterality: N/A;  RE-IMPLANTATION OF CRANIAL FLAP  . IR ANGIO INTRA EXTRACRAN SEL COM CAROTID INNOMINATE UNI R MOD SED  02/19/2018  . IR ANGIO VERTEBRAL SEL VERTEBRAL BILAT MOD SED  02/19/2018  . IR CT HEAD LTD  02/19/2018  . IR PERCUTANEOUS ART THROMBECTOMY/INFUSION INTRACRANIAL INC DIAG ANGIO  02/19/2018  . IR US GUIDE VASC ACCESS RIGHT  02/19/2018  . RADIOLOGY WITH ANESTHESIA N/A 02/19/2018   Procedure: IR WITH  ANESTHESIA CODE STROKE;  Surgeon: Corrie Mckusick, DO;  Location: [facility];  Service: Anesthesiology;  Laterality: N/A;  . TEE WITHOUT CARDIOVERSION N/A 03/01/2018   Procedure: TRANSESOPHAGEAL ECHOCARDIOGRAM (TEE);  Surgeon: Sanda Klein, MD;  Location: [facility] ENDOSCOPY;  Service: Cardiovascular;  Laterality: N/A;    There were no vitals filed for this visit.  Subjective Assessment - 05/17/19 0858    Subjective  Pt is doing well today.  He endorses no soreness or pain after last session.  He reports that his HEP is going well.  No new questions or concerns at this time.    Patient is accompained by:  Family member    Pertinent History  Pt returns to PT for re-evaluation after a break from therapy due to limited insurance visits.  He has been working via telehealth with [employer] clinic once a week, but finished a few months ago.  He still requires assistance with showering with washing his backside, and has been using a shower chair due to balance.  He does have grab bars in the shower.  He has been following his HEP from [facility] as well as his previous PT sessions including walking backwards in the living room, walking side to side, stepping forward with alternating feet, and being on his knees in front of couch with weighted backpack completing trunk rotations.  His dad states that he is starting to get a heel strike with gait with ambulation.  Previous hx: Mr. Duchesneau had left MCA infarct and subsequent craniectomy.Patient has right hemiparesis, dysphagia, and aphasia secondary to large left MCA infarct with hemorrhagic transformation s/p hemicraniectomy. He was first admitted on 03/01/18.  He was discharged from hospital on 04/06/18. He attended OP PT at [facility] and then transitioned to the OP neuro clinic in [place].    Limitations  House hold activities;Walking    How long can you stand comfortably?  no limitations    How long can you walk comfortably?  3-4 blocks a day  (~15 min) 2-3x a day    Patient  Stated Goals  wants to get out of R AFO (currently walks around the house without it)    Currently in Pain?  No/denies         TREATMENT   Ther-ex  STSs with foot on LLE on dynadisc in standard chair without UE support 2 x 10, tendency to favor RLE.  CGA with occassional minA throughout; Side steps with green tband in // bars without UE support x 6 lengths; Lateral squat walks in // bars without UE support x 4 lengths;  Standing R TKE with ball against wall, 5s hold 2 x 10; R Leg press 80# x20, 95# x10, 110# x 10; Attempted R single leg sit to stand from elevated mat table however pt is unable to perform whithout attempting to push with LUE and place LLE on the ground;   Electrical Stimulation  NMES to R wrist/finger extensors x 10 minutes at pt tolerated intensity of 33. No skin irritation noted upon removal of electrodes. Good strong R wrist extension contraction achieved;   Pt educated throughout session about proper posture and technique with exercises. Improved exercise technique, movement at target joints, use of target muscles after min to mod verbal, visual, tactile cues.   Pt demonstrated excellent motivation throughout today's session. He is able to increase resistance considerably on leg press today. He also demonstrates the ability to avoid R knee hyperextension by performing lateral squat walks. Worked on terminal knee extension strength with ball press against wall. Attempted R single leg sit to stand from elevated mat table however pt is unable to perform whithout attempting to push with LUE and place LLE on the ground. Utilized NMES again today for R wrist/finger extensor strengthening. Pt will continue to benefit from skilled PT services to address deficits in strength, balance, and mobility in order to return to full function at home.                         PT Short Term Goals - 04/27/19 1240      PT SHORT TERM GOAL #1   Title  Pt  will be independent with HEP in order to improve strength and balance in order to decrease fall risk and improve function at home.    Time  6    Period  Weeks    Status  New    Target Date  06/07/19        PT Long Term Goals - 05/02/19 1220      PT LONG TERM GOAL #1   Title  Pt will improve BERG by at least 3 points in order to demonstrate clinically significant improvement in balance.    Baseline  04/26/19: 50/56    Time  12    Period  Weeks  Status  New    Target Date  07/19/19      PT LONG TERM GOAL #2   Title  Pt will improve DGI by at least 3 points in order to demonstrate clinically significant improvement in balance and decreased risk for falls.    Baseline  04/26/19: 13/24    Time  12    Period  Weeks    Status  New    Target Date  07/19/19      PT LONG TERM GOAL #3   Title  Pt will improve gait speed to 1.2 m/s or greater in order to meet the normative values for community ambulation and to decrease his fall risk.    Baseline  04/26/19: self-selected: 13.75s = .73m/s, fastest: 10.88s = .41m/s    Time  12    Period  Weeks    Status  New    Target Date  07/20/19      PT LONG TERM GOAL #4   Title  Pt will increase 6MWT by at least 37m (185ft) in order to demonstrate clinically significant improvement in cardiopulmonary endurance and community ambulation    Baseline  05/02/19: 925' with SPC    Time  12    Period  Weeks    Status  New    Target Date  07/20/19            Plan - 05/17/19 0904    Clinical Impression Statement  Pt demonstrated excellent motivation throughout today's session. He is able to increase resistance considerably on leg press today. He also demonstrates the ability to avoid R knee hyperextension by performing lateral squat walks. Worked on terminal knee extension strength with ball press against wall. Attempted R single leg sit to stand from elevated mat table however pt is unable to perform whithout attempting to push with LUE and place LLE  on the ground. Utilized NMES again today for R wrist/finger extensor strengthening.  Pt will continue to benefit from skilled PT services to address deficits in strength, balance, and mobility in order to return to full function at home.    Personal Factors and Comorbidities  Time since onset of injury/illness/exacerbation    Examination-Activity Limitations  Bathing;Stairs;Squat;Dressing;Caring for Others;Carry;Sit    Examination-Participation Restrictions  Community Activity;Driving    Stability/Clinical Decision Making  Evolving/Moderate complexity    Rehab Potential  Good    PT Frequency  1x / week    PT Duration  12 weeks    PT Treatment/Interventions  ADLs/Self Care Home Management;Cryotherapy;Electrical Stimulation;Canalith Repostioning;Moist Heat;Biofeedback;Ultrasound;DME Instruction;Gait training;Therapeutic exercise;Therapeutic activities;Functional mobility training;Stair training;Balance training;Neuromuscular re-education;Patient/family education;Manual techniques;Dry needling;Energy conservation;Spinal Manipulations;Joint Manipulations;Vestibular    PT Next Visit Plan  review and modify HEP    PT Home Exercise Plan  [id number]    Consulted and Agree with Plan of Care  Patient;Family member/caregiver    Family Member Consulted  father       Patient will benefit from skilled therapeutic intervention in order to improve the following deficits and impairments:  Abnormal gait, Decreased balance, Decreased endurance, Decreased activity tolerance, Decreased coordination, Decreased strength, Impaired UE functional use  Visit Diagnosis: Unsteadiness on feet  Muscle weakness (generalized)  Apraxia     Problem List Patient Active Problem List   Diagnosis Date Noted  . CVA (cerebral vascular accident) ([place]) 05/02/2018  . Neurogenic bowel   . Neurogenic bladder   . Spastic hemiparesis ([place])   . 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  Cerebral infarction due to embolism of left middle cerebral artery (HCC) s/p thrombectomy 03/01/2018  . Carotid artery dissection ([place]) Left 03/01/2018  . Acute ischemic right MCA stroke ([place]) 03/01/2018  . Right hemiparesis ([place])   . Dysphagia, post-stroke   . Global aphasia   . Leukocytosis   . Folic acid deficiency [id number]  . B12 deficiency 02/23/2018  . Hyperhomocysteinemia ([place]) 02/23/2018  . Smoker    Phillips Grout PT, DPT, GCS  Lynee Rosenbach 05/17/2019, 9:46 AM  [place] MAIN [facility] SERVICES [address] [place], [place], [address] Phone: [phone]   Fax:  [phone]  Name: Kolson Sarafin MRN: [id number] Date of Birth: June 08, 1981

## 2019-05-22 ENCOUNTER — Ambulatory Visit: Payer: BC Managed Care – PPO

## 2019-05-24 ENCOUNTER — Ambulatory Visit: Payer: BC Managed Care – PPO

## 2019-05-29 ENCOUNTER — Ambulatory Visit: Payer: BC Managed Care – PPO

## 2019-05-31 ENCOUNTER — Other Ambulatory Visit: Payer: Self-pay

## 2019-05-31 ENCOUNTER — Encounter: Payer: Self-pay | Admitting: Physical Medicine & Rehabilitation

## 2019-05-31 ENCOUNTER — Encounter
Payer: BC Managed Care – PPO | Attending: Physical Medicine & Rehabilitation | Admitting: Physical Medicine & Rehabilitation

## 2019-05-31 ENCOUNTER — Ambulatory Visit: Payer: BC Managed Care – PPO

## 2019-05-31 VITALS — BP 143/83 | HR 76 | Temp 97.2°F | Ht 68.0 in | Wt 239.0 lb

## 2019-05-31 DIAGNOSIS — I69320 Aphasia following cerebral infarction: Secondary | ICD-10-CM | POA: Insufficient documentation

## 2019-05-31 DIAGNOSIS — Z87891 Personal history of nicotine dependence: Secondary | ICD-10-CM | POA: Diagnosis not present

## 2019-05-31 DIAGNOSIS — N319 Neuromuscular dysfunction of bladder, unspecified: Secondary | ICD-10-CM | POA: Insufficient documentation

## 2019-05-31 DIAGNOSIS — Z9889 Other specified postprocedural states: Secondary | ICD-10-CM | POA: Diagnosis not present

## 2019-05-31 DIAGNOSIS — K592 Neurogenic bowel, not elsewhere classified: Secondary | ICD-10-CM | POA: Insufficient documentation

## 2019-05-31 DIAGNOSIS — I69391 Dysphagia following cerebral infarction: Secondary | ICD-10-CM | POA: Diagnosis not present

## 2019-05-31 DIAGNOSIS — I69351 Hemiplegia and hemiparesis following cerebral infarction affecting right dominant side: Secondary | ICD-10-CM | POA: Diagnosis not present

## 2019-05-31 DIAGNOSIS — G8110 Spastic hemiplegia affecting unspecified side: Secondary | ICD-10-CM

## 2019-05-31 NOTE — Progress Notes (Signed)
Botox Injection for spasticity using needle EMG guidance Indication: Spastic hemiparesis (HCC)   Dilution: 100 Units/ml        Total Units Injected: 500 Indication: Severe spasticity which interferes with ADL,mobility and/or  hygiene and is unresponsive to medication management and other conservative care Informed consent was obtained after describing risks and benefits of the procedure with the patient. This includes bleeding, bruising, infection, excessive weakness, or medication side effects. A REMS form is on file and signed.  Needle: 69mm injectable monopolar needle electrode  Number of units per muscle Pectoralis Major 0 units Pectoralis Minor 0 units Biceps 0 units Brachioradialis 0 units FCR 25 units FCU 25 units FDS 150 units FDP 150 units FPL 50 units Pronator Teres 100 units Pronator Quadratus 0 units  All injections were done after obtaining appropriate EMG activity and after negative drawback for blood. The patient tolerated the procedure well. Post procedure instructions were given. Return in about 3 months (around 08/31/2019).

## 2019-05-31 NOTE — Patient Instructions (Signed)
PLEASE FEEL FREE TO CALL OUR OFFICE WITH ANY PROBLEMS OR QUESTIONS ([phone])      

## 2019-06-05 ENCOUNTER — Ambulatory Visit: Payer: BC Managed Care – PPO

## 2019-06-07 ENCOUNTER — Ambulatory Visit: Payer: BC Managed Care – PPO

## 2019-06-13 ENCOUNTER — Ambulatory Visit: Payer: BC Managed Care – PPO

## 2019-06-14 ENCOUNTER — Ambulatory Visit: Payer: BC Managed Care – PPO | Admitting: Physical Medicine & Rehabilitation

## 2019-06-27 DIAGNOSIS — K090 Developmental odontogenic cysts: Secondary | ICD-10-CM | POA: Diagnosis not present

## 2019-06-28 ENCOUNTER — Ambulatory Visit: Payer: BC Managed Care – PPO

## 2019-07-12 ENCOUNTER — Ambulatory Visit: Payer: BC Managed Care – PPO

## 2019-07-18 ENCOUNTER — Ambulatory Visit: Payer: BC Managed Care – PPO | Admitting: Family Medicine

## 2019-07-24 ENCOUNTER — Encounter: Payer: BC Managed Care – PPO | Admitting: Family

## 2019-08-12 IMAGING — DX DG CHEST 1V PORT
1 series · 1 of 1 positions shown · non-contrast
Comparison: 04/25/2018

CLINICAL DATA: Cough.  Seizure this morning.

EXAM:
PORTABLE CHEST 1 VIEW

[chest ap]
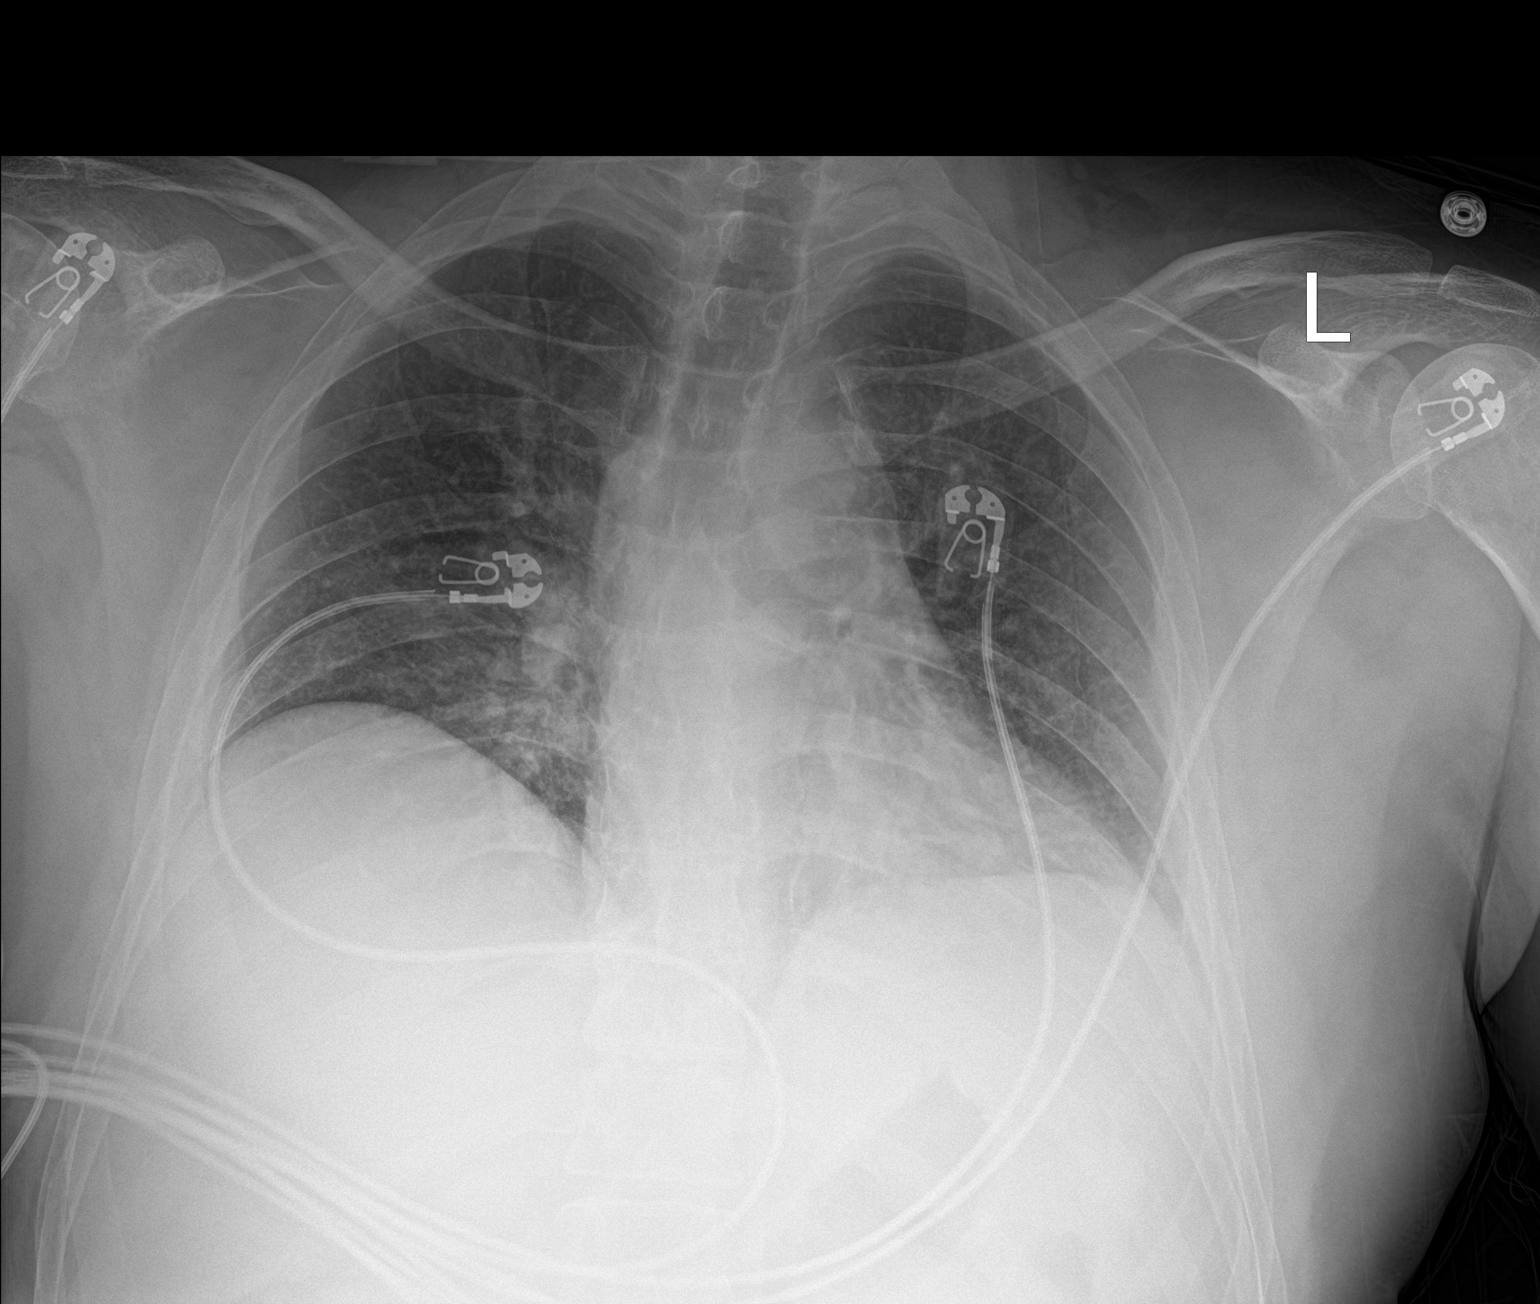

[1 of 1 positions shown; findings below may reference images not displayed]

FINDINGS: Low lung volumes with crowding of the central vasculature. No focal
airspace disease. Heart and mediastinum are within normal limits.
Trachea is midline. Negative for a pneumothorax. Bony thorax is
grossly intact.
IMPRESSION: Low lung volumes without focal chest disease.

## 2019-08-12 IMAGING — CT CT HEAD W/O CM
3 series · 14 of 47 positions shown, 16 images · non-contrast
Comparison: 05/25/2018 head CT.

CLINICAL DATA: Seizure this morning. No facial trauma reported.
History left MCA infarct.

EXAM:
CT HEAD WITHOUT CONTRAST
TECHNIQUE: Contiguous axial images were obtained from the base of the skull
through the vertex without intravenous contrast.

[Series 3: head 5.0 h30s · axial · 0.44mm/px · z∈[-160,-20]mm · 8 of 34 slices shown, 10 images]
[im 3/34  brain]
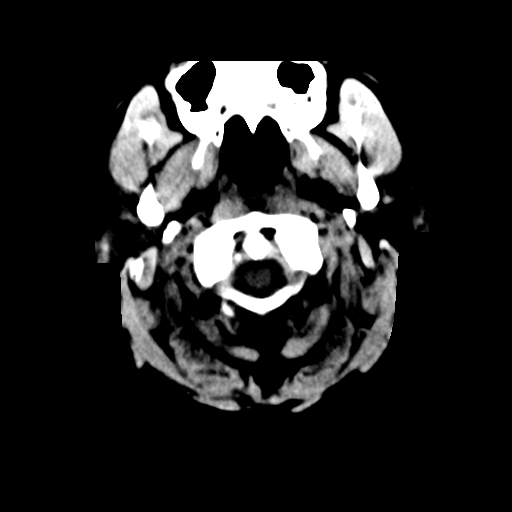
[im 3/34  bone]
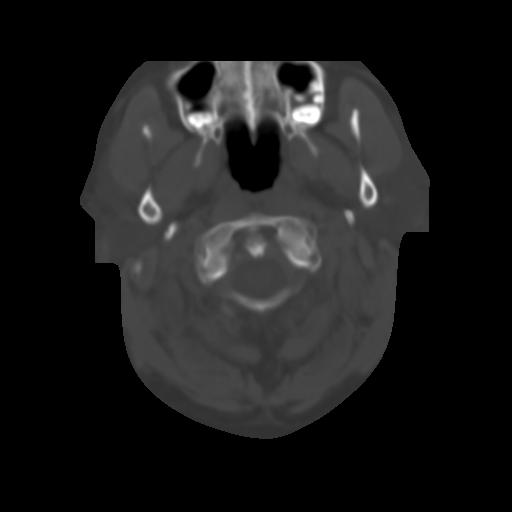
[im 7/34  brain]
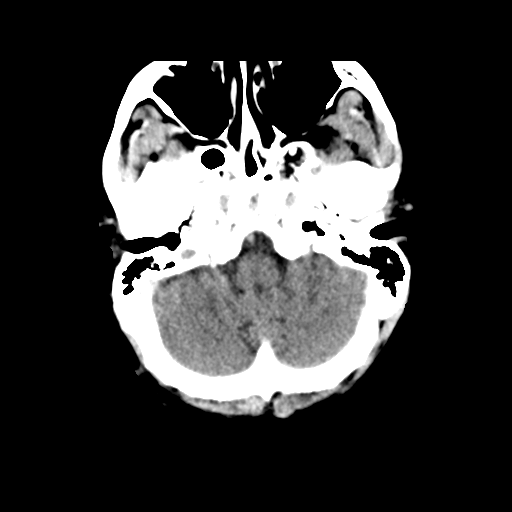
[im 11/34  brain]
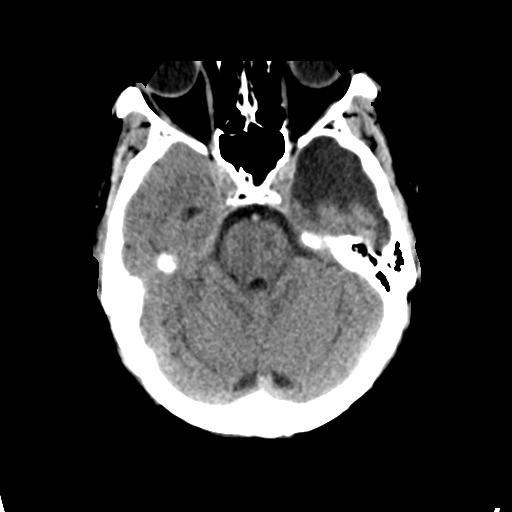
[im 15/34  brain]
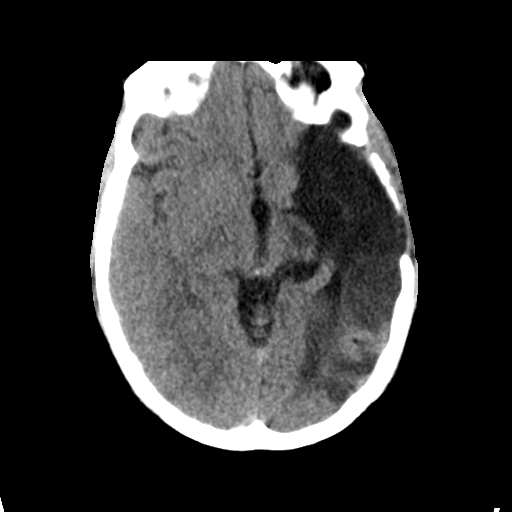
[im 19/34  brain]
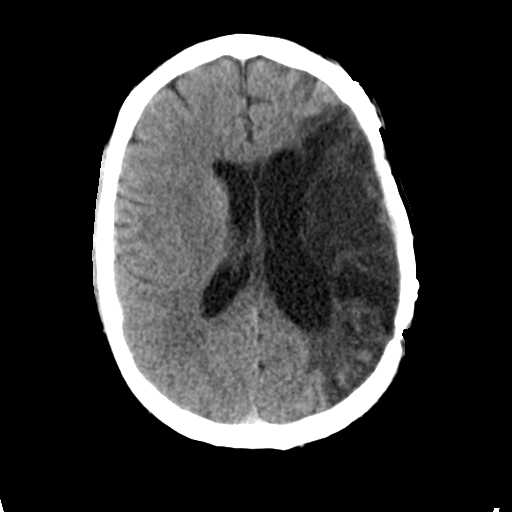
[im 19/34  bone]
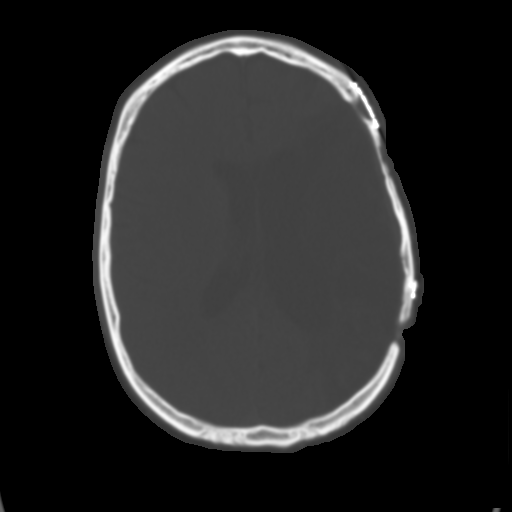
[im 23/34  brain]
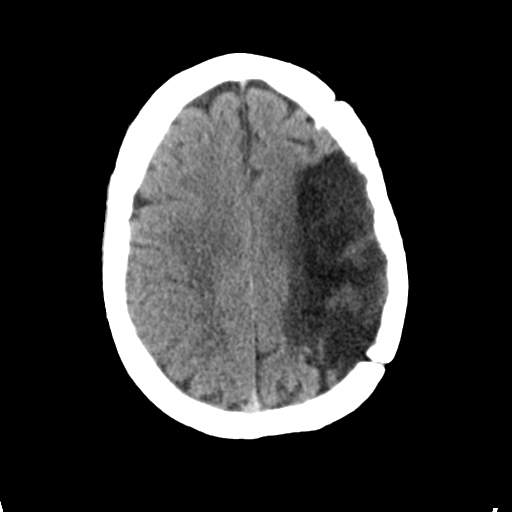
[im 27/34  brain]
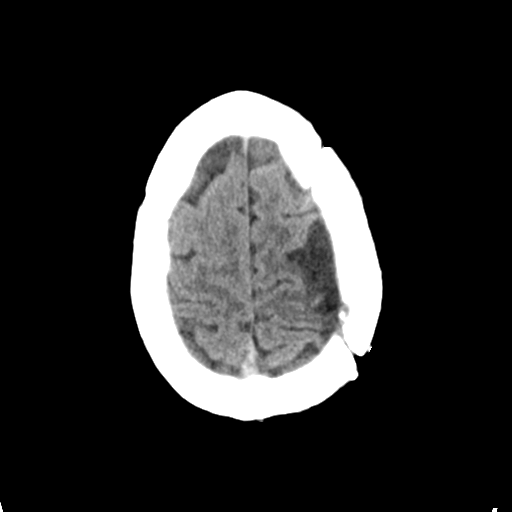
[im 31/34  brain]
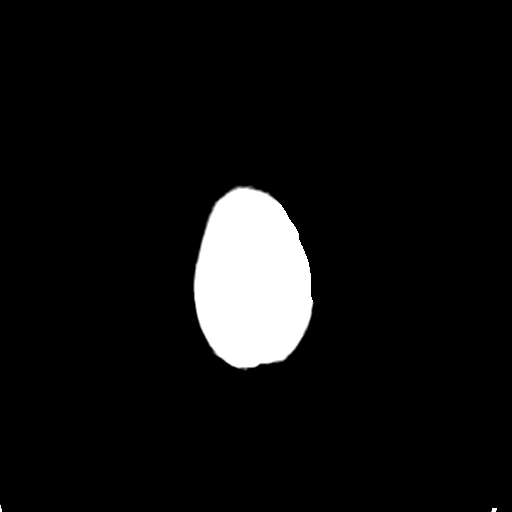

[Series 5: head 3.0 mpr cor · coronal · 0.34mm/px · 3 of 67 slices shown]
[im 23/67  brain]
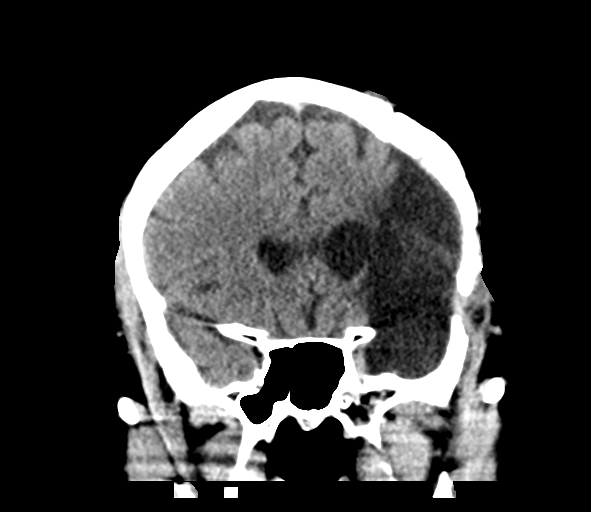
[im 30/67  brain]
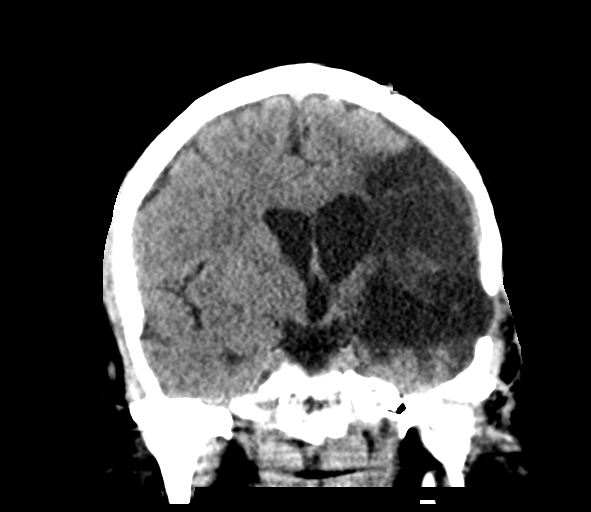
[im 37/67  brain]
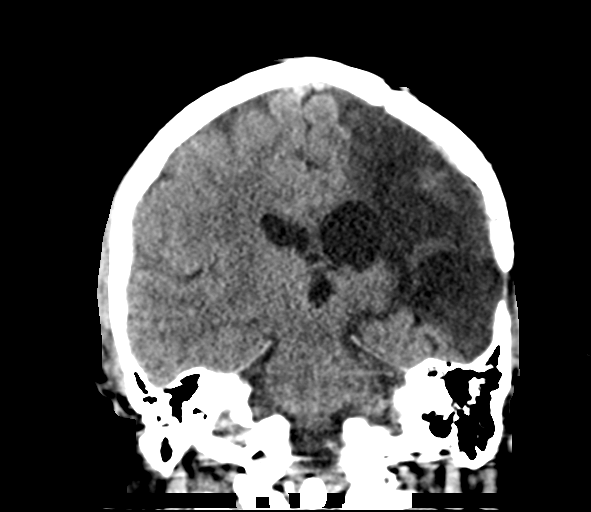

[Series 6: head 3.0 mpr sag · sagittal · 0.35mm/px · 3 of 67 slices shown]
[im 23/67  brain]
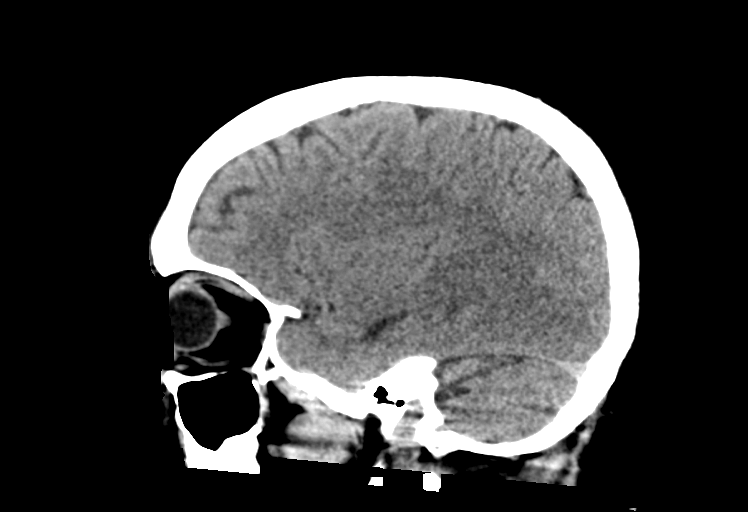
[im 34/67  brain]
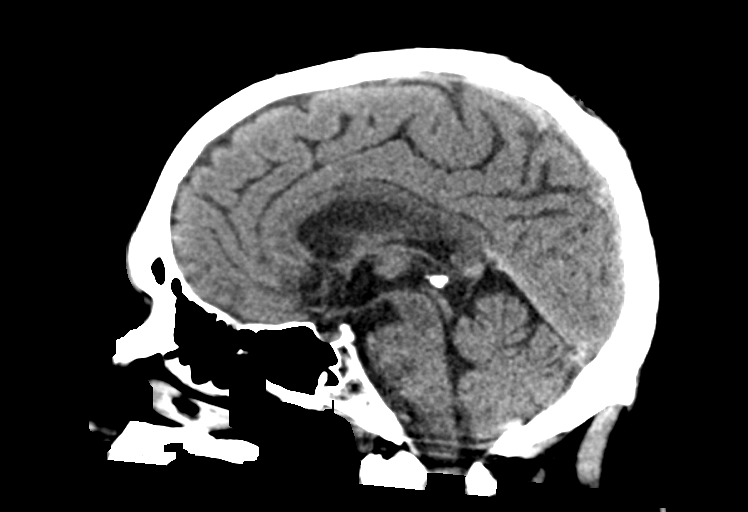
[im 45/67  brain]
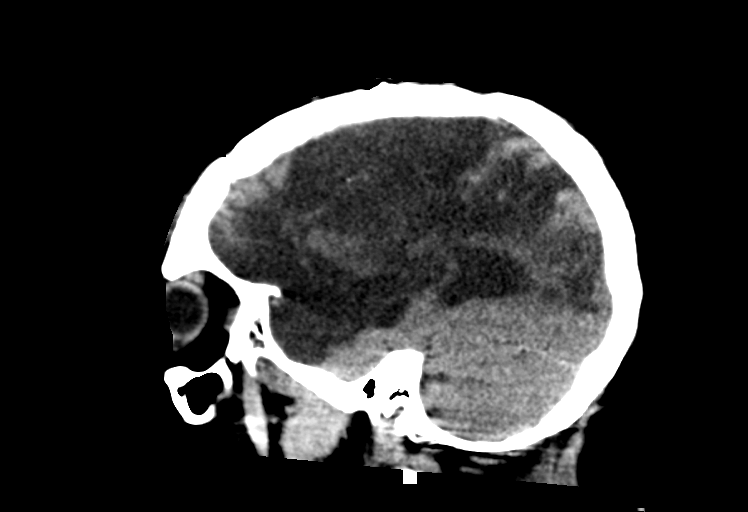

[14 of 47 positions shown; findings below may reference images not displayed]

FINDINGS: Brain: Stable chronic large region of encephalomalacia in the left
MCA territory with ex vacuo dilatation of the left lateral
ventricle. Chronic small left frontal extra-axial collection deep to
left craniotomy measuring up to 3 mm thickness, decreased from 5 mm
on 05/25/2018 CT. No evidence of parenchymal hemorrhage or new
extra-axial fluid collection. No mass lesion, mass effect, or
midline shift. No CT evidence of acute infarction. No acute
ventriculomegaly.

Vascular: No acute abnormality.

Skull: No evidence of calvarial fracture. Stable postsurgical
changes from left frontoparietal craniotomy.

Sinuses/Orbits: The visualized paranasal sinuses are essentially
clear.

Other:  The mastoid air cells are unopacified.
IMPRESSION: 1. No evidence of acute intracranial abnormality. No evidence of
calvarial fracture.
2. Stable chronic large region of encephalomalacia in the left MCA
territory. Small chronic left frontal extra-axial collection deep to
the left craniotomy is decreased in size in the interval.

## 2019-08-14 ENCOUNTER — Telehealth: Payer: Self-pay

## 2019-08-14 NOTE — Telephone Encounter (Signed)
Patient called requesting refill of medications.  Did not say exactly which ones.  Called him back for clarity, no ansswer, left voicemail to return call

## 2019-08-15 ENCOUNTER — Other Ambulatory Visit: Payer: Self-pay

## 2019-08-15 ENCOUNTER — Other Ambulatory Visit: Payer: Self-pay | Admitting: Physical Medicine & Rehabilitation

## 2019-08-15 DIAGNOSIS — E539 Vitamin B deficiency, unspecified: Secondary | ICD-10-CM

## 2019-08-15 MED ORDER — PYRIDOXINE HCL 50 MG PO TABS: 50.0000 mg | ORAL_TABLET | Freq: Every day | ORAL | 2 refills | Status: AC

## 2019-08-15 MED ORDER — CYANOCOBALAMIN 1000 MCG PO TABS: 1000.0000 ug | ORAL_TABLET | Freq: Every day | ORAL | 2 refills | Status: AC

## 2019-08-15 MED ORDER — FOLIC ACID 1 MG PO TABS: ORAL_TABLET | ORAL | 2 refills | Status: DC

## 2019-08-15 MED ORDER — BACLOFEN 10 MG PO TABS: 10.0000 mg | ORAL_TABLET | Freq: Four times a day (QID) | ORAL | 2 refills | Status: DC

## 2019-08-15 NOTE — Telephone Encounter (Signed)
Patient called back and stated he needed all previously prescribed meds from Dr. Naaman Plummer to be refilled.  Refilled accordant to clinical protocol

## 2019-08-28 ENCOUNTER — Telehealth: Payer: Self-pay

## 2019-08-28 NOTE — Telephone Encounter (Signed)
Dr  Naaman Plummer ptn called and left voicemail needs letter for long term disability wants to pick up at this appt 01.20 if possible

## 2019-08-28 NOTE — Telephone Encounter (Signed)
What is this letter supposed to be proclaiming?

## 2019-08-29 ENCOUNTER — Encounter: Payer: Self-pay | Admitting: Adult Health

## 2019-08-29 ENCOUNTER — Other Ambulatory Visit: Payer: Self-pay

## 2019-08-29 ENCOUNTER — Ambulatory Visit: Payer: BC Managed Care – PPO | Admitting: Adult Health

## 2019-08-29 VITALS — BP 130/88 | HR 80 | Temp 97.2°F | Ht 68.0 in | Wt 238.2 lb

## 2019-08-29 DIAGNOSIS — R4701 Aphasia: Secondary | ICD-10-CM

## 2019-08-29 DIAGNOSIS — R569 Unspecified convulsions: Secondary | ICD-10-CM

## 2019-08-29 DIAGNOSIS — I63512 Cerebral infarction due to unspecified occlusion or stenosis of left middle cerebral artery: Secondary | ICD-10-CM

## 2019-08-29 DIAGNOSIS — H53461 Homonymous bilateral field defects, right side: Secondary | ICD-10-CM

## 2019-08-29 DIAGNOSIS — R7989 Other specified abnormal findings of blood chemistry: Secondary | ICD-10-CM

## 2019-08-29 DIAGNOSIS — I69398 Other sequelae of cerebral infarction: Secondary | ICD-10-CM

## 2019-08-29 DIAGNOSIS — E538 Deficiency of other specified B group vitamins: Secondary | ICD-10-CM

## 2019-08-29 DIAGNOSIS — I10 Essential (primary) hypertension: Secondary | ICD-10-CM

## 2019-08-29 DIAGNOSIS — G8111 Spastic hemiplegia affecting right dominant side: Secondary | ICD-10-CM

## 2019-08-29 MED ORDER — LEVETIRACETAM 750 MG PO TABS: 750.0000 mg | ORAL_TABLET | Freq: Two times a day (BID) | ORAL | 3 refills | Status: DC

## 2019-08-29 NOTE — Patient Instructions (Signed)
Continue aspirin 325 mg daily for secondary stroke prevention  Continue Keppra 750 mg twice daily for seizure prophylaxis  Continue to follow up with PCP regarding blood pressure management   Restart outpatient therapies once able -please let me know if you need assistance with new order or referral in order to restart  Continue to monitor blood pressure at home  Maintain strict control of hypertension with blood pressure goal below 130/90, diabetes with hemoglobin A1c goal below 6.5% and cholesterol with LDL cholesterol (bad cholesterol) goal below 70 mg/dL. I also advised the patient to eat a healthy diet with plenty of whole grains, cereals, fruits and vegetables, exercise regularly and maintain ideal body weight.  Followup in the future with me in 6 months or call earlier if needed       Thank you for coming to see [place] at [facility]. I hope we have been able to provide you high quality care today.  You may receive a patient satisfaction survey over the next few weeks. We would appreciate your feedback and comments so that we may continue to improve ourselves and the health of our patients.

## 2019-08-29 NOTE — Progress Notes (Signed)
[facility] [address] [place]. [place] [address] [phone]       OFFICE FOLLOW UP NOTE  Xavier. Xavier Villegas Date of Birth:  [birth year] Medical Record Number:  [id number]   Reason for Referral: stroke follow up  CHIEF COMPLAINT:  Chief Complaint  Patient presents with  . Follow-up    Rm 9, Father  . Cerebrovascular Accident    Home exercises.     HPI:  Update 08/29/2019: Xavier Villegas is a 39 year old male who is being seen today for stroke and seizure follow-up.  He has been stable from a neurological standpoint with residual stroke deficits of right spastic hemiparesis, severe expressive aphasia and right homonymous hemianopia.  He was previously working with PT but this has been placed on hold back in 05/2019 due to extensive dental procedure.  He continues to do exercises at home which were recommended by therapy as well as father helping with range of motion to help with spasticity.  He recently received Botox injections by Dr. Naaman Plummer at [facility] but this also has been placed on hold due to financial and insurance reasons.  His mother does endorse ongoing improvement of right hemiparesis with improvement of gait and at times is able to ambulate without AFO brace as well as increased strength of RUE.  He has not had any reoccurring seizure activity and continues on Keppra 750 mg twice daily tolerating well without side effects.  Continues on aspirin 325 mg daily without bleeding or bruising for secondary stroke prevention.  Blood pressure today 130/88.  He was also continued on B12, B6 and folate which was initiated during hospitalization due to evidence of hyperhomocystinemia, [id number] deficiency folic acid deficiency.  Repeat labs have not been performed since hospitalization.  No further concerns at this time.      Update 02/23/2019: Xavier Villegas is a 39 year old male who is being seen today for follow-up regarding seizure activity on 10/15/2018 likely secondary  to prior left MCA infarct.  He did undergo EEG on 10/31/2018 which did not show evidence of seizure activity.  He denies any recurrent seizure activity and continues on Keppra 750 mg twice daily without reported side effects. Underwent repeat carotid ultrasound on 10/27/2018 which showed bilateral ICA stenosis of 1 to 39% and VAs antegrade flow.  Residual stroke deficits right spastic hemiparesis, severe expressive aphasia and right homonymous hemianopsia stable. Continues to work with therapy thru [employer].  He was previously working with speech therapy at [facility] but has since been completed and is currently attempting to obtain speech machine.  Insurance will start paying for outpatient therapy again after 03/11/19. Blood pressure 132/91 which is normal for patient. Continues on aspirin 325mg  daily without bleeding or bruising. No further concerns at this time.     10/25/2018 visit: Xavier Villegas is a 39 year old male who is being seen today for follow-up visit after admission to ED on 10/15/2018 after witnessed seizure activity with history of left MCA infarct with small ICH/SDH hemorrhagic conversion s/p craniotomy.  Per ED notes, consisted of "moving his right arm, sticking his tongue out and convulsing for approximately 2 to 3 minutes along with incontinence episode per mother".  Reported postictal state per mother.  He ultimately had recurrent seizure during admission lasting for less than 2 minutes.  CT head reviewed and was negative for acute intracranial abnormality and showed stable chronic large region of encephalomalacia in the left MCA territory with small chronic left frontal extra-axial collection deep to the left  craniotomy. initiated Keppra 750 mg twice daily at discharge for seizure prophylaxis.  He denies any recurrent seizure activity since this time.  He has tolerated Keppra without reported side effects.  He continues to have residual stroke deficits of right spastic hemiparesis,  severe expressive aphasia and right homonymous hemianopia.  No further concerns at this time.  He does continue to take aspirin 325 mg without reported side effects of bleeding or bruising.  Blood pressure today satisfactory at 128/81.  He continues to live at home but is able to maintain ADLs independently.  He was previously participating at [employer] for physical and speech therapy as his insurance will not approve additional therapy sessions at this time.  Unfortunately, due to recent pandemic, these colleges have close at this time but he plans on restarting once able.  He returns today for reevaluation and follow-up regarding recent seizure activity.      History 08/24/2018: Patient is being seen today for 9-month follow-up visit and is accompanied by his mother.  He has been doing well since prior visit with continued post stroke deficits of right spastic hemiparesis, severe expressive aphasia and right homonymous hemianopia. He continues to work with PT/OT at our neuro rehab clinic and does endorse some improvement of his hemiparesis.  He currently is ambulating with a cane but typically will not use the cane in his home unless he is going up steps.  He is currently on baclofen to assist with spasticity and he does endorse benefit from this.  He is unable to participate in further ST sessions due to insurance reasons and plans on looking into volunteering at a speech therapy school in hopes of obtaining additional ST.  He does follow with ophthalmology for peripheral vision exams and per mother, these have been stable.  He continues on aspirin 325 mg without side effects of bleeding or bruising.  Blood pressure today satisfactory 130/85.  No further concerns at this time.  Denies new or worsening stroke/TIA symptoms.  05/17/2018 visit: Patient is being seen today for hospital follow up and is accompanied by his aunt. Underwent re-implantation of cranial flap on 05/02/18 which was tolerated well  without complication.  He continues to have right hemiparesis with spasticity and expressive aphasia but does feel as though all have been improving.  He participates at our neuro rehab clinic where he receives PT/OT/ST.  He is able to ambulate with quad cane and use of foot drop brace but denies any recent falls.  He is able to say a few words but for the most part nods head appropriately.  He also continues to have right homonymous hemianopia and is unsure if there has been any improvement.  He was discharged from hospital stay on aspirin 325 and has continued to take this without side effects of bleeding or bruising.  Blood pressure today satisfactory at 125/80.  He currently is living with his father for continued assistance but is able to bathe and dress himself independently.  Patient appears to be in good spirits regarding recovery progress and the future.  Denies any depression-like symptoms or anxiety.  No further concerns at this time.  Denies new or worsening stroke/TIA symptoms.   03/01/2018 hospital admission: Xavier Villegas a 39 y.o.malewith no significant past medical history other than tobacco usewho presented withright-sided weakness and aphasia.Hedid not receive IV t-PA due to late presentation.  CT head reviewed and showed large territory acute infarct left MCA territory.  Due to these findings, he was  taken to MRI for consideration of IR if his infarct burden appears less than estimated by CTP but unfortunately due to other patient care issues there would have been a significant delay therefore patient was taken to IR for intervention. Patient underwent attempted thrombectomy for occlusive left ICA which resulted in left ICA dissection and possible ICH.  Postprocedure CT showed subarachnoid hemorrhage with hemorrhagic transformation.  MRI head reviewed and showed large left MCA infarct with small ICH/SDH hemorrhagic conversion.  MRA head showed left ICA and MCA  occlusion.  CTA head and neck showed acute dissection left internal carotid artery with intraluminal thrombus along with 2 mm midline shift.  Repeat CT showed progressive cytotoxic edema with slight mass-effect and midline shift where he required left decompressive hemicraniotomy as well as hypertonic saline for management of edema. Repeat CT scan showed improvement after treatment. Lower extremity venous Dopplers were negative for DVT.  Carotid Doppler showed left ICA occlusion.  2D echo showed an EF of 55 to 60% with aortic valve mass versus vegetation.  TEE was normal without evidence of vegetation.  Hypercoagulable work-up did show high homocystine at 181.7 and he was started on high-dose folate and B6 as well as B12 injection.  LDL 46 and A1c 4.9.  Patient was discharged to [facility] for continuation of therapy in stable condition. Patient was discharged home on 04/06/18 with 24 hr supervision.       ROS:   14 system review of systems performed and negative with exception of seizures, weakness, visual loss difficulty    PMH:  Past Medical History:  Diagnosis Date  . Aphasia   . GERD (gastroesophageal reflux disease)   . Hemiparesis ([place]) 02/2018   right side  . Hyperhomocysteinemia ([place])   . Neurogenic bladder 02/2018   04/26/18 inporving  . Spastic hemiparesis ([facility])    right side  . Stroke ([place])    Left MCA infart  . Tobacco abuse     PSH:  Past Surgical History:  Procedure Laterality Date  . CRANIOTOMY Left 02/21/2018   Procedure: DECOMPRESSIVE CRANIECTOMY with bone flap to abdomen;  Surgeon: [name], MD;  Location: [place];  Service: Neurosurgery;  Laterality: Left;  DECOMPRESSIVE CRANIECTOMY with bone flap to abdomen  . CRANIOTOMY N/A 05/02/2018   Procedure: RE-IMPLANTATION OF CRANIAL FLAP;  Surgeon: [name], MD;  Location: [place];  Service: Neurosurgery;  Laterality: N/A;  RE-IMPLANTATION OF CRANIAL FLAP  . IR ANGIO INTRA EXTRACRAN SEL COM CAROTID INNOMINATE UNI R MOD SED   02/19/2018  . IR ANGIO VERTEBRAL SEL VERTEBRAL BILAT MOD SED  02/19/2018  . IR CT HEAD LTD  02/19/2018  . IR PERCUTANEOUS ART THROMBECTOMY/INFUSION INTRACRANIAL INC DIAG ANGIO  02/19/2018  . IR US GUIDE VASC ACCESS RIGHT  02/19/2018  . RADIOLOGY WITH ANESTHESIA N/A 02/19/2018   Procedure: IR WITH ANESTHESIA CODE STROKE;  Surgeon: Corrie Mckusick, DO;  Location: [place];  Service: Anesthesiology;  Laterality: N/A;  . TEE WITHOUT CARDIOVERSION N/A 03/01/2018   Procedure: TRANSESOPHAGEAL ECHOCARDIOGRAM (TEE);  Surgeon: Sanda Klein, MD;  Location: [facility] ENDOSCOPY;  Service: Cardiovascular;  Laterality: N/A;    Social History:  Social History   Socioeconomic History  . Marital status: Divorced    Spouse name: Not on file  . Number of children: Not on file  . Years of education: Not on file  . Highest education level: Not on file  Occupational History  . Not on file  Tobacco Use  . Smoking status: Former Smoker  Packs/day: 0.50    Years: 8.00    Pack years: 4.00    Types: Cigarettes    Quit date: 02/19/2018    Years since quitting: 1.5  . Smokeless tobacco: Never Used  Substance and Sexual Activity  . Alcohol use: Not Currently    Alcohol/week: 14.0 standard drinks    Types: 14 Cans of beer per week  . Drug use: Never  . Sexual activity: Yes  Other Topics Concern  . Not on file  Social History Narrative  . Not on file   Social Determinants of Health   Financial Resource Strain:   . Difficulty of Paying Living Expenses: Not on file  Food Insecurity:   . Worried About Charity fundraiser in the Last Year: Not on file  . Ran Out of Food in the Last Year: Not on file  Transportation Needs:   . Lack of Transportation (Medical): Not on file  . Lack of Transportation (Non-Medical): Not on file  Physical Activity:   . Days of Exercise per Week: Not on file  . Minutes of Exercise per Session: Not on file  Stress:   . Feeling of Stress : Not on file  Social Connections:   . Frequency  of Communication with Friends and Family: Not on file  . Frequency of Social Gatherings with Friends and Family: Not on file  . Attends Religious Services: Not on file  . Active Member of Clubs or Organizations: Not on file  . Attends Archivist Meetings: Not on file  . Marital Status: Not on file  Intimate Partner Violence:   . Fear of Current or Ex-Partner: Not on file  . Emotionally Abused: Not on file  . Physically Abused: Not on file  . Sexually Abused: Not on file    Family History:  Family History  Problem Relation Age of Onset  . Stroke Maternal Grandmother   . Liver cancer Maternal Grandmother   . Stroke Maternal Grandfather   . Leukemia Mother        CLL/[facility]. Leretha [facility]  . Diabetes Father   . High blood pressure Father   . Liver cancer Paternal Grandmother   . Cerebral aneurysm Paternal Grandfather   . Stroke Paternal Grandfather     Medications:   Current Outpatient Medications on File Prior to Visit  Medication Sig Dispense Refill  . aspirin 325 MG tablet Take 1 tablet (325 mg total) by mouth daily.    . baclofen (LIORESAL) 10 MG tablet TAKE 1 TABLET(10 MG) BY MOUTH FOUR TIMES DAILY 120 tablet 2  . cholecalciferol (VITAMIN D3) 25 MCG (1000 UT) tablet Take 1,000 Units by mouth daily.    . cyanocobalamin 1000 MCG tablet Take 1 tablet (1,000 mcg total) by mouth daily. 30 tablet 2  . folic acid (FOLVITE) 1 MG tablet TAKE 1 TABLET(1 MG) BY MOUTH FOUR TIMES DAILY 120 tablet 2  . levETIRAcetam (KEPPRA) 750 MG tablet Take 1 tablet (750 mg total) by mouth 2 (two) times daily. 180 tablet 3  . OVER THE COUNTER MEDICATION Take 2 capsules by mouth See admin instructions. Psyllium fiber 2 capsules by mouth everyday    . pantoprazole (PROTONIX) 40 MG tablet Take 40 mg by mouth daily as needed (for GERD).     . polyethylene glycol (MIRALAX / GLYCOLAX) packet Take 17 g by mouth daily as needed for mild constipation.     Marland Kitchen pyridOXINE (B-6) 50 MG tablet Take 1 tablet  (50 mg total) by  mouth daily. 30 tablet 2   No current facility-administered medications on file prior to visit.    Allergies:  No Known Allergies   Physical Exam  Vitals:   08/29/19 1330  BP: 130/88  Pulse: 80  Temp: (!) 97.2 F (36.2 C)  SpO2: 97%  Weight: 238 lb 3.2 oz (108 kg)  Height: 5\' 8"  (1.727 m)   Body mass index is 36.22 kg/m. No exam data present  General: well developed, well nourished, pleasant young Caucasian male, seated, in no evident distress Head: head normocephalic and atraumatic.   Neck: supple with no carotid or supraclavicular bruits Cardiovascular: regular rate and rhythm, no murmurs Musculoskeletal: no deformity Skin:  no rash/petichiae Vascular:  Normal pulses all extremities  Neurologic Exam Mental Status: Awake and fully alert.  Severe expressive aphasia.  Nods appropriately to yes/no questions.  Oriented to place and time. Recent and remote memory intact. Attention span, concentration and fund of knowledge appropriate. Mood and affect appropriate.  Cranial Nerves: Pupils equal, briskly reactive to light. Extraocular movements full without nystagmus. Visual fields mild right homonymous hemianopia.Marland Kitchen Hearing intact.  Left-sided facial sensation decreased compared to left side.  Mild facial paralysis right side.  Motor: RUE: 2/5 with spasticity greatest in hand and unable to extend fingers; RLE: 4+/5 hip flexor, 4/5 quad, weak ankle dorsiflexon with use of AFO; both with spasticity.  Full strength left upper and lower extremity Sensory.:  Decreased sensation right upper and lower extremity compared to left upper and lower extremity Coordination: Rapid alternating movements normal on left side. Finger-to-nose and heel-to-shin performed accurately on left side. Gait and Station: Arises from chair with mild difficulty. Stance is normal. Gait demonstrates hemiplegic gait with assistance of cane Reflexes: 1+ left upper and lower extremity.  2+ right upper  and lower extremity.  Toes downgoing.       Diagnostic Data (Labs, Imaging, Testing)  CT HEAD WO CONTRAST 10/15/2018 IMPRESSION: 1. No evidence of acute intracranial abnormality. No evidence of calvarial fracture. 2. Stable chronic large region of encephalomalacia in the left MCA territory. Small chronic left frontal extra-axial collection deep to the left craniotomy is decreased in size in the interval.   ASSESSMENT: Xavier Villegas is a 39 y.o. year old male here with large left MCA infarct on 02/19/18 secondary to left ICA dissection s/p [id number] revascularization of L MCA M1/2 followed by decompressive craniectomy. Vascular risk factors include tobacco use, EtOH use, HTN and severe hyperhomocysteinemia.  He unfortunately has sustained recent seizure activity on 10/15/2018 likely related to prior large left MCA infarct and was initiated on Keppra for seizure prophylaxis.  He has not had any additional seizure events with continuation of Keppra.  Residual stroke deficits stable which include right hemiparesis, right homonymous hemianopia and severe expressive aphasia.    PLAN: -Continue aspirin 325 mg daily for secondary stroke prevention -Advised to continue Keppra 750 mg twice daily for seizure prophylaxis -F/u with PCP regarding annual physicals and stroke risk monitoring  -Obtain folate level, homocysteine, B12 and B6 levels to ensure ongoing management -Recommend continued exercises at home and restart therapy once able -continue to monitor BP at home -advised to continue to stay active at home along with home therapy exercises and maintain a healthy diet -Maintain strict control of hypertension with blood pressure goal below 130/90, diabetes with hemoglobin A1c goal below 6.5% and cholesterol with LDL cholesterol (bad cholesterol) goal below 70 mg/dL. I also advised the patient to eat a healthy diet with plenty of  whole grains, cereals, fruits and vegetables, exercise regularly  and maintain ideal body weight.  Follow up in 6 months or call earlier if needed   Greater than 50% of time during this 25 minute visit was spent on counseling,explanation of diagnosis of left MCA infarct, reviewing risk factor management of left ICA dissection, seizure activity likely related to left MCA infarct and potential of recurrence in the future, planning of further management, discussion with patient and family and coordination of care    Frann Rider, [facility]  [facility] [address] [place], [place] [address]  Phone [phone] Fax [phone] Note: This document was prepared with digital dictation and possible smart phrase technology. Any transcriptional errors that result from this process are unintentional.

## 2019-08-30 ENCOUNTER — Encounter
Payer: BC Managed Care – PPO | Attending: Physical Medicine & Rehabilitation | Admitting: Physical Medicine & Rehabilitation

## 2019-08-30 ENCOUNTER — Other Ambulatory Visit: Payer: Self-pay

## 2019-08-30 ENCOUNTER — Encounter: Payer: Self-pay | Admitting: Physical Medicine & Rehabilitation

## 2019-08-30 VITALS — BP 133/85 | HR 81 | Temp 97.5°F | Ht 68.0 in | Wt 237.0 lb

## 2019-08-30 DIAGNOSIS — G8110 Spastic hemiplegia affecting unspecified side: Secondary | ICD-10-CM | POA: Insufficient documentation

## 2019-08-30 MED ORDER — BACLOFEN 20 MG PO TABS: 20.0000 mg | ORAL_TABLET | Freq: Two times a day (BID) | ORAL | 3 refills | Status: DC

## 2019-08-30 NOTE — Patient Instructions (Addendum)
COVID-19 Vaccine Information can be found at: [email or web address] For questions related to vaccine distribution or appointments, please email [email or web address] or call [phone].     BACLOFEN: 05-19-09-20MG  FOR 4 DAYS, THEN 20-10-10-20MG  FOR 4 DAYS, THEN 20-20-10-20MG  FOR 4 DAYS, THEN 20MG  4X DAILY

## 2019-08-30 NOTE — Progress Notes (Signed)
Subjective:    Patient ID: Xavier Villegas, male    DOB: 1980-08-30, 39 y.o.   MRN: [id number]  HPI   Xavier Villegas is here in follow-up of his left MCA infarct with spastic right hemiparesis.  I last saw him in October.  We performed Botox 500 units to his right upper extremity.  Patient did have some results with this but tone has increased over the last few weeks in particular.  He is unable to use the arm for functional activities due to the tone and ongoing weakness.  He denies frank pain in the arm.  He has been walking at home as well as doing stretching exercises.  Dad reports that he is walking more without his brace currently.  He continues on baclofen 10 mg 4 times daily for spasticity.  We have not titrated further with this yet.   Pain Inventory Average Pain 3 Pain Right Now 4 My pain is dull  In the last 24 hours, has pain interfered with the following? General activity 4 Relation with others 3 Enjoyment of life 4 What TIME of day is your pain at its worst? daytime Sleep (in general) Fair  Pain is worse with: walking Pain improves with: medication Relief from Meds: 1  Mobility use a cane how many minutes can you walk? 10-15 ability to climb steps?  no do you drive?  no  Function not employed: date last employed 02/19/18 disabled: date disabled 02/2018 I need assistance with the following:  feeding, dressing, bathing, meal prep, household duties and shopping  Neuro/Psych weakness numbness trouble walking  Prior Studies Any changes since last visit?  no  Physicians involved in your care Primary care . Neurologist .   Family History  Problem Relation Age of Onset  . Stroke Maternal Grandmother   . Liver cancer Maternal Grandmother   . Stroke Maternal Grandfather   . Leukemia Mother        CLL/Dr. Leretha Pol at [facility]  . Diabetes Father   . High blood pressure Father   . Liver cancer Paternal Grandmother   . Cerebral aneurysm Paternal Grandfather     . Stroke Paternal Grandfather    Social History   Socioeconomic History  . Marital status: Divorced    Spouse name: Not on file  . Number of children: Not on file  . Years of education: Not on file  . Highest education level: Not on file  Occupational History  . Not on file  Tobacco Use  . Smoking status: Former Smoker    Packs/day: 0.50    Years: 8.00    Pack years: 4.00    Types: Cigarettes    Quit date: 02/19/2018    Years since quitting: 1.5  . Smokeless tobacco: Never Used  Substance and Sexual Activity  . Alcohol use: Not Currently    Alcohol/week: 14.0 standard drinks    Types: 14 Cans of beer per week  . Drug use: Never  . Sexual activity: Yes  Other Topics Concern  . Not on file  Social History Narrative  . Not on file   Social Determinants of Health   Financial Resource Strain:   . Difficulty of Paying Living Expenses: Not on file  Food Insecurity:   . Worried About Charity fundraiser in the Last Year: Not on file  . Ran Out of Food in the Last Year: Not on file  Transportation Needs:   . Lack of Transportation (Medical): Not on file  . Lack  of Transportation (Non-Medical): Not on file  Physical Activity:   . Days of Exercise per Week: Not on file  . Minutes of Exercise per Session: Not on file  Stress:   . Feeling of Stress : Not on file  Social Connections:   . Frequency of Communication with Friends and Family: Not on file  . Frequency of Social Gatherings with Friends and Family: Not on file  . Attends Religious Services: Not on file  . Active Member of Clubs or Organizations: Not on file  . Attends Archivist Meetings: Not on file  . Marital Status: Not on file   Past Surgical History:  Procedure Laterality Date  . CRANIOTOMY Left 02/21/2018   Procedure: DECOMPRESSIVE CRANIECTOMY with bone flap to abdomen;  Surgeon: [name], MD;  Location: [place];  Service: Neurosurgery;  Laterality: Left;  DECOMPRESSIVE CRANIECTOMY with bone flap  to abdomen  . CRANIOTOMY N/A 05/02/2018   Procedure: RE-IMPLANTATION OF CRANIAL FLAP;  Surgeon: [name], MD;  Location: [place];  Service: Neurosurgery;  Laterality: N/A;  RE-IMPLANTATION OF CRANIAL FLAP  . IR ANGIO INTRA EXTRACRAN SEL COM CAROTID INNOMINATE UNI R MOD SED  02/19/2018  . IR ANGIO VERTEBRAL SEL VERTEBRAL BILAT MOD SED  02/19/2018  . IR CT HEAD LTD  02/19/2018  . IR PERCUTANEOUS ART THROMBECTOMY/INFUSION INTRACRANIAL INC DIAG ANGIO  02/19/2018  . IR US GUIDE VASC ACCESS RIGHT  02/19/2018  . RADIOLOGY WITH ANESTHESIA N/A 02/19/2018   Procedure: IR WITH ANESTHESIA CODE STROKE;  Surgeon: Corrie Mckusick, DO;  Location: [place];  Service: Anesthesiology;  Laterality: N/A;  . TEE WITHOUT CARDIOVERSION N/A 03/01/2018   Procedure: TRANSESOPHAGEAL ECHOCARDIOGRAM (TEE);  Surgeon: Sanda Klein, MD;  Location: [facility];  Service: Cardiovascular;  Laterality: N/A;   Past Medical History:  Diagnosis Date  . Aphasia   . GERD (gastroesophageal reflux disease)   . Hemiparesis ([place]) 02/2018   right side  . Hyperhomocysteinemia ([place])   . Neurogenic bladder 02/2018   04/26/18 inporving  . Spastic hemiparesis ([facility])    right side  . Stroke ([place])    Left MCA infart  . Tobacco abuse    BP 133/85   Pulse 81   Temp (!) 97.5 F (36.4 C)   Ht 5\' 8"  (1.727 m)   Wt 237 lb (107.5 kg)   SpO2 96%   BMI 36.04 kg/m   Opioid Risk Score:   Fall Risk Score:  `1  Depression screen PHQ 2/9  Depression screen [facility] 2/9 03/07/2019 01/18/2019 07/11/2018 04/18/2018  Decreased Interest 0 0 0 0  Down, Depressed, Hopeless 0 0 0 0  PHQ - 2 Score 0 0 0 0  Altered sleeping - 0 - -  Tired, decreased energy - 0 - -  Change in appetite - 0 - -  Feeling bad or failure about yourself  - 0 - -  Trouble concentrating - 0 - -  Moving slowly or fidgety/restless - 0 - -  Suicidal thoughts - 0 - -  PHQ-9 Score - 0 - -  Difficult doing work/chores - Not difficult at all - -  Some recent data might be hidden     Review of Systems  Constitutional: Positive for unexpected weight change.  Gastrointestinal: Positive for constipation.  Musculoskeletal: Positive for gait problem.       Shoulder and ankle pain  Neurological: Positive for weakness and numbness.  All other systems reviewed and are negative.      Objective:  Physical Exam Constitutional: No distress . Vital signs reviewed. HEENT: EOMI, oral membranes moist Neck: supple Cardiovascular: RRR without murmur. No JVD    Respiratory: CTA Bilaterally without wheezes or rales. Normal effort    GI: BS +, non-tender, non-distended   Skin: intact Neuro: RUE 1-2/5 prox to trace distally. RLE 2 to 2+/5 to trace to 1/5.  Tone at the right wrist and finger flexors is 2-3 out of 4.  Pronators 2 out of 4.  Biceps 2/4.  Ongoing word finding deficits but showing improvement.  still circumducts right leg. Lands foot on ground enbloc. Musculoskeletal: minimal pain.  Psych: pleasant, normal affect        Assessment & Plan:  1. Right hemiparesis, dysphagia, and aphasia secondary to large left MCA infarct with hemorrhagic transformation s/p hemicraniectomy. -Continue with HEP and outpatient therapies -DISCUSSED gait mechanics today.   Marland Kitchen 2.Spasticity:Good results after Botox to right upper extremity. -increasebaclofen with titration to 20mg  QID   -continue splint/ROM  -Will arrange dysport 1000u to RUE  Complete a letter stating patient's disability for his insurance company.  15 minutes of face to face patient care time were spent during this visit. All questions were encouraged and answered.  Follow up with me in 1 month for injections .

## 2019-08-30 NOTE — Progress Notes (Signed)
I agree with the above plan 

## 2019-09-02 LAB — FOLATE: Folate: >20 ng/mL (ref >3.0)

## 2019-09-02 LAB — HOMOCYSTEINE: Homocysteine: 8.1 umol/L (ref 0.0–14.5)

## 2019-09-02 LAB — VITAMIN B6: Vitamin B6: 76.4 ug/L — ABNORMAL HIGH (ref 5.3–46.7)

## 2019-09-02 LAB — VITAMIN B12: Vitamin B-12: 907 pg/mL (ref 232–1245)

## 2019-10-04 ENCOUNTER — Other Ambulatory Visit: Payer: Self-pay

## 2019-10-04 ENCOUNTER — Encounter: Payer: Self-pay | Admitting: Physical Medicine & Rehabilitation

## 2019-10-04 ENCOUNTER — Encounter
Payer: BC Managed Care – PPO | Attending: Physical Medicine & Rehabilitation | Admitting: Physical Medicine & Rehabilitation

## 2019-10-04 VITALS — BP 130/87 | HR 75 | Temp 98.4°F | Ht 69.0 in | Wt 240.0 lb

## 2019-10-04 DIAGNOSIS — I63412 Cerebral infarction due to embolism of left middle cerebral artery: Secondary | ICD-10-CM | POA: Diagnosis not present

## 2019-10-04 DIAGNOSIS — G8110 Spastic hemiplegia affecting unspecified side: Secondary | ICD-10-CM | POA: Diagnosis not present

## 2019-10-04 MED ORDER — BACLOFEN 20 MG PO TABS: 20.0000 mg | ORAL_TABLET | Freq: Four times a day (QID) | ORAL | 5 refills | Status: DC

## 2019-10-04 NOTE — Progress Notes (Signed)
Dysport Injection for spasticity using needle EMG guidance Indication:  Cerebral infarction due to embolism of left middle cerebral artery (HCC) s/p thrombectomy  Spastic hemiparesis (HCC)   Dilution: 500 Units/38ml        Total Units Injected:  1000 Indication: Severe spasticity which interferes with ADL,mobility and/or  hygiene and is unresponsive to medication management and other conservative care Informed consent was obtained after describing risks and benefits of the procedure with the patient. This includes bleeding, bruising, infection, excessive weakness, or medication side effects. A REMS form is on file and signed.  RUE Needle: 42mm injectable monopolar needle electrode  Number of units per muscle Pectoralis Major 0 units Pectoralis Minor 0 units Biceps 0 units Brachioradialis 0 units FCR 50 units FCU 50 units FDS 250 units FDP 250 units FPL 50 units Palmaris longus 50u Pronator Teres 200 units Pronator Quadratus 0 units Lumbricals 100 units  All injections were done after obtaining appropriate EMG activity and after negative drawback for blood. The patient tolerated the procedure well. Post procedure instructions were given.

## 2019-10-04 NOTE — Patient Instructions (Signed)
PLEASE FEEL FREE TO CALL OUR OFFICE WITH ANY PROBLEMS OR QUESTIONS ([phone])      

## 2019-10-15 ENCOUNTER — Other Ambulatory Visit: Payer: Self-pay | Admitting: Adult Health

## 2019-10-18 DIAGNOSIS — Z0289 Encounter for other administrative examinations: Secondary | ICD-10-CM

## 2019-11-13 ENCOUNTER — Ambulatory Visit: Payer: BC Managed Care – PPO | Attending: Internal Medicine

## 2019-11-13 DIAGNOSIS — Z23 Encounter for immunization: Secondary | ICD-10-CM

## 2019-11-13 NOTE — Progress Notes (Signed)
   Covid-19 Vaccination Clinic  Name:  Xavier Villegas    MRN: [id number] DOB: 22-Jul-1981  11/13/2019  Xavier Villegas was observed post Covid-19 immunization for 15 minutes without incident. He was provided with Vaccine Information Sheet and instruction to access the V-Safe system.   Xavier Villegas was instructed to call 911 with any severe reactions post vaccine: Marland Kitchen Difficulty breathing  . Swelling of face and throat  . A fast heartbeat  . A bad rash all over body  . Dizziness and weakness   Immunizations Administered    Name Date Dose VIS Date Route   Pfizer COVID-19 Vaccine 11/13/2019  9:17 AM 0.3 mL 07/21/2019 Intramuscular   Manufacturer: [place]   Lot: [id number]   [place]: [id number]

## 2019-12-12 ENCOUNTER — Ambulatory Visit: Payer: BC Managed Care – PPO | Attending: Internal Medicine

## 2019-12-12 DIAGNOSIS — Z23 Encounter for immunization: Secondary | ICD-10-CM

## 2019-12-12 NOTE — Progress Notes (Signed)
   Covid-19 Vaccination Clinic  Name:  Tevin Kritikos    MRN: [id number] DOB: January 14, 1981  12/12/2019  Mr. Rathel was observed post Covid-19 immunization for 15 minutes without incident. He was provided with Vaccine Information Sheet and instruction to access the V-Safe system.   Mr. Graper was instructed to call 911 with any severe reactions post vaccine: Marland Kitchen Difficulty breathing  . Swelling of face and throat  . A fast heartbeat  . A bad rash all over body  . Dizziness and weakness   Immunizations Administered    Name Date Dose VIS Date Route   Pfizer COVID-19 Vaccine 12/12/2019  8:30 AM 0.3 mL 10/04/2018 Intramuscular   Manufacturer: [place]   Lot: [id number]   [place]: [id number]

## 2020-01-10 ENCOUNTER — Other Ambulatory Visit: Payer: Self-pay | Admitting: Physical Medicine & Rehabilitation

## 2020-01-10 DIAGNOSIS — E539 Vitamin B deficiency, unspecified: Secondary | ICD-10-CM

## 2020-02-07 ENCOUNTER — Encounter
Payer: BC Managed Care – PPO | Attending: Physical Medicine & Rehabilitation | Admitting: Physical Medicine & Rehabilitation

## 2020-02-07 ENCOUNTER — Encounter: Payer: Self-pay | Admitting: Physical Medicine & Rehabilitation

## 2020-02-07 ENCOUNTER — Other Ambulatory Visit: Payer: Self-pay

## 2020-02-07 VITALS — BP 131/88 | HR 74 | Temp 98.3°F | Ht 69.0 in | Wt 252.0 lb

## 2020-02-07 DIAGNOSIS — I63412 Cerebral infarction due to embolism of left middle cerebral artery: Secondary | ICD-10-CM | POA: Diagnosis not present

## 2020-02-07 DIAGNOSIS — G8110 Spastic hemiplegia affecting unspecified side: Secondary | ICD-10-CM | POA: Diagnosis not present

## 2020-02-07 DIAGNOSIS — E539 Vitamin B deficiency, unspecified: Secondary | ICD-10-CM | POA: Insufficient documentation

## 2020-02-07 MED ORDER — BACLOFEN 20 MG PO TABS: 20.0000 mg | ORAL_TABLET | Freq: Four times a day (QID) | ORAL | 3 refills | Status: DC

## 2020-02-07 MED ORDER — FOLIC ACID 1 MG PO TABS: ORAL_TABLET | ORAL | 3 refills | Status: DC

## 2020-02-07 NOTE — Patient Instructions (Signed)
PLEASE FEEL FREE TO CALL OUR OFFICE WITH ANY PROBLEMS OR QUESTIONS ([phone])      

## 2020-02-07 NOTE — Progress Notes (Signed)
Dysport Injection for spasticity using needle EMG guidance Indication:  Spastic hemiparesis (HCC)  Cerebral infarction due to embolism of left middle cerebral artery (HCC) s/p thrombectomy G81.10   Dilution: 500 Units/19ml        Total Units Injected:  1000 Indication: Severe spasticity which interferes with ADL,mobility and/or  hygiene and is unresponsive to medication management and other conservative care Informed consent was obtained after describing risks and benefits of the procedure with the patient. This includes bleeding, bruising, infection, excessive weakness, or medication side effects. A REMS form is on file and signed.  right Needle: 65mm injectable monopolar needle electrode  Number of units per muscle Pectoralis Major 0 units Pectoralis Minor 0 units Biceps 0 units Brachioradialis 0 units FCR 50 units FCU 50 units FDS 200 units FDP 200 units FPL 100 units Pronator Teres 200 units Pronator Quadratus 0 units Lumbricals 100 units Right OP/FPB 100  All injections were done after obtaining appropriate EMG activity and after negative drawback for blood. The patient tolerated the procedure well. Post procedure instructions were given.   Also refilled folic acid and baclofen with 3 mos rxs  See pt back in 3 mos for dysport 1000u to RUE

## 2020-02-09 ENCOUNTER — Other Ambulatory Visit: Payer: Self-pay | Admitting: Physical Medicine & Rehabilitation

## 2020-02-09 DIAGNOSIS — E539 Vitamin B deficiency, unspecified: Secondary | ICD-10-CM

## 2020-02-09 DIAGNOSIS — I63412 Cerebral infarction due to embolism of left middle cerebral artery: Secondary | ICD-10-CM

## 2020-02-27 ENCOUNTER — Encounter: Payer: Self-pay | Admitting: Adult Health

## 2020-02-27 ENCOUNTER — Ambulatory Visit: Payer: BC Managed Care – PPO | Admitting: Adult Health

## 2020-02-27 VITALS — BP 133/82 | HR 69 | Ht 69.0 in | Wt 253.0 lb

## 2020-02-27 DIAGNOSIS — I6930 Unspecified sequelae of cerebral infarction: Secondary | ICD-10-CM

## 2020-02-27 DIAGNOSIS — I69398 Other sequelae of cerebral infarction: Secondary | ICD-10-CM

## 2020-02-27 DIAGNOSIS — E7211 Homocystinuria: Secondary | ICD-10-CM | POA: Diagnosis not present

## 2020-02-27 DIAGNOSIS — R569 Unspecified convulsions: Secondary | ICD-10-CM | POA: Diagnosis not present

## 2020-02-27 MED ORDER — LEVETIRACETAM 750 MG PO TABS: 750.0000 mg | ORAL_TABLET | Freq: Two times a day (BID) | ORAL | 3 refills | Status: DC

## 2020-02-27 NOTE — Progress Notes (Signed)
[facility] [address] [place]. [place] [address] [phone]       OFFICE FOLLOW UP NOTE  Xavier. Xavier Villegas Date of Birth:  12-Mar-1981 Medical Record Number:  [id number]   Reason for Referral: stroke follow up  CHIEF COMPLAINT:  Chief Complaint  Patient presents with   Follow-up    rm 9 here for a stoke f/u.     HPI:  Today, 02/27/2020, Xavier Villegas returns for 69-month follow-up accompanied by his father.  Stable residual deficits of right spastic hemiparesis, expressive aphasia and visual impairment.  Denies new stroke/TIA symptoms.  Continues to receive Botox injections by PMR for spasticity.  No recurrent seizure activity and continues on Keppra 750 mg twice daily tolerating well without side effects.  Continues on aspirin 325 mg daily without bleeding or bruising.  Blood pressure today 133/82.  Remains on folic acid, M09 and B6 for management of hyper homocystinemia.  Recent lab work satisfactory.  No concerns at this time.    History provided for reference purposes only Update 08/29/2019: Xavier Villegas is a 39 year old male who is being seen today for stroke and seizure follow-up.  He has been stable from a neurological standpoint with residual stroke deficits of right spastic hemiparesis, severe expressive aphasia and right homonymous hemianopia.  He was previously working with PT but this has been placed on hold back in 05/2019 due to extensive dental procedure.  He continues to do exercises at home which were recommended by therapy as well as father helping with range of motion to help with spasticity.  He recently received Botox injections by Dr. Naaman Plummer at [facility] but this also has been placed on hold due to financial and insurance reasons.  His mother does endorse ongoing improvement of right hemiparesis with improvement of gait and at times is able to ambulate without AFO brace as well as increased strength of RUE.  He has not had any reoccurring  seizure activity and continues on Keppra 750 mg twice daily tolerating well without side effects.  Continues on aspirin 325 mg daily without bleeding or bruising for secondary stroke prevention.  Blood pressure today 130/88.  He was also continued on B12, B6 and folate which was initiated during hospitalization due to evidence of hyperhomocystinemia, O70 deficiency folic acid deficiency.  Repeat labs have not been performed since hospitalization.  No further concerns at this time.  Update 02/23/2019: Xavier Villegas is a 39 year old male who is being seen today for follow-up regarding seizure activity on 10/15/2018 likely secondary to prior left MCA infarct.  He did undergo EEG on 10/31/2018 which did not show evidence of seizure activity.  He denies any recurrent seizure activity and continues on Keppra 750 mg twice daily without reported side effects. Underwent repeat carotid ultrasound on 10/27/2018 which showed bilateral ICA stenosis of 1 to 39% and VAs antegrade flow.  Residual stroke deficits right spastic hemiparesis, severe expressive aphasia and right homonymous hemianopsia stable. Continues to work with therapy thru [employer].  He was previously working with speech therapy at [facility] but has since been completed and is currently attempting to obtain speech machine.  Insurance will start paying for outpatient therapy again after 03/11/19. Blood pressure 132/91 which is normal for patient. Continues on aspirin 325mg  daily without bleeding or bruising. No further concerns at this time.   10/25/2018 visit: Xavier Villegas is a 40 year old male who is being seen today for follow-up visit after admission to ED on 10/15/2018 after witnessed seizure activity  with history of left MCA infarct with small ICH/SDH hemorrhagic conversion s/p craniotomy.  Per ED notes, consisted of "moving his right arm, sticking his tongue out and convulsing for approximately 2 to 3 minutes along with incontinence episode per mother".   Reported postictal state per mother.  He ultimately had recurrent seizure during admission lasting for less than 2 minutes.  CT head reviewed and was negative for acute intracranial abnormality and showed stable chronic large region of encephalomalacia in the left MCA territory with small chronic left frontal extra-axial collection deep to the left craniotomy. initiated Keppra 750 mg twice daily at discharge for seizure prophylaxis.  He denies any recurrent seizure activity since this time.  He has tolerated Keppra without reported side effects.  He continues to have residual stroke deficits of right spastic hemiparesis, severe expressive aphasia and right homonymous hemianopia.  No further concerns at this time.  He does continue to take aspirin 325 mg without reported side effects of bleeding or bruising.  Blood pressure today satisfactory at 128/81.  He continues to live at home but is able to maintain ADLs independently.  He was previously participating at [employer] for physical and speech therapy as his insurance will not approve additional therapy sessions at this time.  Unfortunately, due to recent pandemic, these colleges have close at this time but he plans on restarting once able.  He returns today for reevaluation and follow-up regarding recent seizure activity.   History 08/24/2018: Patient is being seen today for 18-month follow-up visit and is accompanied by his mother.  He has been doing well since prior visit with continued post stroke deficits of right spastic hemiparesis, severe expressive aphasia and right homonymous hemianopia. He continues to work with PT/OT at our neuro rehab clinic and does endorse some improvement of his hemiparesis.  He currently is ambulating with a cane but typically will not use the cane in his home unless he is going up steps.  He is currently on baclofen to assist with spasticity and he does endorse benefit from this.  He is unable to participate in further ST  sessions due to insurance reasons and plans on looking into volunteering at a speech therapy school in hopes of obtaining additional ST.  He does follow with ophthalmology for peripheral vision exams and per mother, these have been stable.  He continues on aspirin 325 mg without side effects of bleeding or bruising.  Blood pressure today satisfactory 130/85.  No further concerns at this time.  Denies new or worsening stroke/TIA symptoms.  05/17/2018 visit: Patient is being seen today for hospital follow up and is accompanied by his aunt. Underwent re-implantation of cranial flap on 05/02/18 which was tolerated well without complication.  He continues to have right hemiparesis with spasticity and expressive aphasia but does feel as though all have been improving.  He participates at our neuro rehab clinic where he receives PT/OT/ST.  He is able to ambulate with quad cane and use of foot drop brace but denies any recent falls.  He is able to say a few words but for the most part nods head appropriately.  He also continues to have right homonymous hemianopia and is unsure if there has been any improvement.  He was discharged from hospital stay on aspirin 325 and has continued to take this without side effects of bleeding or bruising.  Blood pressure today satisfactory at 125/80.  He currently is living with his father for continued assistance but is able to  bathe and dress himself independently.  Patient appears to be in good spirits regarding recovery progress and the future.  Denies any depression-like symptoms or anxiety.  No further concerns at this time.  Denies new or worsening stroke/TIA symptoms.  03/01/2018 hospital admission: Xavier Villegas a 39 y.o.malewith no significant past medical history other than tobacco usewho presented withright-sided weakness and aphasia.Hedid not receive IV t-PA due to late presentation.  CT head reviewed and showed large territory acute infarct left MCA  territory.  Due to these findings, he was taken to MRI for consideration of IR if his infarct burden appears less than estimated by CTP but unfortunately due to other patient care issues there would have been a significant delay therefore patient was taken to IR for intervention. Patient underwent attempted thrombectomy for occlusive left ICA which resulted in left ICA dissection and possible ICH.  Postprocedure CT showed subarachnoid hemorrhage with hemorrhagic transformation.  MRI head reviewed and showed large left MCA infarct with small ICH/SDH hemorrhagic conversion.  MRA head showed left ICA and MCA occlusion.  CTA head and neck showed acute dissection left internal carotid artery with intraluminal thrombus along with 2 mm midline shift.  Repeat CT showed progressive cytotoxic edema with slight mass-effect and midline shift where he required left decompressive hemicraniotomy as well as hypertonic saline for management of edema. Repeat CT scan showed improvement after treatment. Lower extremity venous Dopplers were negative for DVT.  Carotid Doppler showed left ICA occlusion.  2D echo showed an EF of 55 to 60% with aortic valve mass versus vegetation.  TEE was normal without evidence of vegetation.  Hypercoagulable work-up did show high homocystine at 181.7 and he was started on high-dose folate and B6 as well as B12 injection.  LDL 46 and A1c 4.9.  Patient was discharged to [facility] for continuation of therapy in stable condition. Patient was discharged home on 04/06/18 with 24 hr supervision.       ROS:   14 system review of systems performed and negative with exception of see HPI    PMH:  Past Medical History:  Diagnosis Date   Aphasia    GERD (gastroesophageal reflux disease)    Hemiparesis ([place]) 02/2018   right side   Hyperhomocysteinemia (HCC)    Neurogenic bladder 02/2018   04/26/18 inporving   Spastic hemiparesis (HCC)    right side   Stroke ([place])    Left MCA infart   Tobacco  abuse     PSH:  Past Surgical History:  Procedure Laterality Date   CRANIOTOMY Left 02/21/2018   Procedure: DECOMPRESSIVE CRANIECTOMY with bone flap to abdomen;  Surgeon: [name], MD;  Location: [place];  Service: Neurosurgery;  Laterality: Left;  DECOMPRESSIVE CRANIECTOMY with bone flap to abdomen   CRANIOTOMY N/A 05/02/2018   Procedure: RE-IMPLANTATION OF CRANIAL FLAP;  Surgeon: [name], MD;  Location: [place];  Service: Neurosurgery;  Laterality: N/A;  RE-IMPLANTATION OF CRANIAL FLAP   IR ANGIO INTRA EXTRACRAN SEL COM CAROTID INNOMINATE UNI R MOD SED  02/19/2018   IR ANGIO VERTEBRAL SEL VERTEBRAL BILAT MOD SED  02/19/2018   IR CT HEAD LTD  02/19/2018   IR PERCUTANEOUS ART THROMBECTOMY/INFUSION INTRACRANIAL INC DIAG ANGIO  02/19/2018   IR US GUIDE VASC ACCESS RIGHT  02/19/2018   RADIOLOGY WITH ANESTHESIA N/A 02/19/2018   Procedure: IR WITH ANESTHESIA CODE STROKE;  Surgeon: Corrie Mckusick, DO;  Location: [facility];  Service: Anesthesiology;  Laterality: N/A;   TEE WITHOUT CARDIOVERSION N/A 03/01/2018  Procedure: TRANSESOPHAGEAL ECHOCARDIOGRAM (TEE);  Surgeon: Sanda Klein, MD;  Location: [facility] ENDOSCOPY;  Service: Cardiovascular;  Laterality: N/A;    Social History:  Social History   Socioeconomic History   Marital status: Divorced    Spouse name: Not on file   Number of children: Not on file   Years of education: Not on file   Highest education level: Not on file  Occupational History   Not on file  Tobacco Use   Smoking status: Former Smoker    Packs/day: 0.50    Years: 8.00    Pack years: 4.00    Types: Cigarettes    Quit date: 02/19/2018    Years since quitting: 2.0   Smokeless tobacco: Never Used  Vaping Use   Vaping Use: Never used  Substance and Sexual Activity   Alcohol use: Not Currently    Alcohol/week: 14.0 standard drinks    Types: 14 Cans of beer per week   Drug use: Never   Sexual activity: Yes  Other Topics Concern   Not on file  Social  History Narrative   Not on file   Social Determinants of Health   Financial Resource Strain:    Difficulty of Paying Living Expenses:   Food Insecurity:    Worried About Charity fundraiser in the Last Year:    Arboriculturist in the Last Year:   Transportation Needs:    Film/video editor (Medical):    Lack of Transportation (Non-Medical):   Physical Activity:    Days of Exercise per Week:    Minutes of Exercise per Session:   Stress:    Feeling of Stress :   Social Connections:    Frequency of Communication with Friends and Family:    Frequency of Social Gatherings with Friends and Family:    Attends Religious Services:    Active Member of Clubs or Organizations:    Attends Music therapist:    Marital Status:   Intimate Partner Violence:    Fear of Current or Ex-Partner:    Emotionally Abused:    Physically Abused:    Sexually Abused:     Family History:  Family History  Problem Relation Age of Onset   Stroke Maternal Grandmother    Liver cancer Maternal Grandmother    Stroke Maternal Grandfather    Leukemia Mother        CLL/[facility]. Leretha Pol at [facility]   Diabetes Father    High blood pressure Father    Liver cancer Paternal Grandmother    Cerebral aneurysm Paternal Grandfather    Stroke Paternal Grandfather     Medications:   Current Outpatient Medications on File Prior to Visit  Medication Sig Dispense Refill   aspirin 325 MG tablet Take 1 tablet (325 mg total) by mouth daily.     baclofen (LIORESAL) 20 MG tablet Take 1 tablet (20 mg total) by mouth 4 (four) times daily. 360 each 3   cholecalciferol (VITAMIN D3) 25 MCG (1000 UT) tablet Take 1,000 Units by mouth daily.     cyanocobalamin 1000 MCG tablet Take 1 tablet (1,000 mcg total) by mouth daily. 30 tablet 2   folic acid (FOLVITE) 1 MG tablet Take 4 times daily 360 tablet 3   levETIRAcetam (KEPPRA) 750 MG tablet Take 1 tablet (750 mg total) by mouth 2 (two)  times daily. 180 tablet 3   OVER THE COUNTER MEDICATION Take 2 capsules by mouth See admin instructions. Psyllium fiber 2 capsules by mouth  everyday     pantoprazole (PROTONIX) 40 MG tablet Take 40 mg by mouth daily as needed (for GERD).      polyethylene glycol (MIRALAX / GLYCOLAX) packet Take 17 g by mouth daily as needed for mild constipation.      pyridOXINE (B-6) 50 MG tablet Take 1 tablet (50 mg total) by mouth daily. 30 tablet 2   No current facility-administered medications on file prior to visit.    Allergies:  No Known Allergies   Physical Exam  Vitals:   02/27/20 0918  BP: 133/82  Pulse: 69  Weight: 253 lb (114.8 kg)  Height: 5\' 9"  (1.753 m)   Body mass index is 37.36 kg/m. No exam data present  General: well developed, well nourished, pleasant young Caucasian male, seated, in no evident distress Head: head normocephalic and atraumatic.   Neck: supple with no carotid or supraclavicular bruits Cardiovascular: regular rate and rhythm, no murmurs Musculoskeletal: no deformity Skin:  no rash/petichiae Vascular:  Normal pulses all extremities  Neurologic Exam Mental Status: Awake and fully alert.  Severe expressive aphasia.  Nods appropriately to yes/no questions.  Oriented to place and time. Recent and remote memory intact. Attention span, concentration and fund of knowledge appropriate. Mood and affect appropriate.  Cranial Nerves: Pupils equal, briskly reactive to light. Extraocular movements full without nystagmus. Visual fields partial right homonymous hemianopia.Marland Kitchen Hearing intact.  Left-sided facial sensation decreased compared to left side.  Lower facial paralysis right side.  Motor: RUE: 3/5 with spasticity greatest in hand with limited movement; RLE: 4+/5 hip flexor, 4/5 quad, foot drop with use of AFO.  Full strength left upper and lower extremity Sensory.:  Decreased light touch, pinprick and vibratory sensation right upper and lower extremity compared to left  upper and lower extremity Coordination: Rapid alternating movements normal on left side. Finger-to-nose and heel-to-shin performed accurately on left side. Gait and Station: Arises from chair with mild difficulty. Stance is normal. Gait demonstrates hemiplegic gait with assistance of cane Reflexes: 1+ left upper and lower extremity.  2+ right upper and lower extremity.  Toes downgoing.        ASSESSMENT: Xavier Villegas is a 39 y.o. year old male here with large left MCA infarct on 02/19/18 secondary to left ICA dissection s/p TICI2b revascularization of L MCA M1/2 followed by decompressive craniectomy. Vascular risk factors include tobacco use, EtOH use, HTN and severe hyperhomocysteinemia.  He unfortunately has sustained recent seizure activity on 10/15/2018 likely related to prior large left MCA infarct and was initiated on Keppra for seizure prophylaxis.     Left MCA stroke  -Residual right spastic hemiparesis, right homonymous hemianopia and expressive aphasia -stable.  Continue to follow with PMR for spasticity management with Botox and baclofen -Continue aspirin 325 mg daily for secondary stroke prevention -Close follow-up with PCP for aggressive stroke risk factor management  Hyperhomocystinemia -Continue Z16, B6 and folic acid -Recent lab work: Folate> 20, homocystine 8.1, B12 907, and B6 76.4  Seizure, late effect of stroke -Onset 10/15/2018 -No recurrent seizure activity -Continue Keppra 750 mg twice daily for seizure prophylaxis -refill provided   Follow-up in 1 year or call earlier if needed   I spent 20 minutes of face-to-face and non-face-to-face time with patient and father.  This included previsit chart review, lab review, study review, order entry, electronic health record documentation, patient education regarding history of stroke, residual deficits, managing stroke risk factors, poststroke seizure with ongoing use of AED and answered all questions to patient  satisfaction   Frann Rider, AGNP-BC  [facility] [address] [place], [place] [address]  Phone [phone] Fax [phone] Note: This document was prepared with digital dictation and possible smart phrase technology. Any transcriptional errors that result from this process are unintentional.

## 2020-02-27 NOTE — Patient Instructions (Signed)
Continue Keppra 750 mg twice daily for seizure prophylaxis -refill provided  Continue to follow with Dr. Posey Pronto for ongoing Botox injections and management of baclofen  Continue aspirin 81 mg daily for secondary stroke prevention  Continue folic acid, Y43, B6 and vitamin D -recent lab stable  Followup in the future with me in 1 year or call earlier if needed       Thank you for coming to see [place] at [facility]. I hope we have been able to provide you high quality care today.  You may receive a patient satisfaction survey over the next few weeks. We would appreciate your feedback and comments so that we may continue to improve ourselves and the health of our patients.

## 2020-02-27 NOTE — Progress Notes (Signed)
I agree with the above plan 

## 2020-04-01 ENCOUNTER — Telehealth: Payer: Self-pay | Admitting: Physical Medicine & Rehabilitation

## 2020-04-01 ENCOUNTER — Telehealth: Payer: Self-pay | Admitting: Adult Health

## 2020-04-01 NOTE — Telephone Encounter (Signed)
Completely appropriate to excuse him from jury duty due to residual stroke deficits.  Thank you.

## 2020-04-01 NOTE — Telephone Encounter (Signed)
Patient received a juror notification and is requesting a letter stating he is unable to do jury duty.

## 2020-04-01 NOTE — Telephone Encounter (Signed)
Pt's father walked into the lobby needing a letter to get [place] out of jurry duty due to his condition. Best call back number is [phone]

## 2020-04-01 NOTE — Telephone Encounter (Signed)
I called father of pt he stated that he received in mail, from federal court/ [place] questionaire re; pt being able to be a juror.  Father to try and fax letter to me.  He stated that no juror # is listed.

## 2020-04-02 ENCOUNTER — Encounter: Payer: Self-pay | Admitting: *Deleted

## 2020-04-02 NOTE — Telephone Encounter (Signed)
I spoke to father of pt and letter was done, and faxed to [phone].  Letter did fax and he did receive. Will mail to him original as well.

## 2020-04-02 NOTE — Telephone Encounter (Signed)
Received fax.  Letter written, to JM/NP to sign.

## 2020-04-02 NOTE — Telephone Encounter (Signed)
Letter written. i'll bring it with me to the office tomorrow.

## 2020-04-02 NOTE — Telephone Encounter (Signed)
Completed and placed in out box.  Thank you. 

## 2020-05-07 DIAGNOSIS — Z0289 Encounter for other administrative examinations: Secondary | ICD-10-CM

## 2020-05-15 ENCOUNTER — Telehealth: Payer: Self-pay | Admitting: *Deleted

## 2020-05-15 ENCOUNTER — Encounter: Payer: Self-pay | Admitting: Physical Medicine & Rehabilitation

## 2020-05-15 ENCOUNTER — Encounter
Payer: BC Managed Care – PPO | Attending: Physical Medicine & Rehabilitation | Admitting: Physical Medicine & Rehabilitation

## 2020-05-15 ENCOUNTER — Other Ambulatory Visit: Payer: Self-pay

## 2020-05-15 VITALS — BP 137/92 | HR 75 | Temp 98.1°F | Ht 69.0 in | Wt 260.2 lb

## 2020-05-15 DIAGNOSIS — G8110 Spastic hemiplegia affecting unspecified side: Secondary | ICD-10-CM | POA: Diagnosis not present

## 2020-05-15 NOTE — Telephone Encounter (Signed)
Completed and placed in out box.  Thank you.

## 2020-05-15 NOTE — Telephone Encounter (Signed)
Form completed to JM/NP for review completion and signature.

## 2020-05-15 NOTE — Patient Instructions (Signed)
PLEASE FEEL FREE TO CALL OUR OFFICE WITH ANY PROBLEMS OR QUESTIONS ([phone])      

## 2020-05-15 NOTE — Progress Notes (Signed)
Dysport Injection for spasticity using needle EMG guidance Indication:  Spastic hemiparesis (HCC)   Dilution: 500 Units/91ml        Total Units Injected:  1000 is Indication: Severe spasticity which interferes with ADL,mobility and/or  hygiene and is unresponsive to medication management and other conservative care Informed consent was obtained after describing risks and benefits of the procedure with the patient. This includes bleeding, bruising, infection, excessive weakness, or medication side effects. A REMS form is on file and signed.  Right Needle: 25mm injectable monopolar needle electrode  Number of units per muscle Pectoralis Major 0 units Pectoralis Minor 0 units Biceps 0 units Brachioradialis 0 units FCR 50 units FCU 50 units FDS 250 units FDP 250 units Palmaris longus 100units FPL 100 units Pronator Teres 200 units Pronator Quadratus 0 units Lumbricals 0 units  All injections were done after obtaining appropriate EMG activity and after negative drawback for blood. The patient tolerated the procedure well. Post procedure instructions were given.

## 2020-05-16 ENCOUNTER — Telehealth: Payer: Self-pay | Admitting: Neurology

## 2020-05-16 NOTE — Telephone Encounter (Signed)
To MR. 

## 2020-05-16 NOTE — Telephone Encounter (Signed)
05/16/2020 Called patient and let him know paperwork was ready to be picked up.

## 2020-09-04 ENCOUNTER — Telehealth: Payer: Self-pay

## 2020-09-04 DIAGNOSIS — G8110 Spastic hemiplegia affecting unspecified side: Secondary | ICD-10-CM

## 2020-09-04 DIAGNOSIS — E539 Vitamin B deficiency, unspecified: Secondary | ICD-10-CM

## 2020-09-04 DIAGNOSIS — I63412 Cerebral infarction due to embolism of left middle cerebral artery: Secondary | ICD-10-CM

## 2020-09-04 MED ORDER — LEVETIRACETAM 750 MG PO TABS: 750.0000 mg | ORAL_TABLET | Freq: Two times a day (BID) | ORAL | 3 refills | Status: DC

## 2020-09-04 MED ORDER — BACLOFEN 20 MG PO TABS: 20.0000 mg | ORAL_TABLET | Freq: Four times a day (QID) | ORAL | 3 refills | Status: DC

## 2020-09-04 MED ORDER — FOLIC ACID 1 MG PO TABS: ORAL_TABLET | ORAL | 3 refills | Status: DC

## 2020-09-04 NOTE — Telephone Encounter (Signed)
Refill request Levetiracetam, Baclofen and Folic Acid

## 2020-09-11 ENCOUNTER — Encounter: Payer: Self-pay | Admitting: Physical Medicine & Rehabilitation

## 2020-09-11 ENCOUNTER — Other Ambulatory Visit: Payer: Self-pay

## 2020-09-11 ENCOUNTER — Encounter: Payer: Medicare HMO | Attending: Physical Medicine & Rehabilitation | Admitting: Physical Medicine & Rehabilitation

## 2020-09-11 VITALS — BP 138/92 | HR 82 | Temp 98.6°F | Ht 69.0 in | Wt 269.8 lb

## 2020-09-11 DIAGNOSIS — I63412 Cerebral infarction due to embolism of left middle cerebral artery: Secondary | ICD-10-CM | POA: Diagnosis not present

## 2020-09-11 DIAGNOSIS — G8110 Spastic hemiplegia affecting unspecified side: Secondary | ICD-10-CM | POA: Diagnosis not present

## 2020-09-11 DIAGNOSIS — G8111 Spastic hemiplegia affecting right dominant side: Secondary | ICD-10-CM

## 2020-09-11 NOTE — Progress Notes (Signed)
Dysport Injection for spasticity using needle EMG guidance Indication:  Cerebral infarction due to embolism of left middle cerebral artery (HCC) s/p thrombectomy  Spastic hemiparesis affecting dominant side (HCC)   Dilution: 500 Units/1ml        Total Units Injected:  1000 Indication: Severe spasticity which interferes with ADL,mobility and/or  hygiene and is unresponsive to medication management and other conservative care Informed consent was obtained after describing risks and benefits of the procedure with the patient. This includes bleeding, bruising, infection, excessive weakness, or medication side effects. A REMS form is on file and signed.  Right upper Needle: 62mm injectable monopolar needle electrode  Number of units per muscle Pectoralis Major 100 units Pectoralis Minor 200 units Biceps 0 units Brachioradialis 0 units FCR 25 units FCU 25 units FDS 200 units FDP 200 units FPL 0 units Pronator Teres 200 units Palmaris longus 50 units Pronator Quadratus 0 units Lumbricals 0 units  All injections were done after obtaining appropriate EMG activity and after negative drawback for blood. The patient tolerated the procedure well. Post procedure instructions were given.

## 2020-09-11 NOTE — Patient Instructions (Signed)
PLEASE FEEL FREE TO CALL OUR OFFICE WITH ANY PROBLEMS OR QUESTIONS ([phone])      

## 2020-12-11 ENCOUNTER — Encounter: Payer: Self-pay | Admitting: Physical Medicine & Rehabilitation

## 2020-12-11 ENCOUNTER — Encounter: Payer: Medicare HMO | Attending: Physical Medicine & Rehabilitation | Admitting: Physical Medicine & Rehabilitation

## 2020-12-11 ENCOUNTER — Other Ambulatory Visit: Payer: Self-pay

## 2020-12-11 VITALS — BP 136/84 | HR 79 | Temp 97.9°F | Ht 69.0 in | Wt 271.0 lb

## 2020-12-11 DIAGNOSIS — G8110 Spastic hemiplegia affecting unspecified side: Secondary | ICD-10-CM | POA: Insufficient documentation

## 2020-12-11 DIAGNOSIS — G8111 Spastic hemiplegia affecting right dominant side: Secondary | ICD-10-CM | POA: Diagnosis not present

## 2020-12-11 NOTE — Patient Instructions (Signed)
PLEASE FEEL FREE TO CALL OUR OFFICE WITH ANY PROBLEMS OR QUESTIONS ([phone])      

## 2020-12-11 NOTE — Progress Notes (Signed)
Dysport Injection for spasticity using needle EMG guidance Indication:  Spastic hemiparesis affecting dominant side (HCC)   Dilution: 500 Units/20ml        Total Units Injected:  1000 Indication: Severe spasticity which interferes with ADL,mobility and/or  hygiene and is unresponsive to medication management and other conservative care Informed consent was obtained after describing risks and benefits of the procedure with the patient. This includes bleeding, bruising, infection, excessive weakness, or medication side effects. A REMS form is on file and signed.  Right side Needle: 40mm injectable monopolar needle electrode  Number of units per muscle Pectoralis Major 0 units Pectoralis Minor 0 units Biceps 0 units Brachioradialis 0 units FCR 100 units FCU 100 units FDS 250 units FDP 250 units FPL 0 units Pronator Teres 300 units Pronator Quadratus 0 units Lumbricals 0 units  All injections were done after obtaining appropriate EMG activity and after negative drawback for blood. The patient tolerated the procedure well. Post procedure instructions were given.  F/u in 4 mos.

## 2021-01-10 ENCOUNTER — Telehealth: Payer: Self-pay | Admitting: Physical Medicine & Rehabilitation

## 2021-01-10 DIAGNOSIS — I63412 Cerebral infarction due to embolism of left middle cerebral artery: Secondary | ICD-10-CM

## 2021-01-10 DIAGNOSIS — G8110 Spastic hemiplegia affecting unspecified side: Secondary | ICD-10-CM

## 2021-01-10 NOTE — Telephone Encounter (Signed)
[name] father stopped in and would like a referral for Physical therapy and has provided a list of [facility] providers close to the patient.  Please advise if we can get this order in for him and where he should go for the PT.

## 2021-01-10 NOTE — Telephone Encounter (Signed)
I have made a referral to the location most likely to meet his needs.

## 2021-01-10 NOTE — Addendum Note (Signed)
Addended by: Alger Simons T on: 01/10/2021 12:07 PM   Modules accepted: Orders

## 2021-01-28 DIAGNOSIS — R531 Weakness: Secondary | ICD-10-CM | POA: Diagnosis not present

## 2021-01-28 DIAGNOSIS — G8191 Hemiplegia, unspecified affecting right dominant side: Secondary | ICD-10-CM | POA: Diagnosis not present

## 2021-01-29 DIAGNOSIS — R531 Weakness: Secondary | ICD-10-CM | POA: Diagnosis not present

## 2021-01-29 DIAGNOSIS — G8191 Hemiplegia, unspecified affecting right dominant side: Secondary | ICD-10-CM | POA: Diagnosis not present

## 2021-02-04 DIAGNOSIS — G8191 Hemiplegia, unspecified affecting right dominant side: Secondary | ICD-10-CM | POA: Diagnosis not present

## 2021-02-04 DIAGNOSIS — R531 Weakness: Secondary | ICD-10-CM | POA: Diagnosis not present

## 2021-02-06 DIAGNOSIS — G8191 Hemiplegia, unspecified affecting right dominant side: Secondary | ICD-10-CM | POA: Diagnosis not present

## 2021-02-06 DIAGNOSIS — R531 Weakness: Secondary | ICD-10-CM | POA: Diagnosis not present

## 2021-02-11 DIAGNOSIS — R531 Weakness: Secondary | ICD-10-CM | POA: Diagnosis not present

## 2021-02-11 DIAGNOSIS — G8191 Hemiplegia, unspecified affecting right dominant side: Secondary | ICD-10-CM | POA: Diagnosis not present

## 2021-02-12 DIAGNOSIS — R531 Weakness: Secondary | ICD-10-CM | POA: Diagnosis not present

## 2021-02-12 DIAGNOSIS — G8191 Hemiplegia, unspecified affecting right dominant side: Secondary | ICD-10-CM | POA: Diagnosis not present

## 2021-02-14 DIAGNOSIS — R531 Weakness: Secondary | ICD-10-CM | POA: Diagnosis not present

## 2021-02-14 DIAGNOSIS — G8191 Hemiplegia, unspecified affecting right dominant side: Secondary | ICD-10-CM | POA: Diagnosis not present

## 2021-02-17 DIAGNOSIS — G8191 Hemiplegia, unspecified affecting right dominant side: Secondary | ICD-10-CM | POA: Diagnosis not present

## 2021-02-17 DIAGNOSIS — R531 Weakness: Secondary | ICD-10-CM | POA: Diagnosis not present

## 2021-02-19 DIAGNOSIS — R531 Weakness: Secondary | ICD-10-CM | POA: Diagnosis not present

## 2021-02-19 DIAGNOSIS — G8191 Hemiplegia, unspecified affecting right dominant side: Secondary | ICD-10-CM | POA: Diagnosis not present

## 2021-02-21 DIAGNOSIS — R531 Weakness: Secondary | ICD-10-CM | POA: Diagnosis not present

## 2021-02-21 DIAGNOSIS — G8191 Hemiplegia, unspecified affecting right dominant side: Secondary | ICD-10-CM | POA: Diagnosis not present

## 2021-02-24 DIAGNOSIS — R531 Weakness: Secondary | ICD-10-CM | POA: Diagnosis not present

## 2021-02-24 DIAGNOSIS — G8191 Hemiplegia, unspecified affecting right dominant side: Secondary | ICD-10-CM | POA: Diagnosis not present

## 2021-02-26 DIAGNOSIS — G8191 Hemiplegia, unspecified affecting right dominant side: Secondary | ICD-10-CM | POA: Diagnosis not present

## 2021-02-26 DIAGNOSIS — R531 Weakness: Secondary | ICD-10-CM | POA: Diagnosis not present

## 2021-02-27 ENCOUNTER — Encounter: Payer: Self-pay | Admitting: Adult Health

## 2021-02-27 ENCOUNTER — Ambulatory Visit: Payer: Medicare HMO | Admitting: Adult Health

## 2021-02-27 ENCOUNTER — Other Ambulatory Visit: Payer: Self-pay

## 2021-02-27 VITALS — BP 146/88 | HR 89 | Ht 69.0 in | Wt 274.4 lb

## 2021-02-27 DIAGNOSIS — E7211 Homocystinuria: Secondary | ICD-10-CM | POA: Diagnosis not present

## 2021-02-27 DIAGNOSIS — Z5181 Encounter for therapeutic drug level monitoring: Secondary | ICD-10-CM | POA: Diagnosis not present

## 2021-02-27 DIAGNOSIS — I6930 Unspecified sequelae of cerebral infarction: Secondary | ICD-10-CM

## 2021-02-27 DIAGNOSIS — I69398 Other sequelae of cerebral infarction: Secondary | ICD-10-CM

## 2021-02-27 DIAGNOSIS — R569 Unspecified convulsions: Secondary | ICD-10-CM | POA: Diagnosis not present

## 2021-02-27 NOTE — Patient Instructions (Addendum)
Continue Keppra 750 mg twice daily for seizure prevention  Continue aspirin 81 mg daily for secondary stroke prevention  Continue to follow up with PCP regarding blood pressure and management  Maintain strict control of hypertension with blood pressure goal below 130/90  We will check levels today to ensure adequate management of B12, B6 and folate    Followup in the future with me in 1 year or call earlier if needed       Thank you for coming to see [place] at [facility]. I hope we have been able to provide you high quality care today.  You may receive a patient satisfaction survey over the next few weeks. We would appreciate your feedback and comments so that we may continue to improve ourselves and the health of our patients.

## 2021-02-27 NOTE — Progress Notes (Signed)
[facility] [address] [place]. [place] [address] [phone]       OFFICE FOLLOW UP NOTE  [name] Date of Birth:  1981-06-08 Medical Record Number:  [id number]   Reason for visit: stroke and seizure follow up  CHIEF COMPLAINT:  Chief Complaint  Patient presents with   Cerebrovascular Accident    Rm 2, one yr FU  dad- Chrissie Noa, mom- Izora Gala "no new concerns, in PT 3 days/week, saying more words"      HPI:  Today, 02/27/2021, [name] returns for yearly stroke and seizure follow-up accompanied by his mother and father.  He has been stable since prior visit without new stroke/TIA symptoms or seizure activity.  Residual stroke deficits stable without worsening.  Continues to receive Botox by Dr. Naaman Plummer [facility] for right spastic hemiparesis with last injection on 5/4.  He has also been working with [facility] in [place], [place] with noted improvement of strength and spasticity.  Per father, his speech is also been improving at PT works with him on language exercises as well.  Compliant on aspirin without associated side effects.  Compliant on Keppra 750 mg twice daily tolerating without side effects.  Blood pressure today 146/88.  No new concerns at this time.    History provided for reference purposes only Update 02/27/2020 JM: [name] returns for 29-month follow-up accompanied by his father.  Stable residual deficits of right spastic hemiparesis, expressive aphasia and visual impairment.  Denies new stroke/TIA symptoms.  Continues to receive Botox injections by PMR for spasticity.  No recurrent seizure activity and continues on Keppra 750 mg twice daily tolerating well without side effects.  Continues on aspirin 325 mg daily without bleeding or bruising.  Blood pressure today 133/82.  Remains on folic acid, Q00 and B6 for management of hyper homocystinemia.  Recent lab work satisfactory.  No concerns at this time.  Update 08/29/2019: [name] is a  40 year old male who is being seen today for stroke and seizure follow-up.  He has been stable from a neurological standpoint with residual stroke deficits of right spastic hemiparesis, severe expressive aphasia and right homonymous hemianopia.  He was previously working with PT but this has been placed on hold back in 05/2019 due to extensive dental procedure.  He continues to do exercises at home which were recommended by therapy as well as father helping with range of motion to help with spasticity.  He recently received Botox injections by Dr. Naaman Plummer at [facility] but this also has been placed on hold due to financial and insurance reasons.  His mother does endorse ongoing improvement of right hemiparesis with improvement of gait and at times is able to ambulate without AFO brace as well as increased strength of RUE.  He has not had any reoccurring seizure activity and continues on Keppra 750 mg twice daily tolerating well without side effects.  Continues on aspirin 325 mg daily without bleeding or bruising for secondary stroke prevention.  Blood pressure today 130/88.  He was also continued on B12, B6 and folate which was initiated during hospitalization due to evidence of hyperhomocystinemia, Q67 deficiency folic acid deficiency.  Repeat labs have not been performed since hospitalization.  No further concerns at this time.  Update 02/23/2019: [name] is a 41 year old male who is being seen today for follow-up regarding seizure activity on 10/15/2018 likely secondary to prior left MCA infarct.  He did undergo EEG on 10/31/2018 which did not show evidence of seizure activity.  He denies any recurrent seizure activity and continues on Keppra 750 mg twice daily without reported side effects. Underwent repeat carotid ultrasound on 10/27/2018 which showed bilateral ICA stenosis of 1 to 39% and VAs antegrade flow.  Residual stroke deficits right spastic hemiparesis, severe expressive aphasia and right homonymous  hemianopsia stable. Continues to work with therapy thru [employer].  He was previously working with speech therapy at [facility] but has since been completed and is currently attempting to obtain speech machine.  Insurance will start paying for outpatient therapy again after 03/11/19. Blood pressure 132/91 which is normal for patient. Continues on aspirin 325mg  daily without bleeding or bruising. No further concerns at this time.   10/25/2018 visit: [name] is a 40 year old male who is being seen today for follow-up visit after admission to ED on 10/15/2018 after witnessed seizure activity with history of left MCA infarct with small ICH/SDH hemorrhagic conversion s/p craniotomy.  Per ED notes, consisted of "moving his right arm, sticking his tongue out and convulsing for approximately 2 to 3 minutes along with incontinence episode per mother".  Reported postictal state per mother.  He ultimately had recurrent seizure during admission lasting for less than 2 minutes.  CT head reviewed and was negative for acute intracranial abnormality and showed stable chronic large region of encephalomalacia in the left MCA territory with small chronic left frontal extra-axial collection deep to the left craniotomy. initiated Keppra 750 mg twice daily at discharge for seizure prophylaxis.  He denies any recurrent seizure activity since this time.  He has tolerated Keppra without reported side effects.  He continues to have residual stroke deficits of right spastic hemiparesis, severe expressive aphasia and right homonymous hemianopia.  No further concerns at this time.  He does continue to take aspirin 325 mg without reported side effects of bleeding or bruising.  Blood pressure today satisfactory at 128/81.  He continues to live at home but is able to maintain ADLs independently.  He was previously participating at [employer] for physical and speech therapy as his insurance will not approve additional therapy  sessions at this time.  Unfortunately, due to recent pandemic, these colleges have close at this time but he plans on restarting once able.  He returns today for reevaluation and follow-up regarding recent seizure activity.   History 08/24/2018: Patient is being seen today for 29-month follow-up visit and is accompanied by his mother.  He has been doing well since prior visit with continued post stroke deficits of right spastic hemiparesis, severe expressive aphasia and right homonymous hemianopia. He continues to work with PT/OT at our neuro rehab clinic and does endorse some improvement of his hemiparesis.  He currently is ambulating with a cane but typically will not use the cane in his home unless he is going up steps.  He is currently on baclofen to assist with spasticity and he does endorse benefit from this.  He is unable to participate in further ST sessions due to insurance reasons and plans on looking into volunteering at a speech therapy school in hopes of obtaining additional ST.  He does follow with ophthalmology for peripheral vision exams and per mother, these have been stable.  He continues on aspirin 325 mg without side effects of bleeding or bruising.  Blood pressure today satisfactory 130/85.  No further concerns at this time.  Denies new or worsening stroke/TIA symptoms.  05/17/2018 visit: Patient is being seen today for hospital follow up and is accompanied by his aunt.  Underwent re-implantation of cranial flap on 05/02/18 which was tolerated well without complication.  He continues to have right hemiparesis with spasticity and expressive aphasia but does feel as though all have been improving.  He participates at our neuro rehab clinic where he receives PT/OT/ST.  He is able to ambulate with quad cane and use of foot drop brace but denies any recent falls.  He is able to say a few words but for the most part nods head appropriately.  He also continues to have right homonymous hemianopia and is  unsure if there has been any improvement.  He was discharged from hospital stay on aspirin 325 and has continued to take this without side effects of bleeding or bruising.  Blood pressure today satisfactory at 125/80.  He currently is living with his father for continued assistance but is able to bathe and dress himself independently.  Patient appears to be in good spirits regarding recovery progress and the future.  Denies any depression-like symptoms or anxiety.  No further concerns at this time.  Denies new or worsening stroke/TIA symptoms.  03/01/2018 hospital admission: [name] is a 40 y.o. male with no significant past medical history other than tobacco use who presented with right-sided weakness and aphasia. He did not receive IV t-PA due to late presentation.  CT head reviewed and showed large territory acute infarct left MCA territory.  Due to these findings, he was taken to MRI for consideration of IR if his infarct burden appears less than estimated by CTP but unfortunately due to other patient care issues there would have been a significant delay therefore patient was taken to IR for intervention.  Patient underwent attempted thrombectomy for occlusive left ICA which resulted in left ICA dissection and possible ICH.  Postprocedure CT showed subarachnoid hemorrhage with hemorrhagic transformation.  MRI head reviewed and showed large left MCA infarct with small ICH/SDH hemorrhagic conversion.  MRA head showed left ICA and MCA occlusion.  CTA head and neck showed acute dissection left internal carotid artery with intraluminal thrombus along with 2 mm midline shift.  Repeat CT showed progressive cytotoxic edema with slight mass-effect and midline shift where he required left decompressive hemicraniotomy as well as hypertonic saline for management of edema. Repeat CT scan showed improvement after treatment. Lower extremity venous Dopplers were negative for DVT.  Carotid Doppler showed  left ICA occlusion.  2D echo showed an EF of 55 to 60% with aortic valve mass versus vegetation.  TEE was normal without evidence of vegetation.  Hypercoagulable work-up did show high homocystine at 181.7 and he was started on high-dose folate and B6 as well as B12 injection.  LDL 46 and A1c 4.9.  Patient was discharged to [facility] for continuation of therapy in stable condition. Patient was discharged home on 04/06/18 with 24 hr supervision.       ROS:   14 system review of systems performed and negative with exception of see HPI    PMH:  Past Medical History:  Diagnosis Date   Aphasia    GERD (gastroesophageal reflux disease)    Hemiparesis ([place]) 02/2018   right side   Hyperhomocysteinemia (HCC)    Neurogenic bladder 02/2018   04/26/18 inporving   Spastic hemiparesis (HCC)    right side   Stroke (HCC)    Left MCA infart   Tobacco abuse     PSH:  Past Surgical History:  Procedure Laterality Date   CRANIOTOMY Left 02/21/2018   Procedure: DECOMPRESSIVE CRANIECTOMY  with bone flap to abdomen;  Surgeon: [name], MD;  Location: [place];  Service: Neurosurgery;  Laterality: Left;  DECOMPRESSIVE CRANIECTOMY with bone flap to abdomen   CRANIOTOMY N/A 05/02/2018   Procedure: RE-IMPLANTATION OF CRANIAL FLAP;  Surgeon: [name], MD;  Location: [place];  Service: Neurosurgery;  Laterality: N/A;  RE-IMPLANTATION OF CRANIAL FLAP   IR ANGIO INTRA EXTRACRAN SEL COM CAROTID INNOMINATE UNI R MOD SED  02/19/2018   IR ANGIO VERTEBRAL SEL VERTEBRAL BILAT MOD SED  02/19/2018   IR CT HEAD LTD  02/19/2018   IR PERCUTANEOUS ART THROMBECTOMY/INFUSION INTRACRANIAL INC DIAG ANGIO  02/19/2018   IR US GUIDE VASC ACCESS RIGHT  02/19/2018   RADIOLOGY WITH ANESTHESIA N/A 02/19/2018   Procedure: IR WITH ANESTHESIA CODE STROKE;  Surgeon: Corrie Mckusick, DO;  Location: [place];  Service: Anesthesiology;  Laterality: N/A;   TEE WITHOUT CARDIOVERSION N/A 03/01/2018   Procedure: TRANSESOPHAGEAL ECHOCARDIOGRAM (TEE);  Surgeon:  Sanda Klein, MD;  Location: [facility];  Service: Cardiovascular;  Laterality: N/A;    Social History:  Social History   Socioeconomic History   Marital status: Divorced    Spouse name: Not on file   Number of children: Not on file   Years of education: Not on file   Highest education level: Not on file  Occupational History   Not on file  Tobacco Use   Smoking status: Former    Packs/day: 0.50    Years: 8.00    Pack years: 4.00    Types: Cigarettes    Quit date: 02/19/2018    Years since quitting: 3.0   Smokeless tobacco: Never  Vaping Use   Vaping Use: Never used  Substance and Sexual Activity   Alcohol use: Not Currently    Alcohol/week: 14.0 standard drinks    Types: 14 Cans of beer per week   Drug use: Never   Sexual activity: Yes  Other Topics Concern   Not on file  Social History Narrative   Not on file   Social Determinants of Health   Financial Resource Strain: Not on file  Food Insecurity: Not on file  Transportation Needs: Not on file  Physical Activity: Not on file  Stress: Not on file  Social Connections: Not on file  Intimate Partner Violence: Not on file    Family History:  Family History  Problem Relation Age of Onset   Stroke Maternal Grandmother    Liver cancer Maternal Grandmother    Stroke Maternal Grandfather    Leukemia Mother        CLL/Dr. Leretha Pol at [facility]   Diabetes Father    High blood pressure Father    Liver cancer Paternal Grandmother    Cerebral aneurysm Paternal Grandfather    Stroke Paternal Grandfather     Medications:   Current Outpatient Medications on File Prior to Visit  Medication Sig Dispense Refill   aspirin 325 MG tablet Take 1 tablet (325 mg total) by mouth daily.     baclofen (LIORESAL) 20 MG tablet Take 1 tablet (20 mg total) by mouth 4 (four) times daily. 360 each 3   cholecalciferol (VITAMIN D3) 25 MCG (1000 UT) tablet Take 1,000 Units by mouth daily.     cyanocobalamin 1000 MCG tablet Take 1 tablet  (1,000 mcg total) by mouth daily. 30 tablet 2   folic acid (FOLVITE) 1 MG tablet Take 4 times daily 360 tablet 3   levETIRAcetam (KEPPRA) 750 MG tablet Take 1 tablet (750 mg total) by  mouth 2 (two) times daily. 180 tablet 3   OVER THE COUNTER MEDICATION Take 2 capsules by mouth See admin instructions. Psyllium fiber 2 capsules by mouth everyday     pantoprazole (PROTONIX) 40 MG tablet Take 40 mg by mouth daily as needed (for GERD).      polyethylene glycol (MIRALAX / GLYCOLAX) packet Take 17 g by mouth daily as needed for mild constipation.      pyridOXINE (B-6) 50 MG tablet Take 1 tablet (50 mg total) by mouth daily. 30 tablet 2   No current facility-administered medications on file prior to visit.    Allergies:  No Known Allergies   Physical Exam  Vitals:   02/27/21 1304  BP: (!) 146/88  Pulse: 89  Weight: 274 lb 6.4 oz (124.5 kg)  Height: 5\' 9"  (1.753 m)    Body mass index is 40.52 kg/m. No results found.  General: well developed, well nourished, pleasant young Caucasian male, seated, in no evident distress Head: head normocephalic and atraumatic.   Neck: supple with no carotid or supraclavicular bruits Cardiovascular: regular rate and rhythm, no murmurs Musculoskeletal: no deformity Skin:  no rash/petichiae Vascular:  Normal pulses all extremities  Neurologic Exam Mental Status: Awake and fully alert.  Severe expressive aphasia.  Nods appropriately to yes/no questions - able to say simple words.  Oriented to place and time. Recent and remote memory intact. Attention span, concentration and fund of knowledge appropriate. Mood and affect appropriate.  Cranial Nerves: Pupils equal, briskly reactive to light. Extraocular movements full without nystagmus. Visual fields partial right homonymous hemianopia.Marland Kitchen Hearing intact.  Left-sided facial sensation decreased compared to left side.  Lower facial paralysis right side.  Motor: RUE: 4/5 with spasticity throughout greatest in hand,  RLE: 4+/5 hip flexor, 4/5 quad, foot drop with use of AFO.  Full strength left upper and lower extremity Sensory.:  Decreased light touch, pinprick and vibratory sensation right upper and lower extremity compared to left upper and lower extremity Coordination: Rapid alternating movements normal on left side. Finger-to-nose and heel-to-shin performed accurately on left side. Gait and Station: Arises from chair without difficulty. Stance is normal. Gait demonstrates hemiplegic gait with use of cane and AFO device Reflexes: 1+ left upper and lower extremity.  2+ right upper and lower extremity.  Toes downgoing.        ASSESSMENT: Xavier Villegas is a 40 y.o. year old male here with large left MCA infarct on 02/19/18 secondary to left ICA dissection s/p [id number] revascularization of L MCA M1/2 followed by decompressive craniectomy. Vascular risk factors include tobacco use, EtOH use, HTN and severe hyperhomocysteinemia.  He unfortunately has sustained recent seizure activity on 10/15/2018 likely related to prior large left MCA infarct and was initiated on Keppra for seizure prophylaxis.     Left MCA stroke  -Residual right spastic hemiparesis, right homonymous hemianopia and expressive aphasia -stable.  Continue to follow with PMR for spasticity management with Botox and baclofen as well as continued participation with [facility] -Continue aspirin 325 mg daily for secondary stroke prevention -Close follow-up with PCP for aggressive stroke risk factor management  Hyperhomocystinemia -Continue N02, B6 and folic acid -Lab work (02/2535): Folate> 20, homocystine 8.1, B12 907, and B6 76.4 -Repeat lab work today  Seizure, late effect of stroke -Onset 10/15/2018 -No recurrent seizure activity -Continue Keppra 750 mg twice daily for seizure prophylaxis -refill up-to-date -Obtain CBC and CMP as not recently completed   Follow-up in 1 year or call earlier if  needed   I spent 32 minutes of  face-to-face and non-face-to-face time with patient, mother and father.  This included previsit chart review, lab review, study review, order entry, electronic health record documentation, patient education regarding history of stroke, residual deficits, managing stroke risk factors, poststroke seizure with ongoing use of AED and answered all questions to patient satisfaction   Frann Rider, [facility]  [facility] [address] [place], [place] [address]  Phone [phone] Fax [phone] Note: This document was prepared with digital dictation and possible smart phrase technology. Any transcriptional errors that result from this process are unintentional.

## 2021-02-28 DIAGNOSIS — G8191 Hemiplegia, unspecified affecting right dominant side: Secondary | ICD-10-CM | POA: Diagnosis not present

## 2021-02-28 DIAGNOSIS — R531 Weakness: Secondary | ICD-10-CM | POA: Diagnosis not present

## 2021-02-28 LAB — CBC
Hematocrit: 48.6 % (ref 37.5–51.0)
Hemoglobin: 17 g/dL (ref 13.0–17.7)
MCH: 31.5 pg (ref 26.6–33.0)
MCHC: 35 g/dL (ref 31.5–35.7)
MCV: 90 fL (ref 79–97)
Platelets: 290 10*3/uL (ref 150–450)
RBC: 5.39 x10E6/uL (ref 4.14–5.80)
RDW: 12.1 % (ref 11.6–15.4)
WBC: 10.8 10*3/uL (ref 3.4–10.8)

## 2021-02-28 LAB — B12 AND FOLATE PANEL
Folate: 19.7 ng/mL (ref >3.0)
Vitamin B-12: 980 pg/mL (ref 232–1245)

## 2021-02-28 LAB — COMPREHENSIVE METABOLIC PANEL
ALT: 34 IU/L (ref 0–44)
AST: 23 IU/L (ref 0–40)
Albumin/Globulin Ratio: 1.6 (ref 1.2–2.2)
Albumin: 4.5 g/dL (ref 4.0–5.0)
Alkaline Phosphatase: 129 IU/L — ABNORMAL HIGH (ref 44–121)
BUN/Creatinine Ratio: 6 — ABNORMAL LOW (ref 9–20)
BUN: 6 mg/dL (ref 6–20)
Bilirubin Total: 0.8 mg/dL (ref 0.0–1.2)
CO2: 26 mmol/L (ref 20–29)
Calcium: 9.5 mg/dL (ref 8.7–10.2)
Chloride: 100 mmol/L (ref 96–106)
Creatinine, Ser: 1.01 mg/dL (ref 0.76–1.27)
Globulin, Total: 2.9 g/dL (ref 1.5–4.5)
Glucose: 80 mg/dL (ref 65–99)
Potassium: 5.3 mmol/L — ABNORMAL HIGH (ref 3.5–5.2)
Sodium: 144 mmol/L (ref 134–144)
Total Protein: 7.4 g/dL (ref 6.0–8.5)
eGFR: 98 mL/min/{1.73_m2} (ref >59)

## 2021-02-28 NOTE — Progress Notes (Signed)
I agree with the above plan 

## 2021-03-03 DIAGNOSIS — G8191 Hemiplegia, unspecified affecting right dominant side: Secondary | ICD-10-CM | POA: Diagnosis not present

## 2021-03-03 DIAGNOSIS — R531 Weakness: Secondary | ICD-10-CM | POA: Diagnosis not present

## 2021-03-05 DIAGNOSIS — R531 Weakness: Secondary | ICD-10-CM | POA: Diagnosis not present

## 2021-03-05 DIAGNOSIS — G8191 Hemiplegia, unspecified affecting right dominant side: Secondary | ICD-10-CM | POA: Diagnosis not present

## 2021-03-07 ENCOUNTER — Telehealth: Payer: Self-pay | Admitting: Physical Medicine & Rehabilitation

## 2021-03-07 DIAGNOSIS — I63412 Cerebral infarction due to embolism of left middle cerebral artery: Secondary | ICD-10-CM

## 2021-03-07 DIAGNOSIS — R4701 Aphasia: Secondary | ICD-10-CM

## 2021-03-07 DIAGNOSIS — R531 Weakness: Secondary | ICD-10-CM | POA: Diagnosis not present

## 2021-03-07 DIAGNOSIS — G8191 Hemiplegia, unspecified affecting right dominant side: Secondary | ICD-10-CM | POA: Diagnosis not present

## 2021-03-07 NOTE — Telephone Encounter (Signed)
Patient and father came into office asking for an order for speech therapy for [facility] on [address].

## 2021-03-07 NOTE — Telephone Encounter (Signed)
SLP ordered

## 2021-03-10 DIAGNOSIS — G8191 Hemiplegia, unspecified affecting right dominant side: Secondary | ICD-10-CM | POA: Diagnosis not present

## 2021-03-10 DIAGNOSIS — R531 Weakness: Secondary | ICD-10-CM | POA: Diagnosis not present

## 2021-03-12 DIAGNOSIS — R531 Weakness: Secondary | ICD-10-CM | POA: Diagnosis not present

## 2021-03-12 DIAGNOSIS — G8191 Hemiplegia, unspecified affecting right dominant side: Secondary | ICD-10-CM | POA: Diagnosis not present

## 2021-03-14 DIAGNOSIS — R531 Weakness: Secondary | ICD-10-CM | POA: Diagnosis not present

## 2021-03-14 DIAGNOSIS — G8191 Hemiplegia, unspecified affecting right dominant side: Secondary | ICD-10-CM | POA: Diagnosis not present

## 2021-03-17 ENCOUNTER — Ambulatory Visit: Payer: Medicare HMO

## 2021-03-17 DIAGNOSIS — R531 Weakness: Secondary | ICD-10-CM | POA: Diagnosis not present

## 2021-03-17 DIAGNOSIS — G8191 Hemiplegia, unspecified affecting right dominant side: Secondary | ICD-10-CM | POA: Diagnosis not present

## 2021-03-19 DIAGNOSIS — R531 Weakness: Secondary | ICD-10-CM | POA: Diagnosis not present

## 2021-03-19 DIAGNOSIS — G8191 Hemiplegia, unspecified affecting right dominant side: Secondary | ICD-10-CM | POA: Diagnosis not present

## 2021-03-20 DIAGNOSIS — R531 Weakness: Secondary | ICD-10-CM | POA: Diagnosis not present

## 2021-03-20 DIAGNOSIS — G8191 Hemiplegia, unspecified affecting right dominant side: Secondary | ICD-10-CM | POA: Diagnosis not present

## 2021-03-21 ENCOUNTER — Ambulatory Visit: Payer: Medicare HMO | Attending: Physical Medicine & Rehabilitation

## 2021-03-21 ENCOUNTER — Other Ambulatory Visit: Payer: Self-pay

## 2021-03-21 DIAGNOSIS — R4701 Aphasia: Secondary | ICD-10-CM | POA: Insufficient documentation

## 2021-03-21 DIAGNOSIS — R482 Apraxia: Secondary | ICD-10-CM | POA: Insufficient documentation

## 2021-03-21 NOTE — Therapy (Signed)
[facility] [address] [place], [place], [address] Phone: [phone]   Fax:  [phone]  Speech Language Pathology Evaluation  Patient Details  Name: Xavier Villegas MRN: [id number] Date of Birth: February 24, 1981 Referring Provider (SLP): Meredith Staggers, MD   Encounter Date: 03/21/2021   End of Session - 03/21/21 1741     Visit Number 1    Number of Visits 17    Date for SLP Re-Evaluation 05/16/21    Authorization Type [employer]    SLP Start Time 1530    SLP Stop Time  [id number]    SLP Time Calculation (min) 45 min    Activity Tolerance Patient tolerated treatment well             Past Medical History:  Diagnosis Date   Aphasia    GERD (gastroesophageal reflux disease)    Hemiparesis ([place]) 02/2018   right side   Hyperhomocysteinemia ([place])    Neurogenic bladder 02/2018   04/26/18 inporving   Spastic hemiparesis ([place])    right side   Stroke ([place])    Left MCA infart   Tobacco abuse     Past Surgical History:  Procedure Laterality Date   CRANIOTOMY Left 02/21/2018   Procedure: DECOMPRESSIVE CRANIECTOMY with bone flap to abdomen;  Surgeon: [name], MD;  Location: [facility];  Service: Neurosurgery;  Laterality: Left;  DECOMPRESSIVE CRANIECTOMY with bone flap to abdomen   CRANIOTOMY N/A 05/02/2018   Procedure: RE-IMPLANTATION OF CRANIAL FLAP;  Surgeon: [name], MD;  Location: [facility];  Service: Neurosurgery;  Laterality: N/A;  RE-IMPLANTATION OF CRANIAL FLAP   IR ANGIO INTRA EXTRACRAN SEL COM CAROTID INNOMINATE UNI R MOD SED  02/19/2018   IR ANGIO VERTEBRAL SEL VERTEBRAL BILAT MOD SED  02/19/2018   IR CT HEAD LTD  02/19/2018   IR PERCUTANEOUS ART THROMBECTOMY/INFUSION INTRACRANIAL INC DIAG ANGIO  02/19/2018   IR US GUIDE VASC ACCESS RIGHT  02/19/2018   RADIOLOGY WITH ANESTHESIA N/A 02/19/2018   Procedure: IR WITH ANESTHESIA CODE STROKE;  Surgeon: Corrie Mckusick, DO;  Location: [facility];  Service:  Anesthesiology;  Laterality: N/A;   TEE WITHOUT CARDIOVERSION N/A 03/01/2018   Procedure: TRANSESOPHAGEAL ECHOCARDIOGRAM (TEE);  Surgeon: Sanda Klein, MD;  Location[facility] ENDOSCOPY;  Service: Cardiovascular;  Laterality: N/A;    There were no vitals filed for this visit.       SLP Evaluation [facility] - 03/21/21 1530       SLP Visit Information   SLP Received On 03/07/21    Referring Provider (SLP) Meredith Staggers, MD    Onset Date July 2019    Medical Diagnosis Global aphasia   Cerebral infarction due to embolism of left middle cerebral artery [facility])     Subjective   Patient/Family Stated Goal To get back talking      General Information   HPI Xavier Villegas had a stroke in June 2019. He has been stable without new stroke/TIA symptoms or seizure activity.  Residual stroke deficits stable without worsening.    He has also been working with [facility] in [place], [place] with noted improvement of strength and spasticity. 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     Cognition   Overall Cognitive Status Difficult to assess    Difficult to assess due to Impaired communication      Auditory Comprehension   Overall Auditory Comprehension Impaired    Yes/No Questions Impaired    Complex Questions 75-100% accurate    Commands Impaired    One Step Basic Commands 75-100% accurate    Conversation Moderately complex    Interfering Components Motor planning;Processing speed    EffectiveTechniques Extra processing time;Repetition      Expression   Primary Mode of Expression Nonverbal - gestures   mostly gestures, some verbal according  to family     Verbal Expression   Overall Verbal Expression Impaired    Initiation Impaired    Automatic Speech Name    Level of Generative/Spontaneous Verbalization Word    Repetition Impaired    Level of Impairment Word level    Naming Impairment    Responsive 0-25% accurate    Confrontation 0-24% accurate    Convergent Not tested    Divergent Not tested    Verbal Errors Neologisms;Perseveration;Phonemic paraphasias    Effective Techniques Phonemic cues;Written cues;Articulatory cues    Non-Verbal Means of Communication Other (comment)   has "device" but does not use it     Written Expression   Self Formulation Ability Word    Overall Writen Expression Pt wrote name and birthday with additional time. Some errors exhibited for writing favorite color and age in response to verbal prompt.      Oral Motor/Sensory Function   Overall Oral Motor/Sensory Function Impaired    Labial Symmetry Abnormal symmetry right    Labial Coordination Reduced    Lingual ROM Within Functional Limits    Lingual Symmetry Within Functional Limits    Lingual Coordination Reduced    Facial Coordination Reduced      Motor Speech   Overall Motor Speech Impaired    Respiration Within functional limits    Articulation Impaired    Level of Impairment Word    Motor Planning Impaired    Level of Impairment Word    Motor Speech Errors Inconsistent      Standardized Assessments   Standardized Assessments  Other Assessment   Quick Aphasia Battery= 3.51 (severe)                            SLP Education - 03/21/21 1740     Education Details eval results, possible goals    Person(s) Educated Patient;Parent(s)    Methods Explanation;Demonstration;Verbal cues    Comprehension Verbalized understanding;Returned demonstration;Need further instruction              SLP Short Term Goals - 03/21/21 1759       SLP SHORT TERM GOAL #1   Title Pt will repeat single words with accurate  articulation with 50% accuracy given SLP modeling over 2 sessions    Time 4    Period Weeks    Status New    Target Date 04/18/21      SLP SHORT TERM GOAL #2   Title Pt will verbally name personally relevant objects/words/pictures with 50% accuracy given usual mod A over 2 sessions    Time 4    Period Weeks    Status New  Target Date 04/18/21      SLP SHORT TERM GOAL #3   Title Pt will write 1-2 words in response to SLP prompts with 80% accuracy given occasional min A over 2 sesions    Time 4    Period Weeks    Status New    Target Date 04/18/21      SLP SHORT TERM GOAL #4   Title Pt will use mutimodal communication in combination with verbal output as able to communicate wants/needs given occasional min A over 2 sessions    Time 4    Period Weeks    Status New    Target Date 04/18/21      SLP SHORT TERM GOAL #5   Title Pt and family will demonstrate increased comprehension and ability to utilize speech communication device given occasional min A over 3 sessions    Time 4    Period Weeks    Status New    Target Date 04/18/21              SLP Long Term Goals - 03/21/21 1810       SLP LONG TERM GOAL #1   Title Pt will verbalize 2-3 personally relevant words or phrases to aid communication with family given occasional min A over 2 sessions    Time 8    Period Weeks    Status New    Target Date 05/16/21      SLP LONG TERM GOAL #2   Title Patient will read aloud or repeat words, maintaining phonemic accuracy, with 80% accuracy given usual fading to occasional min A over 2 session    Time 8    Period Weeks    Status New    Target Date 05/16/21      SLP LONG TERM GOAL #3   Title Pt will name objects using multimodal strategies with 80% accuracy given occasional mod A over 2 sessions    Time 8    Period Weeks    Status New    Target Date 05/16/21      SLP LONG TERM GOAL #4   Title Pt will augment verbal communication by spontaneously utilizing communication  device for communication of wants/needs for 3/5 opportunities over 2 sessions    Time 8    Period Weeks    Status New    Target Date 05/16/21              Plan - 03/21/21 1743     Clinical Impression Statement "Xavier Villegas" presents to OPST evaluation to address global aphasia from stroke in June 2019. Pt has been receiving PT with a brain injury specialist, with noticable change in communication reported as PT has been able to increase pt's speech output. Per family report, pt is using a white board, can add/subtract, and can verbalize some words. Pt is also completing word searches at home. Pt previously received OPST in 2019; however, pt's father reported limited progress and dissatisfaction. No other ST intervention reported since then. Due to recent onset of improved verbal communication, family requested ST evaluation. Pt does reportedly have a communication device at home, but device is not often utilized due to uncertainty how to modify and implement it. In conversation related to pertinent medical hx, pt usually made eye contact to cue father to answer. Pt intermittently verbalized "I don't know" or "yeah yeah yeah" in response to SLP questions. Pt is effectively using gestures at home to communicate, with family providing example of patient requesting  haircut with extra time and gestures. Quick Aphasia Battery completed this session, which revealed severe expressive language deficits and min/mild receptive language deficits. Pt able to answer biographical infromation with yes/no questions and describe action pictures with use of hand gestures x2 (swing/run). Some rare reduced comprehension exhibited for complex yes/no questions and identification of pictures (violin for drum). For verbal speech tasks, pt noted to usually whisper perseverated unintelligible responses and curse when anomia occurred. SLP trialed semantic and phonemic cues, in which pt was most successful with phonemic cues. Pt able to  complete final sounds of words if SLP initiated beginning sound(s). Inconsistent ability to repeat SLP at word and phrase level demonstrated; however, pt noted to intermittently repeat father given modeling with some articulatory distortions noted. Pt was unable to independently read aloud at word, phrase, or sentence level this session, in which occasional words verbalized given initial phonemic prompts. Writing appeared to be general strength, with pt able to spontaneously write simple responses to SLP basic questions. Some difficulty with spelling noted, which benefited from extended time. During OME, pt exhibited increased diffculty with coordination and executive of single speech sounds with apraxia indicated. Family desires for patient to continue to increase his verbal output. Skilled ST is warranted to address global aphasia to optimize pt's communication effectiveness and increase QOL.   Speech Therapy Frequency 2x / week    Duration 8 weeks    Treatment/Interventions Oral motor exercises;Functional tasks;Patient/family education;Multimodal communcation approach;Language facilitation;Compensatory techniques;SLP instruction and feedback;Internal/external aids    Potential to Achieve Goals Fair    Potential Considerations Severity of impairments    Consulted and Agree with Plan of Care Patient;Family member/caregiver             Patient will benefit from skilled therapeutic intervention in order to improve the following deficits and impairments:   Aphasia  Verbal apraxia    Problem List Patient Active Problem List   Diagnosis Date Noted   CVA (cerebral vascular accident) ([place]) 05/02/2018   Neurogenic bowel    Neurogenic bladder    Spastic hemiparesis affecting dominant side ([place])    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 [place], [place], [address] Phone: [phone]   Fax:  [phone]  Name: Xavier Villegas MRN: [id number] Date of Birth: 1981/04/07

## 2021-03-24 ENCOUNTER — Ambulatory Visit: Payer: Medicare HMO

## 2021-03-24 ENCOUNTER — Other Ambulatory Visit: Payer: Self-pay

## 2021-03-24 DIAGNOSIS — R482 Apraxia: Secondary | ICD-10-CM

## 2021-03-24 DIAGNOSIS — R531 Weakness: Secondary | ICD-10-CM | POA: Diagnosis not present

## 2021-03-24 DIAGNOSIS — R4701 Aphasia: Secondary | ICD-10-CM

## 2021-03-24 DIAGNOSIS — G8191 Hemiplegia, unspecified affecting right dominant side: Secondary | ICD-10-CM | POA: Diagnosis not present

## 2021-03-24 NOTE — Therapy (Signed)
[facility] [address] [place], [place], [address] Phone: [phone]   Fax:  [phone]  Speech Language Pathology Treatment  Patient Details  Name: Xavier Villegas MRN: [id number] Date of Birth: October 28, 1980 Referring Provider (SLP): Meredith Staggers, MD   Encounter Date: 03/24/2021   End of Session - 03/24/21 1614     Visit Number 2    Number of Visits 17    Date for SLP Re-Evaluation 05/16/21    Authorization Type [employer]    SLP Start Time 1615    SLP Stop Time  1700    SLP Time Calculation (min) 45 min    Activity Tolerance Patient tolerated treatment well             Past Medical History:  Diagnosis Date   Aphasia    GERD (gastroesophageal reflux disease)    Hemiparesis ([place]) 02/2018   right side   Hyperhomocysteinemia ([place])    Neurogenic bladder 02/2018   04/26/18 inporving   Spastic hemiparesis ([place])    right side   Stroke ([place])    Left MCA infart   Tobacco abuse     Past Surgical History:  Procedure Laterality Date   CRANIOTOMY Left 02/21/2018   Procedure: DECOMPRESSIVE CRANIECTOMY with bone flap to abdomen;  Surgeon: [name], MD;  Location: [facility];  Service: Neurosurgery;  Laterality: Left;  DECOMPRESSIVE CRANIECTOMY with bone flap to abdomen   CRANIOTOMY N/A 05/02/2018   Procedure: RE-IMPLANTATION OF CRANIAL FLAP;  Surgeon: [name], MD;  Location: [facility];  Service: Neurosurgery;  Laterality: N/A;  RE-IMPLANTATION OF CRANIAL FLAP   IR ANGIO INTRA EXTRACRAN SEL COM CAROTID INNOMINATE UNI R MOD SED  02/19/2018   IR ANGIO VERTEBRAL SEL VERTEBRAL BILAT MOD SED  02/19/2018   IR CT HEAD LTD  02/19/2018   IR PERCUTANEOUS ART THROMBECTOMY/INFUSION INTRACRANIAL INC DIAG ANGIO  02/19/2018   IR US GUIDE VASC ACCESS RIGHT  02/19/2018   RADIOLOGY WITH ANESTHESIA N/A 02/19/2018   Procedure: IR WITH ANESTHESIA CODE STROKE;  Surgeon: Corrie Mckusick, DO;  Location: [facility];  Service:  Anesthesiology;  Laterality: N/A;   TEE WITHOUT CARDIOVERSION N/A 03/01/2018   Procedure: TRANSESOPHAGEAL ECHOCARDIOGRAM (TEE);  Surgeon: Sanda Klein, MD;  Location[facility] ENDOSCOPY;  Service: Cardiovascular;  Laterality: N/A;    There were no vitals filed for this visit.   Subjective Assessment - 03/24/21 1920     Subjective nodded head and smiled    Patient is accompained by: Family member   mother   Currently in Pain? No/denies                   ADULT SLP TREATMENT - 03/24/21 1613       General Information   Behavior/Cognition Alert;Cooperative;Pleasant mood      Treatment Provided   Treatment provided Cognitive-Linquistic      Cognitive-Linquistic Treatment   Treatment focused on Aphasia;Apraxia    Skilled Treatment SLP engaged patient in intial conversation, with occasional "I don't know" verbalized. Some gestures utilized and cueing looks for mother to respond. No other spontaneous, intelligible verbal responses exhibited. Pt brought in [place] device, in which pt reportedly has trouble "finding things" and gets "frustrated" per mother. SLP initiated conversation re: hobbies and modified possible page to reflect personal hobbies. Pt able to ID 3 pictures without difficulty. SLP will continue to modify and train device. In discussion of favorite hobbies, pt relied heavily on gestures and yes/no questions. SLP  provided consistent modeling to elicit verbal output. Usual modeling and max verbal cues fading to usual visual and intermitent moderate verbal cues were effective in producing some accurate verbal productions/approximations at word level. SLP recommended patient familiarize self with [employer]raph[employer] device and verbalize with and then without verbal model. SLP recommended to patient and mother on reducing verbal cues and trialing visual cues to aid apraxia of speech.      Assessment / Recommendations / Plan   Plan Continue with current plan of care       Progression Toward Goals   Progression toward goals Progressing toward goals              SLP Education - 03/24/21 1930     Education Details visual cues versus auditory, using modeling of Lingraphica, continue to bring Lingraphica to therapy    Person(s) Educated Patient;Parent(s)    Methods Explanation;Demonstration;Verbal cues    Comprehension Verbalized understanding;Returned demonstration;Verbal cues required;Need further instruction              SLP Short Term Goals - 03/24/21 1614       SLP SHORT TERM GOAL #1   Title Pt will repeat single words with accurate articulation with 50% accuracy given SLP modeling over 2 sessions    Time 4    Period Weeks    Status On-going    Target Date 04/18/21      SLP SHORT TERM GOAL #2   Title Pt will verbally name personally relevant objects/words/pictures with 50% accuracy given usual mod A over 2 sessions    Time 4    Period Weeks    Status On-going    Target Date 04/18/21      SLP SHORT TERM GOAL #3   Title Pt will write 1-2 words in response to SLP prompts with 80% accuracy given occasional min A over 2 sesions    Time 4    Period Weeks    Status On-going    Target Date 04/18/21      SLP SHORT TERM GOAL #4   Title Pt will use mutimodal communication in combination with verbal output as able to communicate wants/needs given occasional min A over 2 sessions    Time 4    Period Weeks    Status On-going    Target Date 04/18/21      SLP SHORT TERM GOAL #5   Title Pt and family will demonstrate increased comprehension and ability to utilize speech communication device given occasional min A over 3 sessions    Time 4    Period Weeks    Status On-going    Target Date 04/18/21              SLP Long Term Goals - 03/24/21 1614       SLP LONG TERM GOAL #1   Title Pt will verbalize 2-3 personally relevant words or phrases to aid communication with family given occasional min A over 2 sessions    Time 8    Period  Weeks    Status On-going    Target Date 05/16/21      SLP LONG TERM GOAL #2   Title Patient will read aloud or repeat words, maintaining phonemic accuracy, with 80% accuracy given usual fading to occasional min A over 2 session    Time 8    Period Weeks    Status On-going    Target Date 05/16/21      SLP LONG TERM GOAL #3   Title  Pt will name objects using multimodal strategies with 80% accuracy given occasional mod A over 2 sessions    Time 8    Period Weeks    Status On-going    Target Date 05/16/21      SLP LONG TERM GOAL #4   Title Pt will augment verbal communication by spontaneously utilizing communication device for communication of wants/needs for 3/5 opportunities over 2 sessions    Time 8    Period Weeks    Status On-going    Target Date 05/16/21              Plan - 03/24/21 1931     Clinical Impression Statement "Xavier Villegas" presents to OPST intervention to address global aphasia and apraxia of speech from stroke in June 2019. Pt brought in personal [employer] Lingraph[employer] device. SLP will continue to modify and train device to optimize functionality and usage of device as communication support. SLP targeted generation of functional words/phrases related to personal hobbies. Usual max verbal cues fading to usual mod visual and occasional mod verbal cues required to elicit production/approximations at word level. Family desires for patient to continue to increase his verbal output. Skilled ST is warranted to address global aphasia and apraxia to optimize pt's communication effectiveness.    Speech Therapy Frequency 2x / week    Duration 8 weeks    Treatment/Interventions Oral motor exercises;Functional tasks;Patient/family education;Multimodal communcation approach;Language facilitation;Compensatory techniques;SLP instruction and feedback;Internal/external aids    Potential to Achieve Goals Fair    Potential Considerations Severity of impairments;Other (comment)   time since  onset   SLP Home Exercise Plan provided    Consulted and Agree with Plan of Care Patient;Family member/caregiver    Family Member Consulted mother             Patient will benefit from skilled therapeutic intervention in order to improve the following deficits and impairments:   Aphasia  Verbal apraxia    Problem List Patient Active Problem List   Diagnosis Date Noted   CVA (cerebral vascular accident) ([place]) 05/02/2018   Neurogenic bowel    Neurogenic bladder    Spastic hemiparesis affecting dominant side ([place])    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 [place], [place], [address] Phone: [phone]   Fax:  [phone]   Name: Xavier Villegas MRN: [id number] Date of Birth: [birth year]

## 2021-03-25 ENCOUNTER — Ambulatory Visit: Payer: Medicare HMO

## 2021-03-25 DIAGNOSIS — R4701 Aphasia: Secondary | ICD-10-CM

## 2021-03-25 DIAGNOSIS — R482 Apraxia: Secondary | ICD-10-CM | POA: Diagnosis not present

## 2021-03-25 NOTE — Therapy (Signed)
[facility] [address] [place], [place], [address] Phone: [phone]   Fax:  [phone]  Speech Language Pathology Treatment  Patient Details  Name: Xavier Villegas MRN: [id number] Date of Birth: 04-23-1981 Referring Provider (SLP): Meredith Staggers, MD   Encounter Date: 03/25/2021   End of Session - 03/25/21 1743     Visit Number 3    Number of Visits 17    Date for SLP Re-Evaluation 05/16/21    Authorization Type [employer]    SLP Start Time 1745    SLP Stop Time  [id number]    SLP Time Calculation (min) 45 min    Activity Tolerance Patient tolerated treatment well             Past Medical History:  Diagnosis Date   Aphasia    GERD (gastroesophageal reflux disease)    Hemiparesis ([place]) 02/2018   right side   Hyperhomocysteinemia ([place])    Neurogenic bladder 02/2018   04/26/18 inporving   Spastic hemiparesis ([place])    right side   Stroke ([place])    Left MCA infart   Tobacco abuse     Past Surgical History:  Procedure Laterality Date   CRANIOTOMY Left 02/21/2018   Procedure: DECOMPRESSIVE CRANIECTOMY with bone flap to abdomen;  Surgeon: [name], MD;  Location: [facility];  Service: Neurosurgery;  Laterality: Left;  DECOMPRESSIVE CRANIECTOMY with bone flap to abdomen   CRANIOTOMY N/A 05/02/2018   Procedure: RE-IMPLANTATION OF CRANIAL FLAP;  Surgeon: [name], MD;  Location: [facility];  Service: Neurosurgery;  Laterality: N/A;  RE-IMPLANTATION OF CRANIAL FLAP   IR ANGIO INTRA EXTRACRAN SEL COM CAROTID INNOMINATE UNI R MOD SED  02/19/2018   IR ANGIO VERTEBRAL SEL VERTEBRAL BILAT MOD SED  02/19/2018   IR CT HEAD LTD  02/19/2018   IR PERCUTANEOUS ART THROMBECTOMY/INFUSION INTRACRANIAL INC DIAG ANGIO  02/19/2018   IR US GUIDE VASC ACCESS RIGHT  02/19/2018   RADIOLOGY WITH ANESTHESIA N/A 02/19/2018   Procedure: IR WITH ANESTHESIA CODE STROKE;  Surgeon: Corrie Mckusick, DO;  Location: [facility];  Service:  Anesthesiology;  Laterality: N/A;   TEE WITHOUT CARDIOVERSION N/A 03/01/2018   Procedure: TRANSESOPHAGEAL ECHOCARDIOGRAM (TEE);  Surgeon: Sanda Klein, MD;  [facility];  Service: Cardiovascular;  Laterality: N/A;    There were no vitals filed for this visit.   Subjective Assessment - 03/25/21 1744     Subjective nodded head and smiled    Patient is accompained by: Family member   mother, Izora Gala   Currently in Pain? No/denies                   ADULT SLP TREATMENT - 03/25/21 1743       General Information   Behavior/Cognition Alert;Cooperative;Pleasant mood      Treatment Provided   Treatment provided Cognitive-Linquistic      Cognitive-Linquistic Treatment   Treatment focused on Aphasia;Apraxia    Skilled Treatment Pt's mother reported pt completed modeling via Lingraphica as recommended and practiced targeted words from last session using white board. SLP reviewed using visual cues versus verbal cues to wean pt's reliance on verbal intiation, in which both verbalized understanding. SLP continued ongoing education and training of Lingraphica device to optimize communication system. SLP modified information page with demonstration and handout provided to patient and family on how to personalize device. SLP targeted verbal output following Lingraphica model and SLP model. SLP predominantly used visual cues to aid articulation, which was  successful. Pt required usual visual cues to verbalize every word in short phrase. Occasional min to mod verbal cues required to reduce perseverations and apraxic errors.      Assessment / Recommendations / Plan   Plan Continue with current plan of care      Progression Toward Goals   Progression toward goals Progressing toward goals              SLP Education - 03/25/21 2056     Education Details how to modify Lingraphica, use visual cues versus verbal if able    Person(s) Educated Patient;Parent(s)    Methods  Explanation;Demonstration;Handout;Verbal cues    Comprehension Verbalized understanding;Returned demonstration;Need further instruction              SLP Short Term Goals - 03/25/21 1743       SLP SHORT TERM GOAL #1   Title Pt will repeat single words with accurate articulation with 50% accuracy given SLP modeling over 2 sessions    Time 4    Period Weeks    Status On-going    Target Date 04/18/21      SLP SHORT TERM GOAL #2   Title Pt will verbally name personally relevant objects/words/pictures with 50% accuracy given usual mod A over 2 sessions    Time 4    Period Weeks    Status On-going    Target Date 04/18/21      SLP SHORT TERM GOAL #3   Title Pt will write 1-2 words in response to SLP prompts with 80% accuracy given occasional min A over 2 sesions    Time 4    Period Weeks    Status On-going    Target Date 04/18/21      SLP SHORT TERM GOAL #4   Title Pt will use mutimodal communication in combination with verbal output as able to communicate wants/needs given occasional min A over 2 sessions    Time 4    Period Weeks    Status On-going    Target Date 04/18/21      SLP SHORT TERM GOAL #5   Title Pt and family will demonstrate increased comprehension and ability to utilize speech communication device given occasional min A over 3 sessions    Time 4    Period Weeks    Status On-going    Target Date 04/18/21              SLP Long Term Goals - 03/25/21 1743       SLP LONG TERM GOAL #1   Title Pt will verbalize 2-3 personally relevant words or phrases to aid communication with family given occasional min A over 2 sessions    Time 8    Period Weeks    Status On-going    Target Date 05/16/21      SLP LONG TERM GOAL #2   Title Patient will read aloud or repeat words, maintaining phonemic accuracy, with 80% accuracy given usual fading to occasional min A over 2 session    Time 8    Period Weeks    Status On-going    Target Date 05/16/21      SLP LONG  TERM GOAL #3   Title Pt will name objects using multimodal strategies with 80% accuracy given occasional mod A over 2 sessions    Time 8    Period Weeks    Status On-going    Target Date 05/16/21      SLP LONG TERM GOAL #  4   Title Pt will augment verbal communication by spontaneously utilizing communication device for communication of wants/needs for 3/5 opportunities over 2 sessions    Time 8    Period Weeks    Status On-going    Target Date 05/16/21              Plan - 03/25/21 2057     Clinical Impression Statement "[name]" presents to OPST intervention to address global aphasia and apraxia of speech from stroke in June 2019. Pt brought in personal Mini Talk Lingraphica device. SLP continued to modify and train device to optimize functionality and usage of device as communication support. SLP targeted generation of functional words/phrases related to family and friends. Usual min to rare mod visual cues and occasional mod verbal cues required to elicit production/approximations at word level and phrase level. Family desires for patient to continue to increase his verbal output. Skilled ST is warranted to address global aphasia and apraxia to optimize pt's communication effectiveness.    Speech Therapy Frequency 2x / week    Duration 8 weeks    Treatment/Interventions Oral motor exercises;Functional tasks;Patient/family education;Multimodal communcation approach;Language facilitation;Compensatory techniques;SLP instruction and feedback;Internal/external aids    Potential to Achieve Goals Fair    Potential Considerations Severity of impairments;Other (comment)   time since onset   SLP Home Exercise Plan provided    Consulted and Agree with Plan of Care Patient;Family member/caregiver    Family Member Consulted mother             Patient will benefit from skilled therapeutic intervention in order to improve the following deficits and impairments:   Aphasia  Verbal  apraxia    Problem List Patient Active Problem List   Diagnosis Date Noted   CVA (cerebral vascular accident) ([place]) 05/02/2018   Neurogenic bowel    Neurogenic bladder    Spastic hemiparesis affecting dominant side ([place])    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 [place], [place], [address] Phone: [phone]   Fax:  [phone]   Name: Mossimo Szatkowski MRN: [id number] Date of Birth: 05-02-1981

## 2021-03-25 NOTE — Patient Instructions (Signed)
How to modify a page:  1) Go to the page you want to add an icon 2) Press orange button in upper right hand corner  3) Press "add new icon/page" 4) Choose an icon for the image 5) Edit Text to name it    Set up internet via advanced settings and select network/internet

## 2021-03-26 DIAGNOSIS — G8191 Hemiplegia, unspecified affecting right dominant side: Secondary | ICD-10-CM | POA: Diagnosis not present

## 2021-03-26 DIAGNOSIS — R531 Weakness: Secondary | ICD-10-CM | POA: Diagnosis not present

## 2021-03-28 DIAGNOSIS — R531 Weakness: Secondary | ICD-10-CM | POA: Diagnosis not present

## 2021-03-28 DIAGNOSIS — G8191 Hemiplegia, unspecified affecting right dominant side: Secondary | ICD-10-CM | POA: Diagnosis not present

## 2021-03-31 DIAGNOSIS — G8191 Hemiplegia, unspecified affecting right dominant side: Secondary | ICD-10-CM | POA: Diagnosis not present

## 2021-03-31 DIAGNOSIS — R531 Weakness: Secondary | ICD-10-CM | POA: Diagnosis not present

## 2021-04-02 ENCOUNTER — Other Ambulatory Visit: Payer: Self-pay

## 2021-04-02 ENCOUNTER — Ambulatory Visit: Payer: Medicare HMO

## 2021-04-02 DIAGNOSIS — G8191 Hemiplegia, unspecified affecting right dominant side: Secondary | ICD-10-CM | POA: Diagnosis not present

## 2021-04-02 DIAGNOSIS — R482 Apraxia: Secondary | ICD-10-CM | POA: Diagnosis not present

## 2021-04-02 DIAGNOSIS — R531 Weakness: Secondary | ICD-10-CM | POA: Diagnosis not present

## 2021-04-02 DIAGNOSIS — R4701 Aphasia: Secondary | ICD-10-CM

## 2021-04-03 NOTE — Therapy (Signed)
[facility] [address] [place], [place], [address] Phone: [phone]   Fax:  [phone]  Speech Language Pathology Treatment  Patient Details  Name: Xavier Villegas MRN: [id number] Date of Birth: 11/19/1980 Referring Provider (SLP): Meredith Staggers, MD   Encounter Date: 04/02/2021   End of Session - 04/02/21 1658     Visit Number 4    Number of Visits 17    Date for SLP Re-Evaluation 05/16/21    Authorization Type [employer]    SLP Start Time 1700    SLP Stop Time  [id number]    SLP Time Calculation (min) 45 min    Activity Tolerance Patient tolerated treatment well             Past Medical History:  Diagnosis Date   Aphasia    GERD (gastroesophageal reflux disease)    Hemiparesis ([place]) 02/2018   right side   Hyperhomocysteinemia ([place])    Neurogenic bladder 02/2018   04/26/18 inporving   Spastic hemiparesis ([place])    right side   Stroke ([place])    Left MCA infart   Tobacco abuse     Past Surgical History:  Procedure Laterality Date   CRANIOTOMY Left 02/21/2018   Procedure: DECOMPRESSIVE CRANIECTOMY with bone flap to abdomen;  Surgeon: [name], MD;  Location: [facility];  Service: Neurosurgery;  Laterality: Left;  DECOMPRESSIVE CRANIECTOMY with bone flap to abdomen   CRANIOTOMY N/A 05/02/2018   Procedure: RE-IMPLANTATION OF CRANIAL FLAP;  Surgeon: [name], MD;  Location: [facility];  Service: Neurosurgery;  Laterality: N/A;  RE-IMPLANTATION OF CRANIAL FLAP   IR ANGIO INTRA EXTRACRAN SEL COM CAROTID INNOMINATE UNI R MOD SED  02/19/2018   IR ANGIO VERTEBRAL SEL VERTEBRAL BILAT MOD SED  02/19/2018   IR CT HEAD LTD  02/19/2018   IR PERCUTANEOUS ART THROMBECTOMY/INFUSION INTRACRANIAL INC DIAG ANGIO  02/19/2018   IR US GUIDE VASC ACCESS RIGHT  02/19/2018   RADIOLOGY WITH ANESTHESIA N/A 02/19/2018   Procedure: IR WITH ANESTHESIA CODE STROKE;  Surgeon: Corrie Mckusick, DO;  Location: [facility];  Service:  Anesthesiology;  Laterality: N/A;   TEE WITHOUT CARDIOVERSION N/A 03/01/2018   Procedure: TRANSESOPHAGEAL ECHOCARDIOGRAM (TEE);  Surgeon: Sanda Klein, MD;  Location[facility];  Service: Cardiovascular;  Laterality: N/A;    There were no vitals filed for this visit.   Subjective Assessment - 04/02/21 1700     Subjective "Hi"    Patient is accompained by: Family member   mother, Izora Gala   Currently in Pain? No/denies                   ADULT SLP TREATMENT - 04/02/21 1658       General Information   Behavior/Cognition Alert;Cooperative;Pleasant mood      Treatment Provided   Treatment provided Cognitive-Linquistic      Cognitive-Linquistic Treatment   Treatment focused on Aphasia;Apraxia    Skilled Treatment Inconsistent Lingraphica trials at home indicated. Pt's mother reports using whiteboard to practice specific words. Some modifications in [place] noted, with new photos present per SLP recommendation. SLP targeted functional word and phrases with usual min to mod verbal and visual cues needed for articulation at word and phrase level this session due to inconsistent articulation and production. SLP educated patient and mother on presence of apraxia impacting oral motor coordination and consistency of sound productions. SLP provided /k, g/ sounds as HEP due to difficulty with production. Pt able to produce sounds in  final position. SLP will continue to target verbal expression, but SLP recommended using Lingraphica device daily to optimize usage and familiarization.      Assessment / Recommendations / Plan   Plan Continue with current plan of care      Progression Toward Goals   Progression toward goals Progressing toward goals              SLP Education - 04/03/21 1008     Education Details Lingraphica trials, HEP    Person(s) Educated Patient;Parent(s)    Methods Explanation;Demonstration;Handout;Verbal cues    Comprehension Verbalized  understanding;Returned demonstration;Need further instruction;Verbal cues required              SLP Short Term Goals - 04/02/21 1659       SLP SHORT TERM GOAL #1   Title Pt will repeat single words with accurate articulation with 50% accuracy given SLP modeling over 2 sessions    Baseline 04-02-21    Time 4    Period Weeks    Status On-going    Target Date 04/18/21      SLP SHORT TERM GOAL #2   Title Pt will verbally name personally relevant objects/words/pictures with 50% accuracy given usual mod A over 2 sessions    Baseline 04-02-21    Time 4    Period Weeks    Status On-going    Target Date 04/18/21      SLP SHORT TERM GOAL #3   Title Pt will write 1-2 words in response to SLP prompts with 80% accuracy given occasional min A over 2 sesions    Time 4    Period Weeks    Status On-going    Target Date 04/18/21      SLP SHORT TERM GOAL #4   Title Pt will use mutimodal communication in combination with verbal output as able to communicate wants/needs given occasional min A over 2 sessions    Time 4    Period Weeks    Status On-going    Target Date 04/18/21      SLP SHORT TERM GOAL #5   Title Pt and family will demonstrate increased comprehension and ability to utilize speech communication device given occasional min A over 3 sessions    Baseline 04-02-21    Time 4    Period Weeks    Status On-going    Target Date 04/18/21              SLP Long Term Goals - 04/02/21 1659       SLP LONG TERM GOAL #1   Title Pt will verbalize 2-3 personally relevant words or phrases to aid communication with family given occasional min A over 2 sessions    Time 8    Period Weeks    Status On-going    Target Date 05/16/21      SLP LONG TERM GOAL #2   Title Patient will read aloud or repeat words, maintaining phonemic accuracy, with 80% accuracy given usual fading to occasional min A over 2 session    Time 8    Period Weeks    Status On-going    Target Date 05/16/21       SLP LONG TERM GOAL #3   Title Pt will name objects using multimodal strategies with 80% accuracy given occasional mod A over 2 sessions    Time 8    Period Weeks    Status On-going    Target Date 05/16/21  SLP LONG TERM GOAL #4   Title Pt will augment verbal communication by spontaneously utilizing communication device for communication of wants/needs for 3/5 opportunities over 2 sessions    Time 8    Period Weeks    Status On-going    Target Date 05/16/21              Plan - 04/02/21 1658     Clinical Impression Statement "Xavier Villegas" presents to OPST intervention to address global aphasia and apraxia of speech from stroke in June 2019. Pt brought in personal Mini Talk Lingraphica device. SLP continued to emphasis use of device to optimize functionality and usage of device as communication support. SLP targeted generation of functional words/phrases related to family and friends. Usual min to mod verbal and visual cues required to elicit production/approximations at word level and phrase level this session. Family desires for patient to continue to increase his verbal output. Skilled ST is warranted to address global aphasia and apraxia to optimize pt's communication effectiveness.    Speech Therapy Frequency 2x / week    Duration 8 weeks    Treatment/Interventions Oral motor exercises;Functional tasks;Patient/family education;Multimodal communcation approach;Language facilitation;Compensatory techniques;SLP instruction and feedback;Internal/external aids    Potential to Achieve Goals Fair    Potential Considerations Severity of impairments;Other (comment)   time since onset   SLP Home Exercise Plan provided    Consulted and Agree with Plan of Care Patient;Family member/caregiver             Patient will benefit from skilled therapeutic intervention in order to improve the following deficits and impairments:   Aphasia  Verbal apraxia    Problem List Patient Active Problem  List   Diagnosis Date Noted   CVA (cerebral vascular accident) ([place]) 05/02/2018   Neurogenic bowel    Neurogenic bladder    Spastic hemiparesis affecting dominant side ([place])    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 [place], [place], [address] Phone: [phone]   Fax:  [phone]   Name: Xavier Villegas MRN: [id number] Date of Birth: 13-Mar-1981

## 2021-04-04 DIAGNOSIS — G8191 Hemiplegia, unspecified affecting right dominant side: Secondary | ICD-10-CM | POA: Diagnosis not present

## 2021-04-04 DIAGNOSIS — R531 Weakness: Secondary | ICD-10-CM | POA: Diagnosis not present

## 2021-04-07 DIAGNOSIS — R531 Weakness: Secondary | ICD-10-CM | POA: Diagnosis not present

## 2021-04-07 DIAGNOSIS — G8191 Hemiplegia, unspecified affecting right dominant side: Secondary | ICD-10-CM | POA: Diagnosis not present

## 2021-04-09 ENCOUNTER — Encounter: Payer: Self-pay | Admitting: Physical Medicine & Rehabilitation

## 2021-04-09 ENCOUNTER — Other Ambulatory Visit: Payer: Self-pay

## 2021-04-09 ENCOUNTER — Encounter: Payer: Medicare HMO | Attending: Physical Medicine & Rehabilitation | Admitting: Physical Medicine & Rehabilitation

## 2021-04-09 VITALS — BP 142/89 | HR 70 | Ht 69.0 in | Wt 273.6 lb

## 2021-04-09 DIAGNOSIS — R531 Weakness: Secondary | ICD-10-CM | POA: Diagnosis not present

## 2021-04-09 DIAGNOSIS — G8110 Spastic hemiplegia affecting unspecified side: Secondary | ICD-10-CM | POA: Diagnosis not present

## 2021-04-09 DIAGNOSIS — G8191 Hemiplegia, unspecified affecting right dominant side: Secondary | ICD-10-CM | POA: Diagnosis not present

## 2021-04-09 DIAGNOSIS — G8111 Spastic hemiplegia affecting right dominant side: Secondary | ICD-10-CM

## 2021-04-09 NOTE — Patient Instructions (Signed)
.  calme

## 2021-04-09 NOTE — Progress Notes (Signed)
Dysport Injection for spasticity using needle EMG guidance Indication:  Spastic hemiparesis affecting dominant side (HCC) G81.10  Dilution: 500 Units/50m        Total Units Injected:  1000 Indication: Severe spasticity which interferes with ADL,mobility and/or  hygiene and is unresponsive to medication management and other conservative care Informed consent was obtained after describing risks and benefits of the procedure with the patient. This includes bleeding, bruising, infection, excessive weakness, or medication side effects. A REMS form is on file and signed.  right Needle: 570minjectable monopolar needle electrode  Number of units per muscle Pectoralis Major 150 units Pectoralis Minor 350 units Biceps 0 units Brachioradialis 0 units FCR 50 units FCU 50 units FDS 150 units FDP 150 units FPL 0 units Pronator Teres 100 units Pronator Quadratus 0 units Lumbricals 0 units  All injections were done after obtaining appropriate EMG activity and after negative drawback for blood. The patient tolerated the procedure well. Post procedure instructions were given.

## 2021-04-11 DIAGNOSIS — G8191 Hemiplegia, unspecified affecting right dominant side: Secondary | ICD-10-CM | POA: Diagnosis not present

## 2021-04-11 DIAGNOSIS — R531 Weakness: Secondary | ICD-10-CM | POA: Diagnosis not present

## 2021-04-16 DIAGNOSIS — G8191 Hemiplegia, unspecified affecting right dominant side: Secondary | ICD-10-CM | POA: Diagnosis not present

## 2021-04-16 DIAGNOSIS — R531 Weakness: Secondary | ICD-10-CM | POA: Diagnosis not present

## 2021-04-17 DIAGNOSIS — G8191 Hemiplegia, unspecified affecting right dominant side: Secondary | ICD-10-CM | POA: Diagnosis not present

## 2021-04-17 DIAGNOSIS — R531 Weakness: Secondary | ICD-10-CM | POA: Diagnosis not present

## 2021-04-21 DIAGNOSIS — R531 Weakness: Secondary | ICD-10-CM | POA: Diagnosis not present

## 2021-04-21 DIAGNOSIS — G8191 Hemiplegia, unspecified affecting right dominant side: Secondary | ICD-10-CM | POA: Diagnosis not present

## 2021-04-23 ENCOUNTER — Other Ambulatory Visit: Payer: Self-pay

## 2021-04-23 ENCOUNTER — Ambulatory Visit: Payer: Medicare HMO | Attending: Physical Medicine & Rehabilitation

## 2021-04-23 DIAGNOSIS — G8191 Hemiplegia, unspecified affecting right dominant side: Secondary | ICD-10-CM | POA: Diagnosis not present

## 2021-04-23 DIAGNOSIS — R482 Apraxia: Secondary | ICD-10-CM | POA: Diagnosis not present

## 2021-04-23 DIAGNOSIS — R4701 Aphasia: Secondary | ICD-10-CM | POA: Diagnosis not present

## 2021-04-23 DIAGNOSIS — R531 Weakness: Secondary | ICD-10-CM | POA: Diagnosis not present

## 2021-04-23 NOTE — Therapy (Signed)
[facility] [address] [place], [place], [address] Phone: [phone]   Fax:  [phone]  Speech Language Pathology Treatment  Patient Details  Name: Xavier Villegas MRN: [id number] Date of Birth: September 21, 1980 Referring Provider (SLP): Meredith Staggers, MD   Encounter Date: 04/23/2021   End of Session - 04/23/21 1015     Visit Number 5    Number of Visits 17    Date for SLP Re-Evaluation 05/16/21    Authorization Type [employer]    Authorization - Number of Visits 12    SLP Start Time 1015    SLP Stop Time  1100    SLP Time Calculation (min) 45 min    Activity Tolerance Patient tolerated treatment well             Past Medical History:  Diagnosis Date   Aphasia    GERD (gastroesophageal reflux disease)    Hemiparesis ([place]) 02/2018   right side   Hyperhomocysteinemia ([place])    Neurogenic bladder 02/2018   04/26/18 inporving   Spastic hemiparesis ([place])    right side   Stroke ([place])    Left MCA infart   Tobacco abuse     Past Surgical History:  Procedure Laterality Date   CRANIOTOMY Left 02/21/2018   Procedure: DECOMPRESSIVE CRANIECTOMY with bone flap to abdomen;  Surgeon: [name], MD;  Location: [facility];  Service: Neurosurgery;  Laterality: Left;  DECOMPRESSIVE CRANIECTOMY with bone flap to abdomen   CRANIOTOMY N/A 05/02/2018   Procedure: RE-IMPLANTATION OF CRANIAL FLAP;  Surgeon: [name], MD;  Location: [facility];  Service: Neurosurgery;  Laterality: N/A;  RE-IMPLANTATION OF CRANIAL FLAP   IR ANGIO INTRA EXTRACRAN SEL COM CAROTID INNOMINATE UNI R MOD SED  02/19/2018   IR ANGIO VERTEBRAL SEL VERTEBRAL BILAT MOD SED  02/19/2018   IR CT HEAD LTD  02/19/2018   IR PERCUTANEOUS ART THROMBECTOMY/INFUSION INTRACRANIAL INC DIAG ANGIO  02/19/2018   IR US GUIDE VASC ACCESS RIGHT  02/19/2018   RADIOLOGY WITH ANESTHESIA N/A 02/19/2018   Procedure: IR WITH ANESTHESIA CODE STROKE;  Surgeon: Corrie Mckusick, DO;   Location: [facility];  Service: Anesthesiology;  Laterality: N/A;   TEE WITHOUT CARDIOVERSION N/A 03/01/2018   Procedure: TRANSESOPHAGEAL ECHOCARDIOGRAM (TEE);  Surgeon: Sanda Klein, MD;  Location[facility];  Service: Cardiovascular;  Laterality: N/A;    There were no vitals filed for this visit.   Subjective Assessment - 04/23/21 1015     Subjective nodded head    Patient is accompained by: Family member   mother   Currently in Pain? No/denies                   ADULT SLP TREATMENT - 04/23/21 1015       General Information   Behavior/Cognition Alert;Cooperative;Pleasant mood      Treatment Provided   Treatment provided Cognitive-Linquistic      Cognitive-Linquistic Treatment   Treatment focused on Aphasia;Apraxia    Skilled Treatment Pt returned to ST after a few week gap. Pt and mother report inconsistent use of Lingraphica (not brought to session today). Family focused on verbal production of names. SLP targeted naming and articulation of immediate family members and relatives seen at recent family reunion. Usual mod verbal and visual cues needed to verbalize names. Inconsistency exhibited in repeated production secondary to apraxia. SLP trialed written conversational starters, in which usual SLP modeling and repetition required due to difficulty verbalizing written words/phrases. SLP provided examples  of functional verbal expression tasks to target at home, including greetings, closings, and family members names in person.      Assessment / Recommendations / Plan   Plan Continue with current plan of care      Progression Toward Goals   Progression toward goals Progressing toward goals              SLP Education - 04/23/21 1617     Education Details functional practice, use of repetition, HEP    Person(s) Educated Patient;Parent(s)    Methods Explanation;Demonstration;Handout;Verbal cues    Comprehension Verbalized understanding;Returned demonstration;Verbal  cues required              SLP Short Term Goals - 04/23/21 1619       SLP SHORT TERM GOAL #1   Title Pt will repeat single words with accurate articulation with 50% accuracy given SLP modeling over 2 sessions    Baseline 04-02-21, 04-23-21    Status Achieved    Target Date 04/18/21      SLP SHORT TERM GOAL #2   Title Pt will verbally name personally relevant objects/words/pictures with 50% accuracy given usual mod A over 2 sessions    Baseline 04-02-21    Time 4    Period Weeks    Status On-going    Target Date 04/18/21      SLP SHORT TERM GOAL #3   Title Pt will write 1-2 words in response to SLP prompts with 80% accuracy given occasional min A over 2 sesions    Time 4    Period Weeks    Status On-going    Target Date 04/18/21      SLP SHORT TERM GOAL #4   Title Pt will use mutimodal communication in combination with verbal output as able to communicate wants/needs given occasional min A over 2 sessions    Time 4    Period Weeks    Status On-going    Target Date 04/18/21      SLP SHORT TERM GOAL #5   Title Pt and family will demonstrate increased comprehension and ability to utilize speech communication device given occasional min A over 3 sessions    Baseline 04-02-21    Time 4    Period Weeks    Status On-going    Target Date 04/18/21              SLP Long Term Goals - 04/23/21 1621       SLP LONG TERM GOAL #1   Title Pt will verbalize 2-3 personally relevant words or phrases to aid communication with family given occasional min A over 2 sessions    Time 8    Period Weeks    Status On-going    Target Date 05/16/21      SLP LONG TERM GOAL #2   Title Patient will read aloud or repeat words, maintaining phonemic accuracy, with 80% accuracy given usual fading to occasional min A over 2 session    Time 8    Period Weeks    Status On-going    Target Date 05/16/21      SLP LONG TERM GOAL #3   Title Pt will name objects using multimodal strategies with  80% accuracy given occasional mod A over 2 sessions    Time 8    Period Weeks    Status On-going    Target Date 05/16/21      SLP LONG TERM GOAL #4   Title Pt will augment  verbal communication by spontaneously utilizing communication device for communication of wants/needs for 3/5 opportunities over 2 sessions    Time 8    Period Weeks    Status On-going    Target Date 05/16/21              Plan - 04/23/21 1617     Clinical Impression Statement "Xavier Villegas" presents to OPST intervention to address global aphasia and apraxia of speech from stroke in June 2019. Pt did not bring personal Mini Talk Lingraphica device this session. SLP targeted verbal expression of family member names and conversational phrases. Usual mod verbal and visual cues required to elicit production/approximations at word level and phrase level this session. Family desires for patient to continue to increase his verbal output. Skilled ST is warranted to address global aphasia and apraxia to optimize pt's communication effectiveness.    Speech Therapy Frequency 2x / week    Duration 8 weeks    Treatment/Interventions Oral motor exercises;Functional tasks;Patient/family education;Multimodal communcation approach;Language facilitation;Compensatory techniques;SLP instruction and feedback;Internal/external aids    Potential to Achieve Goals Fair    Potential Considerations Severity of impairments;Other (comment)   time since onset   SLP Home Exercise Plan provided    Consulted and Agree with Plan of Care Patient;Family member/caregiver    Family Member Consulted mother             Patient will benefit from skilled therapeutic intervention in order to improve the following deficits and impairments:   Aphasia  Verbal apraxia    Problem List Patient Active Problem List   Diagnosis Date Noted   CVA (cerebral vascular accident) ([place]) 05/02/2018   Neurogenic bowel    Neurogenic bladder    Spastic hemiparesis  affecting dominant side ([place])    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[place])    Cerebral infarction due to embolism of left middle cerebral artery ([place]) s/p thrombectomy 03/01/2018   Carotid artery dissection ([place]) Left 03/01/2018   Acute ischemic right MCA stroke ([place]) 03/01/2018   Right hemiparesis ([place])    Dysphagia, post-stroke    Global aphasia    Leukocytosis    Folic acid deficiency [id number]   B12 deficiency 02/23/2018   Hyperhomocysteinemia ([place]) 02/23/2018   Smoker     Alinda Deem, MA CCC-SLP 04/23/2021, 4:22 PM  [facility] [address] [place], [place], [address] Phone: [phone]   Fax:  [phone]   Name: Xavier Villegas MRN: [id number] Date of Birth: 1981-01-19

## 2021-04-25 ENCOUNTER — Ambulatory Visit: Payer: Medicare HMO

## 2021-04-25 DIAGNOSIS — G8191 Hemiplegia, unspecified affecting right dominant side: Secondary | ICD-10-CM | POA: Diagnosis not present

## 2021-04-25 DIAGNOSIS — R531 Weakness: Secondary | ICD-10-CM | POA: Diagnosis not present

## 2021-04-28 ENCOUNTER — Ambulatory Visit: Payer: Medicare HMO

## 2021-04-28 DIAGNOSIS — G8191 Hemiplegia, unspecified affecting right dominant side: Secondary | ICD-10-CM | POA: Diagnosis not present

## 2021-04-28 DIAGNOSIS — R531 Weakness: Secondary | ICD-10-CM | POA: Diagnosis not present

## 2021-04-30 ENCOUNTER — Ambulatory Visit: Payer: Medicare HMO

## 2021-04-30 DIAGNOSIS — R531 Weakness: Secondary | ICD-10-CM | POA: Diagnosis not present

## 2021-04-30 DIAGNOSIS — G8191 Hemiplegia, unspecified affecting right dominant side: Secondary | ICD-10-CM | POA: Diagnosis not present

## 2021-05-02 DIAGNOSIS — R531 Weakness: Secondary | ICD-10-CM | POA: Diagnosis not present

## 2021-05-02 DIAGNOSIS — G8191 Hemiplegia, unspecified affecting right dominant side: Secondary | ICD-10-CM | POA: Diagnosis not present

## 2021-05-04 ENCOUNTER — Other Ambulatory Visit: Payer: Self-pay | Admitting: Physical Medicine & Rehabilitation

## 2021-05-04 DIAGNOSIS — I63412 Cerebral infarction due to embolism of left middle cerebral artery: Secondary | ICD-10-CM

## 2021-05-04 DIAGNOSIS — E539 Vitamin B deficiency, unspecified: Secondary | ICD-10-CM

## 2021-05-06 DIAGNOSIS — G8191 Hemiplegia, unspecified affecting right dominant side: Secondary | ICD-10-CM | POA: Diagnosis not present

## 2021-05-06 DIAGNOSIS — R531 Weakness: Secondary | ICD-10-CM | POA: Diagnosis not present

## 2021-05-09 DIAGNOSIS — G8191 Hemiplegia, unspecified affecting right dominant side: Secondary | ICD-10-CM | POA: Diagnosis not present

## 2021-05-09 DIAGNOSIS — R531 Weakness: Secondary | ICD-10-CM | POA: Diagnosis not present

## 2021-05-14 DIAGNOSIS — G8191 Hemiplegia, unspecified affecting right dominant side: Secondary | ICD-10-CM | POA: Diagnosis not present

## 2021-05-14 DIAGNOSIS — R531 Weakness: Secondary | ICD-10-CM | POA: Diagnosis not present

## 2021-05-16 DIAGNOSIS — R531 Weakness: Secondary | ICD-10-CM | POA: Diagnosis not present

## 2021-05-16 DIAGNOSIS — G8191 Hemiplegia, unspecified affecting right dominant side: Secondary | ICD-10-CM | POA: Diagnosis not present

## 2021-05-19 DIAGNOSIS — R531 Weakness: Secondary | ICD-10-CM | POA: Diagnosis not present

## 2021-05-19 DIAGNOSIS — G8191 Hemiplegia, unspecified affecting right dominant side: Secondary | ICD-10-CM | POA: Diagnosis not present

## 2021-05-20 DIAGNOSIS — H53461 Homonymous bilateral field defects, right side: Secondary | ICD-10-CM | POA: Diagnosis not present

## 2021-05-20 DIAGNOSIS — D3131 Benign neoplasm of right choroid: Secondary | ICD-10-CM | POA: Diagnosis not present

## 2021-05-20 DIAGNOSIS — H3500 Unspecified background retinopathy: Secondary | ICD-10-CM | POA: Diagnosis not present

## 2021-05-23 DIAGNOSIS — R531 Weakness: Secondary | ICD-10-CM | POA: Diagnosis not present

## 2021-05-23 DIAGNOSIS — G8191 Hemiplegia, unspecified affecting right dominant side: Secondary | ICD-10-CM | POA: Diagnosis not present

## 2021-05-27 DIAGNOSIS — R531 Weakness: Secondary | ICD-10-CM | POA: Diagnosis not present

## 2021-05-27 DIAGNOSIS — G8191 Hemiplegia, unspecified affecting right dominant side: Secondary | ICD-10-CM | POA: Diagnosis not present

## 2021-05-30 DIAGNOSIS — G8191 Hemiplegia, unspecified affecting right dominant side: Secondary | ICD-10-CM | POA: Diagnosis not present

## 2021-05-30 DIAGNOSIS — R531 Weakness: Secondary | ICD-10-CM | POA: Diagnosis not present

## 2021-06-02 DIAGNOSIS — R531 Weakness: Secondary | ICD-10-CM | POA: Diagnosis not present

## 2021-06-02 DIAGNOSIS — G8191 Hemiplegia, unspecified affecting right dominant side: Secondary | ICD-10-CM | POA: Diagnosis not present

## 2021-06-06 DIAGNOSIS — R531 Weakness: Secondary | ICD-10-CM | POA: Diagnosis not present

## 2021-06-06 DIAGNOSIS — G8191 Hemiplegia, unspecified affecting right dominant side: Secondary | ICD-10-CM | POA: Diagnosis not present

## 2021-06-10 DIAGNOSIS — G8191 Hemiplegia, unspecified affecting right dominant side: Secondary | ICD-10-CM | POA: Diagnosis not present

## 2021-06-10 DIAGNOSIS — R531 Weakness: Secondary | ICD-10-CM | POA: Diagnosis not present

## 2021-06-13 DIAGNOSIS — R531 Weakness: Secondary | ICD-10-CM | POA: Diagnosis not present

## 2021-06-13 DIAGNOSIS — G8191 Hemiplegia, unspecified affecting right dominant side: Secondary | ICD-10-CM | POA: Diagnosis not present

## 2021-06-17 DIAGNOSIS — R531 Weakness: Secondary | ICD-10-CM | POA: Diagnosis not present

## 2021-06-17 DIAGNOSIS — G8191 Hemiplegia, unspecified affecting right dominant side: Secondary | ICD-10-CM | POA: Diagnosis not present

## 2021-06-19 DIAGNOSIS — G8191 Hemiplegia, unspecified affecting right dominant side: Secondary | ICD-10-CM | POA: Diagnosis not present

## 2021-06-19 DIAGNOSIS — R531 Weakness: Secondary | ICD-10-CM | POA: Diagnosis not present

## 2021-06-30 ENCOUNTER — Other Ambulatory Visit: Payer: Self-pay | Admitting: Physical Medicine & Rehabilitation

## 2021-06-30 DIAGNOSIS — E539 Vitamin B deficiency, unspecified: Secondary | ICD-10-CM

## 2021-06-30 DIAGNOSIS — I63412 Cerebral infarction due to embolism of left middle cerebral artery: Secondary | ICD-10-CM

## 2021-06-30 DIAGNOSIS — G8191 Hemiplegia, unspecified affecting right dominant side: Secondary | ICD-10-CM | POA: Diagnosis not present

## 2021-06-30 DIAGNOSIS — R531 Weakness: Secondary | ICD-10-CM | POA: Diagnosis not present

## 2021-06-30 NOTE — Telephone Encounter (Signed)
Pharmacy is sending refill request for Keppra and Folic acid. Would you like to refill?

## 2021-07-08 DIAGNOSIS — G8191 Hemiplegia, unspecified affecting right dominant side: Secondary | ICD-10-CM | POA: Diagnosis not present

## 2021-07-08 DIAGNOSIS — R531 Weakness: Secondary | ICD-10-CM | POA: Diagnosis not present

## 2021-07-09 ENCOUNTER — Encounter: Payer: Medicare HMO | Attending: Physical Medicine & Rehabilitation | Admitting: Physical Medicine & Rehabilitation

## 2021-07-09 ENCOUNTER — Encounter: Payer: Self-pay | Admitting: Physical Medicine & Rehabilitation

## 2021-07-09 ENCOUNTER — Other Ambulatory Visit: Payer: Self-pay

## 2021-07-09 VITALS — BP 146/90 | HR 69 | Ht 69.0 in | Wt 270.0 lb

## 2021-07-09 DIAGNOSIS — G8110 Spastic hemiplegia affecting unspecified side: Secondary | ICD-10-CM | POA: Diagnosis not present

## 2021-07-09 DIAGNOSIS — I63412 Cerebral infarction due to embolism of left middle cerebral artery: Secondary | ICD-10-CM | POA: Insufficient documentation

## 2021-07-09 NOTE — Progress Notes (Signed)
Subjective:    Patient ID: Xavier Villegas, male    DOB: 1980-10-16, 40 y.o.   MRN: [id number]  HPI  Xavier Villegas is here in f/u of his left MCA infarct. He has been busy with PT and has been growing shitake mushrooms as well!   He is participating in therapy once a week currently. They ar working on [place].  Therapy is also working on gait without his brace.  We decided to hold up on botulinum toxin injections today to evaluate his current status/at his last visit in August we injected his pec muscles as well as his wrist and finger flexors and pronator teres.  Pain levels are controlled.  Pain Inventory Average Pain 5 Pain Right Now 4 My pain is intermittent and sharp  LOCATION OF PAIN  shoulder, wrist, hand, ankle  BOWEL Number of stools per week: 7 Oral laxative use No  Type of laxative . Enema or suppository use No  History of colostomy No  Incontinent No   BLADDER Normal In and out cath, frequency na Able to self cath  na Bladder incontinence No  Frequent urination No  Leakage with coughing No  Difficulty starting stream No  Incomplete bladder emptying No    Mobility use a cane how many minutes can you walk? 15 ability to climb steps?  yes do you drive?  no  Function not employed: date last employed 02/25/18 disabled: date disabled 02/25/18 I need assistance with the following:  bathing and meal prep  Neuro/Psych trouble walking  Prior Studies Any changes since last visit?  no  Physicians involved in your care Any changes since last visit?  no   Family History  Problem Relation Age of Onset   Stroke Maternal Grandmother    Liver cancer Maternal Grandmother    Stroke Maternal Grandfather    Leukemia Mother        CLL/Dr. Leretha Pol at [facility]   Diabetes Father    High blood pressure Father    Liver cancer Paternal Grandmother    Cerebral aneurysm Paternal Grandfather    Stroke Paternal Grandfather    Social History    Socioeconomic History   Marital status: Divorced    Spouse name: Not on file   Number of children: Not on file   Years of education: Not on file   Highest education level: Not on file  Occupational History   Not on file  Tobacco Use   Smoking status: Former    Packs/day: 0.50    Years: 8.00    Pack years: 4.00    Types: Cigarettes    Quit date: 02/19/2018    Years since quitting: 3.3   Smokeless tobacco: Never  Vaping Use   Vaping Use: Never used  Substance and Sexual Activity   Alcohol use: Not Currently    Alcohol/week: 14.0 standard drinks    Types: 14 Cans of beer per week   Drug use: Never   Sexual activity: Yes  Other Topics Concern   Not on file  Social History Narrative   Not on file   Social Determinants of Health   Financial Resource Strain: Not on file  Food Insecurity: Not on file  Transportation Needs: Not on file  Physical Activity: Not on file  Stress: Not on file  Social Connections: Not on file   Past Surgical History:  Procedure Laterality Date   CRANIOTOMY Left 02/21/2018   Procedure: DECOMPRESSIVE CRANIECTOMY with bone flap to abdomen;  Surgeon: [name], MD;  Location: [facility];  Service: Neurosurgery;  Laterality: Left;  DECOMPRESSIVE CRANIECTOMY with bone flap to abdomen   CRANIOTOMY N/A 05/02/2018   Procedure: RE-IMPLANTATION OF CRANIAL FLAP;  Surgeon: [name], MD;  Location: [facility];  Service: Neurosurgery;  Laterality: N/A;  RE-IMPLANTATION OF CRANIAL FLAP   IR ANGIO INTRA EXTRACRAN SEL COM CAROTID INNOMINATE UNI R MOD SED  02/19/2018   IR ANGIO VERTEBRAL SEL VERTEBRAL BILAT MOD SED  02/19/2018   IR CT HEAD LTD  02/19/2018   IR PERCUTANEOUS ART THROMBECTOMY/INFUSION INTRACRANIAL INC DIAG ANGIO  02/19/2018   IR US GUIDE VASC ACCESS RIGHT  02/19/2018   RADIOLOGY WITH ANESTHESIA N/A 02/19/2018   Procedure: IR WITH ANESTHESIA CODE STROKE;  Surgeon: Corrie Mckusick, DO;  Location: [facility];  Service: Anesthesiology;  Laterality: N/A;   TEE WITHOUT  CARDIOVERSION N/A 03/01/2018   Procedure: TRANSESOPHAGEAL ECHOCARDIOGRAM (TEE);  Surgeon: Sanda Klein, MD;  Location: [facility];  Service: Cardiovascular;  Laterality: N/A;   Past Medical History:  Diagnosis Date   Aphasia    GERD (gastroesophageal reflux disease)    Hemiparesis ([place]) 02/2018   right side   Hyperhomocysteinemia ([place])    Neurogenic bladder 02/2018   04/26/18 inporving   Spastic hemiparesis ([facility])    right side   Stroke ([facility])    Left MCA infart   Tobacco abuse    BP (!) 146/90   Pulse 69   Ht 5\' 9"  (1.753 m)   Wt 270 lb (122.5 kg)   SpO2 92%   BMI 39.87 kg/m   Opioid Risk Score:   Fall Risk Score:  `1  Depression screen PHQ 2/9  Depression screen [facility] 2/9 07/09/2021 04/09/2021 05/15/2020 03/07/2019 01/18/2019 07/11/2018 04/18/2018  Decreased Interest 0 1 0 0 0 0 0  Down, Depressed, Hopeless 0 1 0 0 0 0 0  PHQ - 2 Score 0 2 0 0 0 0 0  Altered sleeping - - - - 0 - -  Tired, decreased energy - - - - 0 - -  Change in appetite - - - - 0 - -  Feeling bad or failure about yourself  - - - - 0 - -  Trouble concentrating - - - - 0 - -  Moving slowly or fidgety/restless - - - - 0 - -  Suicidal thoughts - - - - 0 - -  PHQ-9 Score - - - - 0 - -  Difficult doing work/chores - - - - Not difficult at all - -  Some recent data might be hidden     Review of Systems  Musculoskeletal:  Positive for gait problem.       Shoulder, wrist, hand, ankle pain  All other systems reviewed and are negative.     Objective:   Physical Exam   Physical Exam General: No acute distress HEENT: NCAT, EOMI, oral membranes moist Cards: reg rate  Chest: normal effort Abdomen: Soft, NT, ND Skin: dry, intact Extremities: no edema Psych: pleasant and appropriate  Neuro: RUE 1-2/5 prox to trace distally. RLE 2 to 2+/5 to trace to 1/5.  Tone at the right wrist and finger flexors is 2-3 out of 4.  Pronators 2 out of 4.  Biceps 2/4.  Ongoing word finding deficits but showing continued  improvement.  Gait has improved with less circumduction.  He still employs a bit of a steppage gait pattern however.  Right foot lands enbloc on the floor with little heel strike.  There is some recurvatum at  the right knee during stance.  Pelvic balance is reasonable.  He walked without a cane today.  Musculoskeletal: minimal pain.            Assessment & Plan:  1.  Right hemiparesis, dysphagia, and aphasia secondary to large left MCA infarct with hemorrhagic transformation s/p hemicraniectomy. -Continue outpatient therapies.  He is making progress with his gait mechanics.  He should be working on this at home as well.  I encouraged him to do some walking without his AFO at home only. 2. Spasticity:  Good results after Botox to right upper extremity. - baclofen  20mg  QID   -continue splint/ROM   -Shoulder looks good today.  We will bring him back in a month or so for Dysport 500 units to the right wrist and finger flexors including the thumb.   Fifteen minutes of face to face patient care time were spent during this visit. All questions were encouraged and answered.  Follow up with me in a month or so.Marland Kitchen

## 2021-07-09 NOTE — Patient Instructions (Addendum)
PLEASE FEEL FREE TO CALL OUR OFFICE WITH ANY PROBLEMS OR QUESTIONS ([phone])   PRACTICE WALKING WITHOUT YOUR BRACE AT HOME. TRY TO ACHIEVE HEEL STRIKE WHEN YOUR RIGHT FOOT HITS THE FLOOR (INSTEAD OF YOUR WHOLE FOOT HITTING THE FLOOR AT THE SAME TIME).

## 2021-07-14 DIAGNOSIS — R531 Weakness: Secondary | ICD-10-CM | POA: Diagnosis not present

## 2021-07-14 DIAGNOSIS — G8191 Hemiplegia, unspecified affecting right dominant side: Secondary | ICD-10-CM | POA: Diagnosis not present

## 2021-07-21 DIAGNOSIS — G8191 Hemiplegia, unspecified affecting right dominant side: Secondary | ICD-10-CM | POA: Diagnosis not present

## 2021-07-21 DIAGNOSIS — R531 Weakness: Secondary | ICD-10-CM | POA: Diagnosis not present

## 2021-07-22 ENCOUNTER — Ambulatory Visit (INDEPENDENT_AMBULATORY_CARE_PROVIDER_SITE_OTHER): Payer: Medicare HMO | Admitting: Internal Medicine

## 2021-07-22 ENCOUNTER — Other Ambulatory Visit: Payer: Medicare HMO

## 2021-07-22 ENCOUNTER — Other Ambulatory Visit: Payer: Self-pay

## 2021-07-22 ENCOUNTER — Encounter: Payer: Self-pay | Admitting: Internal Medicine

## 2021-07-22 VITALS — BP 124/92 | HR 78 | Temp 98.8°F | Ht 68.62 in | Wt 269.8 lb

## 2021-07-22 DIAGNOSIS — Z23 Encounter for immunization: Secondary | ICD-10-CM | POA: Diagnosis not present

## 2021-07-22 DIAGNOSIS — I639 Cerebral infarction, unspecified: Secondary | ICD-10-CM

## 2021-07-22 DIAGNOSIS — R030 Elevated blood-pressure reading, without diagnosis of hypertension: Secondary | ICD-10-CM

## 2021-07-22 HISTORY — DX: Elevated blood-pressure reading, without diagnosis of hypertension: R03.0

## 2021-07-22 NOTE — Patient Instructions (Signed)
Blood Pressure Record Sheet To take your blood pressure, you will need a blood pressure machine. You can buy a blood pressure machine (blood pressure monitor) at your clinic, drug store, or online. When choosing one, consider: An automatic monitor that has an arm cuff. A cuff that wraps snugly around your upper arm. You should be able to fit only one finger between your arm and the cuff. A device that stores blood pressure reading results. Do not choose a monitor that measures your blood pressure from your wrist or finger. Follow your health care provider's instructions for how to take your blood pressure. To use this form: Get one reading in the morning (a.m.) before you take any medicines. Get one reading in the evening (p.m.) before supper. Take at least 2 readings with each blood pressure check. This makes sure the results are correct. Wait 1-2 minutes between measurements. Write down the results in the spaces on this form. Repeat this once a week, or as told by your health care provider. Make a follow-up appointment with your health care provider to discuss the results. Blood pressure log Date: _______________________ a.m. _____________________(1st reading) _____________________(2nd reading) p.m. _____________________(1st reading) _____________________(2nd reading) Date: _______________________ a.m. _____________________(1st reading) _____________________(2nd reading) p.m. _____________________(1st reading) _____________________(2nd reading) Date: _______________________ a.m. _____________________(1st reading) _____________________(2nd reading) p.m. _____________________(1st reading) _____________________(2nd reading) Date: _______________________ a.m. _____________________(1st reading) _____________________(2nd reading) p.m. _____________________(1st reading) _____________________(2nd reading) Date: _______________________ a.m. _____________________(1st reading)  _____________________(2nd reading) p.m. _____________________(1st reading) _____________________(2nd reading) This information is not intended to replace advice given to you by your health care provider. Make sure you discuss any questions you have with your health care provider. Document Revised: 11/14/2019 Document Reviewed: 11/15/2019 Elsevier Patient Education  [place].
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
## 2021-07-23 ENCOUNTER — Other Ambulatory Visit: Payer: Medicare HMO

## 2021-07-23 DIAGNOSIS — I639 Cerebral infarction, unspecified: Secondary | ICD-10-CM | POA: Diagnosis not present

## 2021-07-24 LAB — CBC WITH DIFFERENTIAL/PLATELET
Basophils Absolute: 0 10*3/uL (ref 0.0–0.2)
Basos: 0 %
EOS (ABSOLUTE): 0.1 10*3/uL (ref 0.0–0.4)
Eos: 1 %
Hematocrit: 53.2 % — ABNORMAL HIGH (ref 37.5–51.0)
Hemoglobin: 18.1 g/dL — ABNORMAL HIGH (ref 13.0–17.7)
Immature Grans (Abs): 0 10*3/uL (ref 0.0–0.1)
Immature Granulocytes: 0 %
Lymphocytes Absolute: 2.9 10*3/uL (ref 0.7–3.1)
Lymphs: 26 %
MCH: 31.5 pg (ref 26.6–33.0)
MCHC: 34 g/dL (ref 31.5–35.7)
MCV: 93 fL (ref 79–97)
Monocytes Absolute: 0.6 10*3/uL (ref 0.1–0.9)
Monocytes: 5 %
Neutrophils Absolute: 7.4 10*3/uL — ABNORMAL HIGH (ref 1.4–7.0)
Neutrophils: 68 %
Platelets: 250 10*3/uL (ref 150–450)
RBC: 5.74 x10E6/uL (ref 4.14–5.80)
RDW: 13.3 % (ref 11.6–15.4)
WBC: 11.1 10*3/uL — ABNORMAL HIGH (ref 3.4–10.8)

## 2021-07-24 LAB — LIPID PANEL
Chol/HDL Ratio: 5.1 ratio — ABNORMAL HIGH (ref 0.0–5.0)
Cholesterol, Total: 193 mg/dL (ref 100–199)
HDL: 38 mg/dL — ABNORMAL LOW (ref >39)
LDL Chol Calc (NIH): 135 mg/dL — ABNORMAL HIGH (ref 0–99)
Triglycerides: 107 mg/dL (ref 0–149)
VLDL Cholesterol Cal: 20 mg/dL (ref 5–40)

## 2021-07-24 LAB — COMPREHENSIVE METABOLIC PANEL
ALT: 39 IU/L (ref 0–44)
AST: 20 IU/L (ref 0–40)
Albumin/Globulin Ratio: 1.5 (ref 1.2–2.2)
Albumin: 4.7 g/dL (ref 4.0–5.0)
Alkaline Phosphatase: 137 IU/L — ABNORMAL HIGH (ref 44–121)
BUN/Creatinine Ratio: 9 (ref 9–20)
BUN: 9 mg/dL (ref 6–20)
Bilirubin Total: 0.8 mg/dL (ref 0.0–1.2)
CO2: 23 mmol/L (ref 20–29)
Calcium: 9.8 mg/dL (ref 8.7–10.2)
Chloride: 96 mmol/L (ref 96–106)
Creatinine, Ser: 1.01 mg/dL (ref 0.76–1.27)
Globulin, Total: 3.2 g/dL (ref 1.5–4.5)
Glucose: 91 mg/dL (ref 70–99)
Potassium: 4.1 mmol/L (ref 3.5–5.2)
Sodium: 139 mmol/L (ref 134–144)
Total Protein: 7.9 g/dL (ref 6.0–8.5)
eGFR: 97 mL/min/{1.73_m2} (ref >59)

## 2021-07-24 LAB — TSH: TSH: 2.2 u[IU]/mL (ref 0.450–4.500)

## 2021-07-25 DIAGNOSIS — I639 Cerebral infarction, unspecified: Secondary | ICD-10-CM | POA: Diagnosis not present

## 2021-07-26 LAB — URINALYSIS, ROUTINE W REFLEX MICROSCOPIC
Bilirubin, UA: NEGATIVE
Glucose, UA: NEGATIVE
Ketones, UA: NEGATIVE
Leukocytes,UA: NEGATIVE
Nitrite, UA: NEGATIVE
Protein,UA: NEGATIVE
RBC, UA: NEGATIVE
Specific Gravity, UA: 1.015 (ref 1.005–1.030)
Urobilinogen, Ur: 0.2 mg/dL (ref 0.2–1.0)
pH, UA: 5.5 (ref 5.0–7.5)

## 2021-07-28 DIAGNOSIS — G8191 Hemiplegia, unspecified affecting right dominant side: Secondary | ICD-10-CM | POA: Diagnosis not present

## 2021-07-28 DIAGNOSIS — R531 Weakness: Secondary | ICD-10-CM | POA: Diagnosis not present

## 2021-07-30 ENCOUNTER — Other Ambulatory Visit: Payer: Self-pay

## 2021-07-30 ENCOUNTER — Encounter: Payer: Self-pay | Admitting: Physical Medicine & Rehabilitation

## 2021-07-30 ENCOUNTER — Encounter: Payer: Medicare HMO | Attending: Physical Medicine & Rehabilitation | Admitting: Physical Medicine & Rehabilitation

## 2021-07-30 VITALS — BP 138/90 | HR 87 | Temp 98.3°F | Ht 68.0 in | Wt 269.0 lb

## 2021-07-30 DIAGNOSIS — G8191 Hemiplegia, unspecified affecting right dominant side: Secondary | ICD-10-CM | POA: Diagnosis not present

## 2021-07-30 DIAGNOSIS — G8111 Spastic hemiplegia affecting right dominant side: Secondary | ICD-10-CM | POA: Diagnosis not present

## 2021-07-30 DIAGNOSIS — G8110 Spastic hemiplegia affecting unspecified side: Secondary | ICD-10-CM | POA: Diagnosis not present

## 2021-07-30 NOTE — Progress Notes (Signed)
Dysport Injection for spasticity using needle EMG guidance Indication:  Right hemiparesis (HCC)  Spastic hemiparesis affecting dominant side (HCC)   Dilution: 500 Units/15ml        Total Units Injected:  500 Indication: Severe spasticity which interferes with ADL,mobility and/or  hygiene and is unresponsive to medication management and other conservative care Informed consent was obtained after describing risks and benefits of the procedure with the patient. This includes bleeding, bruising, infection, excessive weakness, or medication side effects. A REMS form is on file and signed.  Right arm Needle: 74mm injectable monopolar needle electrode  Number of units per muscle Pectoralis Major 0 units Pectoralis Minor 0 units Biceps 0 units Brachioradialis 0 units FCR 25 units FCU 25 units FDS 150 units FDP 150 units FPL 0 units Palmaris longus 50 units Pronator Teres 100 units Pronator Quadratus 0 units Lumbricals 0 units  All injections were done after obtaining appropriate EMG activity and after negative drawback for blood. The patient tolerated the procedure well. Post procedure instructions were given.

## 2021-07-30 NOTE — Patient Instructions (Signed)
PLEASE FEEL FREE TO CALL OUR OFFICE WITH ANY PROBLEMS OR QUESTIONS ([phone])      

## 2021-08-22 ENCOUNTER — Other Ambulatory Visit: Payer: Self-pay

## 2021-08-22 ENCOUNTER — Encounter: Payer: Self-pay | Admitting: Internal Medicine

## 2021-08-22 ENCOUNTER — Ambulatory Visit (INDEPENDENT_AMBULATORY_CARE_PROVIDER_SITE_OTHER): Payer: Medicare HMO | Admitting: Internal Medicine

## 2021-08-22 VITALS — BP 137/87 | HR 90 | Temp 98.9°F | Ht 68.11 in | Wt 270.6 lb

## 2021-08-22 DIAGNOSIS — Z6841 Body Mass Index (BMI) 40.0 and over, adult: Secondary | ICD-10-CM | POA: Diagnosis not present

## 2021-08-22 DIAGNOSIS — R030 Elevated blood-pressure reading, without diagnosis of hypertension: Secondary | ICD-10-CM

## 2021-08-22 DIAGNOSIS — G8191 Hemiplegia, unspecified affecting right dominant side: Secondary | ICD-10-CM | POA: Diagnosis not present

## 2021-08-22 DIAGNOSIS — I639 Cerebral infarction, unspecified: Secondary | ICD-10-CM

## 2021-08-22 MED ORDER — ATORVASTATIN CALCIUM 40 MG PO TABS: 40.0000 mg | ORAL_TABLET | Freq: Every day | ORAL | 3 refills | Status: DC

## 2021-08-22 NOTE — Progress Notes (Signed)
BP 137/87    Pulse 90    Temp 98.9 F (37.2 C) (Oral)    Ht 5' 8.11" (1.73 m)    Wt 270 lb 9.6 oz (122.7 kg)    SpO2 98%    BMI 41.01 kg/m    Subjective:    Patient ID: Xavier Villegas, male    DOB: 04/07/1981, 41 y.o.   MRN: [id number]  Chief Complaint  Patient presents with   CVA    HPI: Xavier Villegas is a 41 y.o. male  Has had botox shots for releiving spasticity on the right arm and forearm.  Hypertension This is a chronic problem. The current episode started more than 1 month ago. The problem is uncontrolled. Pertinent negatives include no anxiety, blurred vision, chest pain, headaches, malaise/fatigue, neck pain, orthopnea, palpitations, peripheral edema, PND, shortness of breath or sweats. There is no history of chronic renal disease.  Hyperlipidemia This is a chronic problem. The problem is uncontrolled. He has no history of chronic renal disease. Pertinent negatives include no chest pain or shortness of breath.   Chief Complaint  Patient presents with   CVA    Relevant past medical, surgical, family and social history reviewed and updated as indicated. Interim medical history since our last visit reviewed. Allergies and medications reviewed and updated.  Review of Systems  Constitutional:  Negative for malaise/fatigue.  Eyes:  Negative for blurred vision.  Respiratory:  Negative for shortness of breath.   Cardiovascular:  Negative for chest pain, palpitations, orthopnea and PND.  Musculoskeletal:  Negative for neck pain.  Neurological:  Negative for headaches.   Per HPI unless specifically indicated above     Objective:    BP 137/87    Pulse 90    Temp 98.9 F (37.2 C) (Oral)    Ht 5' 8.11" (1.73 m)    Wt 270 lb 9.6 oz (122.7 kg)    SpO2 98%    BMI 41.01 kg/m   Wt Readings from Last 3 Encounters:  08/22/21 270 lb 9.6 oz (122.7 kg)  07/30/21 269 lb (122 kg)  07/22/21 269 lb 12.8 oz (122.4 kg)    Physical Exam Vitals and nursing note  reviewed.  Constitutional:      General: He is not in acute distress.    Appearance: Normal appearance. He is not ill-appearing or diaphoretic.  HENT:     Right Ear: Tympanic membrane and external ear normal. There is no impacted cerumen.     Left Ear: External ear normal.     Nose: No congestion or rhinorrhea.     Mouth/Throat:     Pharynx: No oropharyngeal exudate or posterior oropharyngeal erythema.  Eyes:     Conjunctiva/sclera: Conjunctivae normal.     Pupils: Pupils are equal, round, and reactive to light.  Cardiovascular:     Rate and Rhythm: Normal rate and regular rhythm.     Heart sounds: No murmur heard.   No friction rub. No gallop.  Pulmonary:     Effort: No respiratory distress.     Breath sounds: No stridor. No wheezing or rhonchi.  Chest:     Chest wall: No tenderness.  Abdominal:     General: Abdomen is flat. Bowel sounds are normal.     Palpations: Abdomen is soft. There is no mass.     Tenderness: There is no abdominal tenderness.  Musculoskeletal:     Cervical back: Normal range of motion and neck supple. No rigidity or tenderness.  Left lower leg: No edema.  Skin:    General: Skin is warm and dry.  Neurological:     Mental Status: He is alert.     Cranial Nerves: No cranial nerve deficit.     Motor: No weakness.     Coordination: Coordination normal.     Gait: Gait normal.     Deep Tendon Reflexes: Reflexes normal.  Psychiatric:        Mood and Affect: Mood normal.        Behavior: Behavior normal.    Results for orders placed or performed in visit on 07/22/21  CBC with Differential/Platelet  Result Value Ref Range   WBC 11.1 (H) 3.4 - 10.8 x10E3/uL   RBC 5.74 4.14 - 5.80 x10E6/uL   Hemoglobin 18.1 (H) 13.0 - 17.7 g/dL   Hematocrit 53.2 (H) 37.5 - 51.0 %   MCV 93 79 - 97 fL   MCH 31.5 26.6 - 33.0 pg   MCHC 34.0 31.5 - 35.7 g/dL   RDW 13.3 11.6 - 15.4 %   Platelets 250 150 - 450 x10E3/uL   Neutrophils 68 Not Estab. %   Lymphs 26 Not  Estab. %   Monocytes 5 Not Estab. %   Eos 1 Not Estab. %   Basos 0 Not Estab. %   Neutrophils Absolute 7.4 (H) 1.4 - 7.0 x10E3/uL   Lymphocytes Absolute 2.9 0.7 - 3.1 x10E3/uL   Monocytes Absolute 0.6 0.1 - 0.9 x10E3/uL   EOS (ABSOLUTE) 0.1 0.0 - 0.4 x10E3/uL   Basophils Absolute 0.0 0.0 - 0.2 x10E3/uL   Immature Granulocytes 0 Not Estab. %   Immature Grans (Abs) 0.0 0.0 - 0.1 x10E3/uL  Comprehensive metabolic panel  Result Value Ref Range   Glucose 91 70 - 99 mg/dL   BUN 9 6 - 20 mg/dL   Creatinine, Ser 1.01 0.76 - 1.27 mg/dL   eGFR 97 >59 mL/min/1.73   BUN/Creatinine Ratio 9 9 - 20   Sodium 139 134 - 144 mmol/L   Potassium 4.1 3.5 - 5.2 mmol/L   Chloride 96 96 - 106 mmol/L   CO2 23 20 - 29 mmol/L   Calcium 9.8 8.7 - 10.2 mg/dL   Total Protein 7.9 6.0 - 8.5 g/dL   Albumin 4.7 4.0 - 5.0 g/dL   Globulin, Total 3.2 1.5 - 4.5 g/dL   Albumin/Globulin Ratio 1.5 1.2 - 2.2   Bilirubin Total 0.8 0.0 - 1.2 mg/dL   Alkaline Phosphatase 137 (H) 44 - 121 IU/L   AST 20 0 - 40 IU/L   ALT 39 0 - 44 IU/L  TSH  Result Value Ref Range   TSH 2.200 0.450 - 4.500 uIU/mL  Urinalysis, Routine w reflex microscopic  Result Value Ref Range   Specific Gravity, UA 1.015 1.005 - 1.030   pH, UA 5.5 5.0 - 7.5   Color, UA Yellow Yellow   Appearance Ur Clear Clear   Leukocytes,UA Negative Negative   Protein,UA Negative Negative/Trace   Glucose, UA Negative Negative   Ketones, UA Negative Negative   RBC, UA Negative Negative   Bilirubin, UA Negative Negative   Urobilinogen, Ur 0.2 0.2 - 1.0 mg/dL   Nitrite, UA Negative Negative   Microscopic Examination Comment   Lipid panel  Result Value Ref Range   Cholesterol, Total 193 100 - 199 mg/dL   Triglycerides 107 0 - 149 mg/dL   HDL 38 (L) >39 mg/dL   VLDL Cholesterol Cal 20 5 - 40 mg/dL   LDL  Chol Calc (NIH) 135 (H) 0 - 99 mg/dL   Chol/HDL Ratio 5.1 (H) 0.0 - 5.0 ratio        Current Outpatient Medications:    aspirin 325 MG tablet, Take 1  tablet (325 mg total) by mouth daily., Disp: , Rfl:    atorvastatin (LIPITOR) 40 MG tablet, Take 1 tablet (40 mg total) by mouth daily., Disp: 30 tablet, Rfl: 3   baclofen (LIORESAL) 20 MG tablet, Take 1 tablet (20 mg total) by mouth 4 (four) times daily., Disp: 360 each, Rfl: 3   cholecalciferol (VITAMIN D3) 25 MCG (1000 UT) tablet, Take 1,000 Units by mouth daily., Disp: , Rfl:    cyanocobalamin 1000 MCG tablet, Take 1 tablet (1,000 mcg total) by mouth daily., Disp: 30 tablet, Rfl: 2   FLUARIX QUADRIVALENT 0.5 ML injection, , Disp: , Rfl:    folic acid (FOLVITE) 1 MG tablet, TAKE 1 TABLET FOUR TIMES DAILY, Disp: 360 tablet, Rfl: 3   levETIRAcetam (KEPPRA) 750 MG tablet, TAKE 1 TABLET (750 MG) BY MOUTH 2 (TWO) TIMES DAILY., Disp: 180 tablet, Rfl: 3   OVER THE COUNTER MEDICATION, Take 2 capsules by mouth See admin instructions. Psyllium fiber 2 capsules by mouth everyday, Disp: , Rfl:    pantoprazole (PROTONIX) 40 MG tablet, Take 40 mg by mouth daily as needed (for GERD). , Disp: , Rfl:    pyridOXINE (B-6) 50 MG tablet, Take 1 tablet (50 mg total) by mouth daily., Disp: 30 tablet, Rfl: 2    Assessment & Plan:  HLD:  Will need to start pt on lipitor given ho CVA LDL goal < 70 ideally < 50  recheck FLP, check LFT's work on diet, SE of meds explained to pt. low fat and high fiber diet explained to pt.   Latest Reference Range & Units 07/23/21 08:41  Total CHOL/HDL Ratio 0.0 - 5.0 ratio 5.1 (H)  Cholesterol, Total 100 - 199 mg/dL 193  HDL Cholesterol >39 mg/dL 38 (L)  Triglycerides 0 - 149 mg/dL 107  VLDL Cholesterol Cal 5 - 40 mg/dL 20  LDL Chol Calc (NIH) 0 - 99 mg/dL 135 (H)  (H): Data is abnormally high (L): Data is abnormally low  Prehypertension BP better this time.  Has had logs but dad forgot this @ home .AVPRE CVA with right sided hemiparesis and expressive aphasia history of left MCA infarct with small ICH/SDH hemorrhagic conversion s/p craniotomy. x 2019 chronic, stable, seen  PT in the past, seeing Physical Medicine and Rehabilitation physicians for botox shots of spasticity Improving  Sees neuro once a year Is on ASA,  Is on keppra  GERD is on protonix as needed.  patient advised to avoid laying down soon after his meals. He took a 2 hours between dinner and bedtime. Avoid spicy food and triggers that he knows food wise that worsen his acid reflux. Patient verbalized understanding of the above. Lifestyle modifications as above discussed with patient.   Problem List Items Addressed This Visit       Cardiovascular and Mediastinum   CVA (cerebral vascular accident) ([place])   Relevant Medications   atorvastatin (LIPITOR) 40 MG tablet     Nervous and Auditory   Right hemiparesis ([place]) - Primary     Other   Prehypertension     No orders of the defined types were placed in this encounter.    Meds ordered this encounter  Medications   atorvastatin (LIPITOR) 40 MG tablet    Sig: Take 1 tablet (  40 mg total) by mouth daily.    Dispense:  30 tablet    Refill:  3     Follow up plan: Return in about 6 months (around 02/19/2022).

## 2021-08-28 ENCOUNTER — Ambulatory Visit (INDEPENDENT_AMBULATORY_CARE_PROVIDER_SITE_OTHER): Payer: Medicare HMO | Admitting: *Deleted

## 2021-08-28 DIAGNOSIS — Z0000 Encounter for general adult medical examination without abnormal findings: Secondary | ICD-10-CM

## 2021-08-28 NOTE — Patient Instructions (Signed)
Xavier Villegas , Thank you for taking time to come for your Medicare Wellness Visit. I appreciate your ongoing commitment to your health goals. Please review the following plan we discussed and let me know if I can assist you in the future.   Screening recommendations/referrals:  Recommended yearly ophthalmology/optometry visit for glaucoma screening and checkup Recommended yearly dental visit for hygiene and checkup  Vaccinations: Influenza vaccine: up to date Pneumococcal vaccine: up to date Tdap vaccine: Education provided   Advanced directives: Education provided  Conditions/risks identified:   Next appointment: 02-19-2022 @ 9:00 [name]  Preventive Care , Male Preventive care refers to lifestyle choices and visits with your health care provider that can promote health and wellness. What does preventive care include? A yearly physical exam. This is also called an annual well check. Dental exams once or twice a year. Routine eye exams. Ask your health care provider how often you should have your eyes checked. Personal lifestyle choices, including: Daily care of your teeth and gums. Regular physical activity. Eating a healthy diet. Avoiding tobacco and drug use. Limiting alcohol use. Practicing safe sex. Taking low-dose aspirin every day starting at age 74. What happens during an annual well check? The services and screenings done by your health care provider during your annual well check will depend on your age, overall health, lifestyle risk factors, and family history of disease. Counseling  Your health care provider may ask you questions about your: Alcohol use. Tobacco use. Drug use. Emotional well-being. Home and relationship well-being. Sexual activity. Eating habits. Work and work Statistician. Screening  You may have the following tests or measurements: Height, weight, and BMI. Blood pressure. Lipid and cholesterol levels. These may be checked every 5 years, or  more frequently if you are over 29 years old. Skin check. Lung cancer screening. You may have this screening every year starting at age 59 if you have a 30-pack-year history of smoking and currently smoke or have quit within the past 15 years. Fecal occult blood test (FOBT) of the stool. You may have this test every year starting at age 63. Flexible sigmoidoscopy or colonoscopy. You may have a sigmoidoscopy every 5 years or a colonoscopy every 10 years starting at age 80. Prostate cancer screening. Recommendations will vary depending on your family history and other risks. Hepatitis C blood test. Hepatitis B blood test. Sexually transmitted disease (STD) testing. Diabetes screening. This is done by checking your blood sugar (glucose) after you have not eaten for a while (fasting). You may have this done every 1-3 years. Discuss your test results, treatment options, and if necessary, the need for more tests with your health care provider. Vaccines  Your health care provider may recommend certain vaccines, such as: Influenza vaccine. This is recommended every year. Tetanus, diphtheria, and acellular pertussis (Tdap, Td) vaccine. You may need a Td booster every 10 years. Zoster vaccine. You may need this after age 87. Pneumococcal 13-valent conjugate (PCV13) vaccine. You may need this if you have certain conditions and have not been vaccinated. Pneumococcal polysaccharide (PPSV23) vaccine. You may need one or two doses if you smoke cigarettes or if you have certain conditions. Talk to your health care provider about which screenings and vaccines you need and how often you need them. This information is not intended to replace advice given to you by your health care provider. Make sure you discuss any questions you have with your health care provider. Document Released: 08/23/2015 Document Revised: 04/15/2016 Document Reviewed: 05/28/2015 Elsevier  Interactive Patient Education  2017 [place] Prevention in the Home Falls can cause injuries. They can happen to people of all ages. There are many things you can do to make your home safe and to help prevent falls. What can I do on the outside of my home? Regularly fix the edges of walkways and driveways and fix any cracks. Remove anything that might make you trip as you walk through a door, such as a raised step or threshold. Trim any bushes or trees on the path to your home. Use bright outdoor lighting. Clear any walking paths of anything that might make someone trip, such as rocks or tools. Regularly check to see if handrails are loose or broken. Make sure that both sides of any steps have handrails. Any raised decks and porches should have guardrails on the edges. Have any leaves, snow, or ice cleared regularly. Use sand or salt on walking paths during winter. Clean up any spills in your garage right away. This includes oil or grease spills. What can I do in the bathroom? Use night lights. Install grab bars by the toilet and in the tub and shower. Do not use towel bars as grab bars. Use non-skid mats or decals in the tub or shower. If you need to sit down in the shower, use a plastic, non-slip stool. Keep the floor dry. Clean up any water that spills on the floor as soon as it happens. Remove soap buildup in the tub or shower regularly. Attach bath mats securely with double-sided non-slip rug tape. Do not have throw rugs and other things on the floor that can make you trip. What can I do in the bedroom? Use night lights. Make sure that you have a light by your bed that is easy to reach. Do not use any sheets or blankets that are too big for your bed. They should not hang down onto the floor. Have a firm chair that has side arms. You can use this for support while you get dressed. Do not have throw rugs and other things on the floor that can make you trip. What can I do in the kitchen? Clean up any spills right  away. Avoid walking on wet floors. Keep items that you use a lot in easy-to-reach places. If you need to reach something above you, use a strong step stool that has a grab bar. Keep electrical cords out of the way. Do not use floor polish or wax that makes floors slippery. If you must use wax, use non-skid floor wax. Do not have throw rugs and other things on the floor that can make you trip. What can I do with my stairs? Do not leave any items on the stairs. Make sure that there are handrails on both sides of the stairs and use them. Fix handrails that are broken or loose. Make sure that handrails are as long as the stairways. Check any carpeting to make sure that it is firmly attached to the stairs. Fix any carpet that is loose or worn. Avoid having throw rugs at the top or bottom of the stairs. If you do have throw rugs, attach them to the floor with carpet tape. Make sure that you have a light switch at the top of the stairs and the bottom of the stairs. If you do not have them, ask someone to add them for you. What else can I do to help prevent falls? Wear shoes that: Do not have high  heels. Have rubber bottoms. Are comfortable and fit you well. Are closed at the toe. Do not wear sandals. If you use a stepladder: Make sure that it is fully opened. Do not climb a closed stepladder. Make sure that both sides of the stepladder are locked into place. Ask someone to hold it for you, if possible. Clearly mark and make sure that you can see: Any grab bars or handrails. First and last steps. Where the edge of each step is. Use tools that help you move around (mobility aids) if they are needed. These include: Canes. Walkers. Scooters. Crutches. Turn on the lights when you go into a dark area. Replace any light bulbs as soon as they burn out. Set up your furniture so you have a clear path. Avoid moving your furniture around. If any of your floors are uneven, fix them. If there are any  pets around you, be aware of where they are. Review your medicines with your doctor. Some medicines can make you feel dizzy. This can increase your chance of falling. Ask your doctor what other things that you can do to help prevent falls. This information is not intended to replace advice given to you by your health care provider. Make sure you discuss any questions you have with your health care provider. Document Released: 05/23/2009 Document Revised: 01/02/2016 Document Reviewed: 08/31/2014 Elsevier Interactive Patient Education  2017 [employer].

## 2021-08-28 NOTE — Progress Notes (Signed)
Subjective:   Caydence Enck is a 41 y.o. male who presents for Medicare Annual/Subsequent preventive examination.  I connected with  Lissandro Dilorenzo on 08/28/21 by a telephone  father Chrissie Noa interpreted for patient.  Patient in room . enabled telemedicine application and verified that I am speaking with the correct person using two identifiers.   I discussed the limitations of evaluation and management by telemedicine. The patient expressed understanding and agreed to proceed.  Patient location: home  Provider location: Tele-Health not in office      Review of Systems     Cardiac Risk Factors include: advanced age (>38men, >64 women);male gender;obesity (BMI >30kg/m2) (CVA)     Objective:    There were no vitals filed for this visit. There is no height or weight on file to calculate BMI.  Advanced Directives 08/28/2021 03/21/2021 02/07/2020 04/26/2019 09/22/2018 08/02/2018 05/02/2018  Does Patient Have a Medical Advance Directive? No No No No No No No  Would patient like information on creating a medical advance directive? No - Patient declined No - Patient declined No - Guardian declined;No - Patient declined Yes (MAU/Ambulatory/Procedural Areas - Information given) No - Patient declined No - Patient declined No - Patient declined    Current Medications (verified) Outpatient Encounter Medications as of 08/28/2021  Medication Sig   aspirin 325 MG tablet Take 1 tablet (325 mg total) by mouth daily.   atorvastatin (LIPITOR) 40 MG tablet Take 1 tablet (40 mg total) by mouth daily.   baclofen (LIORESAL) 20 MG tablet Take 1 tablet (20 mg total) by mouth 4 (four) times daily.   cholecalciferol (VITAMIN D3) 25 MCG (1000 UT) tablet Take 1,000 Units by mouth daily.   cyanocobalamin 1000 MCG tablet Take 1 tablet (1,000 mcg total) by mouth daily.   folic acid (FOLVITE) 1 MG tablet TAKE 1 TABLET FOUR TIMES DAILY   levETIRAcetam (KEPPRA) 750 MG tablet TAKE 1 TABLET (750 MG)  BY MOUTH 2 (TWO) TIMES DAILY.   OVER THE COUNTER MEDICATION Take 2 capsules by mouth See admin instructions. Psyllium fiber 2 capsules by mouth everyday   pantoprazole (PROTONIX) 40 MG tablet Take 40 mg by mouth daily as needed (for GERD).    pyridOXINE (B-6) 50 MG tablet Take 1 tablet (50 mg total) by mouth daily.   FLUARIX QUADRIVALENT 0.5 ML injection  (Patient not taking: Reported on 08/28/2021)   No facility-administered encounter medications on file as of 08/28/2021.    Allergies (verified) Patient has no known allergies.   History: Past Medical History:  Diagnosis Date   Aphasia    GERD (gastroesophageal reflux disease)    Hemiparesis ([place]) 02/2018   right side   Hyperhomocysteinemia (HCC)    Neurogenic bladder 02/2018   04/26/18 inporving   Spastic hemiparesis (HCC)    right side   Stroke ([place])    Left MCA infart   Tobacco abuse    Past Surgical History:  Procedure Laterality Date   CRANIOTOMY Left 02/21/2018   Procedure: DECOMPRESSIVE CRANIECTOMY with bone flap to abdomen;  Surgeon: [name], MD;  Location: [facility];  Service: Neurosurgery;  Laterality: Left;  DECOMPRESSIVE CRANIECTOMY with bone flap to abdomen   CRANIOTOMY N/A 05/02/2018   Procedure: RE-IMPLANTATION OF CRANIAL FLAP;  Surgeon: [name], MD;  Location: [place];  Service: Neurosurgery;  Laterality: N/A;  RE-IMPLANTATION OF CRANIAL FLAP   IR ANGIO INTRA EXTRACRAN SEL COM CAROTID INNOMINATE UNI R MOD SED  02/19/2018   IR ANGIO VERTEBRAL SEL VERTEBRAL  BILAT MOD SED  02/19/2018   IR CT HEAD LTD  02/19/2018   IR PERCUTANEOUS ART THROMBECTOMY/INFUSION INTRACRANIAL INC DIAG ANGIO  02/19/2018   IR US GUIDE VASC ACCESS RIGHT  02/19/2018   RADIOLOGY WITH ANESTHESIA N/A 02/19/2018   Procedure: IR WITH ANESTHESIA CODE STROKE;  Surgeon: Corrie Mckusick, DO;  Location: [place];  Service: Anesthesiology;  Laterality: N/A;   TEE WITHOUT CARDIOVERSION N/A 03/01/2018   Procedure: TRANSESOPHAGEAL ECHOCARDIOGRAM (TEE);  Surgeon:  Sanda Klein, MD;  Location: [facility] ENDOSCOPY;  Service: Cardiovascular;  Laterality: N/A;   Family History  Problem Relation Age of Onset   Arthritis Mother    Leukemia Mother        CLL/[facility] Leretha [facility] at [facility]   Hypertension Father    Diabetes Father    Heart disease Maternal Grandmother    Cancer Maternal Grandfather    Liver cancer Paternal Grandmother    Cerebral aneurysm Paternal Grandfather    Social History   Socioeconomic History   Marital status: Divorced    Spouse name: Not on file   Number of children: Not on file   Years of education: Not on file   Highest education level: Not on file  Occupational History   Not on file  Tobacco Use   Smoking status: Former    Packs/day: 0.50    Years: 8.00    Pack years: 4.00    Types: Cigarettes    Quit date: 02/19/2018    Years since quitting: 3.5   Smokeless tobacco: Never  Vaping Use   Vaping Use: Never used  Substance and Sexual Activity   Alcohol use: Not Currently    Alcohol/week: 14.0 standard drinks    Types: 14 Cans of beer per week   Drug use: Never   Sexual activity: Yes  Other Topics Concern   Not on file  Social History Narrative   Not on file   Social Determinants of Health   Financial Resource Strain: Low Risk    Difficulty of Paying Living Expenses: Not very hard  Food Insecurity: Not on file  Transportation Needs: Not on file  Physical Activity: Not on file  Stress: No Stress Concern Present   Feeling of Stress : Only a little  Social Connections: Unknown   Frequency of Communication with Friends and Family: Three times a week   Frequency of Social Gatherings with Friends and Family: More than three times a week   Attends Religious Services: Never   Marine scientist or Organizations: No   Attends Music therapist: Never   Marital Status: Not on file    Tobacco Counseling Counseling given: Not Answered   Clinical Intake:  Pre-visit preparation completed: Yes  Pain :  No/denies pain     Nutritional Risks: None Diabetes: No     Diabetic?  no  Interpreter Needed?: No  Information entered by :: Leroy Kennedy LPN   Activities of Daily Living In your present state of health, do you have any difficulty performing the following activities: 08/28/2021 08/28/2021  Hearing? N N  Vision? N N  Difficulty concentrating or making decisions? N N  Walking or climbing stairs? Y Y  Dressing or bathing? Y Y  Doing errands, shopping? Tempie Donning  Preparing Food and eating ? Y N  Using the Toilet? N N  In the past six months, have you accidently leaked urine? N N  Do you have problems with loss of bowel control? N N  Managing your Medications? Darreld Mclean  Y  Managing your Finances? Y N  Housekeeping or managing your Housekeeping? Tempie Donning  Some recent data might be hidden    Patient Care Team: Charlynne Cousins, MD as PCP - General (Internal Medicine) Josue Hector, MD as PCP - Cardiology (Cardiology)  Indicate any recent Medical Services you may have received from other than [facility] providers in the past year (date may be approximate).     Assessment:   This is a routine wellness examination for [employer].  Hearing/Vision screen Hearing Screening - Comments:: No trouble hearing Vision Screening - Comments:: Dr. Kerin Ransom  Not up to date  Dietary issues and exercise activities discussed: Current Exercise Habits: Home exercise routine, Type of exercise: stretching;walking, Time (Minutes): 25, Frequency (Times/Week): 3, Weekly Exercise (Minutes/Week): 75, Intensity: Mild, Exercise limited by: neurologic condition(s)   Goals Addressed   None    Depression Screen PHQ 2/9 Scores 08/28/2021 08/22/2021 07/30/2021 07/22/2021 07/09/2021 04/09/2021 05/15/2020  PHQ - 2 Score 0 0 0 0 0 2 0  PHQ- 9 Score 0 3 - 5 - - -    Fall Risk Fall Risk  08/28/2021 08/22/2021 07/30/2021 07/22/2021 07/09/2021  Falls in the past year? 0 0 0 0 0  Number falls in past yr: 0 0 0 0 -  Injury with Fall? 0 0 0  0 -  Risk for fall due to : - No Fall Risks - No Fall Risks -  Follow up Falls evaluation completed;Falls prevention discussed Falls evaluation completed - Falls evaluation completed -    FALL RISK PREVENTION PERTAINING TO THE HOME:  Any stairs in or around the home? Yes  If so, are there any without handrails? No  Home free of loose throw rugs in walkways, pet beds, electrical cords, etc? Yes  Adequate lighting in your home to reduce risk of falls? Yes   ASSISTIVE DEVICES UTILIZED TO PREVENT FALLS:  Life alert? No  Use of a cane, walker or w/c? Yes  Grab bars in the bathroom? Yes  Shower chair or bench in shower? Yes  Elevated toilet seat or a handicapped toilet? No   TIMED UP AND GO:  Was the test performed? No .    Cognitive Function:  Normal cognitive status assessed by direct observation by this Nurse Health Advisor. No abnormalities found.        Immunizations Immunization History  Administered Date(s) Administered   Influenza,inj,Quad PF,6+ Mos 05/04/2018, 03/31/2019, 07/22/2021   PFIZER(Purple Top)SARS-COV-2 Vaccination 11/13/2019, 12/12/2019   Pneumococcal Polysaccharide-23 03/16/2018    TDAP status: Due, Education has been provided regarding the importance of this vaccine. Advised may receive this vaccine at local pharmacy or Health Dept. Aware to provide a copy of the vaccination record if obtained from local pharmacy or Health Dept. Verbalized acceptance and understanding.  Flu Vaccine status: Up to date  Pneumococcal vaccine status: Up to date  Covid-19 vaccine status: Information provided on how to obtain vaccines.   Qualifies for Shingles Vaccine? No   Zostavax completed No   Shingrix Completed?: No.    Education has been provided regarding the importance of this vaccine. Patient has been advised to call insurance company to determine out of pocket expense if they have not yet received this vaccine. Advised may also receive vaccine at local pharmacy or  Health Dept. Verbalized acceptance and understanding.  Screening Tests Health Maintenance  Topic Date Due   Hepatitis C Screening  Never done   TETANUS/TDAP  Never done   COVID-19 Vaccine (3 - Booster  for [place] series) 02/06/2020   INFLUENZA VACCINE  Completed   HIV Screening  Completed   HPV VACCINES  Aged Out    Health Maintenance  Health Maintenance Due  Topic Date Due   Hepatitis C Screening  Never done   TETANUS/TDAP  Never done   COVID-19 Vaccine (3 - Booster for Pfizer series) 02/06/2020      Lung Cancer Screening: (Low Dose CT Chest recommended if Age 68-80 years, 30 pack-year currently smoking OR have quit w/in 15years.does not qualify.   Lung Cancer Screening Referral:   Additional Screening:  Hepatitis C Screening: does not qualify;  Vision Screening: Recommended annual ophthalmology exams for early detection of glaucoma and other disorders of the eye. Is the patient up to date with their annual eye exam?  Yes  Who is the provider or what is the name of the office in which the patient attends annual eye exams? Dr. Kerin Ransom If pt is not established with a provider, would they like to be referred to a provider to establish care? No .   Dental Screening: Recommended annual dental exams for proper oral hygiene  Community Resource Referral / Chronic Care Management: CRR required this visit?  No   CCM required this visit?  No      Plan:     I have personally reviewed and noted the following in the patients chart:   Medical and social history Use of alcohol, tobacco or illicit drugs  Current medications and supplements including opioid prescriptions. Patient is not currently taking opioid prescriptions. Functional ability and status Nutritional status Physical activity Advanced directives List of other physicians Hospitalizations, surgeries, and ER visits in previous 12 months Vitals Screenings to include cognitive, depression, and falls Referrals and  appointments  In addition, I have reviewed and discussed with patient certain preventive protocols, quality metrics, and best practice recommendations. A written personalized care plan for preventive services as well as general preventive health recommendations were provided to patient.     Leroy Kennedy, LPN   11/06/1913   Nurse Notes:

## 2021-09-03 ENCOUNTER — Other Ambulatory Visit: Payer: Self-pay | Admitting: Physical Medicine & Rehabilitation

## 2021-09-03 DIAGNOSIS — G8110 Spastic hemiplegia affecting unspecified side: Secondary | ICD-10-CM

## 2021-10-08 ENCOUNTER — Ambulatory Visit: Payer: Medicare HMO | Admitting: Physical Medicine & Rehabilitation

## 2021-10-22 DIAGNOSIS — H53461 Homonymous bilateral field defects, right side: Secondary | ICD-10-CM | POA: Diagnosis not present

## 2021-10-22 DIAGNOSIS — H3500 Unspecified background retinopathy: Secondary | ICD-10-CM | POA: Diagnosis not present

## 2021-10-22 DIAGNOSIS — D3131 Benign neoplasm of right choroid: Secondary | ICD-10-CM | POA: Diagnosis not present

## 2021-10-23 ENCOUNTER — Telehealth: Payer: Self-pay

## 2021-10-24 NOTE — Telephone Encounter (Signed)
Error

## 2021-10-29 ENCOUNTER — Other Ambulatory Visit: Payer: Self-pay | Admitting: Internal Medicine

## 2021-10-29 NOTE — Telephone Encounter (Signed)
Medication Refill - Medication:  ?atorvastatin (LIPITOR) 40 MG tablet ? ?Has the patient contacted their pharmacy? Yes.   ?Pharmacy called on behalf of pt ? ?Preferred Pharmacy (with phone number or street name):  ?[facility], [address] Phone:  [phone]  ?Fax:  [phone]  ?  ? ?Has the patient been seen for an appointment in the last year OR does the patient have an upcoming appointment? Yes.   ? ?Agent: Please be advised that RX refills may take up to 3 business days. We ask that you follow-up with your pharmacy. ?

## 2021-10-31 MED ORDER — ATORVASTATIN CALCIUM 40 MG PO TABS: 40.0000 mg | ORAL_TABLET | Freq: Every day | ORAL | 2 refills | Status: DC

## 2021-10-31 NOTE — Telephone Encounter (Signed)
Requested Prescriptions  ?Pending Prescriptions Disp Refills  ?? atorvastatin (LIPITOR) 40 MG tablet 30 tablet 3  ?  Sig: Take 1 tablet (40 mg total) by mouth daily.  ?  ? Cardiovascular:  Antilipid - Statins Failed - 10/30/2021 11:13 AM  ?  ?  Failed - Lipid Panel in normal range within the last 12 months  ?  Cholesterol, Total  ?Date Value Ref Range Status  ?07/23/2021 193 100 - 199 mg/dL Final  ? ?LDL Chol Calc (NIH)  ?Date Value Ref Range Status  ?07/23/2021 135 (H) 0 - 99 mg/dL Final  ? ?HDL  ?Date Value Ref Range Status  ?07/23/2021 38 (L) >39 mg/dL Final  ? ?Triglycerides  ?Date Value Ref Range Status  ?07/23/2021 107 0 - 149 mg/dL Final  ? ?  ?  ?  Passed - Patient is not pregnant  ?  ?  Passed - Valid encounter within last 12 months  ?  Recent Outpatient Visits   ?      ? 2 months ago Right hemiparesis ([place])  ? [facility] [name], Avanti, MD  ? 3 months ago Need for influenza vaccination  ? [facility] [name], Avanti, MD  ?  ?  ?Future Appointments   ?        ? In 3 months [name], Avanti, MD [facility]  ?  ? ?  ?  ?  ? ? ?

## 2021-11-05 ENCOUNTER — Encounter: Payer: Self-pay | Admitting: Physical Medicine & Rehabilitation

## 2021-11-05 ENCOUNTER — Telehealth: Payer: Self-pay | Admitting: Physical Medicine & Rehabilitation

## 2021-11-05 ENCOUNTER — Encounter: Payer: Medicare HMO | Attending: Physical Medicine & Rehabilitation | Admitting: Physical Medicine & Rehabilitation

## 2021-11-05 ENCOUNTER — Other Ambulatory Visit: Payer: Self-pay

## 2021-11-05 VITALS — BP 138/85 | HR 82 | Ht 69.0 in | Wt 269.0 lb

## 2021-11-05 DIAGNOSIS — G8110 Spastic hemiplegia affecting unspecified side: Secondary | ICD-10-CM

## 2021-11-05 DIAGNOSIS — G8111 Spastic hemiplegia affecting right dominant side: Secondary | ICD-10-CM

## 2021-11-05 NOTE — Progress Notes (Signed)
Dysport Injection for spasticity of upper extremity using needle EMG guidance ?Indication: Spastic hemiparesis affecting dominant side (HCC) ? ? ?Dilution: 500 Units/47m        Total Units Injected:  500 ?Indication: Severe spasticity which interferes with ADL,mobility and/or  hygiene and is unresponsive to medication management and other conservative care ?Informed consent was obtained after describing risks and benefits of the procedure with the patient. This includes bleeding, bruising, infection, excessive weakness, or medication side effects. A REMS form is on file and signed. ?  ?Right arm ?Needle: 573minjectable monopolar needle electrode ? ? ?  ?Number of units per muscle ?Pectoralis Major 0 units ?Pectoralis Minor 0 units ?Biceps 0 units ?Brachioradialis 0 units ?FCR 0 units ?FCU 0 units ?FDS 100 units ?FDP 100 units ?FPL 0 units ?Palmaris Longus 50 units ?Pronator Teres 150 units ?Pronator Quadratus 0 units ?Lumbricals 100 units ?All injections were done after obtaining appropriate EMG activity and after negative drawback for blood. The patient tolerated the procedure well. Post procedure instructions were given. Return in about 3 months (around 02/05/2022) for botox right upper 500 u. ?  ?

## 2021-11-05 NOTE — Telephone Encounter (Signed)
Xavier Villegas wanted me to let you know he is interested in vivistim to move forward with whatecver you needed to do ?

## 2021-11-05 NOTE — Patient Instructions (Signed)
PLEASE FEEL FREE TO CALL OUR OFFICE WITH ANY PROBLEMS OR QUESTIONS ([phone])      

## 2021-11-24 ENCOUNTER — Ambulatory Visit (INDEPENDENT_AMBULATORY_CARE_PROVIDER_SITE_OTHER): Payer: Medicare HMO | Admitting: Internal Medicine

## 2021-11-24 ENCOUNTER — Encounter: Payer: Self-pay | Admitting: Internal Medicine

## 2021-11-24 VITALS — BP 133/79 | HR 69 | Temp 98.5°F | Wt 268.8 lb

## 2021-11-24 DIAGNOSIS — E785 Hyperlipidemia, unspecified: Secondary | ICD-10-CM

## 2021-11-24 DIAGNOSIS — I639 Cerebral infarction, unspecified: Secondary | ICD-10-CM

## 2021-11-24 DIAGNOSIS — Z136 Encounter for screening for cardiovascular disorders: Secondary | ICD-10-CM | POA: Diagnosis not present

## 2021-11-24 DIAGNOSIS — R030 Elevated blood-pressure reading, without diagnosis of hypertension: Secondary | ICD-10-CM | POA: Diagnosis not present

## 2021-11-24 NOTE — Progress Notes (Signed)
? ?BP 133/79   Pulse 69   Temp 98.5 ?F (36.9 ?C) (Oral)   Wt 268 lb 12.8 oz (121.9 kg)   SpO2 95%   BMI 39.69 kg/m?   ? ?Subjective:  ? ? Patient ID: Xavier Villegas, male    DOB: 1981/06/27, 41 y.o.   MRN: [id number] ? ?Chief Complaint  ?Patient presents with  ?? Hypertension  ?  Pt's father states that the patient has been elevated BP readings recently at [facility]   ? ? ?HPI: ?Xavier Villegas is a 41 y.o. male ? ?Hypertension ?This is a chronic (stable now has had high hypertension - was high at rehab) problem. The current episode started more than 1 month ago (was 5993T systolic / 90 mm hg.). Pertinent negatives include no anxiety, blurred vision, chest pain, headaches, malaise/fatigue, neck pain, orthopnea, palpitations, peripheral edema, PND, shortness of breath or sweats.  ? ?Chief Complaint  ?Patient presents with  ?? Hypertension  ?  Pt's father states that the patient has been elevated BP readings recently at [facility]   ? ? ?Relevant past medical, surgical, family and social history reviewed and updated as indicated. Interim medical history since our last visit reviewed. ?Allergies and medications reviewed and updated. ? ?Review of Systems  ?Constitutional:  Negative for malaise/fatigue.  ?Eyes:  Negative for blurred vision.  ?Respiratory:  Negative for shortness of breath.   ?Cardiovascular:  Negative for chest pain, palpitations, orthopnea and PND.  ?Musculoskeletal:  Negative for neck pain.  ?Neurological:  Negative for headaches.  ? ?Per HPI unless specifically indicated above ? ?   ?Objective:  ?  ?BP 133/79   Pulse 69   Temp 98.5 ?F (36.9 ?C) (Oral)   Wt 268 lb 12.8 oz (121.9 kg)   SpO2 95%   BMI 39.69 kg/m?   ?Wt Readings from Last 3 Encounters:  ?11/24/21 268 lb 12.8 oz (121.9 kg)  ?11/05/21 269 lb (122 kg)  ?08/22/21 270 lb 9.6 oz (122.7 kg)  ?  ?Physical Exam ?Vitals and nursing note reviewed.  ?Constitutional:   ?   General: He is not in acute distress. ?    Appearance: Normal appearance. He is not ill-appearing or diaphoretic.  ?HENT:  ?   Head: Normocephalic and atraumatic.  ?   Right Ear: Tympanic membrane and external ear normal. There is no impacted cerumen.  ?   Left Ear: External ear normal.  ?   Nose: No congestion or rhinorrhea.  ?   Mouth/Throat:  ?   Pharynx: No oropharyngeal exudate or posterior oropharyngeal erythema.  ?Eyes:  ?   Conjunctiva/sclera: Conjunctivae normal.  ?   Pupils: Pupils are equal, round, and reactive to light.  ?Cardiovascular:  ?   Rate and Rhythm: Normal rate and regular rhythm.  ?   Heart sounds: No murmur heard. ?  No friction rub. No gallop.  ?Pulmonary:  ?   Effort: No respiratory distress.  ?   Breath sounds: No stridor. No wheezing or rhonchi.  ?Chest:  ?   Chest wall: No tenderness.  ?Abdominal:  ?   General: There is no distension.  ?   Palpations: There is no mass.  ?   Tenderness: There is no abdominal tenderness. There is no right CVA tenderness, left CVA tenderness, guarding or rebound.  ?   Hernia: No hernia is present.  ?Musculoskeletal:  ?   Cervical back: Normal range of motion and neck supple. No rigidity or tenderness.  ?  Left lower leg: No edema.  ?Skin: ?   General: Skin is warm and dry.  ?Neurological:  ?   Mental Status: He is alert.  ?Psychiatric:     ?   Mood and Affect: Mood normal.  ? ? ?Results for orders placed or performed in visit on 07/22/21  ?CBC with Differential/Platelet  ?Result Value Ref Range  ? WBC 11.1 (H) 3.4 - 10.8 x10E3/uL  ? RBC 5.74 4.14 - 5.80 x10E6/uL  ? Hemoglobin 18.1 (H) 13.0 - 17.7 g/dL  ? Hematocrit 53.2 (H) 37.5 - 51.0 %  ? MCV 93 79 - 97 fL  ? MCH 31.5 26.6 - 33.0 pg  ? MCHC 34.0 31.5 - 35.7 g/dL  ? RDW 13.3 11.6 - 15.4 %  ? Platelets 250 150 - 450 x10E3/uL  ? Neutrophils 68 Not Estab. %  ? Lymphs 26 Not Estab. %  ? Monocytes 5 Not Estab. %  ? Eos 1 Not Estab. %  ? Basos 0 Not Estab. %  ? Neutrophils Absolute 7.4 (H) 1.4 - 7.0 x10E3/uL  ? Lymphocytes Absolute 2.9 0.7 - 3.1  x10E3/uL  ? Monocytes Absolute 0.6 0.1 - 0.9 x10E3/uL  ? EOS (ABSOLUTE) 0.1 0.0 - 0.4 x10E3/uL  ? Basophils Absolute 0.0 0.0 - 0.2 x10E3/uL  ? Immature Granulocytes 0 Not Estab. %  ? Immature Grans (Abs) 0.0 0.0 - 0.1 x10E3/uL  ?Comprehensive metabolic panel  ?Result Value Ref Range  ? Glucose 91 70 - 99 mg/dL  ? BUN 9 6 - 20 mg/dL  ? Creatinine, Ser 1.01 0.76 - 1.27 mg/dL  ? eGFR 97 >59 mL/min/1.73  ? BUN/Creatinine Ratio 9 9 - 20  ? Sodium 139 134 - 144 mmol/L  ? Potassium 4.1 3.5 - 5.2 mmol/L  ? Chloride 96 96 - 106 mmol/L  ? CO2 23 20 - 29 mmol/L  ? Calcium 9.8 8.7 - 10.2 mg/dL  ? Total Protein 7.9 6.0 - 8.5 g/dL  ? Albumin 4.7 4.0 - 5.0 g/dL  ? Globulin, Total 3.2 1.5 - 4.5 g/dL  ? Albumin/Globulin Ratio 1.5 1.2 - 2.2  ? Bilirubin Total 0.8 0.0 - 1.2 mg/dL  ? Alkaline Phosphatase 137 (H) 44 - 121 IU/L  ? AST 20 0 - 40 IU/L  ? ALT 39 0 - 44 IU/L  ?TSH  ?Result Value Ref Range  ? TSH 2.200 0.450 - 4.500 uIU/mL  ?Urinalysis, Routine w reflex microscopic  ?Result Value Ref Range  ? Specific Gravity, UA 1.015 1.005 - 1.030  ? pH, UA 5.5 5.0 - 7.5  ? Color, UA Yellow Yellow  ? Appearance Ur Clear Clear  ? Leukocytes,UA Negative Negative  ? Protein,UA Negative Negative/Trace  ? Glucose, UA Negative Negative  ? Ketones, UA Negative Negative  ? RBC, UA Negative Negative  ? Bilirubin, UA Negative Negative  ? Urobilinogen, Ur 0.2 0.2 - 1.0 mg/dL  ? Nitrite, UA Negative Negative  ? Microscopic Examination Comment   ?Lipid panel  ?Result Value Ref Range  ? Cholesterol, Total 193 100 - 199 mg/dL  ? Triglycerides 107 0 - 149 mg/dL  ? HDL 38 (L) >39 mg/dL  ? VLDL Cholesterol Cal 20 5 - 40 mg/dL  ? LDL Chol Calc (NIH) 135 (H) 0 - 99 mg/dL  ? Chol/HDL Ratio 5.1 (H) 0.0 - 5.0 ratio  ? ?   ? ? ?Current Outpatient Medications:  ??  aspirin 325 MG tablet, Take 1 tablet (325 mg total) by mouth daily., Disp: , Rfl:  ??  atorvastatin (LIPITOR) 40 MG tablet, Take 1 tablet (40 mg total) by mouth daily., Disp: 90 tablet, Rfl: 2 ??   baclofen (LIORESAL) 20 MG tablet, TAKE 1 TABLET (20 MG) BY MOUTH 4 (FOUR) TIMES DAILY., Disp: 360 tablet, Rfl: 3 ??  cholecalciferol (VITAMIN D3) 25 MCG (1000 UT) tablet, Take 1,000 Units by mouth daily., Disp: , Rfl:  ??  cyanocobalamin 1000 MCG tablet, Take 1 tablet (1,000 mcg total) by mouth daily., Disp: 30 tablet, Rfl: 2 ??  folic acid (FOLVITE) 1 MG tablet, TAKE 1 TABLET FOUR TIMES DAILY, Disp: 360 tablet, Rfl: 3 ??  levETIRAcetam (KEPPRA) 750 MG tablet, TAKE 1 TABLET (750 MG) BY MOUTH 2 (TWO) TIMES DAILY., Disp: 180 tablet, Rfl: 3 ??  OVER THE COUNTER MEDICATION, Take 2 capsules by mouth See admin instructions. Psyllium fiber 2 capsules by mouth everyday, Disp: , Rfl:  ??  pantoprazole (PROTONIX) 40 MG tablet, Take 40 mg by mouth daily as needed (for GERD). , Disp: , Rfl:  ??  pyridOXINE (B-6) 50 MG tablet, Take 1 tablet (50 mg total) by mouth daily., Disp: 30 tablet, Rfl: 2  ? ? ?Assessment & Plan:  ?Prehypertension? Sec to obesity  ?Lifestyle modifications advised to pt.  ? Portion control and avoiding high carb low fat diet advised.  Diet plan given to pt   exercise plan given and encouraged.  To increase exercise to 150 mins a week ie 21/2 hours a week. Pt verbalises understanding of the above.  ?Body mass index is 39.69 kg/m?. ? ?HLD :  ?recheck FLP, check LFT's work on diet, SE of meds explained to pt. low fat and high fiber diet explained to pt. ? ? ?Problem List Items Addressed This Visit   ? ?  ? Cardiovascular and Mediastinum  ? CVA (cerebral vascular accident) [facility])  ?  ? Other  ? Prehypertension - Primary  ? Relevant Orders  ? Lipid panel  ? CBC with Differential/Platelet  ? Comprehensive metabolic panel  ? Hyperlipidemia  ?  ? ?Orders Placed This Encounter  ?Procedures  ?? Lipid panel  ?? CBC with Differential/Platelet  ?? Comprehensive metabolic panel  ?  ? ?No orders of the defined types were placed in this encounter. ?  ? ?Follow up plan: ?Return in about 2 months (around 01/24/2022). ? ? ?

## 2021-11-25 LAB — LIPID PANEL
Chol/HDL Ratio: 2.6 ratio (ref 0.0–5.0)
Cholesterol, Total: 104 mg/dL (ref 100–199)
HDL: 40 mg/dL (ref >39)
LDL Chol Calc (NIH): 48 mg/dL (ref 0–99)
Triglycerides: 79 mg/dL (ref 0–149)
VLDL Cholesterol Cal: 16 mg/dL (ref 5–40)

## 2021-11-25 LAB — COMPREHENSIVE METABOLIC PANEL
ALT: 37 IU/L (ref 0–44)
AST: 22 IU/L (ref 0–40)
Albumin/Globulin Ratio: 1.5 (ref 1.2–2.2)
Albumin: 4.2 g/dL (ref 4.0–5.0)
Alkaline Phosphatase: 148 IU/L — ABNORMAL HIGH (ref 44–121)
BUN/Creatinine Ratio: 8 — ABNORMAL LOW (ref 9–20)
BUN: 7 mg/dL (ref 6–20)
Bilirubin Total: 0.8 mg/dL (ref 0.0–1.2)
CO2: 25 mmol/L (ref 20–29)
Calcium: 9.5 mg/dL (ref 8.7–10.2)
Chloride: 103 mmol/L (ref 96–106)
Creatinine, Ser: 0.88 mg/dL (ref 0.76–1.27)
Globulin, Total: 2.8 g/dL (ref 1.5–4.5)
Glucose: 90 mg/dL (ref 70–99)
Potassium: 3.8 mmol/L (ref 3.5–5.2)
Sodium: 145 mmol/L — ABNORMAL HIGH (ref 134–144)
Total Protein: 7 g/dL (ref 6.0–8.5)
eGFR: 112 mL/min/{1.73_m2} (ref >59)

## 2021-11-25 LAB — CBC WITH DIFFERENTIAL/PLATELET
Basophils Absolute: 0 10*3/uL (ref 0.0–0.2)
Basos: 0 %
EOS (ABSOLUTE): 0.1 10*3/uL (ref 0.0–0.4)
Eos: 1 %
Hematocrit: 48.7 % (ref 37.5–51.0)
Hemoglobin: 16.9 g/dL (ref 13.0–17.7)
Immature Grans (Abs): 0 10*3/uL (ref 0.0–0.1)
Immature Granulocytes: 0 %
Lymphocytes Absolute: 3 10*3/uL (ref 0.7–3.1)
Lymphs: 30 %
MCH: 32.5 pg (ref 26.6–33.0)
MCHC: 34.7 g/dL (ref 31.5–35.7)
MCV: 94 fL (ref 79–97)
Monocytes Absolute: 0.5 10*3/uL (ref 0.1–0.9)
Monocytes: 5 %
Neutrophils Absolute: 6.5 10*3/uL (ref 1.4–7.0)
Neutrophils: 64 %
Platelets: 240 10*3/uL (ref 150–450)
RBC: 5.2 x10E6/uL (ref 4.14–5.80)
RDW: 12.8 % (ref 11.6–15.4)
WBC: 10.2 10*3/uL (ref 3.4–10.8)

## 2021-12-04 NOTE — Telephone Encounter (Signed)
Patient calling back about getting this referral sent.  Please advise. ?

## 2021-12-05 NOTE — Addendum Note (Signed)
Addended by: Alger Simons T on: 12/05/2021 01:05 PM ? ? Modules accepted: Orders ? ?

## 2021-12-05 NOTE — Telephone Encounter (Signed)
Notified of process. ?

## 2021-12-05 NOTE — Telephone Encounter (Signed)
Referral was made to OT for vivistim. Just a reminder that he has to QUALIFY first (through this eval) to proceed with further assessments for placement of device.  ? ?thx ?

## 2021-12-17 ENCOUNTER — Ambulatory Visit: Payer: Medicare HMO | Admitting: Occupational Therapy

## 2022-01-08 ENCOUNTER — Encounter: Payer: Self-pay | Admitting: Internal Medicine

## 2022-01-08 ENCOUNTER — Ambulatory Visit (INDEPENDENT_AMBULATORY_CARE_PROVIDER_SITE_OTHER): Payer: Medicare HMO | Admitting: Internal Medicine

## 2022-01-08 VITALS — BP 117/83 | HR 101 | Temp 97.9°F | Ht 69.02 in | Wt 267.4 lb

## 2022-01-08 DIAGNOSIS — G8191 Hemiplegia, unspecified affecting right dominant side: Secondary | ICD-10-CM | POA: Diagnosis not present

## 2022-01-08 DIAGNOSIS — E785 Hyperlipidemia, unspecified: Secondary | ICD-10-CM | POA: Diagnosis not present

## 2022-01-08 DIAGNOSIS — R030 Elevated blood-pressure reading, without diagnosis of hypertension: Secondary | ICD-10-CM

## 2022-01-08 NOTE — Progress Notes (Signed)
BP 117/83   Pulse (!) 101   Temp 97.9 F (36.6 C) (Oral)   Ht 5' 9.02" (1.753 m)   Wt 267 lb 6.4 oz (121.3 kg)   SpO2 97%   BMI 39.47 kg/m    Subjective:    Patient ID: Xavier Villegas, male    DOB: Apr 25, 1981, 41 y.o.   MRN: [id number]  Chief Complaint  Patient presents with  . Hypertension    Here for follow up, patient has been keeping track of BP readings since 11/27/21    HPI: Xavier Villegas is a 41 y.o. male  Hypertension This is a chronic problem. The current episode started more than 1 year ago. The problem is controlled. Pertinent negatives include no anxiety, blurred vision, chest pain, headaches, malaise/fatigue, neck pain, orthopnea, palpitations, peripheral edema, PND, shortness of breath or sweats.   Chief Complaint  Patient presents with  . Hypertension    Here for follow up, patient has been keeping track of BP readings since 11/27/21    Relevant past medical, surgical, family and social history reviewed and updated as indicated. Interim medical history since our last visit reviewed. Allergies and medications reviewed and updated.  Review of Systems  Constitutional:  Negative for malaise/fatigue.  Eyes:  Negative for blurred vision.  Respiratory:  Negative for shortness of breath.   Cardiovascular:  Negative for chest pain, palpitations, orthopnea and PND.  Musculoskeletal:  Negative for neck pain.  Neurological:  Negative for headaches.   Per HPI unless specifically indicated above     Objective:    BP 117/83   Pulse (!) 101   Temp 97.9 F (36.6 C) (Oral)   Ht 5' 9.02" (1.753 m)   Wt 267 lb 6.4 oz (121.3 kg)   SpO2 97%   BMI 39.47 kg/m   Wt Readings from Last 3 Encounters:  01/08/22 267 lb 6.4 oz (121.3 kg)  11/24/21 268 lb 12.8 oz (121.9 kg)  11/05/21 269 lb (122 kg)    Physical Exam Vitals and nursing note reviewed.  Constitutional:      General: He is not in acute distress.    Appearance: Normal appearance. He is  not ill-appearing or diaphoretic.  HENT:     Head: Normocephalic and atraumatic.     Right Ear: Tympanic membrane and external ear normal. There is no impacted cerumen.     Left Ear: External ear normal.     Nose: No congestion or rhinorrhea.     Mouth/Throat:     Pharynx: No oropharyngeal exudate or posterior oropharyngeal erythema.  Eyes:     Conjunctiva/sclera: Conjunctivae normal.     Pupils: Pupils are equal, round, and reactive to light.  Cardiovascular:     Rate and Rhythm: Normal rate and regular rhythm.     Heart sounds: No murmur heard. Pulmonary:     Effort: No respiratory distress.     Breath sounds: No stridor. No rhonchi.  Chest:     Chest wall: No tenderness.  Abdominal:     General: Bowel sounds are normal.     Palpations: Abdomen is soft. There is no mass.     Tenderness: There is no abdominal tenderness.  Musculoskeletal:     Cervical back: Normal range of motion and neck supple. No rigidity or tenderness.     Left lower leg: No edema.  Neurological:     Mental Status: He is alert.   Results for orders placed or performed in visit on 11/24/21  CBC  with Differential/Platelet  Result Value Ref Range   WBC 10.2 3.4 - 10.8 x10E3/uL   RBC 5.20 4.14 - 5.80 x10E6/uL   Hemoglobin 16.9 13.0 - 17.7 g/dL   Hematocrit 48.7 37.5 - 51.0 %   MCV 94 79 - 97 fL   MCH 32.5 26.6 - 33.0 pg   MCHC 34.7 31.5 - 35.7 g/dL   RDW 12.8 11.6 - 15.4 %   Platelets 240 150 - 450 x10E3/uL   Neutrophils 64 Not Estab. %   Lymphs 30 Not Estab. %   Monocytes 5 Not Estab. %   Eos 1 Not Estab. %   Basos 0 Not Estab. %   Neutrophils Absolute 6.5 1.4 - 7.0 x10E3/uL   Lymphocytes Absolute 3.0 0.7 - 3.1 x10E3/uL   Monocytes Absolute 0.5 0.1 - 0.9 x10E3/uL   EOS (ABSOLUTE) 0.1 0.0 - 0.4 x10E3/uL   Basophils Absolute 0.0 0.0 - 0.2 x10E3/uL   Immature Granulocytes 0 Not Estab. %   Immature Grans (Abs) 0.0 0.0 - 0.1 x10E3/uL  Comprehensive metabolic panel  Result Value Ref Range   Glucose  90 70 - 99 mg/dL   BUN 7 6 - 20 mg/dL   Creatinine, Ser 0.88 0.76 - 1.27 mg/dL   eGFR 112 >59 mL/min/1.73   BUN/Creatinine Ratio 8 (L) 9 - 20   Sodium 145 (H) 134 - 144 mmol/L   Potassium 3.8 3.5 - 5.2 mmol/L   Chloride 103 96 - 106 mmol/L   CO2 25 20 - 29 mmol/L   Calcium 9.5 8.7 - 10.2 mg/dL   Total Protein 7.0 6.0 - 8.5 g/dL   Albumin 4.2 4.0 - 5.0 g/dL   Globulin, Total 2.8 1.5 - 4.5 g/dL   Albumin/Globulin Ratio 1.5 1.2 - 2.2   Bilirubin Total 0.8 0.0 - 1.2 mg/dL   Alkaline Phosphatase 148 (H) 44 - 121 IU/L   AST 22 0 - 40 IU/L   ALT 37 0 - 44 IU/L  Lipid panel  Result Value Ref Range   Cholesterol, Total 104 100 - 199 mg/dL   Triglycerides 79 0 - 149 mg/dL   HDL 40 >39 mg/dL   VLDL Cholesterol Cal 16 5 - 40 mg/dL   LDL Chol Calc (NIH) 48 0 - 99 mg/dL   Chol/HDL Ratio 2.6 0.0 - 5.0 ratio        Current Outpatient Medications:  .  aspirin 325 MG tablet, Take 1 tablet (325 mg total) by mouth daily., Disp: , Rfl:  .  atorvastatin (LIPITOR) 40 MG tablet, Take 1 tablet (40 mg total) by mouth daily., Disp: 90 tablet, Rfl: 2 .  baclofen (LIORESAL) 20 MG tablet, TAKE 1 TABLET (20 MG) BY MOUTH 4 (FOUR) TIMES DAILY., Disp: 360 tablet, Rfl: 3 .  cholecalciferol (VITAMIN D3) 25 MCG (1000 UT) tablet, Take 1,000 Units by mouth daily., Disp: , Rfl:  .  cyanocobalamin 1000 MCG tablet, Take 1 tablet (1,000 mcg total) by mouth daily., Disp: 30 tablet, Rfl: 2 .  folic acid (FOLVITE) 1 MG tablet, TAKE 1 TABLET FOUR TIMES DAILY, Disp: 360 tablet, Rfl: 3 .  levETIRAcetam (KEPPRA) 750 MG tablet, TAKE 1 TABLET (750 MG) BY MOUTH 2 (TWO) TIMES DAILY., Disp: 180 tablet, Rfl: 3 .  OVER THE COUNTER MEDICATION, Take 2 capsules by mouth See admin instructions. Psyllium fiber 2 capsules by mouth everyday, Disp: , Rfl:  .  pantoprazole (PROTONIX) 40 MG tablet, Take 40 mg by mouth daily as needed (for GERD). , Disp: , Rfl:  .  pyridOXINE (B-6) 50 MG tablet, Take 1 tablet (50 mg total) by mouth daily.,  Disp: 30 tablet, Rfl: 2    Assessment & Plan:  Weight loss of about 5 lbs since last visit Stopped eating cookies, cakes. Weight 267--- 262 today.  Lifestyle modifications advised to pt. A1c at   Portion control and avoiding high carb low fat diet advised.  Diet plan given to pt   exercise plan given and encouraged.  To increase exercise to 150 mins a week ie 21/2 hours a week. Pt verbalises understanding of the above.  Bmi at 39.47   Prehypertension BP much improced from home logs. HTN :  Continue current meds.  Medication compliance emphasised. pt advised to keep Bp logs. Pt verbalised understanding of the same. Pt to have a low salt diet . Exercise to reach a goal of at least 150 mins a week.  lifestyle modifications explained and pt understands importance of the above. Under good control on current regimen. Continue current regimen. Continue to monitor. Call with any concerns. Refills given. Labs drawn today.  CVA  chronic, stable.    Problem List Items Addressed This Visit       Nervous and Auditory   Right hemiparesis ([place])     Other   Prehypertension - Primary   Hyperlipidemia     No orders of the defined types were placed in this encounter.    No orders of the defined types were placed in this encounter.    Follow up plan: Return in about 3 months (around 04/10/2022).

## 2022-01-08 NOTE — Patient Instructions (Signed)
3

## 2022-02-11 ENCOUNTER — Encounter: Payer: Medicare HMO | Attending: Physical Medicine & Rehabilitation | Admitting: Physical Medicine & Rehabilitation

## 2022-02-11 ENCOUNTER — Encounter: Payer: Self-pay | Admitting: Physical Medicine & Rehabilitation

## 2022-02-11 VITALS — BP 142/94 | HR 83 | Ht 69.02 in | Wt 266.2 lb

## 2022-02-11 DIAGNOSIS — G8110 Spastic hemiplegia affecting unspecified side: Secondary | ICD-10-CM | POA: Diagnosis not present

## 2022-02-11 NOTE — Progress Notes (Signed)
Dysport Injection for spasticity using needle EMG guidance Indication:  Spastic hemiparesis affecting dominant side (HCC)   Dilution: 500 Units/39m        Total Units Injected:  800 Indication: Severe spasticity which interferes with ADL,mobility and/or  hygiene and is unresponsive to medication management and other conservative care Informed consent was obtained after describing risks and benefits of the procedure with the patient. This includes bleeding, bruising, infection, excessive weakness, or medication side effects. A REMS form is on file and signed.  RIGHT Needle: 554minjectable monopolar needle electrode  Number of units per muscle Pectoralis Major 0 units Pectoralis Minor 0 units Biceps 0 units Brachioradialis 0 units FCR 50 units FCU 50 units FDS 200 units FDP 200 units FPL 0 units Pronator Teres 100 units Pronator Quadratus 50 units Lumbricals 100 units FPB/ 50 units  All injections were done after obtaining appropriate EMG activity and after negative drawback for blood. The patient tolerated the procedure well. Post procedure instructions were given.

## 2022-02-11 NOTE — Patient Instructions (Signed)
PLEASE FEEL FREE TO CALL OUR OFFICE WITH ANY PROBLEMS OR QUESTIONS ([phone])      

## 2022-02-19 ENCOUNTER — Ambulatory Visit: Payer: Medicare HMO | Admitting: Physician Assistant

## 2022-02-23 ENCOUNTER — Encounter: Payer: Self-pay | Admitting: Physician Assistant

## 2022-02-23 ENCOUNTER — Ambulatory Visit (INDEPENDENT_AMBULATORY_CARE_PROVIDER_SITE_OTHER): Payer: Medicare HMO | Admitting: Physician Assistant

## 2022-02-23 VITALS — BP 137/77 | HR 65 | Temp 97.6°F | Wt 266.6 lb

## 2022-02-23 DIAGNOSIS — R030 Elevated blood-pressure reading, without diagnosis of hypertension: Secondary | ICD-10-CM

## 2022-02-23 DIAGNOSIS — E669 Obesity, unspecified: Secondary | ICD-10-CM

## 2022-02-23 NOTE — Assessment & Plan Note (Signed)
Unsure of chronicity but appears to be ongoing Reviewed BP recommendations  Would prefer for him to try increasing adherence to lifestyle modifications and will review at-home BP readings in one month before adding antihypertensive agent Follow up in 1 month with BP log from home

## 2022-02-23 NOTE — Progress Notes (Unsigned)
[facility] [address] [place]. [place] [address] [phone]       OFFICE FOLLOW UP NOTE  Xavier. Xavier Villegas Date of Birth:  January 18, 1981 Medical Record Number:  [id number]   Reason for visit: stroke and seizure follow up  CHIEF COMPLAINT:  No chief complaint on file.    HPI:  Update 02/23/2022 JM: Patient returns for yearly stroke and seizure follow-up.  Overall stable since prior visit.  Reports stable residual right spastic hemiparesis, expressive aphasia and visual impairment.  Continues to follow with Dr. Naaman Plummer for botox.  Denies new stroke/TIA symptoms.  No recent seizure activity.  Compliant on Keppra, denies side effects.  Compliant on aspirin, denies side effects.  Blood pressure today ***.  No new concerns at this time.       History provided for reference purposes only Update 02/27/2021 JM: Xavier Villegas returns for yearly stroke and seizure follow-up accompanied by his mother and father.  He has been stable since prior visit without new stroke/TIA symptoms or seizure activity.  Residual stroke deficits stable without worsening.  Continues to receive Botox by Dr. Naaman Plummer [facility] for right spastic hemiparesis with last injection on 5/4.  He has also been working with [facility] in [place], [place] with noted improvement of strength and spasticity.  Per father, his speech is also been improving at PT works with him on language exercises as well.  Compliant on aspirin without associated side effects.  Compliant on Keppra 750 mg twice daily tolerating without side effects.  Blood pressure today 146/88.  No new concerns at this time.  Update 02/27/2020 JM: Xavier Villegas returns for 55-monthfollow-up accompanied by his father.  Stable residual deficits of right spastic hemiparesis, expressive aphasia and visual impairment.  Denies new stroke/TIA symptoms.  Continues to receive Botox injections by PMR for spasticity.  No recurrent seizure activity and continues on  Keppra 750 mg twice daily tolerating well without side effects.  Continues on aspirin 325 mg daily without bleeding or bruising.  Blood pressure today 133/82.  Remains on folic acid, BN62and B6 for management of hyper homocystinemia.  Recent lab work satisfactory.  No concerns at this time.  Update 08/29/2019: Xavier Villegas a 41year old male who is being seen today for stroke and seizure follow-up.  He has been stable from a neurological standpoint with residual stroke deficits of right spastic hemiparesis, severe expressive aphasia and right homonymous hemianopia.  He was previously working with PT but this has been placed on hold back in 05/2019 due to extensive dental procedure.  He continues to do exercises at home which were recommended by therapy as well as father helping with range of motion to help with spasticity.  [facility] this also has been placed on hold due to financial and insurance reasons.  His mother does endorse ongoing improvement of right hemiparesis with improvement of gait and at times is able to ambulate without AFO brace as well as increased strength of RUE.  He has not had any reoccurring seizure activity and continues on Keppra 750 mg twice daily tolerating well without side effects.  Continues on aspirin 325 mg daily without bleeding or bruising for secondary stroke prevention.  Blood pressure today 130/88.  He was also continued on B12, B6 and folate which was initiated during hospitalization due to evidence of hyperhomocystinemia, BX52deficiency folic acid deficiency.  Repeat labs have not been performed since hospitalization.  No  further concerns at this time.  Update 02/23/2019: Xavier Villegas is a 41 year old male who is being seen today for follow-up regarding seizure activity on 10/15/2018 likely secondary to prior left MCA infarct.  He did undergo EEG on 10/31/2018 which did not show evidence of seizure activity.  He denies any  recurrent seizure activity and continues on Keppra 750 mg twice daily without reported side effects. Underwent repeat carotid ultrasound on 10/27/2018 which showed bilateral ICA stenosis of 1 to 39% and VAs antegrade flow.  Residual stroke deficits right spastic hemiparesis, severe expressive aphasia and right homonymous hemianopsia stable. Continues to work with therapy thru [employer].  He was previously working with speech therapy at [facility] but has since been completed and is currently attempting to obtain speech machine.  Insurance will start paying for outpatient therapy again after 03/11/19. Blood pressure 132/91 which is normal for patient. Continues on aspirin '325mg'$  daily without bleeding or bruising. No further concerns at this time.   10/25/2018 visit: Xavier Villegas is a 41 year old male who is being seen today for follow-up visit after admission to ED on 10/15/2018 after witnessed seizure activity with history of left MCA infarct with small ICH/SDH hemorrhagic conversion s/p craniotomy.  Per ED notes, consisted of "moving his right arm, sticking his tongue out and convulsing for approximately 2 to 3 minutes along with incontinence episode per mother".  Reported postictal state per mother.  He ultimately had recurrent seizure during admission lasting for less than 2 minutes.  CT head reviewed and was negative for acute intracranial abnormality and showed stable chronic large region of encephalomalacia in the left MCA territory with small chronic left frontal extra-axial collection deep to the left craniotomy. initiated Keppra 750 mg twice daily at discharge for seizure prophylaxis.  He denies any recurrent seizure activity since this time.  He has tolerated Keppra without reported side effects.  He continues to have residual stroke deficits of right spastic hemiparesis, severe expressive aphasia and right homonymous hemianopia.  No further concerns at this time.  He does continue to take aspirin  325 mg without reported side effects of bleeding or bruising.  Blood pressure today satisfactory at 128/81.  He continues to live at home but is able to maintain ADLs independently.  He was previously participating at [employer] for physical and speech therapy as his insurance will not approve additional therapy sessions at this time.  Unfortunately, due to recent pandemic, these colleges have close at this time but he plans on restarting once able.  He returns today for reevaluation and follow-up regarding recent seizure activity.   History 08/24/2018: Patient is being seen today for 8-monthfollow-up visit and is accompanied by his mother.  He has been doing well since prior visit with continued post stroke deficits of right spastic hemiparesis, severe expressive aphasia and right homonymous hemianopia. He continues to work with PT/OT at our neuro rehab clinic and does endorse some improvement of his hemiparesis.  He currently is ambulating with a cane but typically will not use the cane in his home unless he is going up steps.  He is currently on baclofen to assist with spasticity and he does endorse benefit from this.  He is unable to participate in further ST sessions due to insurance reasons and plans on looking into volunteering at a speech therapy school in hopes of obtaining additional ST.  He does follow with ophthalmology for peripheral vision exams and per mother, these have been stable.  He continues  on aspirin 325 mg without side effects of bleeding or bruising.  Blood pressure today satisfactory 130/85.  No further concerns at this time.  Denies new or worsening stroke/TIA symptoms.  05/17/2018 visit: Patient is being seen today for hospital follow up and is accompanied by his aunt. Underwent re-implantation of cranial flap on 05/02/18 which was tolerated well without complication.  He continues to have right hemiparesis with spasticity and expressive aphasia but does feel as though all have been  improving.  He participates at our neuro rehab clinic where he receives PT/OT/ST.  He is able to ambulate with quad cane and use of foot drop brace but denies any recent falls.  He is able to say a few words but for the most part nods head appropriately.  He also continues to have right homonymous hemianopia and is unsure if there has been any improvement.  He was discharged from hospital stay on aspirin 325 and has continued to take this without side effects of bleeding or bruising.  Blood pressure today satisfactory at 125/80.  He currently is living with his father for continued assistance but is able to bathe and dress himself independently.  Patient appears to be in good spirits regarding recovery progress and the future.  Denies any depression-like symptoms or anxiety.  No further concerns at this time.  Denies new or worsening stroke/TIA symptoms.  03/01/2018 hospital admission: Xavier Villegas is a 41 y.o. male with no significant past medical history other than tobacco use who presented with right-sided weakness and aphasia. He did not receive IV t-PA due to late presentation.  CT head reviewed and showed large territory acute infarct left MCA territory.  Due to these findings, he was taken to MRI for consideration of IR if his infarct burden appears less than estimated by CTP but unfortunately due to other patient care issues there would have been a significant delay therefore patient was taken to IR for intervention.  Patient underwent attempted thrombectomy for occlusive left ICA which resulted in left ICA dissection and possible ICH.  Postprocedure CT showed subarachnoid hemorrhage with hemorrhagic transformation.  MRI head reviewed and showed large left MCA infarct with small ICH/SDH hemorrhagic conversion.  MRA head showed left ICA and MCA occlusion.  CTA head and neck showed acute dissection left internal carotid artery with intraluminal thrombus along with 2 mm midline shift.  Repeat  CT showed progressive cytotoxic edema with slight mass-effect and midline shift where he required left decompressive hemicraniotomy as well as hypertonic saline for management of edema. Repeat CT scan showed improvement after treatment. Lower extremity venous Dopplers were negative for DVT.  Carotid Doppler showed left ICA occlusion.  2D echo showed an EF of 55 to 60% with aortic valve mass versus vegetation.  TEE was normal without evidence of vegetation.  Hypercoagulable work-up did show high homocystine at 181.7 and he was started on high-dose folate and B6 as well as B12 injection.  LDL 46 and A1c 4.9.  Patient was discharged to [facility] for continuation of therapy in stable condition. Patient was discharged home on 04/06/18 with 24 hr supervision.       ROS:   14 system review of systems performed and negative with exception of see HPI    PMH:  Past Medical History:  Diagnosis Date   Aphasia    GERD (gastroesophageal reflux disease)    Hemiparesis ([place]) 02/2018   right side   Hyperhomocysteinemia (HCC)    Neurogenic bladder 02/2018  04/26/18 inporving   Spastic hemiparesis (HCC)    right side   Stroke (HCC)    Left MCA infart   Tobacco abuse     PSH:  Past Surgical History:  Procedure Laterality Date   CRANIOTOMY Left 02/21/2018   Procedure: DECOMPRESSIVE CRANIECTOMY with bone flap to abdomen;  Surgeon: [name], MD;  Location: [place];  Service: Neurosurgery;  Laterality: Left;  DECOMPRESSIVE CRANIECTOMY with bone flap to abdomen   CRANIOTOMY N/A 05/02/2018   Procedure: RE-IMPLANTATION OF CRANIAL FLAP;  Surgeon: [name], MD;  Location: [place];  Service: Neurosurgery;  Laterality: N/A;  RE-IMPLANTATION OF CRANIAL FLAP   IR ANGIO INTRA EXTRACRAN SEL COM CAROTID INNOMINATE UNI R MOD SED  02/19/2018   IR ANGIO VERTEBRAL SEL VERTEBRAL BILAT MOD SED  02/19/2018   IR CT HEAD LTD  02/19/2018   IR PERCUTANEOUS ART THROMBECTOMY/INFUSION INTRACRANIAL INC DIAG ANGIO  02/19/2018   IR US GUIDE  VASC ACCESS RIGHT  02/19/2018   RADIOLOGY WITH ANESTHESIA N/A 02/19/2018   Procedure: IR WITH ANESTHESIA CODE STROKE;  Surgeon: Corrie Mckusick, DO;  Location: [place];  Service: Anesthesiology;  Laterality: N/A;   TEE WITHOUT CARDIOVERSION N/A 03/01/2018   Procedure: TRANSESOPHAGEAL ECHOCARDIOGRAM (TEE);  Surgeon: Sanda Klein, MD;  Location[facility] ENDOSCOPY;  Service: Cardiovascular;  Laterality: N/A;    Social History:  Social History   Socioeconomic History   Marital status: Divorced    Spouse name: Not on file   Number of children: Not on file   Years of education: Not on file   Highest education level: Not on file  Occupational History   Not on file  Tobacco Use   Smoking status: Former    Packs/day: 0.50    Years: 8.00    Total pack years: 4.00    Types: Cigarettes    Quit date: 02/19/2018    Years since quitting: 4.0   Smokeless tobacco: Never  Vaping Use   Vaping Use: Never used  Substance and Sexual Activity   Alcohol use: Not Currently    Alcohol/week: 14.0 standard drinks of alcohol    Types: 14 Cans of beer per week   Drug use: Never   Sexual activity: Yes  Other Topics Concern   Not on file  Social History Narrative   Not on file   Social Determinants of Health   Financial Resource Strain: Low Risk  (08/28/2021)   Overall Financial Resource Strain (CARDIA)    Difficulty of Paying Living Expenses: Not very hard  Food Insecurity: Not on file  Transportation Needs: Not on file  Physical Activity: Not on file  Stress: No Stress Concern Present (08/28/2021)   [place]    Feeling of Stress : Only a little  Social Connections: Unknown (08/28/2021)   Social Connection and Isolation Panel [NHANES]    Frequency of Communication with Friends and Family: Three times a week    Frequency of Social Gatherings with Friends and Family: More than three times a week    Attends Religious Services: Never     Marine scientist or Organizations: No    Attends Archivist Meetings: Never    Marital Status: Not on file  Intimate Partner Violence: Not on file    Family History:  Family History  Problem Relation Age of Onset   Arthritis Mother    Leukemia Mother        CLL/Dr. Neurosurgeon at [facility]   Hypertension  Father    Diabetes Father    Heart disease Maternal Grandmother    Cancer Maternal Grandfather    Liver cancer Paternal Grandmother    Cerebral aneurysm Paternal Grandfather     Medications:   Current Outpatient Medications on File Prior to Visit  Medication Sig Dispense Refill   aspirin 325 MG tablet Take 1 tablet (325 mg total) by mouth daily.     atorvastatin (LIPITOR) 40 MG tablet Take 1 tablet (40 mg total) by mouth daily. 90 tablet 2   baclofen (LIORESAL) 20 MG tablet TAKE 1 TABLET (20 MG) BY MOUTH 4 (FOUR) TIMES DAILY. 360 tablet 3   cholecalciferol (VITAMIN D3) 25 MCG (1000 UT) tablet Take 1,000 Units by mouth daily.     cyanocobalamin 1000 MCG tablet Take 1 tablet (1,000 mcg total) by mouth daily. 30 tablet 2   folic acid (FOLVITE) 1 MG tablet TAKE 1 TABLET FOUR TIMES DAILY 360 tablet 3   levETIRAcetam (KEPPRA) 750 MG tablet TAKE 1 TABLET (750 MG) BY MOUTH 2 (TWO) TIMES DAILY. 180 tablet 3   OVER THE COUNTER MEDICATION Take 2 capsules by mouth See admin instructions. Psyllium fiber 2 capsules by mouth everyday     pantoprazole (PROTONIX) 40 MG tablet Take 40 mg by mouth daily as needed (for GERD).      pyridOXINE (B-6) 50 MG tablet Take 1 tablet (50 mg total) by mouth daily. 30 tablet 2   No current facility-administered medications on file prior to visit.    Allergies:  No Known Allergies   Physical Exam  There were no vitals filed for this visit.   There is no height or weight on file to calculate BMI. No results found.  General: well developed, well nourished, pleasant young Caucasian male, seated, in no evident distress Head: head normocephalic  and atraumatic.   Neck: supple with no carotid or supraclavicular bruits Cardiovascular: regular rate and rhythm, no murmurs Musculoskeletal: no deformity Skin:  no rash/petichiae Vascular:  Normal pulses all extremities  Neurologic Exam Mental Status: Awake and fully alert.  Severe expressive aphasia.  Nods appropriately to yes/no questions - able to say simple words.  Oriented to place and time. Recent and remote memory intact. Attention span, concentration and fund of knowledge appropriate. Mood and affect appropriate.  Cranial Nerves: Pupils equal, briskly reactive to light. Extraocular movements full without nystagmus. Visual fields partial right homonymous hemianopia.Marland Kitchen Hearing intact.  Left-sided facial sensation decreased compared to left side.  Lower facial paralysis right side.  Motor: RUE: 4/5 with spasticity throughout greatest in hand, RLE: 4+/5 hip flexor, 4/5 quad, foot drop with use of AFO.  Full strength left upper and lower extremity Sensory.:  Decreased light touch, pinprick and vibratory sensation right upper and lower extremity compared to left upper and lower extremity Coordination: Rapid alternating movements normal on left side. Finger-to-nose and heel-to-shin performed accurately on left side. Gait and Station: Arises from chair without difficulty. Stance is normal. Gait demonstrates hemiplegic gait with use of cane and AFO device Reflexes: 1+ left upper and lower extremity.  2+ right upper and lower extremity.  Toes downgoing.        ASSESSMENT: Xavier Villegas is a 41 y.o. year old male here with large left MCA infarct on 02/19/18 secondary to left ICA dissection s/p [id number] revascularization of L MCA M1/2 followed by decompressive craniectomy. Vascular risk factors include tobacco use, EtOH use, HTN and severe hyperhomocysteinemia.  He unfortunately has sustained recent seizure activity on 10/15/2018  likely related to prior large left MCA infarct and was initiated  on Keppra for seizure prophylaxis.     Left MCA stroke  -Residual right spastic hemiparesis, right homonymous hemianopia and expressive aphasia -stable.  Continue to follow with PMR for spasticity management with Botox and baclofen as well as continued participation with [facility] -Continue aspirin 325 mg daily for secondary stroke prevention -Close follow-up with PCP for aggressive stroke risk factor management  Hyperhomocystinemia -Continue T00, B6 and folic acid -Lab work (12/2589): Folate> 20, homocystine 8.1, B12 907, and B6 76.4 -Repeat lab work today  Seizure, late effect of stroke -Onset 10/15/2018 -No recurrent seizure activity -Continue Keppra 750 mg twice daily for seizure prophylaxis -refill up-to-date -Obtain CBC and CMP as not recently completed   Follow-up in 1 year or call earlier if needed   I spent 32 minutes of face-to-face and non-face-to-face time with patient, mother and father.  This included previsit chart review, lab review, study review, order entry, electronic health record documentation, patient education regarding history of stroke, residual deficits, managing stroke risk factors, poststroke seizure with ongoing use of AED and answered all questions to patient satisfaction   Frann Rider, [facility]  [facility] [address] [place], [place] [address]  Phone [phone] Fax [phone] Note: This document was prepared with digital dictation and possible smart phrase technology. Any transcriptional errors that result from this process are unintentional.

## 2022-02-23 NOTE — Progress Notes (Signed)
Established Patient Office Visit  Name: Xavier Villegas   MRN: [id number]    DOB: 03-11-1981   Date:02/23/2022  Today's Provider: Talitha Givens, MHS, PA-C Introduced myself to the patient as a PA-C and provided education on APPs in clinical practice.         Subjective  Chief Complaint  Chief Complaint  Patient presents with   Hypertension   Obesity    Hypertension Pertinent negatives include no blurred vision, chest pain, headaches, palpitations or shortness of breath.   Patient is here with his father who is assisting with HPI and answers to questions  Elevated BP readings Has been taking BP at home  Avg 130s/ 70s with some in 120s  Diet: Normal diet - salmon, chicken and [place]. States he doesn't eat much vegetables Exercise: Walking about a half a mile per day, everyday       Obesity Diet: Mostly lean protein, some fried foods and baked potatoes. Reports he has cut out most of the junk food.  Exercise: appears to be gradually increasing walking. Walking about half mile per day everyday at this time     Patient Active Problem List   Diagnosis Date Noted   Obesity (BMI 30-39.9) 02/23/2022   Hyperlipidemia 11/24/2021   Prehypertension 07/22/2021   Need for influenza vaccination 07/22/2021   CVA (cerebral vascular accident) ([place]) 05/02/2018   Neurogenic bowel    Neurogenic bladder    Spastic hemiparesis affecting dominant side ([place])    Cerebral infarction due to embolism of left middle cerebral artery ([place]) s/p thrombectomy 03/01/2018   Carotid artery dissection ([place]) Left 03/01/2018   Acute ischemic right MCA stroke ([place] 03/01/2018   Right hemiparesis ([place])    Dysphagia, post-stroke    Global aphasia    Leukocytosis    Folic acid deficiency 29/93/7169   B12 deficiency 02/23/2018   Hyperhomocysteinemia ([place]) 02/23/2018   Smoker     Past Surgical History:  Procedure Laterality Date   CRANIOTOMY Left 02/21/2018   Procedure: DECOMPRESSIVE  CRANIECTOMY with bone flap to abdomen;  Surgeon: [name], MD;  Location: [place];  Service: Neurosurgery;  Laterality: Left;  DECOMPRESSIVE CRANIECTOMY with bone flap to abdomen   CRANIOTOMY N/A 05/02/2018   Procedure: RE-IMPLANTATION OF CRANIAL FLAP;  Surgeon: [name], MD;  Location: [place];  Service: Neurosurgery;  Laterality: N/A;  RE-IMPLANTATION OF CRANIAL FLAP   IR ANGIO INTRA EXTRACRAN SEL COM CAROTID INNOMINATE UNI R MOD SED  02/19/2018   IR ANGIO VERTEBRAL SEL VERTEBRAL BILAT MOD SED  02/19/2018   IR CT HEAD LTD  02/19/2018   IR PERCUTANEOUS ART THROMBECTOMY/INFUSION INTRACRANIAL INC DIAG ANGIO  02/19/2018   IR US GUIDE VASC ACCESS RIGHT  02/19/2018   RADIOLOGY WITH ANESTHESIA N/A 02/19/2018   Procedure: IR WITH ANESTHESIA CODE STROKE;  Surgeon: Corrie Mckusick, DO;  Location: [place];  Service: Anesthesiology;  Laterality: N/A;   TEE WITHOUT CARDIOVERSION N/A 03/01/2018   Procedure: TRANSESOPHAGEAL ECHOCARDIOGRAM (TEE);  Surgeon: Sanda Klein, MD;  [facility]: [facility] ENDOSCOPY;  Service: Cardiovascular;  Laterality: N/A;    Family History  Problem Relation Age of Onset   Arthritis Mother    Leukemia Mother        CLL/[facility]. Leretha Pol at [facility]   Hypertension Father    Diabetes Father    Heart disease Maternal Grandmother    Cancer Maternal Grandfather    Liver cancer Paternal Grandmother    Cerebral aneurysm Paternal Grandfather  Social History   Tobacco Use   Smoking status: Former    Packs/day: 0.50    Years: 8.00    Total pack years: 4.00    Types: Cigarettes    Quit date: 02/19/2018    Years since quitting: 4.0   Smokeless tobacco: Never  Substance Use Topics   Alcohol use: Not Currently    Alcohol/week: 14.0 standard drinks of alcohol    Types: 14 Cans of beer per week     Current Outpatient Medications:    aspirin 325 MG tablet, Take 1 tablet (325 mg total) by mouth daily., Disp: , Rfl:    atorvastatin (LIPITOR) 40 MG tablet, Take 1 tablet (40 mg total) by mouth daily.,  Disp: 90 tablet, Rfl: 2   baclofen (LIORESAL) 20 MG tablet, TAKE 1 TABLET (20 MG) BY MOUTH 4 (FOUR) TIMES DAILY., Disp: 360 tablet, Rfl: 3   cholecalciferol (VITAMIN D3) 25 MCG (1000 UT) tablet, Take 1,000 Units by mouth daily., Disp: , Rfl:    cyanocobalamin 1000 MCG tablet, Take 1 tablet (1,000 mcg total) by mouth daily., Disp: 30 tablet, Rfl: 2   folic acid (FOLVITE) 1 MG tablet, TAKE 1 TABLET FOUR TIMES DAILY, Disp: 360 tablet, Rfl: 3   levETIRAcetam (KEPPRA) 750 MG tablet, TAKE 1 TABLET (750 MG) BY MOUTH 2 (TWO) TIMES DAILY., Disp: 180 tablet, Rfl: 3   OVER THE COUNTER MEDICATION, Take 2 capsules by mouth See admin instructions. Psyllium fiber 2 capsules by mouth everyday, Disp: , Rfl:    pantoprazole (PROTONIX) 40 MG tablet, Take 40 mg by mouth daily as needed (for GERD). , Disp: , Rfl:    pyridOXINE (B-6) 50 MG tablet, Take 1 tablet (50 mg total) by mouth daily., Disp: 30 tablet, Rfl: 2  No Known Allergies  I personally reviewed active problem list, medication list, allergies, notes from last encounter, notes from last 3 encounters, lab results with the patient/caregiver today.   Review of Systems  Constitutional:  Negative for chills, fever and weight loss.  Eyes:  Negative for blurred vision and double vision.  Respiratory:  Negative for shortness of breath and wheezing.   Cardiovascular:  Negative for chest pain, palpitations and leg swelling.  Neurological:  Negative for dizziness and headaches.      Objective  Vitals:   02/23/22 1049  BP: 137/77  Pulse: 65  Temp: 97.6 F (36.4 C)  TempSrc: Oral  SpO2: 98%  Weight: 266 lb 9.6 oz (120.9 kg)    Body mass index is 39.35 kg/m.  Physical Exam Vitals reviewed.  Constitutional:      General: He is awake.     Appearance: Normal appearance. He is well-developed and well-groomed. He is obese.  HENT:     Head: Normocephalic.  Cardiovascular:     Rate and Rhythm: Normal rate and regular rhythm.     Pulses: Normal  pulses.     Heart sounds: Normal heart sounds. No murmur heard.    No friction rub. No gallop.  Pulmonary:     Effort: Pulmonary effort is normal.     Breath sounds: Normal breath sounds. No decreased air movement. No decreased breath sounds, wheezing, rhonchi or rales.  Musculoskeletal:     Right lower leg: No edema.     Left lower leg: No edema.  Neurological:     Mental Status: He is alert.  Psychiatric:        Attention and Perception: Attention and perception normal.  Mood and Affect: Mood and affect normal.        Speech: Speech is delayed.        Behavior: Behavior normal. Behavior is cooperative.      No results found for this or any previous visit (from the past 2160 hour(s)).   PHQ2/9:    02/23/2022   11:35 AM 02/11/2022   11:37 AM 01/08/2022    9:54 AM 11/24/2021    9:47 AM 11/05/2021    9:21 AM  Depression screen PHQ 2/9  Decreased Interest 0 0 0 0 0  Down, Depressed, Hopeless 0 0 0 0 0  PHQ - 2 Score 0 0 0 0 0  Altered sleeping 0  0 0   Tired, decreased energy 1  0 1   Change in appetite 1  0 0   Feeling bad or failure about yourself  0  0 0   Trouble concentrating 0  0 0   Moving slowly or fidgety/restless '3  3 3   '$ Suicidal thoughts 0  0 0   PHQ-9 Score '5  3 4   '$ Difficult doing work/chores Extremely dIfficult  Not difficult at all Not difficult at all       Fall Risk:    02/23/2022   11:35 AM 02/11/2022   11:37 AM 01/08/2022    9:54 AM 11/24/2021    9:46 AM 11/05/2021    9:21 AM  Fall Risk   Falls in the past year? 0 0 0 0 0  Number falls in past yr: 0 0 0 0   Injury with Fall? 0  0 0   Risk for fall due to : No Fall Risks  No Fall Risks No Fall Risks   Follow up Falls evaluation completed  Falls evaluation completed Falls evaluation completed       Functional Status Survey:      Assessment & Plan  Problem List Items Addressed This Visit       Other   Prehypertension - Primary    Unsure of chronicity but appears to be  ongoing Reviewed BP recommendations  Would prefer for him to try increasing adherence to lifestyle modifications and will review at-home BP readings in one month before adding antihypertensive agent Follow up in 1 month with BP log from home        Obesity (BMI 30-39.9)    Chronic, historic condition, ongoing Patient and his father report he is trying to walk and exercise more as tolerated  Reviewed diet and exercise recommendations and discussed importance of this with heart health and blood pressure management  Recommend increasing lean protein, vegetables, and whole grains Recommend increased efforts to engage in physical activity  Follow up as needed         Return in about 4 weeks (around 03/23/2022) for elevated BP .   I, Reha Martinovich E Maylie Ashton, PA-C, have reviewed all documentation for this visit. The documentation on 02/23/22 for the exam, diagnosis, procedures, and orders are all accurate and complete.   Talitha Givens, MHS, PA-C [facility]

## 2022-02-23 NOTE — Patient Instructions (Signed)
   Your blood pressure was mildly elevated today.  If possible please take it at home using an electronic blood pressure cuff for the upper arm Record your blood pressure once per day and bring them back with you to your apt so we can make sure you are not developing high blood pressure.   Incorporating a minimum of 150 minutes (20-30 minutes per day) of moderate intensity physical activity can help improve your heart health and reduce the chances of high blood pressure and other cardiovascular risks. Incorporating a heart healthy diet can also help reduce the chances of heart attack and high cholesterol.

## 2022-02-23 NOTE — Assessment & Plan Note (Signed)
Chronic, historic condition, ongoing Patient and his father report he is trying to walk and exercise more as tolerated  Reviewed diet and exercise recommendations and discussed importance of this with heart health and blood pressure management  Recommend increasing lean protein, vegetables, and whole grains Recommend increased efforts to engage in physical activity  Follow up as needed

## 2022-02-24 ENCOUNTER — Encounter: Payer: Self-pay | Admitting: Adult Health

## 2022-02-24 ENCOUNTER — Ambulatory Visit: Payer: Medicare HMO | Admitting: Adult Health

## 2022-02-24 VITALS — BP 141/85 | HR 74 | Ht 69.0 in | Wt 267.0 lb

## 2022-02-24 DIAGNOSIS — R569 Unspecified convulsions: Secondary | ICD-10-CM | POA: Diagnosis not present

## 2022-02-24 DIAGNOSIS — I69398 Other sequelae of cerebral infarction: Secondary | ICD-10-CM

## 2022-02-24 DIAGNOSIS — I6930 Unspecified sequelae of cerebral infarction: Secondary | ICD-10-CM

## 2022-02-24 NOTE — Patient Instructions (Addendum)
No changes today - continue current treatment plan  Continue keppra '750mg'$  twice daily for seizure prevention - refill provided    Follow up in 1 year or call earlier if needed

## 2022-03-05 ENCOUNTER — Ambulatory Visit: Payer: Medicare HMO | Admitting: Adult Health

## 2022-03-06 ENCOUNTER — Encounter: Payer: Self-pay | Admitting: Family

## 2022-03-23 ENCOUNTER — Ambulatory Visit: Payer: Medicare HMO | Admitting: Physician Assistant

## 2022-03-24 ENCOUNTER — Encounter: Payer: Self-pay | Admitting: Physician Assistant

## 2022-03-24 ENCOUNTER — Ambulatory Visit (INDEPENDENT_AMBULATORY_CARE_PROVIDER_SITE_OTHER): Payer: Medicare HMO | Admitting: Physician Assistant

## 2022-03-24 DIAGNOSIS — R030 Elevated blood-pressure reading, without diagnosis of hypertension: Secondary | ICD-10-CM | POA: Diagnosis not present

## 2022-03-24 NOTE — Progress Notes (Signed)
Established Patient Office Visit  Name: Xavier Villegas   MRN: [id number]    DOB: 20-May-1981   Date:03/24/2022  Today's Provider: Talitha Givens, MHS, PA-C Introduced myself to the patient as a PA-C and provided education on APPs in clinical practice.         Subjective  Chief Complaint  Chief Complaint  Patient presents with   Prehypertensive    Patient did not bring bp log in    HPI  Reviewed photos of BP logs  Bps at home are predominantly in 110s-120s/70s   Patient is walking regularly and has a dog that needs frequent walks  ROS for pertinent +/-   Patient Active Problem List   Diagnosis Date Noted   Obesity (BMI 30-39.9) 02/23/2022   Hyperlipidemia 11/24/2021   Prehypertension 07/22/2021   Need for influenza vaccination 07/22/2021   CVA (cerebral vascular accident) ([place]) 05/02/2018   Neurogenic bowel    Neurogenic bladder    Spastic hemiparesis affecting dominant side ([place])    Cerebral infarction due to embolism of left middle cerebral artery ([place]) s/p thrombectomy 03/01/2018   Carotid artery dissection ([place]) Left 03/01/2018   Acute ischemic right MCA stroke ([place]) 03/01/2018   Right hemiparesis ([place])    Dysphagia, post-stroke    Global aphasia    Leukocytosis    Folic acid deficiency 63/14/9702   B12 deficiency 02/23/2018   Hyperhomocysteinemia ([place]) 02/23/2018   Smoker     Past Surgical History:  Procedure Laterality Date   CRANIOTOMY Left 02/21/2018   Procedure: DECOMPRESSIVE CRANIECTOMY with bone flap to abdomen;  Surgeon: [name], MD;  Location: [place];  Service: Neurosurgery;  Laterality: Left;  DECOMPRESSIVE CRANIECTOMY with bone flap to abdomen   CRANIOTOMY N/A 05/02/2018   Procedure: RE-IMPLANTATION OF CRANIAL FLAP;  Surgeon: [name], MD;  Location: [place];  Service: Neurosurgery;  Laterality: N/A;  RE-IMPLANTATION OF CRANIAL FLAP   IR ANGIO INTRA EXTRACRAN SEL COM CAROTID INNOMINATE UNI R MOD SED  02/19/2018   IR ANGIO VERTEBRAL  SEL VERTEBRAL BILAT MOD SED  02/19/2018   IR CT HEAD LTD  02/19/2018   IR PERCUTANEOUS ART THROMBECTOMY/INFUSION INTRACRANIAL INC DIAG ANGIO  02/19/2018   IR US GUIDE VASC ACCESS RIGHT  02/19/2018   RADIOLOGY WITH ANESTHESIA N/A 02/19/2018   Procedure: IR WITH ANESTHESIA CODE STROKE;  Surgeon: Corrie Mckusick, DO;  Location: [place];  Service: Anesthesiology;  Laterality: N/A;   TEE WITHOUT CARDIOVERSION N/A 03/01/2018   Procedure: TRANSESOPHAGEAL ECHOCARDIOGRAM (TEE);  Surgeon: Sanda Klein, MD;  Location: [facility] ENDOSCOPY;  Service: Cardiovascular;  Laterality: N/A;    Family History  Problem Relation Age of Onset   Arthritis Mother    Leukemia Mother        CLL/[facility]. Leretha [facility] at [facility]   Hypertension Father    Diabetes Father    Heart disease Maternal Grandmother    Cancer Maternal Grandfather    Liver cancer Paternal Grandmother    Cerebral aneurysm Paternal Grandfather     Social History   Tobacco Use   Smoking status: Former    Packs/day: 0.50    Years: 8.00    Total pack years: 4.00    Types: Cigarettes    Quit date: 02/19/2018    Years since quitting: 4.0   Smokeless tobacco: Never  Substance Use Topics   Alcohol use: Not Currently    Alcohol/week: 14.0 standard drinks of alcohol    Types: 14 Cans of beer per  week     Current Outpatient Medications:    aspirin 325 MG tablet, Take 1 tablet (325 mg total) by mouth daily., Disp: , Rfl:    atorvastatin (LIPITOR) 40 MG tablet, Take 1 tablet (40 mg total) by mouth daily., Disp: 90 tablet, Rfl: 2   baclofen (LIORESAL) 20 MG tablet, TAKE 1 TABLET (20 MG) BY MOUTH 4 (FOUR) TIMES DAILY., Disp: 360 tablet, Rfl: 3   cholecalciferol (VITAMIN D3) 25 MCG (1000 UT) tablet, Take 1,000 Units by mouth daily., Disp: , Rfl:    cyanocobalamin 1000 MCG tablet, Take 1 tablet (1,000 mcg total) by mouth daily., Disp: 30 tablet, Rfl: 2   folic acid (FOLVITE) 1 MG tablet, TAKE 1 TABLET FOUR TIMES DAILY, Disp: 360 tablet, Rfl: 3   levETIRAcetam (KEPPRA)  750 MG tablet, TAKE 1 TABLET (750 MG) BY MOUTH 2 (TWO) TIMES DAILY., Disp: 180 tablet, Rfl: 3   OVER THE COUNTER MEDICATION, Take 2 capsules by mouth See admin instructions. Psyllium fiber 2 capsules by mouth everyday, Disp: , Rfl:    pantoprazole (PROTONIX) 40 MG tablet, Take 40 mg by mouth daily as needed (for GERD). , Disp: , Rfl:    pyridOXINE (B-6) 50 MG tablet, Take 1 tablet (50 mg total) by mouth daily., Disp: 30 tablet, Rfl: 2  No Known Allergies  I personally reviewed active problem list, medication list, allergies, notes from last encounter, lab results with the patient/caregiver today.   Review of Systems  Eyes:  Negative for blurred vision and double vision.  Respiratory:  Negative for shortness of breath and wheezing.   Cardiovascular:  Negative for chest pain, palpitations and leg swelling.  Musculoskeletal:  Negative for back pain.  Neurological:  Negative for dizziness and headaches.      Objective  Vitals:   03/24/22 1413  BP: 130/83  Pulse: 87  Temp: 98.6 F (37 C)  TempSrc: Oral  SpO2: 97%  Weight: 261 lb 9.6 oz (118.7 kg)  Height: 5' 9.02" (1.753 m)    Body mass index is 38.61 kg/m.  Physical Exam Vitals reviewed.  Constitutional:      General: He is awake.     Appearance: Normal appearance. He is well-developed and well-groomed. He is obese.  Eyes:     General: Gaze aligned appropriately.     Extraocular Movements: Extraocular movements intact.     Conjunctiva/sclera: Conjunctivae normal.  Cardiovascular:     Rate and Rhythm: Normal rate and regular rhythm.     Pulses: Normal pulses.          Radial pulses are 2+ on the right side and 2+ on the left side.     Heart sounds: Normal heart sounds.  Pulmonary:     Effort: Pulmonary effort is normal.     Breath sounds: Normal breath sounds.  Musculoskeletal:     Right lower leg: No edema.     Left lower leg: No edema.  Neurological:     Mental Status: He is alert.     Cranial Nerves: No facial  asymmetry.  Psychiatric:        Attention and Perception: Attention normal.        Speech: He is noncommunicative.        Behavior: Behavior normal. Behavior is cooperative.     Comments: Patient is overall nonverbal during exam      No results found for this or any previous visit (from the past 2160 hour(s)).   PHQ2/9:    03/24/2022  2:25 PM 02/23/2022   11:35 AM 02/11/2022   11:37 AM 01/08/2022    9:54 AM 11/24/2021    9:47 AM  Depression screen PHQ 2/9  Decreased Interest 0 0 0 0 0  Down, Depressed, Hopeless 0 0 0 0 0  PHQ - 2 Score 0 0 0 0 0  Altered sleeping 0 0  0 0  Tired, decreased energy 1 1  0 1  Change in appetite 1 1  0 0  Feeling bad or failure about yourself  0 0  0 0  Trouble concentrating 2 0  0 0  Moving slowly or fidgety/restless '3 3  3 3  '$ Suicidal thoughts 0 0  0 0  PHQ-9 Score '7 5  3 4  '$ Difficult doing work/chores Extremely dIfficult Extremely dIfficult  Not difficult at all Not difficult at all      Fall Risk:    03/24/2022    2:25 PM 02/23/2022   11:35 AM 02/11/2022   11:37 AM 01/08/2022    9:54 AM 11/24/2021    9:46 AM  Fall Risk   Falls in the past year? 0 0 0 0 0  Number falls in past yr: 0 0 0 0 0  Injury with Fall? 0 0  0 0  Risk for fall due to : Impaired mobility;Impaired balance/gait No Fall Risks  No Fall Risks No Fall Risks  Follow up Falls evaluation completed Falls evaluation completed  Falls evaluation completed Falls evaluation completed      Functional Status Survey:      Assessment & Plan  Problem List Items Addressed This Visit       Other   Prehypertension    Appears resolved at this time Patient BP logs were reviewed and demonstrate readings in the 110s/120s/80s Patient's father reports pt is walking and trying to be more active Recommend he continue with diet and exercise efforts  Do not recommend antihypertensives at this time given success of lifestyle management Continue to monitor BP at home  Follow up in 4  months for labs         Return in about 3 months (around 06/24/2022) for hld labs.   I, Adeja Sarratt E Sneha Willig, PA-C, have reviewed all documentation for this visit. The documentation on 03/24/22 for the exam, diagnosis, procedures, and orders are all accurate and complete.   Talitha Givens, MHS, PA-C [facility]

## 2022-03-24 NOTE — Assessment & Plan Note (Signed)
Appears resolved at this time Patient BP logs were reviewed and demonstrate readings in the 110s/120s/80s Patient's father reports pt is walking and trying to be more active Recommend he continue with diet and exercise efforts  Do not recommend antihypertensives at this time given success of lifestyle management Continue to monitor BP at home  Follow up in 4 months for labs

## 2022-04-14 ENCOUNTER — Ambulatory Visit: Payer: Medicare HMO | Admitting: Unknown Physician Specialty

## 2022-04-16 ENCOUNTER — Ambulatory Visit: Payer: Medicare HMO | Admitting: Unknown Physician Specialty

## 2022-04-21 DIAGNOSIS — M24531 Contracture, right wrist: Secondary | ICD-10-CM | POA: Diagnosis not present

## 2022-04-28 DIAGNOSIS — Z0289 Encounter for other administrative examinations: Secondary | ICD-10-CM

## 2022-05-04 ENCOUNTER — Telehealth: Payer: Self-pay

## 2022-05-04 NOTE — Telephone Encounter (Signed)
Started to completed forms and realized Dr Naaman Plummer has previously been managing.  Forms will need to go back to his office for completion. From placed in medical records for pick up

## 2022-05-04 NOTE — Telephone Encounter (Signed)
Forms received, completed and placed on NP desk for review and signature.

## 2022-05-05 ENCOUNTER — Telehealth: Payer: Self-pay | Admitting: Adult Health

## 2022-05-05 NOTE — Telephone Encounter (Signed)
ERROR

## 2022-05-05 NOTE — Telephone Encounter (Signed)
Advised patient that paperwork needed to be completed by PCP. He was ok with it ans had [place] shred the paperwork.

## 2022-05-13 ENCOUNTER — Telehealth: Payer: Self-pay

## 2022-05-13 NOTE — Telephone Encounter (Signed)
Entered in error

## 2022-05-20 ENCOUNTER — Encounter: Payer: Medicare HMO | Attending: Physical Medicine & Rehabilitation | Admitting: Physical Medicine & Rehabilitation

## 2022-05-20 ENCOUNTER — Encounter: Payer: Self-pay | Admitting: Physical Medicine & Rehabilitation

## 2022-05-20 VITALS — BP 143/89 | HR 83 | Ht 69.0 in | Wt 260.0 lb

## 2022-05-20 DIAGNOSIS — G8110 Spastic hemiplegia affecting unspecified side: Secondary | ICD-10-CM | POA: Insufficient documentation

## 2022-05-20 MED ORDER — ABOBOTULINUMTOXINA 300 UNITS IM SOLR
300.0000 [IU] | Freq: Once | INTRAMUSCULAR | Status: AC
  Administered 2022-05-20: 300 [IU] via INTRAMUSCULAR

## 2022-05-20 MED ORDER — SODIUM CHLORIDE (PF) 0.9 % IJ SOLN
4.0000 mL | Freq: Once | INTRAMUSCULAR | Status: AC
  Administered 2022-05-20: 4 mL via INTRAVENOUS

## 2022-05-20 MED ORDER — ABOBOTULINUMTOXINA 500 UNITS IM SOLR
500.0000 [IU] | Freq: Once | INTRAMUSCULAR | Status: AC
  Administered 2022-05-20: 500 [IU] via INTRAMUSCULAR

## 2022-05-20 NOTE — Progress Notes (Signed)
Dysport Injection for spasticity using needle EMG guidance Indication:  Spastic hemiparesis affecting dominant side (HCC) G81.10  Dilution: 500 Units/89m        Total Units Injected:  800 Indication: Severe spasticity which interferes with ADL,mobility and/or  hygiene and is unresponsive to medication management and other conservative care Informed consent was obtained after describing risks and benefits of the procedure with the patient. This includes bleeding, bruising, infection, excessive weakness, or medication side effects. A REMS form is on file and signed.  Right Needle: 52minjectable monopolar needle electrode  Number of units per muscle Pectoralis Major 150 units Pectoralis Minor 150 units Biceps 0 units Brachioradialis 0 units FCR 10 units FCU 10 units FDS 200 units FDP 200 units FPL 0 units Pronator Teres 0 units Pronator Quadratus 0 units Lumbricals 80 units  All injections were done after obtaining appropriate EMG activity and after negative drawback for blood. The patient tolerated the procedure well. Post procedure instructions were given.

## 2022-05-20 NOTE — Patient Instructions (Signed)
ALWAYS FEEL FREE TO CALL OUR OFFICE WITH ANY PROBLEMS OR QUESTIONS ([phone])  **PLEASE NOTE** ALL MEDICATION REFILL REQUESTS (INCLUDING CONTROLLED SUBSTANCES) NEED TO BE MADE AT LEAST 7 DAYS PRIOR TO REFILL BEING DUE. ANY REFILL REQUESTS INSIDE THAT TIME FRAME MAY RESULT IN DELAYS IN RECEIVING YOUR PRESCRIPTION.                    

## 2022-06-08 ENCOUNTER — Other Ambulatory Visit: Payer: Self-pay

## 2022-06-08 MED ORDER — ATORVASTATIN CALCIUM 40 MG PO TABS: 40.0000 mg | ORAL_TABLET | Freq: Every day | ORAL | 1 refills | Status: DC

## 2022-06-08 NOTE — Telephone Encounter (Signed)
Medication refill for Atorvastatin 40 mg last ov 03/24/22, upcoming ov 06/24/22 . Please advise

## 2022-06-12 ENCOUNTER — Other Ambulatory Visit: Payer: Self-pay

## 2022-06-12 NOTE — Telephone Encounter (Signed)
Refill req for Atorvastatin 40 MG. Routing to last provider who saw patient. Last office visit with Xavier Panning Mecum PA on 03/24/2022. Follow up scheduled for 06/24/2022. Please advise.

## 2022-06-24 ENCOUNTER — Encounter: Payer: Self-pay | Admitting: Physician Assistant

## 2022-06-24 ENCOUNTER — Ambulatory Visit (INDEPENDENT_AMBULATORY_CARE_PROVIDER_SITE_OTHER): Payer: Medicare HMO | Admitting: Physician Assistant

## 2022-06-24 VITALS — BP 131/85 | HR 77 | Temp 97.7°F | Wt 259.2 lb

## 2022-06-24 DIAGNOSIS — E531 Pyridoxine deficiency: Secondary | ICD-10-CM | POA: Diagnosis not present

## 2022-06-24 DIAGNOSIS — I639 Cerebral infarction, unspecified: Secondary | ICD-10-CM | POA: Diagnosis not present

## 2022-06-24 DIAGNOSIS — Z23 Encounter for immunization: Secondary | ICD-10-CM | POA: Diagnosis not present

## 2022-06-24 DIAGNOSIS — Z8639 Personal history of other endocrine, nutritional and metabolic disease: Secondary | ICD-10-CM | POA: Diagnosis not present

## 2022-06-24 DIAGNOSIS — G8110 Spastic hemiplegia affecting unspecified side: Secondary | ICD-10-CM | POA: Diagnosis not present

## 2022-06-24 DIAGNOSIS — E785 Hyperlipidemia, unspecified: Secondary | ICD-10-CM | POA: Diagnosis not present

## 2022-06-24 DIAGNOSIS — E538 Deficiency of other specified B group vitamins: Secondary | ICD-10-CM | POA: Diagnosis not present

## 2022-06-24 MED ORDER — ATORVASTATIN CALCIUM 40 MG PO TABS: 40.0000 mg | ORAL_TABLET | Freq: Every day | ORAL | 1 refills | Status: DC

## 2022-06-24 NOTE — Assessment & Plan Note (Signed)
Historic concern Unsure of most recent Vitamin D level- will recheck today He is taking supplement at this time- results of labs to dictate if this should be continued Follow up in 6 months for continued monitoring

## 2022-06-24 NOTE — Assessment & Plan Note (Signed)
Chronic, ongoing condition Currently managed with Atorvastatin 40 mg PO QD and appears to be tolerating well Will recheck Lipid panel today - results to dictate further management Follow up in 6 months for continued monitoring

## 2022-06-24 NOTE — Assessment & Plan Note (Signed)
Chronic, ongoing sequela from incident in 2019  He is regularly followed by Neurology and Phys Med for monitoring and management Will optimize cholesterol management and monitor BP  Lipid panel repeated today- Atorvastatin 40 mg PO QD refills provided Continue collaboration with specialty services

## 2022-06-24 NOTE — Assessment & Plan Note (Signed)
Chronic, ongoing condition, secondary to CVA in 2019  He is regularly seeing Neurology and Phys medicine for monitoring and management Continue coordination and collaboration with specialties

## 2022-06-24 NOTE — Progress Notes (Signed)
Established Patient Office Visit  Name: Xavier Villegas   MRN: [id number]    DOB: 1981/07/08   Date:06/24/2022  Today's Provider: Talitha Givens, MHS, PA-C Introduced myself to the patient as a PA-C and provided education on APPs in clinical practice.         Subjective  Chief Complaint  Chief Complaint  Patient presents with   Hyperlipidemia    Patient states he is almost out of atorvastatin 68m would like refill today    Hypertension    HPI   HYPERLIPIDEMIA Hyperlipidemia status: excellent compliance Satisfied with current treatment?  yes Side effects:  no Medication compliance: excellent compliance Past cholesterol meds: atorvastain (lipitor) Supplements: none Aspirin:  yes The ASCVD Risk score (Arnett DK, et al., 2019) failed to calculate for the following reasons:   The patient has a prior MI or stroke diagnosis Chest pain:  no Coronary artery disease:  no Family history CAD:  yes Family history early CAD:  no   Patient Active Problem List   Diagnosis Date Noted   Vitamin B6 deficiency 06/24/2022   H/O vitamin D deficiency 06/24/2022   Obesity (BMI 30-39.9) 02/23/2022   Hyperlipidemia 11/24/2021   Need for influenza vaccination 07/22/2021   CVA (cere[place] vascular [place]) [place]   Neurogenic bowel    Neurogenic bladder    Spastic hemiparesis affecting dominant side ([place]    Cerebral infarction due to embolism of left middle cerebral artery ([place] s/p thrombectomy 03/01/2018   Carotid artery dissection ([place] Left 03/01/2018   Acute ischemic right MCA stroke ([place] 03/01/2018   Right hemiparesis ([place]    Dysphagia, post-stroke    Global aphasia    Leukocytosis    Folic acid deficiency 044/81/8563  B12 deficiency 02/23/2018   Hyperhomocysteinemia ([place] 02/23/2018   Smoker     Past Surgical History:  Procedure Laterality Date   CRANIOTOMY Left 02/21/2018   Procedure: DECOMPRESSIVE CRANIECTOMY with bone flap to abdomen;   Surgeon: CKary Kos MD;  [place]  Service: Neurosurgery;  Laterality: Left;  DECOMPRESSIVE CRANIECTOMY with bone flap to abdomen   CRANIOTOMY N/A 05/02/2018   Procedure: RE-IMPLANTATION OF CRANIAL FLAP;  Surgeon: CKary Kos MD;  [place]  Service: Neurosurgery;  Laterality: N/A;  RE-IMPLANTATION OF CRANIAL FLAP   IR ANGIO INTRA EXTRACRAN SEL COM CAROTID INNOMINATE UNI R MOD SED  02/19/2018   IR ANGIO VERTEBRAL SEL VERTEBRAL BILAT MOD SED  02/19/2018   IR CT HEAD LTD  02/19/2018   IR PERCUTANEOUS ART THROMBECTOMY/INFUSION INTRACRANIAL INC DIAG ANGIO  02/19/2018   IR [place] ACCESS RIGHT  02/19/2018   RADIOLOGY WITH ANESTHESIA N/A 02/19/2018   Procedure: IR WITH ANESTHESIA CODE STROKE;  Surgeon: WCorrie Mckusick DO;  [place] [facility]  Service: Anesthesiology;  Laterality: N/A;   TEE WITHOUT CARDIOVERSION N/A 03/01/2018   Procedure: TRANSESOPHAGEAL ECHOCARDIOGRAM (TEE);  Surgeon: CSanda Klein MD;  [facility]  Service: Cardiovascular;  Laterality: N/A;    Family History  Problem Relation Age of Onset   Arthritis Mother    Leukemia Mother        [facility]  Hypertension Father    Diabetes Father    Heart disease Maternal Grandmother    Cancer Maternal Grandfather    Liver cancer Paternal Grandmother    Cerebral aneurysm Paternal Grandfather     Social History   Tobacco Use   Smoking status: Former    Packs/day:  0.50    Years: 8.00    Total pack years: 4.00    Types: Cigarettes    Quit date: 02/19/2018    Years since quitting: 4.3   Smokeless tobacco: Never  Substance Use Topics   Alcohol use: Not Currently    Alcohol/week: 14.0 standard drinks of alcohol    Types: 14 Cans of beer per week     Current Outpatient Medications:    aspirin 325 MG tablet, Take 1 tablet (325 mg total) by mouth daily., Disp: , Rfl:    baclofen (LIORESAL) 20 MG tablet, TAKE 1 TABLET (20 MG) BY MOUTH 4 (FOUR) TIMES DAILY., Disp: 360 tablet, Rfl: 3    cholecalciferol (VITAMIN D3) 25 MCG (1000 UT) tablet, Take 1,000 Units by mouth daily., Disp: , Rfl:    cyanocobalamin 1000 MCG tablet, Take 1 tablet (1,000 mcg total) by mouth daily., Disp: 30 tablet, Rfl: 2   folic acid (FOLVITE) 1 MG tablet, TAKE 1 TABLET FOUR TIMES DAILY, Disp: 360 tablet, Rfl: 3   levETIRAcetam (KEPPRA) 750 MG tablet, TAKE 1 TABLET (750 MG) BY MOUTH 2 (TWO) TIMES DAILY., Disp: 180 tablet, Rfl: 3   OVER THE COUNTER MEDICATION, Take 2 capsules by mouth See admin instructions. Psyllium fiber 2 capsules by mouth everyday, Disp: , Rfl:    pyridOXINE (B-6) 50 MG tablet, Take 1 tablet (50 mg total) by mouth daily., Disp: 30 tablet, Rfl: 2   atorvastatin (LIPITOR) 40 MG tablet, Take 1 tablet (40 mg total) by mouth daily., Disp: 90 tablet, Rfl: 1   pantoprazole (PROTONIX) 40 MG tablet, Take 40 mg by mouth daily as needed (for GERD).  (Patient not taking: Reported on 06/24/2022), Disp: , Rfl:   No Known Allergies  I personally reviewed active problem list, medication list, allergies, health maintenance, notes from last encounter, lab results with the patient/caregiver today.   Review of Systems  Constitutional:  Negative for chills, fever and malaise/fatigue.  Respiratory:  Negative for cough, shortness of breath and wheezing.   Cardiovascular:  Negative for chest pain, palpitations and leg swelling.  Musculoskeletal:  Negative for falls.  Neurological:  Negative for dizziness, tingling, tremors, seizures, loss of consciousness and headaches.      Objective  Vitals:   06/24/22 1120  BP: 131/85  Pulse: 77  Temp: 97.7 F (36.5 C)  SpO2: 98%  Weight: 259 lb 3.2 oz (117.6 kg)    Body mass index is 38.28 kg/m.  Physical Exam Vitals reviewed.  Constitutional:      General: He is awake.     Appearance: Normal appearance. He is well-developed and well-groomed.  HENT:     Head: Normocephalic and atraumatic.  Eyes:     General: Lids are normal. Gaze aligned  appropriately.     Extraocular Movements: Extraocular movements intact.     Conjunctiva/sclera: Conjunctivae normal.     Pupils: Pupils are equal, round, and reactive to light.  Cardiovascular:     Rate and Rhythm: Normal rate and regular rhythm.     Pulses: Normal pulses.          Radial pulses are 2+ on the right side and 2+ on the left side.     Heart sounds: Normal heart sounds. No murmur heard.    No friction rub. No gallop.  Pulmonary:     Effort: Pulmonary effort is normal.     Breath sounds: Normal breath sounds. No decreased air movement. No decreased breath sounds, wheezing, rhonchi or rales.  Musculoskeletal:  Cervical back: Normal range of motion.  Lymphadenopathy:     Head:     Right side of head: No submental, submandibular or preauricular adenopathy.     Left side of head: No submental, submandibular or preauricular adenopathy.     Upper Body:     Right upper body: No supraclavicular adenopathy.     Left upper body: No supraclavicular adenopathy.  Neurological:     Mental Status: He is alert. Mental status is at baseline.     Motor: Weakness and abnormal muscle tone present. No tremor.     Gait: Gait abnormal.     Comments: Right arm, hand, leg are weak compared to left with reduced function consistent with hemiparesis  Psychiatric:        Attention and Perception: Attention and perception normal. He does not perceive auditory hallucinations.        Mood and Affect: Mood and affect normal.        Behavior: Behavior normal. Behavior is cooperative.      No results found for this or any previous visit (from the past 2160 hour(s)).   PHQ2/9:    06/24/2022   11:29 AM 03/24/2022    2:25 PM 02/23/2022   11:35 AM 02/11/2022   11:37 AM 01/08/2022    9:54 AM  Depression screen PHQ 2/9  Decreased Interest 0 0 0 0 0  Down, Depressed, Hopeless 0 0 0 0 0  PHQ - 2 Score 0 0 0 0 0  Altered sleeping 0 0 0  0  Tired, decreased energy 0 1 1  0  Change in appetite 0 1 1   0  Feeling bad or failure about yourself  0 0 0  0  Trouble concentrating 0 2 0  0  Moving slowly or fidgety/restless 0 _0 Suicidal thoughts 0 0 0  0  PHQ-9 Score 0 _1 Difficult doing work/chores Not difficult at all Extremely dIfficult Extremely dIfficult  Not difficult at all      Fall Risk:    06/24/2022   11:30 AM 05/20/2022   10:40 AM 03/24/2022    2:25 PM 02/23/2022   11:35 AM 02/11/2022   11:37 AM  Fall Risk   Falls in the past year? 0 0 0 0 0  Number falls in past yr: 0  0 0 0  Injury with Fall? 0  0 0   Risk for fall due to : No Fall Risks  Impaired mobility;Impaired balance/gait No Fall Risks   Follow up Falls evaluation completed  Falls evaluation completed Falls evaluation completed       Functional Status Survey:      Assessment & Plan  Problem List Items Addressed This Visit       Cardiovascular and Mediastinum   CVA (cerebral vascular accident) ([place])    Chronic, ongoing sequela from incident in 2019  He is regularly followed by Neurology and Phys Med for monitoring and management Will optimize cholesterol management and monitor BP  Lipid panel repeated today- Atorvastatin 40 mg PO QD refills provided Continue collaboration with specialty services       Relevant Medications   atorvastatin (LIPITOR) 40 MG tablet   Other Relevant Orders   Comp Met (CMET)   CBC w/Diff     Nervous and Auditory   Spastic hemiparesis affecting dominant side (HCC)    Chronic, ongoing condition, secondary to CVA in 2019  He is regularly seeing Neurology and Phys  medicine for monitoring and management Continue coordination and collaboration with specialties         Other   B12 deficiency    Chronic, ongoing He is taking Cyanocobalamin 1000 mcg PO QD  Will recheck B12 today for monitoring- Results to dictate further management  Follow up in 6 months       Relevant Orders   B12   Need for influenza vaccination   Relevant Orders   Flu Vaccine QUAD 6+  mos PF IM (Fluarix Quad PF) (Completed)   Hyperlipidemia - Primary    Chronic, ongoing condition Currently managed with Atorvastatin 40 mg PO QD and appears to be tolerating well Will recheck Lipid panel today - results to dictate further management Follow up in 6 months for continued monitoring       Relevant Medications   atorvastatin (LIPITOR) 40 MG tablet   Other Relevant Orders   Lipid Profile   Vitamin B6 deficiency    Appears to be chronic, ongoing Currently taking B6 50 mg PO QD and appears to be tolerating well Will recheck levels today for continued monitoring- results to dictate further management Follow up in 6 months for monitoring       Relevant Orders   Vitamin B6   H/O vitamin D deficiency    Historic concern Unsure of most recent Vitamin D level- will recheck today He is taking supplement at this time- results of labs to dictate if this should be continued Follow up in 6 months for continued monitoring       Relevant Orders   Vitamin D (25 hydroxy)     Return in about 6 months (around 12/23/2022) for HLD, CVA - ongoing monitoring .   I, Thien Berka E Diahn Waidelich, PA-C, have reviewed all documentation for this visit. The documentation on 06/24/22 for the exam, diagnosis, procedures, and orders are all accurate and complete.   Talitha Givens, MHS, PA-C [facility]

## 2022-06-24 NOTE — Assessment & Plan Note (Signed)
Appears to be chronic, ongoing Currently taking B6 50 mg PO QD and appears to be tolerating well Will recheck levels today for continued monitoring- results to dictate further management Follow up in 6 months for monitoring

## 2022-06-24 NOTE — Assessment & Plan Note (Signed)
Chronic, ongoing He is taking Cyanocobalamin 1000 mcg PO QD  Will recheck B12 today for monitoring- Results to dictate further management  Follow up in 6 months

## 2022-06-28 LAB — CBC WITH DIFFERENTIAL/PLATELET
Basophils Absolute: 0.1 10*3/uL (ref 0.0–0.2)
Basos: 1 %
EOS (ABSOLUTE): 0.2 10*3/uL (ref 0.0–0.4)
Eos: 2 %
Hematocrit: 47.6 % (ref 37.5–51.0)
Hemoglobin: 17.2 g/dL (ref 13.0–17.7)
Immature Grans (Abs): 0 10*3/uL (ref 0.0–0.1)
Immature Granulocytes: 0 %
Lymphocytes Absolute: 3.3 10*3/uL — ABNORMAL HIGH (ref 0.7–3.1)
Lymphs: 30 %
MCH: 32.5 pg (ref 26.6–33.0)
MCHC: 36.1 g/dL — ABNORMAL HIGH (ref 31.5–35.7)
MCV: 90 fL (ref 79–97)
Monocytes Absolute: 0.7 10*3/uL (ref 0.1–0.9)
Monocytes: 6 %
Neutrophils Absolute: 6.7 10*3/uL (ref 1.4–7.0)
Neutrophils: 61 %
Platelets: 242 10*3/uL (ref 150–450)
RBC: 5.3 x10E6/uL (ref 4.14–5.80)
RDW: 13.3 % (ref 11.6–15.4)
WBC: 10.9 10*3/uL — ABNORMAL HIGH (ref 3.4–10.8)

## 2022-06-28 LAB — VITAMIN B12: Vitamin B-12: 978 pg/mL (ref 232–1245)

## 2022-06-28 LAB — COMPREHENSIVE METABOLIC PANEL
ALT: 40 IU/L (ref 0–44)
AST: 24 IU/L (ref 0–40)
Albumin/Globulin Ratio: 1.7 (ref 1.2–2.2)
Albumin: 4.7 g/dL (ref 4.1–5.1)
Alkaline Phosphatase: 157 IU/L — ABNORMAL HIGH (ref 44–121)
BUN/Creatinine Ratio: 11 (ref 9–20)
BUN: 10 mg/dL (ref 6–24)
Bilirubin Total: 0.7 mg/dL (ref 0.0–1.2)
CO2: 24 mmol/L (ref 20–29)
Calcium: 9.5 mg/dL (ref 8.7–10.2)
Chloride: 99 mmol/L (ref 96–106)
Creatinine, Ser: 0.95 mg/dL (ref 0.76–1.27)
Globulin, Total: 2.8 g/dL (ref 1.5–4.5)
Glucose: 74 mg/dL (ref 70–99)
Potassium: 4.1 mmol/L (ref 3.5–5.2)
Sodium: 141 mmol/L (ref 134–144)
Total Protein: 7.5 g/dL (ref 6.0–8.5)
eGFR: 104 mL/min/{1.73_m2} (ref >59)

## 2022-06-28 LAB — LIPID PANEL
Chol/HDL Ratio: 3.2 ratio (ref 0.0–5.0)
Cholesterol, Total: 117 mg/dL (ref 100–199)
HDL: 37 mg/dL — ABNORMAL LOW (ref >39)
LDL Chol Calc (NIH): 64 mg/dL (ref 0–99)
Triglycerides: 83 mg/dL (ref 0–149)
VLDL Cholesterol Cal: 16 mg/dL (ref 5–40)

## 2022-06-28 LAB — VITAMIN D 25 HYDROXY (VIT D DEFICIENCY, FRACTURES): Vit D, 25-Hydroxy: 45.1 ng/mL (ref 30.0–100.0)

## 2022-06-28 LAB — VITAMIN B6: Vitamin B6: 49.7 ug/L (ref 3.4–65.2)

## 2022-07-20 ENCOUNTER — Other Ambulatory Visit: Payer: Self-pay | Admitting: Physical Medicine & Rehabilitation

## 2022-07-20 DIAGNOSIS — E539 Vitamin B deficiency, unspecified: Secondary | ICD-10-CM

## 2022-07-20 DIAGNOSIS — I63412 Cerebral infarction due to embolism of left middle cerebral artery: Secondary | ICD-10-CM

## 2022-08-26 ENCOUNTER — Encounter: Payer: Medicare HMO | Attending: Physical Medicine & Rehabilitation | Admitting: Physical Medicine & Rehabilitation

## 2022-08-26 ENCOUNTER — Encounter: Payer: Self-pay | Admitting: Physical Medicine & Rehabilitation

## 2022-08-26 VITALS — BP 145/95 | HR 80 | Ht 69.0 in | Wt 265.0 lb

## 2022-08-26 DIAGNOSIS — G8110 Spastic hemiplegia affecting unspecified side: Secondary | ICD-10-CM | POA: Diagnosis not present

## 2022-08-26 DIAGNOSIS — G8191 Hemiplegia, unspecified affecting right dominant side: Secondary | ICD-10-CM | POA: Diagnosis not present

## 2022-08-26 MED ORDER — ABOBOTULINUMTOXINA 300 UNITS IM SOLR
300.0000 [IU] | Freq: Once | INTRAMUSCULAR | Status: AC
  Administered 2022-08-26: 300 [IU] via INTRAMUSCULAR

## 2022-08-26 MED ORDER — ABOBOTULINUMTOXINA 500 UNITS IM SOLR
500.0000 [IU] | Freq: Once | INTRAMUSCULAR | Status: AC
  Administered 2022-08-26: 500 [IU] via INTRAMUSCULAR

## 2022-08-26 NOTE — Patient Instructions (Signed)
ALWAYS FEEL FREE TO CALL OUR OFFICE WITH ANY PROBLEMS OR QUESTIONS ([phone])  **PLEASE NOTE** ALL MEDICATION REFILL REQUESTS (INCLUDING CONTROLLED SUBSTANCES) NEED TO BE MADE AT LEAST 7 DAYS PRIOR TO REFILL BEING DUE. ANY REFILL REQUESTS INSIDE THAT TIME FRAME MAY RESULT IN DELAYS IN RECEIVING YOUR PRESCRIPTION.                    

## 2022-08-26 NOTE — Progress Notes (Signed)
Dysport Injection for spasticity using needle EMG guidance Indication:  Right hemiparesis (HCC) - Plan: AbobotulinumtoxinA (DYSPORT) 500 units injection 500 Units, abobotulinumtoxinA (DYSPORT) 300 units injection 300 Units  Spastic hemiparesis affecting dominant side (HCC)   Dilution: 500 Units/34m        Total Units Injected:  800 Indication: Severe spasticity which interferes with ADL,mobility and/or  hygiene and is unresponsive to medication management and other conservative care Informed consent was obtained after describing risks and benefits of the procedure with the patient. This includes bleeding, bruising, infection, excessive weakness, or medication side effects. A REMS form is on file and signed.  right Needle: 51minjectable monopolar needle electrode  Number of units per muscle Pectoralis Major 0 units Pectoralis Minor 0 units Biceps 0 units Brachioradialis 0 units FCR 50 units FCU 50 units FDS 150 units FDP 150 units FPL 40 units APB,AFB 30 units each Pronator Teres 200 units Pronator Quadratus 0 units Lumbricals 100 units  All injections were done after obtaining appropriate EMG activity and after negative drawback for blood. The patient tolerated the procedure well. Post procedure instructions were given.

## 2022-09-23 ENCOUNTER — Other Ambulatory Visit: Payer: Self-pay | Admitting: Physical Medicine & Rehabilitation

## 2022-09-23 DIAGNOSIS — G8110 Spastic hemiplegia affecting unspecified side: Secondary | ICD-10-CM

## 2022-11-09 ENCOUNTER — Telehealth: Payer: Self-pay | Admitting: Nurse Practitioner

## 2022-11-09 NOTE — Telephone Encounter (Signed)
Contacted [facility] [place] to schedule their annual wellness visit. Appointment made for 12/01/2022.  Sherol Dade; Care Guide [facility] Direct Dial: [phone]

## 2022-11-18 ENCOUNTER — Other Ambulatory Visit: Payer: Self-pay | Admitting: Physician Assistant

## 2022-11-18 DIAGNOSIS — E785 Hyperlipidemia, unspecified: Secondary | ICD-10-CM

## 2022-11-18 NOTE — Telephone Encounter (Signed)
Requested Prescriptions  Pending Prescriptions Disp Refills   atorvastatin (LIPITOR) 40 MG tablet [Pharmacy Med Name: ATORVASTATIN CALCIUM 40 MG Tablet] 90 tablet 3    Sig: TAKE 1 TABLET EVERY DAY     Cardiovascular:  Antilipid - Statins Failed - 11/18/2022  3:21 AM      Failed - Lipid Panel in normal range within the last 12 months    Cholesterol, Total  Date Value Ref Range Status  06/24/2022 117 100 - 199 mg/dL Final   LDL Chol Calc (NIH)  Date Value Ref Range Status  06/24/2022 64 0 - 99 mg/dL Final   HDL  Date Value Ref Range Status  06/24/2022 37 (L) >39 mg/dL Final   Triglycerides  Date Value Ref Range Status  06/24/2022 83 0 - 149 mg/dL Final         Passed - Patient is not pregnant      Passed - Valid encounter within last 12 months    Recent Outpatient Visits           4 months ago Hyperlipidemia, unspecified hyperlipidemia type   [place] [facility] Mecum, Oswaldo Conroy, PA-C   7 months ago Prehypertension   [place] [address] [facility] Mecum, Oswaldo Conroy, PA-C   8 months ago Prehypertension   [place] [facility] Mecum, Oswaldo Conroy, PA-C   10 months ago Prehypertension   [place] [facility] [name], Avanti, MD   11 months ago Prehypertension   [place] [facility] [name], Avanti, MD       Future Appointments             In 1 month Larae Grooms, NP [facility]

## 2022-11-25 ENCOUNTER — Encounter: Payer: Self-pay | Admitting: Physical Medicine & Rehabilitation

## 2022-11-25 ENCOUNTER — Encounter: Payer: Medicare HMO | Attending: Physical Medicine & Rehabilitation | Admitting: Physical Medicine & Rehabilitation

## 2022-11-25 VITALS — BP 144/85 | HR 68 | Ht 69.0 in | Wt 267.8 lb

## 2022-11-25 DIAGNOSIS — G8110 Spastic hemiplegia affecting unspecified side: Secondary | ICD-10-CM | POA: Diagnosis not present

## 2022-11-25 MED ORDER — ABOBOTULINUMTOXINA 300 UNITS IM SOLR
300.0000 [IU] | Freq: Once | INTRAMUSCULAR | Status: AC
Start: 2022-11-25 — End: 2022-11-25
  Administered 2022-11-25: 300 [IU] via INTRAMUSCULAR

## 2022-11-25 MED ORDER — ABOBOTULINUMTOXINA 500 UNITS IM SOLR
500.0000 [IU] | Freq: Once | INTRAMUSCULAR | Status: AC
Start: 2022-11-25 — End: 2022-11-25
  Administered 2022-11-25: 500 [IU] via INTRAMUSCULAR

## 2022-11-25 NOTE — Progress Notes (Signed)
Dysport Injection for spasticity using needle EMG guidance Indication:  Right hemiparesis (HCC) - Plan: AbobotulinumtoxinA (DYSPORT) 500 units injection 500 Units, abobotulinumtoxinA (DYSPORT) 300 units injection 300 Units   Spastic hemiparesis affecting dominant side (HCC)  G81.10   Dilution: 500 Units/27ml        Total Units Injected:  800 Indication: Severe spasticity which interferes with ADL,mobility and/or  hygiene and is unresponsive to medication management and other conservative care Informed consent was obtained after describing risks and benefits of the procedure with the patient. This includes bleeding, bruising, infection, excessive weakness, or medication side effects. A REMS form is on file and signed.   right Needle: 50mm injectable monopolar needle electrode   Number of units per muscle Pectoralis Major 0 units Pectoralis Minor 0 units Biceps 0 units Brachioradialis 0 units FCR   50 units FCU   50 units FDS   150 units FDP   150 units FPL   25 units APB,AFB   50 units each Pronator Teres   200 units Pronator Quadratus 0 units Lumbricals   125 units 4 access points       All injections were done after obtaining appropriate EMG activity and after negative drawback for blood. The patient tolerated the procedure well. Post procedure instructions were given.

## 2022-11-25 NOTE — Patient Instructions (Signed)
ALWAYS FEEL FREE TO CALL OUR OFFICE WITH ANY PROBLEMS OR QUESTIONS ([phone])  **PLEASE NOTE** ALL MEDICATION REFILL REQUESTS (INCLUDING CONTROLLED SUBSTANCES) NEED TO BE MADE AT LEAST 7 DAYS PRIOR TO REFILL BEING DUE. ANY REFILL REQUESTS INSIDE THAT TIME FRAME MAY RESULT IN DELAYS IN RECEIVING YOUR PRESCRIPTION.                    

## 2022-12-01 ENCOUNTER — Ambulatory Visit (INDEPENDENT_AMBULATORY_CARE_PROVIDER_SITE_OTHER): Payer: Medicare HMO

## 2022-12-01 VITALS — Ht 69.0 in | Wt 267.0 lb

## 2022-12-01 DIAGNOSIS — Z0000 Encounter for general adult medical examination without abnormal findings: Secondary | ICD-10-CM | POA: Diagnosis not present

## 2022-12-01 NOTE — Patient Instructions (Signed)
Mr. Biegler , Thank you for taking time to come for your Medicare Wellness Visit. I appreciate your ongoing commitment to your health goals. Please review the following plan we discussed and let me know if I can assist you in the future.   These are the goals we discussed:  Goals      DIET - EAT MORE FRUITS AND VEGETABLES        This is a list of the screening recommended for you and due dates:  Health Maintenance  Topic Date Due   Hepatitis C Screening: USPSTF Recommendation to screen - Ages 41-79 yo.  Never done   DTaP/Tdap/Td vaccine (1 - Tdap) Never done   COVID-19 Vaccine (3 - 2023-24 season) 04/10/2022   Flu Shot  03/11/2023   Medicare Annual Wellness Visit  12/01/2023   HIV Screening  Completed   HPV Vaccine  Aged Out    Advanced directives: no  Conditions/risks identified: none  Next appointment: Follow up in one year for your annual wellness visit 12/07/23 @ 9:15 am by phone  Preventive Care 40-64 Years, Male Preventive care refers to lifestyle choices and visits with your health care provider that can promote health and wellness. What does preventive care include? A yearly physical exam. This is also called an annual well check. Dental exams once or twice a year. Routine eye exams. Ask your health care provider how often you should have your eyes checked. Personal lifestyle choices, including: Daily care of your teeth and gums. Regular physical activity. Eating a healthy diet. Avoiding tobacco and drug use. Limiting alcohol use. Practicing safe sex. Taking low-dose aspirin every day starting at age 40. What happens during an annual well check? The services and screenings done by your health care provider during your annual well check will depend on your age, overall health, lifestyle risk factors, and family history of disease. Counseling  Your health care provider may ask you questions about your: Alcohol use. Tobacco use. Drug use. Emotional  well-being. Home and relationship well-being. Sexual activity. Eating habits. Work and work Astronomer. Screening  You may have the following tests or measurements: Height, weight, and BMI. Blood pressure. Lipid and cholesterol levels. These may be checked every 5 years, or more frequently if you are over 46 years old. Skin check. Lung cancer screening. You may have this screening every year starting at age 110 if you have a 30-pack-year history of smoking and currently smoke or have quit within the past 15 years. Fecal occult blood test (FOBT) of the stool. You may have this test every year starting at age 58. Flexible sigmoidoscopy or colonoscopy. You may have a sigmoidoscopy every 5 years or a colonoscopy every 10 years starting at age 90. Prostate cancer screening. Recommendations will vary depending on your family history and other risks. Hepatitis C blood test. Hepatitis B blood test. Sexually transmitted disease (STD) testing. Diabetes screening. This is done by checking your blood sugar (glucose) after you have not eaten for a while (fasting). You may have this done every 1-3 years. Discuss your test results, treatment options, and if necessary, the need for more tests with your health care provider. Vaccines  Your health care provider may recommend certain vaccines, such as: Influenza vaccine. This is recommended every year. Tetanus, diphtheria, and acellular pertussis (Tdap, Td) vaccine. You may need a Td booster every 10 years. Zoster vaccine. You may need this after age 78. Pneumococcal 13-valent conjugate (PCV13) vaccine. You may need this if you have certain  conditions and have not been vaccinated. Pneumococcal polysaccharide (PPSV23) vaccine. You may need one or two doses if you smoke cigarettes or if you have certain conditions. Talk to your health care provider about which screenings and vaccines you need and how often you need them. This information is not intended to  replace advice given to you by your health care provider. Make sure you discuss any questions you have with your health care provider. Document Released: 08/23/2015 Document Revised: 04/15/2016 Document Reviewed: 05/28/2015 Elsevier Interactive Patient Education  2017 [employer].  Fall Prevention in the Home Falls can cause injuries. They can happen to people of all ages. There are many things you can do to make your home safe and to help prevent falls. What can I do on the outside of my home? Regularly fix the edges of walkways and driveways and fix any cracks. Remove anything that might make you trip as you walk through a door, such as a raised step or threshold. Trim any bushes or trees on the path to your home. Use bright outdoor lighting. Clear any walking paths of anything that might make someone trip, such as rocks or tools. Regularly check to see if handrails are loose or broken. Make sure that both sides of any steps have handrails. Any raised decks and porches should have guardrails on the edges. Have any leaves, snow, or ice cleared regularly. Use sand or salt on walking paths during winter. Clean up any spills in your garage right away. This includes oil or grease spills. What can I do in the bathroom? Use night lights. Install grab bars by the toilet and in the tub and shower. Do not use towel bars as grab bars. Use non-skid mats or decals in the tub or shower. If you need to sit down in the shower, use a plastic, non-slip stool. Keep the floor dry. Clean up any water that spills on the floor as soon as it happens. Remove soap buildup in the tub or shower regularly. Attach bath mats securely with double-sided non-slip rug tape. Do not have throw rugs and other things on the floor that can make you trip. What can I do in the bedroom? Use night lights. Make sure that you have a light by your bed that is easy to reach. Do not use any sheets or blankets that are too big for  your bed. They should not hang down onto the floor. Have a firm chair that has side arms. You can use this for support while you get dressed. Do not have throw rugs and other things on the floor that can make you trip. What can I do in the kitchen? Clean up any spills right away. Avoid walking on wet floors. Keep items that you use a lot in easy-to-reach places. If you need to reach something above you, use a strong step stool that has a grab bar. Keep electrical cords out of the way. Do not use floor polish or wax that makes floors slippery. If you must use wax, use non-skid floor wax. Do not have throw rugs and other things on the floor that can make you trip. What can I do with my stairs? Do not leave any items on the stairs. Make sure that there are handrails on both sides of the stairs and use them. Fix handrails that are broken or loose. Make sure that handrails are as long as the stairways. Check any carpeting to make sure that it is firmly attached to  the stairs. Fix any carpet that is loose or worn. Avoid having throw rugs at the top or bottom of the stairs. If you do have throw rugs, attach them to the floor with carpet tape. Make sure that you have a light switch at the top of the stairs and the bottom of the stairs. If you do not have them, ask someone to add them for you. What else can I do to help prevent falls? Wear shoes that: Do not have high heels. Have rubber bottoms. Are comfortable and fit you well. Are closed at the toe. Do not wear sandals. If you use a stepladder: Make sure that it is fully opened. Do not climb a closed stepladder. Make sure that both sides of the stepladder are locked into place. Ask someone to hold it for you, if possible. Clearly mark and make sure that you can see: Any grab bars or handrails. First and last steps. Where the edge of each step is. Use tools that help you move around (mobility aids) if they are needed. These  include: Canes. Walkers. Scooters. Crutches. Turn on the lights when you go into a dark area. Replace any light bulbs as soon as they burn out. Set up your furniture so you have a clear path. Avoid moving your furniture around. If any of your floors are uneven, fix them. If there are any pets around you, be aware of where they are. Review your medicines with your doctor. Some medicines can make you feel dizzy. This can increase your chance of falling. Ask your doctor what other things that you can do to help prevent falls. This information is not intended to replace advice given to you by your health care provider. Make sure you discuss any questions you have with your health care provider. Document Released: 05/23/2009 Document Revised: 01/02/2016 Document Reviewed: 08/31/2014 Elsevier Interactive Patient Education  2017 [employer]

## 2022-12-01 NOTE — Progress Notes (Signed)
I connected with  Chidera Thivierge on 12/01/22 by a audio enabled telemedicine application and verified that I am speaking with the correct person using two identifiers.  Patient Location: Home  Provider Location: Office/Clinic  I discussed the limitations of evaluation and management by telemedicine. The patient expressed understanding and agreed to proceed.  Subjective:   Xavier Villegas is a 42 y.o. male who presents for an Initial Medicare Annual Wellness Visit.  Review of Systems     Cardiac Risk Factors include: advanced age (>82men, >81 women);male gender;obesity (BMI >30kg/m2)     Objective:    There were no vitals filed for this visit. There is no height or weight on file to calculate BMI.     12/01/2022    9:23 AM 11/05/2021    9:21 AM 08/28/2021   12:51 PM 03/21/2021    3:43 PM 02/07/2020   10:08 AM 04/26/2019    9:48 AM 09/22/2018   10:17 AM  Advanced Directives  Does Patient Have a Medical Advance Directive? No No No No No No No  Would patient like information on creating a medical advance directive? No - Patient declined  No - Patient declined No - Patient declined No - Guardian declined;No - Patient declined Yes (MAU/Ambulatory/Procedural Areas - Information given) No - Patient declined    Current Medications (verified) Outpatient Encounter Medications as of 12/01/2022  Medication Sig   aspirin 325 MG tablet Take 1 tablet (325 mg total) by mouth daily.   atorvastatin (LIPITOR) 40 MG tablet TAKE 1 TABLET EVERY DAY   baclofen (LIORESAL) 20 MG tablet TAKE 1 TABLET FOUR TIMES DAILY   cholecalciferol (VITAMIN D3) 25 MCG (1000 UT) tablet Take 1,000 Units by mouth daily.   cyanocobalamin 1000 MCG tablet Take 1 tablet (1,000 mcg total) by mouth daily.   folic acid (FOLVITE) 1 MG tablet TAKE 1 TABLET FOUR TIMES DAILY   levETIRAcetam (KEPPRA) 750 MG tablet TAKE 1 TABLET TWICE DAILY   OVER THE COUNTER MEDICATION Take 2 capsules by mouth See admin  instructions. Psyllium fiber 2 capsules by mouth everyday   pantoprazole (PROTONIX) 40 MG tablet Take 40 mg by mouth daily as needed (for GERD).   pyridOXINE (B-6) 50 MG tablet Take 1 tablet (50 mg total) by mouth daily.   No facility-administered encounter medications on file as of 12/01/2022.    Allergies (verified) Patient has no known allergies.   History: Past Medical History:  Diagnosis Date   Aphasia    GERD (gastroesophageal reflux disease)    Hemiparesis 02/2018   right side   Hyperhomocysteinemia    Neurogenic bladder 02/2018   04/26/18 inporving   Prehypertension 07/22/2021   Spastic hemiparesis    right side   Stroke    Left MCA infart   Tobacco abuse    Past Surgical History:  Procedure Laterality Date   CRANIOTOMY Left 02/21/2018   Procedure: DECOMPRESSIVE CRANIECTOMY with bone flap to abdomen;  Surgeon: Donalee Citrin, MD;  Location: [facility] OR;  Service: Neurosurgery;  Laterality: Left;  DECOMPRESSIVE CRANIECTOMY with bone flap to abdomen   CRANIOTOMY N/A 05/02/2018   Procedure: RE-IMPLANTATION OF CRANIAL FLAP;  Surgeon: Donalee Citrin, MD;  Location: [facility] OR;  Service: Neurosurgery;  Laterality: N/A;  RE-IMPLANTATION OF CRANIAL FLAP   IR ANGIO INTRA EXTRACRAN SEL COM CAROTID INNOMINATE UNI R MOD SED  02/19/2018   IR ANGIO VERTEBRAL SEL VERTEBRAL BILAT MOD SED  02/19/2018   IR CT HEAD LTD  02/19/2018   IR PERCUTANEOUS  ART THROMBECTOMY/INFUSION INTRACRANIAL INC DIAG ANGIO  02/19/2018   IR US GUIDE VASC ACCESS RIGHT  02/19/2018   RADIOLOGY WITH ANESTHESIA N/A 02/19/2018   Procedure: IR WITH ANESTHESIA CODE STROKE;  Surgeon: Gilmer Mor, DO;  Location: [facility] OR;  Service: Anesthesiology;  Laterality: N/A;   TEE WITHOUT CARDIOVERSION N/A 03/01/2018   Procedure: TRANSESOPHAGEAL ECHOCARDIOGRAM (TEE);  Surgeon: Thurmon Fair, MD;  Location: [facility] ENDOSCOPY;  Service: Cardiovascular;  Laterality: N/A;   Family History  Problem Relation Age of Onset   Arthritis Mother    Leukemia Mother         CLL/[facility] James Ivanoff [facility]   Hypertension Father    Diabetes Father    Heart disease Maternal Grandmother    Cancer Maternal Grandfather    Liver cancer Paternal Grandmother    Cerebral aneurysm Paternal Grandfather    Social History   Socioeconomic History   Marital status: Divorced    Spouse name: Not on file   Number of children: Not on file   Years of education: Not on file   Highest education level: Not on file  Occupational History   Not on file  Tobacco Use   Smoking status: Former    Packs/day: 0.50    Years: 8.00    Additional pack years: 0.00    Total pack years: 4.00    Types: Cigarettes    Quit date: 02/19/2018    Years since quitting: 4.7   Smokeless tobacco: Never  Vaping Use   Vaping Use: Never used  Substance and Sexual Activity   Alcohol use: Not Currently    Alcohol/week: 14.0 standard drinks of alcohol    Types: 14 Cans of beer per week   Drug use: Never   Sexual activity: Yes  Other Topics Concern   Not on file  Social History Narrative   Not on file   Social Determinants of Health   Financial Resource Strain: Low Risk  (12/01/2022)   Overall Financial Resource Strain (CARDIA)    Difficulty of Paying Living Expenses: Not hard at all  Food Insecurity: No Food Insecurity (12/01/2022)   Hunger Vital Sign    Worried About Running Out of Food in the Last Year: Never true    Ran Out of Food in the Last Year: Never true  Transportation Needs: No Transportation Needs (12/01/2022)   PRAPARE - Administrator, Civil Service (Medical): No    Lack of Transportation (Non-Medical): No  Physical Activity: Insufficiently Active (12/01/2022)   Exercise Vital Sign    Days of Exercise per Week: 7 days    Minutes of Exercise per Session: 10 min  Stress: No Stress Concern Present (12/01/2022)   [employer] of Occupational Health - Occupational Stress Questionnaire    Feeling of Stress : Not at all  Social Connections: Socially Isolated  (12/01/2022)   Social Connection and Isolation Panel [NHANES]    Frequency of Communication with Friends and Family: More than three times a week    Frequency of Social Gatherings with Friends and Family: More than three times a week    Attends Religious Services: Never    Database administrator or Organizations: No    Attends Engineer, structural: Never    Marital Status: Divorced    Tobacco Counseling Counseling given: Not Answered   Clinical Intake:  Pre-visit preparation completed: Yes  Pain : No/denies pain     Nutritional Risks: None Diabetes: No  How often do you need to have  someone help you when you read instructions, pamphlets, or other written materials from your doctor or pharmacy?: 1 - Never  Diabetic?no  Interpreter Needed?: No  Information entered by :: Kennedy Bucker, LPN   Activities of Daily Living    12/01/2022    9:25 AM  In your present state of health, do you have any difficulty performing the following activities:  Hearing? 0  Vision? 0  Difficulty concentrating or making decisions? 0  Walking or climbing stairs? 1  Dressing or bathing? 1  Doing errands, shopping? 1  Preparing Food and eating ? N  Using the Toilet? N  In the past six months, have you accidently leaked urine? N  Do you have problems with loss of bowel control? N  Managing your Medications? Y  Managing your Finances? N  Housekeeping or managing your Housekeeping? Y    Patient Care Team: Larae Grooms, NP as PCP - General (Nurse Practitioner) Wendall Stade, MD as PCP - Cardiology (Cardiology)  Indicate any recent Medical Services you may have received from other than [facility] providers in the past year (date may be approximate).     Assessment:   This is a routine wellness examination for [employer].  Hearing/Vision screen Hearing Screening - Comments:: No aids Vision Screening - Comments:: Peripheral vision in right eye isn't good- wears sunglasses quite a  bit- Dr.Ritter  Dietary issues and exercise activities discussed: Current Exercise Habits: Home exercise routine, Type of exercise: walking, Time (Minutes): 10, Frequency (Times/Week): 7, Weekly Exercise (Minutes/Week): 70, Intensity: Mild   Goals Addressed             This Visit's Progress    DIET - EAT MORE FRUITS AND VEGETABLES         Depression Screen    12/01/2022    9:22 AM 06/24/2022   11:29 AM 03/24/2022    2:25 PM 02/23/2022   11:35 AM 02/11/2022   11:37 AM 01/08/2022    9:54 AM 11/24/2021    9:47 AM  PHQ 2/9 Scores  PHQ - 2 Score 1 0 0 0 0 0 0  PHQ- 9 Score  0 7 5  3 4     Fall Risk    12/01/2022    9:24 AM 06/24/2022   11:30 AM 05/20/2022   10:40 AM 03/24/2022    2:25 PM 02/23/2022   11:35 AM  Fall Risk   Falls in the past year? 1 0 0 0 0  Number falls in past yr: 0 0  0 0  Injury with Fall? 0 0  0 0  Risk for fall due to : History of fall(s);Other (Comment) No Fall Risks  Impaired mobility;Impaired balance/gait No Fall Risks  Risk for fall due to: Comment slipped on black ice      Follow up Falls prevention discussed Falls evaluation completed  Falls evaluation completed Falls evaluation completed    FALL RISK PREVENTION PERTAINING TO THE HOME:  Any stairs in or around the home? Yes  If so, are there any without handrails? No  Home free of loose throw rugs in walkways, pet beds, electrical cords, etc? Yes  Adequate lighting in your home to reduce risk of falls? Yes   ASSISTIVE DEVICES UTILIZED TO PREVENT FALLS:  Life alert? No  Use of a cane, walker or w/c? Yes -cane Grab bars in the bathroom? Yes  Shower chair or bench in shower? Yes  Elevated toilet seat or a handicapped toilet? Yes    Cognitive Function:pt declined  testing 2024        Immunizations Immunization History  Administered Date(s) Administered   Influenza,inj,Quad PF,6+ Mos 05/04/2018, 03/31/2019, 07/22/2021, 06/24/2022   PFIZER(Purple Top)SARS-COV-2 Vaccination 11/13/2019,  12/12/2019   Pneumococcal Polysaccharide-23 03/16/2018    TDAP status: Due, Education has been provided regarding the importance of this vaccine. Advised may receive this vaccine at local pharmacy or Health Dept. Aware to provide a copy of the vaccination record if obtained from local pharmacy or Health Dept. Verbalized acceptance and understanding.  Flu Vaccine status: Up to date  Pneumococcal vaccine status: Up to date  Covid-19 vaccine status: Completed vaccines  Qualifies for Shingles Vaccine? No   Zostavax completed No   Shingrix Completed?: No.    Education has been provided regarding the importance of this vaccine. Patient has been advised to call insurance company to determine out of pocket expense if they have not yet received this vaccine. Advised may also receive vaccine at local pharmacy or Health Dept. Verbalized acceptance and understanding.  Screening Tests Health Maintenance  Topic Date Due   Hepatitis C Screening  Never done   DTaP/Tdap/Td (1 - Tdap) Never done   COVID-19 Vaccine (3 - 2023-24 season) 04/10/2022   INFLUENZA VACCINE  03/11/2023   Medicare Annual Wellness (AWV)  12/01/2023   HIV Screening  Completed   HPV VACCINES  Aged Out    Health Maintenance  Health Maintenance Due  Topic Date Due   Hepatitis C Screening  Never done   DTaP/Tdap/Td (1 - Tdap) Never done   COVID-19 Vaccine (3 - 2023-24 season) 04/10/2022    Lung Cancer Screening: (Low Dose CT Chest recommended if Age 74-80 years, 30 pack-year currently smoking OR have quit w/in 15years.) does not qualify.   Additional Screening:  Hepatitis C Screening: does qualify; Completed no  Vision Screening: Recommended annual ophthalmology exams for early detection of glaucoma and other disorders of the eye. Is the patient up to date with their annual eye exam?  Yes  Who is the provider or what is the name of the office in which the patient attends annual eye exams? Dr.Ritter If pt is not  established with a provider, would they like to be referred to a provider to establish care? No .   Dental Screening: Recommended annual dental exams for proper oral hygiene  Community Resource Referral / Chronic Care Management: CRR required this visit?  No   CCM required this visit?  No      Plan:     I have personally reviewed and noted the following in the patient's chart:   Medical and social history Use of alcohol, tobacco or illicit drugs  Current medications and supplements including opioid prescriptions. Patient is not currently taking opioid prescriptions. Functional ability and status Nutritional status Physical activity Advanced directives List of other physicians Hospitalizations, surgeries, and ER visits in previous 12 months Vitals Screenings to include cognitive, depression, and falls Referrals and appointments  In addition, I have reviewed and discussed with patient certain preventive protocols, quality metrics, and best practice recommendations. A written personalized care plan for preventive services as well as general preventive health recommendations were provided to patient.     Hal Hope, LPN   [id number]/61/0960   Nurse Notes: none

## 2022-12-23 ENCOUNTER — Encounter: Payer: Self-pay | Admitting: Nurse Practitioner

## 2022-12-23 ENCOUNTER — Ambulatory Visit (INDEPENDENT_AMBULATORY_CARE_PROVIDER_SITE_OTHER): Payer: Medicare HMO | Admitting: Nurse Practitioner

## 2022-12-23 VITALS — BP 130/80 | HR 88 | Temp 97.9°F | Wt 266.5 lb

## 2022-12-23 DIAGNOSIS — G8191 Hemiplegia, unspecified affecting right dominant side: Secondary | ICD-10-CM | POA: Diagnosis not present

## 2022-12-23 DIAGNOSIS — E538 Deficiency of other specified B group vitamins: Secondary | ICD-10-CM

## 2022-12-23 DIAGNOSIS — E785 Hyperlipidemia, unspecified: Secondary | ICD-10-CM

## 2022-12-23 MED ORDER — ATORVASTATIN CALCIUM 40 MG PO TABS
40.0000 mg | ORAL_TABLET | Freq: Every day | ORAL | 1 refills | Status: DC
Start: 2022-12-23 — End: 2023-02-09

## 2022-12-23 NOTE — Assessment & Plan Note (Addendum)
Doing well.  Walks with a cane.  Followed by physical medicine who manages his baclofen and Keppra.

## 2022-12-23 NOTE — Assessment & Plan Note (Signed)
Labs ordered at visit today.  Will make recommendations based on lab results.   

## 2022-12-23 NOTE — Assessment & Plan Note (Signed)
Chronic.  Controlled.  Continue with current medication regimen of Atorvastatin daily.  Labs ordered today.  Return to clinic in 6 months for reevaluation.  Call sooner if concerns arise.   

## 2022-12-23 NOTE — Progress Notes (Signed)
BP 130/80 Comment: home blood pressure reading  Pulse 88   Temp 97.9 F (36.6 C) (Oral)   Wt 266 lb 8 oz (120.9 kg)   SpO2 99%   BMI 39.36 kg/m    Subjective:    Patient ID: Xavier Villegas, male    DOB: 07/10/1981, 42 y.o.   MRN: [id number]  HPI: Xavier Villegas is a 42 y.o. male  Chief Complaint  Patient presents with   Hyperlipidemia   Hypertension   HYPERLIPIDEMIA Hyperlipidemia status: excellent compliance Satisfied with current treatment?  yes Side effects:  no Medication compliance: excellent compliance Past cholesterol meds: atorvastain (lipitor) Supplements: none Aspirin:  yes The ASCVD Risk score (Arnett DK, et al., 2019) failed to calculate for the following reasons:   The patient has a prior MI or stroke diagnosis Chest pain:  no Coronary artery disease:  no Family history CAD:  no Family history early CAD:  no  ELEVATED BLOOD PRESSURE Duration of elevated BP: unknown BP monitoring frequency: daily BP range: 130/80 Previous BP meds: no Recent stressors: no Family history of hypertension: yes Recurrent headaches: no Visual changes: no Palpitations: no  Dyspnea: no Chest pain: no Lower extremity edema: no Dizzy/lightheaded: no Transient ischemic attacks: yes   Relevant past medical, surgical, family and social history reviewed and updated as indicated. Interim medical history since our last visit reviewed. Allergies and medications reviewed and updated.  Review of Systems  Eyes:  Negative for visual disturbance.  Respiratory:  Negative for shortness of breath.   Cardiovascular:  Negative for chest pain and leg swelling.  Neurological:  Negative for light-headedness and headaches.    Per HPI unless specifically indicated above     Objective:    BP 130/80 Comment: home blood pressure reading  Pulse 88   Temp 97.9 F (36.6 C) (Oral)   Wt 266 lb 8 oz (120.9 kg)   SpO2 99%   BMI 39.36 kg/m   Wt Readings from Last 3  Encounters:  12/23/22 266 lb 8 oz (120.9 kg)  12/01/22 267 lb (121.1 kg)  11/25/22 267 lb 12.8 oz (121.5 kg)    Physical Exam Vitals and nursing note reviewed.  Constitutional:      General: He is not in acute distress.    Appearance: Normal appearance. He is not ill-appearing, toxic-appearing or diaphoretic.  HENT:     Head: Normocephalic.     Right Ear: External ear normal.     Left Ear: External ear normal.     Nose: Nose normal. No congestion or rhinorrhea.     Mouth/Throat:     Mouth: Mucous membranes are moist.  Eyes:     General:        Right eye: No discharge.        Left eye: No discharge.     Extraocular Movements: Extraocular movements intact.     Conjunctiva/sclera: Conjunctivae normal.     Pupils: Pupils are equal, round, and reactive to light.  Cardiovascular:     Rate and Rhythm: Normal rate and regular rhythm.     Heart sounds: No murmur heard. Pulmonary:     Effort: Pulmonary effort is normal. No respiratory distress.     Breath sounds: Normal breath sounds. No wheezing, rhonchi or rales.  Abdominal:     General: Abdomen is flat. Bowel sounds are normal.  Musculoskeletal:     Cervical back: Normal range of motion and neck supple.  Skin:    General: Skin is warm and  dry.     Capillary Refill: Capillary refill takes less than 2 seconds.  Neurological:     General: No focal deficit present.     Mental Status: He is alert and oriented to person, place, and time.  Psychiatric:        Mood and Affect: Mood normal.        Behavior: Behavior normal.        Thought Content: Thought content normal.        Judgment: Judgment normal.     Results for orders placed or performed in visit on 06/24/22  Lipid Profile  Result Value Ref Range   Cholesterol, Total 117 100 - 199 mg/dL   Triglycerides 83 0 - 149 mg/dL   HDL 37 (L) >16 mg/dL   VLDL Cholesterol Cal 16 5 - 40 mg/dL   LDL Chol Calc (NIH) 64 0 - 99 mg/dL   Chol/HDL Ratio 3.2 0.0 - 5.0 ratio  Comp Met  (CMET)  Result Value Ref Range   Glucose 74 70 - 99 mg/dL   BUN 10 6 - 24 mg/dL   Creatinine, Ser 1.09 0.76 - 1.27 mg/dL   eGFR 604 >54 UJ/WJX/9.14   BUN/Creatinine Ratio 11 9 - 20   Sodium 141 134 - 144 mmol/L   Potassium 4.1 3.5 - 5.2 mmol/L   Chloride 99 96 - 106 mmol/L   CO2 24 20 - 29 mmol/L   Calcium 9.5 8.7 - 10.2 mg/dL   Total Protein 7.5 6.0 - 8.5 g/dL   Albumin 4.7 4.1 - 5.1 g/dL   Globulin, Total 2.8 1.5 - 4.5 g/dL   Albumin/Globulin Ratio 1.7 1.2 - 2.2   Bilirubin Total 0.7 0.0 - 1.2 mg/dL   Alkaline Phosphatase 157 (H) 44 - 121 IU/L   AST 24 0 - 40 IU/L   ALT 40 0 - 44 IU/L  CBC w/Diff  Result Value Ref Range   WBC 10.9 (H) 3.4 - 10.8 x10E3/uL   RBC 5.30 4.14 - 5.80 x10E6/uL   Hemoglobin 17.2 13.0 - 17.7 g/dL   Hematocrit 78.2 95.6 - 51.0 %   MCV 90 79 - 97 fL   MCH 32.5 26.6 - 33.0 pg   MCHC 36.1 (H) 31.5 - 35.7 g/dL   RDW 21.3 08.6 - 57.8 %   Platelets 242 150 - 450 x10E3/uL   Neutrophils 61 Not Estab. %   Lymphs 30 Not Estab. %   Monocytes 6 Not Estab. %   Eos 2 Not Estab. %   Basos 1 Not Estab. %   Neutrophils Absolute 6.7 1.4 - 7.0 x10E3/uL   Lymphocytes Absolute 3.3 (H) 0.7 - 3.1 x10E3/uL   Monocytes Absolute 0.7 0.1 - 0.9 x10E3/uL   EOS (ABSOLUTE) 0.2 0.0 - 0.4 x10E3/uL   Basophils Absolute 0.1 0.0 - 0.2 x10E3/uL   Immature Granulocytes 0 Not Estab. %   Immature Grans (Abs) 0.0 0.0 - 0.1 x10E3/uL  B12  Result Value Ref Range   Vitamin B-12 978 232 - 1,245 pg/mL  Vitamin B6  Result Value Ref Range   Vitamin B6 49.7 3.4 - 65.2 ug/L  Vitamin D (25 hydroxy)  Result Value Ref Range   Vit D, 25-Hydroxy 45.1 30.0 - 100.0 ng/mL      Assessment & Plan:   Problem List Items Addressed This Visit       Nervous and Auditory   Right hemiparesis (HCC) - Primary    Doing well.  Walks with a cane.  Followed by  physical medicine who manages his baclofen and Keppra.      Relevant Orders   Comp Met (CMET)     Other   B12 deficiency    Labs ordered  at visit today.  Will make recommendations based on lab results.        Relevant Orders   B12   Hyperlipidemia    Chronic.  Controlled.  Continue with current medication regimen of Atorvastatin daily.  Labs ordered today.  Return to clinic in 6 months for reevaluation.  Call sooner if concerns arise.        Relevant Medications   atorvastatin (LIPITOR) 40 MG tablet   Other Relevant Orders   Lipid Profile     Follow up plan: Return in about 6 months (around 06/25/2023) for Physical and Fasting labs.

## 2022-12-24 LAB — COMPREHENSIVE METABOLIC PANEL
ALT: 35 IU/L (ref 0–44)
AST: 24 IU/L (ref 0–40)
Albumin/Globulin Ratio: 1.6 (ref 1.2–2.2)
Albumin: 4.4 g/dL (ref 4.1–5.1)
Alkaline Phosphatase: 146 IU/L — ABNORMAL HIGH (ref 44–121)
BUN/Creatinine Ratio: 8 — ABNORMAL LOW (ref 9–20)
BUN: 8 mg/dL (ref 6–24)
Bilirubin Total: 0.8 mg/dL (ref 0.0–1.2)
CO2: 25 mmol/L (ref 20–29)
Calcium: 9.6 mg/dL (ref 8.7–10.2)
Chloride: 100 mmol/L (ref 96–106)
Creatinine, Ser: 0.95 mg/dL (ref 0.76–1.27)
Globulin, Total: 2.8 g/dL (ref 1.5–4.5)
Glucose: 85 mg/dL (ref 70–99)
Potassium: 4.8 mmol/L (ref 3.5–5.2)
Sodium: 141 mmol/L (ref 134–144)
Total Protein: 7.2 g/dL (ref 6.0–8.5)
eGFR: 104 mL/min/{1.73_m2} (ref >59)

## 2022-12-24 LAB — LIPID PANEL
Chol/HDL Ratio: 2.7 ratio (ref 0.0–5.0)
Cholesterol, Total: 101 mg/dL (ref 100–199)
HDL: 37 mg/dL — ABNORMAL LOW (ref >39)
LDL Chol Calc (NIH): 45 mg/dL (ref 0–99)
Triglycerides: 99 mg/dL (ref 0–149)
VLDL Cholesterol Cal: 19 mg/dL (ref 5–40)

## 2022-12-24 LAB — VITAMIN B12: Vitamin B-12: 1033 pg/mL (ref 232–1245)

## 2022-12-24 NOTE — Progress Notes (Signed)
Good Morning. It was nice to see you yesterday.  Your lab work looks good.  No concerns at this time. Continue with your current medication regimen.  Follow up as discussed.  Please let me know if you have any questions.  

## 2023-01-11 DIAGNOSIS — I69351 Hemiplegia and hemiparesis following cerebral infarction affecting right dominant side: Secondary | ICD-10-CM | POA: Diagnosis not present

## 2023-01-25 ENCOUNTER — Telehealth: Payer: Self-pay | Admitting: Nurse Practitioner

## 2023-01-25 NOTE — Telephone Encounter (Signed)
Patients father Xavier Villegas came into office requesting a summons for jury duty excuse form to be completed. Informed Xavier Villegas to allow provider Larae Grooms, NP 48-72 hours for completion. Father ask for phone call upon completion. [phone]. Placed form in providers box.

## 2023-01-26 NOTE — Telephone Encounter (Signed)
Placed paperwork in providers box  

## 2023-01-27 NOTE — Telephone Encounter (Signed)
Okay to write letter for patient.  The form needs to be filled out for patient.  He should he excused due to history of stroke.

## 2023-01-29 ENCOUNTER — Encounter: Payer: Self-pay | Admitting: Nurse Practitioner

## 2023-01-29 NOTE — Telephone Encounter (Signed)
Pt called in , I let him know of the notes that note is completed and he has to do the paperwork. He will pick this up today.

## 2023-01-29 NOTE — Telephone Encounter (Signed)
Letter has been written for the patient and signed by Clydie Braun. Paperwork that was dropped off is to be filled out by the patient.   Called and LVM asking for patient's father to please return my call. Paperwork placed in the completed bin to be picked up along with the letter.

## 2023-01-30 ENCOUNTER — Other Ambulatory Visit: Payer: Self-pay | Admitting: Nurse Practitioner

## 2023-01-30 DIAGNOSIS — E785 Hyperlipidemia, unspecified: Secondary | ICD-10-CM

## 2023-02-01 NOTE — Telephone Encounter (Signed)
Requested Prescriptions  Refused Prescriptions Disp Refills   atorvastatin (LIPITOR) 40 MG tablet [Pharmacy Med Name: ATORVASTATIN CALCIUM 40 MG Tablet] 90 tablet 3    Sig: TAKE 1 TABLET EVERY DAY     Cardiovascular:  Antilipid - Statins Failed - 01/30/2023  3:56 AM      Failed - Lipid Panel in normal range within the last 12 months    Cholesterol, Total  Date Value Ref Range Status  12/23/2022 101 100 - 199 mg/dL Final   LDL Chol Calc (NIH)  Date Value Ref Range Status  12/23/2022 45 0 - 99 mg/dL Final   HDL  Date Value Ref Range Status  12/23/2022 37 (L) >39 mg/dL Final   Triglycerides  Date Value Ref Range Status  12/23/2022 99 0 - 149 mg/dL Final         Passed - Patient is not pregnant      Passed - Valid encounter within last 12 months    Recent Outpatient Visits           1 month ago Right hemiparesis [facility])   [place] [facility] Larae Grooms, NP   7 months ago Hyperlipidemia, unspecified hyperlipidemia type   [place] [facility] Mecum, Oswaldo Conroy, PA-C   10 months ago Prehypertension   [place] [address] [facility] Mecum, Oswaldo Conroy, PA-C   11 months ago Prehypertension   [place] [facility] Mecum, Oswaldo Conroy, PA-C   1 year ago Prehypertension   [place] [facility] [name], Avanti, MD       Future Appointments             In 4 months Larae Grooms, NP [place] [facility]

## 2023-02-08 ENCOUNTER — Telehealth: Payer: Self-pay | Admitting: Nurse Practitioner

## 2023-02-08 DIAGNOSIS — E785 Hyperlipidemia, unspecified: Secondary | ICD-10-CM

## 2023-02-08 NOTE — Telephone Encounter (Signed)
Pt's father is calling in because pt's medication atorvastatin (LIPITOR) 40 MG tablet [[id number]] was denied for a refill. Iantha Fallen (Pt's father) wants to know why was it denied and does pt still need to take this medication. Please follow up with Iantha Fallen.

## 2023-02-09 MED ORDER — ATORVASTATIN CALCIUM 40 MG PO TABS
40.0000 mg | ORAL_TABLET | Freq: Every day | ORAL | 1 refills | Status: DC
Start: 2023-02-09 — End: 2023-07-15

## 2023-02-09 NOTE — Telephone Encounter (Signed)
Medication sent to [facility].

## 2023-02-09 NOTE — Telephone Encounter (Signed)
Contacted [facility] as RX was sent 12/23/22 for #90 with 1 refill. Medication is at [facility] but contacted patient's father and he would like the RX to be sent to mail order instead. RX t'd up to be sent in as requested.

## 2023-02-24 ENCOUNTER — Encounter: Payer: Medicare HMO | Attending: Physical Medicine & Rehabilitation | Admitting: Physical Medicine & Rehabilitation

## 2023-02-24 ENCOUNTER — Encounter: Payer: Self-pay | Admitting: Physical Medicine & Rehabilitation

## 2023-02-24 VITALS — BP 131/89 | HR 70 | Ht 69.0 in | Wt 266.0 lb

## 2023-02-24 DIAGNOSIS — G8110 Spastic hemiplegia affecting unspecified side: Secondary | ICD-10-CM | POA: Insufficient documentation

## 2023-02-24 MED ORDER — ABOBOTULINUMTOXINA 300 UNITS IM SOLR
300.0000 [IU] | Freq: Once | INTRAMUSCULAR | Status: DC
Start: 2023-02-24 — End: 2023-02-24

## 2023-02-24 MED ORDER — SODIUM CHLORIDE (PF) 0.9 % IJ SOLN
4.0000 mL | Freq: Once | INTRAMUSCULAR | Status: AC
Start: 2023-02-24 — End: 2023-02-24
  Administered 2023-02-24: 4 mL

## 2023-02-24 MED ORDER — ABOBOTULINUMTOXINA 300 UNITS IM SOLR
300.0000 [IU] | Freq: Once | INTRAMUSCULAR | Status: AC
Start: 2023-02-24 — End: 2023-02-24
  Administered 2023-02-24: 300 [IU] via INTRAMUSCULAR

## 2023-02-24 MED ORDER — ABOBOTULINUMTOXINA 500 UNITS IM SOLR
500.0000 [IU] | Freq: Once | INTRAMUSCULAR | Status: AC
Start: 2023-02-24 — End: 2023-02-24
  Administered 2023-02-24: 500 [IU] via INTRAMUSCULAR

## 2023-02-24 NOTE — Progress Notes (Signed)
Dysport Injection for spasticity of upper extremity using needle EMG guidance Indication: Spastic hemiparesis affecting dominant side (HCC) - Plan: AbobotulinumtoxinA (DYSPORT) 500 units injection 500 Units, sodium chloride (PF) 0.9 % injection 4 mL, abobotulinumtoxinA (DYSPORT) 300 units injection 300 Units, DISCONTINUED: abobotulinumtoxinA (DYSPORT) 300 units injection 300 Units G81.10  Dilution: 500 Units/76ml        Total Units Injected: 800 Indication: Severe spasticity which interferes with ADL,mobility and/or  hygiene and is unresponsive to medication management and other conservative care Informed consent was obtained after describing risks and benefits of the procedure with the patient. This includes bleeding, bruising, infection, excessive weakness, or medication side effects. A REMS form is on file and signed.  Needle: 50mm injectable monopolar needle electrode    Number of units per muscle Pectoralis Major 0 units Pectoralis Minor 0 units Biceps 0 units Brachioradialis 0 units FCR 25 units FCU 25 units FDS 200 units FDP 200 units FPL 50 units Palmaris Longus 0 units Pronator Teres 150 units Pronator Quadratus 50 units APB/FPB 100 Lumbricals 0 units All injections were done after obtaining appropriate EMG activity and after negative drawback for blood. The patient tolerated the procedure well. Post procedure instructions were given. Return in about 3 months (around 05/27/2023) for 800 units dysport RUE.

## 2023-02-24 NOTE — Patient Instructions (Signed)
 ALWAYS FEEL FREE TO CALL OUR OFFICE WITH ANY PROBLEMS OR QUESTIONS ([phone])  **PLEASE NOTE** ALL MEDICATION REFILL REQUESTS (INCLUDING CONTROLLED SUBSTANCES) NEED TO BE MADE AT LEAST 7 DAYS PRIOR TO REFILL BEING DUE. ANY REFILL REQUESTS INSIDE THAT TIME FRAME MAY RESULT IN DELAYS IN RECEIVING YOUR PRESCRIPTION.                    

## 2023-02-25 ENCOUNTER — Ambulatory Visit: Payer: Medicare HMO | Admitting: Adult Health

## 2023-02-25 ENCOUNTER — Encounter: Payer: Self-pay | Admitting: Adult Health

## 2023-02-25 VITALS — BP 140/92 | HR 73 | Ht 70.0 in | Wt 264.0 lb

## 2023-02-25 DIAGNOSIS — Z8673 Personal history of transient ischemic attack (TIA), and cerebral infarction without residual deficits: Secondary | ICD-10-CM

## 2023-02-25 DIAGNOSIS — R569 Unspecified convulsions: Secondary | ICD-10-CM

## 2023-02-25 DIAGNOSIS — I69398 Other sequelae of cerebral infarction: Secondary | ICD-10-CM

## 2023-02-25 NOTE — Patient Instructions (Addendum)
Continue keppra 750mg  twice daily for seizure prevention  Continue aspirin 81 mg daily  and atorvastatin  for secondary stroke prevention  Continue to follow up with PCP regarding blood pressure and cholesterol management  Maintain strict control of hypertension with blood pressure goal below 130/90 and cholesterol with LDL cholesterol (bad cholesterol) goal below 70 mg/dL.   Signs of a Stroke? Follow the BEFAST method:  Balance Watch for a sudden loss of balance, trouble with coordination or vertigo Eyes Is there a sudden loss of vision in one or both eyes? Or double vision?  Face: Ask the person to smile. Does one side of the face droop or is it numb?  Arms: Ask the person to raise both arms. Does one arm drift downward? Is there weakness or numbness of a leg? Speech: Ask the person to repeat a simple phrase. Does the speech sound slurred/strange? Is the person confused ? Time: If you observe any of these signs, call 911.     Followup in the future with me in 1 year or call earlier if needed      Thank you for coming to see [place] at [facility]. I hope we have been able to provide you high quality care today.  You may receive a patient satisfaction survey over the next few weeks. We would appreciate your feedback and comments so that we may continue to improve ourselves and the health of our patients.

## 2023-02-25 NOTE — Progress Notes (Signed)
[facility] [address] [place]. [place] [address] [phone]       OFFICE FOLLOW UP NOTE  Xavier. Xavier Villegas Date of Birth:  Sep 06, 1980 Medical Record Number:  [id number]   Reason for visit: stroke and seizure follow up  CHIEF COMPLAINT:  Chief Complaint  Patient presents with   Follow-up    Rm 8, here with father Xavier Villegas Pt is here for yearly stroke and seizure follow up. No recent seizure activity.     HPI:  Update 02/25/2023 JM: Patient returns for yearly stroke and seizure follow-up accompanied by his father.  Has been stable since prior visit without any new stroke/TIA symptoms and denies any seizure activity with ongoing use of Keppra.  Continue poststroke right spastic hemiparesis expressive aphasia visual impairment stable.  Continues to follow with PMR Dr. Riley Kill receiving regular Dysport injections with benefit.  Ambulates with cane outdoors, does not use indoors, denies any recent falls.  Continues on aspirin and atorvastatin.  Routinely follows with PCP for stroke risk factor management.  No questions or concerns at this time.    History provided for reference purposes only Update 02/23/2022 JM: Patient returns for yearly stroke and seizure follow-up accompanied by his father.  Overall stable since prior visit.  Reports stable residual right spastic hemiparesis, expressive aphasia and visual impairment.  Continues to follow with Dr. Riley Kill for botox.  He is looking in to Vivistim symptoms but father questions safety and use of Neuralink, a brain chip by [employer] recently approved to start trials in human subjects. He is currently investigating this further as he has read it can help paralysis, speech impairment and visual impairment. Denies new stroke/TIA symptoms.  No recent seizure activity.  Compliant on Keppra, denies side effects.  Compliant on aspirin, denies side effects.  Blood pressure today 141/85.  No new concerns at this  time.  Update 02/27/2021 JM: Xavier Villegas returns for yearly stroke and seizure follow-up accompanied by his mother and father.  He has been stable since prior visit without new stroke/TIA symptoms or seizure activity.  Residual stroke deficits stable without worsening.  Continues to receive Botox by Dr. Riley Kill PMR for right spastic hemiparesis with last injection on 5/4.  He has also been working with [facility] in [place], [place] with noted improvement of strength and spasticity.  Per father, his speech is also been improving at PT works with him on language exercises as well.  Compliant on aspirin without associated side effects.  Compliant on Keppra 750 mg twice daily tolerating without side effects.  Blood pressure today 146/88.  No new concerns at this time.  Update 02/27/2020 JM: Xavier Villegas returns for 64-month follow-up accompanied by his father.  Stable residual deficits of right spastic hemiparesis, expressive aphasia and visual impairment.  Denies new stroke/TIA symptoms.  Continues to receive Botox injections by PMR for spasticity.  No recurrent seizure activity and continues on Keppra 750 mg twice daily tolerating well without side effects.  Continues on aspirin 325 mg daily without bleeding or bruising.  Blood pressure today 133/82.  Remains on folic acid, B12 and B6 for management of hyper homocystinemia.  Recent lab work satisfactory.  No concerns at this time.  Update 08/29/2019: Xavier Villegas is a 42 year old male who is being seen today for stroke and seizure follow-up.  He has been stable from a neurological standpoint with residual stroke deficits of right spastic hemiparesis, severe expressive aphasia and right homonymous hemianopia.  He was previously working  with PT but this has been placed on hold back in 05/2019 due to extensive dental procedure.  He continues to do exercises at home which were recommended by therapy as well as father helping with range of motion to help with spasticity.   He recently received Botox injections by Dr. Riley Kill at [facility] but this also has been placed on hold due to financial and insurance reasons.  His mother does endorse ongoing improvement of right hemiparesis with improvement of gait and at times is able to ambulate without AFO brace as well as increased strength of RUE.  He has not had any reoccurring seizure activity and continues on Keppra 750 mg twice daily tolerating well without side effects.  Continues on aspirin 325 mg daily without bleeding or bruising for secondary stroke prevention.  Blood pressure today 130/88.  He was also continued on B12, B6 and folate which was initiated during hospitalization due to evidence of hyperhomocystinemia, B12 deficiency folic acid deficiency.  Repeat labs have not been performed since hospitalization.  No further concerns at this time.  Update 02/23/2019: Xavier Villegas is a 42 year old male who is being seen today for follow-up regarding seizure activity on 10/15/2018 likely secondary to prior left MCA infarct.  He did undergo EEG on 10/31/2018 which did not show evidence of seizure activity.  He denies any recurrent seizure activity and continues on Keppra 750 mg twice daily without reported side effects. Underwent repeat carotid ultrasound on 10/27/2018 which showed bilateral ICA stenosis of 1 to 39% and VAs antegrade flow.  Residual stroke deficits right spastic hemiparesis, severe expressive aphasia and right homonymous hemianopsia stable. Continues to work with therapy thru [employer] [facility].  He was previously working with speech therapy at [facility] but has since been completed and is currently attempting to obtain speech machine.  Insurance will start paying for outpatient therapy again after 03/11/19. Blood pressure 132/91 which is normal for patient. Continues on aspirin 325mg  daily without bleeding or bruising. No further concerns at this time.   10/25/2018 visit: Xavier Villegas is a 42 year old male who is being seen  today for follow-up visit after admission to ED on 10/15/2018 after witnessed seizure activity with history of left MCA infarct with small ICH/SDH hemorrhagic conversion s/p craniotomy.  Per ED notes, consisted of "moving his right arm, sticking his tongue out and convulsing for approximately 2 to 3 minutes along with incontinence episode per mother".  Reported postictal state per mother.  He ultimately had recurrent seizure during admission lasting for less than 2 minutes.  CT head reviewed and was negative for acute intracranial abnormality and showed stable chronic large region of encephalomalacia in the left MCA territory with small chronic left frontal extra-axial collection deep to the left craniotomy. initiated Keppra 750 mg twice daily at discharge for seizure prophylaxis.  He denies any recurrent seizure activity since this time.  He has tolerated Keppra without reported side effects.  He continues to have residual stroke deficits of right spastic hemiparesis, severe expressive aphasia and right homonymous hemianopia.  No further concerns at this time.  He does continue to take aspirin 325 mg without reported side effects of bleeding or bruising.  Blood pressure today satisfactory at 128/81.  He continues to live at home but is able to maintain ADLs independently.  He was previously participating at [employer] for physical and speech therapy as his insurance will not approve additional therapy sessions at this time.  Unfortunately, due to recent pandemic, these colleges have close  at this time but he plans on restarting once able.  He returns today for reevaluation and follow-up regarding recent seizure activity.   History 08/24/2018: Patient is being seen today for 52-month follow-up visit and is accompanied by his mother.  He has been doing well since prior visit with continued post stroke deficits of right spastic hemiparesis, severe expressive aphasia and right homonymous hemianopia. He continues to  work with PT/OT at our neuro rehab clinic and does endorse some improvement of his hemiparesis.  He currently is ambulating with a cane but typically will not use the cane in his home unless he is going up steps.  He is currently on baclofen to assist with spasticity and he does endorse benefit from this.  He is unable to participate in further ST sessions due to insurance reasons and plans on looking into volunteering at a speech therapy school in hopes of obtaining additional ST.  He does follow with ophthalmology for peripheral vision exams and per mother, these have been stable.  He continues on aspirin 325 mg without side effects of bleeding or bruising.  Blood pressure today satisfactory 130/85.  No further concerns at this time.  Denies new or worsening stroke/TIA symptoms.  05/17/2018 visit: Patient is being seen today for hospital follow up and is accompanied by his aunt. Underwent re-implantation of cranial flap on 05/02/18 which was tolerated well without complication.  He continues to have right hemiparesis with spasticity and expressive aphasia but does feel as though all have been improving.  He participates at our neuro rehab clinic where he receives PT/OT/ST.  He is able to ambulate with quad cane and use of foot drop brace but denies any recent falls.  He is able to say a few words but for the most part nods head appropriately.  He also continues to have right homonymous hemianopia and is unsure if there has been any improvement.  He was discharged from hospital stay on aspirin 325 and has continued to take this without side effects of bleeding or bruising.  Blood pressure today satisfactory at 125/80.  He currently is living with his father for continued assistance but is able to bathe and dress himself independently.  Patient appears to be in good spirits regarding recovery progress and the future.  Denies any depression-like symptoms or anxiety.  No further concerns at this time.  Denies new or  worsening stroke/TIA symptoms.  03/01/2018 hospital admission: Xavier Villegas is a 42 y.o. male with no significant past medical history other than tobacco use who presented with right-sided weakness and aphasia. He did not receive IV t-PA due to late presentation.  CT head reviewed and showed large territory acute infarct left MCA territory.  Due to these findings, he was taken to MRI for consideration of IR if his infarct burden appears less than estimated by CTP but unfortunately due to other patient care issues there would have been a significant delay therefore patient was taken to IR for intervention.  Patient underwent attempted thrombectomy for occlusive left ICA which resulted in left ICA dissection and possible ICH.  Postprocedure CT showed subarachnoid hemorrhage with hemorrhagic transformation.  MRI head reviewed and showed large left MCA infarct with small ICH/SDH hemorrhagic conversion.  MRA head showed left ICA and MCA occlusion.  CTA head and neck showed acute dissection left internal carotid artery with intraluminal thrombus along with 2 mm midline shift.  Repeat CT showed progressive cytotoxic edema with slight mass-effect and midline shift where  he required left decompressive hemicraniotomy as well as hypertonic saline for management of edema. Repeat CT scan showed improvement after treatment. Lower extremity venous Dopplers were negative for DVT.  Carotid Doppler showed left ICA occlusion.  2D echo showed an EF of 55 to 60% with aortic valve mass versus vegetation.  TEE was normal without evidence of vegetation.  Hypercoagulable work-up did show high homocystine at 181.7 and he was started on high-dose folate and B6 as well as B12 injection.  LDL 46 and A1c 4.9.  Patient was discharged to [facility] for continuation of therapy in stable condition. Patient was discharged home on 04/06/18 with 24 hr supervision.       ROS:   14 system review of systems performed and negative with  exception of see HPI    PMH:  Past Medical History:  Diagnosis Date   Aphasia    GERD (gastroesophageal reflux disease)    Hemiparesis (HCC) 02/2018   right side   Hyperhomocysteinemia (HCC)    Neurogenic bladder 02/2018   04/26/18 inporving   Prehypertension 07/22/2021   Spastic hemiparesis (HCC)    right side   Stroke (HCC)    Left MCA infart   Tobacco abuse     PSH:  Past Surgical History:  Procedure Laterality Date   CRANIOTOMY Left 02/21/2018   Procedure: DECOMPRESSIVE CRANIECTOMY with bone flap to abdomen;  Surgeon: Donalee Citrin, MD;  Location: [facility] OR;  Service: Neurosurgery;  Laterality: Left;  DECOMPRESSIVE CRANIECTOMY with bone flap to abdomen   CRANIOTOMY N/A 05/02/2018   Procedure: RE-IMPLANTATION OF CRANIAL FLAP;  Surgeon: Donalee Citrin, MD;  Location: [facility] OR;  Service: Neurosurgery;  Laterality: N/A;  RE-IMPLANTATION OF CRANIAL FLAP   IR ANGIO INTRA EXTRACRAN SEL COM CAROTID INNOMINATE UNI R MOD SED  02/19/2018   IR ANGIO VERTEBRAL SEL VERTEBRAL BILAT MOD SED  02/19/2018   IR CT HEAD LTD  02/19/2018   IR PERCUTANEOUS ART THROMBECTOMY/INFUSION INTRACRANIAL INC DIAG ANGIO  02/19/2018   IR US GUIDE VASC ACCESS RIGHT  02/19/2018   RADIOLOGY WITH ANESTHESIA N/A 02/19/2018   Procedure: IR WITH ANESTHESIA CODE STROKE;  Surgeon: Gilmer Mor, DO;  Location: [facility] OR;  Service: Anesthesiology;  Laterality: N/A;   TEE WITHOUT CARDIOVERSION N/A 03/01/2018   Procedure: TRANSESOPHAGEAL ECHOCARDIOGRAM (TEE);  Surgeon: Thurmon Fair, MD;  Location: [facility] ENDOSCOPY;  Service: Cardiovascular;  Laterality: N/A;    Social History:  Social History   Socioeconomic History   Marital status: Divorced    Spouse name: Not on file   Number of children: Not on file   Years of education: Not on file   Highest education level: Not on file  Occupational History   Not on file  Tobacco Use   Smoking status: Former    Current packs/day: 0.00    Average packs/day: 0.5 packs/day for 8.0 years (4.0 ttl  pk-yrs)    Types: Cigarettes    Start date: 02/19/2010    Quit date: 02/19/2018    Years since quitting: 5.0   Smokeless tobacco: Never  Vaping Use   Vaping status: Never Used  Substance and Sexual Activity   Alcohol use: Not Currently    Alcohol/week: 14.0 standard drinks of alcohol    Types: 14 Cans of beer per week   Drug use: Never   Sexual activity: Yes  Other Topics Concern   Not on file  Social History Narrative   Not on file   Social Determinants of Health   Financial Resource Strain: Low  Risk  (12/01/2022)   Overall Financial Resource Strain (CARDIA)    Difficulty of Paying Living Expenses: Not hard at all  Food Insecurity: No Food Insecurity (12/01/2022)   Hunger Vital Sign    Worried About Running Out of Food in the Last Year: Never true    Ran Out of Food in the Last Year: Never true  Transportation Needs: No Transportation Needs (12/01/2022)   PRAPARE - Administrator, Civil Service (Medical): No    Lack of Transportation (Non-Medical): No  Physical Activity: Insufficiently Active (12/01/2022)   Exercise Vital Sign    Days of Exercise per Week: 7 days    Minutes of Exercise per Session: 10 min  Stress: No Stress Concern Present (12/01/2022)   [employer] of Occupational Health - Occupational Stress Questionnaire    Feeling of Stress : Not at all  Social Connections: Socially Isolated (12/01/2022)   Social Connection and Isolation Panel [NHANES]    Frequency of Communication with Friends and Family: More than three times a week    Frequency of Social Gatherings with Friends and Family: More than three times a week    Attends Religious Services: Never    Database administrator or Organizations: No    Attends Banker Meetings: Never    Marital Status: Divorced  Catering manager Violence: Not At Risk (12/01/2022)   Humiliation, Afraid, Rape, and Kick questionnaire    Fear of Current or Ex-Partner: No    Emotionally Abused: No     Physically Abused: No    Sexually Abused: No    Family History:  Family History  Problem Relation Age of Onset   Arthritis Mother    Leukemia Mother        CLL/Dr. James Ivanoff at [facility]   Hypertension Father    Diabetes Father    Heart disease Maternal Grandmother    Cancer Maternal Grandfather    Liver cancer Paternal Grandmother    Cerebral aneurysm Paternal Grandfather     Medications:   Current Outpatient Medications on File Prior to Visit  Medication Sig Dispense Refill   aspirin 325 MG tablet Take 1 tablet (325 mg total) by mouth daily.     atorvastatin (LIPITOR) 40 MG tablet Take 1 tablet (40 mg total) by mouth daily. 90 tablet 1   baclofen (LIORESAL) 20 MG tablet TAKE 1 TABLET FOUR TIMES DAILY 360 tablet 3   cholecalciferol (VITAMIN D3) 25 MCG (1000 UT) tablet Take 1,000 Units by mouth daily.     cyanocobalamin 1000 MCG tablet Take 1 tablet (1,000 mcg total) by mouth daily. 30 tablet 2   folic acid (FOLVITE) 1 MG tablet TAKE 1 TABLET FOUR TIMES DAILY 360 tablet 9   levETIRAcetam (KEPPRA) 750 MG tablet TAKE 1 TABLET TWICE DAILY 180 tablet 9   OVER THE COUNTER MEDICATION Take 2 capsules by mouth See admin instructions. Psyllium fiber 2 capsules by mouth everyday     pantoprazole (PROTONIX) 40 MG tablet Take 40 mg by mouth daily as needed (for GERD).     pyridOXINE (B-6) 50 MG tablet Take 1 tablet (50 mg total) by mouth daily. 30 tablet 2   No current facility-administered medications on file prior to visit.    Allergies:  No Known Allergies   Physical Exam  Vitals:   02/25/23 1059  BP: (!) 140/92  Pulse: 73  Weight: 264 lb (119.7 kg)  Height: 5\' 10"  (1.778 m)   Body mass index is 37.88 kg/m. No  results found.  General: well developed, well nourished, pleasant young Caucasian male, seated, in no evident distress Head: head normocephalic and atraumatic.   Neck: supple with no carotid or supraclavicular bruits Cardiovascular: regular rate and rhythm, no  murmurs Musculoskeletal: no deformity Skin:  no rash/petichiae Vascular:  Normal pulses all extremities  Neurologic Exam Mental Status: Awake and fully alert.  Severe expressive aphasia.  Nods appropriately to yes/no questions - able to say simple words.  Oriented to place and time. Recent and remote memory intact. Attention span, concentration and fund of knowledge appropriate. Mood and affect appropriate.  Cranial Nerves: Pupils equal, briskly reactive to light. Extraocular movements full without nystagmus. Visual fields partial right homonymous hemianopia.Marland Kitchen Hearing intact.  Right-sided facial sensation decreased compared to left side.  Lower facial paralysis right side.  Motor: RUE: 4/5 with spasticity throughout greatest in hand with limited movement, RLE: 4+/5 hip flexor, 4/5 quad, foot drop with use of AFO.  Full strength left upper and lower extremity Sensory.:  Decreased light touch, pinprick and vibratory sensation right upper and lower extremity compared to left upper and lower extremity Coordination: Rapid alternating movements normal on left side. Finger-to-nose and heel-to-shin performed accurately on left side. Gait and Station: Arises from chair without difficulty. Stance is normal. Gait demonstrates hemiplegic gait with use of cane and AFO device Reflexes: 1+ left upper and lower extremity.  2+ right upper and lower extremity.  Toes downgoing.        ASSESSMENT: Demari Gales is a 42 y.o. year old male here with large left MCA infarct on 02/19/18 secondary to left ICA dissection s/p [id number] revascularization of L MCA M1/2 followed by decompressive craniectomy. Vascular risk factors include tobacco use, EtOH use, HTN and severe hyperhomocysteinemia.  He unfortunately has sustained recent seizure activity on 10/15/2018 likely related to prior large left MCA infarct and was initiated on Keppra for seizure prophylaxis.     Left MCA stroke  -Residual right spastic hemiparesis,  right homonymous hemianopia and expressive aphasia -stable.  Continue to follow with PMR for spasticity management with Dysport injections and baclofen  -Continue aspirin 325 mg daily and atorvastatin for secondary stroke prevention -Close follow-up with PCP for aggressive stroke risk factor management including HTN with BP goal<130/90 and HLD with LDL goal<70 -Lipid panel LDL 45 (12/2022)  Hyperhomocystinemia -Continue B12, B6 and folic acid -B12 1033 (12/2022) -Continue to follow with PCP for ongoing monitoring  Seizure, late effect of stroke -Onset 10/15/2018 -No recurrent seizure activity -Continue Keppra 750 mg twice daily for seizure prophylaxis -refill up to date by PMR -CBC and CMP per PCP     Follow-up in 1 year or call earlier if needed    I spent 31 minutes of face-to-face and non-face-to-face time with patient and father.  This included previsit chart review, lab review, study review, order entry, electronic health record documentation, patient education and discussion regarding above diagnoses and treatment plan and answered all other questions to patient's satisfaction  Ihor Austin, [facility]  [facility] [address] [place], [place] [address]  Phone [phone] Fax [phone] Note: This document was prepared with digital dictation and possible smart phrase technology. Any transcriptional errors that result from this process are unintentional.

## 2023-06-02 ENCOUNTER — Encounter: Payer: Medicare HMO | Attending: Physical Medicine & Rehabilitation | Admitting: Physical Medicine & Rehabilitation

## 2023-06-02 ENCOUNTER — Encounter: Payer: Self-pay | Admitting: Physical Medicine & Rehabilitation

## 2023-06-02 VITALS — BP 161/98 | HR 81 | Ht 70.0 in | Wt 265.0 lb

## 2023-06-02 DIAGNOSIS — G8110 Spastic hemiplegia affecting unspecified side: Secondary | ICD-10-CM | POA: Diagnosis not present

## 2023-06-02 MED ORDER — ABOBOTULINUMTOXINA 300 UNITS IM SOLR
300.0000 [IU] | Freq: Once | INTRAMUSCULAR | Status: AC
Start: 2023-06-02 — End: 2023-06-02
  Administered 2023-06-02: 300 [IU] via INTRAMUSCULAR

## 2023-06-02 MED ORDER — ABOBOTULINUMTOXINA 500 UNITS IM SOLR
500.0000 [IU] | Freq: Once | INTRAMUSCULAR | Status: AC
Start: 2023-06-02 — End: 2023-06-02
  Administered 2023-06-02: 500 [IU] via INTRAMUSCULAR

## 2023-06-02 NOTE — Progress Notes (Signed)
Dysport Injection for spasticity using needle EMG guidance Indication:  No diagnosis found. G81.10  Dilution: 500 Units/68ml        Total Units Injected:  800 Indication: Severe spasticity which interferes with ADL,mobility and/or  hygiene and is unresponsive to medication management and other conservative care Informed consent was obtained after describing risks and benefits of the procedure with the patient. This includes bleeding, bruising, infection, excessive weakness, or medication side effects. A REMS form is on file and signed.  right Needle: 50mm injectable monopolar needle electrode  Number of units per muscle Pectoralis Major 0 units Pectoralis Minor 0 units Biceps 0 units Brachioradialis 0 units FCR 50 units FCU 50 units FDS 200 units FDP 200 units FPL 100 units Pronator Teres 200 units Pronator Quadratus 0 units Lumbricals 0 units  All injections were done after obtaining appropriate EMG activity and after negative drawback for blood. The patient tolerated the procedure well. Post procedure instructions were given.

## 2023-06-02 NOTE — Patient Instructions (Signed)
ALWAYS FEEL FREE TO CALL OUR OFFICE WITH ANY PROBLEMS OR QUESTIONS ([phone])  **PLEASE NOTE** ALL MEDICATION REFILL REQUESTS (INCLUDING CONTROLLED SUBSTANCES) NEED TO BE MADE AT LEAST 7 DAYS PRIOR TO REFILL BEING DUE. ANY REFILL REQUESTS INSIDE THAT TIME FRAME MAY RESULT IN DELAYS IN RECEIVING YOUR PRESCRIPTION.                    

## 2023-06-24 NOTE — Progress Notes (Deleted)
There were no vitals taken for this visit.   Subjective:    Patient ID: Xavier Villegas, male    DOB: 1981/01/14, 42 y.o.   MRN: [id number]  HPI: Xavier Villegas is a 42 y.o. male presenting on 06/25/2023 for comprehensive medical examination. Current medical complaints include:{Blank single:19197::"none","***"}  He currently lives with: Interim Problems from his last visit: {Blank single:19197::"yes","no"}  HYPERLIPIDEMIA Hyperlipidemia status: {Blank single:19197::"excellent compliance","good compliance","fair compliance","poor compliance"} Satisfied with current treatment?  {Blank single:19197::"yes","no"} Side effects:  {Blank single:19197::"yes","no"} Medication compliance: {Blank single:19197::"excellent compliance","good compliance","fair compliance","poor compliance"} Past cholesterol meds: {Blank multiple:19196::"none","atorvastain (lipitor)","lovastatin (mevacor)","pravastatin (pravachol)","rosuvastatin (crestor)","simvastatin (zocor)","vytorin","fenofibrate (tricor)","gemfibrozil","ezetimide (zetia)","niaspan","lovaza"} Supplements: {Blank multiple:19196::"none","fish oil","niacin","red yeast rice"} Aspirin:  {Blank single:19197::"yes","no"} The ASCVD Risk score (Arnett DK, et al., 2019) failed to calculate for the following reasons:   The patient has a prior MI or stroke diagnosis Chest pain:  {Blank single:19197::"yes","no"} Coronary artery disease:  {Blank single:19197::"yes","no"} Family history CAD:  {Blank single:19197::"yes","no"} Family history early CAD:  {Blank single:19197::"yes","no"}   Depression Screen done today and results listed below:     02/24/2023   10:44 AM 12/23/2022   10:26 AM 12/01/2022    9:22 AM 06/24/2022   11:29 AM 03/24/2022    2:25 PM  Depression screen PHQ 2/9  Decreased Interest 0 0  0 0  Down, Depressed, Hopeless 0 0 1 0 0  PHQ - 2 Score 0 0 1 0 0  Altered sleeping  1  0 0  Tired, decreased energy  2  0 1  Change in  appetite  1  0 1  Feeling bad or failure about yourself   0  0 0  Trouble concentrating  2  0 2  Moving slowly or fidgety/restless  3  0 3  Suicidal thoughts  0  0 0  PHQ-9 Score  9  0 7  Difficult doing work/chores  Extremely dIfficult  Not difficult at all Extremely dIfficult    The patient {has/does not have:19849} a history of falls. I {did/did not:19850} complete a risk assessment for falls. A plan of care for falls {was/was not:19852} documented.   Past Medical History:  Past Medical History:  Diagnosis Date   Aphasia    GERD (gastroesophageal reflux disease)    Hemiparesis (HCC) 02/2018   right side   Hyperhomocysteinemia (HCC)    Neurogenic bladder 02/2018   04/26/18 inporving   Prehypertension 07/22/2021   Spastic hemiparesis (HCC)    right side   Stroke (HCC)    Left MCA infart   Tobacco abuse     Surgical History:  Past Surgical History:  Procedure Laterality Date   CRANIOTOMY Left 02/21/2018   Procedure: DECOMPRESSIVE CRANIECTOMY with bone flap to abdomen;  Surgeon: Donalee Citrin, MD;  Location: [facility] OR;  Service: Neurosurgery;  Laterality: Left;  DECOMPRESSIVE CRANIECTOMY with bone flap to abdomen   CRANIOTOMY N/A 05/02/2018   Procedure: RE-IMPLANTATION OF CRANIAL FLAP;  Surgeon: Donalee Citrin, MD;  Location: [facility] OR;  Service: Neurosurgery;  Laterality: N/A;  RE-IMPLANTATION OF CRANIAL FLAP   IR ANGIO INTRA EXTRACRAN SEL COM CAROTID INNOMINATE UNI R MOD SED  02/19/2018   IR ANGIO VERTEBRAL SEL VERTEBRAL BILAT MOD SED  02/19/2018   IR CT HEAD LTD  02/19/2018   IR PERCUTANEOUS ART THROMBECTOMY/INFUSION INTRACRANIAL INC DIAG ANGIO  02/19/2018   IR US GUIDE VASC ACCESS RIGHT  02/19/2018   RADIOLOGY WITH ANESTHESIA N/A 02/19/2018   Procedure: IR WITH ANESTHESIA CODE STROKE;  Surgeon: Gilmer Mor, DO;  Location: [facility]  OR;  Service: Anesthesiology;  Laterality: N/A;   TEE WITHOUT CARDIOVERSION N/A 03/01/2018   Procedure: TRANSESOPHAGEAL ECHOCARDIOGRAM (TEE);  Surgeon: Thurmon Fair,  MD;  Location: [facility] ENDOSCOPY;  Service: Cardiovascular;  Laterality: N/A;    Medications:  Current Outpatient Medications on File Prior to Visit  Medication Sig   aspirin 325 MG tablet Take 1 tablet (325 mg total) by mouth daily.   atorvastatin (LIPITOR) 40 MG tablet Take 1 tablet (40 mg total) by mouth daily.   baclofen (LIORESAL) 20 MG tablet TAKE 1 TABLET FOUR TIMES DAILY   cholecalciferol (VITAMIN D3) 25 MCG (1000 UT) tablet Take 1,000 Units by mouth daily.   cyanocobalamin 1000 MCG tablet Take 1 tablet (1,000 mcg total) by mouth daily.   folic acid (FOLVITE) 1 MG tablet TAKE 1 TABLET FOUR TIMES DAILY   levETIRAcetam (KEPPRA) 750 MG tablet TAKE 1 TABLET TWICE DAILY   OVER THE COUNTER MEDICATION Take 2 capsules by mouth See admin instructions. Psyllium fiber 2 capsules by mouth everyday   pantoprazole (PROTONIX) 40 MG tablet Take 40 mg by mouth daily as needed (for GERD).   pyridOXINE (B-6) 50 MG tablet Take 1 tablet (50 mg total) by mouth daily.   No current facility-administered medications on file prior to visit.    Allergies:  No Known Allergies  Social History:  Social History   Socioeconomic History   Marital status: Divorced    Spouse name: Not on file   Number of children: Not on file   Years of education: Not on file   Highest education level: Not on file  Occupational History   Not on file  Tobacco Use   Smoking status: Former    Current packs/day: 0.00    Average packs/day: 0.5 packs/day for 8.0 years (4.0 ttl pk-yrs)    Types: Cigarettes    Start date: 02/19/2010    Quit date: 02/19/2018    Years since quitting: 5.3   Smokeless tobacco: Never  Vaping Use   Vaping status: Never Used  Substance and Sexual Activity   Alcohol use: Not Currently    Alcohol/week: 14.0 standard drinks of alcohol    Types: 14 Cans of beer per week   Drug use: Never   Sexual activity: Yes  Other Topics Concern   Not on file  Social History Narrative   Not on file   Social  Determinants of Health   Financial Resource Strain: Low Risk  (12/01/2022)   Overall Financial Resource Strain (CARDIA)    Difficulty of Paying Living Expenses: Not hard at all  Food Insecurity: No Food Insecurity (12/01/2022)   Hunger Vital Sign    Worried About Running Out of Food in the Last Year: Never true    Ran Out of Food in the Last Year: Never true  Transportation Needs: No Transportation Needs (12/01/2022)   PRAPARE - Administrator, Civil Service (Medical): No    Lack of Transportation (Non-Medical): No  Physical Activity: Insufficiently Active (12/01/2022)   Exercise Vital Sign    Days of Exercise per Week: 7 days    Minutes of Exercise per Session: 10 min  Stress: No Stress Concern Present (12/01/2022)   [employer] of Occupational Health - Occupational Stress Questionnaire    Feeling of Stress : Not at all  Social Connections: Socially Isolated (12/01/2022)   Social Connection and Isolation Panel [NHANES]    Frequency of Communication with Friends and Family: More than three times a week    Frequency  of Social Gatherings with Friends and Family: More than three times a week    Attends Religious Services: Never    Database administrator or Organizations: No    Attends Banker Meetings: Never    Marital Status: Divorced  Catering manager Violence: Not At Risk (12/01/2022)   Humiliation, Afraid, Rape, and Kick questionnaire    Fear of Current or Ex-Partner: No    Emotionally Abused: No    Physically Abused: No    Sexually Abused: No   Social History   Tobacco Use  Smoking Status Former   Current packs/day: 0.00   Average packs/day: 0.5 packs/day for 8.0 years (4.0 ttl pk-yrs)   Types: Cigarettes   Start date: 02/19/2010   Quit date: 02/19/2018   Years since quitting: 5.3  Smokeless Tobacco Never   Social History   Substance and Sexual Activity  Alcohol Use Not Currently   Alcohol/week: 14.0 standard drinks of alcohol   Types: 14  Cans of beer per week    Family History:  Family History  Problem Relation Age of Onset   Arthritis Mother    Leukemia Mother        CLL/[facility] James Ivanoff at [facility]   Hypertension Father    Diabetes Father    Heart disease Maternal Grandmother    Cancer Maternal Grandfather    Liver cancer Paternal Grandmother    Cerebral aneurysm Paternal Grandfather     Past medical history, surgical history, medications, allergies, family history and social history reviewed with patient today and changes made to appropriate areas of the chart.   ROS All other ROS negative except what is listed above and in the HPI.      Objective:    There were no vitals taken for this visit.  Wt Readings from Last 3 Encounters:  06/02/23 265 lb (120.2 kg)  02/25/23 264 lb (119.7 kg)  02/24/23 266 lb (120.7 kg)    Physical Exam  Results for orders placed or performed in visit on 12/23/22  Comp Met (CMET)  Result Value Ref Range   Glucose 85 70 - 99 mg/dL   BUN 8 6 - 24 mg/dL   Creatinine, Ser 4.09 0.76 - 1.27 mg/dL   eGFR 811 >91 YN/WGN/5.62   BUN/Creatinine Ratio 8 (L) 9 - 20   Sodium 141 134 - 144 mmol/L   Potassium 4.8 3.5 - 5.2 mmol/L   Chloride 100 96 - 106 mmol/L   CO2 25 20 - 29 mmol/L   Calcium 9.6 8.7 - 10.2 mg/dL   Total Protein 7.2 6.0 - 8.5 g/dL   Albumin 4.4 4.1 - 5.1 g/dL   Globulin, Total 2.8 1.5 - 4.5 g/dL   Albumin/Globulin Ratio 1.6 1.2 - 2.2   Bilirubin Total 0.8 0.0 - 1.2 mg/dL   Alkaline Phosphatase 146 (H) 44 - 121 IU/L   AST 24 0 - 40 IU/L   ALT 35 0 - 44 IU/L  Lipid Profile  Result Value Ref Range   Cholesterol, Total 101 100 - 199 mg/dL   Triglycerides 99 0 - 149 mg/dL   HDL 37 (L) >13 mg/dL   VLDL Cholesterol Cal 19 5 - 40 mg/dL   LDL Chol Calc (NIH) 45 0 - 99 mg/dL   Chol/HDL Ratio 2.7 0.0 - 5.0 ratio  B12  Result Value Ref Range   Vitamin B-12 1,033 232 - 1,245 pg/mL      Assessment & Plan:   Problem List Items Addressed This Visit  Nervous and  Auditory   Right hemiparesis (HCC)     Other   B12 deficiency   Hyperlipidemia - Primary   Obesity (BMI 30-39.9)   Other Visit Diagnoses     Annual physical exam            Discussed aspirin prophylaxis for myocardial infarction prevention and decision was {Blank single:19197::"it was not indicated","made to continue ASA","made to start ASA","made to stop ASA","that we recommended ASA, and patient refused"}  LABORATORY TESTING:  Health maintenance labs ordered today as discussed above.   The natural history of prostate cancer and ongoing controversy regarding screening and potential treatment outcomes of prostate cancer has been discussed with the patient. The meaning of a false positive PSA and a false negative PSA has been discussed. He indicates understanding of the limitations of this screening test and wishes *** to proceed with screening PSA testing.   IMMUNIZATIONS:   - Tdap: Tetanus vaccination status reviewed: {tetanus status:315746}. - Influenza: {Blank single:19197::"Up to date","Administered today","Postponed to flu season","Refused","Given elsewhere"} - Pneumovax: {Blank single:19197::"Up to date","Administered today","Not applicable","Refused","Given elsewhere"} - Prevnar: {Blank single:19197::"Up to date","Administered today","Not applicable","Refused","Given elsewhere"} - COVID: {Blank single:19197::"Up to date","Administered today","Not applicable","Refused","Given elsewhere"} - HPV: {Blank single:19197::"Up to date","Administered today","Not applicable","Refused","Given elsewhere"} - Shingrix vaccine: {Blank single:19197::"Up to date","Administered today","Not applicable","Refused","Given elsewhere"}  SCREENING: - Colonoscopy: {Blank single:19197::"Up to date","Ordered today","Not applicable","Refused","Done elsewhere"}  Discussed with patient purpose of the colonoscopy is to detect colon cancer at curable precancerous or early stages   - AAA Screening: {Blank  single:19197::"Up to date","Ordered today","Not applicable","Refused","Done elsewhere"}  -Hearing Test: {Blank single:19197::"Up to date","Ordered today","Not applicable","Refused","Done elsewhere"}  -Spirometry: {Blank single:19197::"Up to date","Ordered today","Not applicable","Refused","Done elsewhere"}   PATIENT COUNSELING:    Sexuality: Discussed sexually transmitted diseases, partner selection, use of condoms, avoidance of unintended pregnancy  and contraceptive alternatives.   Advised to avoid cigarette smoking.  I discussed with the patient that most people either abstain from alcohol or drink within safe limits (<=14/week and <=4 drinks/occasion for males, <=7/weeks and <= 3 drinks/occasion for females) and that the risk for alcohol disorders and other health effects rises proportionally with the number of drinks per week and how often a drinker exceeds daily limits.  Discussed cessation/primary prevention of drug use and availability of treatment for abuse.   Diet: Encouraged to adjust caloric intake to maintain  or achieve ideal body weight, to reduce intake of dietary saturated fat and total fat, to limit sodium intake by avoiding high sodium foods and not adding table salt, and to maintain adequate dietary potassium and calcium preferably from fresh fruits, vegetables, and low-fat dairy products.    stressed the importance of regular exercise  Injury prevention: Discussed safety belts, safety helmets, smoke detector, smoking near bedding or upholstery.   Dental health: Discussed importance of regular tooth brushing, flossing, and dental visits.   Follow up plan: NEXT PREVENTATIVE PHYSICAL DUE IN 1 YEAR. No follow-ups on file.

## 2023-06-25 ENCOUNTER — Encounter: Payer: Medicare HMO | Admitting: Nurse Practitioner

## 2023-06-25 DIAGNOSIS — E669 Obesity, unspecified: Secondary | ICD-10-CM

## 2023-06-25 DIAGNOSIS — Z0000 Encounter for general adult medical examination without abnormal findings: Secondary | ICD-10-CM

## 2023-06-25 DIAGNOSIS — G8191 Hemiplegia, unspecified affecting right dominant side: Secondary | ICD-10-CM

## 2023-06-25 DIAGNOSIS — E785 Hyperlipidemia, unspecified: Secondary | ICD-10-CM

## 2023-06-25 DIAGNOSIS — E538 Deficiency of other specified B group vitamins: Secondary | ICD-10-CM

## 2023-07-15 ENCOUNTER — Ambulatory Visit (INDEPENDENT_AMBULATORY_CARE_PROVIDER_SITE_OTHER): Payer: Medicare HMO | Admitting: Nurse Practitioner

## 2023-07-15 ENCOUNTER — Encounter: Payer: Self-pay | Admitting: Nurse Practitioner

## 2023-07-15 VITALS — BP 147/95 | HR 83 | Temp 97.5°F | Ht 70.0 in | Wt 268.4 lb

## 2023-07-15 DIAGNOSIS — E538 Deficiency of other specified B group vitamins: Secondary | ICD-10-CM

## 2023-07-15 DIAGNOSIS — Z23 Encounter for immunization: Secondary | ICD-10-CM | POA: Diagnosis not present

## 2023-07-15 DIAGNOSIS — Z0000 Encounter for general adult medical examination without abnormal findings: Secondary | ICD-10-CM | POA: Diagnosis not present

## 2023-07-15 DIAGNOSIS — E785 Hyperlipidemia, unspecified: Secondary | ICD-10-CM | POA: Diagnosis not present

## 2023-07-15 DIAGNOSIS — E669 Obesity, unspecified: Secondary | ICD-10-CM

## 2023-07-15 DIAGNOSIS — G8110 Spastic hemiplegia affecting unspecified side: Secondary | ICD-10-CM | POA: Diagnosis not present

## 2023-07-15 DIAGNOSIS — Z136 Encounter for screening for cardiovascular disorders: Secondary | ICD-10-CM

## 2023-07-15 MED ORDER — ATORVASTATIN CALCIUM 40 MG PO TABS: 40.0000 mg | ORAL_TABLET | Freq: Every day | ORAL | 1 refills | Status: DC

## 2023-07-15 NOTE — Assessment & Plan Note (Signed)
Labs ordered at visit today.  Will make recommendations based on lab results.   

## 2023-07-15 NOTE — Assessment & Plan Note (Signed)
Recommended eating smaller high protein, low fat meals more frequently 30 mins a day 5 times a week with a goal of 10-15lb weight loss in the next 3 months.

## 2023-07-15 NOTE — Progress Notes (Signed)
BP (!) 147/95 (BP Location: Left Arm, Patient Position: Sitting, Cuff Size: Large)   Pulse 83   Temp (!) 97.5 F (36.4 C) (Oral)   Ht 5\' 10"  (1.778 m)   Wt 268 lb 6.4 oz (121.7 kg)   SpO2 98%   BMI 38.51 kg/m    Subjective:    Patient ID: Xavier Villegas, male    DOB: 1980/12/30, 42 y.o.   MRN: [id number]  HPI: Xavier Villegas is a 42 y.o. male presenting on 07/15/2023 for comprehensive medical examination. Current medical complaints include:none  He currently lives with: Interim Problems from his last visit: no  HYPERLIPIDEMIA Hyperlipidemia status: excellent compliance Satisfied with current treatment?  yes Side effects:  no Medication compliance: excellent compliance Past cholesterol meds: atorvastain (lipitor) Supplements: none Aspirin:  no The ASCVD Risk score (Arnett DK, et al., 2019) failed to calculate for the following reasons:   The patient has a prior MI or stroke diagnosis Chest pain:  no Coronary artery disease:  no Family history CAD:  no Family history early CAD:  no   Depression Screen done today and results listed below:     07/15/2023    9:27 AM 02/24/2023   10:44 AM 12/23/2022   10:26 AM 12/01/2022    9:22 AM 06/24/2022   11:29 AM  Depression screen PHQ 2/9  Decreased Interest 0 0 0  0  Down, Depressed, Hopeless 0 0 0 1 0  PHQ - 2 Score 0 0 0 1 0  Altered sleeping 0  1  0  Tired, decreased energy 1  2  0  Change in appetite 0  1  0  Feeling bad or failure about yourself  0  0  0  Trouble concentrating 0  2  0  Moving slowly or fidgety/restless 3  3  0  Suicidal thoughts 0  0  0  PHQ-9 Score 4  9  0  Difficult doing work/chores   Extremely dIfficult  Not difficult at all    The patient does not have a history of falls. I did complete a risk assessment for falls. A plan of care for falls was documented.   Past Medical History:  Past Medical History:  Diagnosis Date   Aphasia    GERD (gastroesophageal reflux disease)     Hemiparesis (HCC) 02/2018   right side   Hyperhomocysteinemia (HCC)    Neurogenic bladder 02/2018   04/26/18 inporving   Prehypertension 07/22/2021   Spastic hemiparesis (HCC)    right side   Stroke (HCC)    Left MCA infart   Tobacco abuse     Surgical History:  Past Surgical History:  Procedure Laterality Date   CRANIOTOMY Left 02/21/2018   Procedure: DECOMPRESSIVE CRANIECTOMY with bone flap to abdomen;  Surgeon: Donalee Citrin, MD;  Location: [facility] OR;  Service: Neurosurgery;  Laterality: Left;  DECOMPRESSIVE CRANIECTOMY with bone flap to abdomen   CRANIOTOMY N/A 05/02/2018   Procedure: RE-IMPLANTATION OF CRANIAL FLAP;  Surgeon: Donalee Citrin, MD;  Location: [facility] OR;  Service: Neurosurgery;  Laterality: N/A;  RE-IMPLANTATION OF CRANIAL FLAP   IR ANGIO INTRA EXTRACRAN SEL COM CAROTID INNOMINATE UNI R MOD SED  02/19/2018   IR ANGIO VERTEBRAL SEL VERTEBRAL BILAT MOD SED  02/19/2018   IR CT HEAD LTD  02/19/2018   IR PERCUTANEOUS ART THROMBECTOMY/INFUSION INTRACRANIAL INC DIAG ANGIO  02/19/2018   IR US GUIDE VASC ACCESS RIGHT  02/19/2018   RADIOLOGY WITH ANESTHESIA N/A 02/19/2018   Procedure:  IR WITH ANESTHESIA CODE STROKE;  Surgeon: Gilmer Mor, DO;  Location: [facility] OR;  Service: Anesthesiology;  Laterality: N/A;   TEE WITHOUT CARDIOVERSION N/A 03/01/2018   Procedure: TRANSESOPHAGEAL ECHOCARDIOGRAM (TEE);  Surgeon: Thurmon Fair, MD;  Location: [facility] ENDOSCOPY;  Service: Cardiovascular;  Laterality: N/A;    Medications:  Current Outpatient Medications on File Prior to Visit  Medication Sig   aspirin 325 MG tablet Take 1 tablet (325 mg total) by mouth daily.   atorvastatin (LIPITOR) 40 MG tablet Take 1 tablet (40 mg total) by mouth daily.   baclofen (LIORESAL) 20 MG tablet TAKE 1 TABLET FOUR TIMES DAILY   cholecalciferol (VITAMIN D3) 25 MCG (1000 UT) tablet Take 1,000 Units by mouth daily.   cyanocobalamin 1000 MCG tablet Take 1 tablet (1,000 mcg total) by mouth daily.   folic acid (FOLVITE) 1 MG tablet  TAKE 1 TABLET FOUR TIMES DAILY   levETIRAcetam (KEPPRA) 750 MG tablet TAKE 1 TABLET TWICE DAILY   OVER THE COUNTER MEDICATION Take 2 capsules by mouth See admin instructions. Psyllium fiber 2 capsules by mouth everyday   pantoprazole (PROTONIX) 40 MG tablet Take 40 mg by mouth daily as needed (for GERD).   pyridOXINE (B-6) 50 MG tablet Take 1 tablet (50 mg total) by mouth daily.   No current facility-administered medications on file prior to visit.    Allergies:  Not on File  Social History:  Social History   Socioeconomic History   Marital status: Divorced    Spouse name: Not on file   Number of children: Not on file   Years of education: Not on file   Highest education level: Not on file  Occupational History   Not on file  Tobacco Use   Smoking status: Former    Current packs/day: 0.00    Average packs/day: 0.5 packs/day for 8.0 years (4.0 ttl pk-yrs)    Types: Cigarettes    Start date: 02/19/2010    Quit date: 02/19/2018    Years since quitting: 5.4   Smokeless tobacco: Never  Vaping Use   Vaping status: Never Used  Substance and Sexual Activity   Alcohol use: Not Currently    Alcohol/week: 14.0 standard drinks of alcohol    Types: 14 Cans of beer per week   Drug use: Never   Sexual activity: Yes  Other Topics Concern   Not on file  Social History Narrative   Not on file   Social Determinants of Health   Financial Resource Strain: Low Risk  (12/01/2022)   Overall Financial Resource Strain (CARDIA)    Difficulty of Paying Living Expenses: Not hard at all  Food Insecurity: No Food Insecurity (12/01/2022)   Hunger Vital Sign    Worried About Running Out of Food in the Last Year: Never true    Ran Out of Food in the Last Year: Never true  Transportation Needs: No Transportation Needs (12/01/2022)   PRAPARE - Administrator, Civil Service (Medical): No    Lack of Transportation (Non-Medical): No  Physical Activity: Insufficiently Active (12/01/2022)    Exercise Vital Sign    Days of Exercise per Week: 7 days    Minutes of Exercise per Session: 10 min  Stress: No Stress Concern Present (12/01/2022)   [employer] of Occupational Health - Occupational Stress Questionnaire    Feeling of Stress : Not at all  Social Connections: Socially Isolated (12/01/2022)   Social Connection and Isolation Panel [NHANES]    Frequency of Communication with  Friends and Family: More than three times a week    Frequency of Social Gatherings with Friends and Family: More than three times a week    Attends Religious Services: Never    Database administrator or Organizations: No    Attends Banker Meetings: Never    Marital Status: Divorced  Catering manager Violence: Not At Risk (12/01/2022)   Humiliation, Afraid, Rape, and Kick questionnaire    Fear of Current or Ex-Partner: No    Emotionally Abused: No    Physically Abused: No    Sexually Abused: No   Social History   Tobacco Use  Smoking Status Former   Current packs/day: 0.00   Average packs/day: 0.5 packs/day for 8.0 years (4.0 ttl pk-yrs)   Types: Cigarettes   Start date: 02/19/2010   Quit date: 02/19/2018   Years since quitting: 5.4  Smokeless Tobacco Never   Social History   Substance and Sexual Activity  Alcohol Use Not Currently   Alcohol/week: 14.0 standard drinks of alcohol   Types: 14 Cans of beer per week    Family History:  Family History  Problem Relation Age of Onset   Arthritis Mother    Leukemia Mother        CLL/[facility] James Ivanoff at [facility]   Hypertension Father    Diabetes Father    Heart disease Maternal Grandmother    Cancer Maternal Grandfather    Liver cancer Paternal Grandmother    Cerebral aneurysm Paternal Grandfather     Past medical history, surgical history, medications, allergies, family history and social history reviewed with patient today and changes made to appropriate areas of the chart.   Review of Systems  Eyes:  Negative for blurred  vision and double vision.  Respiratory:  Negative for shortness of breath.   Cardiovascular:  Negative for chest pain, palpitations and leg swelling.  Neurological:  Negative for dizziness and headaches.   All other ROS negative except what is listed above and in the HPI.      Objective:    BP (!) 147/95 (BP Location: Left Arm, Patient Position: Sitting, Cuff Size: Large)   Pulse 83   Temp (!) 97.5 F (36.4 C) (Oral)   Ht 5\' 10"  (1.778 m)   Wt 268 lb 6.4 oz (121.7 kg)   SpO2 98%   BMI 38.51 kg/m   Wt Readings from Last 3 Encounters:  07/15/23 268 lb 6.4 oz (121.7 kg)  06/02/23 265 lb (120.2 kg)  02/25/23 264 lb (119.7 kg)    Physical Exam Vitals and nursing note reviewed.  Constitutional:      General: He is not in acute distress.    Appearance: Normal appearance. He is not ill-appearing, toxic-appearing or diaphoretic.  HENT:     Head: Normocephalic.     Right Ear: Tympanic membrane, ear canal and external ear normal.     Left Ear: Tympanic membrane, ear canal and external ear normal.     Nose: Nose normal. No congestion or rhinorrhea.     Mouth/Throat:     Mouth: Mucous membranes are moist.  Eyes:     General:        Right eye: No discharge.        Left eye: No discharge.     Extraocular Movements: Extraocular movements intact.     Conjunctiva/sclera: Conjunctivae normal.     Pupils: Pupils are equal, round, and reactive to light.  Cardiovascular:     Rate and Rhythm: Normal rate and  regular rhythm.     Heart sounds: No murmur heard. Pulmonary:     Effort: Pulmonary effort is normal. No respiratory distress.     Breath sounds: Normal breath sounds. No wheezing, rhonchi or rales.  Abdominal:     General: Abdomen is flat. Bowel sounds are normal. There is no distension.     Palpations: Abdomen is soft.     Tenderness: There is no abdominal tenderness. There is no guarding.  Musculoskeletal:     Cervical back: Normal range of motion and neck supple.     Comments:  Right sided weakness  Skin:    General: Skin is warm and dry.     Capillary Refill: Capillary refill takes less than 2 seconds.  Neurological:     General: No focal deficit present.     Mental Status: He is alert and oriented to person, place, and time.     Cranial Nerves: No cranial nerve deficit.     Motor: No weakness.     Deep Tendon Reflexes: Reflexes normal.  Psychiatric:        Mood and Affect: Mood normal.        Behavior: Behavior normal.        Thought Content: Thought content normal.        Judgment: Judgment normal.     Results for orders placed or performed in visit on 12/23/22  Comp Met (CMET)  Result Value Ref Range   Glucose 85 70 - 99 mg/dL   BUN 8 6 - 24 mg/dL   Creatinine, Ser 3.66 0.76 - 1.27 mg/dL   eGFR 440 >34 VQ/QVZ/5.63   BUN/Creatinine Ratio 8 (L) 9 - 20   Sodium 141 134 - 144 mmol/L   Potassium 4.8 3.5 - 5.2 mmol/L   Chloride 100 96 - 106 mmol/L   CO2 25 20 - 29 mmol/L   Calcium 9.6 8.7 - 10.2 mg/dL   Total Protein 7.2 6.0 - 8.5 g/dL   Albumin 4.4 4.1 - 5.1 g/dL   Globulin, Total 2.8 1.5 - 4.5 g/dL   Albumin/Globulin Ratio 1.6 1.2 - 2.2   Bilirubin Total 0.8 0.0 - 1.2 mg/dL   Alkaline Phosphatase 146 (H) 44 - 121 IU/L   AST 24 0 - 40 IU/L   ALT 35 0 - 44 IU/L  Lipid Profile  Result Value Ref Range   Cholesterol, Total 101 100 - 199 mg/dL   Triglycerides 99 0 - 149 mg/dL   HDL 37 (L) >87 mg/dL   VLDL Cholesterol Cal 19 5 - 40 mg/dL   LDL Chol Calc (NIH) 45 0 - 99 mg/dL   Chol/HDL Ratio 2.7 0.0 - 5.0 ratio  B12  Result Value Ref Range   Vitamin B-12 1,033 232 - 1,245 pg/mL      Assessment & Plan:   Problem List Items Addressed This Visit       Nervous and Auditory   Spastic hemiparesis affecting dominant side (HCC)    Chronic, ongoing condition, secondary to CVA in 2019  He is regularly seeing Neurology and Phys medicine for monitoring and management- receives Botox injection to help with spasticity. Continue coordination and  collaboration with specialties         Other   B12 deficiency    Labs ordered at visit today.  Will make recommendations based on lab results.        Relevant Orders   B12   Need for influenza vaccination   Relevant Orders   Flu  vaccine trivalent PF, 6mos and older(Flulaval,Afluria,Fluarix,Fluzone) (Completed)   Obesity (BMI 30-39.9)    Recommended eating smaller high protein, low fat meals more frequently 30 mins a day 5 times a week with a goal of 10-15lb weight loss in the next 3 months.       Other Visit Diagnoses     Annual physical exam    -  Primary   Health maintenance reviewed during visit today. Labs ordered.  Flu shot given.  Declined COVID.   Relevant Orders   TSH   Lipid panel   CBC with Differential/Platelet   Comprehensive metabolic panel   Urinalysis, Routine w reflex microscopic   B12   Screening for ischemic heart disease       Relevant Orders   Lipid panel        Discussed aspirin prophylaxis for myocardial infarction prevention and decision was made to continue ASA  LABORATORY TESTING:  Health maintenance labs ordered today as discussed above.    IMMUNIZATIONS:   - Tdap: Tetanus vaccination status reviewed: Medicare. - Influenza: Administered today - Pneumovax: Not applicable - Prevnar: Not applicable - COVID: Refused - HPV: Not applicable - Shingrix vaccine: Not applicable  SCREENING: - Colonoscopy: Not applicable  Discussed with patient purpose of the colonoscopy is to detect colon cancer at curable precancerous or early stages   - AAA Screening: Not applicable  -Hearing Test: Not applicable  -Spirometry: Not applicable   PATIENT COUNSELING:    Sexuality: Discussed sexually transmitted diseases, partner selection, use of condoms, avoidance of unintended pregnancy  and contraceptive alternatives.   Advised to avoid cigarette smoking.  I discussed with the patient that most people either abstain from alcohol or drink within safe  limits (<=14/week and <=4 drinks/occasion for males, <=7/weeks and <= 3 drinks/occasion for females) and that the risk for alcohol disorders and other health effects rises proportionally with the number of drinks per week and how often a drinker exceeds daily limits.  Discussed cessation/primary prevention of drug use and availability of treatment for abuse.   Diet: Encouraged to adjust caloric intake to maintain  or achieve ideal body weight, to reduce intake of dietary saturated fat and total fat, to limit sodium intake by avoiding high sodium foods and not adding table salt, and to maintain adequate dietary potassium and calcium preferably from fresh fruits, vegetables, and low-fat dairy products.    stressed the importance of regular exercise  Injury prevention: Discussed safety belts, safety helmets, smoke detector, smoking near bedding or upholstery.   Dental health: Discussed importance of regular tooth brushing, flossing, and dental visits.   Follow up plan: NEXT PREVENTATIVE PHYSICAL DUE IN 1 YEAR. Return in about 6 months (around 01/13/2024) for HTN, HLD, DM2 FU.

## 2023-07-15 NOTE — Assessment & Plan Note (Signed)
Chronic, ongoing condition, secondary to CVA in 2019  He is regularly seeing Neurology and Phys medicine for monitoring and management- receives Botox injection to help with spasticity. Continue coordination and collaboration with specialties

## 2023-07-16 LAB — CBC WITH DIFFERENTIAL/PLATELET
Basophils Absolute: 0 10*3/uL (ref 0.0–0.2)
Basos: 0 %
EOS (ABSOLUTE): 0.1 10*3/uL (ref 0.0–0.4)
Eos: 1 %
Hematocrit: 48.7 % (ref 37.5–51.0)
Hemoglobin: 16.3 g/dL (ref 13.0–17.7)
Immature Grans (Abs): 0 10*3/uL (ref 0.0–0.1)
Immature Granulocytes: 0 %
Lymphocytes Absolute: 2.6 10*3/uL (ref 0.7–3.1)
Lymphs: 24 %
MCH: 30.5 pg (ref 26.6–33.0)
MCHC: 33.5 g/dL (ref 31.5–35.7)
MCV: 91 fL (ref 79–97)
Monocytes Absolute: 0.6 10*3/uL (ref 0.1–0.9)
Monocytes: 6 %
Neutrophils Absolute: 7.4 10*3/uL — ABNORMAL HIGH (ref 1.4–7.0)
Neutrophils: 69 %
Platelets: 224 10*3/uL (ref 150–450)
RBC: 5.34 x10E6/uL (ref 4.14–5.80)
RDW: 13.9 % (ref 11.6–15.4)
WBC: 10.8 10*3/uL (ref 3.4–10.8)

## 2023-07-16 LAB — COMPREHENSIVE METABOLIC PANEL
ALT: 39 [IU]/L (ref 0–44)
AST: 30 [IU]/L (ref 0–40)
Albumin: 4.4 g/dL (ref 4.1–5.1)
Alkaline Phosphatase: 144 [IU]/L — ABNORMAL HIGH (ref 44–121)
BUN/Creatinine Ratio: 9 (ref 9–20)
BUN: 9 mg/dL (ref 6–24)
Bilirubin Total: 0.9 mg/dL (ref 0.0–1.2)
CO2: 24 mmol/L (ref 20–29)
Calcium: 9.4 mg/dL (ref 8.7–10.2)
Chloride: 99 mmol/L (ref 96–106)
Creatinine, Ser: 1 mg/dL (ref 0.76–1.27)
Globulin, Total: 2.9 g/dL (ref 1.5–4.5)
Glucose: 82 mg/dL (ref 70–99)
Potassium: 4.8 mmol/L (ref 3.5–5.2)
Sodium: 139 mmol/L (ref 134–144)
Total Protein: 7.3 g/dL (ref 6.0–8.5)
eGFR: 97 mL/min/{1.73_m2} (ref >59)

## 2023-07-16 LAB — LIPID PANEL
Chol/HDL Ratio: 3.2 {ratio} (ref 0.0–5.0)
Cholesterol, Total: 111 mg/dL (ref 100–199)
HDL: 35 mg/dL — ABNORMAL LOW (ref >39)
LDL Chol Calc (NIH): 59 mg/dL (ref 0–99)
Triglycerides: 87 mg/dL (ref 0–149)
VLDL Cholesterol Cal: 17 mg/dL (ref 5–40)

## 2023-07-16 LAB — TSH: TSH: 2.02 u[IU]/mL (ref 0.450–4.500)

## 2023-07-16 LAB — VITAMIN B12: Vitamin B-12: 970 pg/mL (ref 232–1245)

## 2023-07-19 DIAGNOSIS — Z0000 Encounter for general adult medical examination without abnormal findings: Secondary | ICD-10-CM | POA: Diagnosis not present

## 2023-07-19 LAB — MICROSCOPIC EXAMINATION
Epithelial Cells (non renal): NONE SEEN /[HPF] (ref 0–10)
RBC, Urine: NONE SEEN /[HPF] (ref 0–2)
WBC, UA: NONE SEEN /[HPF] (ref 0–5)

## 2023-07-19 LAB — URINALYSIS, ROUTINE W REFLEX MICROSCOPIC
Bilirubin, UA: NEGATIVE
Glucose, UA: NEGATIVE
Ketones, UA: NEGATIVE
Leukocytes,UA: NEGATIVE
Nitrite, UA: NEGATIVE
Protein,UA: NEGATIVE
RBC, UA: NEGATIVE
Specific Gravity, UA: 1.025 (ref 1.005–1.030)
Urobilinogen, Ur: 1 mg/dL (ref 0.2–1.0)
pH, UA: 6.5 (ref 5.0–7.5)

## 2023-07-26 ENCOUNTER — Other Ambulatory Visit: Payer: Self-pay | Admitting: Physical Medicine & Rehabilitation

## 2023-07-26 DIAGNOSIS — G8110 Spastic hemiplegia affecting unspecified side: Secondary | ICD-10-CM

## 2023-08-06 ENCOUNTER — Other Ambulatory Visit: Payer: Self-pay | Admitting: Physical Medicine & Rehabilitation

## 2023-08-06 DIAGNOSIS — E539 Vitamin B deficiency, unspecified: Secondary | ICD-10-CM

## 2023-08-06 DIAGNOSIS — I63412 Cerebral infarction due to embolism of left middle cerebral artery: Secondary | ICD-10-CM

## 2023-09-01 ENCOUNTER — Encounter: Payer: Medicare HMO | Attending: Physical Medicine & Rehabilitation | Admitting: Physical Medicine & Rehabilitation

## 2023-09-01 ENCOUNTER — Encounter: Payer: Self-pay | Admitting: Physical Medicine & Rehabilitation

## 2023-09-01 VITALS — BP 139/90 | HR 78 | Ht 70.0 in | Wt 264.0 lb

## 2023-09-01 DIAGNOSIS — G8110 Spastic hemiplegia affecting unspecified side: Secondary | ICD-10-CM

## 2023-09-01 MED ORDER — ABOBOTULINUMTOXINA 300 UNITS IM SOLR
300.0000 [IU] | Freq: Once | INTRAMUSCULAR | Status: AC
  Administered 2023-09-01: 300 [IU] via INTRAMUSCULAR

## 2023-09-01 MED ORDER — ABOBOTULINUMTOXINA 500 UNITS IM SOLR
500.0000 [IU] | Freq: Once | INTRAMUSCULAR | Status: AC
  Administered 2023-09-01: 500 [IU] via INTRAMUSCULAR

## 2023-09-01 NOTE — Progress Notes (Signed)
Dysport Injection for spasticity using needle EMG guidance Indication:  Spastic hemiparesis affecting dominant side (HCC) - Plan: AbobotulinumtoxinA (DYSPORT) 500 units injection 500 Units, abobotulinumtoxinA (DYSPORT) 300 units injection 300 Units   Dilution: 500 Units/45ml        Total Units Injected:  800 Indication: Severe spasticity which interferes with ADL,mobility and/or  hygiene and is unresponsive to medication management and other conservative care Informed consent was obtained after describing risks and benefits of the procedure with the patient. This includes bleeding, bruising, infection, excessive weakness, or medication side effects. A REMS form is on file and signed.  G81.10 Needle: 50mm injectable monopolar needle electrode  Number of units per muscle Pectoralis Major 0 units Pectoralis Minor 0 units Biceps 0 units Brachioradialis 0 units FCR 50 units FCU 50 units FDS 200 units FDP 200 units FPL 0 units Pronator Teres 3000 units Pronator Quadratus 0 units Lumbricals 0 units  All injections were done after obtaining appropriate EMG activity and after negative drawback for blood. The patient tolerated the procedure well. Post procedure instructions were given.

## 2023-09-01 NOTE — Patient Instructions (Signed)
ALWAYS FEEL FREE TO CALL OUR OFFICE WITH ANY PROBLEMS OR QUESTIONS [phone])  **PLEASE NOTE** ALL MEDICATION REFILL REQUESTS (INCLUDING CONTROLLED SUBSTANCES) NEED TO BE MADE AT LEAST 7 DAYS PRIOR TO REFILL BEING DUE. ANY REFILL REQUESTS INSIDE THAT TIME FRAME MAY RESULT IN DELAYS IN RECEIVING YOUR PRESCRIPTION.     !!!!!HAPPY NEW YEAR!!!!!

## 2023-09-11 ENCOUNTER — Other Ambulatory Visit: Payer: Self-pay | Admitting: Nurse Practitioner

## 2023-09-11 DIAGNOSIS — E785 Hyperlipidemia, unspecified: Secondary | ICD-10-CM

## 2023-09-13 NOTE — Telephone Encounter (Signed)
Requested Prescriptions  Refused Prescriptions Disp Refills   atorvastatin (LIPITOR) 40 MG tablet [Pharmacy Med Name: Atorvastatin Calcium Oral Tablet 40 MG] 90 tablet 3    Sig: TAKE 1 TABLET EVERY DAY     There is no refill protocol information for this order

## 2023-09-21 ENCOUNTER — Other Ambulatory Visit: Payer: Self-pay | Admitting: Nurse Practitioner

## 2023-09-21 DIAGNOSIS — E785 Hyperlipidemia, unspecified: Secondary | ICD-10-CM

## 2023-09-21 NOTE — Telephone Encounter (Signed)
Requesting RX be sent to mail order pharmacy

## 2023-09-21 NOTE — Telephone Encounter (Signed)
Prescription Request  09/21/2023  LOV: 07/15/2023  What is the name of the medication or equipment? atorvastatin (LIPITOR) 40 MG   Have you contacted your pharmacy to request a refill? Yes   Which pharmacy would you like this sent to?   [facility] - [place], [place] - [address] [place] [address] Phone: [phone] Fax: [phone]  Patient notified that their request is being sent to the clinical staff for review and that they should receive a response within 2 business days.   Please advise at Mobile [phone] (mobile)   Patients father brought in form for provider to review. Placing form in providers folder.

## 2023-09-22 MED ORDER — ATORVASTATIN CALCIUM 40 MG PO TABS: 40.0000 mg | ORAL_TABLET | Freq: Every day | ORAL | 1 refills | Status: DC

## 2023-12-01 ENCOUNTER — Encounter: Payer: Medicare HMO | Attending: Physical Medicine & Rehabilitation | Admitting: Physical Medicine & Rehabilitation

## 2023-12-01 ENCOUNTER — Encounter: Payer: Self-pay | Admitting: Physical Medicine & Rehabilitation

## 2023-12-01 VITALS — BP 138/89 | HR 88 | Ht 70.0 in | Wt 265.0 lb

## 2023-12-01 DIAGNOSIS — G8110 Spastic hemiplegia affecting unspecified side: Secondary | ICD-10-CM | POA: Diagnosis present

## 2023-12-01 DIAGNOSIS — G8191 Hemiplegia, unspecified affecting right dominant side: Secondary | ICD-10-CM | POA: Diagnosis present

## 2023-12-01 MED ORDER — ABOBOTULINUMTOXINA 300 UNITS IM SOLR
300.0000 [IU] | Freq: Once | INTRAMUSCULAR | Status: AC
  Administered 2023-12-01: 300 [IU] via INTRAMUSCULAR

## 2023-12-01 MED ORDER — ABOBOTULINUMTOXINA 500 UNITS IM SOLR
500.0000 [IU] | Freq: Once | INTRAMUSCULAR | Status: AC
  Administered 2023-12-01: 500 [IU] via INTRAMUSCULAR

## 2023-12-01 NOTE — Progress Notes (Signed)
 Dysport  Injection for spasticity using needle EMG guidance Indication:  Spastic hemiparesis affecting dominant side (HCC) - Plan: AbobotulinumtoxinA  (DYSPORT ) 500 units injection 500 Units, abobotulinumtoxinA  (DYSPORT ) 300 units injection 300 Units  Right hemiparesis (HCC) - Plan: AbobotulinumtoxinA  (DYSPORT ) 500 units injection 500 Units, abobotulinumtoxinA  (DYSPORT ) 300 units injection 300 Units   Dilution: 500 Units/2ml        Total Units Injected:  800 Indication: Severe spasticity which interferes with ADL,mobility and/or  hygiene and is unresponsive to medication management and other conservative care Informed consent was obtained after describing risks and benefits of the procedure with the patient. This includes bleeding, bruising, infection, excessive weakness, or medication side effects. A REMS form is on file and signed.  Right  Needle: 50mm injectable monopolar needle electrode  Number of units per muscle Pectoralis Major 0 units Pectoralis Minor 0 units Biceps 0 units Brachioradialis 0 units FCR 50 units FCU 50 units FDS 200 units FDP 200 units FPL 0 units Pronator Teres 300 units Pronator Quadratus 0 units Lumbricals 0 units  All injections were done after obtaining appropriate EMG activity and after negative drawback for blood. The patient tolerated the procedure well. Post procedure instructions were given.

## 2023-12-01 NOTE — Patient Instructions (Signed)
 ALWAYS FEEL FREE TO CALL OUR OFFICE WITH ANY PROBLEMS OR QUESTIONS [phone])  **PLEASE NOTE** ALL MEDICATION REFILL REQUESTS (INCLUDING CONTROLLED SUBSTANCES) NEED TO BE MADE AT LEAST 7 DAYS PRIOR TO REFILL BEING DUE. ANY REFILL REQUESTS INSIDE THAT TIME FRAME MAY RESULT IN DELAYS IN RECEIVING YOUR PRESCRIPTION.

## 2023-12-07 ENCOUNTER — Ambulatory Visit: Payer: Self-pay

## 2023-12-07 VITALS — Ht 69.0 in | Wt 263.0 lb

## 2023-12-07 DIAGNOSIS — Z0000 Encounter for general adult medical examination without abnormal findings: Secondary | ICD-10-CM

## 2023-12-07 NOTE — Progress Notes (Signed)
 Subjective:   Xavier Villegas is a 43 y.o. who presents for a Medicare Wellness preventive visit.  Visit Complete: Virtual I connected with  [name] on 12/07/23 by a audio enabled telemedicine application and verified that I am speaking with the correct person using two identifiers.  Patient Location: Home  Provider Location: Office/Clinic  I discussed the limitations of evaluation and management by telemedicine. The patient expressed understanding and agreed to proceed.  Vital Signs: Because this visit was a virtual/telehealth visit, some criteria may be missing or patient reported. Any vitals not documented were not able to be obtained and vitals that have been documented are patient reported.  VideoDeclined- This patient declined Librarian, academic. Therefore the visit was completed with audio only.  Persons Participating in Visit: Patient assisted by Aimee Houseman, father.  AWV Questionnaire: No: Patient Medicare AWV questionnaire was not completed prior to this visit.  Cardiac Risk Factors include: male gender;dyslipidemia;obesity (BMI >30kg/m2);sedentary lifestyle     Objective:    Today's Vitals   12/07/23 0926  Weight: 263 lb (119.3 kg)  Height: 5\' 9"  (1.753 m)   Body mass index is 38.84 kg/m.     12/07/2023    9:48 AM 12/01/2022    9:23 AM 11/05/2021    9:21 AM 08/28/2021   12:51 PM 03/21/2021    3:43 PM 02/07/2020   10:08 AM 04/26/2019    9:48 AM  Advanced Directives  Does Patient Have a Medical Advance Directive? No No No No No No No  Would patient like information on creating a medical advance directive? Yes (MAU/Ambulatory/Procedural Areas - Information given) No - Patient declined  No - Patient declined No - Patient declined No - Guardian declined;No - Patient declined Yes (MAU/Ambulatory/Procedural Areas - Information given)    Current Medications (verified) Outpatient Encounter Medications as of 12/07/2023   Medication Sig   aspirin  325 MG tablet Take 1 tablet (325 mg total) by mouth daily.   atorvastatin  (LIPITOR) 40 MG tablet Take 1 tablet (40 mg total) by mouth daily.   baclofen  (LIORESAL ) 20 MG tablet TAKE 1 TABLET FOUR TIMES DAILY   cholecalciferol (VITAMIN D3) 25 MCG (1000 UT) tablet Take 1,000 Units by mouth daily.   cyanocobalamin  1000 MCG tablet Take 1 tablet (1,000 mcg total) by mouth daily.   folic acid  (FOLVITE ) 1 MG tablet TAKE 1 TABLET FOUR TIMES DAILY   levETIRAcetam  (KEPPRA ) 750 MG tablet TAKE 1 TABLET TWICE DAILY   OVER THE COUNTER MEDICATION Take 2 capsules by mouth See admin instructions. Psyllium fiber 2 capsules by mouth everyday   pantoprazole  (PROTONIX ) 40 MG tablet Take 40 mg by mouth daily as needed (for GERD).   pyridOXINE  (B-6) 50 MG tablet Take 1 tablet (50 mg total) by mouth daily.   No facility-administered encounter medications on file as of 12/07/2023.    Allergies (verified) Patient has no known allergies.   History: Past Medical History:  Diagnosis Date   Aphasia    GERD (gastroesophageal reflux disease)    Hemiparesis (HCC) 02/2018   right side   Hyperhomocysteinemia (HCC)    Neurogenic bladder 02/2018   04/26/18 inporving   Prehypertension 07/22/2021   Spastic hemiparesis (HCC)    right side   Stroke (HCC)    Left MCA infart   Tobacco abuse    Past Surgical History:  Procedure Laterality Date   CRANIOTOMY Left 02/21/2018   Procedure: DECOMPRESSIVE CRANIECTOMY with bone flap to abdomen;  Surgeon: Gearl Keens, MD;  Location: [facility] OR;  Service: Neurosurgery;  Laterality: Left;  DECOMPRESSIVE CRANIECTOMY with bone flap to abdomen   CRANIOTOMY N/A 05/02/2018   Procedure: RE-IMPLANTATION OF CRANIAL FLAP;  Surgeon: Gearl Keens, MD;  Location[facility];  Service: Neurosurgery;  Laterality: N/A;  RE-IMPLANTATION OF CRANIAL FLAP   IR ANGIO INTRA EXTRACRAN SEL COM CAROTID INNOMINATE UNI R MOD SED  02/19/2018   IR ANGIO VERTEBRAL SEL VERTEBRAL BILAT MOD SED   02/19/2018   IR CT HEAD LTD  02/19/2018   IR PERCUTANEOUS ART THROMBECTOMY/INFUSION INTRACRANIAL INC DIAG ANGIO  02/19/2018   IR US  GUIDE VASC ACCESS RIGHT  02/19/2018   RADIOLOGY WITH ANESTHESIA N/A 02/19/2018   Procedure: IR WITH ANESTHESIA CODE STROKE;  Surgeon: Myrlene Asper, DO;  Location: [facility] OR;  Service: Anesthesiology;  Laterality: N/A;   TEE WITHOUT CARDIOVERSION N/A 03/01/2018   Procedure: TRANSESOPHAGEAL ECHOCARDIOGRAM (TEE);  Surgeon: Luana Rumple, MD;  Location: [facility] ENDOSCOPY;  Service: Cardiovascular;  Laterality: N/A;   Family History  Problem Relation Age of Onset   Arthritis Mother    Leukemia Mother        CLL/Dr. Thelma Fire at [facility]   Hypertension Father    Diabetes Father    Heart disease Maternal Grandmother    Cancer Maternal Grandfather    Liver cancer Paternal Grandmother    Cerebral aneurysm Paternal Grandfather    Social History   Socioeconomic History   Marital status: Divorced    Spouse name: Not on file   Number of children: 0   Years of education: Not on file   Highest education level: Not on file  Occupational History   Occupation: disability  Tobacco Use   Smoking status: Former    Current packs/day: 0.00    Average packs/day: 0.5 packs/day for 8.0 years (4.0 ttl pk-yrs)    Types: Cigarettes    Start date: 02/19/2010    Quit date: 02/19/2018    Years since quitting: 5.8   Smokeless tobacco: Never  Vaping Use   Vaping status: Never Used  Substance and Sexual Activity   Alcohol use: Not Currently    Alcohol/week: 14.0 standard drinks of alcohol    Types: 14 Cans of beer per week    Comment: last use 2019   Drug use: Never   Sexual activity: Yes  Other Topics Concern   Not on file  Social History Narrative   Not on file   Social Drivers of Health   Financial Resource Strain: Low Risk  (12/07/2023)   Overall Financial Resource Strain (CARDIA)    Difficulty of Paying Living Expenses: Not hard at all  Food Insecurity: No Food Insecurity  (12/07/2023)   Hunger Vital Sign    Worried About Running Out of Food in the Last Year: Never true    Ran Out of Food in the Last Year: Never true  Transportation Needs: No Transportation Needs (12/07/2023)   PRAPARE - Administrator, Civil Service (Medical): No    Lack of Transportation (Non-Medical): No  Physical Activity: Insufficiently Active (12/07/2023)   Exercise Vital Sign    Days of Exercise per Week: 7 days    Minutes of Exercise per Session: 20 min  Stress: No Stress Concern Present (12/07/2023)   [employer] of Occupational Health - Occupational Stress Questionnaire    Feeling of Stress : Not at all  Social Connections: Socially Isolated (12/07/2023)   Social Connection and Isolation Panel [NHANES]    Frequency of Communication with Friends and Family: More  than three times a week    Frequency of Social Gatherings with Friends and Family: Never    Attends Religious Services: Never    Database administrator or Organizations: No    Attends Engineer, structural: Never    Marital Status: Divorced    Tobacco Counseling Counseling given: Not Answered    Clinical Intake:  Pre-visit preparation completed: Yes  Pain : No/denies pain     BMI - recorded: 38.84 Nutritional Status: BMI > 30  Obese Nutritional Risks: None Diabetes: No  Lab Results  Component Value Date   HGBA1C 4.9 02/20/2018     How often do you need to have someone help you when you read instructions, pamphlets, or other written materials from your doctor or pharmacy?: 5 - Always (can't read due to stroke)  Interpreter Needed?: No  Information entered by :: Jaunita Messier, CMA   Activities of Daily Living     12/07/2023    9:31 AM  In your present state of health, do you have any difficulty performing the following activities:  Hearing? 1  Vision? 1  Comment no peripheral vision  Difficulty concentrating or making decisions? 1  Comment s/p stroke  Walking or climbing  stairs? 1  Comment uses a cane, s/p stroke  Dressing or bathing? 1  Comment father helps, s/p storke  Doing errands, shopping? 1  Comment father takes to appointments  Preparing Food and eating ? Y  Comment doesn't prepare food, but does ok with eating  Using the Toilet? N  In the past six months, have you accidently leaked urine? Y  Comment sometimes doesn't get to the bathroom in time  Do you have problems with loss of bowel control? N  Managing your Medications? Y  Comment father manages  Managing your Finances? Y  Comment father Chief Executive Officer or managing your Housekeeping? Y  Comment father manages    Patient Care Team: Aileen Alexanders, NP as PCP - General (Nurse Practitioner) Loyde Rule, MD as PCP - Cardiology (Cardiology)  Indicate any recent Medical Services you may have received from other than [facility] providers in the past year (date may be approximate).     Assessment:   This is a routine wellness examination for [employer].  Hearing/Vision screen Hearing Screening - Comments:: Has hearing loss per father Vision Screening - Comments:: Gets routine eye exams, Dr[facility] Bard [facility] [place], [place]   Goals Addressed               This Visit's Progress     Weight (lb) < 230 lb (104.3 kg) (pt-stated)   263 lb (119.3 kg)      Depression Screen     12/07/2023    9:43 AM 12/01/2023   10:11 AM 07/15/2023    9:27 AM 02/24/2023   10:44 AM 12/23/2022   10:26 AM 12/01/2022    9:22 AM 06/24/2022   11:29 AM  PHQ 2/9 Scores  PHQ - 2 Score 0 0 0 0 0 1 0  PHQ- 9 Score 1  4  9   0    Fall Risk     12/07/2023    9:50 AM 12/01/2023   10:11 AM 02/24/2023   10:43 AM 12/23/2022   10:26 AM 12/01/2022    9:24 AM  Fall Risk   Falls in the past year? 1 0 0 0 1  Number falls in past yr: 0 0 0 0 0  Injury with Fall? 0  0  0 0  Risk for fall due to : History of fall(s);Impaired balance/gait;Orthopedic patient   No Fall Risks History of fall(s);Other  (Comment)  Risk for fall due to: Comment     slipped on black ice  Follow up Falls prevention discussed;Falls evaluation completed;Education provided   Falls evaluation completed Falls prevention discussed    MEDICARE RISK AT HOME:  Medicare Risk at Home Any stairs in or around the home?: Yes If so, are there any without handrails?: No Home free of loose throw rugs in walkways, pet beds, electrical cords, etc?: Yes Adequate lighting in your home to reduce risk of falls?: Yes Life alert?: No Use of a cane, walker or w/c?: Yes (cane) Grab bars in the bathroom?: Yes Shower chair or bench in shower?: No Elevated toilet seat or a handicapped toilet?: No  TIMED UP AND GO:  Was the test performed?  No  Cognitive Function: 6CIT completed        12/07/2023    9:52 AM  6CIT Screen  What Year? 0 points  What month? 0 points  What time? 0 points  Count back from 20 4 points  Months in reverse 4 points  Repeat phrase 10 points  Total Score 18 points    Immunizations Immunization History  Administered Date(s) Administered   Influenza, Seasonal, Injecte, Preservative Fre 07/15/2023   Influenza,inj,Quad PF,6+ Mos 05/04/2018, 03/31/2019, 07/22/2021, 06/24/2022   PFIZER(Purple Top)SARS-COV-2 Vaccination 11/13/2019, 12/12/2019   Pneumococcal Polysaccharide-23 03/16/2018    Screening Tests Health Maintenance  Topic Date Due   Hepatitis C Screening  Never done   DTaP/Tdap/Td (1 - Tdap) Never done   Pneumococcal Vaccine 22-39 Years old (2 of 2 - PCV) 03/17/2019   COVID-19 Vaccine (3 - 2024-25 season) 04/11/2023   INFLUENZA VACCINE  03/10/2024   Medicare Annual Wellness (AWV)  12/06/2024   HIV Screening  Completed   HPV VACCINES  Aged Out   Meningococcal B Vaccine  Aged Out    Health Maintenance  Health Maintenance Due  Topic Date Due   Hepatitis C Screening  Never done   DTaP/Tdap/Td (1 - Tdap) Never done   Pneumococcal Vaccine 3-58 Years old (2 of 2 - PCV) 03/17/2019    COVID-19 Vaccine (3 - 2024-25 season) 04/11/2023   Health Maintenance Items Addressed: See Nurse Notes  Additional Screening:  Vision Screening: Recommended annual ophthalmology exams for early detection of glaucoma and other disorders of the eye.  Dental Screening: Recommended annual dental exams for proper oral hygiene  Community Resource Referral / Chronic Care Management: CRR required this visit?  No   CCM required this visit?  No     Plan:     I have personally reviewed and noted the following in the patient's chart:   Medical and social history Use of alcohol, tobacco or illicit drugs  Current medications and supplements including opioid prescriptions. Patient is not currently taking opioid prescriptions. Functional ability and status Nutritional status Physical activity Advanced directives List of other physicians Hospitalizations, surgeries, and ER visits in previous 12 months Vitals Screenings to include cognitive, depression, and falls Referrals and appointments  In addition, I have reviewed and discussed with patient certain preventive protocols, quality metrics, and best practice recommendations. A written personalized care plan for preventive services as well as general preventive health recommendations were provided to patient.     Jaunita Messier, CMA   12/07/2023   After Visit Summary: (MyChart) Due to this being a telephonic visit, the after  visit summary with patients personalized plan was offered to patient via MyChart   Notes:  Today's visit completed with the help of Charon Copper, father. 6 CIT Score - 18 (patient has problems with speaking and couldn't do some of the longer questions) Needs pneumonia vaccine Needs Tdap vaccine (pharmacy) Agreed to Hep C Screening with next blood work. Declined covid vaccine

## 2023-12-07 NOTE — Patient Instructions (Signed)
 [name] , Thank you for taking time to come for your Medicare Wellness Visit. I appreciate your ongoing commitment to your health goals. Please review the following plan we discussed and let me know if I can assist you in the future.   Referrals/Orders/Follow-Ups/Clinician Recommendations: Get a tetanus shot at your local pharmacy. Discuss with PCP the need for another pneumonia vaccine. Get the Hepatitis C Screening with you next bloodwork.  This is a list of the screening recommended for you and due dates:  Health Maintenance  Topic Date Due   Hepatitis C Screening  Never done   DTaP/Tdap/Td vaccine (1 - Tdap) Never done   Pneumococcal Vaccination (2 of 2 - PCV) 03/17/2019   COVID-19 Vaccine (3 - 2024-25 season) 04/11/2023   Flu Shot  03/10/2024   Medicare Annual Wellness Visit  12/06/2024   HIV Screening  Completed   HPV Vaccine  Aged Out   Meningitis B Vaccine  Aged Out    Advanced directives: (ACP Link)Information on Advanced Care Planning can be found at [place]  Print production planner Health Care Directives Advance Health Care Directives. [email or web address] You may also get these forms at your doctor's office.  Once you have completed the forms, please bring a copy of your health care power of attorney and living will to the office to be added to your chart at your convenience.   Next Medicare Annual Wellness Visit scheduled for next year: Yes, 12/19/24 @ 11:20am (phone visit)  Fall Prevention in the Home, Adult Falls can cause injuries and affect people of all ages. There are many simple things that you can do to make your home safe and to help prevent falls. If you need it, ask for help making these changes. What actions can I take to prevent falls? General information Use good lighting in all rooms. Make sure to: Replace any light bulbs that burn out. Turn on lights if it is dark and use night-lights. Keep items that you use often in easy-to-reach places. Lower the  shelves around your home if needed. Move furniture so that there are clear paths around it. Do not keep throw rugs or other things on the floor that can make you trip. If any of your floors are uneven, fix them. Add color or contrast paint or tape to clearly mark and help you see: Grab bars or handrails. First and last steps of staircases. Where the edge of each step is. If you use a ladder or stepladder: Make sure that it is fully opened. Do not climb a closed ladder. Make sure the sides of the ladder are locked in place. Have someone hold the ladder while you use it. Know where your pets are as you move through your home. What can I do in the bathroom?     Keep the floor dry. Clean up any water that is on the floor right away. Remove soap buildup in the bathtub or shower. Buildup makes bathtubs and showers slippery. Use non-skid mats or decals on the floor of the bathtub or shower. Attach bath mats securely with double-sided, non-slip rug tape. If you need to sit down while you are in the shower, use a non-slip stool. Install grab bars by the toilet and in the bathtub and shower. Do not use towel bars as grab bars. What can I do in the bedroom? Make sure that you have a light by your bed that is easy to reach. Do not use any sheets or blankets on your  bed that hang to the floor. Have a firm bench or chair with side arms that you can use for support when you get dressed. What can I do in the kitchen? Clean up any spills right away. If you need to reach something above you, use a sturdy step stool that has a grab bar. Keep electrical cables out of the way. Do not use floor polish or wax that makes floors slippery. What can I do with my stairs? Do not leave anything on the stairs. Make sure that you have a light switch at the top and the bottom of the stairs. Have them installed if you do not have them. Make sure that there are handrails on both sides of the stairs. Fix handrails  that are broken or loose. Make sure that handrails are as long as the staircases. Install non-slip stair treads on all stairs in your home if they do not have carpet. Avoid having throw rugs at the top or bottom of stairs, or secure the rugs with carpet tape to prevent them from moving. Choose a carpet design that does not hide the edge of steps on the stairs. Make sure that carpet is firmly attached to the stairs. Fix any carpet that is loose or worn. What can I do on the outside of my home? Use bright outdoor lighting. Repair the edges of walkways and driveways and fix any cracks. Clear paths of anything that can make you trip, such as tools or rocks. Add color or contrast paint or tape to clearly mark and help you see high doorway thresholds. Trim any bushes or trees on the main path into your home. Check that handrails are securely fastened and in good repair. Both sides of all steps should have handrails. Install guardrails along the edges of any raised decks or porches. Have leaves, snow, and ice cleared regularly. Use sand, salt, or ice melt on walkways during winter months if you live where there is ice and snow. In the garage, clean up any spills right away, including grease or oil spills. What other actions can I take? Review your medicines with your health care provider. Some medicines can make you confused or feel dizzy. This can increase your chance of falling. Wear closed-toe shoes that fit well and support your feet. Wear shoes that have rubber soles and low heels. Use a cane, walker, scooter, or crutches that help you move around if needed. Talk with your provider about other ways that you can decrease your risk of falls. This may include seeing a physical therapist to learn to do exercises to improve movement and strength. Where to find more information Centers for Disease Control and Prevention, STEAD[employer]: TonerPromos.no [employer] on Aging: BaseRingTones.pl National Institute on  Aging: BaseRingTones.pl Contact a health care provider if: You are afraid of falling at home. You feel weak, drowsy, or dizzy at home. You fall at home. Get help right away if you: Lose consciousness or have trouble moving after a fall. Have a fall that causes a head injury. These symptoms may be an emergency. Get help right away. Call 911. Do not wait to see if the symptoms will go away. Do not drive yourself to the hospital. This information is not intended to replace advice given to you by your health care provider. Make sure you discuss any questions you have with your health care provider. Document Revised: 03/30/2022 Document Reviewed: 03/30/2022 Elsevier Patient Education  2024 [employer].

## 2024-01-13 ENCOUNTER — Ambulatory Visit: Payer: Self-pay | Admitting: Nurse Practitioner

## 2024-01-13 ENCOUNTER — Encounter: Payer: Self-pay | Admitting: Nurse Practitioner

## 2024-01-13 VITALS — BP 137/88 | HR 80 | Ht 69.0 in | Wt 269.0 lb

## 2024-01-13 DIAGNOSIS — E669 Obesity, unspecified: Secondary | ICD-10-CM | POA: Diagnosis not present

## 2024-01-13 DIAGNOSIS — G8110 Spastic hemiplegia affecting unspecified side: Secondary | ICD-10-CM | POA: Diagnosis not present

## 2024-01-13 DIAGNOSIS — I69391 Dysphagia following cerebral infarction: Secondary | ICD-10-CM

## 2024-01-13 DIAGNOSIS — E538 Deficiency of other specified B group vitamins: Secondary | ICD-10-CM

## 2024-01-13 DIAGNOSIS — E785 Hyperlipidemia, unspecified: Secondary | ICD-10-CM

## 2024-01-13 DIAGNOSIS — Z8042 Family history of malignant neoplasm of prostate: Secondary | ICD-10-CM

## 2024-01-13 NOTE — Assessment & Plan Note (Signed)
 Chronic.  Controlled.  Continue with current medication regimen.  Labs ordered today.  Return to clinic in 6 months for reevaluation.  Call sooner if concerns arise.  ? ?

## 2024-01-13 NOTE — Progress Notes (Signed)
 BP 137/88   Pulse 80   Ht 5\' 9"  (1.753 m)   Wt 269 lb (122 kg)   BMI 39.72 kg/m    Subjective:    Patient ID: Xavier Villegas, male    DOB: 1980/08/15, 43 y.o.   MRN: [id number]  HPI: Xavier Villegas is a 43 y.o. male  Chief Complaint  Patient presents with   Medical Management of Chronic Issues   HYPERLIPIDEMIA Hyperlipidemia status: excellent compliance Satisfied with current treatment?  yes Side effects:  no Medication compliance: excellent compliance Past cholesterol meds: atorvastain (lipitor) Supplements: none Aspirin :  no The ASCVD Risk score (Arnett DK, et al., 2019) failed to calculate for the following reasons:   The patient has a prior MI or stroke diagnosis Chest pain:  no Coronary artery disease:  no Family history CAD:  no Family history early CAD:  no    Relevant past medical, surgical, family and social history reviewed and updated as indicated. Interim medical history since our last visit reviewed. Allergies and medications reviewed and updated.  Review of Systems  Eyes:  Negative for visual disturbance.  Respiratory:  Negative for chest tightness and shortness of breath.   Cardiovascular:  Negative for chest pain, palpitations and leg swelling.  Neurological:  Negative for dizziness, light-headedness and headaches.       Right sided weakness    Per HPI unless specifically indicated above     Objective:     BP 137/88   Pulse 80   Ht 5\' 9"  (1.753 m)   Wt 269 lb (122 kg)   BMI 39.72 kg/m   Wt Readings from Last 3 Encounters:  01/13/24 269 lb (122 kg)  12/07/23 263 lb (119.3 kg)  12/01/23 265 lb (120.2 kg)    Physical Exam Vitals and nursing note reviewed.  Constitutional:      General: He is not in acute distress.    Appearance: Normal appearance. He is obese. He is not ill-appearing, toxic-appearing or diaphoretic.  HENT:     Head: Normocephalic.     Right Ear: External ear normal.     Left Ear: External ear  normal.     Nose: Nose normal. No congestion or rhinorrhea.     Mouth/Throat:     Mouth: Mucous membranes are moist.  Eyes:     General:        Right eye: No discharge.        Left eye: No discharge.     Extraocular Movements: Extraocular movements intact.     Conjunctiva/sclera: Conjunctivae normal.     Pupils: Pupils are equal, round, and reactive to light.  Cardiovascular:     Rate and Rhythm: Normal rate and regular rhythm.     Heart sounds: No murmur heard. Pulmonary:     Effort: Pulmonary effort is normal. No respiratory distress.     Breath sounds: Normal breath sounds. No wheezing, rhonchi or rales.  Abdominal:     General: Abdomen is flat. Bowel sounds are normal.  Musculoskeletal:     Cervical back: Normal range of motion and neck supple.  Skin:    General: Skin is warm and dry.     Capillary Refill: Capillary refill takes less than 2 seconds.  Neurological:     General: No focal deficit present.     Mental Status: He is alert and oriented to person, place, and time.  Psychiatric:        Mood and Affect: Mood normal.  Behavior: Behavior normal.        Thought Content: Thought content normal.        Judgment: Judgment normal.     Results for orders placed or performed in visit on 07/15/23  TSH   Collection Time: 07/15/23  9:45 AM  Result Value Ref Range   TSH 2.020 0.450 - 4.500 uIU/mL  Lipid panel   Collection Time: 07/15/23  9:45 AM  Result Value Ref Range   Cholesterol, Total 111 100 - 199 mg/dL   Triglycerides 87 0 - 149 mg/dL   HDL 35 (L) >96 mg/dL   VLDL Cholesterol Cal 17 5 - 40 mg/dL   LDL Chol Calc (NIH) 59 0 - 99 mg/dL   Chol/HDL Ratio 3.2 0.0 - 5.0 ratio  CBC with Differential/Platelet   Collection Time: 07/15/23  9:45 AM  Result Value Ref Range   WBC 10.8 3.4 - 10.8 x10E3/uL   RBC 5.34 4.14 - 5.80 x10E6/uL   Hemoglobin 16.3 13.0 - 17.7 g/dL   Hematocrit 29.5 28.4 - 51.0 %   MCV 91 79 - 97 fL   MCH 30.5 26.6 - 33.0 pg   MCHC 33.5  31.5 - 35.7 g/dL   RDW 13.2 44.0 - 10.2 %   Platelets 224 150 - 450 x10E3/uL   Neutrophils 69 Not Estab. %   Lymphs 24 Not Estab. %   Monocytes 6 Not Estab. %   Eos 1 Not Estab. %   Basos 0 Not Estab. %   Neutrophils Absolute 7.4 (H) 1.4 - 7.0 x10E3/uL   Lymphocytes Absolute 2.6 0.7 - 3.1 x10E3/uL   Monocytes Absolute 0.6 0.1 - 0.9 x10E3/uL   EOS (ABSOLUTE) 0.1 0.0 - 0.4 x10E3/uL   Basophils Absolute 0.0 0.0 - 0.2 x10E3/uL   Immature Granulocytes 0 Not Estab. %   Immature Grans (Abs) 0.0 0.0 - 0.1 x10E3/uL  Comprehensive metabolic panel   Collection Time: 07/15/23  9:45 AM  Result Value Ref Range   Glucose 82 70 - 99 mg/dL   BUN 9 6 - 24 mg/dL   Creatinine, Ser 7.25 0.76 - 1.27 mg/dL   eGFR 97 >36 UY/QIH/4.74   BUN/Creatinine Ratio 9 9 - 20   Sodium 139 134 - 144 mmol/L   Potassium 4.8 3.5 - 5.2 mmol/L   Chloride 99 96 - 106 mmol/L   CO2 24 20 - 29 mmol/L   Calcium  9.4 8.7 - 10.2 mg/dL   Total Protein 7.3 6.0 - 8.5 g/dL   Albumin  4.4 4.1 - 5.1 g/dL   Globulin, Total 2.9 1.5 - 4.5 g/dL   Bilirubin Total 0.9 0.0 - 1.2 mg/dL   Alkaline Phosphatase 144 (H) 44 - 121 IU/L   AST 30 0 - 40 IU/L   ALT 39 0 - 44 IU/L  B12   Collection Time: 07/15/23  9:45 AM  Result Value Ref Range   Vitamin B-12 970 232 - 1,245 pg/mL  Microscopic Examination   Collection Time: 07/19/23  9:44 AM   Urine  Result Value Ref Range   WBC, UA None seen 0 - 5 /hpf   RBC, Urine None seen 0 - 2 /hpf   Epithelial Cells (non renal) None seen 0 - 10 /hpf   Crystals Present (A) N/A   Crystal Type Amorphous Sediment N/A   Bacteria, UA Moderate (A) None seen/Few  Urinalysis, Routine w reflex microscopic   Collection Time: 07/19/23  9:44 AM  Result Value Ref Range   Specific Gravity, UA 1.025 1.005 -  1.030   pH, UA 6.5 5.0 - 7.5   Color, UA Yellow Yellow   Appearance Ur Cloudy (A) Clear   Leukocytes,UA Negative Negative   Protein,UA Negative Negative/Trace   Glucose, UA Negative Negative   Ketones,  UA Negative Negative   RBC, UA Negative Negative   Bilirubin, UA Negative Negative   Urobilinogen, Ur 1.0 0.2 - 1.0 mg/dL   Nitrite, UA Negative Negative   Microscopic Examination See below:       Assessment & Plan:   Problem List Items Addressed This Visit       Digestive   Dysphagia, post-stroke (Chronic)   Chronic.  Controlled.  Continue with current medication regimen.  Labs ordered today.  Return to clinic in 6 months for reevaluation.  Call sooner if concerns arise.        Relevant Orders   Comp Met (CMET)     Nervous and Auditory   Spastic hemiparesis affecting dominant side (HCC) - Primary   Chronic.  Continues with Physical Therapy at [facility].  Receiving Botox injections every 3 months.  Last note received with Physical Medicine.      Relevant Orders   Comp Met (CMET)     Other   B12 deficiency   Labs ordered at visit today.  Will make recommendations based on lab results.        Relevant Orders   B12   CBC w/Diff   Hyperlipidemia   Chronic.  Controlled.  Continue with current medication regimen of Atorvastatin  daily.  Labs ordered today.  Return to clinic in 6 months for reevaluation.  Call sooner if concerns arise.       Relevant Orders   Lipid Profile   Obesity (BMI 30-39.9)   Recommended eating smaller high protein, low fat meals more frequently and exercising 30 mins a day 5 times a week with a goal of 10-15lb weight loss in the next 3 months.       Other Visit Diagnoses       Family history of prostate cancer       Relevant Orders   PSA        Follow up plan: No follow-ups on file.

## 2024-01-13 NOTE — Assessment & Plan Note (Signed)
 Labs ordered at visit today.  Will make recommendations based on lab results.

## 2024-01-13 NOTE — Assessment & Plan Note (Signed)
 Recommended eating smaller high protein, low fat meals more frequently and exercising 30 mins a day 5 times a week with a goal of 10-15lb weight loss in the next 3 months.

## 2024-01-13 NOTE — Assessment & Plan Note (Signed)
 Chronic.  Controlled.  Continue with current medication regimen of Atorvastatin daily.  Labs ordered today.  Return to clinic in 6 months for reevaluation.  Call sooner if concerns arise.

## 2024-01-13 NOTE — Assessment & Plan Note (Signed)
 Chronic.  Continues with Physical Therapy at [facility].  Receiving Botox injections every 3 months.  Last note received with Physical Medicine.

## 2024-01-14 LAB — LIPID PANEL

## 2024-01-15 LAB — LIPID PANEL
Chol/HDL Ratio: 3.1 ratio (ref 0.0–5.0)
Cholesterol, Total: 92 mg/dL — ABNORMAL LOW (ref 100–199)
HDL: 30 mg/dL — ABNORMAL LOW (ref >39)
LDL Chol Calc (NIH): 48 mg/dL (ref 0–99)
Triglycerides: 60 mg/dL (ref 0–149)
VLDL Cholesterol Cal: 14 mg/dL (ref 5–40)

## 2024-01-15 LAB — CBC WITH DIFFERENTIAL/PLATELET
Basophils Absolute: 0 10*3/uL (ref 0.0–0.2)
Basos: 0 %
EOS (ABSOLUTE): 0.1 10*3/uL (ref 0.0–0.4)
Eos: 1 %
Hematocrit: 48.1 % (ref 37.5–51.0)
Hemoglobin: 16.5 g/dL (ref 13.0–17.7)
Immature Grans (Abs): 0 10*3/uL (ref 0.0–0.1)
Immature Granulocytes: 0 %
Lymphocytes Absolute: 3.1 10*3/uL (ref 0.7–3.1)
Lymphs: 31 %
MCH: 31.9 pg (ref 26.6–33.0)
MCHC: 34.3 g/dL (ref 31.5–35.7)
MCV: 93 fL (ref 79–97)
Monocytes Absolute: 0.6 10*3/uL (ref 0.1–0.9)
Monocytes: 6 %
Neutrophils Absolute: 6 10*3/uL (ref 1.4–7.0)
Neutrophils: 62 %
Platelets: 224 10*3/uL (ref 150–450)
RBC: 5.17 x10E6/uL (ref 4.14–5.80)
RDW: 13.5 % (ref 11.6–15.4)
WBC: 9.9 10*3/uL (ref 3.4–10.8)

## 2024-01-15 LAB — COMPREHENSIVE METABOLIC PANEL WITH GFR
ALT: 30 IU/L (ref 0–44)
AST: 26 IU/L (ref 0–40)
Albumin: 4.4 g/dL (ref 4.1–5.1)
Alkaline Phosphatase: 160 IU/L — ABNORMAL HIGH (ref 44–121)
BUN/Creatinine Ratio: 9 (ref 9–20)
BUN: 9 mg/dL (ref 6–24)
Bilirubin Total: 0.6 mg/dL (ref 0.0–1.2)
CO2: 19 mmol/L — ABNORMAL LOW (ref 20–29)
Calcium: 9.4 mg/dL (ref 8.7–10.2)
Chloride: 101 mmol/L (ref 96–106)
Creatinine, Ser: 0.98 mg/dL (ref 0.76–1.27)
Globulin, Total: 2.9 g/dL (ref 1.5–4.5)
Glucose: 78 mg/dL (ref 70–99)
Potassium: 3.9 mmol/L (ref 3.5–5.2)
Sodium: 140 mmol/L (ref 134–144)
Total Protein: 7.3 g/dL (ref 6.0–8.5)
eGFR: 99 mL/min/{1.73_m2} (ref >59)

## 2024-01-15 LAB — PSA: Prostate Specific Ag, Serum: 0.2 ng/mL (ref 0.0–4.0)

## 2024-01-15 LAB — VITAMIN B12: Vitamin B-12: 1093 pg/mL (ref 232–1245)

## 2024-01-17 ENCOUNTER — Ambulatory Visit: Payer: Self-pay | Admitting: Nurse Practitioner

## 2024-02-02 NOTE — Progress Notes (Unsigned)
 [facility] [address] [place]. [place] [address] [phone]       OFFICE FOLLOW UP NOTE  [name] Date of Birth:  [birth year] Medical Record Number:  [id number]   Reason for visit: stroke and seizure follow up  CHIEF COMPLAINT:  No chief complaint on file.    HPI:  Update 02/03/2024 JM: Patient returns for yearly stroke and seizure follow-up.      History provided for reference purposes only Update 02/25/2023 JM: Patient returns for yearly stroke and seizure follow-up accompanied by his father.  Has been stable since prior visit without any new stroke/TIA symptoms and denies any seizure activity with ongoing use of Keppra .  Continue poststroke right spastic hemiparesis expressive aphasia visual impairment stable.  Continues to follow with PMR Dr. Babs receiving regular Dysport  injections with benefit.  Ambulates with cane outdoors, does not use indoors, denies any recent falls.  Continues on aspirin  and atorvastatin .  Routinely follows with PCP for stroke risk factor management.  No questions or concerns at this time.  Update 02/23/2022 JM: Patient returns for yearly stroke and seizure follow-up accompanied by his father.  Overall stable since prior visit.  Reports stable residual right spastic hemiparesis, expressive aphasia and visual impairment.  Continues to follow with Dr. Babs for botox.  He is looking in to Vivistim symptoms but father questions safety and use of Neuralink, a brain chip by [employer] recently approved to start trials in human subjects. He is currently investigating this further as he has read it can help paralysis, speech impairment and visual impairment. Denies new stroke/TIA symptoms.  No recent seizure activity.  Compliant on Keppra , denies side effects.  Compliant on aspirin , denies side effects.  Blood pressure today 141/85.  No new concerns at this time.  Update 02/27/2021 JM: [name] returns for yearly stroke  and seizure follow-up accompanied by his mother and father.  He has been stable since prior visit without new stroke/TIA symptoms or seizure activity.  Residual stroke deficits stable without worsening.  Continues to receive Botox by Dr. Babs PMR for right spastic hemiparesis with last injection on 5/4.  He has also been working with [facility] in [place], [place] with noted improvement of strength and spasticity.  Per father, his speech is also been improving at PT works with him on language exercises as well.  Compliant on aspirin  without associated side effects.  Compliant on Keppra  750 mg twice daily tolerating without side effects.  Blood pressure today 146/88.  No new concerns at this time.  Update 02/27/2020 JM: [name] returns for 55-month follow-up accompanied by his father.  Stable residual deficits of right spastic hemiparesis, expressive aphasia and visual impairment.  Denies new stroke/TIA symptoms.  Continues to receive Botox injections by PMR for spasticity.  No recurrent seizure activity and continues on Keppra  750 mg twice daily tolerating well without side effects.  Continues on aspirin  325 mg daily without bleeding or bruising.  Blood pressure today 133/82.  Remains on folic acid , B12 and B6 for management of hyper homocystinemia.  Recent lab work satisfactory.  No concerns at this time.  Update 08/29/2019: [name] is a 43 year old male who is being seen today for stroke and seizure follow-up.  He has been stable from a neurological standpoint with residual stroke deficits of right spastic hemiparesis, severe expressive aphasia and right homonymous hemianopia.  He was previously working with PT but this has been placed on hold back in 05/2019 due to  extensive dental procedure.  He continues to do exercises at home which were recommended by therapy as well as father helping with range of motion to help with spasticity.  He recently received Botox injections by Dr. Babs at [facility] but this  also has been placed on hold due to financial and insurance reasons.  His mother does endorse ongoing improvement of right hemiparesis with improvement of gait and at times is able to ambulate without AFO brace as well as increased strength of RUE.  He has not had any reoccurring seizure activity and continues on Keppra  750 mg twice daily tolerating well without side effects.  Continues on aspirin  325 mg daily without bleeding or bruising for secondary stroke prevention.  Blood pressure today 130/88.  He was also continued on B12, B6 and folate which was initiated during hospitalization due to evidence of hyperhomocystinemia, B12 deficiency folic acid  deficiency.  Repeat labs have not been performed since hospitalization.  No further concerns at this time.  Update 02/23/2019: [name] is a 43 year old male who is being seen today for follow-up regarding seizure activity on 10/15/2018 likely secondary to prior left MCA infarct.  He did undergo EEG on 10/31/2018 which did not show evidence of seizure activity.  He denies any recurrent seizure activity and continues on Keppra  750 mg twice daily without reported side effects. Underwent repeat carotid ultrasound on 10/27/2018 which showed bilateral ICA stenosis of 1 to 39% and VAs antegrade flow.  Residual stroke deficits right spastic hemiparesis, severe expressive aphasia and right homonymous hemianopsia stable. Continues to work with therapy thru [employer].  He was previously working with speech therapy at [facility] but has since been completed and is currently attempting to obtain speech machine.  Insurance will start paying for outpatient therapy again after 03/11/19. Blood pressure 132/91 which is normal for patient. Continues on aspirin  325mg  daily without bleeding or bruising. No further concerns at this time.   10/25/2018 visit: [name] is a 43 year old male who is being seen today for follow-up visit after admission to ED on 10/15/2018 after  witnessed seizure activity with history of left MCA infarct with small ICH/SDH hemorrhagic conversion s/p craniotomy.  Per ED notes, consisted of moving his right arm, sticking his tongue out and convulsing for approximately 2 to 3 minutes along with incontinence episode per mother.  Reported postictal state per mother.  He ultimately had recurrent seizure during admission lasting for less than 2 minutes.  CT head reviewed and was negative for acute intracranial abnormality and showed stable chronic large region of encephalomalacia in the left MCA territory with small chronic left frontal extra-axial collection deep to the left craniotomy. initiated Keppra  750 mg twice daily at discharge for seizure prophylaxis.  He denies any recurrent seizure activity since this time.  He has tolerated Keppra  without reported side effects.  He continues to have residual stroke deficits of right spastic hemiparesis, severe expressive aphasia and right homonymous hemianopia.  No further concerns at this time.  He does continue to take aspirin  325 mg without reported side effects of bleeding or bruising.  Blood pressure today satisfactory at 128/81.  He continues to live at home but is able to maintain ADLs independently.  He was previously participating at [employer] for physical and speech therapy as his insurance will not approve additional therapy sessions at this time.  Unfortunately, due to recent pandemic, these colleges have close at this time but he plans on restarting once able.  He returns today  for reevaluation and follow-up regarding recent seizure activity.   History 08/24/2018: Patient is being seen today for 62-month follow-up visit and is accompanied by his mother.  He has been doing well since prior visit with continued post stroke deficits of right spastic hemiparesis, severe expressive aphasia and right homonymous hemianopia. He continues to work with PT/OT at our neuro rehab clinic and does endorse some  improvement of his hemiparesis.  He currently is ambulating with a cane but typically will not use the cane in his home unless he is going up steps.  He is currently on baclofen  to assist with spasticity and he does endorse benefit from this.  He is unable to participate in further ST sessions due to insurance reasons and plans on looking into volunteering at a speech therapy school in hopes of obtaining additional ST.  He does follow with ophthalmology for peripheral vision exams and per mother, these have been stable.  He continues on aspirin  325 mg without side effects of bleeding or bruising.  Blood pressure today satisfactory 130/85.  No further concerns at this time.  Denies new or worsening stroke/TIA symptoms.  05/17/2018 visit: Patient is being seen today for hospital follow up and is accompanied by his aunt. Underwent re-implantation of cranial flap on 05/02/18 which was tolerated well without complication.  He continues to have right hemiparesis with spasticity and expressive aphasia but does feel as though all have been improving.  He participates at our neuro rehab clinic where he receives PT/OT/ST.  He is able to ambulate with quad cane and use of foot drop brace but denies any recent falls.  He is able to say a few words but for the most part nods head appropriately.  He also continues to have right homonymous hemianopia and is unsure if there has been any improvement.  He was discharged from hospital stay on aspirin  325 and has continued to take this without side effects of bleeding or bruising.  Blood pressure today satisfactory at 125/80.  He currently is living with his father for continued assistance but is able to bathe and dress himself independently.  Patient appears to be in good spirits regarding recovery progress and the future.  Denies any depression-like symptoms or anxiety.  No further concerns at this time.  Denies new or worsening stroke/TIA symptoms.  03/01/2018 hospital  admission: Mr. Jayvien Rowlette is a 43 y.o. male with no significant past medical history other than tobacco use who presented with right-sided weakness and aphasia. He did not receive IV t-PA due to late presentation.  CT head reviewed and showed large territory acute infarct left MCA territory.  Due to these findings, he was taken to MRI for consideration of IR if his infarct burden appears less than estimated by CTP but unfortunately due to other patient care issues there would have been a significant delay therefore patient was taken to IR for intervention.  Patient underwent attempted thrombectomy for occlusive left ICA which resulted in left ICA dissection and possible ICH.  Postprocedure CT showed subarachnoid hemorrhage with hemorrhagic transformation.  MRI head reviewed and showed large left MCA infarct with small ICH/SDH hemorrhagic conversion.  MRA head showed left ICA and MCA occlusion.  CTA head and neck showed acute dissection left internal carotid artery with intraluminal thrombus along with 2 mm midline shift.  Repeat CT showed progressive cytotoxic edema with slight mass-effect and midline shift where he required left decompressive hemicraniotomy as well as hypertonic saline for management of edema.  Repeat CT scan showed improvement after treatment. Lower extremity venous Dopplers were negative for DVT.  Carotid Doppler showed left ICA occlusion.  2D echo showed an EF of 55 to 60% with aortic valve mass versus vegetation.  TEE was normal without evidence of vegetation.  Hypercoagulable work-up did show high homocystine at 181.7 and he was started on high-dose folate and B6 as well as B12 injection.  LDL 46 and A1c 4.9.  Patient was discharged to CIR for continuation of therapy in stable condition. Patient was discharged home on 04/06/18 with 24 hr supervision.       ROS:   14 system review of systems performed and negative with exception of see HPI    PMH:  Past Medical History:   Diagnosis Date   Aphasia    GERD (gastroesophageal reflux disease)    Hemiparesis (HCC) 02/2018   right side   Hyperhomocysteinemia (HCC)    Neurogenic bladder 02/2018   04/26/18 inporving   Prehypertension 07/22/2021   Spastic hemiparesis (HCC)    right side   Stroke (HCC)    Left MCA infart   Tobacco abuse     PSH:  Past Surgical History:  Procedure Laterality Date   CRANIOTOMY Left 02/21/2018   Procedure: DECOMPRESSIVE CRANIECTOMY with bone flap to abdomen;  Surgeon: Onetha Kuba, MD;  Location[facility] OR;  Service: Neurosurgery;  Laterality: Left;  DECOMPRESSIVE CRANIECTOMY with bone flap to abdomen   CRANIOTOMY N/A 05/02/2018   Procedure: RE-IMPLANTATION OF CRANIAL FLAP;  Surgeon: Onetha Kuba, MD;  Location: [facility] OR;  Service: Neurosurgery;  Laterality: N/A;  RE-IMPLANTATION OF CRANIAL FLAP   IR ANGIO INTRA EXTRACRAN SEL COM CAROTID INNOMINATE UNI R MOD SED  02/19/2018   IR ANGIO VERTEBRAL SEL VERTEBRAL BILAT MOD SED  02/19/2018   IR CT HEAD LTD  02/19/2018   IR PERCUTANEOUS ART THROMBECTOMY/INFUSION INTRACRANIAL INC DIAG ANGIO  02/19/2018   IR US  GUIDE VASC ACCESS RIGHT  02/19/2018   RADIOLOGY WITH ANESTHESIA N/A 02/19/2018   Procedure: IR WITH ANESTHESIA CODE STROKE;  Surgeon: Alona Corners, DO;  Location: [facility] OR;  Service: Anesthesiology;  Laterality: N/A;   TEE WITHOUT CARDIOVERSION N/A 03/01/2018   Procedure: TRANSESOPHAGEAL ECHOCARDIOGRAM (TEE);  Surgeon: Francyne Headland, MD;  Location: [facility] ENDOSCOPY;  Service: Cardiovascular;  Laterality: N/A;    Social History:  Social History   Socioeconomic History   Marital status: Divorced    Spouse name: Not on file   Number of children: 0   Years of education: Not on file   Highest education level: Not on file  Occupational History   Occupation: disability  Tobacco Use   Smoking status: Former    Current packs/day: 0.00    Average packs/day: 0.5 packs/day for 8.0 years (4.0 ttl pk-yrs)    Types: Cigarettes    Start date: 02/19/2010     Quit date: 02/19/2018    Years since quitting: 5.9   Smokeless tobacco: Never  Vaping Use   Vaping status: Never Used  Substance and Sexual Activity   Alcohol use: Not Currently    Alcohol/week: 14.0 standard drinks of alcohol    Types: 14 Cans of beer per week    Comment: last use 2019   Drug use: Never   Sexual activity: Yes  Other Topics Concern   Not on file  Social History Narrative   Not on file   Social Drivers of Health   Financial Resource Strain: Low Risk  (12/07/2023)   Overall Financial Resource Strain (CARDIA)  Difficulty of Paying Living Expenses: Not hard at all  Food Insecurity: No Food Insecurity (12/07/2023)   Hunger Vital Sign    Worried About Running Out of Food in the Last Year: Never true    Ran Out of Food in the Last Year: Never true  Transportation Needs: No Transportation Needs (12/07/2023)   PRAPARE - Administrator, Civil Service (Medical): No    Lack of Transportation (Non-Medical): No  Physical Activity: Insufficiently Active (12/07/2023)   Exercise Vital Sign    Days of Exercise per Week: 7 days    Minutes of Exercise per Session: 20 min  Stress: No Stress Concern Present (12/07/2023)   [employer] of Occupational Health - Occupational Stress Questionnaire    Feeling of Stress : Not at all  Social Connections: Socially Isolated (12/07/2023)   Social Connection and Isolation Panel    Frequency of Communication with Friends and Family: More than three times a week    Frequency of Social Gatherings with Friends and Family: Never    Attends Religious Services: Never    Database administrator or Organizations: No    Attends Banker Meetings: Never    Marital Status: Divorced  Catering manager Violence: Not At Risk (12/07/2023)   Humiliation, Afraid, Rape, and Kick questionnaire    Fear of Current or Ex-Partner: No    Emotionally Abused: No    Physically Abused: No    Sexually Abused: No    Family History:  Family  History  Problem Relation Age of Onset   Arthritis Mother    Leukemia Mother        CLL/Dr. DeCastro at [facility]   Hypertension Father    Diabetes Father    Heart disease Maternal Grandmother    Cancer Maternal Grandfather    Liver cancer Paternal Grandmother    Cerebral aneurysm Paternal Grandfather     Medications:   Current Outpatient Medications on File Prior to Visit  Medication Sig Dispense Refill   aspirin  325 MG tablet Take 1 tablet (325 mg total) by mouth daily.     atorvastatin  (LIPITOR) 40 MG tablet Take 1 tablet (40 mg total) by mouth daily. 90 tablet 1   baclofen  (LIORESAL ) 20 MG tablet TAKE 1 TABLET FOUR TIMES DAILY 360 tablet 3   cholecalciferol (VITAMIN D3) 25 MCG (1000 UT) tablet Take 1,000 Units by mouth daily.     cyanocobalamin  1000 MCG tablet Take 1 tablet (1,000 mcg total) by mouth daily. 30 tablet 2   folic acid  (FOLVITE ) 1 MG tablet TAKE 1 TABLET FOUR TIMES DAILY 360 tablet 3   levETIRAcetam  (KEPPRA ) 750 MG tablet TAKE 1 TABLET TWICE DAILY 180 tablet 3   OVER THE COUNTER MEDICATION Take 2 capsules by mouth See admin instructions. Psyllium fiber 2 capsules by mouth everyday     pantoprazole  (PROTONIX ) 40 MG tablet Take 40 mg by mouth daily as needed (for GERD).     pyridOXINE  (B-6) 50 MG tablet Take 1 tablet (50 mg total) by mouth daily. 30 tablet 2   No current facility-administered medications on file prior to visit.    Allergies:  No Known Allergies   Physical Exam  There were no vitals filed for this visit.  There is no height or weight on file to calculate BMI. No results found.  General: well developed, well nourished, pleasant young Caucasian male, seated, in no evident distress Head: head normocephalic and atraumatic.   Neck: supple with no carotid or supraclavicular  bruits Cardiovascular: regular rate and rhythm, no murmurs Musculoskeletal: no deformity Skin:  no rash/petichiae Vascular:  Normal pulses all extremities  Neurologic  Exam Mental Status: Awake and fully alert.  Severe expressive aphasia.  Nods appropriately to yes/no questions - able to say simple words.  Oriented to place and time. Recent and remote memory intact. Attention span, concentration and fund of knowledge appropriate. Mood and affect appropriate.  Cranial Nerves: Pupils equal, briskly reactive to light. Extraocular movements full without nystagmus. Visual fields partial right homonymous hemianopia.SABRA Hearing intact.  Right-sided facial sensation decreased compared to left side.  Lower facial paralysis right side.  Motor: RUE: 4/5 with spasticity throughout greatest in hand with limited movement, RLE: 4+/5 hip flexor, 4/5 quad, foot drop with use of AFO.  Full strength left upper and lower extremity Sensory.:  Decreased light touch, pinprick and vibratory sensation right upper and lower extremity compared to left upper and lower extremity Coordination: Rapid alternating movements normal on left side. Finger-to-nose and heel-to-shin performed accurately on left side. Gait and Station: Arises from chair without difficulty. Stance is normal. Gait demonstrates hemiplegic gait with use of cane and AFO device Reflexes: 1+ left upper and lower extremity.  2+ right upper and lower extremity.  Toes downgoing.        ASSESSMENT: Xavier Villegas is a 43 y.o. year old male here with large left MCA infarct on 02/19/18 secondary to left ICA dissection s/p [id number] revascularization of L MCA M1/2 followed by decompressive craniectomy. Vascular risk factors include tobacco use, EtOH use, HTN and severe hyperhomocysteinemia.  He unfortunately has sustained recent seizure activity on 10/15/2018 likely related to prior large left MCA infarct and was initiated on Keppra  for seizure prophylaxis.     Left MCA stroke  -Residual right spastic hemiparesis, right homonymous hemianopia and expressive aphasia -stable.  Continue to follow with PMR for spasticity management with  Dysport  injections and baclofen   -Continue aspirin  325 mg daily and atorvastatin  for secondary stroke prevention -Close follow-up with PCP for aggressive stroke risk factor management including HTN with BP goal<130/90 and HLD with LDL goal<70 -Lipid panel LDL 45 (12/2022)  Hyperhomocystinemia -Continue B12, B6 and folic acid  -B12 1033 (12/2022) -Continue to follow with PCP for ongoing monitoring  Seizure, late effect of stroke -Onset 10/15/2018 -No recurrent seizure activity -Continue Keppra  750 mg twice daily for seizure prophylaxis -refill up to date by PMR -CBC and CMP per PCP     Follow-up in 1 year or call earlier if needed    I personally spent a total of *** minutes in the care of the patient today including {Time Based Coding:210964241}.   Harlene Bogaert, AGNP-BC  [facility] [address] [place], [place] [address]  Phone [phone] Fax [phone] Note: This document was prepared with digital dictation and possible smart phrase technology. Any transcriptional errors that result from this process are unintentional.

## 2024-02-03 ENCOUNTER — Encounter: Payer: Self-pay | Admitting: Adult Health

## 2024-02-03 ENCOUNTER — Ambulatory Visit (INDEPENDENT_AMBULATORY_CARE_PROVIDER_SITE_OTHER): Admitting: Adult Health

## 2024-02-03 VITALS — BP 122/86 | HR 80 | Ht 67.0 in | Wt 268.0 lb

## 2024-02-03 DIAGNOSIS — G8111 Spastic hemiplegia affecting right dominant side: Secondary | ICD-10-CM | POA: Diagnosis not present

## 2024-02-03 DIAGNOSIS — R569 Unspecified convulsions: Secondary | ICD-10-CM

## 2024-02-03 DIAGNOSIS — Z8673 Personal history of transient ischemic attack (TIA), and cerebral infarction without residual deficits: Secondary | ICD-10-CM | POA: Diagnosis not present

## 2024-02-03 DIAGNOSIS — E7211 Homocystinuria: Secondary | ICD-10-CM

## 2024-02-03 MED ORDER — LEVETIRACETAM 750 MG PO TABS: 750.0000 mg | ORAL_TABLET | Freq: Two times a day (BID) | ORAL | 3 refills | Status: AC

## 2024-02-03 NOTE — Patient Instructions (Addendum)
 Continue Keppra  750 mg twice daily for seizure prevention  Continue aspirin  325 mg daily  and atorvastatin  for secondary stroke prevention  Continue to follow up with PCP regarding cholesterol management  Maintain strict control of cholesterol with LDL cholesterol (bad cholesterol) goal below 70 mg/dL.   Signs of a Stroke? Follow the BEFAST method:  Balance Watch for a sudden loss of balance, trouble with coordination or vertigo Eyes Is there a sudden loss of vision in one or both eyes? Or double vision?  Face: Ask the person to smile. Does one side of the face droop or is it numb?  Arms: Ask the person to raise both arms. Does one arm drift downward? Is there weakness or numbness of a leg? Speech: Ask the person to repeat a simple phrase. Does the speech sound slurred/strange? Is the person confused ? Time: If you observe any of these signs, call 911.      Followup in the future with me in 1 year or call earlier if needed      Thank you for coming to see us  at [facility]. I hope we have been able to provide you high quality care today.  You may receive a patient satisfaction survey over the next few weeks. We would appreciate your feedback and comments so that we may continue to improve ourselves and the health of our patients.

## 2024-02-16 ENCOUNTER — Other Ambulatory Visit: Payer: Self-pay | Admitting: Nurse Practitioner

## 2024-02-16 DIAGNOSIS — E785 Hyperlipidemia, unspecified: Secondary | ICD-10-CM

## 2024-02-17 NOTE — Telephone Encounter (Signed)
 Requested Prescriptions  Pending Prescriptions Disp Refills   atorvastatin  (LIPITOR) 40 MG tablet [Pharmacy Med Name: Atorvastatin  Calcium  Oral Tablet 40 MG] 90 tablet 3    Sig: TAKE 1 TABLET EVERY DAY     Cardiovascular:  Antilipid - Statins Failed - 02/17/2024  5:56 PM      Failed - Lipid Panel in normal range within the last 12 months    Cholesterol, Total  Date Value Ref Range Status  01/13/2024 92 (L) 100 - 199 mg/dL Final   LDL Chol Calc (NIH)  Date Value Ref Range Status  01/13/2024 48 0 - 99 mg/dL Final   HDL  Date Value Ref Range Status  01/13/2024 30 (L) >39 mg/dL Final   Triglycerides  Date Value Ref Range Status  01/13/2024 60 0 - 149 mg/dL Final         Passed - Patient is not pregnant      Passed - Valid encounter within last 12 months    Recent Outpatient Visits           1 month ago Spastic hemiparesis affecting dominant side [facility])   [place] [facility] Melvin Pao, NP

## 2024-03-01 ENCOUNTER — Encounter: Attending: Physical Medicine & Rehabilitation | Admitting: Physical Medicine & Rehabilitation

## 2024-03-01 ENCOUNTER — Encounter: Payer: Self-pay | Admitting: Physical Medicine & Rehabilitation

## 2024-03-01 VITALS — BP 142/95 | HR 77 | Ht 67.0 in | Wt 269.6 lb

## 2024-03-01 DIAGNOSIS — G8110 Spastic hemiplegia affecting unspecified side: Secondary | ICD-10-CM | POA: Insufficient documentation

## 2024-03-01 MED ORDER — ABOBOTULINUMTOXINA 300 UNITS IM SOLR
300.0000 [IU] | Freq: Once | INTRAMUSCULAR | Status: AC
Start: 2024-03-01 — End: 2024-03-01
  Administered 2024-03-01: 300 [IU] via INTRAMUSCULAR

## 2024-03-01 MED ORDER — ABOBOTULINUMTOXINA 500 UNITS IM SOLR
500.0000 [IU] | Freq: Once | INTRAMUSCULAR | Status: AC
  Administered 2024-03-01: 500 [IU] via INTRAMUSCULAR

## 2024-03-01 MED ORDER — SODIUM CHLORIDE (PF) 0.9 % IJ SOLN
4.0000 mL | Freq: Once | INTRAMUSCULAR | Status: AC
  Administered 2024-03-01: 4 mL

## 2024-03-01 NOTE — Progress Notes (Signed)
 Dysport  Injection for spasticity using needle EMG guidance Indication:  Spastic hemiparesis affecting dominant side (HCC) - Plan: AbobotulinumtoxinA  (DYSPORT ) 500 units injection 500 Units, abobotulinumtoxinA  (DYSPORT ) 300 units injection 300 Units, sodium chloride  (PF) 0.9 % injection 4 mL   Dilution: 500 Units/2ml        Total Units Injected:  800 Indication: Severe spasticity which interferes with ADL,mobility and/or  hygiene and is unresponsive to medication management and other conservative care Informed consent was obtained after describing risks and benefits of the procedure with the patient. This includes bleeding, bruising, infection, excessive weakness, or medication side effects. A REMS form is on file and signed.  0 Needle: 50mm injectable monopolar needle electrode  Number of units per muscle Pectoralis Major 0 units Pectoralis Minor 0 units Biceps 150 units Brachioradialis 50 units FCR 50 units FCU 50 units FDS 150 units FDP 150 units FPL 0 units Pronator Teres 200 units Pronator Quadratus 0 units Lumbricals 0 units  All injections were done after obtaining appropriate EMG activity and after negative drawback for blood. The patient tolerated the procedure well. Post procedure instructions were given.

## 2024-03-01 NOTE — Patient Instructions (Signed)
 ALWAYS FEEL FREE TO CALL OUR OFFICE WITH ANY PROBLEMS OR QUESTIONS [phone])  **PLEASE NOTE** ALL MEDICATION REFILL REQUESTS (INCLUDING CONTROLLED SUBSTANCES) NEED TO BE MADE AT LEAST 7 DAYS PRIOR TO REFILL BEING DUE. ANY REFILL REQUESTS INSIDE THAT TIME FRAME MAY RESULT IN DELAYS IN RECEIVING YOUR PRESCRIPTION.

## 2024-03-02 ENCOUNTER — Ambulatory Visit: Payer: Medicare HMO | Admitting: Adult Health

## 2024-05-14 ENCOUNTER — Other Ambulatory Visit: Payer: Self-pay | Admitting: Physical Medicine & Rehabilitation

## 2024-05-14 DIAGNOSIS — G8110 Spastic hemiplegia affecting unspecified side: Secondary | ICD-10-CM

## 2024-05-19 ENCOUNTER — Other Ambulatory Visit: Payer: Self-pay | Admitting: Physical Medicine & Rehabilitation

## 2024-05-19 DIAGNOSIS — E539 Vitamin B deficiency, unspecified: Secondary | ICD-10-CM

## 2024-05-19 DIAGNOSIS — I63412 Cerebral infarction due to embolism of left middle cerebral artery: Secondary | ICD-10-CM

## 2024-06-07 ENCOUNTER — Encounter: Attending: Physical Medicine & Rehabilitation | Admitting: Physical Medicine & Rehabilitation

## 2024-06-07 ENCOUNTER — Encounter: Payer: Self-pay | Admitting: Physical Medicine & Rehabilitation

## 2024-06-07 VITALS — BP 158/107 | HR 79 | Ht 67.0 in | Wt 270.0 lb

## 2024-06-07 DIAGNOSIS — G8110 Spastic hemiplegia affecting unspecified side: Secondary | ICD-10-CM | POA: Diagnosis not present

## 2024-06-07 MED ORDER — ABOBOTULINUMTOXINA 500 UNITS IM SOLR
500.0000 [IU] | Freq: Once | INTRAMUSCULAR | Status: AC
  Administered 2024-06-07: 500 [IU] via INTRAMUSCULAR

## 2024-06-07 MED ORDER — SODIUM CHLORIDE (PF) 0.9 % IJ SOLN
4.0000 mL | Freq: Once | INTRAMUSCULAR | Status: AC
  Administered 2024-06-07: 4 mL

## 2024-06-07 MED ORDER — ABOBOTULINUMTOXINA 300 UNITS IM SOLR
300.0000 [IU] | Freq: Once | INTRAMUSCULAR | Status: AC
  Administered 2024-06-07: 300 [IU] via INTRAMUSCULAR

## 2024-06-07 NOTE — Progress Notes (Signed)
 Dysport  Injection for spasticity using needle EMG guidance Indication:  Spastic hemiparesis affecting dominant side (HCC) - Plan: AbobotulinumtoxinA  (DYSPORT ) 500 units injection 500 Units, abobotulinumtoxinA  (DYSPORT ) 300 units injection 300 Units, sodium chloride  (PF) 0.9 % injection 4 mL   Dilution: 500 Units/2ml        Total Units Injected:  800 Indication: Severe spasticity which interferes with ADL,mobility and/or  hygiene and is unresponsive to medication management and other conservative care Informed consent was obtained after describing risks and benefits of the procedure with the patient. This includes bleeding, bruising, infection, excessive weakness, or medication side effects. A REMS form is on file and signed.  right Needle: 50mm injectable monopolar needle electrode  Number of units per muscle Pectoralis Major 150 units Pectoralis Minor 150 units Biceps 0 units Brachioradialis 0 units FCR 50 units FCU 50 units FDS 150 units FDP 150 units FPL 0 units Pronator Teres 100 units Pronator Quadratus 0 units Lumbricals 0 units  All injections were done after obtaining appropriate EMG activity and after negative drawback for blood. The patient tolerated the procedure well. Post procedure instructions were given.

## 2024-06-07 NOTE — Patient Instructions (Signed)
 ALWAYS FEEL FREE TO CALL OUR OFFICE WITH ANY PROBLEMS OR QUESTIONS [phone])  **PLEASE NOTE** ALL MEDICATION REFILL REQUESTS (INCLUDING CONTROLLED SUBSTANCES) NEED TO BE MADE AT LEAST 7 DAYS PRIOR TO REFILL BEING DUE. ANY REFILL REQUESTS INSIDE THAT TIME FRAME MAY RESULT IN DELAYS IN RECEIVING YOUR PRESCRIPTION.

## 2024-07-14 ENCOUNTER — Encounter: Payer: Self-pay | Admitting: Nurse Practitioner

## 2024-07-14 ENCOUNTER — Ambulatory Visit: Admitting: Nurse Practitioner

## 2024-07-14 VITALS — BP 138/89 | HR 71 | Temp 98.6°F | Resp 15 | Ht 67.01 in | Wt 267.4 lb

## 2024-07-14 DIAGNOSIS — G8110 Spastic hemiplegia affecting unspecified side: Secondary | ICD-10-CM

## 2024-07-14 DIAGNOSIS — E669 Obesity, unspecified: Secondary | ICD-10-CM

## 2024-07-14 DIAGNOSIS — Z23 Encounter for immunization: Secondary | ICD-10-CM

## 2024-07-14 DIAGNOSIS — G8191 Hemiplegia, unspecified affecting right dominant side: Secondary | ICD-10-CM | POA: Diagnosis not present

## 2024-07-14 DIAGNOSIS — E785 Hyperlipidemia, unspecified: Secondary | ICD-10-CM | POA: Diagnosis not present

## 2024-07-14 DIAGNOSIS — Z1159 Encounter for screening for other viral diseases: Secondary | ICD-10-CM

## 2024-07-14 MED ORDER — ATORVASTATIN CALCIUM 40 MG PO TABS: 40.0000 mg | ORAL_TABLET | Freq: Every day | ORAL | 1 refills | Status: AC

## 2024-07-14 NOTE — Progress Notes (Signed)
 BP 138/89 (BP Location: Right Arm, Patient Position: Sitting, Cuff Size: Normal)   Pulse 71   Temp 98.6 F (37 C) (Oral)   Resp 15   Ht 5' 7.01 (1.702 m)   Wt 267 lb 6.4 oz (121.3 kg)   SpO2 98%   BMI 41.87 kg/m    Subjective:    Patient ID: Xavier Villegas, male    DOB: [birth year], 43 y.o.   MRN: [id number]  HPI: Xavier Villegas is a 43 y.o. male  Chief Complaint  Patient presents with   Follow-up    Everything is still about the same.    HYPERLIPIDEMIA Hyperlipidemia status: excellent compliance Satisfied with current treatment?  yes Side effects:  no Medication compliance: excellent compliance Past cholesterol meds: atorvastain (lipitor) Supplements: none Aspirin :  no The ASCVD Risk score (Arnett DK, et al., 2019) failed to calculate for the following reasons:   The patient has a prior MI or stroke diagnosis Chest pain:  no Coronary artery disease:  no Family history CAD:  no Family history early CAD:  no  Still receiving Botox injections with physical medicine every 3 months.  Still feels like it is helping with the contractions patient was seeing.   Relevant past medical, surgical, family and social history reviewed and updated as indicated. Interim medical history since our last visit reviewed. Allergies and medications reviewed and updated.  Review of Systems  Eyes:  Negative for visual disturbance.  Respiratory:  Negative for chest tightness and shortness of breath.   Cardiovascular:  Negative for chest pain, palpitations and leg swelling.  Musculoskeletal:        Right arm weakness and spasticity   Neurological:  Negative for dizziness, light-headedness and headaches.       Right sided weakness    Per HPI unless specifically indicated above     Objective:     BP 138/89 (BP Location: Right Arm, Patient Position: Sitting, Cuff Size: Normal)   Pulse 71   Temp 98.6 F (37 C) (Oral)   Resp 15   Ht 5' 7.01 (1.702 m)   Wt 267 lb  6.4 oz (121.3 kg)   SpO2 98%   BMI 41.87 kg/m   Wt Readings from Last 3 Encounters:  07/14/24 267 lb 6.4 oz (121.3 kg)  06/07/24 270 lb (122.5 kg)  03/01/24 269 lb 9.6 oz (122.3 kg)    Physical Exam Vitals and nursing note reviewed.  Constitutional:      General: He is not in acute distress.    Appearance: Normal appearance. He is obese. He is not ill-appearing, toxic-appearing or diaphoretic.  HENT:     Head: Normocephalic.     Right Ear: External ear normal.     Left Ear: External ear normal.     Nose: Nose normal. No congestion or rhinorrhea.     Mouth/Throat:     Mouth: Mucous membranes are moist.  Eyes:     General:        Right eye: No discharge.        Left eye: No discharge.     Extraocular Movements: Extraocular movements intact.     Conjunctiva/sclera: Conjunctivae normal.     Pupils: Pupils are equal, round, and reactive to light.  Cardiovascular:     Rate and Rhythm: Normal rate and regular rhythm.     Heart sounds: No murmur heard. Pulmonary:     Effort: Pulmonary effort is normal. No respiratory distress.     Breath sounds: Normal  breath sounds. No wheezing, rhonchi or rales.  Abdominal:     General: Abdomen is flat. Bowel sounds are normal.  Musculoskeletal:     Cervical back: Normal range of motion and neck supple.     Comments: Right sided weakness. Walks with a cane  Skin:    General: Skin is warm and dry.     Capillary Refill: Capillary refill takes less than 2 seconds.  Neurological:     General: No focal deficit present.     Mental Status: He is alert and oriented to person, place, and time.  Psychiatric:        Mood and Affect: Mood normal.        Behavior: Behavior normal.        Thought Content: Thought content normal.        Judgment: Judgment normal.     Results for orders placed or performed in visit on 01/13/24  Comp Met (CMET)   Collection Time: 01/13/24 10:23 AM  Result Value Ref Range   Glucose 78 70 - 99 mg/dL   BUN 9 6 - 24  mg/dL   Creatinine, Ser 9.01 0.76 - 1.27 mg/dL   eGFR 99 >40 fO/fpw/8.26   BUN/Creatinine Ratio 9 9 - 20   Sodium 140 134 - 144 mmol/L   Potassium 3.9 3.5 - 5.2 mmol/L   Chloride 101 96 - 106 mmol/L   CO2 19 (L) 20 - 29 mmol/L   Calcium  9.4 8.7 - 10.2 mg/dL   Total Protein 7.3 6.0 - 8.5 g/dL   Albumin  4.4 4.1 - 5.1 g/dL   Globulin, Total 2.9 1.5 - 4.5 g/dL   Bilirubin Total 0.6 0.0 - 1.2 mg/dL   Alkaline Phosphatase 160 (H) 44 - 121 IU/L   AST 26 0 - 40 IU/L   ALT 30 0 - 44 IU/L  Lipid Profile   Collection Time: 01/13/24 10:23 AM  Result Value Ref Range   Cholesterol, Total 92 (L) 100 - 199 mg/dL   Triglycerides 60 0 - 149 mg/dL   HDL 30 (L) >60 mg/dL   VLDL Cholesterol Cal 14 5 - 40 mg/dL   LDL Chol Calc (NIH) 48 0 - 99 mg/dL   Chol/HDL Ratio 3.1 0.0 - 5.0 ratio  B12   Collection Time: 01/13/24 10:23 AM  Result Value Ref Range   Vitamin B-12 1,093 232 - 1,245 pg/mL  CBC w/Diff   Collection Time: 01/13/24 10:23 AM  Result Value Ref Range   WBC 9.9 3.4 - 10.8 x10E3/uL   RBC 5.17 4.14 - 5.80 x10E6/uL   Hemoglobin 16.5 13.0 - 17.7 g/dL   Hematocrit 51.8 62.4 - 51.0 %   MCV 93 79 - 97 fL   MCH 31.9 26.6 - 33.0 pg   MCHC 34.3 31.5 - 35.7 g/dL   RDW 86.4 88.3 - 84.5 %   Platelets 224 150 - 450 x10E3/uL   Neutrophils 62 Not Estab. %   Lymphs 31 Not Estab. %   Monocytes 6 Not Estab. %   Eos 1 Not Estab. %   Basos 0 Not Estab. %   Neutrophils Absolute 6.0 1.4 - 7.0 x10E3/uL   Lymphocytes Absolute 3.1 0.7 - 3.1 x10E3/uL   Monocytes Absolute 0.6 0.1 - 0.9 x10E3/uL   EOS (ABSOLUTE) 0.1 0.0 - 0.4 x10E3/uL   Basophils Absolute 0.0 0.0 - 0.2 x10E3/uL   Immature Granulocytes 0 Not Estab. %   Immature Grans (Abs) 0.0 0.0 - 0.1 x10E3/uL  PSA   Collection  Time: 01/13/24 10:23 AM  Result Value Ref Range   Prostate Specific Ag, Serum 0.2 0.0 - 4.0 ng/mL      Assessment & Plan:   Problem List Items Addressed This Visit       Nervous and Auditory   Right hemiparesis (HCC)    Doing well.  Walks with a cane.  Followed by physical medicine for botox injections every 3 month.  Physical medicine also who manages his baclofen  and Keppra .      Relevant Orders   Comprehensive metabolic panel with GFR   Spastic hemiparesis affecting dominant side (HCC) - Primary   Chronic.  Continues with Physical Therapy at [place].  Receiving Botox injections every 3 months.  Last note received with Physical Medicine.      Relevant Orders   Comprehensive metabolic panel with GFR     Other   Hyperlipidemia   Chronic.  Controlled.  Continue with current medication regimen of Atorvastatin  daily.  Labs ordered today.  Return to clinic in 6 months for reevaluation.  Call sooner if concerns arise.       Relevant Medications   atorvastatin  (LIPITOR) 40 MG tablet   Other Relevant Orders   Lipid panel   Obesity (BMI 30-39.9)   Recommended eating smaller high protein, low fat meals more frequently and exercising 30 mins a day 5 times a week with a goal of 10-15lb weight loss in the next 3 months.       Other Visit Diagnoses       Encounter for hepatitis C screening test for low risk patient       Relevant Orders   Hepatitis C antibody     Flu vaccine need       Relevant Orders   Flu vaccine trivalent PF, 6mos and older(Flulaval,Afluria,Fluarix,Fluzone) (Completed)         Follow up plan: Return in about 6 months (around 01/12/2025) for Physical and Fasting labs, MW[facility].

## 2024-07-14 NOTE — Assessment & Plan Note (Signed)
 Chronic.  Continues with Physical Therapy at [facility].  Receiving Botox injections every 3 months.  Last note received with Physical Medicine.

## 2024-07-14 NOTE — Assessment & Plan Note (Signed)
 Chronic.  Controlled.  Continue with current medication regimen of Atorvastatin daily.  Labs ordered today.  Return to clinic in 6 months for reevaluation.  Call sooner if concerns arise.

## 2024-07-14 NOTE — Assessment & Plan Note (Signed)
 Recommended eating smaller high protein, low fat meals more frequently and exercising 30 mins a day 5 times a week with a goal of 10-15lb weight loss in the next 3 months.

## 2024-07-14 NOTE — Assessment & Plan Note (Signed)
 Doing well.  Walks with a cane.  Followed by physical medicine for botox injections every 3 month.  Physical medicine also who manages his baclofen  and Keppra .

## 2024-07-15 LAB — COMPREHENSIVE METABOLIC PANEL WITH GFR
ALT: 45 IU/L — ABNORMAL HIGH (ref 0–44)
AST: 31 IU/L (ref 0–40)
Albumin: 4.5 g/dL (ref 4.1–5.1)
Alkaline Phosphatase: 147 IU/L — ABNORMAL HIGH (ref 47–123)
BUN/Creatinine Ratio: 10 (ref 9–20)
BUN: 9 mg/dL (ref 6–24)
Bilirubin Total: 1 mg/dL (ref 0.0–1.2)
CO2: 25 mmol/L (ref 20–29)
Calcium: 9.5 mg/dL (ref 8.7–10.2)
Chloride: 99 mmol/L (ref 96–106)
Creatinine, Ser: 0.94 mg/dL (ref 0.76–1.27)
Globulin, Total: 2.8 g/dL (ref 1.5–4.5)
Glucose: 74 mg/dL (ref 70–99)
Potassium: 4.5 mmol/L (ref 3.5–5.2)
Sodium: 140 mmol/L (ref 134–144)
Total Protein: 7.3 g/dL (ref 6.0–8.5)
eGFR: 104 mL/min/1.73 (ref >59)

## 2024-07-15 LAB — LIPID PANEL
Chol/HDL Ratio: 2.9 ratio (ref 0.0–5.0)
Cholesterol, Total: 113 mg/dL (ref 100–199)
HDL: 39 mg/dL — ABNORMAL LOW (ref >39)
LDL Chol Calc (NIH): 58 mg/dL (ref 0–99)
Triglycerides: 76 mg/dL (ref 0–149)
VLDL Cholesterol Cal: 16 mg/dL (ref 5–40)

## 2024-07-15 LAB — HEPATITIS C ANTIBODY: Hep C Virus Ab: NONREACTIVE

## 2024-07-17 ENCOUNTER — Ambulatory Visit: Payer: Self-pay | Admitting: Nurse Practitioner

## 2024-09-13 ENCOUNTER — Encounter: Admitting: Physical Medicine & Rehabilitation

## 2024-09-13 ENCOUNTER — Encounter: Payer: Self-pay | Admitting: Physical Medicine & Rehabilitation

## 2024-09-13 VITALS — BP 149/93 | HR 78 | Ht 67.0 in | Wt 273.0 lb

## 2024-09-13 DIAGNOSIS — G8110 Spastic hemiplegia affecting unspecified side: Secondary | ICD-10-CM | POA: Diagnosis not present

## 2024-09-13 MED ORDER — ABOBOTULINUMTOXINA 500 UNITS IM SOLR
500.0000 [IU] | Freq: Once | INTRAMUSCULAR | Status: AC
  Administered 2024-09-13: 500 [IU] via INTRAMUSCULAR

## 2024-09-13 MED ORDER — ABOBOTULINUMTOXINA 300 UNITS IM SOLR
300.0000 [IU] | Freq: Once | INTRAMUSCULAR | Status: AC
  Administered 2024-09-13: 300 [IU] via INTRAMUSCULAR

## 2024-09-13 NOTE — Progress Notes (Signed)
 Dysport  Injection for spasticity using needle EMG guidance Indication:  Spastic hemiparesis affecting dominant side (HCC)   Dilution: 500 Units/2ml        Total Units Injected:  800 Indication: Severe spasticity which interferes with ADL,mobility and/or  hygiene and is unresponsive to medication management and other conservative care Informed consent was obtained after describing risks and benefits of the procedure with the patient. This includes bleeding, bruising, infection, excessive weakness, or medication side effects. A REMS form is on file and signed.  Right side Needle: 50mm injectable monopolar needle electrode  Number of units per muscle Pectoralis Major 0 units Pectoralis Minor 0 units Biceps 150 units Brachioradialis 100 units FCR 50 units FCU 50 units FDS 150 units FDP 150 units FPL 0 units Pronator Teres 150 units Pronator Quadratus 0 units Lumbricals 0 units  All injections were done after obtaining appropriate EMG activity and after negative drawback for blood. The patient tolerated the procedure well. Post procedure instructions were given.

## 2024-09-13 NOTE — Patient Instructions (Signed)
 ALWAYS FEEL FREE TO CALL OUR OFFICE WITH ANY PROBLEMS OR QUESTIONS [phone])  **PLEASE NOTE** ALL MEDICATION REFILL REQUESTS (INCLUDING CONTROLLED SUBSTANCES) NEED TO BE MADE AT LEAST 7 DAYS PRIOR TO REFILL BEING DUE. ANY REFILL REQUESTS INSIDE THAT TIME FRAME MAY RESULT IN DELAYS IN RECEIVING YOUR PRESCRIPTION.                     KEEP WORKING ON YOUR STRETCHES!!

## 2024-12-13 ENCOUNTER — Encounter: Admitting: Physical Medicine & Rehabilitation

## 2024-12-19 ENCOUNTER — Ambulatory Visit

## 2025-01-15 ENCOUNTER — Encounter: Admitting: Nurse Practitioner

## 2025-02-01 ENCOUNTER — Ambulatory Visit: Admitting: Adult Health
